# Patient Record
Sex: Male | Born: 1935
Health system: Southern US, Community
[De-identification: ages and names within clinical notes are randomized; demographics above are authoritative.]

## PROBLEM LIST (undated history)

## (undated) DIAGNOSIS — K219 Gastro-esophageal reflux disease without esophagitis: Secondary | ICD-10-CM

## (undated) DIAGNOSIS — H9319 Tinnitus, unspecified ear: Secondary | ICD-10-CM

## (undated) DIAGNOSIS — N4340 Spermatocele of epididymis, unspecified: Secondary | ICD-10-CM

## (undated) DIAGNOSIS — R413 Other amnesia: Secondary | ICD-10-CM

## (undated) DIAGNOSIS — F419 Anxiety disorder, unspecified: Secondary | ICD-10-CM

## (undated) DIAGNOSIS — I6381 Other cerebral infarction due to occlusion or stenosis of small artery: Secondary | ICD-10-CM

## (undated) DIAGNOSIS — F329 Major depressive disorder, single episode, unspecified: Secondary | ICD-10-CM

## (undated) DIAGNOSIS — D649 Anemia, unspecified: Secondary | ICD-10-CM

## (undated) DIAGNOSIS — I2510 Atherosclerotic heart disease of native coronary artery without angina pectoris: Secondary | ICD-10-CM

## (undated) DIAGNOSIS — M059 Rheumatoid arthritis with rheumatoid factor, unspecified: Secondary | ICD-10-CM

## (undated) DIAGNOSIS — Z860100 Personal history of colon polyps, unspecified: Secondary | ICD-10-CM

## (undated) DIAGNOSIS — I10 Essential (primary) hypertension: Secondary | ICD-10-CM

## (undated) DIAGNOSIS — R972 Elevated prostate specific antigen [PSA]: Secondary | ICD-10-CM

## (undated) DIAGNOSIS — I96 Gangrene, not elsewhere classified: Secondary | ICD-10-CM

## (undated) DIAGNOSIS — F410 Panic disorder [episodic paroxysmal anxiety] without agoraphobia: Secondary | ICD-10-CM

## (undated) DIAGNOSIS — E669 Obesity, unspecified: Secondary | ICD-10-CM

## (undated) DIAGNOSIS — K5790 Diverticulosis of intestine, part unspecified, without perforation or abscess without bleeding: Secondary | ICD-10-CM

## (undated) DIAGNOSIS — R251 Tremor, unspecified: Secondary | ICD-10-CM

## (undated) DIAGNOSIS — F32A Depression, unspecified: Secondary | ICD-10-CM

## (undated) DIAGNOSIS — C649 Malignant neoplasm of unspecified kidney, except renal pelvis: Secondary | ICD-10-CM

## (undated) DIAGNOSIS — B001 Herpesviral vesicular dermatitis: Secondary | ICD-10-CM

## (undated) DIAGNOSIS — G4700 Insomnia, unspecified: Secondary | ICD-10-CM

## (undated) DIAGNOSIS — E7800 Pure hypercholesterolemia, unspecified: Secondary | ICD-10-CM

## (undated) DIAGNOSIS — N189 Chronic kidney disease, unspecified: Secondary | ICD-10-CM

## (undated) DIAGNOSIS — C61 Malignant neoplasm of prostate: Secondary | ICD-10-CM

## (undated) DIAGNOSIS — M069 Rheumatoid arthritis, unspecified: Secondary | ICD-10-CM

## (undated) DIAGNOSIS — N400 Enlarged prostate without lower urinary tract symptoms: Secondary | ICD-10-CM

## (undated) HISTORY — DX: Depression, unspecified: F32.A

## (undated) HISTORY — DX: Atherosclerotic heart disease of native coronary artery without angina pectoris: I25.10

## (undated) HISTORY — DX: Personal history of colonic polyps: Z86.010

## (undated) HISTORY — DX: Malignant neoplasm of prostate: C61

## (undated) HISTORY — PX: COLONOSCOPY: SHX174

## (undated) HISTORY — DX: Herpesviral vesicular dermatitis: B00.1

## (undated) HISTORY — PX: BACK SURGERY: SHX140

## (undated) HISTORY — DX: Obesity, unspecified: E66.9

## (undated) HISTORY — DX: Major depressive disorder, single episode, unspecified: F32.9

## (undated) HISTORY — DX: Other cerebral infarction due to occlusion or stenosis of small artery: I63.81

## (undated) HISTORY — DX: Panic disorder (episodic paroxysmal anxiety): F41.0

## (undated) HISTORY — DX: Anxiety disorder, unspecified: F41.9

## (undated) HISTORY — DX: Anemia, unspecified: D64.9

## (undated) HISTORY — DX: Tinnitus, unspecified ear: H93.19

## (undated) HISTORY — DX: Personal history of colon polyps, unspecified: Z86.0100

## (undated) HISTORY — DX: Rheumatoid arthritis with rheumatoid factor, unspecified: M05.9

## (undated) HISTORY — DX: Tremor, unspecified: R25.1

## (undated) HISTORY — PX: AMPUTATION TOE: SHX6595

## (undated) HISTORY — DX: Chronic kidney disease, unspecified: N18.9

## (undated) HISTORY — DX: Insomnia, unspecified: G47.00

## (undated) HISTORY — DX: Spermatocele of epididymis, unspecified: N43.40

## (undated) HISTORY — DX: Diverticulosis of intestine, part unspecified, without perforation or abscess without bleeding: K57.90

## (undated) HISTORY — DX: Elevated prostate specific antigen (PSA): R97.20

---

## 1998-05-16 ENCOUNTER — Emergency Department (HOSPITAL_COMMUNITY): Admission: EM | Admit: 1998-05-16 | Discharge: 1998-05-16 | Payer: Self-pay | Admitting: Emergency Medicine

## 1998-09-20 ENCOUNTER — Ambulatory Visit (HOSPITAL_COMMUNITY): Admission: RE | Admit: 1998-09-20 | Discharge: 1998-09-20 | Payer: Self-pay | Admitting: Internal Medicine

## 1998-09-20 ENCOUNTER — Encounter: Payer: Self-pay | Admitting: Internal Medicine

## 1998-10-08 HISTORY — PX: NEPHRECTOMY: SHX65

## 1998-10-11 ENCOUNTER — Encounter: Payer: Self-pay | Admitting: Urology

## 1998-10-13 ENCOUNTER — Inpatient Hospital Stay (HOSPITAL_COMMUNITY): Admission: RE | Admit: 1998-10-13 | Discharge: 1998-10-17 | Payer: Self-pay | Admitting: Urology

## 1998-11-02 ENCOUNTER — Emergency Department (HOSPITAL_COMMUNITY): Admission: EM | Admit: 1998-11-02 | Discharge: 1998-11-02 | Payer: Self-pay | Admitting: Emergency Medicine

## 1999-09-17 ENCOUNTER — Emergency Department (HOSPITAL_COMMUNITY): Admission: EM | Admit: 1999-09-17 | Discharge: 1999-09-17 | Payer: Self-pay | Admitting: Emergency Medicine

## 1999-09-17 ENCOUNTER — Encounter: Payer: Self-pay | Admitting: Emergency Medicine

## 1999-10-12 ENCOUNTER — Ambulatory Visit (HOSPITAL_COMMUNITY): Admission: RE | Admit: 1999-10-12 | Discharge: 1999-10-12 | Payer: Self-pay | Admitting: Cardiology

## 1999-11-15 ENCOUNTER — Encounter: Admission: RE | Admit: 1999-11-15 | Discharge: 1999-11-15 | Payer: Self-pay | Admitting: Urology

## 1999-11-15 ENCOUNTER — Encounter: Payer: Self-pay | Admitting: Urology

## 2000-05-16 ENCOUNTER — Encounter: Payer: Self-pay | Admitting: Urology

## 2000-05-16 ENCOUNTER — Encounter: Admission: RE | Admit: 2000-05-16 | Discharge: 2000-05-16 | Payer: Self-pay | Admitting: Urology

## 2000-11-15 ENCOUNTER — Encounter: Admission: RE | Admit: 2000-11-15 | Discharge: 2000-11-15 | Payer: Self-pay | Admitting: Urology

## 2000-11-15 ENCOUNTER — Encounter: Payer: Self-pay | Admitting: Urology

## 2000-11-20 ENCOUNTER — Encounter: Payer: Self-pay | Admitting: Internal Medicine

## 2000-11-20 ENCOUNTER — Encounter: Admission: RE | Admit: 2000-11-20 | Discharge: 2000-11-20 | Payer: Self-pay | Admitting: Internal Medicine

## 2001-02-20 ENCOUNTER — Encounter: Payer: Self-pay | Admitting: Emergency Medicine

## 2001-02-20 ENCOUNTER — Emergency Department (HOSPITAL_COMMUNITY): Admission: EM | Admit: 2001-02-20 | Discharge: 2001-02-20 | Payer: Self-pay | Admitting: Emergency Medicine

## 2001-05-23 ENCOUNTER — Encounter: Admission: RE | Admit: 2001-05-23 | Discharge: 2001-05-23 | Payer: Self-pay | Admitting: Urology

## 2001-05-23 ENCOUNTER — Encounter: Payer: Self-pay | Admitting: Urology

## 2001-11-18 ENCOUNTER — Encounter: Admission: RE | Admit: 2001-11-18 | Discharge: 2001-11-18 | Payer: Self-pay | Admitting: Urology

## 2001-11-18 ENCOUNTER — Encounter: Payer: Self-pay | Admitting: Urology

## 2001-12-11 ENCOUNTER — Ambulatory Visit (HOSPITAL_COMMUNITY): Admission: RE | Admit: 2001-12-11 | Discharge: 2001-12-11 | Payer: Self-pay | Admitting: *Deleted

## 2001-12-11 ENCOUNTER — Encounter (INDEPENDENT_AMBULATORY_CARE_PROVIDER_SITE_OTHER): Payer: Self-pay | Admitting: Specialist

## 2002-02-26 ENCOUNTER — Ambulatory Visit (HOSPITAL_COMMUNITY): Admission: RE | Admit: 2002-02-26 | Discharge: 2002-02-26 | Payer: Self-pay | Admitting: *Deleted

## 2002-02-26 ENCOUNTER — Encounter (INDEPENDENT_AMBULATORY_CARE_PROVIDER_SITE_OTHER): Payer: Self-pay | Admitting: Specialist

## 2002-06-09 ENCOUNTER — Encounter: Payer: Self-pay | Admitting: Urology

## 2002-06-09 ENCOUNTER — Encounter: Admission: RE | Admit: 2002-06-09 | Discharge: 2002-06-09 | Payer: Self-pay | Admitting: Urology

## 2003-04-28 ENCOUNTER — Ambulatory Visit (HOSPITAL_COMMUNITY): Admission: RE | Admit: 2003-04-28 | Discharge: 2003-04-28 | Payer: Self-pay | Admitting: Urology

## 2003-04-28 ENCOUNTER — Encounter: Payer: Self-pay | Admitting: Urology

## 2006-02-07 ENCOUNTER — Encounter: Admission: RE | Admit: 2006-02-07 | Discharge: 2006-02-07 | Payer: Self-pay | Admitting: Internal Medicine

## 2009-10-24 ENCOUNTER — Encounter: Admission: RE | Admit: 2009-10-24 | Discharge: 2009-10-24 | Payer: Self-pay | Admitting: Internal Medicine

## 2009-12-20 ENCOUNTER — Emergency Department (HOSPITAL_COMMUNITY): Admission: EM | Admit: 2009-12-20 | Discharge: 2009-12-20 | Payer: Self-pay | Admitting: Emergency Medicine

## 2010-01-06 ENCOUNTER — Encounter: Admission: RE | Admit: 2010-01-06 | Discharge: 2010-01-06 | Payer: Self-pay | Admitting: Gastroenterology

## 2010-02-28 ENCOUNTER — Encounter: Admission: RE | Admit: 2010-02-28 | Discharge: 2010-02-28 | Payer: Self-pay | Admitting: Gastroenterology

## 2010-04-13 ENCOUNTER — Emergency Department (HOSPITAL_COMMUNITY): Admission: EM | Admit: 2010-04-13 | Discharge: 2010-04-14 | Payer: Self-pay | Admitting: Emergency Medicine

## 2010-10-29 ENCOUNTER — Encounter: Payer: Self-pay | Admitting: Cardiology

## 2010-12-24 LAB — POCT CARDIAC MARKERS
CKMB, poc: <1 ng/mL — ABNORMAL LOW (ref 1.0–8.0)
Myoglobin, poc: 128 ng/mL (ref 12–200)

## 2010-12-24 LAB — CBC
Hemoglobin: 11.8 g/dL — ABNORMAL LOW (ref 13.0–17.0)
MCHC: 34.3 g/dL (ref 30.0–36.0)
Platelets: 151 10*3/uL (ref 150–400)
RBC: 3.67 MIL/uL — ABNORMAL LOW (ref 4.22–5.81)
WBC: 6.4 10*3/uL (ref 4.0–10.5)

## 2010-12-24 LAB — COMPREHENSIVE METABOLIC PANEL
ALT: 15 U/L (ref 0–53)
Alkaline Phosphatase: 51 U/L (ref 39–117)
BUN: 18 mg/dL (ref 6–23)
Calcium: 9.4 mg/dL (ref 8.4–10.5)
Chloride: 106 mEq/L (ref 96–112)
Creatinine, Ser: 1.92 mg/dL — ABNORMAL HIGH (ref 0.4–1.5)
Glucose, Bld: 103 mg/dL — ABNORMAL HIGH (ref 70–99)
Total Bilirubin: 0.4 mg/dL (ref 0.3–1.2)

## 2010-12-24 LAB — DIFFERENTIAL
Lymphocytes Relative: 19 % (ref 12–46)
Lymphs Abs: 1.2 10*3/uL (ref 0.7–4.0)
Monocytes Absolute: 0.7 10*3/uL (ref 0.1–1.0)
Neutro Abs: 4.1 10*3/uL (ref 1.7–7.7)

## 2010-12-24 LAB — LIPASE, BLOOD: Lipase: 40 U/L (ref 11–59)

## 2011-01-01 LAB — CBC
Hemoglobin: 11.1 g/dL — ABNORMAL LOW (ref 13.0–17.0)
MCHC: 34.8 g/dL (ref 30.0–36.0)
MCV: 95.1 fL (ref 78.0–100.0)
RDW: 14 % (ref 11.5–15.5)
WBC: 7.4 10*3/uL (ref 4.0–10.5)

## 2011-01-01 LAB — POCT I-STAT, CHEM 8
HCT: 32 % — ABNORMAL LOW (ref 39.0–52.0)
Potassium: 4 mEq/L (ref 3.5–5.1)
TCO2: 24 mmol/L (ref 0–100)

## 2011-01-01 LAB — POCT CARDIAC MARKERS: Myoglobin, poc: 167 ng/mL (ref 12–200)

## 2011-01-01 LAB — DIFFERENTIAL
Basophils Absolute: 0 10*3/uL (ref 0.0–0.1)
Eosinophils Absolute: 0.5 10*3/uL (ref 0.0–0.7)
Eosinophils Relative: 7 % — ABNORMAL HIGH (ref 0–5)
Lymphs Abs: 2.4 10*3/uL (ref 0.7–4.0)
Monocytes Relative: 10 % (ref 3–12)
Neutrophils Relative %: 50 % (ref 43–77)

## 2011-01-01 LAB — APTT: aPTT: 30 seconds (ref 24–37)

## 2011-01-01 LAB — BASIC METABOLIC PANEL
CO2: 25 mEq/L (ref 19–32)
Calcium: 9.3 mg/dL (ref 8.4–10.5)
Sodium: 138 mEq/L (ref 135–145)

## 2011-01-01 LAB — PROTIME-INR: Prothrombin Time: 12.9 seconds (ref 11.6–15.2)

## 2011-02-23 NOTE — Procedures (Signed)
[facility]  Patient:    Derrick [name], Derrick [name] Visit Number: [id number] MRN: [id number]          Service Type: END Location: ENDO Attending Physician:  Sabino Gasser Dictated by:   Sabino Gasser, M.D. Proc. Date: 12/11/01 Admit Date:  12/11/2001                             Procedure Report  PROCEDURE:  Colonoscopy.  INDICATIONS:  Colon polyps.  ANESTHESIA:  Demerol 20, Versed 4 additional mg.  DESCRIPTION OF PROCEDURE:  With the patient mildly sedated in the left lateral decubitus position, a rectal exam was performed which was unremarkable. Subsequently, the Olympus videoscopic colonoscope was inserted into the rectum and passed easily under direct vision to the cecum, identified by the ileocecal valve and appendiceal orifice, both of which were photographed. From this point, the colonoscope was then slowly withdrawn, taking circumferential views of the entire colonic mucosa, stopping then only in the rectum which appeared initially normal on direct, and retroflexed view showed some hemorrhoidal tissue.  As we restraightened the endoscope, a small polyps was seen, photographed, and subsequently removed using hot biopsy forceps technique setting of 20-20 blended current. The endoscope was withdrawn.  The patients vital signs and pulse oximeter remained stable.  The patient tolerated the procedure well without apparent complications.  FINDINGS:  Polyp in the rectum.  PLAN:  Await biopsy report.  The patient will call me for results and follow up with me as an outpatient.  The hemorrhoids we will treat as needed. Dictated by:   Sabino Gasser, M.D. Attending Physician:  Sabino Gasser DD:  12/11/01 TD:  12/11/01 Job: [id number]

## 2011-02-23 NOTE — Procedures (Signed)
[facility]  Patient:    Derrick [name], Derrick [name] Visit Number: [id number] MRN: [id number]          Service Type: END Location: ENDO Attending Physician:  Sabino Gasser Dictated by:   Sabino Gasser, M.D. Proc. Date: 02/26/02 Admit Date:  02/26/2002                             Procedure Report  PROCEDURE:  Upper endoscopy.  INDICATION FOR PROCEDURE:  Follow-up with gastric ulcer.  ANESTHESIA:  Demerol 50, Versed 5 mg.  DESCRIPTION OF PROCEDURE:  With the patient mildly sedated in the left lateral decubitus position, the Olympus videoscopic endoscope was inserted in the mouth and passed under direct vision through the esophagus which appeared normal except for a questionable area of short segment Barretts esophagus which we had attempted to photograph and biopsy. We entered into the stomach. The fundus, body, antrum, duodenal bulb and second portion of the duodenum were visualized. From this point, the endoscope was slowly withdrawn taking circumferential views of the entire duodenal mucosa until the endoscope was then pulled back into the stomach, placed in retroflexion to view the stomach from below. The endoscope was then straightened and withdrawn taking circumferential views of the remaining gastric and esophageal mucosa stopping in the body of the stomach where erythema is seen and a measles-like distribution was seen, photographed and biopsied. The endoscope was withdrawn. The patients vital signs and pulse oximeter remained stable. The patient tolerated the procedure well without apparent complications.  FINDINGS:  Changes of erythema of body of stomach. Await biopsy report. Question of short segment Barretts esophagus. Again await biopsy report. The patient will call me for results and followup with me as an outpatient. Dictated by:   Sabino Gasser, M.D. Attending Physician:  Sabino Gasser DD:  02/26/02 TD:  02/28/02 Job: [id number]

## 2011-02-23 NOTE — Procedures (Signed)
[facility]  Patient:    Derrick [name], Derrick [name] Visit Number: [id number] MRN: [id number]          Service Type: END Location: ENDO Attending Physician:  Sabino Gasser Dictated by:   Sabino Gasser, M.D. Proc. Date: 12/11/01 Admit Date:  12/11/2001                             Procedure Report  PROCEDURE:  Upper endoscopy.  INDICATION:  GERD.  ANESTHESIA:  Demerol 40, Versed 6 mg.  DESCRIPTION OF PROCEDURE:  With patient mildly sedated in the left lateral decubitus position, the Olympus videoscopic endoscope was inserted into the mouth and passed under direct vision through the esophagus which appeared normal, into the stomach.  Fundus, body, and antrum were visualized, and diffuse erythema was seen consistent with a gastritis.  Duodenal bulb, second portion of duodenum appeared normal.  From this point, the endoscope was slowly withdrawn, taking circumferential views of the entire duodenal mucosa until the endoscope then pulled back into the stomach and placed in retroflexion to view the stomach from below.  This was photographed.  The endoscope was then straightened and withdrawn, taking circumferential views of the remaining gastric and esophageal mucosa, stopping in the body and fundus specifically to biopsy the diffuse erythematous changes noted.  The endoscope was withdrawn.  The patients vital signs and pulse oximeter remained stable. The patient tolerated the procedure well without apparent complications.  FINDINGS:  Diffuse gastritis mostly in body and fundus but also somewhat in the antrum.  PLAN:  Await biopsy report.  The patient will call me for results and follow up with me as an outpatient.  Proceed to colonoscopy as planned. Dictated by:   Sabino Gasser, M.D. Attending Physician:  Sabino Gasser DD:  12/11/01 TD:  12/11/01 Job: [id number]

## 2011-11-18 ENCOUNTER — Encounter (HOSPITAL_COMMUNITY): Payer: Self-pay | Admitting: Emergency Medicine

## 2011-11-18 ENCOUNTER — Emergency Department (HOSPITAL_COMMUNITY): Payer: Medicare Other

## 2011-11-18 ENCOUNTER — Inpatient Hospital Stay (HOSPITAL_COMMUNITY)
Admission: EM | Admit: 2011-11-18 | Discharge: 2011-11-20 | DRG: 287 | Disposition: A | Discharge status: Home / Self Care | Payer: Medicare Other | Attending: Internal Medicine | Admitting: Internal Medicine

## 2011-11-18 DIAGNOSIS — N183 Chronic kidney disease, stage 3 unspecified: Secondary | ICD-10-CM | POA: Diagnosis present

## 2011-11-18 DIAGNOSIS — I200 Unstable angina: Secondary | ICD-10-CM | POA: Diagnosis present

## 2011-11-18 DIAGNOSIS — K219 Gastro-esophageal reflux disease without esophagitis: Secondary | ICD-10-CM | POA: Diagnosis present

## 2011-11-18 DIAGNOSIS — E7800 Pure hypercholesterolemia, unspecified: Secondary | ICD-10-CM

## 2011-11-18 DIAGNOSIS — D696 Thrombocytopenia, unspecified: Secondary | ICD-10-CM | POA: Diagnosis present

## 2011-11-18 DIAGNOSIS — N400 Enlarged prostate without lower urinary tract symptoms: Secondary | ICD-10-CM | POA: Diagnosis present

## 2011-11-18 DIAGNOSIS — Z7982 Long term (current) use of aspirin: Secondary | ICD-10-CM

## 2011-11-18 DIAGNOSIS — Z79899 Other long term (current) drug therapy: Secondary | ICD-10-CM

## 2011-11-18 DIAGNOSIS — I10 Essential (primary) hypertension: Secondary | ICD-10-CM | POA: Diagnosis present

## 2011-11-18 DIAGNOSIS — R079 Chest pain, unspecified: Secondary | ICD-10-CM

## 2011-11-18 DIAGNOSIS — R9431 Abnormal electrocardiogram [ECG] [EKG]: Secondary | ICD-10-CM

## 2011-11-18 DIAGNOSIS — Z85528 Personal history of other malignant neoplasm of kidney: Secondary | ICD-10-CM

## 2011-11-18 DIAGNOSIS — D649 Anemia, unspecified: Secondary | ICD-10-CM | POA: Diagnosis present

## 2011-11-18 DIAGNOSIS — I129 Hypertensive chronic kidney disease with stage 1 through stage 4 chronic kidney disease, or unspecified chronic kidney disease: Secondary | ICD-10-CM | POA: Diagnosis present

## 2011-11-18 DIAGNOSIS — N189 Chronic kidney disease, unspecified: Secondary | ICD-10-CM | POA: Diagnosis present

## 2011-11-18 DIAGNOSIS — R0789 Other chest pain: Principal | ICD-10-CM | POA: Diagnosis present

## 2011-11-18 DIAGNOSIS — E785 Hyperlipidemia, unspecified: Secondary | ICD-10-CM | POA: Diagnosis present

## 2011-11-18 DIAGNOSIS — I2510 Atherosclerotic heart disease of native coronary artery without angina pectoris: Secondary | ICD-10-CM | POA: Diagnosis present

## 2011-11-18 HISTORY — DX: Benign prostatic hyperplasia without lower urinary tract symptoms: N40.0

## 2011-11-18 HISTORY — DX: Gastro-esophageal reflux disease without esophagitis: K21.9

## 2011-11-18 HISTORY — DX: Malignant neoplasm of unspecified kidney, except renal pelvis: C64.9

## 2011-11-18 HISTORY — DX: Essential (primary) hypertension: I10

## 2011-11-18 HISTORY — DX: Pure hypercholesterolemia, unspecified: E78.00

## 2011-11-18 LAB — HEPATIC FUNCTION PANEL
AST: 20 U/L (ref 0–37)
Albumin: 3.6 g/dL (ref 3.5–5.2)
Alkaline Phosphatase: 49 U/L (ref 39–117)
Total Bilirubin: 0.4 mg/dL (ref 0.3–1.2)
Total Protein: 7 g/dL (ref 6.0–8.3)

## 2011-11-18 LAB — CBC
HCT: 37.1 % — ABNORMAL LOW (ref 39.0–52.0)
MCHC: 34 g/dL (ref 30.0–36.0)
WBC: 5.8 10*3/uL (ref 4.0–10.5)

## 2011-11-18 LAB — DIFFERENTIAL
Basophils Relative: 0 % (ref 0–1)
Eosinophils Absolute: 0.3 10*3/uL (ref 0.0–0.7)
Lymphocytes Relative: 24 % (ref 12–46)
Lymphs Abs: 1.4 10*3/uL (ref 0.7–4.0)
Monocytes Relative: 8 % (ref 3–12)
Neutrophils Relative %: 63 % (ref 43–77)

## 2011-11-18 LAB — BASIC METABOLIC PANEL
BUN: 18 mg/dL (ref 6–23)
CO2: 23 mEq/L (ref 19–32)
Chloride: 103 mEq/L (ref 96–112)
GFR calc Af Amer: 43 mL/min — ABNORMAL LOW (ref >90)
GFR calc non Af Amer: 37 mL/min — ABNORMAL LOW (ref >90)
Potassium: 3.9 mEq/L (ref 3.5–5.1)

## 2011-11-18 LAB — URINALYSIS, ROUTINE W REFLEX MICROSCOPIC
Bilirubin Urine: NEGATIVE
Leukocytes, UA: NEGATIVE
Protein, ur: NEGATIVE mg/dL
Specific Gravity, Urine: 1.011 (ref 1.005–1.030)
Urobilinogen, UA: 0.2 mg/dL (ref 0.0–1.0)

## 2011-11-18 LAB — CARDIAC PANEL(CRET KIN+CKTOT+MB+TROPI)
CK, MB: 3.2 ng/mL (ref 0.3–4.0)
Total CK: 116 U/L (ref 7–232)

## 2011-11-18 MED ORDER — HEPARIN BOLUS VIA INFUSION
4000.0000 [IU] | Freq: Once | INTRAVENOUS | Status: AC
  Administered 2011-11-18: 4000 [IU] via INTRAVENOUS

## 2011-11-18 MED ORDER — HEPARIN SOD (PORCINE) IN D5W 100 UNIT/ML IV SOLN
1400.0000 [IU]/h | INTRAVENOUS | Status: DC
  Administered 2011-11-18: 1100 [IU]/h via INTRAVENOUS
  Filled 2011-11-18 (×4): qty 250

## 2011-11-18 MED ORDER — FINASTERIDE 5 MG PO TABS
5.0000 mg | ORAL_TABLET | Freq: Every day | ORAL | Status: DC
  Administered 2011-11-19 – 2011-11-20 (×2): 5 mg via ORAL
  Filled 2011-11-18 (×2): qty 1

## 2011-11-18 MED ORDER — SODIUM CHLORIDE 0.9 % IV SOLN
INTRAVENOUS | Status: DC
  Administered 2011-11-18: 10 mL/h via INTRAVENOUS

## 2011-11-18 MED ORDER — ONDANSETRON HCL 4 MG/2ML IJ SOLN: 4.0000 mg | Freq: Four times a day (QID) | INTRAMUSCULAR | Status: DC | PRN

## 2011-11-18 MED ORDER — DIAZEPAM 5 MG PO TABS
5.0000 mg | ORAL_TABLET | ORAL | Status: AC
  Administered 2011-11-18: 5 mg via ORAL
  Filled 2011-11-18: qty 1

## 2011-11-18 MED ORDER — NITROGLYCERIN 2 % TD OINT: 0.5000 [in_us] | TOPICAL_OINTMENT | Freq: Three times a day (TID) | TRANSDERMAL | Status: DC

## 2011-11-18 MED ORDER — ASPIRIN 81 MG PO CHEW
324.0000 mg | CHEWABLE_TABLET | Freq: Once | ORAL | Status: AC
Start: 2011-11-18 — End: 2011-11-18
  Administered 2011-11-18: 324 mg via ORAL
  Filled 2011-11-18: qty 4

## 2011-11-18 MED ORDER — SODIUM CHLORIDE 0.9 % IJ SOLN: 3.0000 mL | INTRAMUSCULAR | Status: DC | PRN

## 2011-11-18 MED ORDER — AMLODIPINE BESYLATE 5 MG PO TABS
5.0000 mg | ORAL_TABLET | Freq: Every day | ORAL | Status: DC
  Administered 2011-11-19 – 2011-11-20 (×2): 5 mg via ORAL
  Filled 2011-11-18 (×3): qty 1

## 2011-11-18 MED ORDER — SODIUM CHLORIDE 0.9 % IV SOLN
INTRAVENOUS | Status: DC
  Administered 2011-11-19: 04:00:00 via INTRAVENOUS

## 2011-11-18 MED ORDER — PANTOPRAZOLE SODIUM 40 MG PO TBEC
40.0000 mg | DELAYED_RELEASE_TABLET | Freq: Every day | ORAL | Status: DC
  Administered 2011-11-18: 40 mg via ORAL
  Filled 2011-11-18 (×2): qty 1

## 2011-11-18 MED ORDER — SODIUM CHLORIDE 0.9 % IJ SOLN
3.0000 mL | Freq: Two times a day (BID) | INTRAMUSCULAR | Status: DC
  Administered 2011-11-19: 3 mL via INTRAVENOUS

## 2011-11-18 MED ORDER — SODIUM CHLORIDE 0.9 % IV SOLN: 250.0000 mL | INTRAVENOUS | Status: DC | PRN

## 2011-11-18 MED ORDER — NITROGLYCERIN 0.4 MG/HR TD PT24
0.4000 mg | MEDICATED_PATCH | Freq: Every day | TRANSDERMAL | Status: DC
  Administered 2011-11-18 – 2011-11-19 (×2): 0.4 mg via TRANSDERMAL
  Filled 2011-11-18 (×2): qty 1

## 2011-11-18 MED ORDER — ACETAMINOPHEN 325 MG PO TABS: 650.0000 mg | ORAL_TABLET | ORAL | Status: DC | PRN

## 2011-11-18 MED ORDER — ASPIRIN EC 81 MG PO TBEC: 81.0000 mg | DELAYED_RELEASE_TABLET | Freq: Every day | ORAL | Status: DC

## 2011-11-18 MED ORDER — ROSUVASTATIN CALCIUM 5 MG PO TABS
5.0000 mg | ORAL_TABLET | Freq: Every day | ORAL | Status: DC
  Administered 2011-11-19 – 2011-11-20 (×2): 5 mg via ORAL
  Filled 2011-11-18 (×2): qty 1

## 2011-11-18 MED ORDER — ASPIRIN 81 MG PO CHEW
324.0000 mg | CHEWABLE_TABLET | ORAL | Status: AC
  Administered 2011-11-19: 324 mg via ORAL
  Filled 2011-11-18: qty 4

## 2011-11-18 MED ORDER — NITROGLYCERIN 0.4 MG SL SUBL
0.4000 mg | SUBLINGUAL_TABLET | SUBLINGUAL | Status: DC | PRN
  Administered 2011-11-18 (×2): 0.4 mg via SUBLINGUAL
  Filled 2011-11-18: qty 25

## 2011-11-18 NOTE — Consult Note (Signed)
[facility]  CARDIOLOGY CONSULTATION NOTE.  NAME:  Derrick [name]   MRN: [id number] DOB:  11-12-1931   ADMIT DATE: 11/18/2011  Reason for Consult: Chest pain - ACS  Requesting Physician: Lonia Blood, MD Primary Care Physician: Londell Moh, MD, MD  [facility] Primary Cardiologist: Marykay Lex, MD, MS  HPI: This is a 78 y.o. male with a past medical history significant for socially, renal cell carcinoma status post nephrectomy in 2000 with mild chronic renal insufficiency. Prior to today he notes increasing frequency/severity of exertional chest pain over the past few days, noting that  he may have been "slowing down" over the past few days walking the dog and but thought it was due to being out of shape since he had take a few days off of walking the dog.  -; pertinent negatives - claudication, dyspnea, fatigue, irregular heart beat, lower extremity edema, near-syncope, orthopnea, palpitations, paroxysmal nocturnal dyspnea, syncope and tachypnea. He was was in his usual state of health until this morning while at church she began noticing unusual discomfort in the left side of his chest going into his left arm that is just having nagging discomfort that was started off mild. He didn't feel right and then when he went home the it continued to get worse and began to feel more like a pressure more towards his left chest and still the strange sensation in his arm. He didn't really do much later he walked around to see if they get worse with exertion but after an hour or so of his continued symptoms he felt as though he need to be evaluated, so his wife brought him to the emergency room. His symptoms are relieved within 5 minutes of the second dose of nitroglycerin.  He noted that the episode began as I said about 11:00 in the morning lasted a few minutes, but then later on that evening home, the discomfort became worse to get about 8/10 and was  worse with deep inspiration feeling it with his hand on and under his left pectoral muscle. Since receiving the nitroglycerin in the emergency room he is not any further discomfort. Prior to arriving noted that the symptoms began slowly became progressively worse intermittently going away in a getting worse. As I mentioned he has been otherwise healthy with no recent illnesses.  PMHx:  CARDIAC HISTORY: He reports possibly having had a Heart Catheterization by Dr. Aleen Campi "a few years ago" (will need to check [facility] For records. Last Myoview 2007: Within normal limits. Echo: No reported Risk factors: Family History - CAD, Hypertension and Lipids Current cardiac medications: ACE inhibitor, ASA and HMG-CoA reductase inhibitor, dihydropyridine calcium channel blocker  Past Medical History  Diagnosis Date  . Hypertension   . High cholesterol   . GERD (gastroesophageal reflux disease)   . BPH (benign prostatic hyperplasia)   . Renal cell cancer     s/p nephrectomy in 2000   Past Surgical History  Procedure Date  . Nephrectomy 10/1998    FAMHx: Family History  Problem Relation Age of Onset  . Heart attack Father 3  . Kidney failure Sister     Bright's disease    SOCHx:  reports that he has quit smoking. He does not have any smokeless tobacco history on file. He reports that he does not drink alcohol or use illicit drugs.  ALLERGIES: No Known Allergies  ROS: Pertinent positives: chest pressure/discomfort, exertional chest pressure/discomfort and Shortness of breath A  comprehensive 10 point Review of Systems was negative except for: Ears, nose, mouth, throat, and face: positive for nasal congestion Respiratory: positive for cough and Occasional sensation of wheezing due to allergies Cardiovascular: positive for chest pressure/discomfort and exertional chest pressure/discomfort Gastrointestinal: positive for constipation, dyspepsia and reflux  symptoms Allergic/Immunologic: positive for Seasonal allergies   HOME MEDICATIONS: Prescriptions prior to admission  Medication Sig Dispense Refill  . amLODipine (NORVASC) 5 MG tablet Take 5 mg by mouth daily.      Marland Kitchen aspirin EC 81 MG tablet Take 81 mg by mouth daily.      . benazepril (LOTENSIN) 5 MG tablet Take 5 mg by mouth daily.      Marland Kitchen esomeprazole (NEXIUM) 40 MG capsule Take 40 mg by mouth 2 (two) times daily.      . finasteride (PROSCAR) 5 MG tablet Take 5 mg by mouth daily.      . fish oil-omega-3 fatty acids 1000 MG capsule Take 2 g by mouth 2 (two) times daily.      . rosuvastatin (CRESTOR) 5 MG tablet Take 5 mg by mouth daily.       HOSPITAL MEDICATIONS: I have reviewed the patient's current medications.  VITALS: Blood pressure 110/66, pulse 51, temperature 98.1 F (36.7 C), temperature source Oral, resp. rate 11, SpO2 97.00%. PHYSICAL EXAM: General appearance: alert, cooperative, appears stated age, no distress and Very pleasant mood and affect. Neck: no adenopathy, no carotid bruit, no JVD, supple, symmetrical, trachea midline and thyroid not enlarged, symmetric, no tenderness/mass/nodules Lungs: clear to auscultation bilaterally, normal percussion bilaterally and Nonlabored Heart: regular rate and rhythm, S1, S2 normal, no murmur, click, rub or gallop Abdomen: soft, non-tender; bowel sounds normal; no masses,  no organomegaly Extremities: extremities normal, atraumatic, no cyanosis or edema Pulses: 2+ and symmetric Skin: Skin color, texture, turgor normal. No rashes or lesions Neurologic: Mental status: Alert, oriented, thought content appropriate Cranial nerves: normal Motor: grossly normal  LABS: Results for orders placed during the hospital encounter of 11/18/11 (from the past 48 hour(s))  BASIC METABOLIC PANEL     Status: Abnormal   Collection Time   11/18/11  3:55 PM      Component Value Range Comment   Sodium 135  135 - 145 (mEq/L)    Potassium 3.9  3.5 -  5.1 (mEq/L)    Chloride 103  96 - 112 (mEq/L)    CO2 23  19 - 32 (mEq/L)    Glucose, Bld 132 (*) 70 - 99 (mg/dL)    BUN 18  6 - 23 (mg/dL)    Creatinine, Ser 2.13 (*) 0.50 - 1.35 (mg/dL)    Calcium 9.7  8.4 - 10.5 (mg/dL)    GFR calc non Af Amer 37 (*) >90 (mL/min)    GFR calc Af Amer 43 (*) >90 (mL/min)   CBC     Status: Abnormal   Collection Time   11/18/11  3:55 PM      Component Value Range Comment   WBC 5.8  4.0 - 10.5 (K/uL)    RBC 4.07 (*) 4.22 - 5.81 (MIL/uL)    Hemoglobin 12.6 (*) 13.0 - 17.0 (g/dL)    HCT 08.6 (*) 57.8 - 52.0 (%)    MCV 91.2  78.0 - 100.0 (fL)    MCH 31.0  26.0 - 34.0 (pg)    MCHC 34.0  30.0 - 36.0 (g/dL)    RDW 46.9  62.9 - 52.8 (%)    Platelets 135 (*) 150 - 400 (  K/uL)   DIFFERENTIAL     Status: Normal   Collection Time   11/18/11  3:55 PM      Component Value Range Comment   Neutrophils Relative 63  43 - 77 (%)    Neutro Abs 3.7  1.7 - 7.7 (K/uL)    Lymphocytes Relative 24  12 - 46 (%)    Lymphs Abs 1.4  0.7 - 4.0 (K/uL)    Monocytes Relative 8  3 - 12 (%)    Monocytes Absolute 0.5  0.1 - 1.0 (K/uL)    Eosinophils Relative 5  0 - 5 (%)    Eosinophils Absolute 0.3  0.0 - 0.7 (K/uL)    Basophils Relative 0  0 - 1 (%)    Basophils Absolute 0.0  0.0 - 0.1 (K/uL)   HEPATIC FUNCTION PANEL     Status: Normal   Collection Time   11/18/11  3:55 PM      Component Value Range Comment   Total Protein 7.0  6.0 - 8.3 (g/dL)    Albumin 3.6  3.5 - 5.2 (g/dL)    AST 20  0 - 37 (U/L)    ALT 23  0 - 53 (U/L)    Alkaline Phosphatase 49  39 - 117 (U/L)    Total Bilirubin 0.4  0.3 - 1.2 (mg/dL)    Bilirubin, Direct <1.6  0.0 - 0.3 (mg/dL)    Indirect Bilirubin NOT CALCULATED  0.3 - 0.9 (mg/dL)   LIPASE, BLOOD     Status: Normal   Collection Time   11/18/11  3:55 PM      Component Value Range Comment   Lipase 52  11 - 59 (U/L)   POCT I-STAT TROPONIN I     Status: Normal   Collection Time   11/18/11  4:07 PM      Component Value Range Comment   Troponin i,  poc 0.01  0.00 - 0.08 (ng/mL)    Comment 3            URINALYSIS, ROUTINE W REFLEX MICROSCOPIC     Status: Normal   Collection Time   11/18/11  5:13 PM      Component Value Range Comment   Color, Urine YELLOW  YELLOW     APPearance CLEAR  CLEAR     Specific Gravity, Urine 1.011  1.005 - 1.030     pH 6.5  5.0 - 8.0     Glucose, UA NEGATIVE  NEGATIVE (mg/dL)    Hgb urine dipstick NEGATIVE  NEGATIVE     Bilirubin Urine NEGATIVE  NEGATIVE     Ketones, ur NEGATIVE  NEGATIVE (mg/dL)    Protein, ur NEGATIVE  NEGATIVE (mg/dL)    Urobilinogen, UA 0.2  0.0 - 1.0 (mg/dL)    Nitrite NEGATIVE  NEGATIVE     Leukocytes, UA NEGATIVE  NEGATIVE  MICROSCOPIC NOT DONE ON URINES WITH NEGATIVE PROTEIN, BLOOD, LEUKOCYTES, NITRITE, OR GLUCOSE <1000 mg/dL.    IMAGING: Dg Chest 2 View  11/18/2011  *RADIOLOGY REPORT*  Clinical Data: Rule out infiltrate  CHEST - 2 VIEW  Comparison: 04/13/2010  Findings: Cardiomediastinal silhouette is stable.  No acute infiltrate or pleural effusion.  No pulmonary edema.  Mild degenerative changes mid and lower thoracic spine.  IMPRESSION: No active disease.  Mild degenerative changes thoracic spine.  Original Report Authenticated By: Natasha Mead, M.D.    IMPRESSION: Principal Problem:  *Unstable angina Active Problems:  Hypertension  High cholesterol  GERD (gastroesophageal reflux disease)  BPH (benign prostatic hyperplasia)  Chest pain at rest  Thrombocytopenia  Anemia  CKD (chronic kidney disease), stage III  My overall impression Mr. Mattioli symptoms are that they're quite classic symptoms for Unstable Angina/ACS. He does not have a family history of premature coronary artery disease but his brother whose effusion and a couple years ago had a heart attack and his father had an MI in his 55s. He has hypertension dyslipidemia and is a distant smoker. He does take daily aspirin. His ECG with T wave inversions leads V4 and V5 are concerning for ischemia although the kidney due  to LVH as there is a high voltage R waves in these leads. To calculate his TIMI risk score:  His risk factors include hypertension dyslipidemia and with a stretch family history as it was not CAD and a male less than 60. He takes daily aspirin he's had a couple episodes of angina in the last 24 hours and had ST deviation that was concerning for the dynamic T-wave inversions. This would make his TIMI risk score = 3.  We are still yet to see if he has any serum biomarker elevation.   Based on the classic nature of his anginal symptoms, his age and risk factors I feel that the most definitive to diagnose etiology of his chest discomfort would be to proceed with cardiac catheterization. I did spend at least 20 minutes talking to the patient about the risks benefits alternatives indications of the procedure especially in dorsi and the increased risk of contrast-induced nephropathy with his single kidney and chronic kidney disease. I do feel however that we can do diagnostic cardiac catheterization with minimal contrast ensuring units at hydration and be safe.  RECOMMENDATION: Cardiac cath to rule out ischemic CAD. Possible angioplasty. The procedure and risks described to patient including risk of CVA, MI, bleeding, emergency surgery, death, Contrast nephropathy. Risks / Complications include, but not limited to: Death, MI, CVA/TIA, VF/VT (with defibrillation), Bradycardia (need for temporary pacer placement), contrast induced nephropathy*, bleeding / bruising / hematoma / pseudoaneurysm, vascular or coronary injury (with possible emergent CT or Vascular Surgery), adverse medication reactions, infection.   The patient voices understanding and agree to proceed.     Agree with aspirin, IV heparin infusion for ACS protocol, statin. Continue ACE inhibitor. Beta blockers withheld because history of bradycardia.    Will make n.p.o. after midnight (may have a light breakfast) we'll plan to proceed with possible  catheterization tomorrow afternoon.  We'll order 2-D echocardiogram for [facility] to read in order to avoid the need for LV gram.   If cardiac catheterization demonstrate coronary disease, we will take the patient on the [facility].  We may need to consider staged diagnostic followed by interventional procedure to avoid excess contrast.  Time Spent Directly with Patient: 45 minutes  Brandye Inthavong W, M.D., M.S. [facility] [address] [place], [place]  [address]  [phone]  11/18/2011 8:17 PM   cc:   Dr. Corliss Skains; Londell Moh, MD, MD

## 2011-11-18 NOTE — ED Notes (Signed)
Return from xray

## 2011-11-18 NOTE — ED Notes (Signed)
Dr Lavera Guise here

## 2011-11-18 NOTE — H&P (Signed)
PCP:   Londell Moh, MD, MD   Chief Complaint:  Chest pain    HPI: 78 yo man with hx of HTN, HL presents with left side chest pain 8/10, for a few minutes at rest, worse with deep breathing radiating to the left arm, relieved by nitroglycerin in the ED. This is the second episode today. Last myoview 2007 WNL. No hx of cath , MI, CAD. Currently chest pain free. No recent acute illness    Review of Systems:  The patient denies anorexia, fever, weight loss,, vision loss, decreased hearing, hoarseness,  syncope, dyspnea on exertion, peripheral edema, balance deficits, hemoptysis, abdominal pain, melena, hematochezia, severe indigestion/heartburn, hematuria, incontinence, genital sores, muscle weakness, suspicious skin lesions, transient blindness, difficulty walking, depression, unusual weight change, abnormal bleeding, enlarged lymph nodes, .  Past Medical History: Past Medical History  Diagnosis Date  . Hypertension   . High cholesterol   . GERD (gastroesophageal reflux disease)   . BPH (benign prostatic hyperplasia)   . Renal cell cancer     s/p nephrectomy in 2000   Past Surgical History  Procedure Date  . Nephrectomy 10/1998    Medications: Prior to Admission medications   Medication Sig Start Date End Date Taking? Authorizing Provider  amLODipine (NORVASC) 5 MG tablet Take 5 mg by mouth daily.   Yes Historical Provider, MD  aspirin EC 81 MG tablet Take 81 mg by mouth daily.   Yes Historical Provider, MD  benazepril (LOTENSIN) 5 MG tablet Take 5 mg by mouth daily.   Yes Historical Provider, MD  esomeprazole (NEXIUM) 40 MG capsule Take 40 mg by mouth 2 (two) times daily.   Yes Historical Provider, MD  finasteride (PROSCAR) 5 MG tablet Take 5 mg by mouth daily.   Yes Historical Provider, MD  fish oil-omega-3 fatty acids 1000 MG capsule Take 2 g by mouth 2 (two) times daily.   Yes Historical Provider, MD  rosuvastatin (CRESTOR) 5 MG tablet Take 5 mg by mouth daily.   Yes  Historical Provider, MD    Allergies:  No Known Allergies  Social History:  reports that he has quit smoking. He does not have any smokeless tobacco history on file. He reports that he does not drink alcohol or use illicit drugs.  History   Social History Narrative   RetiredMarried4 children     Family History: Family History  Problem Relation Age of Onset  . Heart attack Father 39  . Kidney failure Sister     Bright's disease     Physical Exam: Filed Vitals:   11/18/11 1532 11/18/11 1615 11/18/11 1815  BP: 147/75 133/80 110/66  Pulse: 69 63 51  Temp: 98.1 F (36.7 C)    TempSrc: Oral    Resp: 20 13 11   SpO2: 96% 97% 97%   General appearance: alert, cooperative and appears stated age Head: Normocephalic, without obvious abnormality, atraumatic Eyes: conjunctivae/corneas clear. PERRL, EOM's intact. Fundi benign. Nose: Nares normal. Septum midline. Mucosa normal. No drainage or sinus tenderness. Throat: lips, mucosa, and tongue normal; teeth and gums normal Neck: no adenopathy, no carotid bruit, no JVD, supple, symmetrical, trachea midline and thyroid not enlarged, symmetric, no tenderness/mass/nodules Back: symmetric, no curvature. ROM normal. No CVA tenderness. Resp: clear to auscultation bilaterally Chest wall: no tenderness Cardio: regular rate and rhythm, S1, S2 normal, no murmur, click, rub or gallop GI: soft, non-tender; bowel sounds normal; no masses,  no organomegaly Extremities: extremities normal, atraumatic, no cyanosis or edema Pulses: 2+ and symmetric  Skin: Skin color, texture, turgor normal. No rashes or lesions Neurologic: Grossly normal   Labs on Admission:   [facility] 11/18/11 1555  NA 135  K 3.9  CL 103  CO2 23  GLUCOSE 132*  BUN 18  CREATININE 1.69*  CALCIUM 9.7  MG --  PHOS --    Basename 11/18/11 1555  AST 20  ALT 23  ALKPHOS 49  BILITOT 0.4  PROT 7.0  ALBUMIN 3.6    Basename 11/18/11 1555  LIPASE 52  AMYLASE --     Basename 11/18/11 1555  WBC 5.8  NEUTROABS 3.7  HGB 12.6*  HCT 37.1*  MCV 91.2  PLT 135*    Radiological Exams on Admission: Dg Chest 2 View  11/18/2011  *RADIOLOGY REPORT*  Clinical Data: Rule out infiltrate  CHEST - 2 VIEW  Comparison: 04/13/2010  Findings: Cardiomediastinal silhouette is stable.  No acute infiltrate or pleural effusion.  No pulmonary edema.  Mild degenerative changes mid and lower thoracic spine.  IMPRESSION: No active disease.  Mild degenerative changes thoracic spine.  Original Report Authenticated By: Natasha Mead, M.D.   EKG - sinus bradycardia, with negative T waves in V4-V5 -new  Assessment/Plan  78 yo man with unstable angina, TIMI score 4, admitted to telemetry. Plan for iv heparin, TD nitro, aspirin, continue to cycle cardiac enzymes. [facility] Dr. [name] consulted C/w PPI C/w statin  Hold acei and hydrate  For the mild anemia and thrombocytopenia will get B12 level and repeat CBC tomorrow  Present on Admission:  .Hypertension .High cholesterol .GERD (gastroesophageal reflux disease) .BPH (benign prostatic hyperplasia) .Chest pain at rest .Unstable angina .Thrombocytopenia .Anemia .CKD (chronic kidney disease), stage III  Ainslee Sou 11/18/2011, 7:02 PM

## 2011-11-18 NOTE — Progress Notes (Signed)
ANTICOAGULATION CONSULT NOTE - Initial Consult  Pharmacy Consult for heparin Indication: chest pain/ACS  No Known Allergies  Patient Measurements: Ht: 72 inches Wt: 91kg Heparin Dosing Weight: 91kg  Vital Signs: Temp: 98.1 F (36.7 C) (02/10 1532) Temp src: Oral (02/10 1532) BP: 110/66 mmHg (02/10 1815) Pulse Rate: 51  (02/10 1815)  Labs:  Basename 11/18/11 1555  HGB 12.6*  HCT 37.1*  PLT 135*  APTT --  LABPROT --  INR --  HEPARINUNFRC --  CREATININE 1.69*  CKTOTAL --  CKMB --  TROPONINI --   CrCl is unknown because there is no height on file for the current visit.  Medical History: Past Medical History  Diagnosis Date  . Hypertension   . High cholesterol   . GERD (gastroesophageal reflux disease)   . BPH (benign prostatic hyperplasia)   . Renal cell cancer     s/p nephrectomy in 2000    Assessment: 78 yo male to start heparin for CP  Goal of Therapy:  Heparin level 0.3-0.7 units/ml   Plan:  -Heparin bolus 4000 units followed by 1100 units/hr (~12 units/kg/hr) -Heparin level in 8 hrs and daily with CBC daily  Derrick [name] 11/18/2011,6:28 PM

## 2011-11-18 NOTE — ED Provider Notes (Signed)
History     CSN: [id number]  Arrival date & time 11/18/11  1521   First MD Initiated Contact with Patient 11/18/11 1540      Chief Complaint  Patient presents with  . Chest Pain    HPI Pt was seen at 1555.  Per pt and spouse, c/o gradual onset and persistence of constant chest "pain" that began at church approx 11am today.  Pt describes the discomfort as "dull," "tightness" and "heaviness."  Is located mostly left side of chest, worsens with exertion, improves with rest.  Has been assoc with SOB.  Denies hx of same, no back pain, no N/V/D, no abd pain, no palpitations, no cough.   Past Medical History  Diagnosis Date  . Hypertension   . High cholesterol     History reviewed. No pertinent past surgical history.   History  Substance Use Topics  . Smoking status: Former Games developer  . Smokeless tobacco: Not on file  . Alcohol Use: No    Review of Systems ROS: Statement: All systems negative except as marked or noted in the HPI; Constitutional: Negative for fever and chills. ; ; Eyes: Negative for eye pain, redness and discharge. ; ; ENMT: Negative for ear pain, hoarseness, nasal congestion, sinus pressure and sore throat. ; ; Cardiovascular: +CP, SOB. Negative for palpitations, diaphoresis, and peripheral edema. ; ; Respiratory: Negative for cough, wheezing and stridor. ; ; Gastrointestinal: Negative for nausea, vomiting, diarrhea, abdominal pain, blood in stool, hematemesis, jaundice and rectal bleeding. . ; ; Genitourinary: Negative for dysuria, flank pain and hematuria. ; ; Musculoskeletal: Negative for back pain and neck pain. Negative for swelling and trauma.; ; Skin: Negative for pruritus, rash, abrasions, blisters, bruising and skin lesion.; ; Neuro: Negative for headache, lightheadedness and neck stiffness. Negative for weakness, altered level of consciousness , altered mental status, extremity weakness, paresthesias, involuntary movement, seizure and syncope.       Allergies    Review of patient's allergies indicates no known allergies.  Home Medications   Current Outpatient Rx  Name Route Sig Dispense Refill  . AMLODIPINE BESYLATE 5 MG PO TABS Oral Take 5 mg by mouth daily.    . ASPIRIN EC 81 MG PO TBEC Oral Take 81 mg by mouth daily.    Marland Kitchen BENAZEPRIL HCL 5 MG PO TABS Oral Take 5 mg by mouth daily.    Marland Kitchen ESOMEPRAZOLE MAGNESIUM 40 MG PO CPDR Oral Take 40 mg by mouth 2 (two) times daily.    Marland Kitchen FINASTERIDE 5 MG PO TABS Oral Take 5 mg by mouth daily.    . OMEGA-3 FATTY ACIDS 1000 MG PO CAPS Oral Take 2 g by mouth 2 (two) times daily.    Marland Kitchen ROSUVASTATIN CALCIUM 5 MG PO TABS Oral Take 5 mg by mouth daily.      BP 133/80  Pulse 63  Temp(Src) 98.1 F (36.7 C) (Oral)  Resp 13  SpO2 97%  Physical Exam 1600: Physical examination:  Nursing notes reviewed; Vital signs and O2 SAT reviewed;  Constitutional: Well developed, Well nourished, Well hydrated, In no acute distress; Head:  Normocephalic, atraumatic; Eyes: EOMI, PERRL, No scleral icterus; ENMT: Mouth and pharynx normal, Mucous membranes moist; Neck: Supple, Full range of motion, No lymphadenopathy; Cardiovascular: Regular rate and rhythm, No murmur, rub, or gallop; Respiratory: Breath sounds clear & equal bilaterally, No rales, rhonchi, wheezes, or rub, Normal respiratory effort/excursion; Chest: Nontender, Movement normal; Abdomen: Soft, Nontender, Nondistended, Normal bowel sounds; Extremities: Pulses normal, No tenderness, No edema,  No calf edema or asymmetry.; Neuro: AA&Ox3, Major CN grossly intact. No facial droop, speech clear.  No gross focal motor or sensory deficits in extremities.; Skin: Color normal, Warm, Dry, no rash.    ED Course  Procedures    MDM  MDM Reviewed: nursing note and vitals Reviewed previous: ECG Interpretation: ECG, labs and x-ray    Date: 11/18/2011  Rate: 70  Rhythm: normal sinus rhythm  QRS Axis: normal  Intervals: normal  ST/T Wave abnormalities: nonspecific T wave changes,  flipped T-waves anterior and lateral leads  Conduction Disutrbances:nonspecific intraventricular conduction delay  Narrative Interpretation:   Old EKG Reviewed: changes noted; new flipped T-waves ant-lat leads compared to previous EKG dated 12/20/2009.  Results for orders placed during the hospital encounter of 11/18/11  BASIC METABOLIC PANEL      Component Value Range   Sodium 135  135 - 145 (mEq/L)   Potassium 3.9  3.5 - 5.1 (mEq/L)   Chloride 103  96 - 112 (mEq/L)   CO2 23  19 - 32 (mEq/L)   Glucose, Bld 132 (*) 70 - 99 (mg/dL)   BUN 18  6 - 23 (mg/dL)   Creatinine, Ser 1.61 (*) 0.50 - 1.35 (mg/dL)   Calcium 9.7  8.4 - 09.6 (mg/dL)   GFR calc non Af Amer 37 (*) >90 (mL/min)   GFR calc Af Amer 43 (*) >90 (mL/min)  CBC      Component Value Range   WBC 5.8  4.0 - 10.5 (K/uL)   RBC 4.07 (*) 4.22 - 5.81 (MIL/uL)   Hemoglobin 12.6 (*) 13.0 - 17.0 (g/dL)   HCT 04.5 (*) 40.9 - 52.0 (%)   MCV 91.2  78.0 - 100.0 (fL)   MCH 31.0  26.0 - 34.0 (pg)   MCHC 34.0  30.0 - 36.0 (g/dL)   RDW 81.1  91.4 - 78.2 (%)   Platelets 135 (*) 150 - 400 (K/uL)  DIFFERENTIAL      Component Value Range   Neutrophils Relative 63  43 - 77 (%)   Neutro Abs 3.7  1.7 - 7.7 (K/uL)   Lymphocytes Relative 24  12 - 46 (%)   Lymphs Abs 1.4  0.7 - 4.0 (K/uL)   Monocytes Relative 8  3 - 12 (%)   Monocytes Absolute 0.5  0.1 - 1.0 (K/uL)   Eosinophils Relative 5  0 - 5 (%)   Eosinophils Absolute 0.3  0.0 - 0.7 (K/uL)   Basophils Relative 0  0 - 1 (%)   Basophils Absolute 0.0  0.0 - 0.1 (K/uL)  URINALYSIS, ROUTINE W REFLEX MICROSCOPIC      Component Value Range   Color, Urine YELLOW  YELLOW    APPearance CLEAR  CLEAR    Specific Gravity, Urine 1.011  1.005 - 1.030    pH 6.5  5.0 - 8.0    Glucose, UA NEGATIVE  NEGATIVE (mg/dL)   Hgb urine dipstick NEGATIVE  NEGATIVE    Bilirubin Urine NEGATIVE  NEGATIVE    Ketones, ur NEGATIVE  NEGATIVE (mg/dL)   Protein, ur NEGATIVE  NEGATIVE (mg/dL)   Urobilinogen, UA 0.2   0.0 - 1.0 (mg/dL)   Nitrite NEGATIVE  NEGATIVE    Leukocytes, UA NEGATIVE  NEGATIVE   HEPATIC FUNCTION PANEL      Component Value Range   Total Protein 7.0  6.0 - 8.3 (g/dL)   Albumin 3.6  3.5 - 5.2 (g/dL)   AST 20  0 - 37 (U/L)   ALT 23  0 - 53 (U/L)   Alkaline Phosphatase 49  39 - 117 (U/L)   Total Bilirubin 0.4  0.3 - 1.2 (mg/dL)   Bilirubin, Direct <1.6  0.0 - 0.3 (mg/dL)   Indirect Bilirubin NOT CALCULATED  0.3 - 0.9 (mg/dL)  LIPASE, BLOOD      Component Value Range   Lipase 52  11 - 59 (U/L)  POCT I-STAT TROPONIN I      Component Value Range   Troponin i, poc 0.01  0.00 - 0.08 (ng/mL)   Comment 3            Results for EUDELL, MCPHEE (MRN [id number]) as of 11/18/2011 17:46  Ref. Range 04/13/2010 21:06 11/18/2011 15:55  BUN Latest Range: 6-23 mg/dL 18 18  Creat Latest Range: 0.50-1.35 mg/dL 9.81 (H) 1.91 (H)    Dg Chest 2 View 11/18/2011  *RADIOLOGY REPORT*  Clinical Data: Rule out infiltrate  CHEST - 2 VIEW  Comparison: 04/13/2010  Findings: Cardiomediastinal silhouette is stable.  No acute infiltrate or pleural effusion.  No pulmonary edema.  Mild degenerative changes mid and lower thoracic spine.  IMPRESSION: No active disease.  Mild degenerative changes thoracic spine.  Original Report Authenticated By: Natasha Mead, M.D.      Amos.Dom:  Pt states he feels better now after ASA and ntg sl.  Cr elevated, but improved from baseline.  Dx testing d/w pt and family.  Questions answered.  Verb understanding, agreeable to admit.  T/C to [facility], case discussed, including:  HPI, pertinent PM/SHx, VS/PE, dx testing, ED course and treatment.  Agreeable to admit.  Requests to obtain tele bed to team 4.             Laray Anger, DO 11/19/11 1839

## 2011-11-18 NOTE — ED Notes (Signed)
Patient transported to X-ray 

## 2011-11-18 NOTE — ED Notes (Signed)
C/o L sided chest pain that is worse with deep inspiration since 11am.  Denies nausea, vomiting, and sob.

## 2011-11-19 ENCOUNTER — Encounter (HOSPITAL_COMMUNITY)
Admission: EM | Disposition: A | Discharge status: Home / Self Care | Payer: Self-pay | Source: Home / Self Care | Attending: Internal Medicine

## 2011-11-19 ENCOUNTER — Other Ambulatory Visit: Payer: Self-pay

## 2011-11-19 HISTORY — PX: LEFT HEART CATHETERIZATION WITH CORONARY ANGIOGRAM: SHX5451

## 2011-11-19 LAB — LIPID PANEL
Cholesterol: 157 mg/dL (ref 0–200)
HDL: 62 mg/dL (ref >39)
Total CHOL/HDL Ratio: 2.5 RATIO

## 2011-11-19 LAB — PROTIME-INR: INR: 1.04 (ref 0.00–1.49)

## 2011-11-19 LAB — CBC
MCH: 31.9 pg (ref 26.0–34.0)
MCV: 90.5 fL (ref 78.0–100.0)
Platelets: 129 10*3/uL — ABNORMAL LOW (ref 150–400)
RDW: 13.3 % (ref 11.5–15.5)

## 2011-11-19 LAB — TSH: TSH: 2.208 u[IU]/mL (ref 0.350–4.500)

## 2011-11-19 LAB — CARDIAC PANEL(CRET KIN+CKTOT+MB+TROPI)
CK, MB: 2.6 ng/mL (ref 0.3–4.0)
Relative Index: INVALID (ref 0.0–2.5)
Total CK: 83 U/L (ref 7–232)

## 2011-11-19 LAB — BASIC METABOLIC PANEL
Calcium: 9.6 mg/dL (ref 8.4–10.5)
Creatinine, Ser: 1.58 mg/dL — ABNORMAL HIGH (ref 0.50–1.35)
GFR calc Af Amer: 47 mL/min — ABNORMAL LOW (ref >90)

## 2011-11-19 LAB — HEPARIN LEVEL (UNFRACTIONATED): Heparin Unfractionated: 0.23 IU/mL — ABNORMAL LOW (ref 0.30–0.70)

## 2011-11-19 LAB — URINE CULTURE
Colony Count: NO GROWTH
Culture  Setup Time: 201302102044

## 2011-11-19 SURGERY — LEFT HEART CATHETERIZATION WITH CORONARY ANGIOGRAM
Anesthesia: LOCAL

## 2011-11-19 MED ORDER — LIDOCAINE HCL (PF) 1 % IJ SOLN
INTRAMUSCULAR | Status: AC
  Filled 2011-11-19: qty 30

## 2011-11-19 MED ORDER — SODIUM CHLORIDE 0.9 % IV SOLN: 1.0000 mL/kg/h | INTRAVENOUS | Status: AC

## 2011-11-19 MED ORDER — ASPIRIN 81 MG PO CHEW
81.0000 mg | CHEWABLE_TABLET | Freq: Every day | ORAL | Status: DC
  Administered 2011-11-20: 81 mg via ORAL
  Filled 2011-11-19 (×2): qty 1

## 2011-11-19 MED ORDER — PANTOPRAZOLE SODIUM 40 MG PO TBEC
40.0000 mg | DELAYED_RELEASE_TABLET | Freq: Two times a day (BID) | ORAL | Status: DC
  Administered 2011-11-19 – 2011-11-20 (×3): 40 mg via ORAL
  Filled 2011-11-19 (×2): qty 1

## 2011-11-19 MED ORDER — MORPHINE SULFATE 2 MG/ML IJ SOLN: 1.0000 mg | INTRAMUSCULAR | Status: DC | PRN

## 2011-11-19 MED ORDER — ALPRAZOLAM 0.25 MG PO TABS: 0.2500 mg | ORAL_TABLET | Freq: Two times a day (BID) | ORAL | Status: DC | PRN

## 2011-11-19 MED ORDER — ASPIRIN 81 MG PO CHEW
CHEWABLE_TABLET | ORAL | Status: AC
  Filled 2011-11-19: qty 1

## 2011-11-19 MED ORDER — MIDAZOLAM HCL 2 MG/2ML IJ SOLN
INTRAMUSCULAR | Status: AC
  Filled 2011-11-19: qty 2

## 2011-11-19 MED ORDER — NITROGLYCERIN 0.2 MG/ML ON CALL CATH LAB
INTRAVENOUS | Status: AC
  Filled 2011-11-19: qty 1

## 2011-11-19 MED ORDER — ACETAMINOPHEN 325 MG PO TABS: 650.0000 mg | ORAL_TABLET | ORAL | Status: DC | PRN

## 2011-11-19 MED ORDER — SODIUM CHLORIDE 0.9 % IJ SOLN
3.0000 mL | Freq: Two times a day (BID) | INTRAMUSCULAR | Status: DC
  Administered 2011-11-20: 3 mL via INTRAVENOUS

## 2011-11-19 MED ORDER — HEPARIN SODIUM (PORCINE) 1000 UNIT/ML IJ SOLN
INTRAMUSCULAR | Status: AC
  Filled 2011-11-19: qty 1

## 2011-11-19 MED ORDER — VERAPAMIL HCL 2.5 MG/ML IV SOLN
INTRAVENOUS | Status: AC
  Filled 2011-11-19: qty 2

## 2011-11-19 MED ORDER — HEPARIN BOLUS VIA INFUSION
2000.0000 [IU] | Freq: Once | INTRAVENOUS | Status: AC
  Administered 2011-11-19: 2000 [IU] via INTRAVENOUS
  Filled 2011-11-19: qty 2000

## 2011-11-19 MED ORDER — HEPARIN (PORCINE) IN NACL 2-0.9 UNIT/ML-% IJ SOLN
INTRAMUSCULAR | Status: AC
  Filled 2011-11-19: qty 2000

## 2011-11-19 MED ORDER — FENTANYL CITRATE 0.05 MG/ML IJ SOLN
INTRAMUSCULAR | Status: AC
  Filled 2011-11-19: qty 2

## 2011-11-19 MED ORDER — SODIUM CHLORIDE 0.9 % IV SOLN: 250.0000 mL | INTRAVENOUS | Status: DC

## 2011-11-19 MED ORDER — ONDANSETRON HCL 4 MG/2ML IJ SOLN: 4.0000 mg | Freq: Four times a day (QID) | INTRAMUSCULAR | Status: DC | PRN

## 2011-11-19 MED ORDER — SODIUM CHLORIDE 0.9 % IJ SOLN: 3.0000 mL | INTRAMUSCULAR | Status: DC | PRN

## 2011-11-19 NOTE — Progress Notes (Signed)
Patient ID: Derrick [name], male   DOB: [birth year], 78 y.o.   MRN: [id number]  Subjective: No events overnight. Patient denies shortness of breath, abdominal pain. He denies chest pain at the moment.  Objective:  Vital signs in last 24 hours:  Filed Vitals:   12-09-11 1815 December 09, 2011 2019 11/19/11 0455 11/19/11 0500  BP: 110/66 152/87 113/65   Pulse: 51 60 55   Temp:  98 F (36.7 C) 98.5 F (36.9 C)   TempSrc:  Oral Oral   Resp: 11 15 16    Height:    6' (1.829 m)  Weight:    93.713 kg (206 lb 9.6 oz)  SpO2: 97% 95% 95%     Intake/Output from previous day:  No intake or output data in the 24 hours ending 11/19/11 0745  Physical Exam: General: Alert, awake, oriented x3, in no acute distress. HEENT: No bruits, no goiter. Moist mucous membranes, no scleral icterus, no conjunctival pallor. Heart: Regular rate and rhythm, S1/S2 +, no murmurs, rubs, gallops. Lungs: Clear to auscultation bilaterally. No wheezing, no rhonchi, no rales.  Abdomen: Soft, nontender, nondistended, positive bowel sounds. Extremities: No clubbing or cyanosis, no pitting edema,  positive pedal pulses. Neuro: Grossly nonfocal.  Lab Results:  Basic Metabolic Panel:    Component Value Date/Time   NA 135 11/19/2011 0217   K 3.8 11/19/2011 0217   CL 105 11/19/2011 0217   CO2 23 11/19/2011 0217   BUN 19 11/19/2011 0217   CREATININE 1.58* 11/19/2011 0217   GLUCOSE 106* 11/19/2011 0217   CALCIUM 9.6 11/19/2011 0217   CBC:    Component Value Date/Time   WBC 6.2 11/19/2011 0217   HGB 12.8* 11/19/2011 0217   HCT 36.3* 11/19/2011 0217   PLT 129* 11/19/2011 0217   MCV 90.5 11/19/2011 0217   NEUTROABS 3.7 12/09/2011 1555   LYMPHSABS 1.4 12-09-2011 1555   MONOABS 0.5 December 09, 2011 1555   EOSABS 0.3 12-09-2011 1555   BASOSABS 0.0 December 09, 2011 1555      Lab 11/19/11 0217 12/09/2011 1555  WBC 6.2 5.8  HGB 12.8* 12.6*  HCT 36.3* 37.1*  PLT 129* 135*  MCV 90.5 91.2  MCH 31.9 31.0  MCHC 35.3 34.0  RDW 13.3 13.1  LYMPHSABS --  1.4  MONOABS -- 0.5  EOSABS -- 0.3  BASOSABS -- 0.0  BANDABS -- --    Lab 11/19/11 0217 12-09-11 1555  NA 135 135  K 3.8 3.9  CL 105 103  CO2 23 23  GLUCOSE 106* 132*  BUN 19 18  CREATININE 1.58* 1.69*  CALCIUM 9.6 9.7  MG -- --   No results found for this basename: INR:5,PROTIME:5 in the last 168 hours Cardiac markers:  Lab 11/19/11 0213 Dec 09, 2011 2017  CKMB 2.9 3.2  TROPONINI <0.30 <0.30  MYOGLOBIN -- --   Studies/Results: Dg Chest 2 View 12/09/2011  IMPRESSION: No active disease.  Mild degenerative changes thoracic spine.    Medications: Scheduled Meds:   . amLODipine  5 mg Oral Daily  . aspirin  324 mg Oral Once  . aspirin  324 mg Oral Pre-Cath  . diazepam  5 mg Oral On Call  . finasteride  5 mg Oral Daily  . heparin  2,000 Units Intravenous Once  . heparin  4,000 Units Intravenous Once  . nitroGLYCERIN  0.4 mg Transdermal Daily  . pantoprazole  40 mg Oral Daily  . rosuvastatin  5 mg Oral Daily  . sodium chloride  3 mL Intravenous Q12H  . DISCONTD: aspirin  EC  81 mg Oral Daily  . DISCONTD: aspirin EC  81 mg Oral Daily  . DISCONTD: nitroGLYCERIN  0.5 inch Topical Q8H   Continuous Infusions:   . sodium chloride 10 mL/hr (11/18/11 2035)  . sodium chloride 100 mL/hr at 11/19/11 0400  . heparin 1,400 Units/hr (11/19/11 0333)   PRN Meds:.sodium chloride, acetaminophen, nitroGLYCERIN, ondansetron (ZOFRAN) IV, sodium chloride  Assessment/Plan:  Principal Problem:  *Unstable angina - 2 sets of cardiac enzymes are negative - lipid panel is within normal limits - plan is to proceed with Cath this afternoon - will follow up on results - appreciate cardiology involvement in pt's care  Active Problems:  Hypertension - remains stable over 24 hour period   Chest pain at rest - worrisome for unstable angina/ACS - will continue ASA and Heparin per ACS protocol - Cath scheduled for afternoon today   CKD (chronic kidney disease), stage III - creatinine remains  stable and at pt's baseline   High cholesterol - LDL is at goal - will need to continue statin   GERD (gastroesophageal reflux disease) - stable   Thrombocytopenia - appears to be around pt's baseline - no active sign of bleeding   Anemia - Hg/Hct stable - will obtain CBC in AM   EDUCATION - test results and diagnostic studies were discussed with patient  - patient verbalized the understanding - questions were answered at the bedside and contact information was provided for additional questions or concerns   LOS: 1 day   MAGICK-Bintou Lafata 11/19/2011, 7:45 AM  [facility] Pager: [phone]

## 2011-11-19 NOTE — Progress Notes (Signed)
[facility]  Subjective: No CP , radiation or SOB.   Objective: Vital signs in last 24 hours: Temp:  [98 F (36.7 C)-98.5 F (36.9 C)] 98.5 F (36.9 C) (02/11 0455) Pulse Rate:  [51-69] 55  (02/11 0455) Resp:  [11-20] 16  (02/11 0455) BP: (110-152)/(65-87) 113/65 mmHg (02/11 0455) SpO2:  [95 %-97 %] 95 % (02/11 0455) Weight:  [93.713 kg (206 lb 9.6 oz)] 93.713 kg (206 lb 9.6 oz) (02/11 0500) Last BM Date: 11/18/11  Intake/Output from previous day:   Intake/Output this shift:   Medications Current Facility-Administered Medications  Medication Dose Route Frequency Provider Last Rate Last Dose  . 0.9 %  sodium chloride infusion   Intravenous Continuous Sorin Laza, MD 10 mL/hr at 11/18/11 2035 10 mL/hr at 11/18/11 2035  . 0.9 %  sodium chloride infusion  250 mL Intravenous PRN Marykay Lex, MD      . 0.9 %  sodium chloride infusion   Intravenous Continuous Marykay Lex, MD 100 mL/hr at 11/19/11 0400    . acetaminophen (TYLENOL) tablet 650 mg  650 mg Oral Q4H PRN Sorin Lavera Guise, MD      . amLODipine (NORVASC) tablet 5 mg  5 mg Oral Daily Sorin Laza, MD      . aspirin chewable tablet 324 mg  324 mg Oral Once Laray Anger, DO   324 mg at 11/18/11 1635  . aspirin chewable tablet 324 mg  324 mg Oral Pre-Cath Marykay Lex, MD   324 mg at 11/19/11 0529  . diazepam (VALIUM) tablet 5 mg  5 mg Oral On Call Marykay Lex, MD   5 mg at 11/18/11 2226  . finasteride (PROSCAR) tablet 5 mg  5 mg Oral Daily Sorin Laza, MD      . heparin ADULT infusion 100 units/ml (25000 units/250 ml)  1,400 Units/hr Intravenous Continuous Iskra Magick-Myers, MD 14 mL/hr at 11/19/11 0333 1,400 Units/hr at 11/19/11 0333  . heparin bolus via infusion 2,000 Units  2,000 Units Intravenous Once Debbora Presto, MD   2,000 Units at 11/19/11 0345  . heparin bolus via infusion 4,000 Units  4,000 Units Intravenous Once Benny Lennert, PHARMD   4,000 Units at 11/18/11 2034  .  nitroGLYCERIN (NITRODUR - Dosed in mg/24 hr) patch 0.4 mg  0.4 mg Transdermal Daily Sorin Laza, MD   0.4 mg at 11/18/11 2216  . nitroGLYCERIN (NITROSTAT) SL tablet 0.4 mg  0.4 mg Sublingual Q5 min PRN Laray Anger, DO   0.4 mg at 11/18/11 1646  . ondansetron (ZOFRAN) injection 4 mg  4 mg Intravenous Q6H PRN Sorin Lavera Guise, MD      . pantoprazole (PROTONIX) EC tablet 40 mg  40 mg Oral BID AC Iskra Magick-Myers, MD      . rosuvastatin (CRESTOR) tablet 5 mg  5 mg Oral Daily Sorin Laza, MD      . sodium chloride 0.9 % injection 3 mL  3 mL Intravenous Q12H Marykay Lex, MD      . sodium chloride 0.9 % injection 3 mL  3 mL Intravenous PRN Marykay Lex, MD      . DISCONTD: aspirin EC tablet 81 mg  81 mg Oral Daily Sorin Lavera Guise, MD      . DISCONTD: aspirin EC tablet 81 mg  81 mg Oral Daily Sorin Laza, MD      . DISCONTD: nitroGLYCERIN (NITROGLYN) 2 % ointment 0.5 inch  0.5 inch Topical Q8H Sorin  Lavera Guise, MD      . DISCONTD: pantoprazole (PROTONIX) EC tablet 40 mg  40 mg Oral Daily Sorin Lavera Guise, MD   40 mg at 11/18/11 2226    PE: General appearance: alert, cooperative and no distress Lungs: clear to auscultation bilaterally Heart: Slow rate, Reg rhythm, S1, S2 normal, no murmur, click, rub or gallop Extremities: No LEE Pulses: 2+ and symmetric Abdomen: Soft nontender, nondistended with normal active bowel sounds   Lab Results:   Basename 11/19/11 0217 11/18/11 1555  WBC 6.2 5.8  HGB 12.8* 12.6*  HCT 36.3* 37.1*  PLT 129* 135*   BMET  Basename 11/19/11 0217 11/18/11 1555  NA 135 135  K 3.8 3.9  CL 105 103  CO2 23 23  GLUCOSE 106* 132*  BUN 19 18  CREATININE 1.58* 1.69*  CALCIUM 9.6 9.7   PT/INR  Basename 11/19/11 0851  LABPROT 13.8  INR 1.04   Cholesterol  Basename 11/19/11 0217  CHOL 157   Cardiac Enzymes Troponin I <0.30,  <0.30,  <0.30 CK, MB 3.2,  2.9,  2.6  Assessment/Plan  Principal Problem:  *Unstable angina Active Problems:  Hypertension  High  cholesterol  GERD (gastroesophageal reflux disease)  BPH (benign prostatic hyperplasia)  Chest pain at rest  Thrombocytopenia  Anemia  CKD (chronic kidney disease), stage III  Plan:  Pain free.  EKG with T wave inversion in V3-6 more pronounced than yesterday. Sinus Brady Cardiac enzymes negative.  For left heart cath today. BP 110/66 - 152/87. Last 113/65.  Cr:1.58.    LOS: 1 day    HAGER,BRYAN W 11/19/2011 9:56 AM  I seen and evaluated the patient this afternoon following Mr. Hager.  I examined the chart and results and agree to the T wave inversions in the lateral leads are much more concerning today than they were yesterday. The troponin has remained negative, however with his concerning symptoms still feel it is best served with diagnostic cardiac catheterization which will be performed today.  Actually his creatinine is acting better today than it was yesterday.  He has had an echocardiogram done today which we'll review. This will preclude the need to do the ventriculogram.  Marykay Lex, M.D., M.S. [facility] [address] [place], [place]  [address]  [phone]  11/19/2011 2:48 PM

## 2011-11-19 NOTE — Progress Notes (Signed)
  Echocardiogram 2D Echocardiogram has been performed.  Derrick [name] 11/19/2011, 1:56 PM

## 2011-11-19 NOTE — Progress Notes (Signed)
ANTICOAGULATION CONSULT NOTE - Initial Consult  Pharmacy Consult for heparin Indication: chest pain/ACS  No Known Allergies  Patient Measurements: Ht: 72 inches Wt: 91kg Heparin Dosing Weight: 91kg  Vital Signs: Temp: 98 F (36.7 C) (02/10 2019) Temp src: Oral (02/10 2019) BP: 152/87 mmHg (02/10 2019) Pulse Rate: 60  (02/10 2019)  Labs:  Basename 11/19/11 0217 11/19/11 0213 11/18/11 2017 11/18/11 1555  HGB 12.8* -- -- 12.6*  HCT 36.3* -- -- 37.1*  PLT 129* -- -- 135*  APTT -- -- -- --  LABPROT -- -- -- --  INR -- -- -- --  HEPARINUNFRC 0.23* -- -- --  CREATININE 1.58* -- -- 1.69*  CKTOTAL -- 95 116 --  CKMB -- 2.9 3.2 --  TROPONINI -- <0.30 <0.30 --   CrCl is unknown because there is no height on file for the current visit.  Assessment: 78 yo male with ACS for Heparin  Goal of Therapy:  Heparin level 0.3-0.7 units/ml   Plan:  Heparin 2000 units IV bolus, then increase Heparin 1400 units/hr.   F/U after cath.  Eddie Candle 11/19/2011,3:29 AM

## 2011-11-19 NOTE — Brief Op Note (Signed)
11/18/2011 - 11/19/2011  3:46 PM  PATIENT:  Derrick [name]  78 y.o. male (patient Dr. Jacinto Halim) with a history of HTN, HLD who presented with progressively worsening L sided CP radiating to the L Arm on 11/18/11.  ECG noted to have Lateral TWI concerning for ischemia.  SSx thought to be consistent with Unstable Angina.  He was referred for invasive cardiac evaluation by cardiac catheterization.  PRE-OPERATIVE DIAGNOSIS:  Cp ; Unstable Angina  POST-OPERATIVE DIAGNOSIS:  Non-obstructive coronary disease  RCA has mid focal 40-50% lesion just prior to her marginal branch. The RCA continues on to give and extensive PDA the small caliber and are small posterior lateral system. No other significant coronary disease in this vessel position or maybe 20% lesion in just before the bifurcation.  Left Main large caliber bifurcates into LAD and Left Circumflex, angiographically normal  LAD is a moderate to large caliber vessel which done around the apex. It gives rise to a large septal trunk followed by 2 diagonal branches. Just between the septal trunk and the first diagonal branch there is a concentric tubular 40% calcified lesion that does involve the ostium of the First Diagonal branch with about 20% lesion this vessel. The mid LAD as necessary disease nor does it either of the diagonal branches.  Left Circumflex: Marked large caliber vessel gives rise to a moderate caliber high/proximal OM and is relatively small AV groove artery then terminates in a but bifurcating obtuse marginal which eventually resulted in OM 2,3 and 4 distribution.  Angiographically normal.  Central aortic pressure 88/53 mmHg in: 68 mm Hg mean.  Aortic valve was crossed but hemodynamics were not obtained. Ventriculography was not performed in order to conserve contrast.   PROCEDURE:  Procedure(s): CORONARY ANGIOGRAPHY only  Surgeon(s): Marykay Lex, MD  ANESTHESIA:   regional, IV sedation and Oral Valium 5mg  premed.; Versed  1 mg, fentanyl 25 mcg    EBL:  Less than 10 mL  EQUIPMENT: 5 French Right Radial Artery Glide Sheath; 5 French TIG 4.0 catheter  LOCAL MEDICATIONS USED:  LIDOCAINE 2 mL  TOURNIQUET:  TR band -- 10 mL; 1526 hours   DICTATION: .Note written in EPIC  PLAN OF CARE: Admit for overnight observation  Will monitor overnight to allow for postcatheterization hydration.  We will notify the primary team results.  PATIENT DISPOSITION:  PACU - hemodynamically stable.   Delay start of Pharmacological VTE agent (>24hrs) due to surgical blood loss or risk of bleeding: not applicable  Cc: Londell Moh, MD, MD

## 2011-11-19 NOTE — Progress Notes (Signed)
11/19/2011 Derrick [name] [facility] Case Management Note [phone]  Utilization review completed.  

## 2011-11-20 LAB — BASIC METABOLIC PANEL
BUN: 19 mg/dL (ref 6–23)
Calcium: 9.6 mg/dL (ref 8.4–10.5)
Creatinine, Ser: 1.53 mg/dL — ABNORMAL HIGH (ref 0.50–1.35)
GFR calc non Af Amer: 42 mL/min — ABNORMAL LOW (ref >90)
Glucose, Bld: 116 mg/dL — ABNORMAL HIGH (ref 70–99)

## 2011-11-20 MED ORDER — ALPRAZOLAM 0.25 MG PO TABS: 0.2500 mg | ORAL_TABLET | Freq: Two times a day (BID) | ORAL | Status: AC | PRN

## 2011-11-20 NOTE — Discharge Instructions (Signed)
Acute Coronary Syndrome °Acute coronary syndrome (ACS) is an urgent problem in which the blood and oxygen supply to the heart is critically deficient. ACS requires hospitalization because one or more coronary arteries may be blocked. °ACS represents a range of conditions including: °· Previous angina that is now unstable, lasts longer, happens at rest, or is more intense.  °· A heart attack, with heart muscle cell injury and death.  °There are three vital coronary arteries that supply the heart muscle with blood and oxygen so that it can pump blood effectively. If blockages to these arteries develop, blood flow to the heart muscle is reduced. If the heart does not get enough blood, angina may occur as the first warning sign. °SYMPTOMS  °· The most common signs of angina include:  °· Tightness or squeezing in the chest.  °· Feeling of heaviness on the chest.  °· Discomfort in the arms, neck, or jaw.  °· Shortness of breath and nausea.  °· Cold, wet skin.  °· Angina is usually brought on by physical effort or excitement which increase the oxygen needs of the heart. These states increase the blood flow needs of the heart beyond what can be delivered.  °TREATMENT  °· Medicines to help discomfort may include nitroglycerin (nitro) in the form of tablets or a spray for rapid relief, or longer-acting forms such as cream, patches, or capsules. (Be aware that there are many side effects and possible interactions with other drugs).  °· Other medicines may be used to help the heart pump better.  °· Procedures to open blocked arteries including angioplasty or stent placement to keep the arteries open.  °· Open heart surgery may be needed when there are many blockages or they are in critical locations that are best treated with surgery.  °HOME CARE INSTRUCTIONS  °· Avoid smoking.  °· Take one baby or adult aspirin daily, if your caregiver advises. This helps reduce the risk of a heart attack.  °· It is very important that you  follow the angina treatment prescribed by your caregiver. Make arrangements for proper follow-up care.  °· Eat a heart healthy diet with salt and fat restrictions as advised.  °· Regular exercise is good for you as long as it does not cause discomfort. Do not begin any new type of exercise until you check with your caregiver.  °· If you are overweight, you should lose weight.  °· Try to maintain normal blood lipid levels.  °· Keep your blood pressure under control as recommended by your caregiver.  °· You should tell your caregiver right away about any increase in the severity or frequency of your chest discomfort or angina attacks. When you have angina, you should stop what you are doing and sit down. This may bring relief in 3 to 5 minutes. If your caregiver has prescribed nitro, take it as directed.  °· If your caregiver has given you a follow-up appointment, it is very important to keep that appointment. Not keeping the appointment could result in a chronic or permanent injury, pain, and disability. If there is any problem keeping the appointment, you must call back to this facility for assistance.  °SEEK IMMEDIATE MEDICAL CARE IF:  °· You develop nausea, vomiting, or shortness of breath.  °· You feel faint, lightheaded, or pass out.  °· Your chest discomfort gets worse.  °· You are sweating or experience sudden profound fatigue.  °· You do not get relief of your chest pain after 3 doses   of nitro.  °· Your discomfort lasts longer than 15 minutes.  °MAKE SURE YOU:  °· Understand these instructions.  °· Will watch your condition.  °· Will get help right away if you are not doing well or get worse.  °Document Released: 09/24/2005 Document Revised: 06/06/2011 Document Reviewed: 04/27/2008 °[employer]® Patient Information ©2012 [employer].Angina °Angina is discomfort caused by inadequate oxygen delivery to the heart muscle. Angina is a response to blockage or narrowing of the coronary arteries. It alerts you about a  blood flow problem to your heart. New, more frequent, or severe angina is a warning signal for you to seek medical care right away.  °CAUSES °The coronary arteries supply blood and oxygen to the heart muscle. The heart muscle needs a constant supply of oxygen in order to pump blood to the body. These arteries often become blocked by hardened deposits of blood products (plaque) including fat and cholesterol. The following contribute to the formation of plaque: °· High cholesterol.  °· High blood pressure.  °· Smoking.  °· Obesity.  °· Diabetes.  °SYMPTOMS  °· The most common problem is a deep discomfort in the center of your chest. The discomfort may also be felt in, or move to your:  °· Arms (especially the left one).  °· Throat.  °· Jaw.  °· Back.  °· Upper stomach.  °· Angina may be brought on by exercise, emotional upset, heavy meals, or extremes of heat or cold.  °· It may resolve within 5 to 10 minutes after a period of rest.  °· Angina symptoms vary from person to person.  °· If you have angina and it is treated, it may resolve. If you feel it again, the symptoms may not be the same in both type and location.  °DIAGNOSIS  °· Emergency room evaluation or hospital admission may be needed to determine if there are any blockages of your coronary arteries.  °· Blood tests, EKGs, and chest X-rays may be done.  °· Further testing may include a stress test or an angiogram.  °· A heart specialist (cardiologist) may be asked to assist with your evaluation.  °· Other conditions that may feel like angina, but are not a problem with the heart include:  °· Muscle strain in the chest wall.  °· Blood clots in the lung.  °· Anxiety.  °· Acid reflux from the stomach.  °RISKS AND COMPLICATIONS  °Angina that is not treated or evaluated can lead to a decline in oxygen delivery to the heart muscle, a heart attack, or even death. °TREATMENT  °Depending on severity of the blockages and on other factors, angina may be treated  with: °· Lifestyle changes such as weight loss, stopping smoking, appropriate exercise, or low cholesterol and low salt diet.  °· Medications to control or to treat the risk factors for angina.  °· Procedures such as angioplasty or stent placement may allow the blockages to be opened without surgery.  °· Open heart surgery may be needed to bypass blocked arteries that cannot be treated by other methods.  °HOME CARE INSTRUCTIONS  °· If your caregiver prescribed medication to control your angina, take them as directed. Report side effects. Do not stop medications or adjust the dosages on your own.  °· Regular exercise is good for you as long as it does not cause discomfort. Avoid activities that trigger attacks of angina. Walking is the best exercise. Do not begin any new type of exercise until you check with your caregiver.  °·   You may still have a sexual relationship if it does not cause angina. Tell your caregiver if it does.  °· Stop smoking. Do not use nicotine patches or gum until you check with your caregiver.  °· If you are overweight, you should lose weight. Eat a heart healthy diet that is low in fat and salt.  °· If your caregiver has given you a follow-up appointment, it is very important to keep that appointment. Not keeping the appointment could result in a heart attack, permanent heart damage, and disability. If there is any problem keeping the appointment, you must call back to this facility for assistance.  °SEEK MEDICAL CARE IF:  °· Your angina seems to be occurring more frequently or seems to be lasting longer.  °· You are having problems that you think may be side effects of the medicine you are taking.  °SEEK IMMEDIATE MEDICAL CARE IF:  °· You have severe chest discomfort, especially if the pain is crushing or pressure-like and spreads to the arms, back, neck, or jaw.  °· You are sweating, feel sick to your stomach (nauseous), or have shortness of breath.  °· You have an attack of angina that does  not get better after rest or taking your usual medicine.  °· You wake from sleep with chest pain.  °· You feel dizzy, faint, or experience extreme fatigue.  °· You have chest pain not typical of your usual angina. THIS IS AN EMERGENCY. Do not wait to see if the pain will go away. Get medical help at once. Call 911. DO NOT drive yourself to the hospital.  °MAKE SURE YOU:  °· Understand these instructions.  °· Will watch your condition.  °· Will get help right away if you are not doing well or get worse.  °Document Released: 09/24/2005 Document Revised: 06/06/2011 Document Reviewed: 04/27/2008 °[employer]® Patient Information ©2012 [employer]. °

## 2011-11-20 NOTE — Discharge Summary (Signed)
Patient ID: Derrick [name] MRN: [id number] DOB/AGE: September 01, 1932 78 y.o.  Admit date: 11/18/2011 Discharge date: 11/20/2011  Primary Care Physician:  Londell Moh, MD, MD  Discharge Diagnoses:    Present on Admission:  .Hypertension .High cholesterol .GERD (gastroesophageal reflux disease) .BPH (benign prostatic hyperplasia) .Chest pain at rest .Unstable angina .Thrombocytopenia .Anemia .CKD (chronic kidney disease), stage III  Principal Problem:  *Unstable angina Active Problems:  Hypertension  Chest pain at rest  CKD (chronic kidney disease), stage III  High cholesterol  GERD (gastroesophageal reflux disease)  Thrombocytopenia  Anemia  BPH (benign prostatic hyperplasia)   Medication List  As of 11/20/2011  8:34 AM   TAKE these medications         ALPRAZolam 0.25 MG tablet   Commonly known as: XANAX   Take 1 tablet (0.25 mg total) by mouth 2 (two) times daily as needed for anxiety.      amLODipine 5 MG tablet   Commonly known as: NORVASC   Take 5 mg by mouth daily.      aspirin EC 81 MG tablet   Take 81 mg by mouth daily.      benazepril 5 MG tablet   Commonly known as: LOTENSIN   Take 5 mg by mouth daily.      esomeprazole 40 MG capsule   Commonly known as: NEXIUM   Take 40 mg by mouth 2 (two) times daily.      finasteride 5 MG tablet   Commonly known as: PROSCAR   Take 5 mg by mouth daily.      fish oil-omega-3 fatty acids 1000 MG capsule   Take 2 g by mouth 2 (two) times daily.      rosuvastatin 5 MG tablet   Commonly known as: CRESTOR   Take 5 mg by mouth daily.            Disposition and Follow-up: With PCP in 4 weeks  Consult: cardiology  Significant Diagnostic Studies:  Dg Chest 2 View  11/18/2011  *RADIOLOGY REPORT*  Clinical Data: Rule out infiltrate  CHEST - 2 VIEW  Comparison: 04/13/2010  Findings: Cardiomediastinal silhouette is stable.  No acute infiltrate or pleural effusion.  No pulmonary edema.  Mild degenerative  changes mid and lower thoracic spine.  IMPRESSION: No active disease.  Mild degenerative changes thoracic spine.  Original Report Authenticated By: Natasha Mead, M.D.    Brief H and P: 78 yo man with hx of HTN, HL presents with left side chest pain 8/10, for a few minutes at rest, worse with deep breathing radiating to the left arm, relieved by nitroglycerin in the ED. This is the second episode today. Last myoview 2007 WNL. No hx of cath , MI, CAD. Currently chest pain free. No recent acute illness   Physical Exam on Discharge:  Filed Vitals:   11/19/11 1039 11/19/11 1400 11/19/11 1447 11/20/11 0622  BP: 112/60 104/69  139/82  Pulse:  54 64 56  Temp:  97.1 F (36.2 C)  97.3 F (36.3 C)  TempSrc:  Oral  Oral  Resp:  20  20  Height:      Weight:      SpO2:  94%  95%     Intake/Output Summary (Last 24 hours) at 11/20/11 0834 Last data filed at 11/20/11 0600  Gross per 24 hour  Intake      3 ml  Output    775 ml  Net   -772 ml    General: Alert, awake,  oriented x3, in no acute distress. HEENT: No bruits, no goiter. Heart: Regular rate and rhythm, without murmurs, rubs, gallops. Lungs: Clear to auscultation bilaterally. Abdomen: Soft, nontender, nondistended, positive bowel sounds. Extremities: No clubbing cyanosis or edema with positive pedal pulses. Neuro: Grossly intact, nonfocal.  CBC:    Component Value Date/Time   WBC 6.2 11/19/2011 0217   HGB 12.8* 11/19/2011 0217   HCT 36.3* 11/19/2011 0217   PLT 129* 11/19/2011 0217   MCV 90.5 11/19/2011 0217   NEUTROABS 3.7 11/18/2011 1555   LYMPHSABS 1.4 11/18/2011 1555   MONOABS 0.5 11/18/2011 1555   EOSABS 0.3 11/18/2011 1555   BASOSABS 0.0 11/18/2011 1555    Basic Metabolic Panel:    Component Value Date/Time   NA 135 11/19/2011 0217   K 3.8 11/19/2011 0217   CL 105 11/19/2011 0217   CO2 23 11/19/2011 0217   BUN 19 11/19/2011 0217   CREATININE 1.58* 11/19/2011 0217   GLUCOSE 106* 11/19/2011 0217   CALCIUM 9.6 11/19/2011 0217     Hospital Course:  Principal Problem:  *Unstable angina  - 3 sets of cardiac enzymes are negative  - lipid panel is within normal limits  - Cath negative - appreciate cardiology involvement in pt's care   Active Problems:  Hypertension  - remains stable over 24 hour period   Chest pain at rest  - ACS ruled out - cath normal  CKD (chronic kidney disease), stage III  - creatinine remained stable and at pt's baseline   High cholesterol  - LDL is at goal  - will need to continue statin   GERD (gastroesophageal reflux disease)  - stable   Thrombocytopenia  - appears to be around pt's baseline  - no active sign of bleeding   Anemia  - Hg/Hct stable   EDUCATION  - test results and diagnostic studies were discussed with patient  - patient verbalized the understanding  - questions were answered at the bedside and contact information was provided for additional questions or concerns  Time spent on Discharge: Over 30 minutes  Signed: Debbora Presto 11/20/2011, 8:34 AM  [phone]

## 2011-11-20 NOTE — Progress Notes (Signed)
I did not see the patient this AM, but the plan was to discharge after IV hydration & lab check, provided there were no post-cath complications.  Thankfully, despite very concerning symptoms for Angina, his cath did not reveal any significant disease & Echo was relatively normal.  Unsure of etiology of chest pain.  We will see him in follow-up for post cath evaluation.  Marykay Lex, M.D., M.S. [facility] [address] [place], [place]  [address]  [phone]  11/20/2011 12:51 PM

## 2011-11-20 NOTE — Progress Notes (Signed)
Subjective: No pain no SIB.  Objective: Vital signs in last 24 hours: Temp:  [97.1 F (36.2 C)-97.3 F (36.3 C)] 97.3 F (36.3 C) (02/12 0622) Pulse Rate:  [54-64] 56  (02/12 0622) Resp:  [20] 20  (02/12 0622) BP: (104-139)/(60-82) 139/82 mmHg (02/12 0622) SpO2:  [94 %-95 %] 95 % (02/12 0622) Weight change:  Last BM Date: 11/19/11 Intake/Output from previous day: -772 02/11 0701 - 02/12 0700 In: 3 [I.V.:3] Out: 775 [Urine:775] Intake/Output this shift:    PE: General:A&O X 3, pleasant affect. No complaints Heart:S1S2, RRR Lungs:clear, without rales, rhonchi or wheezes Abd:+BS, soft non tender Ext:no edema, rt. Radial cath site without hematoma :   Lab Results:  Basename 11/19/11 0217 11/18/11 1555  WBC 6.2 5.8  HGB 12.8* 12.6*  HCT 36.3* 37.1*  PLT 129* 135*   BMET  Basename 11/19/11 0217 11/18/11 1555  NA 135 135  K 3.8 3.9  CL 105 103  CO2 23 23  GLUCOSE 106* 132*  BUN 19 18  CREATININE 1.58* 1.69*  CALCIUM 9.6 9.7    Basename 11/19/11 0900 11/19/11 0213  TROPONINI <0.30 <0.30    Lab Results  Component Value Date   CHOL 157 11/19/2011   HDL 62 11/19/2011   LDLCALC 75 11/19/2011   TRIG 101 11/19/2011   CHOLHDL 2.5 11/19/2011   Lab Results  Component Value Date   HGBA1C 5.8* 11/18/2011     Lab Results  Component Value Date   TSH 2.208 11/18/2011    Hepatic Function Panel  Basename 11/18/11 1555  PROT 7.0  ALBUMIN 3.6  AST 20  ALT 23  ALKPHOS 49  BILITOT 0.4  BILIDIR <0.1  IBILI NOT CALCULATED    Basename 11/19/11 0217  CHOL 157   No results found for this basename: PROTIME in the last 72 hours    EKG: Orders placed in visit on 11/19/11  . EKG 12-LEAD    Studies/Results: Dg Chest 2 View  11/18/2011  *RADIOLOGY REPORT*  Clinical Data: Rule out infiltrate  CHEST - 2 VIEW  Comparison: 04/13/2010  Findings: Cardiomediastinal silhouette is stable.  No acute infiltrate or pleural effusion.  No pulmonary edema.  Mild degenerative  changes mid and lower thoracic spine.  IMPRESSION: No active disease.  Mild degenerative changes thoracic spine.  Original Report Authenticated By: Natasha Mead, M.D.    Medications: I have reviewed the patient's current medications.    Marland Kitchen amLODipine  5 mg Oral Daily  . aspirin      . aspirin  81 mg Oral Daily  . fentaNYL      . finasteride  5 mg Oral Daily  . heparin      . heparin      . lidocaine      . midazolam      . nitroGLYCERIN      . pantoprazole  40 mg Oral BID AC  . rosuvastatin  5 mg Oral Daily  . sodium chloride  3 mL Intravenous Q12H  . verapamil      . DISCONTD: nitroGLYCERIN  0.4 mg Transdermal Daily  . DISCONTD: pantoprazole  40 mg Oral Daily  . DISCONTD: sodium chloride  3 mL Intravenous Q12H   CARDIAC CATH 11/19/11:  NON OBSTRUCTIVE CAD  2D ECHO:Left ventricle: The cavity size was normal. Systolic function was normal. The estimated ejection fraction was in the range of 60% to 65%. Wall motion was normal; there were no regional wall motion abnormalities. Doppler parameters are consistent with abnormal  left ventricular relaxation (grade 1 diastolic dysfunction). - Mitral valve: Mild regurgitation. Impressions:  - Wtih the exeption of mild diastolic dysfunction, this is a relatively normal study. No evidence of valve disease  Assessment/Plan: Patient Active Problem List  Diagnoses  . Hypertension  . High cholesterol  . GERD (gastroesophageal reflux disease)  . BPH (benign prostatic hyperplasia)  . Chest pain at rest  . Unstable angina  . Thrombocytopenia  . Anemia  . CKD (chronic kidney disease), stage III   PLAN:negative MI, non obstructive CAD, normal EF.  Chronic renal insuff, would check cr. Today and recheck in 1 week. Continue to conrtol cholesterol, HTN, and encourage healthy lifestyle.  If pain returns, possible GI source.  Ambulate.    LOS: 2 days   INGOLD,LAURA R 11/20/2011, 8:04 AM

## 2011-11-20 NOTE — Progress Notes (Signed)
1055 - pt d/c home with instructions, r/x, and f/u appointments.  Pt and wife verbalized understanding of instructions, pt home with wife, escorted out by volunteer.  Ninetta Lights RN  11/20/2011

## 2012-09-15 ENCOUNTER — Emergency Department (HOSPITAL_COMMUNITY): Payer: Medicare Other

## 2012-09-15 ENCOUNTER — Observation Stay (HOSPITAL_COMMUNITY): Payer: Medicare Other

## 2012-09-15 ENCOUNTER — Encounter (HOSPITAL_COMMUNITY): Payer: Self-pay | Admitting: Emergency Medicine

## 2012-09-15 ENCOUNTER — Observation Stay (HOSPITAL_COMMUNITY)
Admission: EM | Admit: 2012-09-15 | Discharge: 2012-09-15 | Disposition: A | Discharge status: Home / Self Care | Payer: Medicare Other | Attending: Emergency Medicine | Admitting: Emergency Medicine

## 2012-09-15 DIAGNOSIS — G459 Transient cerebral ischemic attack, unspecified: Secondary | ICD-10-CM

## 2012-09-15 DIAGNOSIS — K219 Gastro-esophageal reflux disease without esophagitis: Secondary | ICD-10-CM | POA: Insufficient documentation

## 2012-09-15 DIAGNOSIS — I69993 Ataxia following unspecified cerebrovascular disease: Principal | ICD-10-CM | POA: Insufficient documentation

## 2012-09-15 DIAGNOSIS — N400 Enlarged prostate without lower urinary tract symptoms: Secondary | ICD-10-CM | POA: Insufficient documentation

## 2012-09-15 DIAGNOSIS — I10 Essential (primary) hypertension: Secondary | ICD-10-CM | POA: Insufficient documentation

## 2012-09-15 DIAGNOSIS — E785 Hyperlipidemia, unspecified: Secondary | ICD-10-CM | POA: Insufficient documentation

## 2012-09-15 DIAGNOSIS — Z85528 Personal history of other malignant neoplasm of kidney: Secondary | ICD-10-CM | POA: Insufficient documentation

## 2012-09-15 DIAGNOSIS — R270 Ataxia, unspecified: Secondary | ICD-10-CM

## 2012-09-15 LAB — CBC
Hemoglobin: 14.3 g/dL (ref 13.0–17.0)
MCHC: 35.2 g/dL (ref 30.0–36.0)
RBC: 4.55 MIL/uL (ref 4.22–5.81)
WBC: 6 10*3/uL (ref 4.0–10.5)

## 2012-09-15 LAB — BASIC METABOLIC PANEL
CO2: 23 mEq/L (ref 19–32)
Chloride: 100 mEq/L (ref 96–112)
GFR calc non Af Amer: 42 mL/min — ABNORMAL LOW (ref >90)
Glucose, Bld: 120 mg/dL — ABNORMAL HIGH (ref 70–99)
Potassium: 3.7 mEq/L (ref 3.5–5.1)
Sodium: 134 mEq/L — ABNORMAL LOW (ref 135–145)

## 2012-09-15 LAB — URINALYSIS, ROUTINE W REFLEX MICROSCOPIC
Glucose, UA: NEGATIVE mg/dL
Leukocytes, UA: NEGATIVE
Protein, ur: NEGATIVE mg/dL

## 2012-09-15 LAB — LIPID PANEL
Cholesterol: 145 mg/dL (ref 0–200)
Triglycerides: 95 mg/dL (ref <150)
VLDL: 19 mg/dL (ref 0–40)

## 2012-09-15 LAB — APTT: aPTT: 33 seconds (ref 24–37)

## 2012-09-15 LAB — PROTIME-INR
INR: 0.96 (ref 0.00–1.49)
Prothrombin Time: 12.7 seconds (ref 11.6–15.2)

## 2012-09-15 MED ORDER — SODIUM CHLORIDE 0.9 % IV SOLN: 20.0000 mL | INTRAVENOUS | Status: DC

## 2012-09-15 MED ORDER — GI COCKTAIL ~~LOC~~
30.0000 mL | Freq: Once | ORAL | Status: AC
  Administered 2012-09-15: 30 mL via ORAL
  Filled 2012-09-15: qty 30

## 2012-09-15 NOTE — ED Notes (Signed)
Walked patient per PA request.  Patient tolerated well.   Gait steady.  Patient claims that the unsteady gait he had this morning was resolved at this time.

## 2012-09-15 NOTE — Progress Notes (Signed)
VASCULAR LAB PRELIMINARY  PRELIMINARY  PRELIMINARY  PRELIMINARY  Carotid Dopplers completed.    Preliminary report:  There is no ICA stenosis.  Vertebral artery flow is antegrade.  Cyann Venti, RVT 09/15/2012, 8:06 AM

## 2012-09-15 NOTE — ED Notes (Signed)
Report given to [facility]. 

## 2012-09-15 NOTE — ED Notes (Signed)
Pt states he woke up and felt woozie  Pt states he took his blood pressure at home and it was elevated so he came in for evaluation

## 2012-09-15 NOTE — Progress Notes (Signed)
  Echocardiogram 2D Echocardiogram has been performed.  Derrick [name] 09/15/2012, 8:16 AM

## 2012-09-15 NOTE — ED Notes (Signed)
Received report from [facility] from [place]

## 2012-09-15 NOTE — ED Notes (Signed)
[facility] called for transport. 

## 2012-09-15 NOTE — ED Provider Notes (Signed)
MRA HEAD Findings: Left vertebral artery is dominant and widely patent. Left PICA is patent. Right vertebral artery is small and ends in pica. Basilar is widely patent. Superior cerebellar and posterior cerebral arteries are patent bilaterally. Internal carotid artery is patent bilaterally. Anterior and middle cerebral arteries are patent bilaterally. Negative for cerebral aneurysm. IMPRESSION: Negative  MRI HEAD Findings: Negative for acute infarct. Atrophy and moderate chronic microvascular ischemic change throughout the cerebral white matter. Chronic infarct left thalamus. Brainstem and cerebellum are intact. Negative for mass or edema. Negative for hemorrhage or fluid collection. Vessels at the base of the brain are patent. IMPRESSION: Atrophy and chronic microvascular TIMI. No acute abnormality.  2D Echo Study Conclusions Left ventricle: The cavity size was normal. Wall thickness was increased in a pattern of mild LVH. There was mild concentric hypertrophy. Systolic function was normal. The estimated ejection fraction was in the range of 55% to 65%. Wall motion was normal; there were no regional wall motion abnormalities. Doppler parameters are consistent with abnormal left ventricular relaxation (grade 1 diastolic dysfunction). No evidence of thrombus  VASCULAR LAB  PRELIMINARY PRELIMINARY PRELIMINARY PRELIMINARY  Carotid Dopplers completed.  Preliminary report: There is no ICA stenosis. Vertebral artery flow is antegrade.  KANADY, CANDACE, RVT  09/15/2012, 8:06 AM  Patient is to be discharged with recommendation to follow up with PCP in regards to today's hospital visit. Pts symptoms unlikely to be of neurologic etiology and there were no focal neurologic findings throughout the patients stay in the ED & CDU. Labs and imaging reviewed again prior to dc. MR and CT with no acute cranial abnormalities, 2D echo & carotid doppler as above. Results discussed w Dr. Wyman Songster who  agrees w dispo plan. Pt ambulated in the ER without difficulty, no sign of current ataxia or dysequilibrium.  Pt has been advised return to the ED if stroke like symptoms present again including slurred speech, severe acute HA, ataxia, weakness, or speech disturbance. Discussed results with patient & answered questions. Pt appears reliable for follow up and is agreeable to discharge.     Jaci Carrel, [place] 09/15/12 1157

## 2012-09-15 NOTE — ED Provider Notes (Addendum)
History     CSN: [id number]  Arrival date & time 09/15/12  0335   First MD Initiated Contact with Patient 09/15/12 [phone]      Chief Complaint  Patient presents with  . Felt woozy     (Consider location/radiation/quality/duration/timing/severity/associated sxs/prior treatment) HPI This is a 78 year old male who got up about 3 this morning and felt "woozy". By this he means he had an odd feeling in his head and felt like he was walking like he was drunk. The symptoms were moderate and have improved somewhat. It was somewhat worse with standing. He took his blood pressure and found it to be 179/98. This concerned him and he came to the ED. He has a 2 year history of nocturnal heartburn. This is been treated with Nexium. For Nexium was initially controlling his symptoms but lately he has been waking up with epigastric discomfort and tenderness along with nausea. He states this is what woke him up this morning. He denies frank chest pain or shortness of breath. He denies focal neurologic deficit. He denies vertigo.  Past Medical History  Diagnosis Date  . Hypertension   . High cholesterol   . GERD (gastroesophageal reflux disease)   . BPH (benign prostatic hyperplasia)   . Renal cell cancer     s/p nephrectomy in 2000    Past Surgical History  Procedure Date  . Nephrectomy 10/1998    Family History  Problem Relation Age of Onset  . Heart attack Father 5  . Kidney failure Sister     Bright's disease     History  Substance Use Topics  . Smoking status: Former Games developer  . Smokeless tobacco: Not on file  . Alcohol Use: No      Review of Systems  All other systems reviewed and are negative.    Allergies  Review of patient's allergies indicates no known allergies.  Home Medications   Current Outpatient Rx  Name  Route  Sig  Dispense  Refill  . AMLODIPINE BESYLATE 5 MG PO TABS   Oral   Take 5 mg by mouth daily.         . ASPIRIN EC 81 MG PO TBEC   Oral   Take 81  mg by mouth daily.         Marland Kitchen BENAZEPRIL HCL 5 MG PO TABS   Oral   Take 5 mg by mouth daily.         Marland Kitchen ESOMEPRAZOLE MAGNESIUM 40 MG PO CPDR   Oral   Take 40 mg by mouth 2 (two) times daily.         Marland Kitchen FINASTERIDE 5 MG PO TABS   Oral   Take 5 mg by mouth daily.         . OMEGA-3 FATTY ACIDS 1000 MG PO CAPS   Oral   Take 2 g by mouth 2 (two) times daily.         Marland Kitchen ROSUVASTATIN CALCIUM 5 MG PO TABS   Oral   Take 5 mg by mouth daily.           BP 120/68  Pulse 65  Temp 98.6 F (37 C) (Oral)  Resp 11  SpO2 95%  Physical Exam General: Well-developed, well-nourished male in no acute distress; appearance consistent with age of record HENT: normocephalic, atraumatic Eyes: pupils equal round and reactive to light; extraocular muscles intact Neck: supple Heart: regular rate and rhythm; frequent PACs Lungs: clear to auscultation bilaterally Abdomen: soft; nondistended;  epigastric tenderness; bowel sounds present Extremities: No deformity; full range of motion; pulses normal Neurologic: Awake, alert and oriented; motor function intact in all extremities and symmetric; no facial droop; normal coordination except for ataxic gait; normal speech; negative Romberg; normal finger to nose Skin: Warm and dry Psychiatric: Normal mood and affect    ED Course  Procedures (including critical care time)     MDM   Nursing notes and vitals signs, including pulse oximetry, reviewed.  Summary of this visit's results, reviewed by myself:  Labs:  Results for orders placed during the hospital encounter of 09/15/12 (from the past 24 hour(s))  CBC     Status: Abnormal   Collection Time   09/15/12  4:06 AM      Component Value Range   WBC 6.0  4.0 - 10.5 K/uL   RBC 4.55  4.22 - 5.81 MIL/uL   Hemoglobin 14.3  13.0 - 17.0 g/dL   HCT 40.9  81.1 - 91.4 %   MCV 89.2  78.0 - 100.0 fL   MCH 31.4  26.0 - 34.0 pg   MCHC 35.2  30.0 - 36.0 g/dL   RDW 78.2  95.6 - 21.3 %   Platelets  148 (*) 150 - 400 K/uL  BASIC METABOLIC PANEL     Status: Abnormal   Collection Time   09/15/12  4:06 AM      Component Value Range   Sodium 134 (*) 135 - 145 mEq/L   Potassium 3.7  3.5 - 5.1 mEq/L   Chloride 100  96 - 112 mEq/L   CO2 23  19 - 32 mEq/L   Glucose, Bld 120 (*) 70 - 99 mg/dL   BUN 17  6 - 23 mg/dL   Creatinine, Ser 0.86 (*) 0.50 - 1.35 mg/dL   Calcium 9.4  8.4 - 57.8 mg/dL   GFR calc non Af Amer 42 (*) >90 mL/min   GFR calc Af Amer 48 (*) >90 mL/min  TROPONIN I     Status: Normal   Collection Time   09/15/12  4:06 AM      Component Value Range   Troponin I <0.30  <0.30 ng/mL  URINALYSIS, ROUTINE W REFLEX MICROSCOPIC     Status: Normal   Collection Time   09/15/12  4:42 AM      Component Value Range   Color, Urine YELLOW  YELLOW   APPearance CLEAR  CLEAR   Specific Gravity, Urine 1.021  1.005 - 1.030   pH 6.0  5.0 - 8.0   Glucose, UA NEGATIVE  NEGATIVE mg/dL   Hgb urine dipstick NEGATIVE  NEGATIVE   Bilirubin Urine NEGATIVE  NEGATIVE   Ketones, ur NEGATIVE  NEGATIVE mg/dL   Protein, ur NEGATIVE  NEGATIVE mg/dL   Urobilinogen, UA 0.2  0.0 - 1.0 mg/dL   Nitrite NEGATIVE  NEGATIVE   Leukocytes, UA NEGATIVE  NEGATIVE    Imaging Studies: Ct Head Wo Contrast  09/15/2012  *RADIOLOGY REPORT*  Clinical Data: Dizziness and hypertension.  CT HEAD WITHOUT CONTRAST  Technique:  Contiguous axial images were obtained from the base of the skull through the vertex without contrast.  Comparison: None.  Findings: Diffuse cerebral atrophy.  Ventricular dilatation consistent with central atrophy.  Patchy low attenuation changes in the deep white matter consistent with small vessel ischemia and old lacunar infarcts.  No mass effect or midline shift.  No abnormal extra-axial fluid collections.  Gray-white matter junctions are distinct.  No effacement of the basal cisterns.  No evidence of acute intracranial hemorrhage.  Tortuous and ectatic basilar artery.  Vascular calcifications.   Visualized mastoid air cells are not opacified.  Opacification of some of the ethmoid air cells bilaterally.  IMPRESSION: No acute intracranial abnormalities.  Chronic atrophy and small vessel ischemic changes.   Original Report Authenticated By: Burman Nieves, M.D.       EKG Interpretation:  Date & Time: 09/15/2012 3:54 AM  Rate: 67  Rhythm: Supraventricular bigeminy  QRS Axis: normal  Intervals: normal  ST/T Wave abnormalities: nonspecific T wave changes  Conduction Disutrbances:nonspecific intraventricular conduction delay  Narrative Interpretation:   Old EKG Reviewed: No significant changes  5:40 AM Gait ataxia persists. We'll transfer patient to [facility] CDU on TIA protocol.        Hanley Seamen, MD 09/15/12 0540  Hanley Seamen, MD 09/15/12 [phone]

## 2012-09-15 NOTE — ED Notes (Addendum)
Received call from Dr. Read Drivers requesting to have pt transferred to [facility] CDU on TIA protocol/ Explained protocol advised the NIHSS and Stroke Swallow per protocol must be completed before transported to CDU/ Informed Dr. Read Drivers that a hold is placed for CDU 8

## 2012-09-15 NOTE — ED Notes (Signed)
Report called to Dee, RN

## 2012-09-16 NOTE — ED Provider Notes (Signed)
Pt of Dr Read Drivers.   Suzi Roots, MD 09/16/12 [phone]

## 2012-10-02 ENCOUNTER — Emergency Department (HOSPITAL_COMMUNITY): Payer: Medicare Other

## 2012-10-02 ENCOUNTER — Emergency Department (HOSPITAL_COMMUNITY)
Admission: EM | Admit: 2012-10-02 | Discharge: 2012-10-02 | Disposition: A | Discharge status: Home / Self Care | Payer: Medicare Other | Attending: Emergency Medicine | Admitting: Emergency Medicine

## 2012-10-02 ENCOUNTER — Encounter (HOSPITAL_COMMUNITY): Payer: Self-pay | Admitting: Emergency Medicine

## 2012-10-02 DIAGNOSIS — Z85528 Personal history of other malignant neoplasm of kidney: Secondary | ICD-10-CM | POA: Insufficient documentation

## 2012-10-02 DIAGNOSIS — Z7982 Long term (current) use of aspirin: Secondary | ICD-10-CM | POA: Insufficient documentation

## 2012-10-02 DIAGNOSIS — R4182 Altered mental status, unspecified: Secondary | ICD-10-CM

## 2012-10-02 DIAGNOSIS — I10 Essential (primary) hypertension: Secondary | ICD-10-CM | POA: Insufficient documentation

## 2012-10-02 DIAGNOSIS — Z87891 Personal history of nicotine dependence: Secondary | ICD-10-CM | POA: Insufficient documentation

## 2012-10-02 DIAGNOSIS — R059 Cough, unspecified: Secondary | ICD-10-CM | POA: Insufficient documentation

## 2012-10-02 DIAGNOSIS — R05 Cough: Secondary | ICD-10-CM | POA: Insufficient documentation

## 2012-10-02 DIAGNOSIS — Z86718 Personal history of other venous thrombosis and embolism: Secondary | ICD-10-CM | POA: Insufficient documentation

## 2012-10-02 DIAGNOSIS — N400 Enlarged prostate without lower urinary tract symptoms: Secondary | ICD-10-CM | POA: Insufficient documentation

## 2012-10-02 DIAGNOSIS — E7800 Pure hypercholesterolemia, unspecified: Secondary | ICD-10-CM | POA: Insufficient documentation

## 2012-10-02 DIAGNOSIS — Z79899 Other long term (current) drug therapy: Secondary | ICD-10-CM | POA: Insufficient documentation

## 2012-10-02 DIAGNOSIS — K219 Gastro-esophageal reflux disease without esophagitis: Secondary | ICD-10-CM | POA: Insufficient documentation

## 2012-10-02 LAB — BASIC METABOLIC PANEL
Calcium: 9.3 mg/dL (ref 8.4–10.5)
GFR calc Af Amer: 48 mL/min — ABNORMAL LOW (ref >90)
GFR calc non Af Amer: 41 mL/min — ABNORMAL LOW (ref >90)
Glucose, Bld: 84 mg/dL (ref 70–99)
Sodium: 135 mEq/L (ref 135–145)

## 2012-10-02 LAB — URINALYSIS, ROUTINE W REFLEX MICROSCOPIC
Glucose, UA: NEGATIVE mg/dL
Leukocytes, UA: NEGATIVE
Nitrite: NEGATIVE
Specific Gravity, Urine: 1.017 (ref 1.005–1.030)
pH: 5.5 (ref 5.0–8.0)

## 2012-10-02 LAB — CBC
MCH: 31.6 pg (ref 26.0–34.0)
MCHC: 35.2 g/dL (ref 30.0–36.0)
Platelets: 144 10*3/uL — ABNORMAL LOW (ref 150–400)
RDW: 13 % (ref 11.5–15.5)

## 2012-10-02 NOTE — ED Notes (Signed)
Patient reports that slept for about 30 minutes.

## 2012-10-02 NOTE — ED Notes (Signed)
Patient states that when he woke up his blood pressure was high and he was staggering. He took mucicnex this am at 0600

## 2012-10-02 NOTE — ED Provider Notes (Addendum)
History     CSN: [id number]  Arrival date & time 10/02/12  1126   First MD Initiated Contact with Patient 10/02/12 1205      Chief Complaint  Patient presents with  . Stroke Symptoms  . Dizziness     The history is provided by the patient and the spouse.   the patient's had new cough and nasal congestion over the past 24 hours.  He tried a dose of Mucinex last night which did not help his symptoms but made it more difficult for him to sleep.  He tried another dose  of Mucinex this morning at approximately 7 AM.  He went and had a normal breakfast and then took a nap around 10:30 when he awoke he was acutely confused.  He stated he "felt like a zombie and felt in another world".  He now reports that he feels normal.  He denies unilateral leg or arm weakness.  No numbness or tingling.  No facial asymmetry.  No difficulty with speech.  He was recently placed in the clinical decision unit at [facility] and worked up for TIA several weeks ago which showed normal carotid arteries normal echocardiogram and a normal MRI.  The patient is on a 1 mg aspirin.  At this time he has no complaints except for mild right-sided headache.  His wife reports his most as is returned to baseline.  No recent fevers or chills no nausea or vomiting.  No chest pain or neck pain.  No other complaints.  Past Medical History  Diagnosis Date  . Hypertension   . High cholesterol   . GERD (gastroesophageal reflux disease)   . BPH (benign prostatic hyperplasia)   . Renal cell cancer     s/p nephrectomy in 2000    Past Surgical History  Procedure Date  . Nephrectomy 10/1998    Family History  Problem Relation Age of Onset  . Heart attack Father 5  . Kidney failure Sister     Bright's disease     History  Substance Use Topics  . Smoking status: Former Games developer  . Smokeless tobacco: Not on file  . Alcohol Use: No      Review of Systems  All other systems reviewed and are  negative.    Allergies  Review of patient's allergies indicates no known allergies.  Home Medications   Current Outpatient Rx  Name  Route  Sig  Dispense  Refill  . AMLODIPINE BESYLATE 5 MG PO TABS   Oral   Take 5 mg by mouth daily.         . ASPIRIN EC 81 MG PO TBEC   Oral   Take 81 mg by mouth daily.         Marland Kitchen BENAZEPRIL HCL 5 MG PO TABS   Oral   Take 5 mg by mouth daily.         Marland Kitchen ESOMEPRAZOLE MAGNESIUM 40 MG PO CPDR   Oral   Take 40 mg by mouth 2 (two) times daily.         Marland Kitchen FINASTERIDE 5 MG PO TABS   Oral   Take 5 mg by mouth daily.         . OMEGA-3 FATTY ACIDS 1000 MG PO CAPS   Oral   Take 2 g by mouth 2 (two) times daily.         Marland Kitchen ROSUVASTATIN CALCIUM 5 MG PO TABS   Oral   Take 5 mg by  mouth daily.           BP 164/77  Pulse 57  Temp 98.6 F (37 C)  Resp 16  SpO2 97%  Physical Exam  Nursing note and vitals reviewed. Constitutional: He is oriented to person, place, and time. He appears well-developed and well-nourished.  HENT:  Head: Normocephalic and atraumatic.  Eyes: EOM are normal. Pupils are equal, round, and reactive to light.  Neck: Normal range of motion.  Cardiovascular: Normal rate, regular rhythm, normal heart sounds and intact distal pulses.   Pulmonary/Chest: Effort normal and breath sounds normal. No respiratory distress.  Abdominal: Soft. He exhibits no distension. There is no tenderness.  Musculoskeletal: Normal range of motion.  Neurological: He is alert and oriented to person, place, and time.       5/5 strength in major muscle groups of  bilateral upper and lower extremities. Speech normal. No facial asymetry.   Skin: Skin is warm and dry.  Psychiatric: He has a normal mood and affect. Judgment normal.    ED Course  Procedures (including critical care time)  Labs Reviewed  CBC - Abnormal; Notable for the following:    Platelets 144 (*)     All other components within normal limits  BASIC METABOLIC PANEL -  Abnormal; Notable for the following:    Creatinine, Ser 1.54 (*)     GFR calc non Af Amer 41 (*)     GFR calc Af Amer 48 (*)     All other components within normal limits  URINALYSIS, ROUTINE W REFLEX MICROSCOPIC   Dg Chest 2 View  10/02/2012  *RADIOLOGY REPORT*  Clinical Data: Dizziness, confusion  CHEST - 2 VIEW  Comparison: 11/18/2011  Findings: Cardiomediastinal silhouette is stable.  No acute infiltrate or pleural effusion.  No pulmonary edema.  Mild atherosclerotic calcifications thoracic aorta.  Mild degenerative changes thoracic spine.  IMPRESSION: No active disease.  No significant change.   Original Report Authenticated By: Natasha Mead, M.D.    Ct Head Wo Contrast  10/02/2012  *RADIOLOGY REPORT*  Clinical Data: Altered mental status, dizziness, headaches  CT HEAD WITHOUT CONTRAST  Technique:  Contiguous axial images were obtained from the base of the skull through the vertex without contrast.  Comparison: 09/15/2012  Findings: Generalized atrophy. Normal ventricular morphology. No midline shift or mass effect. Small vessel chronic ischemic changes of deep cerebral white matter. No intracranial hemorrhage, mass lesion or evidence of acute infarction. Small old infarct at left thalamus unchanged. No extra-axial fluid collections. Atherosclerotic calcifications at skull base. Osseous demineralization. Mucosal thickening ethmoid air cells and sphenoid sinus.  IMPRESSION: Atrophy with small vessel chronic ischemic changes of deep cerebral white matter. Small old lacunar infarct at left thalamus. No acute intracranial abnormalities.   Original Report Authenticated By: Ulyses Southward, M.D.    I personally reviewed the imaging tests through PACS system I reviewed available ER/hospitalization records through the EMR   1. Altered mental status       MDM  2:41 PM Patient has no complaints at this time.  Return to normal mental status.  I suspect this is more related to his Mucinex use which began  last night as this can cause some confusion and insomnia.  His TIA workup several weeks ago was without abnormality.  He's on an 81 mg aspirin daily.  Recommend they followup with his primary care physician as well as followup with the neurologist.  Patient and wife understand to return to the emergency department for new or  worsening symptoms.  Normal neurologic exam emergency department.        Lyanne Co, MD 10/02/12 1657   Date: 10/02/2012  Rate: 58  Rhythm: normal sinus rhythm  QRS Axis: normal  Intervals: normal  ST/T Wave abnormalities: lateral T wave inversions  Conduction Disutrbances: none  Narrative Interpretation:   Old EKG Reviewed: No significant changes noted     Lyanne Co, MD 10/02/12 1709

## 2012-10-02 NOTE — ED Notes (Signed)
Pt escorted to discharge window. Pt verbalized understanding discharge instructions. In no acute distress.  

## 2012-10-02 NOTE — ED Notes (Signed)
Patient reports that he has had a cold x 1 week. The patient fell asleep and he woke up - patient woke up after feeling groggy and light headed. The patient seemed confused and unable to recognize his wife. Denies any pain at the time. The patient reports he "felt like a zombie and in another world" The patient reports that he feels almost back to normal. The patient reports he feel asleep at 1030

## 2012-10-02 NOTE — ED Notes (Signed)
Pt back from CT

## 2013-10-22 ENCOUNTER — Encounter: Payer: Self-pay | Admitting: Cardiovascular Disease

## 2013-10-22 ENCOUNTER — Ambulatory Visit (INDEPENDENT_AMBULATORY_CARE_PROVIDER_SITE_OTHER): Payer: Medicare Other | Admitting: Cardiovascular Disease

## 2013-10-22 ENCOUNTER — Encounter (HOSPITAL_COMMUNITY): Payer: Self-pay | Admitting: *Deleted

## 2013-10-22 VITALS — BP 132/80 | HR 92 | Resp 16 | Ht 72.0 in | Wt 205.0 lb

## 2013-10-22 DIAGNOSIS — E7800 Pure hypercholesterolemia, unspecified: Secondary | ICD-10-CM

## 2013-10-22 DIAGNOSIS — R0602 Shortness of breath: Secondary | ICD-10-CM

## 2013-10-22 DIAGNOSIS — I10 Essential (primary) hypertension: Secondary | ICD-10-CM

## 2013-10-22 DIAGNOSIS — R0600 Dyspnea, unspecified: Secondary | ICD-10-CM | POA: Insufficient documentation

## 2013-10-22 DIAGNOSIS — R9431 Abnormal electrocardiogram [ECG] [EKG]: Secondary | ICD-10-CM

## 2013-10-22 NOTE — Progress Notes (Signed)
Patient ID: Derrick [name], male   DOB: [birth year], 80 y.o.   MRN: [id number]      Reason for office visit Abnormal ECG  Dr. Shelia Media referred Derrick [name] back for evaluation since his electrocardiogram shows worsening changes. He has profound repolarization abnormalities across the entire anterior precordium. There is as much as 2 mm downsloping ST segment depression and T-wave inversion in these leads. The changes are similar in pattern to an electrocardiogram performed last year but are much more dramatic in amplitude. He also continues to have frequent PACs are minimally symptomatic.  He has not had any chest discomfort either at rest or with activity. On the other hand he has noticed that he becomes more short of breath compared to a year ago. He has been blaming this on being "out of shape" but he is actually substantially more active than the average 80 year old. He denies dizziness, syncope, intermittent claudication, edema and is only aware of palpitations sometimes when he lies in bed at night.  He has stage III chronic kidney disease and is status post unilateral nephrectomy for renal cell carcinoma. He has a history of marked bradycardia with beta blockers. He has never undergone coronary angiography.   No Known Allergies  Current Outpatient Prescriptions  Medication Sig Dispense Refill  . amLODipine (NORVASC) 5 MG tablet Take 5 mg by mouth daily.      Marland Kitchen aspirin EC 81 MG tablet Take 81 mg by mouth daily.      . benazepril (LOTENSIN) 5 MG tablet Take 5 mg by mouth daily.      . finasteride (PROSCAR) 5 MG tablet Take 5 mg by mouth daily.      . fish oil-omega-3 fatty acids 1000 MG capsule Take 2 g by mouth daily.       Marland Kitchen leflunomide (ARAVA) 10 MG tablet Take 1 tablet by mouth daily.      Marland Kitchen losartan (COZAAR) 50 MG tablet Take 1 tablet by mouth daily.      . predniSONE (DELTASONE) 5 MG tablet Take 5 mg by mouth daily with breakfast. Take 1 & 1/2 tablet daily.      . ranitidine (ZANTAC) 150  MG tablet Take 1 tablet by mouth 2 (two) times daily.      . rosuvastatin (CRESTOR) 5 MG tablet Take 5 mg by mouth daily.       No current facility-administered medications for this visit.    Past Medical History  Diagnosis Date  . Hypertension   . High cholesterol   . GERD (gastroesophageal reflux disease)   . BPH (benign prostatic hyperplasia)   . Renal cell cancer     s/p nephrectomy in 2000    Past Surgical History  Procedure Laterality Date  . Nephrectomy  10/1998    Family History  Problem Relation Age of Onset  . Heart attack Father 50  . Kidney failure Sister     Bright's disease     History   Social History  . Marital Status: Married    Spouse Name: N/A    Number of Children: N/A  . Years of Education: N/A   Occupational History  . Not on file.   Social History Main Topics  . Smoking status: Former Research scientist (life sciences)  . Smokeless tobacco: Not on file  . Alcohol Use: No  . Drug Use: No  . Sexual Activity: Not on file   Other Topics Concern  . Not on file   Social History Narrative   Retired  Married   4 children          Review of systems: The patient specifically denies any chest pain at rest or with exertion, dyspnea at rest or with light exertion, orthopnea, paroxysmal nocturnal dyspnea, syncope, palpitations, focal neurological deficits, intermittent claudication, lower extremity edema, unexplained weight gain, cough, hemoptysis or wheezing.  The patient also denies abdominal pain, nausea, vomiting, dysphagia, diarrhea, constipation, polyuria, polydipsia, dysuria, hematuria, frequency, urgency, abnormal bleeding or bruising, fever, chills, unexpected weight changes, mood swings, change in skin or hair texture, change in voice quality, auditory or visual problems, allergic reactions or rashes, new musculoskeletal complaints other than usual "aches and pains".   PHYSICAL EXAM BP 132/80  Pulse 92  Resp 16  Ht 6' (1.829 m)  Wt 92.987 kg (205 lb)  BMI  27.80 kg/m2  General: Alert, oriented x3, no distress Head: no evidence of trauma, PERRL, EOMI, no exophtalmos or lid lag, no myxedema, no xanthelasma; normal ears, nose and oropharynx Neck: normal jugular venous pulsations and no hepatojugular reflux; brisk carotid pulses without delay and no carotid bruits Chest: clear to auscultation, no signs of consolidation by percussion or palpation, normal fremitus, symmetrical and full respiratory excursions Cardiovascular: normal position and quality of the apical impulse, regular rhythm, normal first and second heart sounds, no murmurs, rubs or gallops Abdomen: no tenderness or distention, no masses by palpation, no abnormal pulsatility or arterial bruits, normal bowel sounds, no hepatosplenomegaly Extremities: no clubbing, cyanosis or edema; 2+ radial, ulnar and brachial pulses bilaterally; 2+ right femoral, posterior tibial and dorsalis pedis pulses; 2+ left femoral, posterior tibial and dorsalis pedis pulses; no subclavian or femoral bruits Neurological: grossly nonfocal   EKG: Sinus rhythm with frequent PACs, left ventricular hypertrophy, very prominent and her lateral repolarization abnormalities  Lipid Panel     Component Value Date/Time   CHOL 145 09/15/2012 0406   TRIG 95 09/15/2012 0406   HDL 62 09/15/2012 0406   CHOLHDL 2.3 09/15/2012 0406   VLDL 19 09/15/2012 0406   LDLCALC 64 09/15/2012 0406    BMET    Component Value Date/Time   NA 135 10/02/2012 1241   K 4.3 10/02/2012 1241   CL 102 10/02/2012 1241   CO2 23 10/02/2012 1241   GLUCOSE 84 10/02/2012 1241   BUN 18 10/02/2012 1241   CREATININE 1.54* 10/02/2012 1241   CALCIUM 9.3 10/02/2012 1241   GFRNONAA 41* 10/02/2012 1241   GFRAA 48* 10/02/2012 1241     ASSESSMENT AND PLAN Abnormal ECG His electrocardiogram has shown waxing and waning degrees of lateral ST segment depression and T-wave inversion over the years, but I agree that the current ECG changes are quite dramatic.  While they may be explained by LVH, the dynamic variation in repolarization pattern is worrisome for ischemia. I have recommended that he undergo a functional study. Since his baseline ECG has profound ST depression he should undergo a treadmill nuclear scintigraphy study. If this is normal and similar to the results from 2011, I think we can ascribe his EKG changes with LVH, although it is surprising that his LVH would have progressed in the setting of good blood pressure control.  Shortness of breath His performance on the treadmill stress test can be compared to the similar protocol that he underwent in 2011 to see if there is indeed a decrease in his functional capacity. If this is objectively confirmed that we should look at his left ventricular systolic function again by echocardiography. If however he is actually  able to perform a similar level of physical exertion, repeat evaluation would not appear necessary.  Hypertension Good control. Target blood pressure 130/80 since he has grade 3 chronic kidney disease.  High cholesterol Excellent recent lipid profile  Orders Placed This Encounter  Procedures  . Myocardial Perfusion Imaging  . EKG 12-Lead   Meds ordered this encounter  Medications  . losartan (COZAAR) 50 MG tablet    Sig: Take 1 tablet by mouth daily.  . ranitidine (ZANTAC) 150 MG tablet    Sig: Take 1 tablet by mouth 2 (two) times daily.  Marland Kitchen leflunomide (ARAVA) 10 MG tablet    Sig: Take 1 tablet by mouth daily.  . predniSONE (DELTASONE) 5 MG tablet    Sig: Take 5 mg by mouth daily with breakfast. Take 1 & 1/2 tablet daily.    Holli Humbles, MD, [place] [phone] office [phone] pager

## 2013-10-22 NOTE — Assessment & Plan Note (Signed)
Excellent recent lipid profile 

## 2013-10-22 NOTE — Patient Instructions (Signed)
Your physician recommends that you schedule a follow-up appointment in: 1 year with Dr Timmie Foerster  Your physician has requested that you have en exercise stress myoview. For further information please visit [email or web address]. Please follow instruction sheet, as given.

## 2013-10-22 NOTE — Assessment & Plan Note (Signed)
Good control. Target blood pressure 130/80 since he has grade 3 chronic kidney disease.

## 2013-10-22 NOTE — Assessment & Plan Note (Signed)
His electrocardiogram has shown waxing and waning degrees of lateral ST segment depression and T-wave inversion over the years, but I agree that the current ECG changes are quite dramatic. While they may be explained by LVH, the dynamic variation in repolarization pattern is worrisome for ischemia. I have recommended that he undergo a functional study. Since his baseline ECG has profound ST depression he should undergo a treadmill nuclear scintigraphy study. If this is normal and similar to the results from 2011, I think we can ascribe his EKG changes with LVH, although it is surprising that his LVH would have progressed in the setting of good blood pressure control.

## 2013-10-22 NOTE — Assessment & Plan Note (Signed)
His performance on the treadmill stress test can be compared to the similar protocol that he underwent in 2011 to see if there is indeed a decrease in his functional capacity. If this is objectively confirmed that we should look at his left ventricular systolic function again by echocardiography. If however he is actually able to perform a similar level of physical exertion, repeat evaluation would not appear necessary.

## 2013-10-28 ENCOUNTER — Ambulatory Visit (HOSPITAL_COMMUNITY)
Admission: RE | Admit: 2013-10-28 | Discharge: 2013-10-28 | Disposition: A | Discharge status: Home / Self Care | Payer: Medicare Other | Source: Ambulatory Visit | Attending: Cardiovascular Disease | Admitting: Cardiovascular Disease

## 2013-10-28 DIAGNOSIS — R0602 Shortness of breath: Secondary | ICD-10-CM | POA: Insufficient documentation

## 2013-10-28 MED ORDER — TECHNETIUM TC 99M SESTAMIBI GENERIC - CARDIOLITE
30.0000 | Freq: Once | INTRAVENOUS | Status: AC | PRN
  Administered 2013-10-28: 30 via INTRAVENOUS

## 2013-10-28 MED ORDER — TECHNETIUM TC 99M SESTAMIBI GENERIC - CARDIOLITE
10.0000 | Freq: Once | INTRAVENOUS | Status: AC | PRN
  Administered 2013-10-28: 10 via INTRAVENOUS

## 2013-10-28 NOTE — Procedures (Addendum)
[facility] [address] [place] [address] [phone]  Cardiology Nuclear Med Study  Derrick [name] is a 80 y.o. male     MRN : [id number]     DOB: [birth year]  Procedure Date: 10/28/2013  Nuclear Med Background Indication for Stress Test:  Evaluation for Ischemia and Abnormal EKG History:  BPH;Thrombocytopenia;CKD;SOB s/p Nephrectomy Cardiac Risk Factors: Family History - CAD, History of Smoking, Hypertension, Lipids and Overweight  Symptoms:  Chest Pain, Dizziness, DOE, Light-Headedness, Palpitations and SOB   Nuclear Pre-Procedure Caffeine/Decaff Intake:  7:00pm NPO After: 5:00am   IV Site: R Antecubital  IV 0.9% NS with Angio Cath:  22g  Chest Size (in):  44"  IV Started by: Azucena Cecil, RN  Height: 6' (1.829 m)  Cup Size: n/a  BMI:  Body mass index is 27.8 kg/(m^2). Weight:  205 lb (92.987 kg)   Tech Comments:  n/a    Nuclear Med Study 1 or 2 day study: 1 day  Stress Test Type:  Stress  Order Authorizing Provider:  Sanda Klein, MD   Resting Radionuclide: Technetium 59m Sestamibi  Resting Radionuclide Dose: 10.4 mCi   Stress Radionuclide:  Technetium 87m Sestamibi  Stress Radionuclide Dose: 30.9 mCi           Stress Protocol Rest HR: 71 Stress HR: 136  Rest BP:148/93 Stress BP: 195/90  Exercise Time (min): 6:00 METS: 7.00   Predicted Max HR: 141 bpm % Max HR: 96.45 bpm Rate Pressure Product: 26520  Dose of Adenosine (mg):  n/a Dose of Lexiscan: n/a mg  Dose of Atropine (mg): n/a Dose of Dobutamine: n/a mcg/kg/min (at max HR)  Stress Test Technologist: Mellody Memos, CCT Nuclear Technologist: Otho Perl, CNMT   Rest Procedure:  Myocardial perfusion imaging was performed at rest 45 minutes following the intravenous administration of Technetium 21m Sestamibi. Stress Procedure:  The patient performed treadmill exercise using a Bruce  Protocol for 6 minutes. The patient stopped due to shortness of  breath and fatigue. Patient denied any chest pain.  There were no significant ST-T wave changes.  Technetium 94m Sestamibi was injected at peak exercise and myocardial perfusion imaging was performed after a brief delay.  Transient Ischemic Dilatation (Normal <1.22):  1.05 Lung/Heart Ratio (Normal <0.45):  0.27 QGS EDV:  n/a ml QGS ESV:  n/a ml LV Ejection Fraction: Study not gated.      Rest ECG: NSR-LVH with prominent secondary repolarization abnormalities  Stress ECG: There are scattered PVCs., There are scattered PACs. and 2-3 mm additional downsloping ST depression  QPS Raw Data Images:  Normal; no motion artifact; normal heart/lung ratio. Stress Images:  Normal homogeneous uptake in all areas of the myocardium. Rest Images:  Normal homogeneous uptake in all areas of the myocardium. Subtraction (SDS):  No evidence of ischemia.  Impression Exercise Capacity:  Fair exercise capacity. BP Response:  Normal blood pressure response. Clinical Symptoms:  No significant symptoms noted. ECG Impression:  Non diagnostic stress ECG due to baseline ST segment abnormalities Comparison with Prior Nuclear Study: No images to compare  Overall Impression:  Normal stress nuclear study.  LV Wall Motion:  Non gated (ectopy)   Derrick Broyles, MD  10/28/2013 12:26 PM

## 2013-11-27 ENCOUNTER — Emergency Department (HOSPITAL_COMMUNITY): Payer: Medicare Other

## 2013-11-27 ENCOUNTER — Emergency Department (HOSPITAL_COMMUNITY)
Admission: EM | Admit: 2013-11-27 | Discharge: 2013-11-27 | Disposition: A | Discharge status: Home / Self Care | Payer: Medicare Other | Attending: Emergency Medicine | Admitting: Emergency Medicine

## 2013-11-27 ENCOUNTER — Encounter (HOSPITAL_COMMUNITY): Payer: Self-pay | Admitting: Emergency Medicine

## 2013-11-27 DIAGNOSIS — IMO0002 Reserved for concepts with insufficient information to code with codable children: Secondary | ICD-10-CM | POA: Insufficient documentation

## 2013-11-27 DIAGNOSIS — Z85528 Personal history of other malignant neoplasm of kidney: Secondary | ICD-10-CM | POA: Insufficient documentation

## 2013-11-27 DIAGNOSIS — Z87448 Personal history of other diseases of urinary system: Secondary | ICD-10-CM | POA: Insufficient documentation

## 2013-11-27 DIAGNOSIS — R1110 Vomiting, unspecified: Secondary | ICD-10-CM

## 2013-11-27 DIAGNOSIS — B9789 Other viral agents as the cause of diseases classified elsewhere: Secondary | ICD-10-CM | POA: Insufficient documentation

## 2013-11-27 DIAGNOSIS — B349 Viral infection, unspecified: Secondary | ICD-10-CM

## 2013-11-27 DIAGNOSIS — I10 Essential (primary) hypertension: Secondary | ICD-10-CM | POA: Insufficient documentation

## 2013-11-27 DIAGNOSIS — Z7982 Long term (current) use of aspirin: Secondary | ICD-10-CM | POA: Insufficient documentation

## 2013-11-27 DIAGNOSIS — Z79899 Other long term (current) drug therapy: Secondary | ICD-10-CM | POA: Insufficient documentation

## 2013-11-27 DIAGNOSIS — Z87891 Personal history of nicotine dependence: Secondary | ICD-10-CM | POA: Insufficient documentation

## 2013-11-27 DIAGNOSIS — K219 Gastro-esophageal reflux disease without esophagitis: Secondary | ICD-10-CM | POA: Insufficient documentation

## 2013-11-27 DIAGNOSIS — E7800 Pure hypercholesterolemia, unspecified: Secondary | ICD-10-CM | POA: Insufficient documentation

## 2013-11-27 LAB — COMPREHENSIVE METABOLIC PANEL
ALT: 35 U/L (ref 0–53)
AST: 23 U/L (ref 0–37)
Albumin: 3.7 g/dL (ref 3.5–5.2)
Alkaline Phosphatase: 49 U/L (ref 39–117)
BILIRUBIN TOTAL: 0.5 mg/dL (ref 0.3–1.2)
BUN: 18 mg/dL (ref 6–23)
CALCIUM: 9.7 mg/dL (ref 8.4–10.5)
CHLORIDE: 98 meq/L (ref 96–112)
CO2: 23 meq/L (ref 19–32)
CREATININE: 1.39 mg/dL — AB (ref 0.50–1.35)
GFR calc Af Amer: 54 mL/min — ABNORMAL LOW (ref >90)
GFR, EST NON AFRICAN AMERICAN: 47 mL/min — AB (ref >90)
Glucose, Bld: 144 mg/dL — ABNORMAL HIGH (ref 70–99)
Potassium: 4 mEq/L (ref 3.7–5.3)
Sodium: 135 mEq/L — ABNORMAL LOW (ref 137–147)
Total Protein: 7 g/dL (ref 6.0–8.3)

## 2013-11-27 LAB — URINALYSIS, ROUTINE W REFLEX MICROSCOPIC
Bilirubin Urine: NEGATIVE
Glucose, UA: NEGATIVE mg/dL
Hgb urine dipstick: NEGATIVE
KETONES UR: NEGATIVE mg/dL
LEUKOCYTES UA: NEGATIVE
NITRITE: NEGATIVE
PROTEIN: 30 mg/dL — AB
Specific Gravity, Urine: 1.019 (ref 1.005–1.030)
UROBILINOGEN UA: 1 mg/dL (ref 0.0–1.0)
pH: 7.5 (ref 5.0–8.0)

## 2013-11-27 LAB — CBC WITH DIFFERENTIAL/PLATELET
BASOS ABS: 0 10*3/uL (ref 0.0–0.1)
BASOS PCT: 0 % (ref 0–1)
EOS PCT: 1 % (ref 0–5)
Eosinophils Absolute: 0 10*3/uL (ref 0.0–0.7)
HEMATOCRIT: 43.1 % (ref 39.0–52.0)
HEMOGLOBIN: 14.9 g/dL (ref 13.0–17.0)
LYMPHS PCT: 6 % — AB (ref 12–46)
Lymphs Abs: 0.4 10*3/uL — ABNORMAL LOW (ref 0.7–4.0)
MCH: 31.9 pg (ref 26.0–34.0)
MCHC: 34.6 g/dL (ref 30.0–36.0)
MCV: 92.3 fL (ref 78.0–100.0)
MONO ABS: 0.6 10*3/uL (ref 0.1–1.0)
MONOS PCT: 10 % (ref 3–12)
NEUTROS ABS: 5.2 10*3/uL (ref 1.7–7.7)
Neutrophils Relative %: 83 % — ABNORMAL HIGH (ref 43–77)
Platelets: 115 10*3/uL — ABNORMAL LOW (ref 150–400)
RBC: 4.67 MIL/uL (ref 4.22–5.81)
RDW: 13.4 % (ref 11.5–15.5)
WBC: 6.3 10*3/uL (ref 4.0–10.5)

## 2013-11-27 LAB — URINE MICROSCOPIC-ADD ON: URINE-OTHER: NONE SEEN

## 2013-11-27 LAB — LIPASE, BLOOD: LIPASE: 35 U/L (ref 11–59)

## 2013-11-27 MED ORDER — ONDANSETRON 4 MG PO TBDP: 4.0000 mg | ORAL_TABLET | Freq: Three times a day (TID) | ORAL | Status: DC | PRN

## 2013-11-27 MED ORDER — PANTOPRAZOLE SODIUM 40 MG IV SOLR
40.0000 mg | Freq: Once | INTRAVENOUS | Status: AC
  Administered 2013-11-27: 40 mg via INTRAVENOUS
  Filled 2013-11-27: qty 40

## 2013-11-27 MED ORDER — SODIUM CHLORIDE 0.9 % IV BOLUS (SEPSIS)
1000.0000 mL | Freq: Once | INTRAVENOUS | Status: AC
  Administered 2013-11-27: 1000 mL via INTRAVENOUS

## 2013-11-27 MED ORDER — ONDANSETRON HCL 4 MG/2ML IJ SOLN
4.0000 mg | Freq: Once | INTRAMUSCULAR | Status: AC
  Administered 2013-11-27: 4 mg via INTRAVENOUS
  Filled 2013-11-27: qty 2

## 2013-11-27 NOTE — ED Provider Notes (Signed)
CSN: [id number]     Arrival date & time 11/27/13  6237 History   First MD Initiated Contact with Patient 11/27/13 [phone]     Chief Complaint  Patient presents with  . Nausea  . Chills      HPI  Presents here with acute illness. 2 days ago had some shakes and chills and mild nausea. Mild nausea beginning last night. Vomiting this morning. Several episodes of diarrhea yesterday but didn't abate as well. Heme-negative nonbilious emesis. No frank rigors and no elevated temperature at home. No blood pus or mucus in the stools. No dysuria frequency or hesitancy. No back pain no flank pain. No shortness of breath. He feels some discomfort in his upper abdomen that is relieved with emesis. No lower abdominal pain.  Past Medical History  Diagnosis Date  . Hypertension   . High cholesterol   . GERD (gastroesophageal reflux disease)   . BPH (benign prostatic hyperplasia)   . Renal cell cancer     s/p nephrectomy in 2000   Past Surgical History  Procedure Laterality Date  . Nephrectomy  10/1998   Family History  Problem Relation Age of Onset  . Heart attack Father 68  . Kidney failure Sister     Bright's disease    History  Substance Use Topics  . Smoking status: Former Research scientist (life sciences)  . Smokeless tobacco: Not on file  . Alcohol Use: No    Review of Systems  Constitutional: Positive for chills. Negative for fever, diaphoresis, appetite change and fatigue.  HENT: Negative for mouth sores, sore throat and trouble swallowing.   Eyes: Negative for visual disturbance.  Respiratory: Negative for cough, chest tightness, shortness of breath and wheezing.   Cardiovascular: Negative for chest pain.  Gastrointestinal: Positive for nausea, vomiting, abdominal pain and diarrhea. Negative for abdominal distention.  Endocrine: Negative for polydipsia, polyphagia and polyuria.  Genitourinary: Negative for dysuria, frequency and hematuria.  Musculoskeletal: Negative for gait problem.  Skin: Negative for  color change, pallor and rash.  Neurological: Negative for dizziness, syncope, light-headedness and headaches.  Hematological: Does not bruise/bleed easily.  Psychiatric/Behavioral: Negative for behavioral problems and confusion.      Allergies  Review of patient's allergies indicates no known allergies.  Home Medications   Current Outpatient Rx  Name  Route  Sig  Dispense  Refill  . amLODipine (NORVASC) 5 MG tablet   Oral   Take 5 mg by mouth daily.         Marland Kitchen aspirin EC 81 MG tablet   Oral   Take 81 mg by mouth daily.         . benazepril (LOTENSIN) 5 MG tablet   Oral   Take 5 mg by mouth daily.         . finasteride (PROSCAR) 5 MG tablet   Oral   Take 5 mg by mouth daily.         . fish oil-omega-3 fatty acids 1000 MG capsule   Oral   Take 2 g by mouth daily.          . flunisolide (NASALIDE) 25 MCG/ACT (0.025%) SOLN   Each Nare   Place 2 sprays into both nostrils at bedtime.         Marland Kitchen leflunomide (ARAVA) 10 MG tablet   Oral   Take 1 tablet by mouth daily.         Marland Kitchen losartan (COZAAR) 50 MG tablet   Oral   Take 50 mg by mouth  daily.          . Multiple Vitamins-Minerals (MULTIVITAMIN WITH MINERALS) tablet   Oral   Take 1 tablet by mouth daily.         . predniSONE (DELTASONE) 5 MG tablet   Oral   Take 5 mg by mouth daily with breakfast. Take 1 & 1/2 tablet daily.         . ranitidine (ZANTAC) 150 MG tablet   Oral   Take 1 tablet by mouth 2 (two) times daily.         . rosuvastatin (CRESTOR) 5 MG tablet   Oral   Take 5 mg by mouth daily.         . ondansetron (ZOFRAN ODT) 4 MG disintegrating tablet   Oral   Take 1 tablet (4 mg total) by mouth every 8 (eight) hours as needed for nausea.   20 tablet   0    BP 99/65  Pulse 115  Temp(Src) 99.4 F (37.4 C) (Oral)  Resp 16  SpO2 94% Physical Exam  Constitutional: He is oriented to person, place, and time. He appears well-developed and well-nourished. No distress.  Awake  and alert. Hepatofugal flow. He does not appear frankly toxic  HENT:  Head: Normocephalic.  Eyes: Conjunctivae are normal. Pupils are equal, round, and reactive to light. No scleral icterus.  Neck: Normal range of motion. Neck supple. No thyromegaly present.  Cardiovascular: Normal rate and regular rhythm.  Exam reveals no gallop and no friction rub.   No murmur heard. At the bedside his heart rate is 85. Sinus rhythm on the monitor.  Pulmonary/Chest: Effort normal and breath sounds normal. No respiratory distress. He has no wheezes. He has no rales.  Clear breath sounds. Not tachypneic. No focal diminished breath sounds.  Abdominal: Soft. Bowel sounds are normal. He exhibits no distension. There is no tenderness. There is no rebound.  Abdomen soft. No appreciable tenderness in epigastrium. Benign abdomen otherwise without focal tenderness. Definitely  no peritoneal irritation  Musculoskeletal: Normal range of motion.  Neurological: He is alert and oriented to person, place, and time.  Skin: Skin is warm and dry. No rash noted.  Psychiatric: He has a normal mood and affect. His behavior is normal.    ED Course  Procedures (including critical care time) Labs Review Labs Reviewed  CBC WITH DIFFERENTIAL - Abnormal; Notable for the following:    Platelets 115 (*)    Neutrophils Relative % 83 (*)    Lymphocytes Relative 6 (*)    Lymphs Abs 0.4 (*)    All other components within normal limits  COMPREHENSIVE METABOLIC PANEL - Abnormal; Notable for the following:    Sodium 135 (*)    Glucose, Bld 144 (*)    Creatinine, Ser 1.39 (*)    GFR calc non Af Amer 47 (*)    GFR calc Af Amer 54 (*)    All other components within normal limits  URINALYSIS, ROUTINE W REFLEX MICROSCOPIC - Abnormal; Notable for the following:    Protein, ur 30 (*)    All other components within normal limits  LIPASE, BLOOD  URINE MICROSCOPIC-ADD ON   Imaging Review Dg Chest 2 View  11/27/2013   CLINICAL DATA:   Nausea, chills  EXAM: CHEST  2 VIEW  COMPARISON:  Chest x-ray of 10/02/2012  FINDINGS: No pneumonia is seen. Mild peribronchial thickening is noted which may indicate bronchitis. Mediastinal contours are stable and heart size is stable. No bony abnormality is  seen.  IMPRESSION: No active lung disease.  Mild peribronchial thickening.   Electronically Signed   By: Ivar Drape M.D.   On: 11/27/2013 09:42    EKG Interpretation   None       MDM   Final diagnoses:  Vomiting  Viral syndrome    His labs and studies are reassuring. No symptoms or signs suggestive of suppurative infection or sepsis. He is taking by mouth. Plan will be home. Expectant management. Clear liquids. Zofran as needed. Recheck here if any worsening of symptoms.    Tanna Furry, MD 11/27/13 [phone]

## 2013-11-27 NOTE — ED Notes (Signed)
Pt c/o nausea and chills x 2 days, had vomit episode this morning. Pt also c/o abd pain.

## 2013-11-27 NOTE — Discharge Instructions (Signed)
Nausea and Vomiting Nausea is a sick feeling that often comes before throwing up (vomiting). Vomiting is a reflex where stomach contents come out of your mouth. Vomiting can cause severe loss of body fluids (dehydration). Children and elderly adults can become dehydrated quickly, especially if they also have diarrhea. Nausea and vomiting are symptoms of a condition or disease. It is important to find the cause of your symptoms. CAUSES   Direct irritation of the stomach lining. This irritation can result from increased acid production (gastroesophageal reflux disease), infection, food poisoning, taking certain medicines (such as nonsteroidal anti-inflammatory drugs), alcohol use, or tobacco use.  Signals from the brain.These signals could be caused by a headache, heat exposure, an inner ear disturbance, increased pressure in the brain from injury, infection, a tumor, or a concussion, pain, emotional stimulus, or metabolic problems.  An obstruction in the gastrointestinal tract (bowel obstruction).  Illnesses such as diabetes, hepatitis, gallbladder problems, appendicitis, kidney problems, cancer, sepsis, atypical symptoms of a heart attack, or eating disorders.  Medical treatments such as chemotherapy and radiation.  Receiving medicine that makes you sleep (general anesthetic) during surgery. DIAGNOSIS Your caregiver may ask for tests to be done if the problems do not improve after a few days. Tests may also be done if symptoms are severe or if the reason for the nausea and vomiting is not clear. Tests may include:  Urine tests.  Blood tests.  Stool tests.  Cultures (to look for evidence of infection).  X-rays or other imaging studies. Test results can help your caregiver make decisions about treatment or the need for additional tests. TREATMENT You need to stay well hydrated. Drink frequently but in small amounts.You may wish to drink water, sports drinks, clear broth, or eat frozen  ice pops or gelatin dessert to help stay hydrated.When you eat, eating slowly may help prevent nausea.There are also some antinausea medicines that may help prevent nausea. HOME CARE INSTRUCTIONS   Take all medicine as directed by your caregiver.  If you do not have an appetite, do not force yourself to eat. However, you must continue to drink fluids.  If you have an appetite, eat a normal diet unless your caregiver tells you differently.  Eat a variety of complex carbohydrates (rice, wheat, potatoes, bread), lean meats, yogurt, fruits, and vegetables.  Avoid high-fat foods because they are more difficult to digest.  Drink enough water and fluids to keep your urine clear or pale yellow.  If you are dehydrated, ask your caregiver for specific rehydration instructions. Signs of dehydration may include:  Severe thirst.  Dry lips and mouth.  Dizziness.  Dark urine.  Decreasing urine frequency and amount.  Confusion.  Rapid breathing or pulse. SEEK IMMEDIATE MEDICAL CARE IF:   You have blood or brown flecks (like coffee grounds) in your vomit.  You have black or bloody stools.  You have a severe headache or stiff neck.  You are confused.  You have severe abdominal pain.  You have chest pain or trouble breathing.  You do not urinate at least once every 8 hours.  You develop cold or clammy skin.  You continue to vomit for longer than 24 to 48 hours.  You have a fever. MAKE SURE YOU:   Understand these instructions.  Will watch your condition.  Will get help right away if you are not doing well or get worse. Document Released: 09/24/2005 Document Revised: 12/17/2011 Document Reviewed: 02/21/2011 [facility] Patient Information 2014 [place], [place].  Viral Infections A virus  is a type of germ. Viruses can cause:  Minor sore throats.  Aches and pains.  Headaches.  Runny nose.  Rashes.  Watery eyes.  Tiredness.  Coughs.  Loss of appetite.  Feeling  sick to your stomach (nausea).  Throwing up (vomiting).  Watery poop (diarrhea). HOME CARE   Only take medicines as told by your doctor.  Drink enough water and fluids to keep your pee (urine) clear or pale yellow. Sports drinks are a good choice.  Get plenty of rest and eat healthy. Soups and broths with crackers or rice are fine. GET HELP RIGHT AWAY IF:   You have a very bad headache.  You have shortness of breath.  You have chest pain or neck pain.  You have an unusual rash.  You cannot stop throwing up.  You have watery poop that does not stop.  You cannot keep fluids down.  You or your child has a temperature by mouth above 102 F (38.9 C), not controlled by medicine.  Your baby is older than 3 months with a rectal temperature of 102 F (38.9 C) or higher.  Your baby is 37 months old or younger with a rectal temperature of 100.4 F (38 C) or higher. MAKE SURE YOU:   Understand these instructions.  Will watch this condition.  Will get help right away if you are not doing well or get worse. Document Released: 09/06/2008 Document Revised: 12/17/2011 Document Reviewed: 01/30/2011 [facility] Patient Information 2014 [place], [place].

## 2014-09-16 ENCOUNTER — Encounter (HOSPITAL_COMMUNITY): Payer: Self-pay | Admitting: Cardiology

## 2014-10-13 DIAGNOSIS — E780 Pure hypercholesterolemia: Secondary | ICD-10-CM | POA: Diagnosis not present

## 2014-10-13 DIAGNOSIS — Z125 Encounter for screening for malignant neoplasm of prostate: Secondary | ICD-10-CM | POA: Diagnosis not present

## 2014-10-13 DIAGNOSIS — I10 Essential (primary) hypertension: Secondary | ICD-10-CM | POA: Diagnosis not present

## 2014-10-13 DIAGNOSIS — Z0000 Encounter for general adult medical examination without abnormal findings: Secondary | ICD-10-CM | POA: Diagnosis not present

## 2014-10-28 DIAGNOSIS — C61 Malignant neoplasm of prostate: Secondary | ICD-10-CM | POA: Diagnosis not present

## 2014-10-28 DIAGNOSIS — L570 Actinic keratosis: Secondary | ICD-10-CM | POA: Diagnosis not present

## 2014-10-28 DIAGNOSIS — M0579 Rheumatoid arthritis with rheumatoid factor of multiple sites without organ or systems involvement: Secondary | ICD-10-CM | POA: Diagnosis not present

## 2014-10-28 DIAGNOSIS — Z0000 Encounter for general adult medical examination without abnormal findings: Secondary | ICD-10-CM | POA: Diagnosis not present

## 2014-10-28 DIAGNOSIS — E780 Pure hypercholesterolemia: Secondary | ICD-10-CM | POA: Diagnosis not present

## 2014-11-01 DIAGNOSIS — M4186 Other forms of scoliosis, lumbar region: Secondary | ICD-10-CM | POA: Diagnosis not present

## 2014-11-01 DIAGNOSIS — M1612 Unilateral primary osteoarthritis, left hip: Secondary | ICD-10-CM | POA: Diagnosis not present

## 2014-11-01 DIAGNOSIS — M5136 Other intervertebral disc degeneration, lumbar region: Secondary | ICD-10-CM | POA: Diagnosis not present

## 2014-11-01 DIAGNOSIS — M79605 Pain in left leg: Secondary | ICD-10-CM | POA: Diagnosis not present

## 2014-11-01 DIAGNOSIS — M0579 Rheumatoid arthritis with rheumatoid factor of multiple sites without organ or systems involvement: Secondary | ICD-10-CM | POA: Diagnosis not present

## 2014-11-08 ENCOUNTER — Other Ambulatory Visit: Payer: Self-pay | Admitting: Rheumatology

## 2014-11-08 DIAGNOSIS — M79605 Pain in left leg: Secondary | ICD-10-CM

## 2014-11-11 ENCOUNTER — Ambulatory Visit
Admission: RE | Admit: 2014-11-11 | Discharge: 2014-11-11 | Disposition: A | Discharge status: Home / Self Care | Payer: Medicare Other | Source: Ambulatory Visit | Attending: Rheumatology | Admitting: Rheumatology

## 2014-11-11 DIAGNOSIS — M4806 Spinal stenosis, lumbar region: Secondary | ICD-10-CM | POA: Diagnosis not present

## 2014-11-11 DIAGNOSIS — M5137 Other intervertebral disc degeneration, lumbosacral region: Secondary | ICD-10-CM | POA: Diagnosis not present

## 2014-11-11 DIAGNOSIS — M47817 Spondylosis without myelopathy or radiculopathy, lumbosacral region: Secondary | ICD-10-CM | POA: Diagnosis not present

## 2014-11-11 DIAGNOSIS — M79605 Pain in left leg: Secondary | ICD-10-CM

## 2014-11-11 DIAGNOSIS — M4317 Spondylolisthesis, lumbosacral region: Secondary | ICD-10-CM | POA: Diagnosis not present

## 2014-11-12 ENCOUNTER — Ambulatory Visit
Admission: RE | Admit: 2014-11-12 | Discharge: 2014-11-12 | Disposition: A | Discharge status: Home / Self Care | Payer: Medicare Other | Source: Ambulatory Visit | Attending: Rheumatology | Admitting: Rheumatology

## 2014-11-12 DIAGNOSIS — M79605 Pain in left leg: Secondary | ICD-10-CM

## 2014-11-12 DIAGNOSIS — I739 Peripheral vascular disease, unspecified: Secondary | ICD-10-CM | POA: Diagnosis not present

## 2014-11-25 DIAGNOSIS — I10 Essential (primary) hypertension: Secondary | ICD-10-CM | POA: Diagnosis not present

## 2014-12-01 DIAGNOSIS — I10 Essential (primary) hypertension: Secondary | ICD-10-CM | POA: Diagnosis not present

## 2014-12-01 DIAGNOSIS — N401 Enlarged prostate with lower urinary tract symptoms: Secondary | ICD-10-CM | POA: Diagnosis not present

## 2014-12-08 DIAGNOSIS — M5416 Radiculopathy, lumbar region: Secondary | ICD-10-CM | POA: Diagnosis not present

## 2014-12-16 DIAGNOSIS — C61 Malignant neoplasm of prostate: Secondary | ICD-10-CM | POA: Diagnosis not present

## 2014-12-22 DIAGNOSIS — C61 Malignant neoplasm of prostate: Secondary | ICD-10-CM | POA: Diagnosis not present

## 2014-12-22 DIAGNOSIS — R972 Elevated prostate specific antigen [PSA]: Secondary | ICD-10-CM | POA: Diagnosis not present

## 2014-12-22 DIAGNOSIS — R3915 Urgency of urination: Secondary | ICD-10-CM | POA: Diagnosis not present

## 2014-12-22 DIAGNOSIS — N401 Enlarged prostate with lower urinary tract symptoms: Secondary | ICD-10-CM | POA: Diagnosis not present

## 2014-12-27 DIAGNOSIS — M5416 Radiculopathy, lumbar region: Secondary | ICD-10-CM | POA: Diagnosis not present

## 2014-12-27 DIAGNOSIS — M5126 Other intervertebral disc displacement, lumbar region: Secondary | ICD-10-CM | POA: Diagnosis not present

## 2014-12-30 DIAGNOSIS — N401 Enlarged prostate with lower urinary tract symptoms: Secondary | ICD-10-CM | POA: Diagnosis not present

## 2014-12-30 DIAGNOSIS — M79605 Pain in left leg: Secondary | ICD-10-CM | POA: Diagnosis not present

## 2014-12-30 DIAGNOSIS — I10 Essential (primary) hypertension: Secondary | ICD-10-CM | POA: Diagnosis not present

## 2015-01-12 DIAGNOSIS — R413 Other amnesia: Secondary | ICD-10-CM | POA: Diagnosis not present

## 2015-01-12 DIAGNOSIS — F419 Anxiety disorder, unspecified: Secondary | ICD-10-CM | POA: Diagnosis not present

## 2015-02-07 ENCOUNTER — Ambulatory Visit: Payer: Medicare Other | Admitting: Neurology

## 2015-03-17 DIAGNOSIS — R109 Unspecified abdominal pain: Secondary | ICD-10-CM | POA: Diagnosis not present

## 2015-04-06 DIAGNOSIS — R350 Frequency of micturition: Secondary | ICD-10-CM | POA: Diagnosis not present

## 2015-04-13 DIAGNOSIS — N138 Other obstructive and reflux uropathy: Secondary | ICD-10-CM | POA: Diagnosis not present

## 2015-04-13 DIAGNOSIS — C61 Malignant neoplasm of prostate: Secondary | ICD-10-CM | POA: Diagnosis not present

## 2015-04-13 DIAGNOSIS — N401 Enlarged prostate with lower urinary tract symptoms: Secondary | ICD-10-CM | POA: Diagnosis not present

## 2015-04-13 DIAGNOSIS — R3915 Urgency of urination: Secondary | ICD-10-CM | POA: Diagnosis not present

## 2015-04-19 DIAGNOSIS — M0579 Rheumatoid arthritis with rheumatoid factor of multiple sites without organ or systems involvement: Secondary | ICD-10-CM | POA: Diagnosis not present

## 2015-04-19 DIAGNOSIS — R109 Unspecified abdominal pain: Secondary | ICD-10-CM | POA: Diagnosis not present

## 2015-04-19 DIAGNOSIS — M79605 Pain in left leg: Secondary | ICD-10-CM | POA: Diagnosis not present

## 2015-04-28 DIAGNOSIS — R110 Nausea: Secondary | ICD-10-CM | POA: Diagnosis not present

## 2015-04-28 DIAGNOSIS — R0602 Shortness of breath: Secondary | ICD-10-CM | POA: Diagnosis not present

## 2015-04-28 DIAGNOSIS — J449 Chronic obstructive pulmonary disease, unspecified: Secondary | ICD-10-CM | POA: Diagnosis not present

## 2015-04-28 DIAGNOSIS — R079 Chest pain, unspecified: Secondary | ICD-10-CM | POA: Diagnosis not present

## 2015-05-27 ENCOUNTER — Other Ambulatory Visit (HOSPITAL_COMMUNITY): Payer: Self-pay | Admitting: Respiratory Therapy

## 2015-05-27 DIAGNOSIS — Z23 Encounter for immunization: Secondary | ICD-10-CM | POA: Diagnosis not present

## 2015-05-27 DIAGNOSIS — R0609 Other forms of dyspnea: Secondary | ICD-10-CM | POA: Diagnosis not present

## 2015-05-27 DIAGNOSIS — R1084 Generalized abdominal pain: Secondary | ICD-10-CM | POA: Diagnosis not present

## 2015-05-27 DIAGNOSIS — R0600 Dyspnea, unspecified: Secondary | ICD-10-CM

## 2015-05-30 ENCOUNTER — Telehealth: Payer: Self-pay | Admitting: Cardiology

## 2015-05-30 NOTE — Telephone Encounter (Signed)
Received records from [facility] for appointment on 06/06/15 with Ellen Henri, [place].  Records given to [employer] (medical records) for Brittany's schedule on 06/06/15.

## 2015-06-01 ENCOUNTER — Ambulatory Visit (HOSPITAL_COMMUNITY)
Admission: RE | Admit: 2015-06-01 | Discharge: 2015-06-01 | Disposition: A | Discharge status: Home / Self Care | Payer: Medicare Other | Source: Ambulatory Visit | Attending: Internal Medicine | Admitting: Internal Medicine

## 2015-06-01 DIAGNOSIS — R0600 Dyspnea, unspecified: Secondary | ICD-10-CM

## 2015-06-01 LAB — SPIROMETRY WITH GRAPH
FEF 25-75 PRE: 2.23 L/s
FEF2575-%PRED-PRE: 126 %
FEV1-%PRED-PRE: 112 %
FEV1-PRE: 2.91 L
FEV1FVC-%PRED-PRE: 105 %
FEV6-%Pred-Pre: 112 %
FEV6-PRE: 3.83 L
FEV6FVC-%Pred-Pre: 105 %
FVC-%PRED-PRE: 106 %
FVC-Pre: 3.89 L
Pre FEV1/FVC ratio: 75 %
Pre FEV6/FVC Ratio: 98 %

## 2015-06-06 ENCOUNTER — Encounter: Payer: Self-pay | Admitting: Cardiology

## 2015-06-06 ENCOUNTER — Ambulatory Visit (INDEPENDENT_AMBULATORY_CARE_PROVIDER_SITE_OTHER): Payer: Medicare Other | Admitting: Cardiology

## 2015-06-06 ENCOUNTER — Ambulatory Visit (INDEPENDENT_AMBULATORY_CARE_PROVIDER_SITE_OTHER): Payer: Medicare Other

## 2015-06-06 VITALS — BP 162/84 | Ht 72.0 in | Wt 212.0 lb

## 2015-06-06 DIAGNOSIS — I10 Essential (primary) hypertension: Secondary | ICD-10-CM

## 2015-06-06 DIAGNOSIS — R0600 Dyspnea, unspecified: Secondary | ICD-10-CM | POA: Diagnosis not present

## 2015-06-06 DIAGNOSIS — R002 Palpitations: Secondary | ICD-10-CM

## 2015-06-06 NOTE — Progress Notes (Signed)
06/06/2015 Derrick [name]   09-Dec-1931  [id number]  Primary Physician [name], MD Primary Cardiologist: Dr. [name]  Reason for Visit/CC: Dyspnea on exertion  HPI:  The patient is a 81 year old male with a history of stage III chronic kidney disease, status post unilateral nephrectomy for renal cell carcinoma. He also has a history of marked bradycardia on beta blockers. In January 2015, he was referred to Dr. [name] for evaluation for an abnormal EKG and dyspnea on exertion. He denied chest pain at that time. EKG demonstrated profound repolarization abnormalities across the entire anterior precordium with as much as 2 mm downsloping ST segment depressions and T-wave inversions. Dr. [name] ordered him to undergo a nuclear stress test to evaluate for ischemia. The test was a negative study. His last 2-D echocardiogram was in December 2013 which revealed normal left ventricular systolic function with an ejection fraction of 55-60% along with grade 1 diastolic dysfunction.  The patient presents back to clinic today with complaints of continued dyspnea on exertion + occasional resting dyspnea. He continues to deny chest pain. He also notes recent orthopnea. He increased to 2 pillows at bedtime, which has improved his breathing at night. He denies LEE. He also notes recent palpitations, but denies syncope/ near syncope. He notes mild fatigue, but denies melena. 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 He denies any exposures to chemical irrantans. No known exposures to asbestos. He was recently seen by his PCP who ordered pulmonary function tests.  His EKG today demonstrates SR with PVCs.     Current Outpatient Prescriptions  Medication Sig Dispense Refill  . amLODipine (NORVASC) 5 MG tablet Take 5 mg by mouth daily.    Marland Kitchen aspirin EC 81 MG tablet Take 81 mg by mouth daily.    Marland Kitchen atorvastatin (LIPITOR) 80 MG tablet Take 80 mg by mouth  daily.    . finasteride (PROSCAR) 5 MG tablet Take 5 mg by mouth daily.    . fish oil-omega-3 fatty acids 1000 MG capsule Take 4 g by mouth daily.     . flunisolide (NASALIDE) 25 MCG/ACT (0.025%) SOLN Place 2 sprays into both nostrils as needed.     Marland Kitchen losartan-hydrochlorothiazide (HYZAAR) 100-12.5 MG per tablet Take 0.5 tablets by mouth daily.    . Multiple Vitamins-Minerals (MULTIVITAMIN WITH MINERALS) tablet Take 1 tablet by mouth daily.    . pantoprazole (PROTONIX) 40 MG tablet Take 40 mg by mouth at bedtime.    . predniSONE (DELTASONE) 5 MG tablet Take 5 mg by mouth daily with breakfast.     . tamsulosin (FLOMAX) 0.4 MG CAPS capsule Take 0.4 mg by mouth daily.    . traMADol (ULTRAM) 50 MG tablet Take 50 mg by mouth at bedtime as needed.     No current facility-administered medications for this visit.    No Known Allergies  Social History   Social History  . Marital Status: Married    Spouse Name: N/A  . Number of Children: N/A  . Years of Education: N/A   Occupational History  . Not on file.   Social History Main Topics  . Smoking status: Former Research scientist (life sciences)  . Smokeless tobacco: Not on file  . Alcohol Use: No  . Drug Use: No  . Sexual Activity: Not on file   Other Topics Concern  . Not on file   Social History Narrative   Retired   Married   4 children  Review of Systems: General: negative for chills, fever, night sweats or weight changes.  Cardiovascular: negative for chest pain, dyspnea on exertion, edema, orthopnea, palpitations, paroxysmal nocturnal dyspnea or shortness of breath Dermatological: negative for rash Respiratory: negative for cough or wheezing Urologic: negative for hematuria Abdominal: negative for nausea, vomiting, diarrhea, bright red blood per rectum, melena, or hematemesis Neurologic: negative for visual changes, syncope, or dizziness All other systems reviewed and are otherwise negative except as noted above.    Blood pressure  162/84, height 6' (1.829 m), weight 212 lb (96.163 kg).  General appearance: alert, cooperative and no distress Neck: no carotid bruit and no JVD Lungs: clear to auscultation bilaterally Heart: regular rate and rhythm and occasional PVCs Extremities: no LEE Pulses: 2+ and symmetric Skin: warm and dry Neurologic: Grossly normal  EKG SR with anterolateral ST and T wave abnormalities, similar to prior EKG.   ASSESSMENT AND PLAN:   1. Dyspnea and Palpitations: PVCs noted on EKG and audible on exam. Will order a heart monitor to assess PVC burden and assess for other potential cardiac arrhthymias including afib/flutter. Patient also noted to have a soft systolic murmur at apex. Will order a 2D echo to assess LVF and assess valve anatomy. PFTs ordered. PCP will follow.    PLAN  1 week heart monitor to assess for arrhthymias and PVC burden. Also 2D echo to assess LV systolic function and valvular function.   SIMMONS, BRITTAINY PA-C 06/06/2015 2:10 PM

## 2015-06-06 NOTE — Patient Instructions (Signed)
Your physician has requested that you have an echocardiogram. Echocardiography is a painless test that uses sound waves to create images of your heart. It provides your doctor with information about the size and shape of your heart and how well your heart's chambers and valves are working. This procedure takes approximately one hour. There are no restrictions for this procedure.   Your physician has recommended that you wear an event monitor. Event monitors are medical devices that record the heart's electrical activity. Doctors most often [place] these monitors to diagnose arrhythmias. Arrhythmias are problems with the speed or rhythm of the heartbeat. The monitor is a small, portable device. You can wear one while you do your normal daily activities. This is usually used to diagnose what is causing palpitations/syncope (passing out).  This will be worn for 7 days.   Your physician recommends that you schedule a follow-up appointment in: 3 weeks with an extender.

## 2015-06-16 ENCOUNTER — Ambulatory Visit (HOSPITAL_COMMUNITY): Payer: Medicare Other | Attending: Cardiovascular Disease

## 2015-06-16 ENCOUNTER — Other Ambulatory Visit: Payer: Self-pay

## 2015-06-16 DIAGNOSIS — I10 Essential (primary) hypertension: Secondary | ICD-10-CM | POA: Diagnosis not present

## 2015-06-16 DIAGNOSIS — R002 Palpitations: Secondary | ICD-10-CM

## 2015-06-16 DIAGNOSIS — R0600 Dyspnea, unspecified: Secondary | ICD-10-CM | POA: Diagnosis not present

## 2015-06-16 DIAGNOSIS — I517 Cardiomegaly: Secondary | ICD-10-CM | POA: Insufficient documentation

## 2015-06-16 DIAGNOSIS — Z87891 Personal history of nicotine dependence: Secondary | ICD-10-CM | POA: Insufficient documentation

## 2015-06-16 DIAGNOSIS — Z8249 Family history of ischemic heart disease and other diseases of the circulatory system: Secondary | ICD-10-CM | POA: Insufficient documentation

## 2015-06-16 DIAGNOSIS — E785 Hyperlipidemia, unspecified: Secondary | ICD-10-CM | POA: Diagnosis not present

## 2015-06-27 ENCOUNTER — Ambulatory Visit (INDEPENDENT_AMBULATORY_CARE_PROVIDER_SITE_OTHER): Payer: Medicare Other | Admitting: Cardiology

## 2015-06-27 ENCOUNTER — Encounter: Payer: Self-pay | Admitting: Cardiology

## 2015-06-27 VITALS — BP 170/86 | HR 79 | Ht 72.0 in | Wt 214.0 lb

## 2015-06-27 DIAGNOSIS — I10 Essential (primary) hypertension: Secondary | ICD-10-CM | POA: Diagnosis not present

## 2015-06-27 MED ORDER — AMLODIPINE BESYLATE 10 MG PO TABS: 10.0000 mg | ORAL_TABLET | Freq: Every day | ORAL | Status: DC

## 2015-06-27 NOTE — Progress Notes (Signed)
06/27/2015 Derrick [name]   [birth year]  [id number]  Primary Physician [name], MD Primary Cardiologist: Dr. [name]   Reason for Visit/CC:  Follow-up for dyspnea  HPI:  The patient is a 81 year old male with a history of stage III chronic kidney disease, status post unilateral nephrectomy for renal cell carcinoma. He also has a history of marked bradycardia on beta blockers. In January 2015, he was referred to Dr. [name] for evaluation for an abnormal EKG and dyspnea on exertion. He denied chest pain at that time. EKG demonstrated profound repolarization abnormalities across the entire anterior precordium with as much as 2 mm downsloping ST segment depressions and T-wave inversions. Dr. [name] ordered him to undergo a nuclear stress test to evaluate for ischemia. The test was a negative study.  Additional medical problems include hypertension and hyperlipidemia.   I recently evaluated him in clinic on 06/06/2015 when he presented with complaints of dyspnea on exertion. He denied any chest discomfort at that time. He also denied orthopnea or PND. He did however note palpitations but denied syncope/near syncope. At the time of his appointment, his EKG revealed sinus rhythm with  PVCs but no ischemia. Subsequently, I ordered for him to be evaluated with a two-week heart monitor to assess PVC burden and to rule out arrhythmias.  This showed occasional PACs and PVCs but no A. Fib/flutter or other worrisome arrhythmias. I also ordered for him to be evaluated with a 2-D echocardiogram. This revealed normal left ventricular systolic function with an ejection fraction of 50-55%. Left ventricular hypertrophy with grade 1 diastolic dysfunction was noted. No significant valvular abnormalities were noted.  He also underwent pulmonary function tests which were normal.   He present back to clinic today for follow-up. He notes that he continues to have dyspnea on exertion but notes that symptoms  only occur with moderate prolonged activity. He typically develops symptoms after 10 minutes of exertion. He denies any symptoms with light activity. He has no difficulties/ limitations with day-to-day activities such as household chores or while walking up a flight of stairs.He continues not to deny any chest discomfort.  His only issue today his hypertension. Systolic blood pressure is 170. Looking back at prior office visit, his systolic blood pressure was 162. He reports full medication compliance.   PFTs were normal.   Current Outpatient Prescriptions  Medication Sig Dispense Refill  . amLODipine (NORVASC) 5 MG tablet Take 5 mg by mouth daily.    Marland Kitchen aspirin EC 81 MG tablet Take 81 mg by mouth daily.    Marland Kitchen atorvastatin (LIPITOR) 80 MG tablet Take 80 mg by mouth daily.    . finasteride (PROSCAR) 5 MG tablet Take 5 mg by mouth daily.    . fish oil-omega-3 fatty acids 1000 MG capsule Take 4 g by mouth daily.     Marland Kitchen losartan-hydrochlorothiazide (HYZAAR) 100-12.5 MG per tablet Take 0.5 tablets by mouth daily.    . Multiple Vitamins-Minerals (MULTIVITAMIN WITH MINERALS) tablet Take 1 tablet by mouth daily.    . predniSONE (DELTASONE) 5 MG tablet Take 5 mg by mouth daily with breakfast.      No current facility-administered medications for this visit.    No Known Allergies  Social History   Social History  . Marital Status: Married    Spouse Name: N/A  . Number of Children: N/A  . Years of Education: N/A   Occupational History  . Not on file.   Social History Main Topics  . Smoking status:  Former Smoker  . Smokeless tobacco: Not on file  . Alcohol Use: No  . Drug Use: No  . Sexual Activity: Not on file   Other Topics Concern  . Not on file   Social History Narrative   Retired   Married   4 children           Review of Systems: General: negative for chills, fever, night sweats or weight changes.  Cardiovascular: negative for chest pain, dyspnea on exertion, edema,  orthopnea, palpitations, paroxysmal nocturnal dyspnea or shortness of breath Dermatological: negative for rash Respiratory: negative for cough or wheezing Urologic: negative for hematuria Abdominal: negative for nausea, vomiting, diarrhea, bright red blood per rectum, melena, or hematemesis Neurologic: negative for visual changes, syncope, or dizziness All other systems reviewed and are otherwise negative except as noted above.    Blood pressure 170/86, pulse 79, height 6' (1.829 m), weight 214 lb (97.07 kg).  General appearance: alert, cooperative and no distress Neck: no carotid bruit and no JVD Lungs: clear to auscultation bilaterally Heart: regular rate and rhythm, S1, S2 normal, no murmur, click, rub or gallop Extremities: no LEE Pulses: 2+ and symmetric Skin: warm and dry Neurologic: Grossly normal  EKG not performed  ASSESSMENT AND PLAN:   1. Dyspnea:  There is low suspicion for cardiac etiologies. His 2-D echocardiogram revealed normal left ventricular systolic function and wall motion. There are no significant valvular abnormalities. He also had a nuclear stress test a year ago that was negative for ischemia. His dyspnea also only occurs after prolonged, moderate physical activity. He denies any exertional dyspnea with usual day-to-day activities/mild physical activity.  His dyspnea with prolonged moderate activity could be  age-related. In addition, his cardiac event monitor was fairly unremarkable and pulmonary function tests were normal. He has no symptoms at rest.   2.  Hypertension:  His blood pressure is moderately elevated despite full medication compliance. Given his diastolic dysfunction, he needs better blood pressure control. Unfortunately, he has not been able to tolerate beta blocker therapies in the past due to marked bradycardia. We will increase his amlodipine to 10 mg daily. He has been instructed to monitor his blood pressure closely over the next several days. He  is to keep a log of his blood pressure readings. If he continues to have elevated pressures or if he develops significant hypotension, he is to notify our office for further recommendations.  PLAN  F/u with Dr. [name] in 3-6 months.   Lyda Jester PA-C 06/27/2015 9:40 AM

## 2015-06-27 NOTE — Patient Instructions (Signed)
Medication Instructions:   Increase amlodipine to 10mg  daily    Follow-Up:  Dr. [name] recommends that you schedule a follow-up appointment in: 3 months    Any Other Special Instructions Will Be Listed Below (If Applicable).  Your physician has requested that you regularly monitor and record your blood pressure readings at home. Please use the same machine at the same time of day to check your readings and record them and call your readings into the office [phone].  Blood Pressure Record Sheet  BLOOD PRESSURE LOG Date: _______________________  a.m. _____________________  p.m. _____________________ Date: _______________________  a.m. _____________________  p.m. _____________________ Date: _______________________  a.m. _____________________  p.m. _____________________ Date: _______________________  a.m. _____________________  p.m. _____________________ Date: _______________________  a.m. _____________________  p.m. _____________________ Document Released: 06/23/2003 Document Revised: 02/08/2014 Document Reviewed: 11/17/2013 Exit[employer] Patient Information 2015 [place], [place]. This information is not intended to replace advice given to you by your health care provider. Make sure you discuss any questions you have with your health care provider.

## 2015-07-13 DIAGNOSIS — H524 Presbyopia: Secondary | ICD-10-CM | POA: Diagnosis not present

## 2015-07-29 DIAGNOSIS — Z905 Acquired absence of kidney: Secondary | ICD-10-CM | POA: Diagnosis not present

## 2015-07-29 DIAGNOSIS — R351 Nocturia: Secondary | ICD-10-CM | POA: Diagnosis not present

## 2015-07-29 DIAGNOSIS — N281 Cyst of kidney, acquired: Secondary | ICD-10-CM | POA: Diagnosis not present

## 2015-08-04 DIAGNOSIS — R42 Dizziness and giddiness: Secondary | ICD-10-CM | POA: Diagnosis not present

## 2015-08-04 DIAGNOSIS — R351 Nocturia: Secondary | ICD-10-CM | POA: Diagnosis not present

## 2015-08-04 DIAGNOSIS — R110 Nausea: Secondary | ICD-10-CM | POA: Diagnosis not present

## 2015-08-18 DIAGNOSIS — M542 Cervicalgia: Secondary | ICD-10-CM | POA: Diagnosis not present

## 2015-08-21 ENCOUNTER — Encounter (HOSPITAL_COMMUNITY): Payer: Self-pay | Admitting: Emergency Medicine

## 2015-08-21 ENCOUNTER — Emergency Department (HOSPITAL_COMMUNITY)
Admission: EM | Admit: 2015-08-21 | Discharge: 2015-08-21 | Disposition: A | Discharge status: Home / Self Care | Payer: Medicare Other | Attending: Emergency Medicine | Admitting: Emergency Medicine

## 2015-08-21 DIAGNOSIS — M069 Rheumatoid arthritis, unspecified: Secondary | ICD-10-CM | POA: Insufficient documentation

## 2015-08-21 DIAGNOSIS — Z9889 Other specified postprocedural states: Secondary | ICD-10-CM | POA: Diagnosis not present

## 2015-08-21 DIAGNOSIS — Z79899 Other long term (current) drug therapy: Secondary | ICD-10-CM | POA: Diagnosis not present

## 2015-08-21 DIAGNOSIS — M25512 Pain in left shoulder: Secondary | ICD-10-CM | POA: Diagnosis not present

## 2015-08-21 DIAGNOSIS — K219 Gastro-esophageal reflux disease without esophagitis: Secondary | ICD-10-CM | POA: Diagnosis not present

## 2015-08-21 DIAGNOSIS — E7800 Pure hypercholesterolemia, unspecified: Secondary | ICD-10-CM | POA: Insufficient documentation

## 2015-08-21 DIAGNOSIS — Z87891 Personal history of nicotine dependence: Secondary | ICD-10-CM | POA: Insufficient documentation

## 2015-08-21 DIAGNOSIS — N400 Enlarged prostate without lower urinary tract symptoms: Secondary | ICD-10-CM | POA: Insufficient documentation

## 2015-08-21 DIAGNOSIS — Z7982 Long term (current) use of aspirin: Secondary | ICD-10-CM | POA: Diagnosis not present

## 2015-08-21 DIAGNOSIS — I10 Essential (primary) hypertension: Secondary | ICD-10-CM | POA: Diagnosis not present

## 2015-08-21 DIAGNOSIS — M542 Cervicalgia: Secondary | ICD-10-CM | POA: Insufficient documentation

## 2015-08-21 DIAGNOSIS — Z85528 Personal history of other malignant neoplasm of kidney: Secondary | ICD-10-CM | POA: Insufficient documentation

## 2015-08-21 DIAGNOSIS — M25551 Pain in right hip: Secondary | ICD-10-CM | POA: Insufficient documentation

## 2015-08-21 DIAGNOSIS — M2550 Pain in unspecified joint: Secondary | ICD-10-CM

## 2015-08-21 DIAGNOSIS — Z7952 Long term (current) use of systemic steroids: Secondary | ICD-10-CM | POA: Diagnosis not present

## 2015-08-21 HISTORY — DX: Rheumatoid arthritis, unspecified: M06.9

## 2015-08-21 MED ORDER — HYDROCODONE-ACETAMINOPHEN 5-325 MG PO TABS: 1.0000 | ORAL_TABLET | ORAL | Status: DC | PRN

## 2015-08-21 MED ORDER — DEXAMETHASONE SODIUM PHOSPHATE 10 MG/ML IJ SOLN
10.0000 mg | Freq: Once | INTRAMUSCULAR | Status: AC
  Administered 2015-08-21: 10 mg via INTRAMUSCULAR
  Filled 2015-08-21: qty 1

## 2015-08-21 NOTE — ED Notes (Addendum)
Pt was seen at [facility] recently and was given Tramadol prescription for recently diagnosed rheumatoid arthritis. Says left neck and shoulder pain is now radiating to right hip and right leg. Says pain medication is not alleviating arthritic pain. Was seen by rheumatologist approx a year ago and given steroids along with another medication he cannot remember. Says this alleviated pain completely. Now is unable to walk without intense pain. No other c/c.

## 2015-08-21 NOTE — ED Provider Notes (Signed)
CSN: [id number]     Arrival date & time 08/21/15  1532 History   First MD Initiated Contact with Patient 08/21/15 1633     Chief Complaint  Patient presents with  . Rheumatoid Arthritis     (Consider location/radiation/quality/duration/timing/severity/associated sxs/prior Treatment) HPI Comments: Patient presents with joint pain. He states he has a history of rheumatoid arthritis. He is on prednisone 5 mg a day but no other medications for his rheumatoid arthritis. He states over the last week and half he's had some pain to his left neck. He states that is actually gotten a little bit better but now he has pain in his right hip. He states it hurts to walk on it. He denies any known injuries. He denies any fevers. There is no nausea or vomiting. He states it hurts to move and ambulate. He denies any numbness or weakness in his extremities. He states this is the same type of flareup he had previously with his rheumatoid arthritis although it was more in his right arm and point. He states he got a shot of steroid and that's what he is requesting today. He saw his PCP last week and was started on tramadol which she's been taking without relief in symptoms.   Past Medical History  Diagnosis Date  . Hypertension   . High cholesterol   . GERD (gastroesophageal reflux disease)   . BPH (benign prostatic hyperplasia)   . Renal cell cancer ([place])     s/p nephrectomy in 2000  . Arthritis, rheumatoid [facility])    Past Surgical History  Procedure Laterality Date  . Nephrectomy  10/1998  . Left heart catheterization with coronary angiogram N/A 11/19/2011    Procedure: LEFT HEART CATHETERIZATION WITH CORONARY ANGIOGRAM;  Surgeon: [name], MD;  Location: [facility] CATH LAB;  Service: Cardiovascular;  Laterality: N/A;   Family History  Problem Relation Age of Onset  . Heart attack Father 22  . Kidney failure Sister     Bright's disease    Social History  Substance Use Topics  . Smoking status: Former  Research scientist (life sciences)  . Smokeless tobacco: None  . Alcohol Use: No    Review of Systems  Constitutional: Negative for fever, chills, diaphoresis and fatigue.  HENT: Negative for congestion, rhinorrhea and sneezing.   Eyes: Negative.   Respiratory: Negative for cough, chest tightness and shortness of breath.   Cardiovascular: Negative for chest pain and leg swelling.  Gastrointestinal: Negative for nausea, vomiting, abdominal pain, diarrhea and blood in stool.  Genitourinary: Negative for frequency, hematuria, flank pain and difficulty urinating.  Musculoskeletal: Positive for myalgias and arthralgias. Negative for back pain and joint swelling.  Skin: Negative for rash.  Neurological: Negative for dizziness, speech difficulty, weakness, numbness and headaches.      Allergies  Review of patient's allergies indicates no known allergies.  Home Medications   Prior to Admission medications   Medication Sig Start Date End Date Taking? Authorizing Provider  amLODipine (NORVASC) 10 MG tablet Take 1 tablet (10 mg total) by mouth daily. 06/27/15  Yes Brittainy Erie Noe, PA-C  aspirin EC 81 MG tablet Take 81 mg by mouth daily.   Yes Historical Provider, MD  atorvastatin (LIPITOR) 80 MG tablet Take 80 mg by mouth daily.   Yes Historical Provider, MD  finasteride (PROSCAR) 5 MG tablet Take 5 mg by mouth daily.   Yes Historical Provider, MD  fish oil-omega-3 fatty acids 1000 MG capsule Take 2 g by mouth daily.    Yes  Historical Provider, MD  losartan-hydrochlorothiazide (HYZAAR) 100-12.5 MG per tablet Take 0.5 tablets by mouth daily. 03/05/15  Yes Historical Provider, MD  Multiple Vitamins-Minerals (MULTIVITAMIN WITH MINERALS) tablet Take 1 tablet by mouth daily.   Yes Historical Provider, MD  pantoprazole (PROTONIX) 40 MG tablet Take 40 mg by mouth 2 (two) times daily.   Yes Historical Provider, MD  predniSONE (DELTASONE) 5 MG tablet Take 5 mg by mouth daily with breakfast.    Yes Historical Provider, MD   traMADol (ULTRAM) 50 MG tablet Take 50 mg by mouth 4 (four) times daily as needed for moderate pain.  08/18/15  Yes Historical Provider, MD  HYDROcodone-acetaminophen (NORCO/VICODIN) 5-325 MG tablet Take 1-2 tablets by mouth every 4 (four) hours as needed. 08/21/15   Malvin Johns, MD   BP 126/82 mmHg  Pulse 81  Temp(Src) 97.6 F (36.4 C) (Oral)  Resp 16  SpO2 100% Physical Exam  Constitutional: He is oriented to person, place, and time. He appears well-developed and well-nourished.  HENT:  Head: Normocephalic and atraumatic.  Eyes: Pupils are equal, round, and reactive to light.  Neck: Normal range of motion. Neck supple.  Cardiovascular: Normal rate, regular rhythm and normal heart sounds.   Pulmonary/Chest: Effort normal and breath sounds normal. No respiratory distress. He has no wheezes. He has no rales. He exhibits no tenderness.  Abdominal: Soft. Bowel sounds are normal. There is no tenderness. There is no rebound and no guarding.  Musculoskeletal: Normal range of motion. He exhibits no edema.  Positive tenderness over the left trapezius muscle. There is no bony tenderness along the spine. There is no pain to the shoulder joint. There is some tenderness to the lateral aspect of his right hip. There is no pain on range of motion of the hip. There is no joint swelling or effusions noted. No warmth or erythema over the joints. He has normal motor function and sensation in the extremities. Radial pulses are intact bilaterally  Lymphadenopathy:    He has no cervical adenopathy.  Neurological: He is alert and oriented to person, place, and time.  Skin: Skin is warm and dry. No rash noted.  Psychiatric: He has a normal mood and affect.    ED Course  Procedures (including critical care time) Labs Review Labs Reviewed - No data to display  Imaging Review No results found. I have personally reviewed and evaluated these images and lab results as part of my medical decision-making.    EKG Interpretation None      MDM   Final diagnoses:  Arthralgia    Patient presents with pain over his left trapezius and right lateral hip. There is no associated trauma. I don't appreciate any joint swelling. There is no significant pain on range of motion of the hip joint. I don't feel that imaging studies are indicated. He is afebrile without signs of septic joint. He was given a dose of Decadron in the ED. He was given a prescription for hydrocodone to use for pain. He was encouraged to follow-up with his PCP within the next few days if his symptoms are not improving or return here as needed if he has any worsening symptoms.    Malvin Johns, MD 08/21/15 1710

## 2015-08-21 NOTE — Discharge Instructions (Signed)

## 2015-08-24 DIAGNOSIS — M79605 Pain in left leg: Secondary | ICD-10-CM | POA: Diagnosis not present

## 2015-08-24 DIAGNOSIS — M160 Bilateral primary osteoarthritis of hip: Secondary | ICD-10-CM | POA: Diagnosis not present

## 2015-08-24 DIAGNOSIS — M25559 Pain in unspecified hip: Secondary | ICD-10-CM | POA: Diagnosis not present

## 2015-08-24 DIAGNOSIS — M25569 Pain in unspecified knee: Secondary | ICD-10-CM | POA: Diagnosis not present

## 2015-08-24 DIAGNOSIS — M47816 Spondylosis without myelopathy or radiculopathy, lumbar region: Secondary | ICD-10-CM | POA: Diagnosis not present

## 2015-08-24 DIAGNOSIS — M170 Bilateral primary osteoarthritis of knee: Secondary | ICD-10-CM | POA: Diagnosis not present

## 2015-08-25 DIAGNOSIS — M5136 Other intervertebral disc degeneration, lumbar region: Secondary | ICD-10-CM | POA: Diagnosis not present

## 2015-08-25 DIAGNOSIS — M549 Dorsalgia, unspecified: Secondary | ICD-10-CM | POA: Diagnosis not present

## 2015-08-31 DIAGNOSIS — M5136 Other intervertebral disc degeneration, lumbar region: Secondary | ICD-10-CM | POA: Diagnosis not present

## 2015-08-31 DIAGNOSIS — N181 Chronic kidney disease, stage 1: Secondary | ICD-10-CM | POA: Diagnosis not present

## 2015-09-05 ENCOUNTER — Encounter (HOSPITAL_COMMUNITY): Payer: Self-pay | Admitting: Family Medicine

## 2015-09-05 ENCOUNTER — Emergency Department (HOSPITAL_COMMUNITY)
Admission: EM | Admit: 2015-09-05 | Discharge: 2015-09-05 | Disposition: A | Discharge status: Home / Self Care | Payer: Medicare Other | Attending: Emergency Medicine | Admitting: Emergency Medicine

## 2015-09-05 ENCOUNTER — Emergency Department (HOSPITAL_COMMUNITY): Payer: Medicare Other

## 2015-09-05 ENCOUNTER — Encounter (HOSPITAL_COMMUNITY): Payer: Self-pay | Admitting: Emergency Medicine

## 2015-09-05 DIAGNOSIS — E7800 Pure hypercholesterolemia, unspecified: Secondary | ICD-10-CM | POA: Diagnosis not present

## 2015-09-05 DIAGNOSIS — M7631 Iliotibial band syndrome, right leg: Secondary | ICD-10-CM

## 2015-09-05 DIAGNOSIS — R000 Tachycardia, unspecified: Secondary | ICD-10-CM | POA: Diagnosis not present

## 2015-09-05 DIAGNOSIS — I10 Essential (primary) hypertension: Secondary | ICD-10-CM | POA: Insufficient documentation

## 2015-09-05 DIAGNOSIS — Z87891 Personal history of nicotine dependence: Secondary | ICD-10-CM | POA: Diagnosis not present

## 2015-09-05 DIAGNOSIS — K219 Gastro-esophageal reflux disease without esophagitis: Secondary | ICD-10-CM | POA: Insufficient documentation

## 2015-09-05 DIAGNOSIS — R0682 Tachypnea, not elsewhere classified: Secondary | ICD-10-CM | POA: Diagnosis not present

## 2015-09-05 DIAGNOSIS — Z7982 Long term (current) use of aspirin: Secondary | ICD-10-CM | POA: Diagnosis not present

## 2015-09-05 DIAGNOSIS — Z905 Acquired absence of kidney: Secondary | ICD-10-CM | POA: Insufficient documentation

## 2015-09-05 DIAGNOSIS — Z9889 Other specified postprocedural states: Secondary | ICD-10-CM | POA: Diagnosis not present

## 2015-09-05 DIAGNOSIS — M25551 Pain in right hip: Secondary | ICD-10-CM | POA: Diagnosis not present

## 2015-09-05 DIAGNOSIS — Z79899 Other long term (current) drug therapy: Secondary | ICD-10-CM | POA: Insufficient documentation

## 2015-09-05 DIAGNOSIS — S79911A Unspecified injury of right hip, initial encounter: Secondary | ICD-10-CM | POA: Diagnosis not present

## 2015-09-05 DIAGNOSIS — M069 Rheumatoid arthritis, unspecified: Secondary | ICD-10-CM | POA: Insufficient documentation

## 2015-09-05 DIAGNOSIS — N400 Enlarged prostate without lower urinary tract symptoms: Secondary | ICD-10-CM | POA: Insufficient documentation

## 2015-09-05 DIAGNOSIS — Z85528 Personal history of other malignant neoplasm of kidney: Secondary | ICD-10-CM | POA: Insufficient documentation

## 2015-09-05 DIAGNOSIS — Z7952 Long term (current) use of systemic steroids: Secondary | ICD-10-CM | POA: Insufficient documentation

## 2015-09-05 DIAGNOSIS — M79651 Pain in right thigh: Secondary | ICD-10-CM | POA: Diagnosis not present

## 2015-09-05 DIAGNOSIS — R0602 Shortness of breath: Secondary | ICD-10-CM | POA: Diagnosis not present

## 2015-09-05 DIAGNOSIS — M79604 Pain in right leg: Secondary | ICD-10-CM | POA: Diagnosis present

## 2015-09-05 LAB — CBC
HCT: 42.3 % (ref 39.0–52.0)
HEMOGLOBIN: 14.9 g/dL (ref 13.0–17.0)
MCH: 32 pg (ref 26.0–34.0)
MCHC: 35.2 g/dL (ref 30.0–36.0)
MCV: 90.8 fL (ref 78.0–100.0)
Platelets: 168 10*3/uL (ref 150–400)
RBC: 4.66 MIL/uL (ref 4.22–5.81)
RDW: 13.4 % (ref 11.5–15.5)
WBC: 11.5 10*3/uL — ABNORMAL HIGH (ref 4.0–10.5)

## 2015-09-05 LAB — BASIC METABOLIC PANEL
ANION GAP: 9 (ref 5–15)
BUN: 16 mg/dL (ref 6–20)
CALCIUM: 9.8 mg/dL (ref 8.9–10.3)
CHLORIDE: 101 mmol/L (ref 101–111)
CO2: 24 mmol/L (ref 22–32)
Creatinine, Ser: 1.52 mg/dL — ABNORMAL HIGH (ref 0.61–1.24)
GFR calc Af Amer: 48 mL/min — ABNORMAL LOW (ref >60)
GFR calc non Af Amer: 42 mL/min — ABNORMAL LOW (ref >60)
Glucose, Bld: 175 mg/dL — ABNORMAL HIGH (ref 65–99)
Potassium: 4.1 mmol/L (ref 3.5–5.1)
SODIUM: 134 mmol/L — AB (ref 135–145)

## 2015-09-05 LAB — I-STAT TROPONIN, ED: TROPONIN I, POC: 0.03 ng/mL (ref 0.00–0.08)

## 2015-09-05 MED ORDER — VOLTAREN 1 % TD GEL: 2.0000 g | Freq: Four times a day (QID) | TRANSDERMAL | Status: DC

## 2015-09-05 MED ORDER — CAPSAICIN-MENTHOL-METHYL SAL 0.025-1-12 % EX CREA: 1.0000 "application " | TOPICAL_CREAM | Freq: Four times a day (QID) | CUTANEOUS | Status: DC | PRN

## 2015-09-05 MED ORDER — SODIUM CHLORIDE 0.9 % IV BOLUS (SEPSIS): 1000.0000 mL | Freq: Once | INTRAVENOUS | Status: DC

## 2015-09-05 MED ORDER — SODIUM CHLORIDE 0.9 % IV BOLUS (SEPSIS)
500.0000 mL | Freq: Once | INTRAVENOUS | Status: AC
  Administered 2015-09-05: 500 mL via INTRAVENOUS

## 2015-09-05 MED ORDER — NAPROXEN 500 MG PO TABS: 500.0000 mg | ORAL_TABLET | Freq: Two times a day (BID) | ORAL | Status: DC

## 2015-09-05 MED ORDER — IOHEXOL 350 MG/ML SOLN
80.0000 mL | Freq: Once | INTRAVENOUS | Status: AC | PRN
  Administered 2015-09-05: 80 mL via INTRAVENOUS

## 2015-09-05 MED ORDER — FENTANYL CITRATE (PF) 100 MCG/2ML IJ SOLN
50.0000 ug | Freq: Once | INTRAMUSCULAR | Status: AC
  Administered 2015-09-05: 50 ug via INTRAVENOUS
  Filled 2015-09-05: qty 2

## 2015-09-05 NOTE — ED Provider Notes (Signed)
CSN: [id number]     Arrival date & time 09/05/15  [id number] History   First MD Initiated Contact with Patient 09/05/15 [phone]     Chief Complaint  Patient presents with  . Leg Pain     (Consider location/radiation/quality/duration/timing/severity/associated sxs/prior Treatment) Patient is a 81 y.o. male presenting with leg pain. The history is provided by the patient.  Leg Pain Location:  Hip Time since incident:  2 days Injury: no   Hip location:  R hip Pain details:    Quality:  Sharp   Radiates to:  Back   Severity:  Moderate   Onset quality:  Gradual   Duration:  2 days   Timing:  Constant   Progression:  Unchanged Chronicity:  New (with previous pain in back and left shoulder) Dislocation: no   Foreign body present:  No foreign bodies Prior injury to area:  No Relieved by:  Nothing Worsened by:  Activity and bearing weight Ineffective treatments:  None tried Associated symptoms: stiffness   Associated symptoms: no fever, no numbness, no swelling and no tingling     Past Medical History  Diagnosis Date  . Hypertension   . High cholesterol   . GERD (gastroesophageal reflux disease)   . BPH (benign prostatic hyperplasia)   . Renal cell cancer ([place])     s/p nephrectomy in 2000  . Arthritis, rheumatoid [facility])    Past Surgical History  Procedure Laterality Date  . Nephrectomy  10/1998  . Left heart catheterization with coronary angiogram N/A 11/19/2011    Procedure: LEFT HEART CATHETERIZATION WITH CORONARY ANGIOGRAM;  Surgeon: [name], MD;  Location: [facility] CATH LAB;  Service: Cardiovascular;  Laterality: N/A;   Family History  Problem Relation Age of Onset  . Heart attack Father 17  . Kidney failure Sister     Bright's disease    Social History  Substance Use Topics  . Smoking status: Former Research scientist (life sciences)  . Smokeless tobacco: None  . Alcohol Use: No    Review of Systems  Constitutional: Negative for fever.  Musculoskeletal: Positive for stiffness.  All other  systems reviewed and are negative.     Allergies  Review of patient's allergies indicates no known allergies.  Home Medications   Prior to Admission medications   Medication Sig Start Date End Date Taking? Authorizing Provider  amLODipine (NORVASC) 10 MG tablet Take 1 tablet (10 mg total) by mouth daily. 06/27/15   Brittainy Erie Noe, PA-C  aspirin EC 81 MG tablet Take 81 mg by mouth daily.    Historical Provider, MD  atorvastatin (LIPITOR) 80 MG tablet Take 80 mg by mouth daily.    Historical Provider, MD  finasteride (PROSCAR) 5 MG tablet Take 5 mg by mouth daily.    Historical Provider, MD  fish oil-omega-3 fatty acids 1000 MG capsule Take 2 g by mouth daily.     Historical Provider, MD  HYDROcodone-acetaminophen (NORCO/VICODIN) 5-325 MG tablet Take 1-2 tablets by mouth every 4 (four) hours as needed. 08/21/15   Malvin Johns, MD  losartan-hydrochlorothiazide (HYZAAR) 100-12.5 MG per tablet Take 0.5 tablets by mouth daily. 03/05/15   Historical Provider, MD  Multiple Vitamins-Minerals (MULTIVITAMIN WITH MINERALS) tablet Take 1 tablet by mouth daily.    Historical Provider, MD  pantoprazole (PROTONIX) 40 MG tablet Take 40 mg by mouth 2 (two) times daily.    Historical Provider, MD  predniSONE (DELTASONE) 5 MG tablet Take 5 mg by mouth daily with breakfast.     Historical Provider, MD  traMADol (ULTRAM) 50 MG tablet Take 50 mg by mouth 4 (four) times daily as needed for moderate pain.  08/18/15   Historical Provider, MD   BP 147/89 mmHg  Pulse 83  Temp(Src) 97.9 F (36.6 C) (Oral)  Resp 18  Ht 6' (1.829 m)  Wt 214 lb (97.07 kg)  BMI 29.02 kg/m2  SpO2 97% Physical Exam  Constitutional: He is oriented to person, place, and time. He appears well-developed and well-nourished. No distress.  HENT:  Head: Normocephalic and atraumatic.  Eyes: Conjunctivae are normal.  Neck: Neck supple. No tracheal deviation present.  Cardiovascular: Normal rate and regular rhythm.   Pulmonary/Chest:  Effort normal. No respiratory distress.  Abdominal: Soft. He exhibits no distension.  Musculoskeletal:       Right upper leg: He exhibits tenderness (over lateral hip and thigh).  Neurological: He is alert and oriented to person, place, and time.  Skin: Skin is warm and dry.  Psychiatric: He has a normal mood and affect.    ED Course  Procedures (including critical care time) Labs Review Labs Reviewed - No data to display  Imaging Review No results found. I have personally reviewed and evaluated these images and lab results as part of my medical decision-making.   EKG Interpretation None      MDM   Final diagnoses:  IT band syndrome, right    81 y.o. male presents with right outer leg pain, is point tender over IT band with typical symptomatic distribution. Heat has not helped symptoms. Has not been able to exercise. Recommended addition of NSAIDs to narcotic therapy in short term to help with inflammation, recommended evaluation by orthopedic sports medicine for ongoing symptoms. No indication for imaging without new traumatic injury. Plan to follow up with PCP as needed and return precautions discussed for worsening or new concerning symptoms.     Leo Grosser, MD 09/05/15 [phone]

## 2015-09-05 NOTE — ED Notes (Signed)
Patient states he has had right leg pain since yesterday. Reports difficulty walking. Hx of arthritis. Denies injury/trauma to leg.

## 2015-09-05 NOTE — ED Notes (Signed)
Patient transported to CT 

## 2015-09-05 NOTE — Discharge Instructions (Signed)
Iliotibial Band Syndrome With Rehab The iliotibial (IT) band is a tendon that connects the hip muscles to the shinbone (tibia) and to one of the bones of the pelvis (ileum). The IT band passes by the knee and is often irritated by the outer portion of the knee (lateral femoral condyle). A fluid filled sac (bursa) exists between the tendon and the bone, to cushion and reduce friction. Overuse of the tendon may cause excessive friction, which results in IT band syndrome. This condition involves inflammation of the bursa (bursitis) and/or inflammation of the IT band (tendinitis). SYMPTOMS   Pain, tenderness, swelling, warmth, or redness over the IT band, at the outer knee (above the joint).  Pain that travels up or down the thigh or leg.  Initially, pain at the beginning of an exercise, that decreases once warmed up. Eventually, pain throughout the activity, getting worse as the activity continues. May cause the athlete to stop in the middle of training or competing.  Pain that gets worse when running down hills or stairs, on banked tracks, or next to the curb on the street.  Pain that increases when the foot of the affected leg hits the ground.  Possibly, a crackling sound (crepitation) when the tendon or bursa is moved or touched. CAUSES  IT band syndrome is caused by irritation of the IT band and the underlying bursa. This eventually results in inflammation and pain. IT band syndrome is an overuse injury.  RISK INCREASES WITH:  Sports with repetitive knee-bending activities (distance running, cycling).  Incorrect training techniques, including sudden changes in the intensity, frequency, or duration of training.  Not enough rest between workouts.  Poor strength and flexibility, especially a tight IT band.  Failure to warm up properly before activity.  Bow legs.  Arthritis of the knee. PREVENTION   Warm up and stretch properly before activity.  Allow for adequate recovery between  workouts.  Maintain physical fitness:  Strength, flexibility, and endurance.  Cardiovascular fitness.  Learn and use proper training technique, including reducing running mileage, shortening stride, and avoiding running on hills and banked surfaces.  Wear arch supports (orthotics), if you have flat feet. PROGNOSIS  If treated properly, IT band syndrome usually goes away within 6 weeks of treatment. RELATED COMPLICATIONS   Longer healing time, if not properly treated, or if not given enough time to heal.  Recurring inflammation of the tendon and bursa, that may result in a chronic condition.  Recurring symptoms, if activity is resumed too soon, with overuse, with a direct blow, or with poor training technique.  Inability to complete training or competition. TREATMENT  Treatment first involves the use of ice and medicine, to reduce pain and inflammation. The use of strengthening and stretching exercises may help reduce pain with activity. These exercises may be performed at home or with a therapist. For individuals with flat feet, an arch support (orthotic) may be helpful. Some individuals find that wearing a knee sleeve or compression bandage around the knee during workouts provides some relief. Certain training techniques, such as adjusting stride length, avoiding running on hills or stairs, changing the direction you run on a circular or banked track, or changing the side of the road you run on, if you run next to the curb, may help decrease symptoms of IT band syndrome. Cyclists may need to change the seat height or foot position on their bicycles. An injection of cortisone into the bursa may be recommended. Surgery to remove the inflamed bursa and/or part   of the IT band is only considered after at least 6 months of non-surgical treatment.  MEDICATION   If pain medicine is needed, nonsteroidal anti-inflammatory medicines (aspirin and ibuprofen), or other minor pain relievers  (acetaminophen), are often advised.  Do not take pain medicine for 7 days before surgery.  Prescription pain relievers may be given, if your caregiver thinks they are needed. Use only as directed and only as much as you need.  Corticosteroid injections may be given by your caregiver. These injections should be reserved for the most serious cases, because they may only be given a certain number of times. HEAT AND COLD  Cold treatment (icing) should be applied for 10 to 15 minutes every 2 to 3 hours for inflammation and pain, and immediately after activity that aggravates your symptoms. Use ice packs or an ice massage.  Heat treatment may be used before performing stretching and strengthening activities prescribed by your caregiver, physical therapist, or athletic trainer. Use a heat pack or a warm water soak. SEEK MEDICAL CARE IF:   Symptoms get worse or do not improve in 2 to 4 weeks, despite treatment.  New, unexplained symptoms develop. (Drugs used in treatment may produce side effects.) EXERCISES  RANGE OF MOTION (ROM) AND STRETCHING EXERCISES - Iliotibial Band Syndrome These exercises may help you when beginning to rehabilitate your injury. Your symptoms may go away with or without further involvement from your physician, physical therapist or athletic trainer. While completing these exercises, remember:   Restoring tissue flexibility helps normal motion to return to the joints. This allows healthier, less painful movement and activity.  An effective stretch should be held for at least 30 seconds.  A stretch should never be painful. You should only feel a gentle lengthening or release in the stretched tissue. STRETCH - Quadriceps, Prone   Lie on your stomach on a firm surface, such as a bed or padded floor.  Bend your right / left knee and grasp your ankle. If you are unable to reach your ankle or pant leg, use a belt around your foot to lengthen your reach.  Gently pull your heel  toward your buttocks. Your knee should not slide out to the side. You should feel a stretch in the front of your thigh and knee.  Hold this position for __________ seconds. Repeat __________ times. Complete this stretch __________ times per day.  STRETCH - Iliotibial Band  On the floor or bed, lie on your side, so your right / left leg is on top. Bend your knee and grab your ankle.  Slowly bring your knee back so that your thigh is in line with your trunk. Keep your heel at your buttocks and gently arch your back, so your head, shoulders and hips line up.  Slowly lower your leg so that your knee approaches the floor or bed, until you feel a gentle stretch on the outside of your right / left thigh. If you do not feel a stretch and your knee will not fall farther, place the heel of your opposite foot on top of your knee, and pull your thigh down farther.  Hold this stretch for __________ seconds. Repeat __________ times. Complete this stretch __________ times per day. STRENGTHENING EXERCISES - Iliotibial Band Syndrome Improving the flexibility of the IT band will best relieve your discomfort due to IT band syndrome. Strengthening exercises, however, can help improve both muscle endurance and joint mechanics, reducing the factors that can contribute to this condition. Your physician, physical   therapist or athletic trainer may provide you with exercises that train specific muscle groups that are especially weak. The following exercises target muscles that are often weak in people who have IT band syndrome. STRENGTH - Hip Abductors, Straight Leg Raises  Be aware of your form throughout the entire exercise, so that you exercise the correct muscles. Poor form means that you are not strengthening the correct muscles.  Lie on your side, so that your head, shoulders, knee and hip line up. You may bend your lower knee to help maintain your balance. Your right / left leg should be on top.  Roll your hips  slightly forward, so that your hips are stacked directly over each other and your right / left knee is facing forward.  Lift your top leg up 4-6 inches, leading with your heel. Be sure that your foot does not drift forward and that your knee does not roll toward the ceiling.  Hold this position for __________ seconds. You should feel the muscles in your outer hip lifting (you may not notice this until your leg begins to tire).  Slowly lower your leg to the starting position. Allow the muscles to fully relax before beginning the next repetition. Repeat __________ times. Complete this exercise __________ times per day.  STRENGTH - Quad/VMO, Isometric  Sit in a chair with your right / left knee slightly bent. With your fingertips, feel the VMO muscle (just above the inside of your knee). The VMO is important in controlling the position of your kneecap.  Keeping your fingertips on this muscle. Without actually moving your leg, attempt to drive your knee down, as if straightening your leg. You should feel your VMO tense. If you have a difficult time, you may wish to try the same exercise on your healthy knee first.  Tense this muscle as hard as you can, without increasing any knee pain.  Hold for __________ seconds. Relax the muscles slowly and completely between each repetition. Repeat __________ times. Complete this exercise __________ times per day.    This information is not intended to replace advice given to you by your health care provider. Make sure you discuss any questions you have with your health care provider.   Document Released: 09/24/2005 Document Revised: 10/15/2014 Document Reviewed: 01/06/2009 Elsevier Interactive Patient Education 2016 [employer]  

## 2015-09-05 NOTE — ED Provider Notes (Signed)
Patient seen and evaluated. No apparent discomfort here. He is able to move about the room. He is able to sit and stand. Can cross and across his legs. Likely not in congestive heart failure. No crackles. No dependent edema. No S4 gallop. No JVD.  Patient appropriate for out patient treatment. I recommended orthopedic follow-up regarding his chronic hip pain. Continue his hydrocodone that is prescribed by primary care.  Tanna Furry, MD 09/05/15 [phone]

## 2015-09-05 NOTE — ED Notes (Signed)
AVS explained in detail. Knows to follow up with PCP for sports medicine referral. Knows to take medication as prescribed and use heat/ice therapy. No other c/c.

## 2015-09-05 NOTE — ED Provider Notes (Signed)
CSN: [id number]     Arrival date & time 09/05/15  1141 History   First MD Initiated Contact with Patient 09/05/15 1252     Chief Complaint  Patient presents with  . Leg Pain  . Shortness of Breath   Patient is a 81 y.o. male presenting with shortness of breath. The history is provided by the patient.  Shortness of Breath Severity:  Moderate Onset quality:  Sudden Timing:  Constant Progression:  Unchanged Chronicity:  New Relieved by:  Nothing Worsened by:  Exertion Associated symptoms: no abdominal pain, no chest pain, no cough, no fever, no headaches, no hemoptysis, no neck pain, no rash, no sore throat and no vomiting   Risk factors: no family hx of DVT and no hx of cancer     Past Medical History  Diagnosis Date  . Hypertension   . High cholesterol   . GERD (gastroesophageal reflux disease)   . BPH (benign prostatic hyperplasia)   . Renal cell cancer ([place])     s/p nephrectomy in 2000  . Arthritis, rheumatoid [facility])    Past Surgical History  Procedure Laterality Date  . Nephrectomy  10/1998  . Left heart catheterization with coronary angiogram N/A 11/19/2011    Procedure: LEFT HEART CATHETERIZATION WITH CORONARY ANGIOGRAM;  Surgeon: [name], MD;  Location: [facility] CATH LAB;  Service: Cardiovascular;  Laterality: N/A;   Family History  Problem Relation Age of Onset  . Heart attack Father 28  . Kidney failure Sister     Bright's disease    Social History  Substance Use Topics  . Smoking status: Former Research scientist (life sciences)  . Smokeless tobacco: None  . Alcohol Use: No    Review of Systems  Constitutional: Negative for fever and chills.  HENT: Negative for rhinorrhea and sore throat.   Eyes: Negative for visual disturbance.  Respiratory: Positive for shortness of breath. Negative for cough and hemoptysis.   Cardiovascular: Negative for chest pain.  Gastrointestinal: Negative for nausea, vomiting, abdominal pain, diarrhea and constipation.  Genitourinary: Negative for dysuria  and hematuria.  Musculoskeletal: Positive for arthralgias. Negative for back pain and neck pain.  Skin: Negative for rash.  Neurological: Negative for syncope and headaches.  Psychiatric/Behavioral: Negative for confusion.  All other systems reviewed and are negative.  Allergies  Review of patient's allergies indicates no known allergies.  Home Medications   Prior to Admission medications   Medication Sig Start Date End Date Taking? Authorizing Provider  amLODipine (NORVASC) 10 MG tablet Take 1 tablet (10 mg total) by mouth daily. 06/27/15  Yes Brittainy Erie Noe, PA-C  aspirin EC 81 MG tablet Take 81 mg by mouth daily.   Yes Historical Provider, MD  atorvastatin (LIPITOR) 80 MG tablet Take 80 mg by mouth daily.   Yes Historical Provider, MD  Capsaicin-Menthol-Methyl Sal (CAPSAICIN-METHYL SAL-MENTHOL) 0.025-1-12 % CREA Apply 1 application topically 4 (four) times daily as needed (leg pain). 09/05/15  Yes Leo Grosser, MD  finasteride (PROSCAR) 5 MG tablet Take 5 mg by mouth daily.   Yes Historical Provider, MD  fish oil-omega-3 fatty acids 1000 MG capsule Take 2 g by mouth daily.    Yes Historical Provider, MD  gabapentin (NEURONTIN) 300 MG capsule Take 300 mg by mouth 3 (three) times daily. 08/31/15  Yes Historical Provider, MD  HYDROcodone-acetaminophen (NORCO/VICODIN) 5-325 MG tablet Take 1-2 tablets by mouth every 4 (four) hours as needed. 08/21/15  Yes Malvin Johns, MD  losartan-hydrochlorothiazide (HYZAAR) 100-12.5 MG per tablet Take 0.5 tablets by  mouth daily. 03/05/15  Yes Historical Provider, MD  Multiple Vitamins-Minerals (MULTIVITAMIN WITH MINERALS) tablet Take 1 tablet by mouth daily.   Yes Historical Provider, MD  pantoprazole (PROTONIX) 40 MG tablet Take 40 mg by mouth 2 (two) times daily.   Yes Historical Provider, MD  predniSONE (DELTASONE) 5 MG tablet Take 5 mg by mouth daily with breakfast.    Yes Historical Provider, MD  traMADol (ULTRAM) 50 MG tablet Take 50 mg by mouth  4 (four) times daily as needed for moderate pain.  08/18/15  Yes Historical Provider, MD  naproxen (NAPROSYN) 500 MG tablet Take 1 tablet (500 mg total) by mouth 2 (two) times daily with a meal. 09/05/15   Leo Grosser, MD  VOLTAREN 1 % GEL Apply 2 g topically 4 (four) times daily. 09/05/15   Leo Grosser, MD   BP 153/93 mmHg  Pulse 102  Temp(Src) 98.1 F (36.7 C)  Resp 24  SpO2 98% Physical Exam  Constitutional: He is oriented to person, place, and time. He appears well-developed and well-nourished. No distress.  HENT:  Head: Normocephalic and atraumatic.  Mouth/Throat: Oropharynx is clear and moist.  Eyes: EOM are normal.  Neck: Neck supple. No JVD present.  Cardiovascular: Normal rate, regular rhythm, normal heart sounds and intact distal pulses.   Pulmonary/Chest: Effort normal and breath sounds normal. Tachypnea noted.  Abdominal: Soft. He exhibits no distension. There is no tenderness.  Musculoskeletal: Normal range of motion. He exhibits no edema.       Right hip: He exhibits tenderness. He exhibits normal range of motion and normal strength.  No antalgic gait  Neurological: He is alert and oriented to person, place, and time. No cranial nerve deficit.  Skin: Skin is warm and dry.  Psychiatric: His behavior is normal.    ED Course  Procedures  None   Labs Review Labs Reviewed  BASIC METABOLIC PANEL - Abnormal; Notable for the following:    Sodium 134 (*)    Glucose, Bld 175 (*)    Creatinine, Ser 1.52 (*)    GFR calc non Af Amer 42 (*)    GFR calc Af Amer 48 (*)    All other components within normal limits  CBC - Abnormal; Notable for the following:    WBC 11.5 (*)    All other components within normal limits  Randolm Idol, ED    Imaging Review Dg Chest 2 View  09/05/2015  CLINICAL DATA:  Shortness of breath. EXAM: CHEST  2 VIEW COMPARISON:  04/28/2015. FINDINGS: Normal sized heart. Tortuous aorta. Clear lungs. The lungs remain mildly hyperexpanded with  mild diffuse peribronchial thickening. Mild thoracic spine degenerative changes. IMPRESSION: No acute abnormality. Stable mild changes of COPD and chronic bronchitis. Electronically Signed   By: Claudie Revering M.D.   On: 09/05/2015 13:17   Dg Pelvis 1-2 Views  09/05/2015  CLINICAL DATA:  Right hip pain for 2 days with no known injury EXAM: PELVIS - 1-2 VIEW COMPARISON:  11/01/2014 FINDINGS: Osteopenia without fracture or diastasis. Both hips are located. No unexpected joint narrowing or degenerative spurring at the hips. Advanced lower lumbar disc and facet degeneration with spurring. Diffuse atherosclerosis. IMPRESSION: No acute finding. Electronically Signed   By: Monte Fantasia M.D.   On: 09/05/2015 15:03   Ct Angio Chest Pe W/cm &/or Wo Cm  09/05/2015  CLINICAL DATA:  One day history of shortness of breath and tachycardia. History of renal cell carcinoma EXAM: CT ANGIOGRAPHY CHEST WITH CONTRAST TECHNIQUE: Multidetector CT  imaging of the chest was performed using the standard protocol during bolus administration of intravenous contrast. Multiplanar CT image reconstructions and MIPs were obtained to evaluate the vascular anatomy. CONTRAST:  7mL OMNIPAQUE IOHEXOL 350 MG/ML SOLN COMPARISON:  Chest radiograph September 05, 2015 FINDINGS: There is no demonstrable pulmonary embolus. The ascending thoracic aortic diameter measures 4.3 x 4.1 cm. There is no demonstrable thoracic aortic dissection. There are scattered foci of atherosclerotic change in aorta. Visualized great vessels appear normal except for scattered foci of atherosclerotic calcification. There is interstitial edema in the lung bases. There is patchy alveolar opacity in both lower lobes as well as in the inferior lingula and right middle lobe, likely alveolar edema. There is left ventricular hypertrophy. The pericardium is not thickened. There are scattered foci of coronary artery calcification. There is an 8 x 8 mm nodular opacity in the right  lobe of thyroid. Visualized thyroid otherwise appears unremarkable. There are multiple subcentimeter lymph nodes in the mediastinum. There is no frank adenopathy by size criteria. The visualized upper abdominal structures appear normal except for atherosclerotic calcification at the origin of the celiac axis causing moderate narrowing at the origin of this vessel. There is degenerative change in the thoracic spine. There are no blastic or lytic bone lesions. Review of the MIP images confirms the above findings. IMPRESSION: No demonstrable pulmonary embolus. Bibasilar interstitial and alveolar edema, felt to represent a degree of congestive heart failure. Left ventricular hypertrophy. Scattered foci of coronary artery calcification. Pericardium not thickened. Prominence of the ascending thoracic aorta with maximum transverse diameter of 4.1 x 4.3 cm. Recommend annual imaging followup by CTA or MRA. This recommendation follows 2010 ACCF/AHA/AATS/ACR/ASA/SCA/SCAI/SIR/STS/SVM Guidelines for the Diagnosis and Management of Patients with Thoracic Aortic Disease. Circulation. 2010; 121: [id number] 8 mm nodular lesion right lobe of thyroid. Subcentimeter lymph nodes scattered throughout the mediastinum. No frank adenopathy by size criteria. Electronically Signed   By: Lowella Grip III M.D.   On: 09/05/2015 14:50   I have personally reviewed and evaluated these images and lab results as part of my medical decision-making.  MDM   Final diagnoses:  SOB (shortness of breath)  Tachycardia  Right hip pain    81 yo M with a PMH of HTN, hypercholesterolemia, GERD, BPH an RH who presents with right hip pain and SOB. Patient felt like he was going to "breath my last breath" this morning. Tachycardic here. Still symptomatic. No DVT risk factors. Not anticoagulated. PE still high on differential. Lungs clear. CXR unremarkable.   I reviewed the EKG. Sinus tachycardia, rate 105, no ectopy, no BBB, no ST elevation or  depression. No wellns, no brugada, intervals maintained.  CXR reviewed independently by me. No focal findings. CT with pulmonary edema, no PE. Clinically does not appear to be in heart failure. Will provide gentle IVF bolus given single kidney after contrasted CT. Otherwise, will given him f/u instructions to see orthopedics for his chronic hip pain.  Discussed with Dr. Jeneen Rinks.   Gustavus Bryant, MD 09/05/15 [place], MD 09/14/15 [phone]

## 2015-09-05 NOTE — ED Notes (Signed)
Pt here for right hip, leg pain. sts shaky and SOB.

## 2015-09-08 DIAGNOSIS — F5104 Psychophysiologic insomnia: Secondary | ICD-10-CM | POA: Diagnosis not present

## 2015-09-08 DIAGNOSIS — I10 Essential (primary) hypertension: Secondary | ICD-10-CM | POA: Diagnosis not present

## 2015-09-08 DIAGNOSIS — M79604 Pain in right leg: Secondary | ICD-10-CM | POA: Diagnosis not present

## 2015-09-08 DIAGNOSIS — M47816 Spondylosis without myelopathy or radiculopathy, lumbar region: Secondary | ICD-10-CM | POA: Diagnosis not present

## 2015-09-15 DIAGNOSIS — M4800 Spinal stenosis, site unspecified: Secondary | ICD-10-CM | POA: Diagnosis not present

## 2015-09-20 DIAGNOSIS — M5416 Radiculopathy, lumbar region: Secondary | ICD-10-CM | POA: Diagnosis not present

## 2015-09-20 DIAGNOSIS — Z6827 Body mass index (BMI) 27.0-27.9, adult: Secondary | ICD-10-CM | POA: Diagnosis not present

## 2015-09-20 DIAGNOSIS — M5126 Other intervertebral disc displacement, lumbar region: Secondary | ICD-10-CM | POA: Diagnosis not present

## 2015-09-22 DIAGNOSIS — M5126 Other intervertebral disc displacement, lumbar region: Secondary | ICD-10-CM | POA: Diagnosis not present

## 2015-09-22 DIAGNOSIS — M5416 Radiculopathy, lumbar region: Secondary | ICD-10-CM | POA: Diagnosis not present

## 2015-09-26 ENCOUNTER — Ambulatory Visit: Payer: Medicare Other | Admitting: Cardiovascular Disease

## 2015-09-27 ENCOUNTER — Ambulatory Visit: Payer: Medicare Other | Admitting: Family Medicine

## 2015-10-04 DIAGNOSIS — C61 Malignant neoplasm of prostate: Secondary | ICD-10-CM | POA: Diagnosis not present

## 2015-10-12 DIAGNOSIS — R351 Nocturia: Secondary | ICD-10-CM | POA: Diagnosis not present

## 2015-10-12 DIAGNOSIS — Z0000 Encounter for general adult medical examination without abnormal findings: Secondary | ICD-10-CM | POA: Diagnosis not present

## 2015-10-12 DIAGNOSIS — R972 Elevated prostate specific antigen [PSA]: Secondary | ICD-10-CM | POA: Diagnosis not present

## 2015-10-12 DIAGNOSIS — N4340 Spermatocele of epididymis, unspecified: Secondary | ICD-10-CM | POA: Diagnosis not present

## 2015-10-12 DIAGNOSIS — C61 Malignant neoplasm of prostate: Secondary | ICD-10-CM | POA: Diagnosis not present

## 2015-10-13 DIAGNOSIS — M5126 Other intervertebral disc displacement, lumbar region: Secondary | ICD-10-CM | POA: Diagnosis not present

## 2015-10-13 DIAGNOSIS — I10 Essential (primary) hypertension: Secondary | ICD-10-CM | POA: Diagnosis not present

## 2015-10-13 DIAGNOSIS — M5416 Radiculopathy, lumbar region: Secondary | ICD-10-CM | POA: Diagnosis not present

## 2015-11-09 ENCOUNTER — Encounter: Payer: Self-pay | Admitting: Cardiovascular Disease

## 2015-11-09 ENCOUNTER — Ambulatory Visit (INDEPENDENT_AMBULATORY_CARE_PROVIDER_SITE_OTHER): Payer: Medicare Other | Admitting: Cardiovascular Disease

## 2015-11-09 VITALS — BP 155/77 | HR 64 | Ht 72.0 in | Wt 210.0 lb

## 2015-11-09 DIAGNOSIS — R9431 Abnormal electrocardiogram [ECG] [EKG]: Secondary | ICD-10-CM

## 2015-11-09 DIAGNOSIS — I2510 Atherosclerotic heart disease of native coronary artery without angina pectoris: Secondary | ICD-10-CM | POA: Insufficient documentation

## 2015-11-09 DIAGNOSIS — I10 Essential (primary) hypertension: Secondary | ICD-10-CM | POA: Diagnosis not present

## 2015-11-09 DIAGNOSIS — E7800 Pure hypercholesterolemia, unspecified: Secondary | ICD-10-CM

## 2015-11-09 DIAGNOSIS — I471 Supraventricular tachycardia: Secondary | ICD-10-CM | POA: Diagnosis not present

## 2015-11-09 DIAGNOSIS — I499 Cardiac arrhythmia, unspecified: Secondary | ICD-10-CM | POA: Insufficient documentation

## 2015-11-09 NOTE — Progress Notes (Signed)
Patient ID: Derrick [name], male   DOB: 1932-04-16, 82 y.o.   MRN: [id number]     Cardiology Office Note    Date:  11/09/2015   ID:  Derrick [name], DOB [birth year], MRN [id number]  PCP:  [name], MD  Cardiologist:   Sanda Klein, MD   Chief Complaint  Patient presents with  . Follow-up    no chest pain, some shortness of breath, no swelling, no cramping, no dizziness or lightheadedness    History of Present Illness:  Derrick [name] is a 82 y.o. male hypertension and hypercholesterolemia and moderate chronic renal insufficiency status post nephrectomy for renal cell carcinoma who returns in follow-up for an abnormal electrocardiogram and nonobstructive coronary artery disease (angiography 2013 showed 40-50% mid RCA, 40% proximal LAD).  He brings a detailed list of daily blood pressure checks at home and his blood pressure seems to be optimally treated on the current regimen. Typically the systolic blood pressure is [id number] and the diastolic blood pressure is in the 60-mid 70s. Today in the office his blood pressure is elevated and it seems he has a component of situational hypertension.  His rhythm was very irregular in the office today. He states that he has noticed this when he checks his blood pressure with his automatic monitor, but he does not notice any palpitations. The electrocardiogram showed very frequent PACs and brief bursts of nonsustained atrial tachycardia, but he does not have atrial fibrillation.  He has mild exertional dyspnea, but is able to perform activities of daily living without any difficulty. Most of his complaints are limited to a variety of arthralgias, especially in his hips and in his lower cervical spine. He has received several steroid injections and is actually taking a low-dose of prednisone now. The steroid has really helped.  Past Medical History  Diagnosis Date  . Hypertension   . High cholesterol   . GERD (gastroesophageal reflux disease)   .  BPH (benign prostatic hyperplasia)   . Renal cell cancer ([place])     s/p nephrectomy in 2000  . Arthritis, rheumatoid [facility])     Past Surgical History  Procedure Laterality Date  . Nephrectomy  10/1998  . Left heart catheterization with coronary angiogram N/A 11/19/2011    Procedure: LEFT HEART CATHETERIZATION WITH CORONARY ANGIOGRAM;  Surgeon: [name], MD;  Location: [facility] CATH LAB;  Service: Cardiovascular;  Laterality: N/A;    Outpatient Prescriptions Prior to Visit  Medication Sig Dispense Refill  . amLODipine (NORVASC) 10 MG tablet Take 1 tablet (10 mg total) by mouth daily. 180 tablet 3  . aspirin EC 81 MG tablet Take 81 mg by mouth daily.    Marland Kitchen atorvastatin (LIPITOR) 80 MG tablet Take 80 mg by mouth daily.    . finasteride (PROSCAR) 5 MG tablet Take 5 mg by mouth daily.    . fish oil-omega-3 fatty acids 1000 MG capsule Take 2 g by mouth daily.     Marland Kitchen gabapentin (NEURONTIN) 300 MG capsule Take 300 mg by mouth 3 (three) times daily.    Marland Kitchen HYDROcodone-acetaminophen (NORCO/VICODIN) 5-325 MG tablet Take 1-2 tablets by mouth every 4 (four) hours as needed. 15 tablet 0  . losartan-hydrochlorothiazide (HYZAAR) 100-12.5 MG per tablet Take 0.5 tablets by mouth daily.    . Multiple Vitamins-Minerals (MULTIVITAMIN WITH MINERALS) tablet Take 1 tablet by mouth daily.    . pantoprazole (PROTONIX) 40 MG tablet Take 40 mg by mouth 2 (two) times daily.    . predniSONE (DELTASONE)  5 MG tablet Take 5 mg by mouth daily with breakfast.     . Capsaicin-Menthol-Methyl Sal (CAPSAICIN-METHYL SAL-MENTHOL) 0.025-1-12 % CREA Apply 1 application topically 4 (four) times daily as needed (leg pain). 56.6 g 0  . naproxen (NAPROSYN) 500 MG tablet Take 1 tablet (500 mg total) by mouth 2 (two) times daily with a meal. 10 tablet 0  . traMADol (ULTRAM) 50 MG tablet Take 50 mg by mouth 4 (four) times daily as needed for moderate pain.     Marland Kitchen VOLTAREN 1 % GEL Apply 2 g topically 4 (four) times daily. 100 g 0   No  facility-administered medications prior to visit.     Allergies:   Review of patient's allergies indicates no known allergies.   Social History   Social History  . Marital Status: Married    Spouse Name: N/A  . Number of Children: N/A  . Years of Education: N/A   Social History Main Topics  . Smoking status: Former Research scientist (life sciences)  . Smokeless tobacco: None  . Alcohol Use: No  . Drug Use: No  . Sexual Activity: Not Asked   Other Topics Concern  . None   Social History Narrative   Retired   Married   4 children           Family History:  The patient's family history includes Heart attack (age of onset: 85) in his father; Kidney failure in his sister.   ROS:   Please see the history of present illness.    ROS All other systems reviewed and are negative.   PHYSICAL EXAM:   VS:  BP 155/77 mmHg  Pulse 64  Ht 6' (1.829 m)  Wt 95.255 kg (210 lb)  BMI 28.47 kg/m2   GEN: Well nourished, well developed, in no acute distress HEENT: normal Neck: no JVD, carotid bruits, or masses Cardiac: Irregular rhythm ; grade 1/6 systolic ejection murmur in the aortic focus,  no diastolic murmurs, rubs, or gallops,no edema  Respiratory:  clear to auscultation bilaterally, normal work of breathing GI: soft, nontender, nondistended, + BS MS: no deformity or atrophy Skin: warm and dry, no rash Neuro:  Alert and Oriented x 3, Strength and sensation are intact Psych: euthymic mood, full affect  Wt Readings from Last 3 Encounters:  11/09/15 95.255 kg (210 lb)  09/05/15 97.07 kg (214 lb)  06/27/15 97.07 kg (214 lb)      Studies/Labs Reviewed:   EKG:  EKG is ordered today.  The ekg ordered today demsinus rhythm with very frequent PACs, atrial couplets and brief bursts of 3-4 beat nonsustained atrial tachycardia, left ventricular hypertrophy, prominent inverted T waves across the anterior precordium, QTC 445 ms. Comparable to the previous tracing  Recent Labs: 09/05/2015: BUN 16; Creatinine,  Ser 1.52*; Hemoglobin 14.9; Platelets 168; Potassium 4.1; Sodium 134*   Lipid Panel    Component Value Date/Time   CHOL 145 09/15/2012 0406   TRIG 95 09/15/2012 0406   HDL 62 09/15/2012 0406   CHOLHDL 2.3 09/15/2012 0406   VLDL 19 09/15/2012 0406   LDLCALC 64 09/15/2012 0406    ASSESSMENT:    1. Essential hypertension   2. High cholesterol   3. Abnormal ECG   4. Supraventricular tachycardia ([place])      PLAN:  In order of problems listed above:  1. HTN excellent blood pressure control when checked at home. Some component of "white coat" hypertension 2. HLP lipids will be rechecked by his primary care provider at his appointment  next month. Current lipid-lowering regimen has provided excellent control in the past 3. Abnormal ECG Suspect his repolarization abnormalities are related to left ventricular hypertrophy. He has hypertensive cardiomyopathy without overt congestive heart failure. 4. PACs/PAT frequent atrial ectopy and brief paroxysmal atrial tachycardia. Previous event monitor did not show evidence of atrial fibrillation. 5. CAD he had nonobstructive stenoses at the time of his cardiac catheterization about 3 years ago and had a normal nuclear perfusion study roughly 2 years ago. He does not describe angina pectoris.    Medication Adjustments/Labs and Tests Ordered: Current medicines are reviewed at length with the patient today.  Concerns regarding medicines are outlined above.  Medication changes, Labs and Tests ordered today are listed in the Patient Instructions below. Patient Instructions  Dr. [name] recommends that you schedule a follow-up appointment in: [place] Ridge[place]anda Klein, MD  11/09/2015 [address], [place], [place]  [address] Phone: [phone]; Fax: [phone]

## 2015-11-09 NOTE — Patient Instructions (Signed)
Dr. Croitoru recommends that you schedule a follow-up appointment in: ONE YEAR   

## 2015-11-09 NOTE — Assessment & Plan Note (Signed)
Nonobstructive lesions in LAD and RCA at Cath 2013

## 2015-11-17 DIAGNOSIS — I10 Essential (primary) hypertension: Secondary | ICD-10-CM | POA: Diagnosis not present

## 2015-11-17 DIAGNOSIS — Z0000 Encounter for general adult medical examination without abnormal findings: Secondary | ICD-10-CM | POA: Diagnosis not present

## 2015-11-21 ENCOUNTER — Encounter (HOSPITAL_COMMUNITY): Payer: Self-pay | Admitting: *Deleted

## 2015-11-21 ENCOUNTER — Emergency Department (HOSPITAL_COMMUNITY)
Admission: EM | Admit: 2015-11-21 | Discharge: 2015-11-21 | Disposition: A | Discharge status: Home / Self Care | Payer: Medicare Other | Attending: Emergency Medicine | Admitting: Emergency Medicine

## 2015-11-21 DIAGNOSIS — E7800 Pure hypercholesterolemia, unspecified: Secondary | ICD-10-CM | POA: Diagnosis not present

## 2015-11-21 DIAGNOSIS — Z87891 Personal history of nicotine dependence: Secondary | ICD-10-CM | POA: Diagnosis not present

## 2015-11-21 DIAGNOSIS — I10 Essential (primary) hypertension: Secondary | ICD-10-CM | POA: Insufficient documentation

## 2015-11-21 DIAGNOSIS — Z79899 Other long term (current) drug therapy: Secondary | ICD-10-CM | POA: Diagnosis not present

## 2015-11-21 DIAGNOSIS — Z9889 Other specified postprocedural states: Secondary | ICD-10-CM | POA: Insufficient documentation

## 2015-11-21 DIAGNOSIS — N400 Enlarged prostate without lower urinary tract symptoms: Secondary | ICD-10-CM | POA: Diagnosis not present

## 2015-11-21 DIAGNOSIS — Z7982 Long term (current) use of aspirin: Secondary | ICD-10-CM | POA: Diagnosis not present

## 2015-11-21 DIAGNOSIS — Z7952 Long term (current) use of systemic steroids: Secondary | ICD-10-CM | POA: Insufficient documentation

## 2015-11-21 DIAGNOSIS — Z85528 Personal history of other malignant neoplasm of kidney: Secondary | ICD-10-CM | POA: Diagnosis not present

## 2015-11-21 DIAGNOSIS — M069 Rheumatoid arthritis, unspecified: Secondary | ICD-10-CM | POA: Diagnosis not present

## 2015-11-21 DIAGNOSIS — M542 Cervicalgia: Secondary | ICD-10-CM | POA: Diagnosis not present

## 2015-11-21 DIAGNOSIS — Z8719 Personal history of other diseases of the digestive system: Secondary | ICD-10-CM | POA: Insufficient documentation

## 2015-11-21 MED ORDER — DEXAMETHASONE SODIUM PHOSPHATE 10 MG/ML IJ SOLN
10.0000 mg | Freq: Once | INTRAMUSCULAR | Status: AC
  Administered 2015-11-21: 10 mg via INTRAMUSCULAR
  Filled 2015-11-21: qty 1

## 2015-11-21 MED ORDER — HYDROCODONE-ACETAMINOPHEN 5-325 MG PO TABS: 1.0000 | ORAL_TABLET | ORAL | Status: DC | PRN

## 2015-11-21 NOTE — ED Notes (Signed)
Pt reports L neck pain d/t arthritis.  Was seen here last year in November and received a shot of decadron per his chart.  Pt denies injury at this time.  Pt is ambulatory without difficulty and with steady gait.

## 2015-11-21 NOTE — ED Provider Notes (Signed)
CSN: [id number]     Arrival date & time 11/21/15  1001 History   First MD Initiated Contact with Patient 11/21/15 1406     Chief Complaint  Patient presents with  . neck pain      (Consider location/radiation/quality/duration/timing/severity/associated sxs/prior Treatment) HPI    82 year old male with a past medical history of arthritis who presents emergency Department with chief complaint of acute left-sided neck pain. Patient states that he has had acute muscular pain on the left side. Worse when he turns his neck or raises his shoulder. He denies any new weakness, paresthesia. He was given Ultram by his physician, which is not working. He has had a shot of Decadron previously which has been very helpful when he has had previous episodes like this. He denies fevers, headache, injury.  Past Medical History  Diagnosis Date  . Hypertension   . High cholesterol   . GERD (gastroesophageal reflux disease)   . BPH (benign prostatic hyperplasia)   . Renal cell cancer ([place])     s/p nephrectomy in 2000  . Arthritis, rheumatoid [facility])    Past Surgical History  Procedure Laterality Date  . Nephrectomy  10/1998  . Left heart catheterization with coronary angiogram N/A 11/19/2011    Procedure: LEFT HEART CATHETERIZATION WITH CORONARY ANGIOGRAM;  Surgeon: [name], MD;  Location: [facility] C[facility] LAB;  Service: Cardiovascular;  Laterality: N/A;   Family History  Problem Relation Age of Onset  . Heart attack Father 36  . Kidney failure Sister     Bright's disease    Social History  Substance Use Topics  . Smoking status: Former Research scientist (life sciences)  . Smokeless tobacco: None  . Alcohol Use: No    Review of Systems Ten systems reviewed and are negative for acute change, except as noted in the HPI.     Allergies  Review of patient's allergies indicates no known allergies.  Home Medications   Prior to Admission medications   Medication Sig Start Date End Date Taking? Authorizing Provider   amLODipine (NORVASC) 10 MG tablet Take 1 tablet (10 mg total) by mouth daily. 06/27/15  Yes Brittainy Erie Noe, PA-C  aspirin EC 81 MG tablet Take 81 mg by mouth daily.   Yes Historical Provider, MD  atorvastatin (LIPITOR) 80 MG tablet Take 80 mg by mouth daily.   Yes Historical Provider, MD  finasteride (PROSCAR) 5 MG tablet Take 5 mg by mouth daily.   Yes Historical Provider, MD  fish oil-omega-3 fatty acids 1000 MG capsule Take 2 g by mouth daily.    Yes Historical Provider, MD  losartan-hydrochlorothiazide (HYZAAR) 100-12.5 MG per tablet Take 0.5 tablets by mouth daily. 03/05/15  Yes Historical Provider, MD  mirtazapine (REMERON) 15 MG tablet Take 15 mg by mouth at bedtime as needed (sleep).  11/15/15  Yes Historical Provider, MD  predniSONE (DELTASONE) 5 MG tablet Take 5 mg by mouth daily with breakfast.    Yes Historical Provider, MD  traMADol (ULTRAM) 50 MG tablet Take 50 mg by mouth every 6 (six) hours as needed for moderate pain.   Yes Historical Provider, MD  HYDROcodone-acetaminophen (NORCO/VICODIN) 5-325 MG tablet Take 1-2 tablets by mouth every 4 (four) hours as needed. 08/21/15   Malvin Johns, MD   BP 140/74 mmHg  Pulse 81  Temp(Src) 98.7 F (37.1 C) (Oral)  Resp 18  SpO2 95% Physical Exam Physical Exam  Nursing note and vitals reviewed. Constitutional: He appears well-developed and well-nourished. No distress.  HENT:  Head: Normocephalic  and atraumatic.  Eyes: Conjunctivae normal are normal. No scleral icterus.  Neck: Normal range of motion. Neck supple.  Cardiovascular: Normal rate, regular rhythm and normal heart sounds.   Pulmonary/Chest: Effort normal and breath sounds normal. No respiratory distress.  Abdominal: Soft. There is no tenderness.  Musculoskeletal: He exhibits no edema.  LEFT SIDED NECK PAIN. WORSE WITH MOVEMENT. TTP. ROM LIMITED WITH R ROTATION AND LEFT LATERAL FLEXION. NORMAL STRENGTH AND SENSATION OF BL UPPER EXTREMITIES. Neurological: He is alert.   Skin: Skin is warm and dry. He is not diaphoretic.  Psychiatric: His behavior is normal.    ED Course  Procedures (including critical care time) Labs Review Labs Reviewed - No data to display  Imaging Review No results found. I have personally reviewed and evaluated these images and lab results as part of my medical decision-making.   EKG Interpretation None      MDM   Final diagnoses:  Neck pain, musculoskeletal   BP 140/74 mmHg  Pulse 81  Temp(Src) 98.7 F (37.1 C) (Oral)  Resp 18  SpO2 95% PATIENT WITH MUSCULOSKELETAL NECK PAIN. NO neurologic deficits, no chest pain or shortness of breath. I doubt emergent cause such as carotid artery, vertebral artery dissection, or acute coronary syndrome. Patient given IM Decadron, discharged with Norco, follow up with PCP. Patient appears safe for discharge and agrees with plan of care.    Margarita Mail, PA-C 11/21/15 [place], MD 11/21/15 1726

## 2015-11-21 NOTE — Discharge Instructions (Signed)
Acute Torticollis °Torticollis is a condition in which the muscles of the neck tighten (contract) abnormally, causing the neck to twist and the head to move into an unnatural position. Torticollis that develops suddenly is called acute torticollis. If torticollis becomes chronic and is left untreated, the face and neck can become deformed. °CAUSES °This condition may be caused by: °· Sleeping in an awkward position (common). °· Extending or twisting the neck muscles beyond their normal position. °· Infection. °In some cases, the cause may not be known. °SYMPTOMS °Symptoms of this condition include: °· An unnatural position of the head. °· Neck pain. °· A limited ability to move the neck. °· Twisting of the neck to one side. °DIAGNOSIS °This condition is diagnosed with a physical exam. You may also have imaging tests, such as an X-ray, CT scan, or MRI. °TREATMENT °Treatment for this condition involves trying to relax the neck muscles. It may include: °· Medicines or shots. °· Physical therapy. °· Surgery. This may be done in severe cases. °HOME CARE INSTRUCTIONS °· Take medicines only as directed by your health care provider. °· Do stretching exercises and massage your neck as directed by your health care provider. °· Keep all follow-up visits as directed by your health care provider. This is important. °SEEK MEDICAL CARE IF: °· You develop a fever. °SEEK IMMEDIATE MEDICAL CARE IF: °· You develop difficulty breathing. °· You develop noisy breathing (stridor). °· You start drooling. °· You have trouble swallowing or have pain with swallowing. °· You develop numbness or weakness in your hands or feet. °· You have changes in your speech, understanding, or vision. °· Your pain gets worse. °  °This information is not intended to replace advice given to you by your health care provider. Make sure you discuss any questions you have with your health care provider. °  °Document Released: 09/21/2000 Document Revised:  02/08/2015 Document Reviewed: 09/20/2014 °Elsevier Interactive Patient Education ©2016 Elsevier Inc. ° °

## 2015-11-24 DIAGNOSIS — C61 Malignant neoplasm of prostate: Secondary | ICD-10-CM | POA: Diagnosis not present

## 2015-11-24 DIAGNOSIS — M0579 Rheumatoid arthritis with rheumatoid factor of multiple sites without organ or systems involvement: Secondary | ICD-10-CM | POA: Diagnosis not present

## 2015-11-24 DIAGNOSIS — N183 Chronic kidney disease, stage 3 (moderate): Secondary | ICD-10-CM | POA: Diagnosis not present

## 2015-11-24 DIAGNOSIS — L570 Actinic keratosis: Secondary | ICD-10-CM | POA: Diagnosis not present

## 2015-11-24 DIAGNOSIS — E7800 Pure hypercholesterolemia, unspecified: Secondary | ICD-10-CM | POA: Diagnosis not present

## 2015-11-26 ENCOUNTER — Encounter (HOSPITAL_COMMUNITY): Payer: Self-pay | Admitting: Emergency Medicine

## 2015-11-26 ENCOUNTER — Emergency Department (HOSPITAL_COMMUNITY)
Admission: EM | Admit: 2015-11-26 | Discharge: 2015-11-26 | Disposition: A | Discharge status: Home / Self Care | Payer: Medicare Other | Attending: Emergency Medicine | Admitting: Emergency Medicine

## 2015-11-26 DIAGNOSIS — Z85528 Personal history of other malignant neoplasm of kidney: Secondary | ICD-10-CM | POA: Diagnosis not present

## 2015-11-26 DIAGNOSIS — M069 Rheumatoid arthritis, unspecified: Secondary | ICD-10-CM | POA: Insufficient documentation

## 2015-11-26 DIAGNOSIS — M542 Cervicalgia: Secondary | ICD-10-CM

## 2015-11-26 DIAGNOSIS — N400 Enlarged prostate without lower urinary tract symptoms: Secondary | ICD-10-CM | POA: Insufficient documentation

## 2015-11-26 DIAGNOSIS — Z8719 Personal history of other diseases of the digestive system: Secondary | ICD-10-CM | POA: Diagnosis not present

## 2015-11-26 DIAGNOSIS — Z79899 Other long term (current) drug therapy: Secondary | ICD-10-CM | POA: Diagnosis not present

## 2015-11-26 DIAGNOSIS — I10 Essential (primary) hypertension: Secondary | ICD-10-CM | POA: Diagnosis not present

## 2015-11-26 DIAGNOSIS — Z87891 Personal history of nicotine dependence: Secondary | ICD-10-CM | POA: Insufficient documentation

## 2015-11-26 DIAGNOSIS — E7800 Pure hypercholesterolemia, unspecified: Secondary | ICD-10-CM | POA: Insufficient documentation

## 2015-11-26 DIAGNOSIS — M47812 Spondylosis without myelopathy or radiculopathy, cervical region: Secondary | ICD-10-CM

## 2015-11-26 DIAGNOSIS — M5090 Cervical disc disorder, unspecified, unspecified cervical region: Secondary | ICD-10-CM | POA: Insufficient documentation

## 2015-11-26 DIAGNOSIS — M5030 Other cervical disc degeneration, unspecified cervical region: Secondary | ICD-10-CM | POA: Diagnosis not present

## 2015-11-26 DIAGNOSIS — Z7952 Long term (current) use of systemic steroids: Secondary | ICD-10-CM | POA: Insufficient documentation

## 2015-11-26 DIAGNOSIS — Z7982 Long term (current) use of aspirin: Secondary | ICD-10-CM | POA: Insufficient documentation

## 2015-11-26 MED ORDER — DEXAMETHASONE SODIUM PHOSPHATE 10 MG/ML IJ SOLN
10.0000 mg | Freq: Once | INTRAMUSCULAR | Status: AC
  Administered 2015-11-26: 10 mg via INTRAMUSCULAR
  Filled 2015-11-26: qty 1

## 2015-11-26 MED ORDER — DIAZEPAM 5 MG PO TABS: 5.0000 mg | ORAL_TABLET | Freq: Three times a day (TID) | ORAL | Status: DC | PRN

## 2015-11-26 MED ORDER — HYDROCODONE-ACETAMINOPHEN 5-325 MG PO TABS: 1.0000 | ORAL_TABLET | ORAL | Status: DC | PRN

## 2015-11-26 NOTE — Discharge Instructions (Signed)
Use heat and sore area 3 or 4 times a day. Call Dr. Maryjean Ka for a follow-up appointment to be seen as soon as possible.        Osteoarthritis Osteoarthritis is a disease that causes soreness and inflammation of a joint. It occurs when the cartilage at the affected joint wears down. Cartilage acts as a cushion, covering the ends of bones where they meet to form a joint. Osteoarthritis is the most common form of arthritis. It often occurs in older people. The joints affected most often by this condition include those in the:  Ends of the fingers.  Thumbs.  Neck.  Lower back.  Knees.  Hips. CAUSES  Over time, the cartilage that covers the ends of bones begins to wear away. This causes bone to rub on bone, producing pain and stiffness in the affected joints.  RISK FACTORS Certain factors can increase your chances of having osteoarthritis, including:  Older age.  Excessive body weight.  Overuse of joints.  Previous joint injury. SIGNS AND SYMPTOMS   Pain, swelling, and stiffness in the joint.  Over time, the joint may lose its normal shape.  Small deposits of bone (osteophytes) may grow on the edges of the joint.  Bits of bone or cartilage can break off and float inside the joint space. This may cause more pain and damage. DIAGNOSIS  Your health care provider will do a physical exam and ask about your symptoms. Various tests may be ordered, such as:  X-rays of the affected joint.  Blood tests to rule out other types of arthritis. Additional tests may be used to diagnose your condition. TREATMENT  Goals of treatment are to control pain and improve joint function. Treatment plans may include:  A prescribed exercise program that allows for rest and joint relief.  A weight control plan.  Pain relief techniques, such as:  Properly applied heat and cold.  Electric pulses delivered to nerve endings under the skin (transcutaneous electrical nerve stimulation  [TENS]).  Massage.  Certain nutritional supplements.  Medicines to control pain, such as:  Acetaminophen.  Nonsteroidal anti-inflammatory drugs (NSAIDs), such as naproxen.  Narcotic or central-acting agents, such as tramadol.  Corticosteroids. These can be given orally or as an injection.  Surgery to reposition the bones and relieve pain (osteotomy) or to remove loose pieces of bone and cartilage. Joint replacement may be needed in advanced states of osteoarthritis. HOME CARE INSTRUCTIONS   Take medicines only as directed by your health care provider.  Maintain a healthy weight. Follow your health care provider's instructions for weight control. This may include dietary instructions.  Exercise as directed. Your health care provider can recommend specific types of exercise. These may include:  Strengthening exercises. These are done to strengthen the muscles that support joints affected by arthritis. They can be performed with weights or with exercise bands to add resistance.  Aerobic activities. These are exercises, such as brisk walking or low-impact aerobics, that get your heart pumping.  Range-of-motion activities. These keep your joints limber.  Balance and agility exercises. These help you maintain daily living skills.  Rest your affected joints as directed by your health care provider.  Keep all follow-up visits as directed by your health care provider. SEEK MEDICAL CARE IF:   Your skin turns red.  You develop a rash in addition to your joint pain.  You have worsening joint pain.  You have a fever along with joint or muscle aches. SEEK IMMEDIATE MEDICAL CARE IF:  You  have a significant loss of weight or appetite.  You have night sweats. [place] of Arthritis and Musculoskeletal and Skin Diseases: www.niams.SouthExposed.es  [employer] on Aging: http://kim-miller.com/  American College of Rheumatology: www.rheumatology.org   This  information is not intended to replace advice given to you by your health care provider. Make sure you discuss any questions you have with your health care provider.   Document Released: 09/24/2005 Document Revised: 10/15/2014 Document Reviewed: 06/01/2013 Elsevier Interactive Patient Education [employer].

## 2015-11-26 NOTE — ED Provider Notes (Signed)
CSN: [id number]     Arrival date & time 11/26/15  0720 History   First MD Initiated Contact with Patient 11/26/15 [phone]     Chief Complaint  Patient presents with  . Neck Pain     (Consider location/radiation/quality/duration/timing/severity/associated sxs/prior Treatment) HPI   Derrick [name] is a 82 y.o. male presents for evaluation of recurrent left neck pain radiating to the left trapezius region. Symptoms gradual onset over several days' time, and similar to prior pain multiple times over the last several months. He's been treated by several doctors including emergency medicine. His medicine and PCP for the same pain. He takes chronic prednisone 5 mg each day. He denies chest pain, shortness of breath, headache, nausea, vomiting, weakness or dizziness. He requests "a shot". He was in the ED, 5 days ago for the same problem and received Decadron and Norco. He did not change his steroid dose at that time. He has previously had spinal injections, for known severe degenerative joint disease of the lumbar spine. There are no other known modifying factors.   Past Medical History  Diagnosis Date  . Hypertension   . High cholesterol   . GERD (gastroesophageal reflux disease)   . BPH (benign prostatic hyperplasia)   . Renal cell cancer ([place])     s/p nephrectomy in 2000  . Arthritis, rheumatoid [facility])    Past Surgical History  Procedure Laterality Date  . Nephrectomy  10/1998  . Left heart catheterization with coronary angiogram N/A 11/19/2011    Procedure: LEFT HEART CATHETERIZATION WITH CORONARY ANGIOGRAM;  Surgeon: [name], MD;  Location: [facility] CATH LAB;  Service: Cardiovascular;  Laterality: N/A;   Family History  Problem Relation Age of Onset  . Heart attack Father 40  . Kidney failure Sister     Bright's disease    Social History  Substance Use Topics  . Smoking status: Former Research scientist (life sciences)  . Smokeless tobacco: None  . Alcohol Use: No    Review of Systems  All other systems  reviewed and are negative.     Allergies  Review of patient's allergies indicates no known allergies.  Home Medications   Prior to Admission medications   Medication Sig Start Date End Date Taking? Authorizing Provider  amLODipine (NORVASC) 10 MG tablet Take 1 tablet (10 mg total) by mouth daily. 06/27/15   Brittainy Erie Noe, PA-C  aspirin EC 81 MG tablet Take 81 mg by mouth daily.    Historical Provider, MD  atorvastatin (LIPITOR) 80 MG tablet Take 80 mg by mouth daily.    Historical Provider, MD  diazepam (VALIUM) 5 MG tablet Take 1 tablet (5 mg total) by mouth every 8 (eight) hours as needed for muscle spasms. 11/26/15   Daleen Bo, MD  finasteride (PROSCAR) 5 MG tablet Take 5 mg by mouth daily.    Historical Provider, MD  fish oil-omega-3 fatty acids 1000 MG capsule Take 2 g by mouth daily.     Historical Provider, MD  HYDROcodone-acetaminophen (NORCO) 5-325 MG tablet Take 1 tablet by mouth every 4 (four) hours as needed. 11/26/15   Daleen Bo, MD  losartan-hydrochlorothiazide (HYZAAR) 100-12.5 MG per tablet Take 0.5 tablets by mouth daily. 03/05/15   Historical Provider, MD  mirtazapine (REMERON) 15 MG tablet Take 15 mg by mouth at bedtime as needed (sleep).  11/15/15   Historical Provider, MD  predniSONE (DELTASONE) 5 MG tablet Take 5 mg by mouth daily with breakfast.     Historical Provider, MD  traMADol Veatrice Bourbon)  50 MG tablet Take 50 mg by mouth every 6 (six) hours as needed for moderate pain.    Historical Provider, MD   BP 160/92 mmHg  Pulse 82  Temp(Src) 97.8 F (36.6 C) (Oral)  Resp 18  SpO2 97% Physical Exam  Constitutional: He is oriented to person, place, and time. He appears well-developed and well-nourished.  HENT:  Head: Normocephalic and atraumatic.  Right Ear: External ear normal.  Left Ear: External ear normal.  Eyes: Conjunctivae and EOM are normal. Pupils are equal, round, and reactive to light.  Neck: Normal range of motion and phonation normal. Neck  supple.  Cardiovascular: Normal rate.   Pulmonary/Chest: Effort normal. He exhibits no bony tenderness.  Musculoskeletal: Normal range of motion.  Tender left lateral neck and trapezius. Normal range of motion, arms and legs bilaterally.  Neurological: He is alert and oriented to person, place, and time. No cranial nerve deficit or sensory deficit. He exhibits normal muscle tone. Coordination normal.  Skin: Skin is warm, dry and intact.  Psychiatric: He has a normal mood and affect. His behavior is normal. Judgment and thought content normal.  Nursing note and vitals reviewed.   ED Course  Procedures (including critical care time)  Medications  dexamethasone (DECADRON) injection 10 mg (not administered)    Patient Vitals for the past 24 hrs:  BP Temp Temp src Pulse Resp SpO2  11/26/15 0726 160/92 mmHg 97.8 F (36.6 C) Oral 82 18 97 %    7:43 AM Reevaluation with update and discussion. After initial assessment and treatment, an updated evaluation reveals no change in clinical status. Findings discussed with patient and wife, all questions answered. [place] Review Labs Reviewed - No data to display  Imaging Review No results found. I have personally reviewed and evaluated these images and lab results as part of my medical decision-making.   EKG Interpretation None      MDM   Final diagnoses:  Neck pain  Degenerative joint disease of cervical spine    Recurrent neck pain, likely related to degenerative joint disease of cervical spine. Doubt  spinal myelopathy, discitis or fracture at this time.  Nursing Notes Reviewed/ Care Coordinated Applicable Imaging Reviewed Interpretation of Laboratory Data incorporated into ED treatment  The patient appears reasonably screened and/or stabilized for discharge and I doubt any other medical condition or other [facility] requiring further screening, evaluation, or treatment in the ED at this time prior to  discharge.  Plan: Home Medications- increase prednisone to 20 mg each day, Valium for muscle spasm, Norco for pain; Home Treatments- heat and rest; return here if the recommended treatment, does not improve the symptoms; Recommended follow up- physical medicine follow-up asap, PCP when necessary     Daleen Bo, MD 11/26/15 [phone]

## 2015-11-26 NOTE — ED Notes (Signed)
Awake. Verbally responsive. A/O x4. Resp even and unlabored. No audible adventitious breath sounds noted. ABC's intact.  

## 2015-11-26 NOTE — ED Notes (Signed)
Pt reports acute chronic onset of left neck pain; seen for same relief with decadron injection.

## 2015-11-30 DIAGNOSIS — M542 Cervicalgia: Secondary | ICD-10-CM | POA: Diagnosis not present

## 2015-11-30 DIAGNOSIS — M5126 Other intervertebral disc displacement, lumbar region: Secondary | ICD-10-CM | POA: Diagnosis not present

## 2015-12-07 DIAGNOSIS — C61 Malignant neoplasm of prostate: Secondary | ICD-10-CM | POA: Diagnosis not present

## 2015-12-09 DIAGNOSIS — M47812 Spondylosis without myelopathy or radiculopathy, cervical region: Secondary | ICD-10-CM | POA: Diagnosis not present

## 2015-12-09 DIAGNOSIS — M542 Cervicalgia: Secondary | ICD-10-CM | POA: Diagnosis not present

## 2015-12-15 DIAGNOSIS — M5126 Other intervertebral disc displacement, lumbar region: Secondary | ICD-10-CM | POA: Diagnosis not present

## 2015-12-15 DIAGNOSIS — M47812 Spondylosis without myelopathy or radiculopathy, cervical region: Secondary | ICD-10-CM | POA: Diagnosis not present

## 2015-12-15 DIAGNOSIS — M542 Cervicalgia: Secondary | ICD-10-CM | POA: Diagnosis not present

## 2015-12-15 DIAGNOSIS — M5416 Radiculopathy, lumbar region: Secondary | ICD-10-CM | POA: Diagnosis not present

## 2016-02-07 DIAGNOSIS — R918 Other nonspecific abnormal finding of lung field: Secondary | ICD-10-CM | POA: Diagnosis not present

## 2016-02-07 DIAGNOSIS — H9192 Unspecified hearing loss, left ear: Secondary | ICD-10-CM | POA: Diagnosis not present

## 2016-02-07 DIAGNOSIS — R0609 Other forms of dyspnea: Secondary | ICD-10-CM | POA: Diagnosis not present

## 2016-02-07 DIAGNOSIS — I2510 Atherosclerotic heart disease of native coronary artery without angina pectoris: Secondary | ICD-10-CM | POA: Diagnosis not present

## 2016-02-07 DIAGNOSIS — Z8679 Personal history of other diseases of the circulatory system: Secondary | ICD-10-CM | POA: Diagnosis not present

## 2016-02-08 ENCOUNTER — Other Ambulatory Visit (HOSPITAL_COMMUNITY): Payer: Self-pay | Admitting: Internal Medicine

## 2016-02-08 DIAGNOSIS — R0609 Other forms of dyspnea: Principal | ICD-10-CM

## 2016-02-22 ENCOUNTER — Other Ambulatory Visit (HOSPITAL_COMMUNITY): Payer: Medicare Other

## 2016-02-24 DIAGNOSIS — IMO0001 Reserved for inherently not codable concepts without codable children: Secondary | ICD-10-CM | POA: Insufficient documentation

## 2016-02-24 DIAGNOSIS — H9041 Sensorineural hearing loss, unilateral, right ear, with unrestricted hearing on the contralateral side: Secondary | ICD-10-CM | POA: Insufficient documentation

## 2016-02-24 DIAGNOSIS — H6123 Impacted cerumen, bilateral: Secondary | ICD-10-CM | POA: Insufficient documentation

## 2016-02-24 DIAGNOSIS — H903 Sensorineural hearing loss, bilateral: Secondary | ICD-10-CM | POA: Diagnosis not present

## 2016-02-24 DIAGNOSIS — H9193 Unspecified hearing loss, bilateral: Secondary | ICD-10-CM | POA: Insufficient documentation

## 2016-03-02 ENCOUNTER — Ambulatory Visit (INDEPENDENT_AMBULATORY_CARE_PROVIDER_SITE_OTHER): Payer: Medicare Other | Admitting: Cardiovascular Disease

## 2016-03-02 ENCOUNTER — Encounter: Payer: Self-pay | Admitting: Cardiovascular Disease

## 2016-03-02 VITALS — BP 110/70 | HR 79 | Ht 72.0 in | Wt 210.4 lb

## 2016-03-02 DIAGNOSIS — I10 Essential (primary) hypertension: Secondary | ICD-10-CM

## 2016-03-02 DIAGNOSIS — N183 Chronic kidney disease, stage 3 unspecified: Secondary | ICD-10-CM

## 2016-03-02 DIAGNOSIS — Z905 Acquired absence of kidney: Secondary | ICD-10-CM

## 2016-03-02 DIAGNOSIS — I2510 Atherosclerotic heart disease of native coronary artery without angina pectoris: Secondary | ICD-10-CM

## 2016-03-02 DIAGNOSIS — E7800 Pure hypercholesterolemia, unspecified: Secondary | ICD-10-CM

## 2016-03-02 DIAGNOSIS — R0609 Other forms of dyspnea: Secondary | ICD-10-CM | POA: Diagnosis not present

## 2016-03-02 NOTE — Progress Notes (Signed)
Patient ID: Derrick [name], male   DOB: 28-Feb-1932, 82 y.o.   MRN: [id number] Patient ID: Derrick [name], male   DOB: 11-25-1931, 82 y.o.   MRN: [id number]     Cardiology Office Note    Date:  03/03/2016   ID:  Derrick [name], DOB 19-Jun-1932, MRN [id number]  PCP:  [name], MD  Cardiologist:   Sanda Klein, MD   Chief Complaint  Patient presents with  . Follow-up    per Lyda Jester, PA-C  pt c/o SOB on exertion; occasional dizziness when he gets up too fast; no other Sx.    History of Present Illness:  Derrick [name] is a 82 y.o. male hypertension and hypercholesterolemia and moderate chronic renal insufficiency status post nephrectomy for renal cell carcinoma who returns in follow-up for Worsening exertional dyspnea.   He has an abnormal electrocardiogram and nonobstructive coronary artery disease (angiography 2013 showed 40-50% mid RCA, 40% proximal LAD). These on EKG making his rhythm a very irregular on clinical exam. We have not documented atrial fibrillation.  He has worsening exertional dyspnea, even when compared to his last visit in February. He has secure complaints related to arthralgia. He has received several steroid injections and is still taking a low-dose of prednisone now. His memory is deteriorating.   He stopped smoking about 35 years ago. It is unclear whether he has COPD.  Past Medical History  Diagnosis Date  . Hypertension   . High cholesterol   . GERD (gastroesophageal reflux disease)   . BPH (benign prostatic hyperplasia)   . Renal cell cancer ([place])     s/p nephrectomy in 2000  . Arthritis, rheumatoid [facility])     Past Surgical History  Procedure Laterality Date  . Nephrectomy  10/1998  . Left heart catheterization with coronary angiogram N/A 11/19/2011    Procedure: LEFT HEART CATHETERIZATION WITH CORONARY ANGIOGRAM;  Surgeon: [name], MD;  Location: [facility] CATH LAB;  Service: Cardiovascular;  Laterality: N/A;    Outpatient Prescriptions  Prior to Visit  Medication Sig Dispense Refill  . amLODipine (NORVASC) 10 MG tablet Take 1 tablet (10 mg total) by mouth daily. 180 tablet 3  . aspirin EC 81 MG tablet Take 81 mg by mouth daily.    Marland Kitchen atorvastatin (LIPITOR) 80 MG tablet Take 80 mg by mouth daily.    . diazepam (VALIUM) 5 MG tablet Take 1 tablet (5 mg total) by mouth every 8 (eight) hours as needed for muscle spasms. 30 tablet 0  . finasteride (PROSCAR) 5 MG tablet Take 5 mg by mouth daily.    . fish oil-omega-3 fatty acids 1000 MG capsule Take 2 g by mouth daily.     Marland Kitchen HYDROcodone-acetaminophen (NORCO) 5-325 MG tablet Take 1 tablet by mouth every 4 (four) hours as needed. 30 tablet 0  . losartan-hydrochlorothiazide (HYZAAR) 100-12.5 MG per tablet Take 0.5 tablets by mouth daily.    . mirtazapine (REMERON) 15 MG tablet Take 15 mg by mouth at bedtime as needed (sleep).     . predniSONE (DELTASONE) 5 MG tablet Take 5 mg by mouth daily with breakfast.     . traMADol (ULTRAM) 50 MG tablet Take 50 mg by mouth every 6 (six) hours as needed for moderate pain.     No facility-administered medications prior to visit.     Allergies:   Review of patient's allergies indicates no known allergies.   Social History   Social History  . Marital Status: Married  Spouse Name: N/A  . Number of Children: N/A  . Years of Education: N/A   Social History Main Topics  . Smoking status: Former Research scientist (life sciences)  . Smokeless tobacco: None  . Alcohol Use: No  . Drug Use: No  . Sexual Activity: Not Asked   Other Topics Concern  . None   Social History Narrative   Retired   Married   4 children           Family History:  The patient's family history includes Heart attack (age of onset: 80) in his father; Kidney failure in his sister.   ROS:   Please see the history of present illness.    ROS All other systems reviewed and are negative.   PHYSICAL EXAM:   VS:  BP 110/70 mmHg  Pulse 79  Ht 6' (1.829 m)  Wt 95.437 kg (210 lb 6.4 oz)   BMI 28.53 kg/m2   GEN: Well nourished, well developed, in no acute distress HEENT: normal Neck: no JVD, carotid bruits, or masses Cardiac: Irregular rhythm ; grade 1/6 systolic ejection murmur in the aortic focus,  no diastolic murmurs, rubs, or gallops,no edema  Respiratory:  clear to auscultation bilaterally, normal work of breathing GI: soft, nontender, nondistended, + BS MS: no deformity or atrophy Skin: warm and dry, no rash Neuro:  Alert and Oriented x 3, Strength and sensation are intact Psych: euthymic mood, full affect  Wt Readings from Last 3 Encounters:  03/02/16 95.437 kg (210 lb 6.4 oz)  11/09/15 95.255 kg (210 lb)  09/05/15 97.07 kg (214 lb)      Studies/Labs Reviewed:   EKG:  EKG is ordered today.  The ekg ordered today demsinus rhythm with very frequent PACs, atrial couplets and brief bursts of 3-4 beat nonsustained atrial tachycardia. There is a rather prominent R wave in lead V2 left ventricular hypertrophy, prominent inverted T waves across the anterior precordium, QTC 421 ms. Comparable to the previous tracing  Recent Labs: 09/05/2015: BUN 16; Creatinine, Ser 1.52*; Hemoglobin 14.9; Platelets 168; Potassium 4.1; Sodium 134*   Lipid Panel    Component Value Date/Time   CHOL 145 09/15/2012 0406   TRIG 95 09/15/2012 0406   HDL 62 09/15/2012 0406   CHOLHDL 2.3 09/15/2012 0406   VLDL 19 09/15/2012 0406   LDLCALC 64 09/15/2012 0406    ASSESSMENT:    1. Coronary artery disease involving native coronary artery of native heart without angina pectoris   2. Dyspnea on exertion   3. Essential hypertension   4. CKD (chronic kidney disease), stage III   5. High cholesterol   6. H/O unilateral nephrectomy      PLAN:  In order of problems listed above:  1. CAD he had nonobstructive stenoses at the time of his cardiac catheterization about 3 years ago and had a normal nuclear perfusion study roughly 2 years ago. He does not describe angina pectoris. He does have  worsening exertional dyspnea, conceivably this could be an angina equivalent. Suspect his repolarization abnormalities are related to left ventricular hypertrophy, but cannot exclude ischemia. There have been no dynamic changes compared to his last electrocardiogram. Most recent nuclear study was performed in January 2015 and showed no evidence of ischemia. Will repeat that same test if LVEF is normal on echo. However, if left ventricular systolic function is decreased or if there are clearcut regional wall motion abnormalities, with plan to proceed directly to coronary angiography.  2. Dyspnea: may have a cardiac or pulmonary etiology.  Will start with an echocardiogram, then proceed to either nuclear perfusion imaging or directly to cardiac cath, depending on LVEF. If cardiac workup is negative, recommend pulmonary function tests. 3. HTN excellent blood pressure control; previously had a component of "white coat" hypertension not a problem today 4. CKD: His moderate renal insufficiency and history of surgical single kidney may repeat angiography less appealing, but the risk is not prohibitive.  5. HLP Current lipid-lowering regimen has provided excellent control. Need to get his most recent lipid profile from the spring from Dr. Shelia Media. 6. S/p right nephrectomy for renal cell carcinoma.    Medication Adjustments/Labs and Tests Ordered: Current medicines are reviewed at length with the patient today.  Concerns regarding medicines are outlined above.  Medication changes, Labs and Tests ordered today are listed in the Patient Instructions below.  Your physician has requested that you have an echocardiogram. Echocardiography is a painless test that uses sound waves to create images of your heart. It provides your doctor with information about the size and shape of your heart and how well your heart's chambers and valves are working. This procedure takes approximately one hour. There are no restrictions for  this procedure.   Patient Instructions  Your physician recommends that you continue on your current medications as directed. Please refer to the Current Medication list given to you today.  Dr [name] recommends that you schedule a follow-up appointment in 3 months.  If you need a refill on your cardiac medications before your next appointment, please call your pharmacy.  Signed, Sanda Klein, MD  03/03/2016 11:10 AM    [address], [place], [place]  [address] Phone: [phone]; Fax: [phone]

## 2016-03-02 NOTE — Patient Instructions (Signed)
Your physician recommends that you continue on your current medications as directed. Please refer to the Current Medication list given to you today.  Dr [name] recommends that you schedule a follow-up appointment in 3 months.  If you need a refill on your cardiac medications before your next appointment, please call your pharmacy.

## 2016-03-03 DIAGNOSIS — R0609 Other forms of dyspnea: Secondary | ICD-10-CM

## 2016-03-03 DIAGNOSIS — Z905 Acquired absence of kidney: Secondary | ICD-10-CM | POA: Insufficient documentation

## 2016-03-06 ENCOUNTER — Other Ambulatory Visit: Payer: Self-pay

## 2016-03-06 ENCOUNTER — Ambulatory Visit (HOSPITAL_COMMUNITY): Payer: Medicare Other | Attending: Cardiovascular Disease

## 2016-03-06 DIAGNOSIS — R0609 Other forms of dyspnea: Secondary | ICD-10-CM | POA: Diagnosis not present

## 2016-03-06 DIAGNOSIS — N189 Chronic kidney disease, unspecified: Secondary | ICD-10-CM | POA: Diagnosis not present

## 2016-03-06 DIAGNOSIS — I1310 Hypertensive heart and chronic kidney disease without heart failure, with stage 1 through stage 4 chronic kidney disease, or unspecified chronic kidney disease: Secondary | ICD-10-CM | POA: Diagnosis not present

## 2016-03-06 DIAGNOSIS — Z87891 Personal history of nicotine dependence: Secondary | ICD-10-CM | POA: Diagnosis not present

## 2016-03-06 DIAGNOSIS — R0600 Dyspnea, unspecified: Secondary | ICD-10-CM | POA: Diagnosis present

## 2016-03-06 DIAGNOSIS — I2510 Atherosclerotic heart disease of native coronary artery without angina pectoris: Secondary | ICD-10-CM | POA: Diagnosis not present

## 2016-03-06 DIAGNOSIS — E7800 Pure hypercholesterolemia, unspecified: Secondary | ICD-10-CM | POA: Insufficient documentation

## 2016-03-12 ENCOUNTER — Telehealth: Payer: Self-pay | Admitting: Cardiovascular Disease

## 2016-03-12 NOTE — Telephone Encounter (Signed)
New message   Pt is calling for the results of the Echo and he said it has been 4 days and he would like a call from the rn

## 2016-03-12 NOTE — Telephone Encounter (Signed)
LEFT MESSAGE ON VOICEMAIL TO INFORMED PATIENT RESULTS ARE NOT AVAILABLE YET

## 2016-03-14 ENCOUNTER — Telehealth: Payer: Self-pay | Admitting: Cardiovascular Disease

## 2016-03-14 DIAGNOSIS — R0600 Dyspnea, unspecified: Secondary | ICD-10-CM

## 2016-03-14 DIAGNOSIS — I2510 Atherosclerotic heart disease of native coronary artery without angina pectoris: Secondary | ICD-10-CM

## 2016-03-14 MED ORDER — FUROSEMIDE 40 MG PO TABS: 40.0000 mg | ORAL_TABLET | Freq: Every day | ORAL | Status: DC

## 2016-03-14 MED ORDER — POTASSIUM CHLORIDE CRYS ER 20 MEQ PO TBCR: 20.0000 meq | EXTENDED_RELEASE_TABLET | Freq: Every day | ORAL | Status: DC

## 2016-03-14 NOTE — Telephone Encounter (Signed)
Pt is calling in about the results to the Echo that he had done on 5/30. Please f/u with him .

## 2016-03-14 NOTE — Telephone Encounter (Signed)
Follow Up   Pt called request a call back for ECHO results

## 2016-03-14 NOTE — Telephone Encounter (Signed)
Called patient with results. Patient verbalized understanding and agreed with plan.  Labs printed to be mailed to patient. New prescriptions sent to patient's preferred pharmacy. Stress test ordered.  Patient aware a scheduler will contact him to schedule stress test.

## 2016-03-14 NOTE — Telephone Encounter (Signed)
Sorry for the delay in answering, but the report was not routed to my and basket. The echo shows her heart pumping function is still completely normal. The heart muscle is awake and has impaired relaxation, a problem we were previously aware of. There is borderline evidence of excess fluid in the lung circulation. I think it's worth trying to use a low dose of diuretic to see if that improves his breathing. Start furosemide 40 mg once daily and add potassium chloride 20 mEq daily, check a basic metabolic panel in 2 weeks. I would also recommend that he have a [employer], as we discussed at his office appointment. At this point I don't think we need to go to her deck catheterization unless he has an abnormality on the Myoview.

## 2016-03-19 ENCOUNTER — Telehealth: Payer: Self-pay | Admitting: Cardiovascular Disease

## 2016-03-19 NOTE — Telephone Encounter (Signed)
Called the patient and left a message to call back and schedule stress test.

## 2016-03-20 ENCOUNTER — Telehealth: Payer: Self-pay | Admitting: Cardiovascular Disease

## 2016-03-20 NOTE — Telephone Encounter (Signed)
Returned call to patient. He states he was trying to call in to get a stress test scheduled and could not get thru. Patient then states he has an appointment for his stress test 6/15 @ 12:45pm but he thought he was to have the test @ the hospital. Explained that stress test can be done in our office as well. No further action needed.

## 2016-03-20 NOTE — Telephone Encounter (Signed)
New Message   Pt states he was requested to do a breathing test by Dr. [name] . Pt did not know the doctors name he was referred to, only the number. Pt says he's having a hard time getting an appt. Pt returning to speak with RN to know further instructions.

## 2016-03-21 ENCOUNTER — Telehealth (HOSPITAL_COMMUNITY): Payer: Self-pay

## 2016-03-21 NOTE — Telephone Encounter (Signed)
Encounter complete. 

## 2016-03-22 ENCOUNTER — Ambulatory Visit (HOSPITAL_COMMUNITY)
Admission: RE | Admit: 2016-03-22 | Discharge: 2016-03-22 | Disposition: A | Discharge status: Home / Self Care | Payer: Medicare Other | Source: Ambulatory Visit | Attending: Cardiovascular Disease | Admitting: Cardiovascular Disease

## 2016-03-22 DIAGNOSIS — Z8249 Family history of ischemic heart disease and other diseases of the circulatory system: Secondary | ICD-10-CM | POA: Insufficient documentation

## 2016-03-22 DIAGNOSIS — R42 Dizziness and giddiness: Secondary | ICD-10-CM | POA: Diagnosis not present

## 2016-03-22 DIAGNOSIS — Z6828 Body mass index (BMI) 28.0-28.9, adult: Secondary | ICD-10-CM | POA: Insufficient documentation

## 2016-03-22 DIAGNOSIS — E663 Overweight: Secondary | ICD-10-CM | POA: Insufficient documentation

## 2016-03-22 DIAGNOSIS — Z87891 Personal history of nicotine dependence: Secondary | ICD-10-CM | POA: Diagnosis not present

## 2016-03-22 DIAGNOSIS — R5383 Other fatigue: Secondary | ICD-10-CM | POA: Diagnosis not present

## 2016-03-22 DIAGNOSIS — R002 Palpitations: Secondary | ICD-10-CM | POA: Insufficient documentation

## 2016-03-22 DIAGNOSIS — R0600 Dyspnea, unspecified: Secondary | ICD-10-CM

## 2016-03-22 DIAGNOSIS — R0609 Other forms of dyspnea: Secondary | ICD-10-CM | POA: Insufficient documentation

## 2016-03-22 DIAGNOSIS — R0602 Shortness of breath: Secondary | ICD-10-CM | POA: Diagnosis not present

## 2016-03-22 DIAGNOSIS — I2510 Atherosclerotic heart disease of native coronary artery without angina pectoris: Secondary | ICD-10-CM | POA: Diagnosis not present

## 2016-03-22 DIAGNOSIS — I10 Essential (primary) hypertension: Secondary | ICD-10-CM | POA: Insufficient documentation

## 2016-03-22 LAB — MYOCARDIAL PERFUSION IMAGING
CHL CUP NUCLEAR SDS: 3
CHL CUP NUCLEAR SRS: 2
CSEPPHR: 105 {beats}/min
LV dias vol: 80 mL (ref 62–150)
LV sys vol: 32 mL
Rest HR: 75 {beats}/min
SSS: 5
TID: 1.09

## 2016-03-22 MED ORDER — REGADENOSON 0.4 MG/5ML IV SOLN
0.4000 mg | Freq: Once | INTRAVENOUS | Status: AC
  Administered 2016-03-22: 0.4 mg via INTRAVENOUS

## 2016-03-22 MED ORDER — TECHNETIUM TC 99M TETROFOSMIN IV KIT
31.5000 | PACK | Freq: Once | INTRAVENOUS | Status: AC | PRN
  Administered 2016-03-22: 32 via INTRAVENOUS
  Filled 2016-03-22: qty 32

## 2016-03-22 MED ORDER — TECHNETIUM TC 99M TETROFOSMIN IV KIT
10.8000 | PACK | Freq: Once | INTRAVENOUS | Status: AC | PRN
  Administered 2016-03-22: 11 via INTRAVENOUS
  Filled 2016-03-22: qty 11

## 2016-04-09 DIAGNOSIS — R0600 Dyspnea, unspecified: Secondary | ICD-10-CM | POA: Diagnosis not present

## 2016-04-09 DIAGNOSIS — I2510 Atherosclerotic heart disease of native coronary artery without angina pectoris: Secondary | ICD-10-CM | POA: Diagnosis not present

## 2016-04-10 LAB — BASIC METABOLIC PANEL
BUN: 18 mg/dL (ref 7–25)
CALCIUM: 9.5 mg/dL (ref 8.6–10.3)
CO2: 28 mmol/L (ref 20–31)
CREATININE: 1.68 mg/dL — AB (ref 0.70–1.11)
Chloride: 103 mmol/L (ref 98–110)
GLUCOSE: 100 mg/dL — AB (ref 65–99)
Potassium: 4.5 mmol/L (ref 3.5–5.3)
Sodium: 140 mmol/L (ref 135–146)

## 2016-04-11 ENCOUNTER — Telehealth: Payer: Self-pay

## 2016-04-11 DIAGNOSIS — R0602 Shortness of breath: Secondary | ICD-10-CM

## 2016-04-11 DIAGNOSIS — I2510 Atherosclerotic heart disease of native coronary artery without angina pectoris: Secondary | ICD-10-CM

## 2016-04-11 NOTE — Telephone Encounter (Signed)
-----   Message from Sanda Klein, MD sent at 04/10/2016 10:23 AM EDT ----- Renal function very slightly above previous baseline, probably no real change but would recheck in 1-2 months

## 2016-04-11 NOTE — Telephone Encounter (Signed)
Called patient with results. Patient verbalized understanding.  Labs to be ordered and mailed to patient.

## 2016-04-18 ENCOUNTER — Telehealth: Payer: Self-pay | Admitting: Cardiovascular Disease

## 2016-04-18 NOTE — Telephone Encounter (Signed)
Let's try furosemide 40 mg with KCl 20 mEq, but only 4 days a week - for example TTSS, skip MWF. Hopefully that will offer a good compromise between dyspnea and low BP.

## 2016-04-18 NOTE — Telephone Encounter (Signed)
New message     Patient calling for nuclear & echo test results- wants a call today.

## 2016-04-18 NOTE — Telephone Encounter (Signed)
Spoke with patient and he has been having shortness of breath Saw Dr [name] on 03/02/16 for this Was recommended after his Echo to start Furosemide 40 mg daily and K+20 meq daily Patient stated that he took these two medications for about 4 days and started feeling weak so he took his blood pressure which was 85/55. He stopped both of the medications He does monitor his blood pressure regularly and blood pressure runs around 140/75 Patient continues to have shortness of breath worse with exertion. Denies any swelling but does eat out a lot. Encouraged patient to watch his sodium intake Will forward to Dr [name] for review

## 2016-04-18 NOTE — Telephone Encounter (Signed)
Advised patient, verbalized understanding Call back if no better or issues with your blood pressure being low

## 2016-05-10 DIAGNOSIS — J441 Chronic obstructive pulmonary disease with (acute) exacerbation: Secondary | ICD-10-CM | POA: Diagnosis not present

## 2016-05-10 DIAGNOSIS — J45909 Unspecified asthma, uncomplicated: Secondary | ICD-10-CM | POA: Diagnosis not present

## 2016-05-23 DIAGNOSIS — E7800 Pure hypercholesterolemia, unspecified: Secondary | ICD-10-CM | POA: Diagnosis not present

## 2016-05-23 DIAGNOSIS — M810 Age-related osteoporosis without current pathological fracture: Secondary | ICD-10-CM | POA: Diagnosis not present

## 2016-05-23 DIAGNOSIS — N183 Chronic kidney disease, stage 3 (moderate): Secondary | ICD-10-CM | POA: Diagnosis not present

## 2016-05-23 DIAGNOSIS — D649 Anemia, unspecified: Secondary | ICD-10-CM | POA: Diagnosis not present

## 2016-05-23 DIAGNOSIS — R42 Dizziness and giddiness: Secondary | ICD-10-CM | POA: Diagnosis not present

## 2016-05-30 DIAGNOSIS — Z23 Encounter for immunization: Secondary | ICD-10-CM | POA: Diagnosis not present

## 2016-06-06 ENCOUNTER — Encounter (INDEPENDENT_AMBULATORY_CARE_PROVIDER_SITE_OTHER): Payer: Self-pay

## 2016-06-06 ENCOUNTER — Encounter: Payer: Self-pay | Admitting: Cardiovascular Disease

## 2016-06-06 ENCOUNTER — Ambulatory Visit (INDEPENDENT_AMBULATORY_CARE_PROVIDER_SITE_OTHER): Payer: Medicare Other | Admitting: Cardiovascular Disease

## 2016-06-06 VITALS — BP 110/80 | HR 89 | Ht 72.0 in | Wt 203.6 lb

## 2016-06-06 DIAGNOSIS — E7800 Pure hypercholesterolemia, unspecified: Secondary | ICD-10-CM

## 2016-06-06 DIAGNOSIS — I2510 Atherosclerotic heart disease of native coronary artery without angina pectoris: Secondary | ICD-10-CM

## 2016-06-06 DIAGNOSIS — N183 Chronic kidney disease, stage 3 unspecified: Secondary | ICD-10-CM

## 2016-06-06 DIAGNOSIS — R0609 Other forms of dyspnea: Secondary | ICD-10-CM | POA: Diagnosis not present

## 2016-06-06 DIAGNOSIS — I10 Essential (primary) hypertension: Secondary | ICD-10-CM

## 2016-06-06 NOTE — Progress Notes (Signed)
Patient ID: Derrick [name], male   DOB: 10-06-1932, 82 y.o.   MRN: [id number] Patient ID: Derrick [name], male   DOB: Feb 26, 1932, 82 y.o.   MRN: [id number]     Cardiology Office Note    Date:  06/07/2016   ID:  Derrick [name], DOB Jun 29, 1932, MRN [id number]  PCP:  [name], MD  Cardiologist:   Sanda Klein, MD   Chief Complaint  Patient presents with  . Follow-up    pt c/o chest pain and SOB    History of Present Illness:  Derrick [name] is a 82 y.o. male hypertension and hypercholesterolemia and moderate chronic renal insufficiency status post nephrectomy for renal cell carcinoma who returns in follow-up after undergoing a myocardial perfusion study for worsening exertional dyspnea.   Nuclear stress test was normal without perfusion abnormalities. The EF was 61%.  I had recommended treatment with furosemide for presumed diastolic dysfunction. After taking it for a few days he developed orthostatic dizziness had confirmed blood pressure in the 80s/50s and stopped taking the diuretic.  He has an abnormal electrocardiogram and nonobstructive coronary artery disease (angiography 2013 showed 40-50% mid RCA, 40% proximal LAD). Echo in May 2017 showed left ventricular ejection fraction of 60-65% with left ventricular hypertrophy and diastolic dysfunction. There were borderline findings for fluid overload. We performed a nuclear stress test in June 2017 that did not show any perfusion abnormalities and confirmed normal left ventricle systolic function.  He has received several steroid injections and is still taking a low-dose of prednisone now. His memory is deteriorating. He stopped smoking about 35 years ago. It is unclear whether he has COPD.  Past Medical History:  Diagnosis Date  . Arthritis, rheumatoid ([place])   . BPH (benign prostatic hyperplasia)   . GERD (gastroesophageal reflux disease)   . High cholesterol   . Hypertension   . Renal cell cancer [facility])    s/p nephrectomy in 2000      Past Surgical History:  Procedure Laterality Date  . LEFT HEART CATHETERIZATION WITH CORONARY ANGIOGRAM N/A 11/19/2011   Procedure: LEFT HEART CATHETERIZATION WITH CORONARY ANGIOGRAM;  Surgeon: [name], MD;  Location: [facility] CATH LAB;  Service: Cardiovascular;  Laterality: N/A;  . NEPHRECTOMY  10/1998    Outpatient Medications Prior to Visit  Medication Sig Dispense Refill  . aspirin EC 81 MG tablet Take 81 mg by mouth daily.    Marland Kitchen atorvastatin (LIPITOR) 80 MG tablet Take 80 mg by mouth daily.    . finasteride (PROSCAR) 5 MG tablet Take 5 mg by mouth daily.    . fish oil-omega-3 fatty acids 1000 MG capsule Take 2 g by mouth daily.     . furosemide (LASIX) 40 MG tablet Take 40 mg by mouth as directed. Take 1 tablet by mouth on Tue, Thu, Sat, and Sun. None on Mon, Wed, and Fri    . losartan-hydrochlorothiazide (HYZAAR) 100-12.5 MG per tablet Take 0.5 tablets by mouth daily.    . potassium chloride SA (K-DUR,KLOR-CON) 20 MEQ tablet Take 20 mEq by mouth as directed. Take 1 tablet by mouth on Tue, Thu, Sat, and Sun. None on Mon, Wed, and Fri    . predniSONE (DELTASONE) 5 MG tablet Take 5 mg by mouth daily with breakfast.     . traMADol (ULTRAM) 50 MG tablet Take 50 mg by mouth every 6 (six) hours as needed for moderate pain.    Marland Kitchen amLODipine (NORVASC) 10 MG tablet Take 1 tablet (10 mg total)  by mouth daily. (Patient taking differently: Take 5 mg by mouth daily. ) 180 tablet 3  . diazepam (VALIUM) 5 MG tablet Take 1 tablet (5 mg total) by mouth every 8 (eight) hours as needed for muscle spasms. (Patient not taking: Reported on 06/06/2016) 30 tablet 0  . HYDROcodone-acetaminophen (NORCO) 5-325 MG tablet Take 1 tablet by mouth every 4 (four) hours as needed. (Patient not taking: Reported on 06/06/2016) 30 tablet 0  . mirtazapine (REMERON) 15 MG tablet Take 15 mg by mouth at bedtime as needed (sleep).      No facility-administered medications prior to visit.      Allergies:   Review of  patient's allergies indicates no known allergies.   Social History   Social History  . Marital status: Married    Spouse name: N/A  . Number of children: N/A  . Years of education: N/A   Social History Main Topics  . Smoking status: Former Research scientist (life sciences)  . Smokeless tobacco: None  . Alcohol use No  . Drug use: No  . Sexual activity: Not Asked   Other Topics Concern  . None   Social History Narrative   Retired   Married   4 children           Family History:  The patient's family history includes Heart attack (age of onset: 54) in his father; Kidney failure in his sister.   ROS:   Please see the history of present illness.    ROS All other systems reviewed and are negative.   PHYSICAL EXAM:   VS:  BP 110/80 (BP Location: Right Arm, Patient Position: Sitting, Cuff Size: Normal)   Pulse 89   Ht 6' (1.829 m)   Wt 203 lb 9.6 oz (92.4 kg)   SpO2 97%   BMI 27.61 kg/m    GEN: Well nourished, well developed, in no acute distress  HEENT: normal  Neck: no JVD, carotid bruits, or masses Cardiac: Irregular rhythm ; grade 1/6 systolic ejection murmur in the aortic focus,  no diastolic murmurs, rubs, or gallops,no edema  Respiratory:  clear to auscultation bilaterally, normal work of breathing GI: soft, nontender, nondistended, + BS MS: no deformity or atrophy  Skin: warm and dry, no rash Neuro:  Alert and Oriented x 3, Strength and sensation are intact Psych: euthymic mood, full affect  Wt Readings from Last 3 Encounters:  06/06/16 203 lb 9.6 oz (92.4 kg)  03/22/16 210 lb (95.3 kg)  03/02/16 210 lb 6.4 oz (95.4 kg)      Studies/Labs Reviewed:   EKG:  EKG is ordered today.  The ekg ordered today Sinus rhythm with frequent PACs and a brief run of paroxysmal atrial tachycardia, not much change from his previous tracing  Recent Labs: 09/05/2015: Hemoglobin 14.9; Platelets 168 04/09/2016: BUN 18; Creat 1.68; Potassium 4.5; Sodium 140   Lipid Panel    Component Value  Date/Time   CHOL 145 09/15/2012 0406   TRIG 95 09/15/2012 0406   HDL 62 09/15/2012 0406   CHOLHDL 2.3 09/15/2012 0406   VLDL 19 09/15/2012 0406   LDLCALC 64 09/15/2012 0406    ASSESSMENT:    1. Coronary artery disease involving native coronary artery of native heart without angina pectoris   2. Dyspnea on exertion   3. Essential hypertension   4. CKD (chronic kidney disease), stage III   5. High cholesterol      PLAN:  In order of problems listed above:  1. CAD he had nonobstructive stenoses  at the time of his cardiac catheterization about 3 years ago and has a normal nuclear perfusion study roughly 2 years ago. He does not have angina pectoris . At this point there does not appear to be any compelling reason to proceed with invasive coronary angiography.  2. Dyspnea: Etiology uncertain. Pulmonary function testing performed roughly one year ago showed normal FVC and FEV1. There is some evidence of diastolic dysfunction and will try treatment with diuretics again, but will first stop the amlodipine since he had symptomatic hypotension and use a lower dose of intermittent loop diuretic  3. HTN excellent blood pressure control; stop amlodipine 4. CKD: S/p right nephrectomy for renal cell carcinoma. His moderate renal insufficiency and history of surgical single kidney make repeat angiography even less appealing, 5. HLP Current lipid-lowering regimen has provided excellent control.    Medication Adjustments/Labs and Tests Ordered: Current medicines are reviewed at length with the patient today.  Concerns regarding medicines are outlined above.  Medication changes, Labs and Tests ordered today are listed in the Patient Instructions below.  Your physician has requested that you have an echocardiogram. Echocardiography is a painless test that uses sound waves to create images of your heart. It provides your doctor with information about the size and shape of your heart and how well your  heart's chambers and valves are working. This procedure takes approximately one hour. There are no restrictions for this procedure.   Patient Instructions  Dr [name] has recommended making the following medication changes: 1. STOP Amlodipine 2. RESTART Furosemide (Lasix) 40 mg on Tuesdays, Thursdays, Saturdays, and Sundays  Dr C recommends that you schedule a follow-up appointment in 3 months.  If you need a refill on your cardiac medications before your next appointment, please call your pharmacy. Signed, Sanda Klein, MD  06/07/2016 4:10 PM    [facility] [place], [place],   [address] Phone: [phone]; Fax: [phone]

## 2016-06-06 NOTE — Patient Instructions (Signed)
Dr [name] has recommended making the following medication changes: 1. STOP Amlodipine 2. RESTART Furosemide (Lasix) 40 mg on Tuesdays, Thursdays, Saturdays, and Sundays  Dr C recommends that you schedule a follow-up appointment in 3 months.  If you need a refill on your cardiac medications before your next appointment, please call your pharmacy.

## 2016-06-13 ENCOUNTER — Telehealth: Payer: Self-pay | Admitting: Cardiovascular Disease

## 2016-06-13 DIAGNOSIS — C61 Malignant neoplasm of prostate: Secondary | ICD-10-CM | POA: Diagnosis not present

## 2016-06-13 NOTE — Telephone Encounter (Signed)
SPOKE TO PATIENT. INFORMATION GIVEN.  PATIENT STATES BREATHING HAS NOT MUCH BETTER.  PATIENT STATES HE HAS NOT BEEN WEIGHING DAILY.  RN INFORMED PATIENT TO WEIGH DAILY ,THE SAME TIME EACH DAY. IF WEIGHT GOES UP MORE THAN 3 LBS IN A NIGHT OR 5 LBS IN WEEK CONTACT OFFICE.  DISCONTINUE MEDICATION ON MED LIST .  PATIENT VERBALIZED UNDERSTANDING.

## 2016-06-13 NOTE — Telephone Encounter (Signed)
I understand. Please stop the furosemide. Also stop the potassium. Ask him if he feels that his breathing got any better? Aalso ask him if he has noticed any change in his weight, compared to when he started to furosemide.

## 2016-06-13 NOTE — Telephone Encounter (Signed)
Spoke to Derrick [name] states he is still feeling dizzy and blood pressure is low. Patient states he is taking furosemide TUES, THURS, SAT. ,SUN.Derrick [name] PATIENT he states stopped taking Amlodipine as prescribed.  Blood reading has been 121/64 ,129/75 ,119/70,127 /79.  patient did not give the day these reading where taken.  Today's Blood pressure was 85/51--patient states he did not take furosemide.  Patient aware will  defer to Dr [name] and contact patient back. Patient verbalized understanding.

## 2016-06-13 NOTE — Telephone Encounter (Signed)
Pt says his blood pressure is so low. This started when he started taking Furosemide and Potassium Chloride. Please call to advise.

## 2016-06-20 DIAGNOSIS — R972 Elevated prostate specific antigen [PSA]: Secondary | ICD-10-CM | POA: Diagnosis not present

## 2016-06-20 DIAGNOSIS — N401 Enlarged prostate with lower urinary tract symptoms: Secondary | ICD-10-CM | POA: Diagnosis not present

## 2016-06-20 DIAGNOSIS — C61 Malignant neoplasm of prostate: Secondary | ICD-10-CM | POA: Diagnosis not present

## 2016-06-25 DIAGNOSIS — I10 Essential (primary) hypertension: Secondary | ICD-10-CM | POA: Diagnosis not present

## 2016-06-25 DIAGNOSIS — I499 Cardiac arrhythmia, unspecified: Secondary | ICD-10-CM | POA: Diagnosis not present

## 2016-06-25 DIAGNOSIS — M5090 Cervical disc disorder, unspecified, unspecified cervical region: Secondary | ICD-10-CM | POA: Diagnosis not present

## 2016-06-25 DIAGNOSIS — I2510 Atherosclerotic heart disease of native coronary artery without angina pectoris: Secondary | ICD-10-CM | POA: Diagnosis not present

## 2016-07-11 DIAGNOSIS — I10 Essential (primary) hypertension: Secondary | ICD-10-CM | POA: Diagnosis not present

## 2016-07-11 DIAGNOSIS — Z85528 Personal history of other malignant neoplasm of kidney: Secondary | ICD-10-CM | POA: Diagnosis not present

## 2016-07-11 DIAGNOSIS — L820 Inflamed seborrheic keratosis: Secondary | ICD-10-CM | POA: Diagnosis not present

## 2016-08-01 ENCOUNTER — Telehealth: Payer: Self-pay | Admitting: Genetic Counselor

## 2016-08-01 ENCOUNTER — Encounter: Payer: Self-pay | Admitting: Genetic Counselor

## 2016-08-01 NOTE — Telephone Encounter (Signed)
Pt confirmed appt, completed intake, mailed pt letter, faxed referring provider appt date/time °

## 2016-08-20 DIAGNOSIS — D485 Neoplasm of uncertain behavior of skin: Secondary | ICD-10-CM | POA: Diagnosis not present

## 2016-08-20 DIAGNOSIS — K5900 Constipation, unspecified: Secondary | ICD-10-CM | POA: Diagnosis not present

## 2016-08-20 DIAGNOSIS — I10 Essential (primary) hypertension: Secondary | ICD-10-CM | POA: Diagnosis not present

## 2016-08-22 ENCOUNTER — Telehealth: Payer: Self-pay | Admitting: *Deleted

## 2016-08-22 NOTE — Telephone Encounter (Signed)
"  I received a call about an appointment with provider Jeanine Luz.  Calling to confirm I'll be there 09-06-2016."

## 2016-09-05 ENCOUNTER — Ambulatory Visit: Payer: Medicare Other | Admitting: Cardiovascular Disease

## 2016-09-05 ENCOUNTER — Encounter: Payer: Self-pay | Admitting: *Deleted

## 2016-09-05 ENCOUNTER — Encounter: Payer: Self-pay | Admitting: Genetic Counselor

## 2016-09-06 ENCOUNTER — Ambulatory Visit (HOSPITAL_BASED_OUTPATIENT_CLINIC_OR_DEPARTMENT_OTHER): Payer: Medicare Other | Admitting: Genetic Counselor

## 2016-09-06 ENCOUNTER — Other Ambulatory Visit: Payer: Medicare Other

## 2016-09-06 DIAGNOSIS — Z809 Family history of malignant neoplasm, unspecified: Secondary | ICD-10-CM

## 2016-09-06 DIAGNOSIS — Z806 Family history of leukemia: Secondary | ICD-10-CM | POA: Diagnosis not present

## 2016-09-06 DIAGNOSIS — Z85828 Personal history of other malignant neoplasm of skin: Secondary | ICD-10-CM | POA: Diagnosis not present

## 2016-09-06 DIAGNOSIS — Z85528 Personal history of other malignant neoplasm of kidney: Secondary | ICD-10-CM | POA: Diagnosis not present

## 2016-09-06 DIAGNOSIS — Z803 Family history of malignant neoplasm of breast: Secondary | ICD-10-CM

## 2016-09-06 DIAGNOSIS — Z315 Encounter for genetic counseling: Secondary | ICD-10-CM

## 2016-09-13 ENCOUNTER — Other Ambulatory Visit: Payer: Self-pay

## 2016-09-13 DIAGNOSIS — L821 Other seborrheic keratosis: Secondary | ICD-10-CM | POA: Diagnosis not present

## 2016-09-13 DIAGNOSIS — D229 Melanocytic nevi, unspecified: Secondary | ICD-10-CM | POA: Diagnosis not present

## 2016-09-13 DIAGNOSIS — D492 Neoplasm of unspecified behavior of bone, soft tissue, and skin: Secondary | ICD-10-CM | POA: Diagnosis not present

## 2016-09-14 DIAGNOSIS — M11241 Other chondrocalcinosis, right hand: Secondary | ICD-10-CM | POA: Diagnosis not present

## 2016-09-14 DIAGNOSIS — M0579 Rheumatoid arthritis with rheumatoid factor of multiple sites without organ or systems involvement: Secondary | ICD-10-CM | POA: Diagnosis not present

## 2016-09-14 DIAGNOSIS — M79646 Pain in unspecified finger(s): Secondary | ICD-10-CM | POA: Diagnosis not present

## 2016-09-14 DIAGNOSIS — M1990 Unspecified osteoarthritis, unspecified site: Secondary | ICD-10-CM | POA: Diagnosis not present

## 2016-09-14 DIAGNOSIS — M11242 Other chondrocalcinosis, left hand: Secondary | ICD-10-CM | POA: Diagnosis not present

## 2016-09-16 ENCOUNTER — Encounter: Payer: Self-pay | Admitting: Genetic Counselor

## 2016-09-16 NOTE — Progress Notes (Signed)
REFERRING PROVIDER: [name], MD [address] [place] [employer],  [address]  PRIMARY PROVIDER:  [name], MD  PRIMARY REASON FOR VISIT:  1. History of renal cell cancer   2. Family history of breast cancer in male   3. Family history of leukemia   4. Family history of cancer      HISTORY OF PRESENT ILLNESS:   Derrick [name], a 82 y.o. male, was seen for a [place] cancer genetics consultation at the request of Dr. Shelia Media due to a personal history of renal cell carcinoma and family history of cancer.  Derrick [name] presents to clinic today to discuss the possibility of a hereditary predisposition to cancer, genetic testing, and to further clarify his future cancer risks, as well as potential cancer risks for family members.   In 2000, at the age of 71, Derrick [name] was diagnosed with renal cell carcinoma of the right kidney. This was treated with right nephrectomy.  He also has a history of basal cell carcinoma, diagnosed approximately 10 years ago.  Additionally, he was at first thought to have prostate cancer, but this was later thought to be benign and he is getting surveillance every 6 months.   RISK FACTORS:  Colonoscopy: yes, most recent was in 2011; approx 4 adenomas were found on his last colonoscopy; a colonoscopy in 2003 found one hyperplastic rectal polyp; he reports that he has had less than 10 polyps total. Up to date with prostate exams:  yes, current surveillance every 6 months. Any excessive radiation exposure/other exposures in the past:  Reports some additional secondhand smoke exposure, but not much  Past Medical History:  Diagnosis Date  . Arthritis, rheumatoid ([place])   . BPH (benign prostatic hyperplasia)   . GERD (gastroesophageal reflux disease)   . High cholesterol   . Hypertension   . Renal cell cancer [facility])    s/p nephrectomy in 2000    Past Surgical History:  Procedure Laterality Date  . LEFT HEART CATHETERIZATION WITH CORONARY ANGIOGRAM  N/A 11/19/2011   Procedure: LEFT HEART CATHETERIZATION WITH CORONARY ANGIOGRAM;  Surgeon: [name], MD;  Location: [facility] CATH LAB;  Service: Cardiovascular;  Laterality: N/A;  . NEPHRECTOMY  10/1998    Social History   Social History  . Marital status: Married    Spouse name: N/A  . Number of children: N/A  . Years of education: N/A   Social History Main Topics  . Smoking status: Former Smoker    Packs/day: 1.00    Years: 29.00    Types: Cigarettes    Quit date: 10/08/1978  . Smokeless tobacco: Never Used     Comment: tried smokeless tobacco once but made him sick  . Alcohol use No     Comment: drank very little in his life - only socially   . Drug use: No  . Sexual activity: Not Asked   Other Topics Concern  . None   Social History Narrative   Retired   Married   4 children           FAMILY HISTORY:  We obtained a detailed, 4-generation family history.  Significant diagnoses are listed below: Family History  Problem Relation Age of Onset  . Heart attack Father 36  . Stroke Father   . Kidney failure Sister     Bright's disease   . Cancer Paternal Aunt     d. 37s; NOS cancer of leg  . Kidney disease Sister     d.  24; "Bright's disease" - possible kidney cancer?  . Cancer Sister     NOS cancer; d. 41s; "sick in the hospital"  . Heart attack Son 55  . Leukemia Son 19  . COPD Son   . Breast cancer Daughter 53    Derrick [name] has three sons and one daughter. His daughter passed away in a car accident at the age of 33.  She was diagnosed with breast cancer at 35.  Derrick [name] sons are all currently in their 33s.  His middle son was diagnosed with leukemia at 103.  Derrick [name] has five grandchildren.  He has five full sisters and two full brothers.  Three sisters have passed away--two in their 21s from and unspecified cancer or another unspecified cause and one at age 39 from "Bright's disease".  The other siblings are all living--currently in their 103s to [place].  Derrick [name]  reports no known history of cancer for any of his nieces or nephews.    Derrick [name] mother passed away at 61 from age-related causes.  She had three full sisters, all of whom passed away over the age of 7.  Derrick [name], however, has no further information for these aunts.  He also has no information for his maternal first cousins.  His maternal grandmother passed away before he was born, so he has limited information for her and also has limited information for his maternal grandfather.  He has no information for any maternal great aunts/uncles and great grandparents.  Derrick [name] father had a history of a stroke and he passed away from a heart attack at 52.  He had one full brother and one full sister.  His sister passed away from an unspecified type of cancer of her leg in her 49s.  She had three children, but Derrick [name] has no information for these cousins.  His uncle also passed away, but he has no information for him.  His paternal grandmother passed away around the age of 21 and she never had cancer.  He has limited information for his grandfather.  He has no information for any paternal great aunts/uncles and great grandparents.  Derrick [name] is unaware of any family history of genetic testing for hereditary cancer risks.  Patient's maternal and paternal ancestors are of Caucasian descent. There is no reported Ashkenazi Jewish ancestry. There is no known consanguinity.  GENETIC COUNSELING ASSESSMENT: Derrick [name] is a 82 y.o. male with a personal and family history of cancer. We, therefore, discussed and recommended the following at today's visit.   DISCUSSION: We reviewed the characteristics, features and inheritance patterns of hereditary cancer syndromes.  We discussed that approximately 5-10% of cancer is caused by a hereditary gene mutation.  For these hereditary cancer syndromes, we can typically pick up on certain "red flags" within someone's personal and/or family history of cancer that makes [place] more  suspicious.  Those red flags include: cancer diagnosis at an early age, family history of multiple of the same type (or similar types) of cancer and in multiple generations of a family, history of certain rare cancers, and individuals with multiple primary cancers, among others.  We discussed that Derrick [name] personal and family history seems to be reassuring and not highly consistent with a hereditary cancer syndrome.  He is likely at a low risk to harbor a gene mutation associated with such a condition.  However, we did discuss the uncertainty of his sister's diagnosis and subsequent death due to "Bright's disease" at  age 1.  This may have just been a kidney disease.  If it were an early-onset kidney cancer, then that would make [place] somewhat more wary of a hereditary renal cancer syndrome.  We also have some limited information about some of Derrick [name] aunts/uncles, cousins, and grandparents.  Most likely, there is still a low chance that we would find a genetic mutation.  Thus, we discussed the option for genetic testing for hereditary renal cancer syndromes in light of this limited information.  We also discussed genetic testing, including the appropriate family members to test, the process of testing, insurance coverage and turn-around-time for results. We discussed the implications of a negative, positive and/or variant of uncertain significant result.  If we choose to proceed with genetic testing, we would likely proceed with genetic testing of a Renal Cancer Panel through either [facility] or [facility].  We could ask either lab to perform a benefits investigation to determine how much Derrick [name]. Gater would pay for testing out-of-pocket after insurance coverage.  PLAN: Derrick [name]. Camelo did not wish to pursue genetic testing at today's visit. He will further consider genetic testing and contact us if he is interested in pursuing it.  We understand this decision, and remain available to coordinate  genetic testing at any time in the future. We; therefore, recommend Derrick [name]. Moring continue to follow the cancer screening guidelines given by his primary healthcare provider.  Lastly, we encouraged Derrick [name]. Gandee to remain in contact with cancer genetics annually so that we can continuously update the family history and inform him of any changes in cancer genetics and testing that may be of benefit for this family.   Derrick [name].  Waiters questions were answered to his satisfaction today. Our contact information was provided should additional questions or concerns arise. Thank you for the referral and allowing [place] to share in the care of your patient.   Jeanine Luz, MS, [facility] Certified Genetic Counselor [email or web address] Phone: [phone]  The patient was seen for a total of 45 minutes in face-to-face genetic counseling.  This patient was discussed with Drs. Magrinat, Lindi Adie and/or Burr Medico who agrees with the above.    _______________________________________________________________________ For Office Staff:  Number of people involved in session: 2 Was an Intern/ student involved with case: no

## 2016-10-16 DIAGNOSIS — Z8051 Family history of malignant neoplasm of kidney: Secondary | ICD-10-CM | POA: Diagnosis not present

## 2016-10-16 DIAGNOSIS — R110 Nausea: Secondary | ICD-10-CM | POA: Diagnosis not present

## 2016-10-16 DIAGNOSIS — K5900 Constipation, unspecified: Secondary | ICD-10-CM | POA: Diagnosis not present

## 2016-10-16 DIAGNOSIS — M0579 Rheumatoid arthritis with rheumatoid factor of multiple sites without organ or systems involvement: Secondary | ICD-10-CM | POA: Diagnosis not present

## 2016-10-30 DIAGNOSIS — M79646 Pain in unspecified finger(s): Secondary | ICD-10-CM | POA: Diagnosis not present

## 2016-10-30 DIAGNOSIS — M1990 Unspecified osteoarthritis, unspecified site: Secondary | ICD-10-CM | POA: Diagnosis not present

## 2016-10-30 DIAGNOSIS — M0579 Rheumatoid arthritis with rheumatoid factor of multiple sites without organ or systems involvement: Secondary | ICD-10-CM | POA: Diagnosis not present

## 2016-11-19 ENCOUNTER — Other Ambulatory Visit: Payer: Self-pay | Admitting: Internal Medicine

## 2016-11-19 DIAGNOSIS — R413 Other amnesia: Secondary | ICD-10-CM

## 2016-11-19 DIAGNOSIS — R55 Syncope and collapse: Secondary | ICD-10-CM | POA: Diagnosis not present

## 2016-11-19 DIAGNOSIS — R1010 Upper abdominal pain, unspecified: Secondary | ICD-10-CM | POA: Diagnosis not present

## 2016-11-21 ENCOUNTER — Encounter: Payer: Self-pay | Admitting: *Deleted

## 2016-11-21 DIAGNOSIS — R55 Syncope and collapse: Secondary | ICD-10-CM | POA: Insufficient documentation

## 2016-11-21 NOTE — Progress Notes (Signed)
Patient ID: Derrick [name], male   DOB: [birth year], 83 y.o.   MRN: [id number] Patient did not show up for 12:00 PM, appointment to have a 24 hour holter monitor applied.

## 2016-11-27 ENCOUNTER — Ambulatory Visit (INDEPENDENT_AMBULATORY_CARE_PROVIDER_SITE_OTHER): Payer: Medicare Other

## 2016-11-27 DIAGNOSIS — R55 Syncope and collapse: Secondary | ICD-10-CM | POA: Diagnosis not present

## 2016-11-28 ENCOUNTER — Ambulatory Visit
Admission: RE | Admit: 2016-11-28 | Discharge: 2016-11-28 | Disposition: A | Discharge status: Home / Self Care | Payer: Medicare Other | Source: Ambulatory Visit | Attending: Internal Medicine | Admitting: Internal Medicine

## 2016-11-28 DIAGNOSIS — R413 Other amnesia: Secondary | ICD-10-CM | POA: Diagnosis not present

## 2016-11-30 DIAGNOSIS — E7800 Pure hypercholesterolemia, unspecified: Secondary | ICD-10-CM | POA: Diagnosis not present

## 2016-11-30 DIAGNOSIS — I10 Essential (primary) hypertension: Secondary | ICD-10-CM | POA: Diagnosis not present

## 2016-11-30 DIAGNOSIS — Z0000 Encounter for general adult medical examination without abnormal findings: Secondary | ICD-10-CM | POA: Diagnosis not present

## 2016-12-02 ENCOUNTER — Telehealth: Payer: Self-pay | Admitting: Cardiology

## 2016-12-02 NOTE — Telephone Encounter (Signed)
holter company called pt had 50 beats of what they thought was VT,  Dr. Lovena Le was available and he reviewed strips and may be A fib.  Pt was asymptomatic with tachycardia.  I will send this to Dr. [name] and Dr. Shelia Media.

## 2016-12-04 NOTE — Telephone Encounter (Signed)
Not really sure how to connect the arrhythmia with the complaints he had with his PCP. Probably no connection at all. My first available is made. Probably should be seen sooner by APP.

## 2016-12-04 NOTE — Telephone Encounter (Signed)
Just got the copy. Agree could be VT or atrial flutter with aberrancy. MCr

## 2016-12-05 ENCOUNTER — Emergency Department (HOSPITAL_COMMUNITY)
Admission: EM | Admit: 2016-12-05 | Discharge: 2016-12-05 | Disposition: A | Discharge status: Home / Self Care | Payer: Medicare Other | Attending: Emergency Medicine | Admitting: Emergency Medicine

## 2016-12-05 ENCOUNTER — Emergency Department (HOSPITAL_COMMUNITY): Payer: Medicare Other

## 2016-12-05 ENCOUNTER — Encounter (HOSPITAL_COMMUNITY): Payer: Self-pay | Admitting: Emergency Medicine

## 2016-12-05 ENCOUNTER — Telehealth: Payer: Self-pay | Admitting: *Deleted

## 2016-12-05 DIAGNOSIS — I252 Old myocardial infarction: Secondary | ICD-10-CM | POA: Insufficient documentation

## 2016-12-05 DIAGNOSIS — R0602 Shortness of breath: Secondary | ICD-10-CM | POA: Diagnosis present

## 2016-12-05 DIAGNOSIS — Z79899 Other long term (current) drug therapy: Secondary | ICD-10-CM | POA: Diagnosis not present

## 2016-12-05 DIAGNOSIS — Z87891 Personal history of nicotine dependence: Secondary | ICD-10-CM | POA: Diagnosis not present

## 2016-12-05 DIAGNOSIS — R531 Weakness: Secondary | ICD-10-CM | POA: Diagnosis not present

## 2016-12-05 DIAGNOSIS — N183 Chronic kidney disease, stage 3 (moderate): Secondary | ICD-10-CM | POA: Diagnosis not present

## 2016-12-05 DIAGNOSIS — I2510 Atherosclerotic heart disease of native coronary artery without angina pectoris: Secondary | ICD-10-CM | POA: Diagnosis not present

## 2016-12-05 DIAGNOSIS — R1010 Upper abdominal pain, unspecified: Secondary | ICD-10-CM | POA: Diagnosis not present

## 2016-12-05 DIAGNOSIS — R000 Tachycardia, unspecified: Secondary | ICD-10-CM | POA: Insufficient documentation

## 2016-12-05 DIAGNOSIS — I129 Hypertensive chronic kidney disease with stage 1 through stage 4 chronic kidney disease, or unspecified chronic kidney disease: Secondary | ICD-10-CM | POA: Diagnosis not present

## 2016-12-05 DIAGNOSIS — R509 Fever, unspecified: Secondary | ICD-10-CM | POA: Diagnosis not present

## 2016-12-05 DIAGNOSIS — Z7982 Long term (current) use of aspirin: Secondary | ICD-10-CM | POA: Diagnosis not present

## 2016-12-05 DIAGNOSIS — Z85828 Personal history of other malignant neoplasm of skin: Secondary | ICD-10-CM | POA: Diagnosis not present

## 2016-12-05 LAB — BASIC METABOLIC PANEL
Anion gap: 9 (ref 5–15)
BUN: 21 mg/dL — AB (ref 6–20)
CALCIUM: 8.8 mg/dL — AB (ref 8.9–10.3)
CO2: 22 mmol/L (ref 22–32)
CREATININE: 1.54 mg/dL — AB (ref 0.61–1.24)
Chloride: 98 mmol/L — ABNORMAL LOW (ref 101–111)
GFR calc Af Amer: 47 mL/min — ABNORMAL LOW (ref >60)
GFR, EST NON AFRICAN AMERICAN: 40 mL/min — AB (ref >60)
GLUCOSE: 130 mg/dL — AB (ref 65–99)
Potassium: 3.9 mmol/L (ref 3.5–5.1)
SODIUM: 129 mmol/L — AB (ref 135–145)

## 2016-12-05 LAB — BRAIN NATRIURETIC PEPTIDE: B Natriuretic Peptide: 33.7 pg/mL (ref 0.0–100.0)

## 2016-12-05 LAB — I-STAT TROPONIN, ED
TROPONIN I, POC: 0.02 ng/mL (ref 0.00–0.08)
Troponin i, poc: 0 ng/mL (ref 0.00–0.08)

## 2016-12-05 LAB — CBC
HCT: 39.4 % (ref 39.0–52.0)
Hemoglobin: 13.6 g/dL (ref 13.0–17.0)
MCH: 30.3 pg (ref 26.0–34.0)
MCHC: 34.5 g/dL (ref 30.0–36.0)
MCV: 87.8 fL (ref 78.0–100.0)
Platelets: 169 10*3/uL (ref 150–400)
RBC: 4.49 MIL/uL (ref 4.22–5.81)
RDW: 14.4 % (ref 11.5–15.5)
WBC: 7.2 10*3/uL (ref 4.0–10.5)

## 2016-12-05 MED ORDER — ASPIRIN 81 MG PO CHEW
324.0000 mg | CHEWABLE_TABLET | Freq: Once | ORAL | Status: AC
Start: 2016-12-05 — End: 2016-12-05
  Administered 2016-12-05: 324 mg via ORAL
  Filled 2016-12-05: qty 4

## 2016-12-05 NOTE — ED Notes (Signed)
Patient given coke. 

## 2016-12-05 NOTE — ED Triage Notes (Signed)
Per EMS, patient from PCP c/o fever, chills, SOB, and generalized weakness x2 weeks. Hx afib. Denies chest pain and abdominal pain.

## 2016-12-05 NOTE — ED Provider Notes (Signed)
[facility] DEPT Provider Note   CSN: [id number] Arrival date & time: 12/05/16  1222     History   Chief Complaint Chief Complaint  Patient presents with  . Shortness of Breath    HPI Derrick [name] is a 83 y.o. male.  HPI Pt presents to the ED for evaluation of abnormal EKG findings in the setting of some shortness of breath.  Patient states over the last couple weeks he thought he was recovering from the flu. He had some trouble with fevers and chills earlier. He also had some coughing and some shortness of breath. He no longer is having any trouble with fever. He is not having as much coughing right now but he still has some generalized weakness and fatigue. He denies any trouble with any chest pain or abdominal pain. He denies any vomiting or diarrhea recently although he did have an episode when this illness first started. He went to see his primary care doctor today. In the office he had an EKG. His primary doctor was concerned about the EKG findings.  He felt the patient had atrial fibrillation and some T-wave changes. He was sent to the emergency room for further evaluation.  Patient again denies having any trouble with any chest pain. He has not noticed any palpitations. He states he does have history of atrial fibrillation. Past Medical History:  Diagnosis Date  . Anemia   . Anxiety   . Arthritis, rheumatoid ([place])   . BPH (benign prostatic hyperplasia)   . CAD (coronary artery disease)   . CKD (chronic kidney disease)   . COPD (chronic obstructive pulmonary disease) ([place])   . Depression   . Diverticulosis   . Elevated PSA   . GERD (gastroesophageal reflux disease)   . Herpes simplex labialis   . Hiatal hernia   . High cholesterol   . History of colonic polyps   . Hypertension   . Insomnia   . Lacunar infarction ([place])   . Obesity   . Panic attacks   . Prostate cancer ([place])   . Renal cell cancer ([place])    s/p nephrectomy in 2000  . Seropositive rheumatoid arthritis  ([place])   . Spermatocele   . Tinnitus   . Tremor of both hands     Patient Active Problem List   Diagnosis Date Noted  . Pre-syncope 11/21/2016  . Dyspnea on exertion 03/03/2016  . H/O unilateral nephrectomy 03/03/2016  . Arrhythmia 11/09/2015  . CAD (coronary artery disease) 11/09/2015  . Abnormal ECG 10/22/2013  . Shortness of breath 10/22/2013  . Chest pain at rest 11/18/2011  . Unstable angina ([place]) 11/18/2011  . Thrombocytopenia ([place]) 11/18/2011  . Anemia 11/18/2011  . CKD (chronic kidney disease), stage III 11/18/2011  . Hypertension   . High cholesterol   . GERD (gastroesophageal reflux disease)   . BPH (benign prostatic hyperplasia)     Past Surgical History:  Procedure Laterality Date  . BACK SURGERY    . LEFT HEART CATHETERIZATION WITH CORONARY ANGIOGRAM N/A 11/19/2011   Procedure: LEFT HEART CATHETERIZATION WITH CORONARY ANGIOGRAM;  Surgeon: [name], MD;  Location: [facility] CATH LAB;  Service: Cardiovascular;  Laterality: N/A;  . NEPHRECTOMY  10/1998       Home Medications    Prior to Admission medications   Medication Sig Start Date End Date Taking? Authorizing Provider  amitriptyline (ELAVIL) 25 MG tablet Take 25 mg by mouth at bedtime.   Yes Historical Provider, MD  aspirin EC 81  MG tablet Take 81 mg by mouth every evening.    Yes Historical Provider, MD  atorvastatin (LIPITOR) 80 MG tablet Take 80 mg by mouth daily.   Yes Historical Provider, MD  finasteride (PROSCAR) 5 MG tablet Take 5 mg by mouth daily.   Yes Historical Provider, MD  losartan-hydrochlorothiazide (HYZAAR) 100-12.5 MG per tablet Take 0.5 tablets by mouth daily.   Yes Historical Provider, MD  omega-3 acid ethyl esters (LOVAZA) 1 g capsule Take 2 g by mouth daily.   Yes Historical Provider, MD  pantoprazole (PROTONIX) 40 MG tablet Take 40 mg by mouth 2 (two) times daily.   Yes Historical Provider, MD  predniSONE (DELTASONE) 5 MG tablet Take 5 mg by mouth daily with breakfast.    Yes  Historical Provider, MD  traMADol (ULTRAM) 50 MG tablet Take 50 mg by mouth every 6 (six) hours as needed for moderate pain.   Yes Historical Provider, MD    Family History Family History  Problem Relation Age of Onset  . Heart attack Father 47  . Stroke Father   . Kidney failure Sister     Bright's disease   . Cancer Paternal Aunt     d. 6s; NOS cancer of leg  . Kidney disease Sister     d. 25; "Bright's disease" - possible kidney cancer?  . Cancer Sister     NOS cancer; d. 4s; "sick in the hospital"  . Heart attack Son 49  . Leukemia Son 29  . COPD Son   . Breast cancer Daughter 31    Social History Social History  Substance Use Topics  . Smoking status: Former Smoker    Packs/day: 1.00    Years: 29.00    Types: Cigarettes    Quit date: 10/08/1978  . Smokeless tobacco: Never Used     Comment: tried smokeless tobacco once but made him sick  . Alcohol use No     Comment: drank very little in his life - only socially      Allergies   Ace inhibitors   Review of Systems Review of Systems  All other systems reviewed and are negative.    Physical Exam Updated Vital Signs BP 138/93   Pulse 98   Temp 98.4 F (36.9 C) (Oral)   Resp 20   Ht 6' (1.829 m)   Wt 90.7 kg   SpO2 98%   BMI 27.12 kg/m   Physical Exam  Constitutional: He appears well-developed and well-nourished. No distress.  HENT:  Head: Normocephalic and atraumatic.  Right Ear: External ear normal.  Left Ear: External ear normal.  Eyes: Conjunctivae are normal. Right eye exhibits no discharge. Left eye exhibits no discharge. No scleral icterus.  Neck: Neck supple. No tracheal deviation present.  Cardiovascular: Normal rate, regular rhythm and intact distal pulses.   Pulmonary/Chest: Effort normal and breath sounds normal. No stridor. No respiratory distress. He has no wheezes. He has no rales.  Abdominal: Soft. Bowel sounds are normal. He exhibits no distension. There is no tenderness. There is  no rebound and no guarding.  Musculoskeletal: He exhibits no edema or tenderness.  Neurological: He is alert. He has normal strength. No cranial nerve deficit (no facial droop, extraocular movements intact, no slurred speech) or sensory deficit. He exhibits normal muscle tone. He displays no seizure activity. Coordination normal.  Skin: Skin is warm and dry. No rash noted.  Psychiatric: He has a normal mood and affect.  Nursing note and vitals reviewed.  ED Treatments / Results  Labs (all labs ordered are listed, but only abnormal results are displayed) Labs Reviewed  BASIC METABOLIC PANEL - Abnormal; Notable for the following:       Result Value   Sodium 129 (*)    Chloride 98 (*)    Glucose, Bld 130 (*)    BUN 21 (*)    Creatinine, Ser 1.54 (*)    Calcium 8.8 (*)    GFR calc non Af Amer 40 (*)    GFR calc Af Amer 47 (*)    All other components within normal limits  CBC  BRAIN NATRIURETIC PEPTIDE  I-STAT TROPOININ, ED  I-STAT TROPOININ, ED    EKG  EKG Interpretation  Date/Time:  Wednesday December 05 2016 12:41:40 EST Ventricular Rate:  96 PR Interval:    QRS Duration: 103 QT Interval:  321 QTC Calculation: 406 R Axis:   34 Text Interpretation:  Sinus arrhythmia Abnormal R-wave progression, early transition Abnormal T, consider ischemia, lateral leads lateral w tave changes are new since last tracing Confirmed by Anyae Griffith  MD-J, Vylet Maffia [id number]) on 12/05/2016 12:45:09 PM       Radiology Dg Chest Portable 1 View  Result Date: 12/05/2016 CLINICAL DATA:  Fever and chills EXAM: PORTABLE CHEST 1 VIEW COMPARISON:  02/07/2016 FINDINGS: Elevation of left hemidiaphragm is noted. The lungs are well aerated bilaterally. No focal infiltrate or sizable effusion is seen. No bony abnormality is noted. Cardiac shadow is within normal limits. IMPRESSION: No active disease. Electronically Signed   By: Inez Catalina M.D.   On: 12/05/2016 13:01    Procedures Procedures (including critical  care time)  Medications Ordered in ED Medications  aspirin chewable tablet 324 mg (324 mg Oral Given 12/05/16 1254)     Initial Impression / Assessment and Plan / ED Course  I have reviewed the triage vital signs and the nursing notes.  Pertinent labs & imaging results that were available during my care of the patient were reviewed by me and considered in my medical decision making (see chart for details).   patient was sent to the emergency room for evaluation after his primary care doctor noted atrial fibrillation and abnormal T wave changes on his EKG. Patient denies having any trouble with chest pain.  His EKG in the emergency room looks more like a sinus rhythm and not atrial fibrillation. However, the patient states he does have a history of atrial fibrillation.  The patient's cardiac enzymes are negative. He is not having any chest pain. I think he can safely follow up with his cardiologist.  Patient is feeling well and he would like to go home.  Final Clinical Impressions(s) / ED Diagnoses   Final diagnoses:  Tachycardia    New Prescriptions New Prescriptions   No medications on file     Dorie Rank, MD 12/05/16 1630

## 2016-12-05 NOTE — ED Notes (Signed)
Bed: WA03 Expected date:  Expected time:  Means of arrival:  Comments: EMS 

## 2016-12-05 NOTE — Telephone Encounter (Signed)
Received note:  Derrick [name], RMA   Cc: P Cv Div Nl Clinical Pool        Can someone call this pt and get them in this week?   Thanks!      See telephone note 2/25-needs appt with APP.   Attempt to call to schedule appointment-lmtcb.  Appt scheduled for Friday 3/2 at 9:30AM with Rosaria Ferries PA.

## 2016-12-06 ENCOUNTER — Ambulatory Visit: Payer: Medicare Other | Admitting: Neurology

## 2016-12-06 ENCOUNTER — Telehealth: Payer: Self-pay

## 2016-12-06 NOTE — Telephone Encounter (Signed)
Pt did not show for their appt with Dr. Rexene Alberts today.  12-06-16-Pt's wife called-pt is sick..said tried to call yesterday to cx but no answer.  sr

## 2016-12-06 NOTE — Telephone Encounter (Signed)
Spoke with wife on cell phone she states call back in 1 hour to schedule follow up appt pt was in the ER yesterday and she states that pt is "sick" and she doesn't think that he will come.

## 2016-12-07 ENCOUNTER — Ambulatory Visit: Payer: Medicare Other | Admitting: Physician Assistant

## 2016-12-07 NOTE — Telephone Encounter (Signed)
Left message for patient to call to schedule follow up appointment.

## 2016-12-11 ENCOUNTER — Encounter: Payer: Self-pay | Admitting: Neurology

## 2017-01-01 DIAGNOSIS — C61 Malignant neoplasm of prostate: Secondary | ICD-10-CM | POA: Diagnosis not present

## 2017-01-02 DIAGNOSIS — M0579 Rheumatoid arthritis with rheumatoid factor of multiple sites without organ or systems involvement: Secondary | ICD-10-CM | POA: Diagnosis not present

## 2017-01-07 DIAGNOSIS — C61 Malignant neoplasm of prostate: Secondary | ICD-10-CM | POA: Diagnosis not present

## 2017-01-07 DIAGNOSIS — N401 Enlarged prostate with lower urinary tract symptoms: Secondary | ICD-10-CM | POA: Diagnosis not present

## 2017-01-09 DIAGNOSIS — M79646 Pain in unspecified finger(s): Secondary | ICD-10-CM | POA: Diagnosis not present

## 2017-01-09 DIAGNOSIS — M1990 Unspecified osteoarthritis, unspecified site: Secondary | ICD-10-CM | POA: Diagnosis not present

## 2017-01-09 DIAGNOSIS — M0579 Rheumatoid arthritis with rheumatoid factor of multiple sites without organ or systems involvement: Secondary | ICD-10-CM | POA: Diagnosis not present

## 2017-03-14 ENCOUNTER — Ambulatory Visit (INDEPENDENT_AMBULATORY_CARE_PROVIDER_SITE_OTHER): Payer: Medicare Other | Admitting: Podiatry

## 2017-03-14 ENCOUNTER — Encounter: Payer: Self-pay | Admitting: Podiatry

## 2017-03-14 VITALS — BP 107/76 | HR 71 | Resp 16 | Ht 72.0 in | Wt 198.0 lb

## 2017-03-14 DIAGNOSIS — L03032 Cellulitis of left toe: Secondary | ICD-10-CM

## 2017-03-14 MED ORDER — CEPHALEXIN 500 MG PO CAPS: 500.0000 mg | ORAL_CAPSULE | Freq: Three times a day (TID) | ORAL | 1 refills | Status: DC

## 2017-03-14 NOTE — Progress Notes (Signed)
Subjective:    Patient ID: Derrick [name], male   DOB: 83 y.o.   MRN: [id number]   HPI patient presents stating he's had some redness in his big toe left with irritation of both the medial and lateral corners of the bed    Review of Systems  All other systems reviewed and are negative.       Objective:  Physical Exam  Cardiovascular: Intact distal pulses.   Musculoskeletal: Normal range of motion.  Neurological: He is alert.  Skin: Skin is warm.  Nursing note and vitals reviewed.  neuro status intact. The patient circulatory status was noted to be moderately diminished but does appear to be intact with good Flow noted muscle strength adequate range of motion within normal limits with patient noted to have on the left big toe redness with irritation of the lateral and medial border lateral being worse. There is incurvation of the bed with moderate damage     Assessment:    Paronychia infection of the left hallux medial lateral border localized with no proximal erythema edema noted     Plan:    H&P conditions reviewed and today infiltrated the left hallux 60 mg like Marcaine mixture remove the borders exposed matrix and cleaned out the bed with flushing the area. I then applied sterile dressing and placed on cephalexin 500 mg 3 times a day and gave strict instructions to let us know if any changes should occur and if patient were to start to develop claudication symptoms I will order vascular studies

## 2017-03-14 NOTE — Patient Instructions (Signed)

## 2017-03-14 NOTE — Progress Notes (Signed)
   Subjective:    Patient ID: Derrick [name], male    DOB: 08-17-1932, 83 y.o.   MRN: [id number]  HPI Chief Complaint  Patient presents with  . Nail Problem    Left foot; great toe-both sides; pt stated, "Has hurt for the past 1-2 weeks; wants checked to see if have ingrown toenail"      Review of Systems  HENT: Positive for tinnitus.   Respiratory: Positive for shortness of breath.   Gastrointestinal: Positive for constipation.  Musculoskeletal: Positive for arthralgias.  Hematological: Bruises/bleeds easily.  All other systems reviewed and are negative.      Objective:   Physical Exam        Assessment & Plan:

## 2017-03-28 ENCOUNTER — Ambulatory Visit: Payer: Medicare Other | Admitting: Podiatry

## 2017-05-10 ENCOUNTER — Encounter: Payer: Self-pay | Admitting: Podiatry

## 2017-05-10 ENCOUNTER — Ambulatory Visit (INDEPENDENT_AMBULATORY_CARE_PROVIDER_SITE_OTHER): Payer: Medicare Other | Admitting: Podiatry

## 2017-05-10 DIAGNOSIS — L03031 Cellulitis of right toe: Secondary | ICD-10-CM

## 2017-05-10 MED ORDER — CEPHALEXIN 500 MG PO CAPS: 500.0000 mg | ORAL_CAPSULE | Freq: Three times a day (TID) | ORAL | 1 refills | Status: DC

## 2017-05-10 NOTE — Progress Notes (Signed)
Subjective:    Patient ID: Derrick [name], male   DOB: 83 y.o.   MRN: [id number]   HPI patient states that he started develop a lot of pain in the medial border of the right hallux over the last few weeks and it's been local to this area    ROS      Objective:  Physical Exam neurovascular status found to be intact with patient found to have good digital perfusion and on the medial border of the right hallux there is redness and incurvation with mild pain. It is localized and I did not note proximal streaking and there is no systemic indications of infection with patient having normal temperature and no history of chills or other systemic pathology   Assessment:   Paronychia which is been the most part local right hallux     Plan:   Due to the fact that there is quite a bit of drainage I am placing him on antibiotic and I placed him on cephalexin 500 mg 3 times a day. I infiltrated the right hallux 60 Milligan second marking mixture sterile conditions remove the medial border did not note any purulent pus or nothing to culture and after removing all abscess tissue I applied sterile dressing. Reappoint to recheck again and was given strict instructions of any redness should occur proximal swelling drainage or any systemic signs of infection to go straight to the emergency room

## 2017-05-10 NOTE — Patient Instructions (Signed)

## 2017-05-13 DIAGNOSIS — L03031 Cellulitis of right toe: Secondary | ICD-10-CM | POA: Diagnosis not present

## 2017-05-14 ENCOUNTER — Encounter: Payer: Self-pay | Admitting: Podiatry

## 2017-05-14 ENCOUNTER — Telehealth: Payer: Self-pay | Admitting: *Deleted

## 2017-05-14 ENCOUNTER — Ambulatory Visit (INDEPENDENT_AMBULATORY_CARE_PROVIDER_SITE_OTHER): Payer: Self-pay | Admitting: Podiatry

## 2017-05-14 ENCOUNTER — Other Ambulatory Visit: Payer: Self-pay | Admitting: Podiatry

## 2017-05-14 VITALS — Temp 98.7°F

## 2017-05-14 DIAGNOSIS — I96 Gangrene, not elsewhere classified: Secondary | ICD-10-CM

## 2017-05-14 DIAGNOSIS — I739 Peripheral vascular disease, unspecified: Secondary | ICD-10-CM

## 2017-05-14 DIAGNOSIS — Z09 Encounter for follow-up examination after completed treatment for conditions other than malignant neoplasm: Secondary | ICD-10-CM

## 2017-05-14 MED ORDER — METHYLPREDNISOLONE 4 MG PO TBPK: ORAL_TABLET | ORAL | 0 refills | Status: DC

## 2017-05-14 NOTE — Telephone Encounter (Addendum)
Dr. Prudence Davidson states pt needs to be seen asap for blackened tip right hallux after toenail procedure. [facility] Interventional Radiology scheduled with Dr. Pascal Lux on Thursday 05/16/2017 at 2:15pm. I gave pt the appt time.05/15/2017-DrPrudence Davidson ordered pt to take 325mg  Aspirin one tablet 3-4 times today and 1-2 times tomorrow until seen at [facility]. I informed pt's wife, [name].

## 2017-05-14 NOTE — Telephone Encounter (Signed)
Dr Paulla Dolly

## 2017-05-14 NOTE — Progress Notes (Signed)
This patient presents the office stating that he had nail surgery for the correction of a painful ingrowing toenail right big toe.  Dr. Paulla Dolly evaluated and treated him with an incision and drainage of the paronychia.  He was given home soaking instructions and cephalexin   tablets were prescribed.  He states that following his surgery. His toe became pink and the skin became shiny and inflamed. He says he also developed a blackened area on the tip of the right big toe.Marland Kitchen He says this is very painful at night and he has tremendous difficulty sleeping.  His temperature is 98.7. He presents the office today for an evaluation and treatment of this right big toe.  He also says on Saturday his complete toenail fell off the right big toe.  He has a similar procedure performed in June which healed uneventfully.    Vascular examination reveals weak pulses  both feet.  Examination of right hallux reveals ring of demarcation between her normal skin and inflamed skin with desquamation.  Minimal redness extends from hallux over dorsum right foot.  .  Absence of nail right hallux toenail.  Distal aspect right hallux has skin necrosis noted.  Cold temperature noted distal IPJ  right hallux..    Vascular event right hallux.  Post op surgery right hallux.   . Return office visit.  Determined the right hallux revealed evidence of vascular changes following his nail surgery last week..  Told him to continue on his antibiotics.  Neosporin dry sterile dressing was applied to the hallux.  Contacted the vascular specialist and he has an appointment this coming Thursday afternoon for vascular testing and consulting.  Patient to return to the office or to the vascular doctor per the vascular doctors recommendation .  To call in AM to inform his to take ASA.     Gardiner Barefoot DPM-

## 2017-05-15 DIAGNOSIS — L03031 Cellulitis of right toe: Secondary | ICD-10-CM | POA: Diagnosis not present

## 2017-05-15 DIAGNOSIS — I998 Other disorder of circulatory system: Secondary | ICD-10-CM | POA: Diagnosis not present

## 2017-05-16 ENCOUNTER — Ambulatory Visit
Admission: RE | Admit: 2017-05-16 | Discharge: 2017-05-16 | Disposition: A | Discharge status: Home / Self Care | Payer: Medicare Other | Source: Ambulatory Visit | Attending: Podiatry | Admitting: Podiatry

## 2017-05-16 ENCOUNTER — Other Ambulatory Visit: Payer: Self-pay | Admitting: *Deleted

## 2017-05-16 ENCOUNTER — Other Ambulatory Visit (HOSPITAL_COMMUNITY): Payer: Self-pay | Admitting: Interventional Radiology

## 2017-05-16 DIAGNOSIS — I739 Peripheral vascular disease, unspecified: Secondary | ICD-10-CM

## 2017-05-16 DIAGNOSIS — I96 Gangrene, not elsewhere classified: Secondary | ICD-10-CM

## 2017-05-16 DIAGNOSIS — S91101A Unspecified open wound of right great toe without damage to nail, initial encounter: Secondary | ICD-10-CM | POA: Diagnosis not present

## 2017-05-16 DIAGNOSIS — I70269 Atherosclerosis of native arteries of extremities with gangrene, unspecified extremity: Secondary | ICD-10-CM | POA: Diagnosis not present

## 2017-05-16 HISTORY — PX: IR RADIOLOGIST EVAL & MGMT: IMG5224

## 2017-05-16 NOTE — Consult Note (Signed)
Chief Complaint: Nonhealing wound involving the right great toe  Referring Physician(s): Mayer,Gregory  History of Present Illness: Derrick [name] is a 83 y.o. male with past medical history significant for CAD, COPD, hypertension, renal cell carcinoma (post nephrectomy in 2000), chronic kidney disease and rheumatoid arthritis who underwent incision and drainage of the paronychia of the right great toe on 8/2, whose post operative course has been complicated by development of marked surrounding erythema as well as a blackened area on the tip of the right great toe. As such, he presents to the interventional radiology clinic for vascular evaluation. The patient is accompanied by his wife though serves as his own historian.  Patient has a remote history of smoking (grade than 40 years ago). Patient denies claudication symptoms though states that his sensation within his bilateral feet is reduced. Patient denies history of diabetes.  Of note, patient had a similar operation performed on the contralateral left great toe though this has healed without incident.     Past Medical History:  Diagnosis Date  . Anemia   . Anxiety   . Arthritis, rheumatoid ([place])   . BPH (benign prostatic hyperplasia)   . CAD (coronary artery disease)   . CKD (chronic kidney disease)   . COPD (chronic obstructive pulmonary disease) ([place])   . Depression   . Diverticulosis   . Elevated PSA   . GERD (gastroesophageal reflux disease)   . Herpes simplex labialis   . Hiatal hernia   . High cholesterol   . History of colonic polyps   . Hypertension   . Insomnia   . Lacunar infarction ([place])   . Obesity   . Panic attacks   . Prostate cancer ([place])   . Renal cell cancer ([place])    s/p nephrectomy in 2000  . Seropositive rheumatoid arthritis ([place])   . Spermatocele   . Tinnitus   . Tremor of both hands     Past Surgical History:  Procedure Laterality Date  . BACK SURGERY    . LEFT HEART CATHETERIZATION  WITH CORONARY ANGIOGRAM N/A 11/19/2011   Procedure: LEFT HEART CATHETERIZATION WITH CORONARY ANGIOGRAM;  Surgeon: [name], MD;  Location: [facility] CATH LAB;  Service: Cardiovascular;  Laterality: N/A;  . NEPHRECTOMY  10/1998    Allergies: Ace inhibitors  Medications: Prior to Admission medications   Medication Sig Start Date End Date Taking? Authorizing Provider  amitriptyline (ELAVIL) 25 MG tablet Take 25 mg by mouth at bedtime.   Yes [provider]  aspirin EC 81 MG tablet Take 81 mg by mouth every evening.    Yes [provider]  cephALEXin (KEFLEX) 500 MG capsule Take 1 capsule (500 mg total) by mouth 3 (three) times daily. 05/10/17  Yes Wallene Huh, DPM  finasteride (PROSCAR) 5 MG tablet Take 5 mg by mouth daily.   Yes [provider]  hydroxychloroquine (PLAQUENIL) 200 MG tablet  03/02/17  Yes [provider]  losartan-hydrochlorothiazide (HYZAAR) 100-12.5 MG per tablet Take 0.5 tablets by mouth daily.   Yes [provider]  methylPREDNISolone (MEDROL DOSEPAK) 4 MG TBPK tablet As directed 05/14/17  Yes Gardiner Barefoot, DPM  omega-3 acid ethyl esters (LOVAZA) 1 g capsule Take 2 g by mouth daily.   Yes [provider]  pantoprazole (PROTONIX) 40 MG tablet Take 40 mg by mouth 2 (two) times daily.   Yes [provider]  predniSONE (DELTASONE) 5 MG tablet Take 5 mg by mouth daily with breakfast.  Yes [provider]  traMADol (ULTRAM) 50 MG tablet Take 50 mg by mouth every 6 (six) hours as needed for moderate pain.   Yes [provider]     Family History  Problem Relation Age of Onset  . Heart attack Father 39  . Stroke Father   . Kidney failure Sister        Bright's disease   . Cancer Paternal Aunt        d. 51s; NOS cancer of leg  . Kidney disease Sister        d. 41; "Bright's disease" - possible kidney cancer?  . Cancer Sister        NOS cancer; d. 41s; "sick in the hospital"  . Heart attack  Son 63  . Leukemia Son 38  . COPD Son   . Breast cancer Daughter 33    Social History   Social History  . Marital status: Married    Spouse name: N/A  . Number of children: N/A  . Years of education: N/A   Social History Main Topics  . Smoking status: Former Smoker    Packs/day: 1.00    Years: 29.00    Types: Cigarettes    Quit date: 10/08/1978  . Smokeless tobacco: Never Used     Comment: tried smokeless tobacco once but made him sick  . Alcohol use No     Comment: drank very little in his life - only socially   . Drug use: No  . Sexual activity: Not on file   Other Topics Concern  . Not on file   Social History Narrative   Retired   Married   4 children          ECOG Status: 1 - Symptomatic but completely ambulatory  Review of Systems: A 12 point ROS discussed and pertinent positives are indicated in the HPI above.  All other systems are negative.  Review of Systems  Constitutional: Positive for activity change. Negative for fever.  Respiratory: Negative.   Cardiovascular: Negative.   Musculoskeletal: Positive for gait problem.  Skin: Positive for wound.    Vital Signs: BP (!) 140/91   Pulse 79   Temp 97.9 F (36.6 C) (Oral)   Resp 15   Ht 6' (1.829 m)   Wt 200 lb (90.7 kg)   SpO2 98%   BMI 27.12 kg/m   Physical Exam  Constitutional: He appears well-developed and well-nourished.  Cardiovascular:  Irregular heart rhythm.  I am able to palpate the bilateral common femoral and popliteal arterial pulses, however I am unable to palpate any pulses within the feet  I am able to obtain Doppler waveforms of the bilateral posterior tibial and the left dorsalis pedis arterial pulses.  I am unable to obtain Doppler waveforms of the right dorsalis artery.  Skin:  There is erythema surrounding the distal half of the great toe with a ring of desquamation between the normal skin and inflamed skin with desquamation.  There is absence of the nail of the right  great toe.  There is focal necrosis and eschar involving the tip of the right great toe.    Imaging: US Arterial Seg Multiple  Result Date: 05/16/2017 CLINICAL DATA:  Infection following trimming of the right great toenail. Evaluate for PAD. History of smoking. No history of diabetes. Patient denies claudication symptoms. EXAM: NONINVASIVE PHYSIOLOGIC VASCULAR STUDY OF BILATERAL LOWER EXTREMITIES TECHNIQUE: Evaluation of both lower extremities was performed at rest, including calculation of ankle-brachial indices,  multiple segmental pressure evaluation, segmental Doppler and segmental pulse volume recording. COMPARISON:  None. FINDINGS: Right ABI: Unable to be obtained secondary to noncompressibility of the distal vasculature. Left ABI: Unable to be obtained secondary to noncompressibility of the distal vasculature. Right Lower Extremity: Biphasic waveforms are demonstrated within the right femoral and popliteal arteries. Monophasic waveforms are demonstrated within the right posterior tibial and dorsalis pedis arteries. Left Lower Extremity: Biphasic waveforms are demonstrated within the left femoral and popliteal arteries. Monophasic waveforms are demonstrated within left posterior tibial and dorsalis pedis arteries. Blunted monophasic PVR waveforms are demonstrated at the level of the left ankle. No significant PVR waveforms are acquired at the level of either great digit. IMPRESSION: 1. Inability to acquire ABIs within either lower extremity secondary to noncompressibility of the distal vasculature. 2. Abnormal arterial and PVR waveforms potentially indicative of hemodynamically significant vasoocclusive disease. Clinical correlation is advised. Further evaluation with CTA run-off could be performed as clinically indicated. Electronically Signed   By: Sandi Mariscal M.D.   On: 05/16/2017 15:42    Labs:  CBC:  Recent Labs  12/05/16 1258  WBC 7.2  HGB 13.6  HCT 39.4  PLT 169    COAGS: No  results for input(s): INR, APTT in the last 8760 hours.  BMP:  Recent Labs  12/05/16 1258  NA 129*  K 3.9  CL 98*  CO2 22  GLUCOSE 130*  BUN 21*  CALCIUM 8.8*  CREATININE 1.54*  GFRNONAA 40*  GFRAA 47*    LIVER FUNCTION TESTS: No results for input(s): BILITOT, AST, ALT, ALKPHOS, PROT, ALBUMIN in the last 8760 hours.  TUMOR MARKERS: No results for input(s): AFPTM, CEA, CA199, CHROMGRNA in the last 8760 hours.  Assessment and Plan:  Derrick [name] is a 83 y.o. male with past medical history significant for CAD, COPD, hypertension, renal cell carcinoma (post nephrectomy in 2000), chronic kidney disease and rheumatoid arthritis who underwent incision and drainage of the paronychia of the right great toe on 8/2, whose post operative course has been complicated by development of marked surrounding erythema as well as a blackened area on the tip of the right great toe.   Unfortunately, the noninvasive vascular examination performed earlier today was unable to acquire ABIs secondary to noncompressibility of the distal vasculature. There are however abnormal waveforms demonstrated throughout the bilateral lower extremity arterial system.  I am unable to palpate pulses within either feet, however I am able to Doppler the bilateral posterior tibial and the left dorsalis pedis arteries. I am unable to Doppler the right dorsalis pedis artery.  Physical examination is worrisome for a superimposed vascular injury affecting the great digit with ring of demarcation between the normal and abnormal tissue and focal eschar involving the tip of the toe.  As such, I will proceed with obtaining a CTA run off of the bilateral lower extremities to evaluate for potential a lesion amendable to potential percutaneous intervention.  Following the acquisition of this scan, I will call the patient to formulate a definitive plan of care. At that time, I will also consider starting the patient on Plavix.  Patient was  encouraged to continue taking his aspirin as prescribed.  Thank you for this interesting consult.  I greatly enjoyed meeting Derrick [name] and look forward to participating in their care.  A copy of this report was sent to the requesting provider on this date.  Electronically Signed: KAREM, TOMASO 05/16/2017, 4:43 PM   I spent a total of 30  Minutes in face to face in clinical consultation, greater than 50% of which was counseling/coordinating care for nonhealing wound involving the right great toe

## 2017-05-22 ENCOUNTER — Ambulatory Visit (HOSPITAL_COMMUNITY)
Admission: RE | Admit: 2017-05-22 | Discharge: 2017-05-22 | Disposition: A | Discharge status: Home / Self Care | Payer: Medicare Other | Source: Ambulatory Visit | Attending: Interventional Radiology | Admitting: Interventional Radiology

## 2017-05-22 DIAGNOSIS — K5730 Diverticulosis of large intestine without perforation or abscess without bleeding: Secondary | ICD-10-CM | POA: Diagnosis not present

## 2017-05-22 DIAGNOSIS — L97519 Non-pressure chronic ulcer of other part of right foot with unspecified severity: Secondary | ICD-10-CM | POA: Diagnosis not present

## 2017-05-22 DIAGNOSIS — I700 Atherosclerosis of aorta: Secondary | ICD-10-CM | POA: Diagnosis not present

## 2017-05-22 DIAGNOSIS — I739 Peripheral vascular disease, unspecified: Secondary | ICD-10-CM | POA: Diagnosis not present

## 2017-05-22 DIAGNOSIS — I96 Gangrene, not elsewhere classified: Secondary | ICD-10-CM | POA: Diagnosis present

## 2017-05-22 DIAGNOSIS — Z905 Acquired absence of kidney: Secondary | ICD-10-CM | POA: Insufficient documentation

## 2017-05-22 DIAGNOSIS — N400 Enlarged prostate without lower urinary tract symptoms: Secondary | ICD-10-CM | POA: Diagnosis not present

## 2017-05-22 LAB — POCT I-STAT CREATININE: Creatinine, Ser: 1.5 mg/dL — ABNORMAL HIGH (ref 0.61–1.24)

## 2017-05-22 MED ORDER — IOPAMIDOL (ISOVUE-370) INJECTION 76%
100.0000 mL | Freq: Once | INTRAVENOUS | Status: AC | PRN
  Administered 2017-05-22: 80 mL via INTRAVENOUS

## 2017-05-22 MED ORDER — IOPAMIDOL (ISOVUE-370) INJECTION 76%
INTRAVENOUS | Status: AC
  Filled 2017-05-22: qty 100

## 2017-05-23 ENCOUNTER — Telehealth: Payer: Self-pay | Admitting: Interventional Radiology

## 2017-05-23 NOTE — Telephone Encounter (Signed)
Discussed results of CTA with patient.  I explained that while there is no proximal stenosis, there is limited evaluation of the distal tibial vasculature given the crescentic distal vascular calcifications and offered him an angiogram with potential intervention as a means of ensuring/improving blood flow to the digit to increase the chance of healing and decrease the risk of infection.  The patient states that while the overall appearance of his toe is unchanged, the pain is improved and at this time he wants to delay/avoid an arteriogram.  Pt was given the IR clinic number ([phone]) and instructed to call if symptoms worsen by early next week as my partner, Dr. [name] (who has also review the case) could perform an arteriogram at [facility] on Thursday.  Otherwise, I will call the patient the following week (the week of 8/27) to ensure continued subjective improvement.  Pt instructed to continue his aspirin as prescribed.  Ronny Bacon, MD Pager #: [phone]

## 2017-05-24 ENCOUNTER — Ambulatory Visit: Payer: Medicare Other | Admitting: Podiatry

## 2017-05-28 ENCOUNTER — Other Ambulatory Visit (HOSPITAL_COMMUNITY): Payer: Self-pay | Admitting: Interventional Radiology

## 2017-05-28 DIAGNOSIS — I739 Peripheral vascular disease, unspecified: Secondary | ICD-10-CM

## 2017-05-28 DIAGNOSIS — I96 Gangrene, not elsewhere classified: Secondary | ICD-10-CM

## 2017-05-29 ENCOUNTER — Other Ambulatory Visit: Payer: Self-pay | Admitting: Radiology

## 2017-05-30 ENCOUNTER — Encounter (HOSPITAL_COMMUNITY): Payer: Self-pay

## 2017-05-30 ENCOUNTER — Ambulatory Visit (HOSPITAL_COMMUNITY)
Admission: RE | Admit: 2017-05-30 | Discharge: 2017-05-30 | Disposition: A | Discharge status: Home / Self Care | Payer: Medicare Other | Source: Ambulatory Visit | Attending: Interventional Radiology | Admitting: Interventional Radiology

## 2017-05-30 ENCOUNTER — Other Ambulatory Visit (HOSPITAL_COMMUNITY): Payer: Self-pay | Admitting: Interventional Radiology

## 2017-05-30 DIAGNOSIS — M059 Rheumatoid arthritis with rheumatoid factor, unspecified: Secondary | ICD-10-CM | POA: Diagnosis not present

## 2017-05-30 DIAGNOSIS — G4700 Insomnia, unspecified: Secondary | ICD-10-CM | POA: Insufficient documentation

## 2017-05-30 DIAGNOSIS — E7800 Pure hypercholesterolemia, unspecified: Secondary | ICD-10-CM | POA: Diagnosis not present

## 2017-05-30 DIAGNOSIS — I739 Peripheral vascular disease, unspecified: Secondary | ICD-10-CM

## 2017-05-30 DIAGNOSIS — J449 Chronic obstructive pulmonary disease, unspecified: Secondary | ICD-10-CM | POA: Diagnosis not present

## 2017-05-30 DIAGNOSIS — I70261 Atherosclerosis of native arteries of extremities with gangrene, right leg: Secondary | ICD-10-CM | POA: Diagnosis not present

## 2017-05-30 DIAGNOSIS — I96 Gangrene, not elsewhere classified: Secondary | ICD-10-CM

## 2017-05-30 DIAGNOSIS — Z87891 Personal history of nicotine dependence: Secondary | ICD-10-CM | POA: Insufficient documentation

## 2017-05-30 DIAGNOSIS — K219 Gastro-esophageal reflux disease without esophagitis: Secondary | ICD-10-CM | POA: Diagnosis not present

## 2017-05-30 DIAGNOSIS — K449 Diaphragmatic hernia without obstruction or gangrene: Secondary | ICD-10-CM | POA: Insufficient documentation

## 2017-05-30 DIAGNOSIS — Z8249 Family history of ischemic heart disease and other diseases of the circulatory system: Secondary | ICD-10-CM | POA: Insufficient documentation

## 2017-05-30 DIAGNOSIS — I129 Hypertensive chronic kidney disease with stage 1 through stage 4 chronic kidney disease, or unspecified chronic kidney disease: Secondary | ICD-10-CM | POA: Diagnosis not present

## 2017-05-30 DIAGNOSIS — Z905 Acquired absence of kidney: Secondary | ICD-10-CM | POA: Insufficient documentation

## 2017-05-30 DIAGNOSIS — F419 Anxiety disorder, unspecified: Secondary | ICD-10-CM | POA: Insufficient documentation

## 2017-05-30 DIAGNOSIS — Z8673 Personal history of transient ischemic attack (TIA), and cerebral infarction without residual deficits: Secondary | ICD-10-CM | POA: Diagnosis not present

## 2017-05-30 DIAGNOSIS — N400 Enlarged prostate without lower urinary tract symptoms: Secondary | ICD-10-CM | POA: Insufficient documentation

## 2017-05-30 DIAGNOSIS — L97519 Non-pressure chronic ulcer of other part of right foot with unspecified severity: Secondary | ICD-10-CM | POA: Diagnosis not present

## 2017-05-30 DIAGNOSIS — F329 Major depressive disorder, single episode, unspecified: Secondary | ICD-10-CM | POA: Insufficient documentation

## 2017-05-30 DIAGNOSIS — E669 Obesity, unspecified: Secondary | ICD-10-CM | POA: Insufficient documentation

## 2017-05-30 DIAGNOSIS — I2510 Atherosclerotic heart disease of native coronary artery without angina pectoris: Secondary | ICD-10-CM | POA: Diagnosis not present

## 2017-05-30 DIAGNOSIS — Z85528 Personal history of other malignant neoplasm of kidney: Secondary | ICD-10-CM | POA: Diagnosis not present

## 2017-05-30 DIAGNOSIS — N189 Chronic kidney disease, unspecified: Secondary | ICD-10-CM | POA: Insufficient documentation

## 2017-05-30 DIAGNOSIS — I771 Stricture of artery: Secondary | ICD-10-CM | POA: Diagnosis not present

## 2017-05-30 HISTORY — PX: IR ANGIOGRAM EXTREMITY RIGHT: IMG652

## 2017-05-30 HISTORY — PX: IR TIB-PERO ART PTA MOD SED: IMG2313

## 2017-05-30 HISTORY — PX: IR TIB-PERO ART UNI PTA EA ADD VESSEL MOD SED: IMG2317

## 2017-05-30 HISTORY — PX: IR US GUIDE VASC ACCESS RIGHT: IMG2390

## 2017-05-30 LAB — BASIC METABOLIC PANEL
Anion gap: 9 (ref 5–15)
BUN: 13 mg/dL (ref 6–20)
CHLORIDE: 99 mmol/L — AB (ref 101–111)
CO2: 23 mmol/L (ref 22–32)
Calcium: 8.9 mg/dL (ref 8.9–10.3)
Creatinine, Ser: 1.36 mg/dL — ABNORMAL HIGH (ref 0.61–1.24)
GFR calc Af Amer: 54 mL/min — ABNORMAL LOW (ref >60)
GFR calc non Af Amer: 47 mL/min — ABNORMAL LOW (ref >60)
Glucose, Bld: 97 mg/dL (ref 65–99)
POTASSIUM: 3.8 mmol/L (ref 3.5–5.1)
SODIUM: 131 mmol/L — AB (ref 135–145)

## 2017-05-30 LAB — CBC
HEMATOCRIT: 35 % — AB (ref 39.0–52.0)
Hemoglobin: 11.8 g/dL — ABNORMAL LOW (ref 13.0–17.0)
MCH: 29.1 pg (ref 26.0–34.0)
MCHC: 33.7 g/dL (ref 30.0–36.0)
MCV: 86.2 fL (ref 78.0–100.0)
Platelets: 183 10*3/uL (ref 150–400)
RBC: 4.06 MIL/uL — ABNORMAL LOW (ref 4.22–5.81)
RDW: 14.1 % (ref 11.5–15.5)
WBC: 6.5 10*3/uL (ref 4.0–10.5)

## 2017-05-30 LAB — PROTIME-INR
INR: 1.1
Prothrombin Time: 14.3 seconds (ref 11.4–15.2)

## 2017-05-30 LAB — POCT ACTIVATED CLOTTING TIME: Activated Clotting Time: 191 seconds

## 2017-05-30 LAB — APTT: aPTT: 31 seconds (ref 24–36)

## 2017-05-30 MED ORDER — CLOPIDOGREL BISULFATE 75 MG PO TABS
300.0000 mg | ORAL_TABLET | Freq: Once | ORAL | Status: AC
  Administered 2017-05-30: 300 mg via ORAL

## 2017-05-30 MED ORDER — LIDOCAINE HCL (PF) 1 % IJ SOLN
INTRAMUSCULAR | Status: AC
  Filled 2017-05-30: qty 30

## 2017-05-30 MED ORDER — NITROGLYCERIN 1 MG/10 ML FOR IR/CATH LAB
INTRA_ARTERIAL | Status: AC
  Filled 2017-05-30: qty 10

## 2017-05-30 MED ORDER — FENTANYL CITRATE (PF) 100 MCG/2ML IJ SOLN
INTRAMUSCULAR | Status: DC | PRN
  Administered 2017-05-30: 25 ug via INTRAVENOUS
  Administered 2017-05-30: 100 ug via INTRAVENOUS

## 2017-05-30 MED ORDER — SODIUM CHLORIDE 0.9 % IV SOLN
INTRAVENOUS | Status: DC
  Administered 2017-05-30: 07:00:00 via INTRAVENOUS

## 2017-05-30 MED ORDER — HEPARIN SODIUM (PORCINE) 1000 UNIT/ML IJ SOLN
INTRAMUSCULAR | Status: AC
  Filled 2017-05-30: qty 1

## 2017-05-30 MED ORDER — MIDAZOLAM HCL 2 MG/2ML IJ SOLN
INTRAMUSCULAR | Status: DC | PRN
  Administered 2017-05-30: 1 mg via INTRAVENOUS
  Administered 2017-05-30: 0.5 mg via INTRAVENOUS
  Administered 2017-05-30: 1 mg via INTRAVENOUS
  Administered 2017-05-30: 2 mg via INTRAVENOUS

## 2017-05-30 MED ORDER — IODIXANOL 320 MG/ML IV SOLN: 125.0000 mL | Freq: Once | INTRAVENOUS | Status: DC | PRN

## 2017-05-30 MED ORDER — ASPIRIN 325 MG PO TABS
ORAL_TABLET | ORAL | Status: AC
  Administered 2017-05-30: 325 mg
  Filled 2017-05-30: qty 1

## 2017-05-30 MED ORDER — FENTANYL CITRATE (PF) 100 MCG/2ML IJ SOLN
INTRAMUSCULAR | Status: AC
  Filled 2017-05-30: qty 6

## 2017-05-30 MED ORDER — CLOPIDOGREL BISULFATE 300 MG PO TABS
ORAL_TABLET | ORAL | Status: AC
  Filled 2017-05-30: qty 1

## 2017-05-30 MED ORDER — HEPARIN SODIUM (PORCINE) 1000 UNIT/ML IJ SOLN
INTRAMUSCULAR | Status: DC | PRN
Start: 2017-05-30 — End: 2017-05-31
  Administered 2017-05-30: 7000 [IU] via INTRAVENOUS

## 2017-05-30 MED ORDER — LIDOCAINE HCL (PF) 1 % IJ SOLN
INTRAMUSCULAR | Status: DC | PRN
  Administered 2017-05-30: 10 mL

## 2017-05-30 MED ORDER — ASPIRIN 325 MG PO TABS: 325.0000 mg | ORAL_TABLET | Freq: Every day | ORAL | Status: DC

## 2017-05-30 MED ORDER — NITROGLYCERIN 1 MG/10 ML FOR IR/CATH LAB
INTRA_ARTERIAL | Status: DC | PRN
  Administered 2017-05-30: 300 ug via INTRA_ARTERIAL
  Administered 2017-05-30 (×2): 200 ug via INTRA_ARTERIAL

## 2017-05-30 MED ORDER — MIDAZOLAM HCL 2 MG/2ML IJ SOLN
INTRAMUSCULAR | Status: AC
  Filled 2017-05-30: qty 6

## 2017-05-30 NOTE — Sedation Documentation (Signed)
Patient is resting comfortably. 

## 2017-05-30 NOTE — H&P (Signed)
Chief Complaint: nonhealing right big toe wound with PAD  Referring Physician:Dr. Gardiner Barefoot  Supervising Physician: Corrie Mckusick  Patient Status: [facility] - Out-pt  HPI: Derrick [name] is a 83 y.o. male with a history of PAD resulting in a nonhealing right big toe wound.  He has been followed by Dr. Belenda Cruise, but was referred to Dr. Pascal Lux for evaluation.  He was found to not have palpable pedal pulses, but doppler pulses.  A CTA was then ordered and distal vascular calcifications were noted.  An angiogram with possible intervention was offered to the RLE at this time secondary to his nonhealing wound.  He presents today for this procedure.  He has no complaints.  Past Medical History:  Past Medical History:  Diagnosis Date  . Anemia   . Anxiety   . Arthritis, rheumatoid ([place])   . BPH (benign prostatic hyperplasia)   . CAD (coronary artery disease)   . CKD (chronic kidney disease)   . COPD (chronic obstructive pulmonary disease) ([place])   . Depression   . Diverticulosis   . Elevated PSA   . GERD (gastroesophageal reflux disease)   . Herpes simplex labialis   . Hiatal hernia   . High cholesterol   . History of colonic polyps   . Hypertension   . Insomnia   . Lacunar infarction ([place])   . Obesity   . Panic attacks   . Prostate cancer ([place])   . Renal cell cancer ([place])    s/p nephrectomy in 2000  . Seropositive rheumatoid arthritis ([place])   . Spermatocele   . Tinnitus   . Tremor of both hands     Past Surgical History:  Past Surgical History:  Procedure Laterality Date  . BACK SURGERY    . LEFT HEART CATHETERIZATION WITH CORONARY ANGIOGRAM N/A 11/19/2011   Procedure: LEFT HEART CATHETERIZATION WITH CORONARY ANGIOGRAM;  Surgeon: [name], MD;  Location: [facility] CATH LAB;  Service: Cardiovascular;  Laterality: N/A;  . NEPHRECTOMY  10/1998    Family History:  Family History  Problem Relation Age of Onset  . Heart attack Father 50  . Stroke Father   . Kidney failure  Sister        Bright's disease   . Cancer Paternal Aunt        d. 87s; NOS cancer of leg  . Kidney disease Sister        d. 58; "Bright's disease" - possible kidney cancer?  . Cancer Sister        NOS cancer; d. 29s; "sick in the hospital"  . Heart attack Son 52  . Leukemia Son 23  . COPD Son   . Breast cancer Daughter 62    Social History:  reports that he quit smoking about 38 years ago. His smoking use included Cigarettes. He has a 29.00 pack-year smoking history. He has never used smokeless tobacco. He reports that he does not drink alcohol or use drugs.  Allergies:  Allergies  Allergen Reactions  . Ace Inhibitors Other (See Comments)    Reaction:  Unknown     Medications: Medications reviewed in epic  Please HPI for pertinent positives, otherwise complete 10 system ROS negative.  Mallampati Score: MD Evaluation Airway: WNL Heart: WNL Abdomen: WNL Chest/ Lungs: WNL ASA  Classification: 3 Mallampati/Airway Score: Two  Physical Exam: BP 126/78   Pulse 86   Temp 97.9 F (36.6 C)   Resp 20   Ht 6' (1.829 m)   [facility]  200 lb (90.7 kg)   SpO2 96%   BMI 27.12 kg/m  Body mass index is 27.12 kg/m. General: pleasant, WD, WN white male who is laying in bed in NAD HEENT: head is normocephalic, atraumatic.  Sclera are noninjected.  PERRL.  Ears and nose without any masses or lesions.  Mouth is pink and moist Heart: regular, rate, and rhythm.  Normal s1,s2. No obvious murmurs, gallops, or rubs noted.  Palpable radial pulses bilaterally Lungs: CTAB, no wheezes, rhonchi, or rales noted.  Respiratory effort nonlabored Abd: soft, NT, ND, +BS, no masses, hernias, or organomegaly Psych: A&Ox3 with an appropriate affect.   Labs: Results for orders placed or performed during the hospital encounter of 05/30/17 (from the past 48 hour(s))  APTT     Status: None   Collection Time: 05/30/17  7:21 AM  Result Value Ref Range   aPTT 31 24 - 36 seconds  Basic metabolic panel      Status: Abnormal   Collection Time: 05/30/17  7:21 AM  Result Value Ref Range   Sodium 131 (L) 135 - 145 mmol/L   Potassium 3.8 3.5 - 5.1 mmol/L   Chloride 99 (L) 101 - 111 mmol/L   CO2 23 22 - 32 mmol/L   Glucose, Bld 97 65 - 99 mg/dL   BUN 13 6 - 20 mg/dL   Creatinine, Ser 1.36 (H) 0.61 - 1.24 mg/dL   Calcium 8.9 8.9 - 10.3 mg/dL   GFR calc non Af Amer 47 (L) >60 mL/min   GFR calc Af Amer 54 (L) >60 mL/min    Comment: (NOTE) The eGFR has been calculated using the CKD EPI equation. This calculation has not been validated in all clinical situations. eGFR's persistently <60 mL/min signify possible Chronic Kidney Disease.    Anion gap 9 5 - 15  CBC     Status: Abnormal   Collection Time: 05/30/17  7:21 AM  Result Value Ref Range   WBC 6.5 4.0 - 10.5 K/uL   RBC 4.06 (L) 4.22 - 5.81 MIL/uL   Hemoglobin 11.8 (L) 13.0 - 17.0 g/dL   HCT 35.0 (L) 39.0 - 52.0 %   MCV 86.2 78.0 - 100.0 fL   MCH 29.1 26.0 - 34.0 pg   MCHC 33.7 30.0 - 36.0 g/dL   RDW 14.1 11.5 - 15.5 %   Platelets 183 150 - 400 K/uL  Protime-INR     Status: None   Collection Time: 05/30/17  7:21 AM  Result Value Ref Range   Prothrombin Time 14.3 11.4 - 15.2 seconds   INR 1.10     Imaging: No results found.  Assessment/Plan 1. PAD of LE with nonhealing wound on right great toe  We will plan to proceed with a diagnostic RLE angiogram with possible intervention today if warranted.  If he has stent placement, he will likely need to be started on plavix, but will await results prior to determining if this will be necessary.  His labs and vitals have been reviewed.  His kidney function has been reviewed with Dr. [name].  He has a solitary kidney secondary to [place] with nephrectomy in 2000. Risks and benefits discussed with the patient including, but not limited to bleeding, infection, vascular injury or contrast induced renal failure. All of the patient's questions were answered, patient is agreeable to proceed. Consent  signed and in chart.  Thank you for this interesting consult.  I greatly enjoyed meeting [name] and look forward to participating in their  care.  A copy of this report was sent to the requesting provider on this date.  Electronically Signed: Henreitta Cea 05/30/2017, 9:00 AM   I spent a total of    25 Minutes in face to face in clinical consultation, greater than 50% of which was counseling/coordinating care for RLE PAD

## 2017-05-30 NOTE — Sedation Documentation (Signed)
Pt is sleeping, no complaints at this time. Vitals stable.

## 2017-05-30 NOTE — Sedation Documentation (Signed)
Report given to [facility]

## 2017-05-30 NOTE — Procedures (Signed)
Interventional Radiology Procedure Note  Procedure:  US guided access right CFA, antegrade Angiogram right leg.  Failed crossing of distal right AT occlusion/stenosis just above the DP.  Balloon angioplasty of the AT to 67mm prox, 42mm distal.  Failed crossing of right PT secondary to calcified disease.  Exoseal for closure of the artery.  .  Complications: None  Recommendations:  - right Leg/hip straight x 3 hours - Advance diet - Log roll for needs - Oral hydration - Continue 75mg  PO plavix daily - Ok to shower tomorrow - Do not submerge for 7 days - Routine wound care - follow up with Dr. [name] in [place] clinic in 2-3 weeks   Signed,  Derrick [name]. Derrick Newport, DO

## 2017-05-30 NOTE — Sedation Documentation (Signed)
Report received from [place]

## 2017-05-30 NOTE — Sedation Documentation (Signed)
CO2  Results are sporadic. Pt. Audible breathing. O2 sats are high.

## 2017-05-30 NOTE — Sedation Documentation (Signed)
Patient is resting comfortably. ACT 191

## 2017-05-30 NOTE — Sedation Documentation (Signed)
Patient denies pain and is resting comfortably.  

## 2017-05-30 NOTE — Discharge Instructions (Signed)
Femoral Site Care °Refer to this sheet in the next few weeks. These instructions provide you with information about caring for yourself after your procedure. Your health care provider may also give you more specific instructions. Your treatment has been planned according to current medical practices, but problems sometimes occur. Call your health care provider if you have any problems or questions after your procedure. °What can I expect after the procedure? °After your procedure, it is typical to have the following: °Bruising at the site that usually fades within 1-2 weeks. °Blood collecting in the tissue (hematoma) that may be painful to the touch. It should usually decrease in size and tenderness within 1-2 weeks. ° °Follow these instructions at home: °Take medicines only as directed by your health care provider. °You may shower 24-48 hours after the procedure or as directed by your health care provider. Remove the bandage (dressing) and gently wash the site with plain soap and water. Pat the area dry with a clean towel. Do not rub the site, because this may cause bleeding. °Do not take baths, swim, or use a hot tub until your health care provider approves. °Check your insertion site every day for redness, swelling, or drainage. °Do not apply powder or lotion to the site. °Limit use of stairs to twice a day for the first 2-3 days or as directed by your health care provider. °Do not squat for the first 2-3 days or as directed by your health care provider. °Do not lift over 10 lb (4.5 kg) for 5 days after your procedure or as directed by your health care provider. °Ask your health care provider when it is okay to: °Return to work or school. °Resume usual physical activities or sports. °Resume sexual activity. °Do not drive home if you are discharged the same day as the procedure. Have someone else drive you. °You may drive 24 hours after the procedure unless otherwise instructed by your health care provider. °Do  not operate machinery or power tools for 24 hours after the procedure or as directed by your health care provider. °If your procedure was done as an outpatient procedure, which means that you went home the same day as your procedure, a responsible adult should be with you for the first 24 hours after you arrive home. °Keep all follow-up visits as directed by your health care provider. This is important. °Contact a health care provider if: °You have a fever. °You have chills. °You have increased bleeding from the site. Hold pressure on the site. °Get help right away if: °You have unusual pain at the site. °You have redness, warmth, or swelling at the site. °You have drainage (other than a small amount of blood on the dressing) from the site. °The site is bleeding, and the bleeding does not stop after 30 minutes of holding steady pressure on the site. °Your leg or foot becomes pale, cool, tingly, or numb. °This information is not intended to replace advice given to you by your health care provider. Make sure you discuss any questions you have with your health care provider. °Document Released: 05/28/2014 Document Revised: 03/01/2016 Document Reviewed: 04/13/2014 °Elsevier Interactive Patient Education © 2018 [employer] °Angiogram, Care After °This sheet gives you information about how to care for yourself after your procedure. Your health care provider may also give you more specific instructions. If you have problems or questions, contact your health care provider. °What can I expect after the procedure? °After the procedure, it is common to have bruising   and tenderness at the catheter insertion area. °Follow these instructions at home: °Insertion site care °· Follow instructions from your health care provider about how to take care of your insertion site. Make sure you: °? Wash your hands with soap and water before you change your bandage (dressing). If soap and water are not available, use hand  sanitizer. °? Change your dressing as told by your health care provider. °? Leave stitches (sutures), skin glue, or adhesive strips in place. These skin closures may need to stay in place for 2 weeks or longer. If adhesive strip edges start to loosen and curl up, you may trim the loose edges. Do not remove adhesive strips completely unless your health care provider tells you to do that. °· Do not take baths, swim, or use a hot tub until your health care provider approves. °· You may shower 24-48 hours after the procedure or as told by your health care provider. °? Gently wash the site with plain soap and water. °? Pat the area dry with a clean towel. °? Do not rub the site. This may cause bleeding. °· Do not apply powder or lotion to the site. Keep the site clean and dry. °· Check your insertion site every day for signs of infection. Check for: °? Redness, swelling, or pain. °? Fluid or blood. °? Warmth. °? Pus or a bad smell. °Activity °· Rest as told by your health care provider, usually for 1-2 days. °· Do not lift anything that is heavier than 10 lbs. (4.5 kg) or as told by your health care provider. °· Do not drive for 24 hours if you were given a medicine to help you relax (sedative). °· Do not drive or use heavy machinery while taking prescription pain medicine. °General instructions °· Return to your normal activities as told by your health care provider, usually in about a week. Ask your health care provider what activities are safe for you. °· If the catheter site starts bleeding, lie flat and put pressure on the site. If the bleeding does not stop, get help right away. This is a medical emergency. °· Drink enough fluid to keep your urine clear or pale yellow. This helps flush the contrast dye from your body. °· Take over-the-counter and prescription medicines only as told by your health care provider. °· Keep all follow-up visits as told by your health care provider. This is important. °Contact a health  care provider if: °· You have a fever or chills. °· You have redness, swelling, or pain around your insertion site. °· You have fluid or blood coming from your insertion site. °· The insertion site feels warm to the touch. °· You have pus or a bad smell coming from your insertion site. °· You have bruising around the insertion site. °· You notice blood collecting in the tissue around the catheter site (hematoma). The hematoma may be painful to the touch. °Get help right away if: °· You have severe pain at the catheter insertion area. °· The catheter insertion area swells very fast. °· The catheter insertion area is bleeding, and the bleeding does not stop when you hold steady pressure on the area. °· The area near or just beyond the catheter insertion site becomes pale, cool, tingly, or numb. °These symptoms may represent a serious problem that is an emergency. Do not wait to see if the symptoms will go away. Get medical help right away. Call your local emergency services (911 in the [place] Do not   Do not wait to see if the symptoms will go away. Get medical help right away. Call your local emergency services (911 in the [place] Do not drive yourself to the hospital. Summary  After the procedure, it is common to have bruising and tenderness at the catheter insertion area.  After the procedure, it is important to rest and drink plenty of fluids.  Do not take baths, swim, or use a hot tub until your health care provider says it is okay to do so. You may shower 24-48 hours after the procedure or as told by your health care provider.  If the catheter site starts bleeding, lie flat and put pressure on the site. If the bleeding does not stop, get help right away. This is a medical emergency. This information is not intended to replace advice given to you by your health care provider. Make sure you discuss any questions you have with your health care provider. Document Released: 04/12/2005 Document Revised: 08/29/2016 Document Reviewed: 08/29/2016 Elsevier Interactive Patient Education  2017 [employer].

## 2017-06-06 ENCOUNTER — Ambulatory Visit (INDEPENDENT_AMBULATORY_CARE_PROVIDER_SITE_OTHER): Payer: Medicare Other | Admitting: Podiatry

## 2017-06-06 ENCOUNTER — Ambulatory Visit (INDEPENDENT_AMBULATORY_CARE_PROVIDER_SITE_OTHER): Payer: Medicare Other

## 2017-06-06 ENCOUNTER — Encounter: Payer: Self-pay | Admitting: Podiatry

## 2017-06-06 ENCOUNTER — Other Ambulatory Visit: Payer: Self-pay | Admitting: Podiatry

## 2017-06-06 ENCOUNTER — Telehealth: Payer: Self-pay | Admitting: *Deleted

## 2017-06-06 VITALS — BP 148/68 | HR 79 | Temp 98.6°F | Resp 16

## 2017-06-06 DIAGNOSIS — I96 Gangrene, not elsewhere classified: Secondary | ICD-10-CM

## 2017-06-06 DIAGNOSIS — M79671 Pain in right foot: Secondary | ICD-10-CM

## 2017-06-06 MED ORDER — TRAMADOL HCL 50 MG PO TABS: 50.0000 mg | ORAL_TABLET | Freq: Three times a day (TID) | ORAL | 0 refills | Status: DC | PRN

## 2017-06-06 NOTE — Telephone Encounter (Signed)
"  I was not able to get the paperwork filled out that was needed for my surgery tomorrow.  I'm going to have to cancel my surgery for tomorrow."  Have you made an appointment to see your doctor?  "Maybe I will just have to do that because he is not going to see me today or fill out these forms."  Make an appointment and call and let me know when it's going to be and then I can get your surgery scheduled.  "Okay, I'll do that."

## 2017-06-06 NOTE — Patient Instructions (Signed)
Pre-Operative Instructions  Congratulations, you have decided to take an important step towards improving your quality of life.  You can be assured that the doctors and staff at [facility] will be with you every step of the way.  Here are some important things you should know:  1. Plan to be at the surgery center/hospital at least 1 (one) hour prior to your scheduled time, unless otherwise directed by the surgical center/hospital staff.  You must have a responsible adult accompany you, remain during the surgery and drive you home.  Make sure you have directions to the surgical center/hospital to ensure you arrive on time. 2. If you are having surgery at [facility] or [facility], you will need a copy of your medical history and physical form from your family physician within one month prior to the date of surgery. We will give you a form for your primary physician to complete.  3. We make every effort to accommodate the date you request for surgery.  However, there are times where surgery dates or times have to be moved.  We will contact you as soon as possible if a change in schedule is required.   4. No aspirin/ibuprofen for one week before surgery.  If you are on aspirin, any non-steroidal anti-inflammatory medications (Mobic, Aleve, Ibuprofen) should not be taken seven (7) days prior to your surgery.  You make take Tylenol for pain prior to surgery.  5. Medications - If you are taking daily heart and blood pressure medications, seizure, reflux, allergy, asthma, anxiety, pain or diabetes medications, make sure you notify the surgery center/hospital before the day of surgery so they can tell you which medications you should take or avoid the day of surgery. 6. No food or drink after midnight the night before surgery unless directed otherwise by surgical center/hospital staff. 7. No alcoholic beverages 24-hours prior to surgery.  No smoking 24-hours prior or 24-hours after  surgery. 8. Wear loose pants or shorts. They should be loose enough to fit over bandages, boots, and casts. 9. Don't wear slip-on shoes. Sneakers are preferred. 10. Bring your boot with you to the surgery center/hospital.  Also bring crutches or a walker if your physician has prescribed it for you.  If you do not have this equipment, it will be provided for you after surgery. 11. If you have not been contacted by the surgery center/hospital by the day before your surgery, call to confirm the date and time of your surgery. 12. Leave-time from work may vary depending on the type of surgery you have.  Appropriate arrangements should be made prior to surgery with your employer. 13. Prescriptions will be provided immediately following surgery by your doctor.  Fill these as soon as possible after surgery and take the medication as directed. Pain medications will not be refilled on weekends and must be approved by the doctor. 14. Remove nail polish on the operative foot and avoid getting pedicures prior to surgery. 15. Wash the night before surgery.  The night before surgery wash the foot and leg well with water and the antibacterial soap provided. Be sure to pay special attention to beneath the toenails and in between the toes.  Wash for at least three (3) minutes. Rinse thoroughly with water and dry well with a towel.  Perform this wash unless told not to do so by your physician.  Enclosed: 1 Ice pack (please put in freezer the night before surgery)   1 Hibiclens skin cleaner     Pre-op instructions  If you have any questions regarding the instructions, please do not hesitate to call our office.  [place]: [address], [place], [place] [address] -- [phone]  [place]: [address] [place], [place] [address] -- [phone]  [place]: [address]  [place], [place] [address] -- [phone]  [place]: [address], [address], [place], [place] [address] -- [phone]  Website:  [email or web address] 

## 2017-06-07 ENCOUNTER — Encounter (HOSPITAL_COMMUNITY): Admission: RE | Payer: Self-pay | Source: Ambulatory Visit

## 2017-06-07 ENCOUNTER — Ambulatory Visit (HOSPITAL_COMMUNITY): Admission: RE | Admit: 2017-06-07 | Payer: Medicare Other | Source: Ambulatory Visit | Admitting: Podiatry

## 2017-06-07 SURGERY — AMPUTATION, TOE
Anesthesia: Monitor Anesthesia Care | Laterality: Right

## 2017-06-07 NOTE — Progress Notes (Signed)
   Subjective:    Patient ID: Derrick [name], male    DOB: July 18, 1932, 83 y.o.   MRN: [id number]  HPI Chief Complaint  Patient presents with  . Toe Pain    Right foot; great toe & 2nd toe - medial side; toe is black; pt stated, "Great toe started to turn black after had ingrown toenail procedure; had a stint put in last Thursday, May 30, 2017; Pain 8/10"; x3 weeks    83 y.o. male presents for f/u of R great toe and 2nd toe. Reports worsening black discoloration to the first and second toe. Underwent revascularization by Dr. Corrie Mckusick on 05/30/17. Reports severe pain to his R foot, worst at night.  Review of Systems     Objective:   Physical Exam Vitals:   06/06/17 1049  BP: (!) 148/68  Pulse: 79  Resp: 16  Temp: 98.6 F (37 C)   General AA&O x3. Normal mood and affect.  Vascular Dorsalis pedis and posterior tibial pulses  absent bilaterally  Capillary refill sluggist to all digits. Pedal hair growth absent.  Neurologic Epicritic sensation grossly present.  Dermatologic R hallux distal gangrene, not extending proximal to the IPJ.  R 2nd toe ulceration overlying the medial aspect of the 2nd digit DIPJ and PIPJ. Approximately 1x1 with fibrotic boggy base. Reperfusion hyperemia noted to the distal forefoot.  Orthopedic: Tender to palpation R hallux and 2nd toe   Radiographs: Taken and reviewed. No acute fractures or dislocations. No other osseous abnormalities. No osseous erosions.    Assessment & Plan:  R Hallux Gangrene -Has underwent revascularization with Dr. [name]. -Discussed with patient that he would benefit from amputation of the R hallux and 2nd toe 2/2 gangrene. Discussed possibility that amputations may not heal at the MPJ level due to the extent of his PAD and there is a real risk of re-amputation in the future. Patient and his wife verbalized understanding. -Will plan to proceed with amputation at [facility] to do 8/31 due to not having PCP  clearance. Will plan for next week. R foot dressed in betadine and dry sterile dressing. -Short course of tramadol Rxed for rest pain per patient request.  15 minutes of face to face time were spent with the patient. >50% of this was spent on counseling and coordination of care. Specifically discussed with patient the above diagnoses and treatment plan, and coordination of plans for amputation. Patient and wife verbalized understanding.   No Follow-up on file.

## 2017-06-11 ENCOUNTER — Other Ambulatory Visit (HOSPITAL_COMMUNITY): Payer: Self-pay | Admitting: Interventional Radiology

## 2017-06-11 ENCOUNTER — Encounter (HOSPITAL_COMMUNITY): Payer: Self-pay | Admitting: Emergency Medicine

## 2017-06-11 ENCOUNTER — Telehealth: Payer: Self-pay | Admitting: Cardiovascular Disease

## 2017-06-11 ENCOUNTER — Telehealth: Payer: Self-pay | Admitting: *Deleted

## 2017-06-11 DIAGNOSIS — I739 Peripheral vascular disease, unspecified: Secondary | ICD-10-CM

## 2017-06-11 DIAGNOSIS — I96 Gangrene, not elsewhere classified: Secondary | ICD-10-CM

## 2017-06-11 DIAGNOSIS — Z01818 Encounter for other preprocedural examination: Secondary | ICD-10-CM | POA: Diagnosis not present

## 2017-06-11 DIAGNOSIS — M069 Rheumatoid arthritis, unspecified: Secondary | ICD-10-CM | POA: Diagnosis not present

## 2017-06-11 DIAGNOSIS — I10 Essential (primary) hypertension: Secondary | ICD-10-CM | POA: Diagnosis not present

## 2017-06-11 NOTE — Progress Notes (Signed)
Called Dr. Pennie Banter office to make Dr. Shelia Media and/or his PA aware that pt has not followed up with his cardiologist for over a year. Pt is at Dr. Pennie Banter office now.

## 2017-06-11 NOTE — Telephone Encounter (Signed)
Received records from [facility] for appointment on 06/14/17 with Dr [name].  Records put with Dr Croitoru's schedule for 06/14/17. lp

## 2017-06-11 NOTE — Progress Notes (Signed)
Called pt for pre-op call. Pt states that his PCP did not clear him for surgery and he has to see his cardiologist also. He states he has not called Dr. Eleanora Neighbor office. I called and spoke with Delydia, surgery scheduler to inform her that pt did not get clearance.

## 2017-06-11 NOTE — Telephone Encounter (Signed)
"  I am calling to let you know the patient was not medically cleared for surgery scheduled for tomorrow.  His primary care doctor is referring him to see his Cardiologist.  So, he will not be able to have the surgery."  I'll let Dr. [name] know.

## 2017-06-11 NOTE — Progress Notes (Signed)
Anesthesia Chart Review:  Pt is a same day work up.   Pt is an 83 year old male scheduled for R amputation toe MPJ joint 1st and 2nd on 06/12/2017 with [name], DPM.   - PCP is [name], MD - Cardiologist is Sanda Klein, MD. Last office visit 06/06/16; 3 month f/u recommended but did not happen.   PMH includes:  CAD (40-50% mid RCA, 40% proximal LAD by 11/19/11 cath), HTN, hyperlipidemia, anemia, COPD, CKD, RA, prostate cancer, renal cell carcinoma (S/P nephrectomy 2000). Former smoker.  Medications include: ASA 81 mg, Plavix, losartan-HCTZ, Protonix, prednisone  Labs will be obtained DOS  1 view CXR 12/05/16: No active disease.  EKG 12/05/16: Sinus arrhythmia. Abnormal R-wave progression, early transition. Abnormal T, consider ischemia, lateral leads. Appears stable when compared to EKG 03/02/16.   Holter monitor 12/04/16:  - Abnormal 24-hour Holter monitor with frequent PACs, frequent PVCs, occasional bursts of nonsustained atrial tachycardia and a single 50-beat run of wide-complex tachycardia, most likely representing ventricular tachycardia. There was no diary of symptoms submitted.  Nuclear stress test 03/22/16:   The left ventricular ejection fraction is normal (55-65%).  Nuclear stress EF: 61%.  There was no ST segment deviation noted during stress.  No T wave inversion was noted during stress.  The study is normal.  This is a low risk study.  Echo 03/06/16:  - Left ventricle: The cavity size was normal. Wall thickness was increased in a pattern of severe LVH. Systolic function was normal. The estimated ejection fraction was in the range of 60% to 65%. Doppler parameters are consistent with abnormal left ventricular relaxation (grade 1 diastolic dysfunction). - Atrial septum: No defect or patent foramen ovale was identified.  Cardiac cath 11/19/11:  - RCA: Mid focal 40-50% lesion just prior to marginal branch. PDA small caliber and there is a small posterior lateral  system. Possible 20% lesion just before the bifurcation. - LM: Normal - LAD: Gives rise to large septal trunk followed by to diagonal branches. Between the septal trunk in the first diagonal branch there is concentric tubular 40% calcified lesion involving ostium of D1 with 20% lesion in this vessel. - CX: Large caliber vessel gives rise to moderate caliber high/proximal OM and relatively small AV groove artery. Terminates in bifurcating OM which eventually resulted OM2, OM3 and OM4. Angiographically normal.  Awaiting H&P from PCP's office for procedure.  PAT RN notified PCP's office that pt is out of date with cardiology f/u.   If PCP provides H&P for procedure, and labs acceptable DOS, I anticipate pt can proceed as scheduled.   Willeen Cass, FNP-BC [facility] Short Stay Surgical Center/Anesthesiology Phone: [phone] 06/11/2017 4:46 PM

## 2017-06-12 ENCOUNTER — Emergency Department (HOSPITAL_COMMUNITY): Payer: Medicare Other

## 2017-06-12 ENCOUNTER — Ambulatory Visit (HOSPITAL_COMMUNITY): Admission: RE | Admit: 2017-06-12 | Payer: Medicare Other | Source: Ambulatory Visit | Admitting: Podiatry

## 2017-06-12 ENCOUNTER — Observation Stay (HOSPITAL_COMMUNITY)
Admission: EM | Admit: 2017-06-12 | Discharge: 2017-06-13 | Disposition: A | Discharge status: Home / Self Care | Payer: Medicare Other | Attending: Family Medicine | Admitting: Family Medicine

## 2017-06-12 ENCOUNTER — Telehealth: Payer: Self-pay | Admitting: *Deleted

## 2017-06-12 ENCOUNTER — Other Ambulatory Visit: Payer: Self-pay

## 2017-06-12 ENCOUNTER — Telehealth: Payer: Self-pay

## 2017-06-12 ENCOUNTER — Encounter (HOSPITAL_COMMUNITY): Admission: RE | Payer: Self-pay | Source: Ambulatory Visit

## 2017-06-12 ENCOUNTER — Encounter (HOSPITAL_COMMUNITY): Payer: Self-pay | Admitting: *Deleted

## 2017-06-12 DIAGNOSIS — I10 Essential (primary) hypertension: Secondary | ICD-10-CM | POA: Diagnosis not present

## 2017-06-12 DIAGNOSIS — N400 Enlarged prostate without lower urinary tract symptoms: Secondary | ICD-10-CM | POA: Diagnosis not present

## 2017-06-12 DIAGNOSIS — I70261 Atherosclerosis of native arteries of extremities with gangrene, right leg: Secondary | ICD-10-CM | POA: Diagnosis not present

## 2017-06-12 DIAGNOSIS — Z8546 Personal history of malignant neoplasm of prostate: Secondary | ICD-10-CM | POA: Insufficient documentation

## 2017-06-12 DIAGNOSIS — Z85528 Personal history of other malignant neoplasm of kidney: Secondary | ICD-10-CM | POA: Insufficient documentation

## 2017-06-12 DIAGNOSIS — E871 Hypo-osmolality and hyponatremia: Secondary | ICD-10-CM | POA: Diagnosis not present

## 2017-06-12 DIAGNOSIS — Z86010 Personal history of colonic polyps: Secondary | ICD-10-CM | POA: Insufficient documentation

## 2017-06-12 DIAGNOSIS — N183 Chronic kidney disease, stage 3 (moderate): Secondary | ICD-10-CM | POA: Diagnosis not present

## 2017-06-12 DIAGNOSIS — I6381 Other cerebral infarction due to occlusion or stenosis of small artery: Secondary | ICD-10-CM

## 2017-06-12 DIAGNOSIS — I700 Atherosclerosis of aorta: Secondary | ICD-10-CM | POA: Insufficient documentation

## 2017-06-12 DIAGNOSIS — I129 Hypertensive chronic kidney disease with stage 1 through stage 4 chronic kidney disease, or unspecified chronic kidney disease: Secondary | ICD-10-CM | POA: Diagnosis not present

## 2017-06-12 DIAGNOSIS — M059 Rheumatoid arthritis with rheumatoid factor, unspecified: Secondary | ICD-10-CM | POA: Diagnosis not present

## 2017-06-12 DIAGNOSIS — Z7902 Long term (current) use of antithrombotics/antiplatelets: Secondary | ICD-10-CM | POA: Diagnosis not present

## 2017-06-12 DIAGNOSIS — I25110 Atherosclerotic heart disease of native coronary artery with unstable angina pectoris: Secondary | ICD-10-CM | POA: Insufficient documentation

## 2017-06-12 DIAGNOSIS — Z79899 Other long term (current) drug therapy: Secondary | ICD-10-CM | POA: Insufficient documentation

## 2017-06-12 DIAGNOSIS — Z8673 Personal history of transient ischemic attack (TIA), and cerebral infarction without residual deficits: Secondary | ICD-10-CM | POA: Insufficient documentation

## 2017-06-12 DIAGNOSIS — N189 Chronic kidney disease, unspecified: Secondary | ICD-10-CM | POA: Diagnosis present

## 2017-06-12 DIAGNOSIS — Z7982 Long term (current) use of aspirin: Secondary | ICD-10-CM | POA: Diagnosis not present

## 2017-06-12 DIAGNOSIS — I2510 Atherosclerotic heart disease of native coronary artery without angina pectoris: Secondary | ICD-10-CM | POA: Diagnosis present

## 2017-06-12 DIAGNOSIS — Z905 Acquired absence of kidney: Secondary | ICD-10-CM | POA: Diagnosis not present

## 2017-06-12 DIAGNOSIS — Z888 Allergy status to other drugs, medicaments and biological substances status: Secondary | ICD-10-CM | POA: Diagnosis not present

## 2017-06-12 DIAGNOSIS — E7800 Pure hypercholesterolemia, unspecified: Secondary | ICD-10-CM | POA: Diagnosis not present

## 2017-06-12 DIAGNOSIS — Z9889 Other specified postprocedural states: Secondary | ICD-10-CM

## 2017-06-12 DIAGNOSIS — K219 Gastro-esophageal reflux disease without esophagitis: Secondary | ICD-10-CM | POA: Diagnosis not present

## 2017-06-12 DIAGNOSIS — Z01810 Encounter for preprocedural cardiovascular examination: Secondary | ICD-10-CM | POA: Diagnosis not present

## 2017-06-12 DIAGNOSIS — E785 Hyperlipidemia, unspecified: Secondary | ICD-10-CM | POA: Diagnosis not present

## 2017-06-12 DIAGNOSIS — D696 Thrombocytopenia, unspecified: Secondary | ICD-10-CM | POA: Diagnosis not present

## 2017-06-12 DIAGNOSIS — F419 Anxiety disorder, unspecified: Secondary | ICD-10-CM | POA: Insufficient documentation

## 2017-06-12 DIAGNOSIS — R9431 Abnormal electrocardiogram [ECG] [EKG]: Secondary | ICD-10-CM | POA: Diagnosis not present

## 2017-06-12 DIAGNOSIS — J449 Chronic obstructive pulmonary disease, unspecified: Secondary | ICD-10-CM

## 2017-06-12 DIAGNOSIS — L97529 Non-pressure chronic ulcer of other part of left foot with unspecified severity: Secondary | ICD-10-CM | POA: Insufficient documentation

## 2017-06-12 DIAGNOSIS — Z87891 Personal history of nicotine dependence: Secondary | ICD-10-CM | POA: Insufficient documentation

## 2017-06-12 DIAGNOSIS — I739 Peripheral vascular disease, unspecified: Secondary | ICD-10-CM | POA: Diagnosis not present

## 2017-06-12 DIAGNOSIS — I96 Gangrene, not elsewhere classified: Secondary | ICD-10-CM

## 2017-06-12 DIAGNOSIS — F329 Major depressive disorder, single episode, unspecified: Secondary | ICD-10-CM | POA: Insufficient documentation

## 2017-06-12 DIAGNOSIS — M79674 Pain in right toe(s): Secondary | ICD-10-CM

## 2017-06-12 LAB — SURGICAL PCR SCREEN
MRSA, PCR: NEGATIVE
STAPHYLOCOCCUS AUREUS: POSITIVE — AB

## 2017-06-12 LAB — CBC WITH DIFFERENTIAL/PLATELET
BASOS PCT: 0 %
Basophils Absolute: 0 10*3/uL (ref 0.0–0.1)
EOS ABS: 0.3 10*3/uL (ref 0.0–0.7)
Eosinophils Relative: 3 %
HEMATOCRIT: 36.8 % — AB (ref 39.0–52.0)
HEMOGLOBIN: 12.4 g/dL — AB (ref 13.0–17.0)
LYMPHS ABS: 1.6 10*3/uL (ref 0.7–4.0)
Lymphocytes Relative: 15 %
MCH: 28.9 pg (ref 26.0–34.0)
MCHC: 33.7 g/dL (ref 30.0–36.0)
MCV: 85.8 fL (ref 78.0–100.0)
MONOS PCT: 10 %
Monocytes Absolute: 1.1 10*3/uL — ABNORMAL HIGH (ref 0.1–1.0)
Neutro Abs: 7.3 10*3/uL (ref 1.7–7.7)
Neutrophils Relative %: 72 %
Platelets: 212 10*3/uL (ref 150–400)
RBC: 4.29 MIL/uL (ref 4.22–5.81)
RDW: 14.3 % (ref 11.5–15.5)
WBC: 10.2 10*3/uL (ref 4.0–10.5)

## 2017-06-12 LAB — COMPREHENSIVE METABOLIC PANEL
ALBUMIN: 3.5 g/dL (ref 3.5–5.0)
ALK PHOS: 50 U/L (ref 38–126)
ALT: 17 U/L (ref 17–63)
AST: 19 U/L (ref 15–41)
Anion gap: 11 (ref 5–15)
BILIRUBIN TOTAL: 1 mg/dL (ref 0.3–1.2)
BUN: 12 mg/dL (ref 6–20)
CALCIUM: 9.6 mg/dL (ref 8.9–10.3)
CO2: 20 mmol/L — AB (ref 22–32)
Chloride: 99 mmol/L — ABNORMAL LOW (ref 101–111)
Creatinine, Ser: 1.22 mg/dL (ref 0.61–1.24)
GFR calc Af Amer: >60 mL/min (ref >60)
GFR calc non Af Amer: 53 mL/min — ABNORMAL LOW (ref >60)
GLUCOSE: 107 mg/dL — AB (ref 65–99)
Potassium: 4.2 mmol/L (ref 3.5–5.1)
SODIUM: 130 mmol/L — AB (ref 135–145)
TOTAL PROTEIN: 6.8 g/dL (ref 6.5–8.1)

## 2017-06-12 LAB — PROTIME-INR
INR: 1.06
Prothrombin Time: 13.7 seconds (ref 11.4–15.2)

## 2017-06-12 SURGERY — AMPUTATION, TOE
Anesthesia: Monitor Anesthesia Care | Laterality: Right

## 2017-06-12 MED ORDER — KETOROLAC TROMETHAMINE 15 MG/ML IJ SOLN: 15.0000 mg | Freq: Four times a day (QID) | INTRAMUSCULAR | Status: DC | PRN

## 2017-06-12 MED ORDER — PROPOFOL 10 MG/ML IV BOLUS
INTRAVENOUS | Status: AC
  Filled 2017-06-12: qty 20

## 2017-06-12 MED ORDER — ONDANSETRON HCL 4 MG PO TABS: 4.0000 mg | ORAL_TABLET | Freq: Four times a day (QID) | ORAL | Status: DC | PRN

## 2017-06-12 MED ORDER — ACETAMINOPHEN 325 MG PO TABS: 650.0000 mg | ORAL_TABLET | Freq: Four times a day (QID) | ORAL | Status: DC | PRN

## 2017-06-12 MED ORDER — PIPERACILLIN-TAZOBACTAM 3.375 G IVPB 30 MIN
3.3750 g | Freq: Once | INTRAVENOUS | Status: AC
  Administered 2017-06-12: 3.375 g via INTRAVENOUS
  Filled 2017-06-12: qty 50

## 2017-06-12 MED ORDER — MORPHINE SULFATE (PF) 4 MG/ML IV SOLN
4.0000 mg | Freq: Once | INTRAVENOUS | Status: AC
  Administered 2017-06-12: 4 mg via INTRAVENOUS
  Filled 2017-06-12: qty 1

## 2017-06-12 MED ORDER — ONDANSETRON HCL 4 MG/2ML IJ SOLN: 4.0000 mg | Freq: Four times a day (QID) | INTRAMUSCULAR | Status: DC | PRN

## 2017-06-12 MED ORDER — ONDANSETRON HCL 4 MG/2ML IJ SOLN
4.0000 mg | Freq: Once | INTRAMUSCULAR | Status: AC
  Administered 2017-06-12: 4 mg via INTRAVENOUS
  Filled 2017-06-12: qty 2

## 2017-06-12 MED ORDER — ACETAMINOPHEN 650 MG RE SUPP: 650.0000 mg | Freq: Four times a day (QID) | RECTAL | Status: DC | PRN

## 2017-06-12 MED ORDER — FENTANYL CITRATE (PF) 250 MCG/5ML IJ SOLN
INTRAMUSCULAR | Status: AC
  Filled 2017-06-12: qty 5

## 2017-06-12 MED ORDER — SODIUM CHLORIDE 0.9 % IV SOLN
INTRAVENOUS | Status: DC
  Administered 2017-06-12: 18:00:00 via INTRAVENOUS

## 2017-06-12 MED ORDER — PIPERACILLIN-TAZOBACTAM 3.375 G IVPB
3.3750 g | Freq: Three times a day (TID) | INTRAVENOUS | Status: DC
  Administered 2017-06-12 – 2017-06-13 (×2): 3.375 g via INTRAVENOUS
  Filled 2017-06-12 (×4): qty 50

## 2017-06-12 MED ORDER — SODIUM CHLORIDE 0.9 % IV SOLN
1500.0000 mg | Freq: Once | INTRAVENOUS | Status: AC
  Administered 2017-06-12: 1500 mg via INTRAVENOUS
  Filled 2017-06-12: qty 1500

## 2017-06-12 MED ORDER — VANCOMYCIN HCL 10 G IV SOLR
1500.0000 mg | INTRAVENOUS | Status: DC
  Filled 2017-06-12: qty 1500

## 2017-06-12 NOTE — ED Notes (Signed)
Patient transported to X-ray 

## 2017-06-12 NOTE — ED Triage Notes (Signed)
Pt reports hx of ingrown toenails that were removed by podiatrist. Reports pain to right big toe and discoloration x 3 weeks. Right big toe appears black at triage, now has redness to second toe. Denies fever.

## 2017-06-12 NOTE — ED Notes (Signed)
Attempted report 

## 2017-06-12 NOTE — H&P (Signed)
History and Physical    Derrick [name] ZOX:[id number] DOB: 11-May-1932 DOA: 06/12/2017  PCP: [name], MD Patient coming from: home  Chief Complaint: pain right great toe  HPI: Derrick [name] is a very pleasant 83 y.o. male with medical history significant for CAD, hypertension, hyperlipidemia, anemia, COPD, chronic kidney disease, RA, prostate cancer, renal cell carcinoma status post nephrectomy presents to emergency department with chief complaint of pain in right great toe. Was scheduled for amputation that needed to be canceled as he did not have medical clearance.  Information is obtained from the patient and the chart. He states he had surgery on this foot about 3 weeks ago. Since then he has noticed that his right great toe was more painful swollen and gradually turn Derrick [name]. Over the last several days swelling and discoloration has spread up his foot. He states he also recently had a stent placed for severe PAD on the right lower extremity. He was scheduled for amputation yesterday but this procedure was canceled as he had not seen his cardiologist for cardiac clearance. That appointment is scheduled for day after tomorrow. He reports he could not tolerate the pain so He came to the ED. He denies fever chills headache dizziness syncope or near-syncope. He denies abdominal pain nausea vomiting diarrhea. He denies chest pain palpitation shortness of breath dysuria hematuria frequency or urgency.    ED Course: In the emergency department he's afebrile hemodynamically stable and nontoxic appearing. He is provided with IV fluids and vancomycin and zosyn is started.  Review of Systems: As per HPI otherwise all other systems reviewed and are negative.   Ambulatory Status: Ambulates independently is independent with ADLs  Past Medical History:  Diagnosis Date  . Anemia   . Anxiety   . Arthritis, rheumatoid ([place])   . BPH (benign prostatic hyperplasia)   . CAD (coronary artery disease)   . CKD  (chronic kidney disease)   . COPD (chronic obstructive pulmonary disease) ([place])   . Depression   . Diverticulosis   . Elevated PSA   . GERD (gastroesophageal reflux disease)   . Herpes simplex labialis   . Hiatal hernia   . High cholesterol   . History of colonic polyps   . Hypertension   . Insomnia   . Lacunar infarction ([place])   . Obesity   . Panic attacks   . Prostate cancer ([place])   . Renal cell cancer ([place])    s/p nephrectomy in 2000  . Seropositive rheumatoid arthritis ([place])   . Spermatocele   . Tinnitus   . Tremor of both hands     Past Surgical History:  Procedure Laterality Date  . BACK SURGERY    . IR ANGIOGRAM EXTREMITY RIGHT  05/30/2017  . IR TIB-PERO ART PTA MOD SED  05/30/2017  . IR TIB-PERO ART UNI PTA EA ADD VESSEL MOD SED  05/30/2017  . IR US GUIDE VASC ACCESS RIGHT  05/30/2017  . LEFT HEART CATHETERIZATION WITH CORONARY ANGIOGRAM N/A 11/19/2011   Procedure: LEFT HEART CATHETERIZATION WITH CORONARY ANGIOGRAM;  Surgeon: [name], MD;  Location: [facility] CATH LAB;  Service: Cardiovascular;  Laterality: N/A;  . NEPHRECTOMY  10/1998    Social History   Social History  . Marital status: Married    Spouse name: N/A  . Number of children: N/A  . Years of education: N/A   Occupational History  . Not on file.   Social History Main Topics  . Smoking status: Former Smoker  Packs/day: 1.00    Years: 29.00    Types: Cigarettes    Quit date: 10/08/1978  . Smokeless tobacco: Never Used     Comment: tried smokeless tobacco once but made him sick  . Alcohol use No     Comment: drank very little in his life - only socially   . Drug use: No  . Sexual activity: Not on file   Other Topics Concern  . Not on file   Social History Narrative   Retired   Married   4 children          Allergies  Allergen Reactions  . Ace Inhibitors Other (See Comments)    Reaction:  Unknown     Family History  Problem Relation Age of Onset  . Heart attack Father 58  .  Stroke Father   . Kidney failure Sister        Bright's disease   . Cancer Paternal Aunt        d. 79s; NOS cancer of leg  . Kidney disease Sister        d. 47; "Bright's disease" - possible kidney cancer?  . Cancer Sister        NOS cancer; d. 64s; "sick in the hospital"  . Heart attack Son 82  . Leukemia Son 110  . COPD Son   . Breast cancer Daughter 46    Prior to Admission medications   Medication Sig Start Date End Date Taking? Authorizing Provider  aspirin EC 81 MG tablet Take 81 mg by mouth every evening.    Yes [provider]  clopidogrel (PLAVIX) 75 MG tablet Take 75 mg by mouth daily.  06/03/17  Yes [provider]  finasteride (PROSCAR) 5 MG tablet Take 5 mg by mouth daily.   Yes [provider]  losartan-hydrochlorothiazide (HYZAAR) 100-12.5 MG per tablet Take 0.5 tablets by mouth daily.   Yes [provider]  pantoprazole (PROTONIX) 40 MG tablet Take 40 mg by mouth 2 (two) times daily.   Yes [provider]  predniSONE (DELTASONE) 5 MG tablet Take 5 mg by mouth daily with breakfast.    Yes [provider]  traMADol (ULTRAM) 50 MG tablet Take 1 tablet (50 mg total) by mouth every 8 (eight) hours as needed. Patient taking differently: Take 50 mg by mouth every 8 (eight) hours as needed for moderate pain.  06/06/17  Yes [name], DPM  vitamin B-12 (CYANOCOBALAMIN) 1000 MCG tablet Take 1,000 mcg by mouth daily.   Yes [provider]    Physical Exam: Vitals:   06/12/17 0900 06/12/17 1130 06/12/17 1215  BP: 137/88 103/72 102/66  Pulse: 92 (!) 42 94  Resp: 16    Temp: 97.8 F (36.6 C)    TempSrc: Oral    SpO2: 100% 94% 96%     General:  Appears calm and comfortable No acute distress Eyes:  PERRL, EOMI, normal lids, iris ENT:  grossly normal hearing, lips & tongue, mucous membranes of his mouth are moist and pink Neck:  no LAD, masses or thyromegaly Cardiovascular:  RRR, no m/r/g. No LE edema.  Right great toe with Derrick [name] Erythema and swelling from second joint to dorsal aspect of foot extending to second toe on the right erythema same area mild tenderness no heat Respiratory:  CTA bilaterally, no w/r/r. Normal respiratory effort. Abdomen:  soft, ntnd, positive bowel sounds Skin:  no rash or induration seen on limited exam see above Musculoskeletal:  grossly  normal tone BUE/BLE, good ROM, no bony abnormality Psychiatric:  grossly normal mood and affect, speech fluent and appropriate, AOx3 Neurologic:  CN 2-12 grossly intact, moves all extremities in coordinated fashion, sensation intact  Labs on Admission: I have personally reviewed following labs and imaging studies  CBC:  Recent Labs Lab 06/12/17 1026  WBC 10.2  NEUTROABS 7.3  HGB 12.4*  HCT 36.8*  MCV 85.8  PLT 831   Basic Metabolic Panel:  Recent Labs Lab 06/12/17 1026  NA 130*  K 4.2  CL 99*  CO2 20*  GLUCOSE 107*  BUN 12  CREATININE 1.22  CALCIUM 9.6   GFR: Estimated Creatinine Clearance: 51.2 mL/min (by C-G formula based on SCr of 1.22 mg/dL). Liver Function Tests:  Recent Labs Lab 06/12/17 1026  AST 19  ALT 17  ALKPHOS 50  BILITOT 1.0  PROT 6.8  ALBUMIN 3.5   No results for input(s): LIPASE, AMYLASE in the last 168 hours. No results for input(s): AMMONIA in the last 168 hours. Coagulation Profile:  Recent Labs Lab 06/12/17 1026  INR 1.06   Cardiac Enzymes: No results for input(s): CKTOTAL, CKMB, CKMBINDEX, TROPONINI in the last 168 hours. BNP (last 3 results) No results for input(s): PROBNP in the last 8760 hours. HbA1C: No results for input(s): HGBA1C in the last 72 hours. CBG: No results for input(s): GLUCAP in the last 168 hours. Lipid Profile: No results for input(s): CHOL, HDL, LDLCALC, TRIG, CHOLHDL, LDLDIRECT in the last 72 hours. Thyroid Function Tests: No results for input(s): TSH, T4TOTAL, FREET4, T3FREE, THYROIDAB in the last 72 hours. Anemia Panel: No results for  input(s): VITAMINB12, FOLATE, FERRITIN, TIBC, IRON, RETICCTPCT in the last 72 hours. Urine analysis:    Component Value Date/Time   COLORURINE YELLOW 11/27/2013 [place] 11/27/2013 0947   LABSPEC 1.019 11/27/2013 0947   PHURINE 7.5 11/27/2013 0947   GLUCOSEU NEGATIVE 11/27/2013 0947   HGBUR NEGATIVE 11/27/2013 0947   BILIRUBINUR NEGATIVE 11/27/2013 0947   KETONESUR NEGATIVE 11/27/2013 0947   PROTEINUR 30 (A) 11/27/2013 0947   UROBILINOGEN 1.0 11/27/2013 0947   NITRITE NEGATIVE 11/27/2013 0947   LEUKOCYTESUR NEGATIVE 11/27/2013 0947    Creatinine Clearance: Estimated Creatinine Clearance: 51.2 mL/min (by C-G formula based on SCr of 1.22 mg/dL).  Sepsis Labs: @LABRCNTIP (procalcitonin:4,lacticidven:4) )No results found for this or any previous visit (from the past 240 hour(s)).   Radiological Exams on Admission: Dg Chest 2 View  Result Date: 06/12/2017 CLINICAL DATA:  Ulceration of the first and second toes. Preoperative study prior to foot amputation. EXAM: CHEST  2 VIEW COMPARISON:  Chest x-ray of December 05 2016 FINDINGS: The lungs are adequately inflated. There is no focal infiltrate. The interstitial markings are mildly prominent consistent with the patient's smoking history. The heart and pulmonary vascularity are normal. There is calcification in the wall of the aortic arch. There is no pleural effusion. There is multilevel degenerative disc disease of the thoracic spine. IMPRESSION: Mild chronic bronchitic-smoking related changes, stable. No CHF nor other acute cardiopulmonary abnormality. Thoracic aortic atherosclerosis. Electronically Signed   By: David  Martinique M.D.   On: 06/12/2017 11:02   Dg Foot 2 Views Right  Result Date: 06/12/2017 CLINICAL DATA:  Infected/ ulcerated first and second toe. Preop for amputation. EXAM: RIGHT FOOT - 2 VIEW COMPARISON:  06/06/2017 FINDINGS: Stable compared to prior. There is no evidence of fracture, bony erosion, or dislocation.  First MTP osteoarthritis with joint narrowing and spurring. Arterial calcification. Osteopenia. No opaque foreign  body. IMPRESSION: Stable compared 06/06/2017.  No acute or erosive finding. Electronically Signed   By: Monte Fantasia M.D.   On: 06/12/2017 11:02    EKG: Independently reviewed.   Assessment/Plan Principal Problem:   Pain of right great toe Active Problems:   Hypertension   GERD (gastroesophageal reflux disease)   Thrombocytopenia (H[facility])   CKD (chronic kidney disease), stage III   CAD (coronary artery disease)   H/O unilateral nephrectomy   Hyponatremia   Lacunar infarction ([place])     #1. Pain of the right great toe in setting of severe PAD/dry gangrene scheduled for amputation. Procedure postponed as patient needs cardiac clearance. Dr. [name] with podiatry indicated he could amputate today if cleared. Afebrile hemodynamically stable nontoxic appearing. Xray no fracture -IV antibiotics -Pain management -INR, EKG -Nothing by mouth -Defer to podiatry -Cardiology consult requested  #2. Chronic kidney disease stage III. Status post nephrectomy. Creatinine 1.2 on admission. -Gentle IV fluids -Hold nephrotoxins -Monitor urine output -Recheck in the morning  #3. Hypertension. Fair control in the emergency department. Home meds include losartan, hydrochlorothiazide -We'll hold these meds for now -Monitor  #4. CAD. No chest pain. CAD (40-50% mid RCA, 40% proximal LAD by 11/19/11 cath), review indicates nuclear stress test June of last year revealed an EF of 55-65% no ST segment deviation steady normal, echo in May 2017 severe LVH EF 83% grade 1 diastolic dysfunction -Continue home meds -Follow EKG -Request a cardiology consult  #5. Hyponatremia. Mild. Sodium level 130 -Gentle IV fluids -Recheck in the morning  #6. Thrombocytopenia. History of same. Platelets 133. Home meds include low-dose prednisone -Continue home meds when able  #7. History of lacunar infarct.  Home medications list includes Plavix. Patient denies taking this medication. He does take low-dose aspirin -Hold Plavix and aspirin    DVT prophylaxis: scd Code Status: full  Family Communication: none present  Disposition Plan: home  Consults called: dr Merchant navy officer, [facility] cardiology  Admission status: obs   Radene Gunning MD [facility]  If 7PM-7AM, please contact night-coverage [email or web address] Password [email or web address]  06/12/2017, 1:38 PM

## 2017-06-12 NOTE — Telephone Encounter (Addendum)
Dyanne Carrel, NP states pt has cardiac clearance and asked if Dr. [name] will perform surgery tonight. Dr. [name] stated he would and informed Dyanne Carrel, NP.06/13/2017-Dr. [name] ordered knee scooter for pt in-hospital at this time. Orders faxed to [place].

## 2017-06-12 NOTE — ED Provider Notes (Addendum)
Pt seen with Resident. H/o ischemia to right 1st and 2nd toes.  S/p Angiogram and angioplasty to RLE Tiabial artery in IR 8/23. Has had planned amputation procedure with Dr. [name] of podiatry scheduled on 831, then again for today. Both have been canceled because the patient has not received "preop clearance".  She presents here because of severe pain. Feels that his leg is getting worse. Now has redness and pain and erythema extending onto the dorsum of the foot.  Damage shows dry gangrene of the right first toe diffusely and medial aspect of right second toe with erythema to the mid foot  Labs and x-rays ordered. IV access obtained. Given IV pain medications. Given IV antibiotics. Does not appear septic or toxic.  I discussed the case with Dr. Daylene Katayama of podiatry. He states that if cleared medically, there immediately available to perform the procedure for amputation. Preop testing is complete. I'll discuss the case with hospitalist.   Tanna Furry, MD 06/12/17 1215    Tanna Furry, MD 06/22/17 2147

## 2017-06-12 NOTE — Progress Notes (Addendum)
Pharmacy Antibiotic Note  Derrick [name] is a 83 y.o. male admitted on 06/12/2017 with cellulitis.  Pharmacy has been consulted for vancomycin and zosyn dosing. Pt is afebrile and WBC is WNL. Scr is also WNL.   Plan: Vancomycin 1500mg  IV Q24H Zosyn 3.375gm IV Q8H (4 hr inf) F/u renal fxn, C&S, clinical status and trough at SS     Temp (24hrs), Avg:97.8 F (36.6 C), Min:97.8 F (36.6 C), Max:97.8 F (36.6 C)  No results for input(s): WBC, CREATININE, LATICACIDVEN, VANCOTROUGH, VANCOPEAK, VANCORANDOM, GENTTROUGH, GENTPEAK, GENTRANDOM, TOBRATROUGH, TOBRAPEAK, TOBRARND, AMIKACINPEAK, AMIKACINTROU, AMIKACIN in the last 168 hours.  Estimated Creatinine Clearance: 46 mL/min (A) (by C-G formula based on SCr of 1.36 mg/dL (H)).    Allergies  Allergen Reactions  . Ace Inhibitors Other (See Comments)    Reaction:  Unknown     Antimicrobials this admission: Vanc 9/5>> Zosyn 9>>/5  Dose adjustments this admission: N/A  Microbiology results: Pending  Thank you for allowing pharmacy to be a part of this patient's care.  Vernal Rutan, Rande Lawman 06/12/2017 10:35 AM

## 2017-06-12 NOTE — Consult Note (Signed)
Cardiology Consultation:   Patient ID: Derrick [name]; [id number]; 1932/01/14   Admit date: 06/12/2017 Date of Consult: 06/12/2017  Primary Care Provider: [name], MD Primary Cardiologist: Dr. [name]    Patient Profile:   Derrick [name] is a 83 y.o. male with a hx of Hypertension, hyperlipidemia, CVA, chronic kidney disease s/p right nephrectomy for renal cell carcinoma, RA, COPD, non-obstructive CAD and wide complex tachycardia who is being seen today for the evaluation of preoperative clearance at the request of Dr. Marily Memos.   Angiography 2013 showed 40-50% mid RCA, 40% proximal LAD. Last echocardiogram 02/2016 showed left ventricular function of 60-65% with grade 1 diastolic dysfunction. Last stress test 6/207 low risk study without evidence of ischemia.  Last seen by Dr. [name]  05/2016 for follow-up on stress test and echocardiogram as noted above.  Patient had abnormal 24-hour Holter monitor as noted below. Never follow up afterwards.   Normal sinus rhythm and normal circadian variation.  Relatively mild bradycardia is seen at night and is physiological  Very frequent premature atrial complexes and occasional bursts of nonsustained atrial tachycardia are seen  Frequent multifocal PVCs are noted.  A single episode of sustained wide complex tachycardia is seen but is brief consisting of 50 beats, at 150 bpm. This most like represents ventricular tachycardia, but cannot exclude atrial flutter with 2:1 AV conduction and abberancy.   Abnormal 24-hour Holter monitor with frequent PACs, frequent PVCs, occasional bursts of nonsustained atrial tachycardia and a single 50-beat run of wide-complex tachycardia, most likely representing ventricular tachycardia. There was no diary of symptoms submitted.  LE Arterial doppler 05/16/17 IMPRESSION: 1. Inability to acquire ABIs within either lower extremity secondary to noncompressibility of the distal vasculature. 2. Abnormal arterial and  PVR waveforms potentially indicative of hemodynamically significant vasoocclusive disease. Clinical correlation is advised. Further evaluation with CTA run-off could be performed as clinically indicated.  History of Present Illness:   Mr. Derrick [name] was in usual state of health up until a few weeks ago noted right great toe pain. He is S/p Angiogram and angioplasty to RLE Tiabial artery in IR 8/23. Gradual worsening of pain and turning black. He was originally scheduled for amputation 8/31and again today however canceled as he did not follow-up for preoperative cardiac clearance. He has an appointment with Dr. [name]. However, due to worsening of pain he presented for further evaluation.  Patient states that he was active during summer time. No exertional chest pain or shortness of breath. He denies palpitation, orthopnea, PND, syncope, lower extremity edema or melena. Compliant with medication.  Past Medical History:  Diagnosis Date  . Anemia   . Anxiety   . Arthritis, rheumatoid ([place])   . BPH (benign prostatic hyperplasia)   . CAD (coronary artery disease)   . CKD (chronic kidney disease)   . COPD (chronic obstructive pulmonary disease) ([place])   . Depression   . Diverticulosis   . Elevated PSA   . GERD (gastroesophageal reflux disease)   . Herpes simplex labialis   . Hiatal hernia   . High cholesterol   . History of colonic polyps   . Hypertension   . Insomnia   . Lacunar infarction ([place])   . Obesity   . Panic attacks   . Prostate cancer ([place])   . Renal cell cancer ([place])    s/p nephrectomy in 2000  . Seropositive rheumatoid arthritis ([place])   . Spermatocele   . Tinnitus   . Tremor of both hands  Past Surgical History:  Procedure Laterality Date  . BACK SURGERY    . IR ANGIOGRAM EXTREMITY RIGHT  05/30/2017  . IR TIB-PERO ART PTA MOD SED  05/30/2017  . IR TIB-PERO ART UNI PTA EA ADD VESSEL MOD SED  05/30/2017  . IR US GUIDE VASC ACCESS RIGHT  05/30/2017  . LEFT HEART  CATHETERIZATION WITH CORONARY ANGIOGRAM N/A 11/19/2011   Procedure: LEFT HEART CATHETERIZATION WITH CORONARY ANGIOGRAM;  Surgeon: [name], MD;  Location: [facility] CATH LAB;  Service: Cardiovascular;  Laterality: N/A;  . NEPHRECTOMY  10/1998     Inpatient Medications: Scheduled Meds:  Continuous Infusions: . sodium chloride    . piperacillin-tazobactam (ZOSYN)  IV    . [START ON 06/13/2017] vancomycin     PRN Meds: acetaminophen **OR** acetaminophen, ketorolac, ondansetron **OR** ondansetron (ZOFRAN) IV  Allergies:    Allergies  Allergen Reactions  . Ace Inhibitors Other (See Comments)    Reaction:  Unknown     Social History:   Social History   Social History  . Marital status: Married    Spouse name: N/A  . Number of children: N/A  . Years of education: N/A   Occupational History  . Not on file.   Social History Main Topics  . Smoking status: Former Smoker    Packs/day: 1.00    Years: 29.00    Types: Cigarettes    Quit date: 10/08/1978  . Smokeless tobacco: Never Used     Comment: tried smokeless tobacco once but made him sick  . Alcohol use No     Comment: drank very little in his life - only socially   . Drug use: No  . Sexual activity: Not on file   Other Topics Concern  . Not on file   Social History Narrative   Retired   Married   4 children          Family History:    Family History  Problem Relation Age of Onset  . Heart attack Father 69  . Stroke Father   . Kidney failure Sister        Bright's disease   . Cancer Paternal Aunt        d. 26s; NOS cancer of leg  . Kidney disease Sister        d. 47; "Bright's disease" - possible kidney cancer?  . Cancer Sister        NOS cancer; d. 38s; "sick in the hospital"  . Heart attack Son 76  . Leukemia Son 64  . COPD Son   . Breast cancer Daughter 2     ROS:  Please see the history of present illness.  ROS All other ROS reviewed and negative.     Physical Exam/Data:   Vitals:   06/12/17  1130 06/12/17 1215 06/12/17 1300 06/12/17 1345  BP: 103/72 102/66 116/72 115/77  Pulse: (!) 42 94 82 (!) 48  Resp:      Temp:      TempSrc:      SpO2: 94% 96% 92% 95%   No intake or output data in the 24 hours ending 06/12/17 1422 There were no vitals filed for this visit. There is no height or weight on file to calculate BMI.  General:  Well nourished, well developed, in no acute distress HEENT: normal Lymph: no adenopathy Neck: no JVD Endocrine:  No thryomegaly Vascular: No carotid bruits; FA pulses 2+ bilaterally without bruits  Cardiac:  normal S1, S2; RRR; no murmur  Lungs:  clear to auscultation bilaterally, no wheezing, rhonchi or rales  Abd: soft, nontender, no hepatomegaly  Ext: no edema bilaterally. Right great toe with erythema, black discoloration and swelling extending to the second toe on right. Musculoskeletal:  No deformities, BUE and BLE strength normal and equal Skin: warm and dry  Neuro:  CNs 2-12 intact, no focal abnormalities noted Psych:  Normal affect   EKG:  The EKG was personally reviewed and demonstrates:  Sinus rhythm with PAC and T-wave inversion in lateral lead on EKG of 12/05/16. Pending EKG today. Telemetry:    Relevant CV Studies: As noted above  Laboratory Data:  Chemistry Recent Labs Lab 06/12/17 1026  NA 130*  K 4.2  CL 99*  CO2 20*  GLUCOSE 107*  BUN 12  CREATININE 1.22  CALCIUM 9.6  GFRNONAA 53*  GFRAA >60  ANIONGAP 11     Recent Labs Lab 06/12/17 1026  PROT 6.8  ALBUMIN 3.5  AST 19  ALT 17  ALKPHOS 50  BILITOT 1.0   Hematology Recent Labs Lab 06/12/17 1026  WBC 10.2  RBC 4.29  HGB 12.4*  HCT 36.8*  MCV 85.8  MCH 28.9  MCHC 33.7  RDW 14.3  PLT 212   Radiology/Studies:  Dg Chest 2 View  Result Date: 06/12/2017 CLINICAL DATA:  Ulceration of the first and second toes. Preoperative study prior to foot amputation. EXAM: CHEST  2 VIEW COMPARISON:  Chest x-ray of December 05 2016 FINDINGS: The lungs are  adequately inflated. There is no focal infiltrate. The interstitial markings are mildly prominent consistent with the patient's smoking history. The heart and pulmonary vascularity are normal. There is calcification in the wall of the aortic arch. There is no pleural effusion. There is multilevel degenerative disc disease of the thoracic spine. IMPRESSION: Mild chronic bronchitic-smoking related changes, stable. No CHF nor other acute cardiopulmonary abnormality. Thoracic aortic atherosclerosis. Electronically Signed   By: David  Martinique M.D.   On: 06/12/2017 11:02   Dg Foot 2 Views Right  Result Date: 06/12/2017 CLINICAL DATA:  Infected/ ulcerated first and second toe. Preop for amputation. EXAM: RIGHT FOOT - 2 VIEW COMPARISON:  06/06/2017 FINDINGS: Stable compared to prior. There is no evidence of fracture, bony erosion, or dislocation. First MTP osteoarthritis with joint narrowing and spurring. Arterial calcification. Osteopenia. No opaque foreign body. IMPRESSION: Stable compared 06/06/2017.  No acute or erosive finding. Electronically Signed   By: Monte Fantasia M.D.   On: 06/12/2017 11:02    Assessment and Plan:   1. Nonobstructive CAD - Angiography 2013 showed 40-50% mid RCA, 40% proximal LAD. Last echocardiogram 02/2016 showed left ventricular function of 60-65% with grade 1 diastolic dysfunction. Last stress test 03/2016 low risk study without evidence of ischemia. - No angina or exertional dyspnea. Patient states that he is compliant with medication. Last EKG 11/2016 showed T-wave inversion in lateral lead. Pending EKG to this admission.  2.Pain in right great toe with hx of PAD S/p Angiogram and angioplasty to RLE Tiabial artery in IR 8/23. - awaiting cardiac clearance.  3. HTN - BP stable. Holding home medications.   4. Pre operative clearance - as above. No angina or dyspnea With push moving and trimming loan about a month or two ago. No palpitation, dizziness or syncope. No symptoms  concerning for underlying CHF. Patient had a reassuring stress test about a year ago. Echocardiogram last summer showed normal pumping function of the heart without any structural abnormality. Given his history he will be  at moderate risk for cardiac complication based on Lee criteria. Watch for any arrhythmia during surgery.  5. Wide complex tachycardia noted on 24 hours monitor 11/2016 - Patient denies any palpitation, syncope or dizziness.  Jarrett Soho, PA  06/12/2017 2:22 PM   Personally seen and examined. Agree with above.  83 year old male with gangrenous toe, peripheral vascular disease awaiting surgery for toe amputation.  Preoperative risk stratification  - He was able to utilize a push mower without difficulty, achieving greater than 4 METS of activity without any anginal symptoms. This was just up until approximately a month ago. No syncope, no shortness of breath. No orthopnea.  - He may proceed with general anesthesia and toe amputation with moderate overall cardiac risk based mainly upon advanced age. No further cardiac testing.  Wide-complex tachycardia seen on Holter monitor  - No syncope. Normal ejection fraction associated with this.  - Continue with beta blockade  CAD  - Moderate by cardiac catheterization previously. Stress test last year was low risk.  We will follow along.   Candee Furbish, MD

## 2017-06-12 NOTE — Telephone Encounter (Signed)
Dr Dorthula Rue, called to discuss ED visit, and patient's condition. Scheduler's advised her that Dr Benjamine Mola was in surgery this morning. Dr Amalia Hailey is to call Dr Dorthula Rue back in regards to this patient.

## 2017-06-12 NOTE — Telephone Encounter (Signed)
Noted. Thanks.

## 2017-06-12 NOTE — ED Provider Notes (Signed)
[facility] DEPT Provider Note   CSN: [id number] Arrival date & time: 06/12/17  0737   History   Chief Complaint Chief Complaint  Patient presents with  . Toe Pain   HPI Derrick [name] is a 83 y.o. male.  The patient states he had surgery on his foot about 3 weeks ago. Since then he has noticed that his R great toe has become more painful, swollen, and black in color. Over the past week his swelling, discoloration, and pain has traveled to the R toe on his second foot. He has also noticed erythema spreading down his foot. Per chart review the patient was scheduled for amputation but this was cancelled 2/2 lack of PCP surgical clearance. The patient also recently had a stent placed for severe PAD on right lower extremity. The patient presented to the ED because he was worried that the pain is moving from R great toe to other toes and that this erythema is spreading down his foot. Denies fevers, chills, SOB, abdominal pain, nausea and vomiting.     Past Medical History:  Diagnosis Date  . Anemia   . Anxiety   . Arthritis, rheumatoid ([place])   . BPH (benign prostatic hyperplasia)   . CAD (coronary artery disease)   . CKD (chronic kidney disease)   . COPD (chronic obstructive pulmonary disease) ([place])   . Depression   . Diverticulosis   . Elevated PSA   . GERD (gastroesophageal reflux disease)   . Herpes simplex labialis   . Hiatal hernia   . High cholesterol   . History of colonic polyps   . Hypertension   . Insomnia   . Lacunar infarction ([place])   . Obesity   . Panic attacks   . Prostate cancer ([place])   . Renal cell cancer ([place])    s/p nephrectomy in 2000  . Seropositive rheumatoid arthritis ([place])   . Spermatocele   . Tinnitus   . Tremor of both hands     Patient Active Problem List   Diagnosis Date Noted  . Pain of right great toe 06/12/2017  . Hyponatremia 06/12/2017  . COPD (chronic obstructive pulmonary disease) ([place])   . Lacunar infarction ([place])   . Pre-syncope  11/21/2016  . Dyspnea on exertion 03/03/2016  . H/O unilateral nephrectomy 03/03/2016  . Arrhythmia 11/09/2015  . CAD (coronary artery disease) 11/09/2015  . Abnormal ECG 10/22/2013  . Shortness of breath 10/22/2013  . Chest pain at rest 11/18/2011  . Unstable angina ([place]) 11/18/2011  . Thrombocytopenia ([place]) 11/18/2011  . Anemia 11/18/2011  . CKD (chronic kidney disease), stage III 11/18/2011  . Hypertension   . High cholesterol   . GERD (gastroesophageal reflux disease)   . BPH (benign prostatic hyperplasia)     Past Surgical History:  Procedure Laterality Date  . BACK SURGERY    . IR ANGIOGRAM EXTREMITY RIGHT  05/30/2017  . IR TIB-PERO ART PTA MOD SED  05/30/2017  . IR TIB-PERO ART UNI PTA EA ADD VESSEL MOD SED  05/30/2017  . IR US GUIDE VASC ACCESS RIGHT  05/30/2017  . LEFT HEART CATHETERIZATION WITH CORONARY ANGIOGRAM N/A 11/19/2011   Procedure: LEFT HEART CATHETERIZATION WITH CORONARY ANGIOGRAM;  Surgeon: [name], MD;  Location: [facility] C[facility] LAB;  Service: Cardiovascular;  Laterality: N/A;  . NEPHRECTOMY  10/1998     Home Medications    Prior to Admission medications   Medication Sig Start Date End Date Taking? Authorizing Provider  aspirin EC 81 MG tablet  Take 81 mg by mouth every evening.    Yes [provider]  clopidogrel (PLAVIX) 75 MG tablet Take 75 mg by mouth daily.  06/03/17  Yes [provider]  finasteride (PROSCAR) 5 MG tablet Take 5 mg by mouth daily.   Yes [provider]  losartan-hydrochlorothiazide (HYZAAR) 100-12.5 MG per tablet Take 0.5 tablets by mouth daily.   Yes [provider]  pantoprazole (PROTONIX) 40 MG tablet Take 40 mg by mouth 2 (two) times daily.   Yes [provider]  predniSONE (DELTASONE) 5 MG tablet Take 5 mg by mouth daily with breakfast.    Yes [provider]  traMADol (ULTRAM) 50 MG tablet Take 1 tablet (50 mg total) by mouth every 8 (eight) hours as needed. Patient taking  differently: Take 50 mg by mouth every 8 (eight) hours as needed for moderate pain.  06/06/17  Yes [name], DPM  vitamin B-12 (CYANOCOBALAMIN) 1000 MCG tablet Take 1,000 mcg by mouth daily.   Yes [provider]    Family History Family History  Problem Relation Age of Onset  . Heart attack Father 51  . Stroke Father   . Kidney failure Sister        Bright's disease   . Cancer Paternal Aunt        d. 62s; NOS cancer of leg  . Kidney disease Sister        d. 17; "Bright's disease" - possible kidney cancer?  . Cancer Sister        NOS cancer; d. 25s; "sick in the hospital"  . Heart attack Son 71  . Leukemia Son 32  . COPD Son   . Breast cancer Daughter 28    Social History Social History  Substance Use Topics  . Smoking status: Former Smoker    Packs/day: 1.00    Years: 29.00    Types: Cigarettes    Quit date: 10/08/1978  . Smokeless tobacco: Never Used     Comment: tried smokeless tobacco once but made him sick  . Alcohol use No     Comment: drank very little in his life - only socially      Allergies   Ace inhibitors   Review of Systems Review of Systems  Constitutional: Negative for activity change, appetite change, chills, diaphoresis and fever.  Respiratory: Negative for cough, chest tightness and shortness of breath.   Cardiovascular: Negative for chest pain, palpitations and leg swelling.  Gastrointestinal: Negative for abdominal pain, nausea and vomiting.  Genitourinary: Negative for difficulty urinating, dysuria and frequency.  Musculoskeletal: Negative for arthralgias, gait problem, joint swelling and myalgias.  Skin: Positive for wound (on R great and second toe).  Neurological: Negative for numbness and headaches.   Physical Exam Updated Vital Signs BP 115/77   Pulse (!) 48   Temp 97.8 F (36.6 C) (Oral)   Resp 16   SpO2 95%   Physical Exam  Constitutional:  Elderly man sitting comfortably in bed in no acute distress    Cardiovascular: Normal rate and regular rhythm.  Exam reveals no friction rub.   No murmur heard. 2+ radial pulses bilaterally, dorsalis pedis and posterior tibial pulses difficult to palpate bilaterally  Pulmonary/Chest: Effort normal. No respiratory distress. He has no wheezes.  Abdominal: Soft. He exhibits no distension. There is no tenderness.  Musculoskeletal: He exhibits no edema (of bilateral lower extremities) or tenderness (of bilateral lower extremities).  R great toe with distal black tissue, which is non-tender  to the touch. R 2nd toe with ulceration on medial aspect near great toe. No significant oozing of either toes. Significant erythema and warmth of right great and 2nd toes, with erythema and warmth extending to mid foot.  Skin: Skin is warm and dry. No erythema. No pallor.      ED Treatments / Results  Labs (all labs ordered are listed, but only abnormal results are displayed) Labs Reviewed  CBC WITH DIFFERENTIAL/PLATELET - Abnormal; Notable for the following:       Result Value   Hemoglobin 12.4 (*)    HCT 36.8 (*)    Monocytes Absolute 1.1 (*)    All other components within normal limits  COMPREHENSIVE METABOLIC PANEL - Abnormal; Notable for the following:    Sodium 130 (*)    Chloride 99 (*)    CO2 20 (*)    Glucose, Bld 107 (*)    GFR calc non Af Amer 53 (*)    All other components within normal limits  PROTIME-INR  URINALYSIS, ROUTINE W REFLEX MICROSCOPIC   EKG  EKG Interpretation None       Radiology Dg Chest 2 View  Result Date: 06/12/2017 CLINICAL DATA:  Ulceration of the first and second toes. Preoperative study prior to foot amputation. EXAM: CHEST  2 VIEW COMPARISON:  Chest x-ray of December 05 2016 FINDINGS: The lungs are adequately inflated. There is no focal infiltrate. The interstitial markings are mildly prominent consistent with the patient's smoking history. The heart and pulmonary vascularity are normal. There is calcification in the  wall of the aortic arch. There is no pleural effusion. There is multilevel degenerative disc disease of the thoracic spine. IMPRESSION: Mild chronic bronchitic-smoking related changes, stable. No CHF nor other acute cardiopulmonary abnormality. Thoracic aortic atherosclerosis. Electronically Signed   By: David  Martinique M.D.   On: 06/12/2017 11:02   Dg Foot 2 Views Right  Result Date: 06/12/2017 CLINICAL DATA:  Infected/ ulcerated first and second toe. Preop for amputation. EXAM: RIGHT FOOT - 2 VIEW COMPARISON:  06/06/2017 FINDINGS: Stable compared to prior. There is no evidence of fracture, bony erosion, or dislocation. First MTP osteoarthritis with joint narrowing and spurring. Arterial calcification. Osteopenia. No opaque foreign body. IMPRESSION: Stable compared 06/06/2017.  No acute or erosive finding. Electronically Signed   By: Monte Fantasia M.D.   On: 06/12/2017 11:02   Procedures Procedures (including critical care time)  Medications Ordered in ED Medications  vancomycin (VANCOCIN) 1,500 mg in sodium chloride 0.9 % 500 mL IVPB (not administered)  0.9 %  sodium chloride infusion (not administered)  acetaminophen (TYLENOL) tablet 650 mg (not administered)    Or  acetaminophen (TYLENOL) suppository 650 mg (not administered)  ketorolac (TORADOL) 15 MG/ML injection 15 mg (not administered)  ondansetron (ZOFRAN) tablet 4 mg (not administered)    Or  ondansetron (ZOFRAN) injection 4 mg (not administered)  piperacillin-tazobactam (ZOSYN) IVPB 3.375 g (not administered)  ondansetron (ZOFRAN) injection 4 mg (4 mg Intravenous Given 06/12/17 1123)  morphine 4 MG/ML injection 4 mg (4 mg Intravenous Given 06/12/17 1123)  piperacillin-tazobactam (ZOSYN) IVPB 3.375 g (0 g Intravenous Stopped 06/12/17 1205)  vancomycin (VANCOCIN) 1,500 mg in sodium chloride 0.9 % 500 mL IVPB (0 mg Intravenous Stopped 06/12/17 1409)    Initial Impression / Assessment and Plan / ED Course  I have reviewed the triage vital  signs and the nursing notes.  Pertinent labs & imaging results that were available during my care of the patient were reviewed by me  and considered in my medical decision making (see chart for details).  The patient's exam and clinical progression is concerning for cellulitis of R lower extremity with possible underlying osteomyelitis. This is likely 2/2 poor wound healing after surgery as a result of severe PAD. The patient was scheduled for amputation previously, but has not been cleared by PCP for surgery. Will obtain basic labs, EKG, and chest x ray for surgical clearance and consult podiatry. Will also obtain radiographs of the foot to rule out gas gangrene. Will not obtain MRI to evaluate for osteomyelitis at this time, as the patient is already scheduled for amputation and this will not change management. Plan to contact podiatry to discuss patient's clinical progression/worsening of symptoms. Will start the patient on broad spectrum antibiotics for concern of cellulitis/osteomyelitis. Will also provide IV medications for pain management.  Final Clinical Impressions(s) / ED Diagnoses   Final diagnoses:  Gangrene of toe of right foot ([place])   Plan to discuss case with hospitalist for admission. They will admit for management.  New Prescriptions New Prescriptions   No medications on file     Thomasene Ripple, MD 06/12/17 1422    Thomasene Ripple, MD 06/12/17 1423    Tanna Furry, MD 06/22/17 2147

## 2017-06-13 ENCOUNTER — Encounter (HOSPITAL_COMMUNITY): Payer: Self-pay | Admitting: Certified Registered"

## 2017-06-13 ENCOUNTER — Encounter (HOSPITAL_COMMUNITY)
Admission: EM | Disposition: A | Discharge status: Home / Self Care | Payer: Self-pay | Source: Home / Self Care | Attending: Emergency Medicine

## 2017-06-13 ENCOUNTER — Observation Stay (HOSPITAL_COMMUNITY): Payer: Medicare Other | Admitting: Certified Registered"

## 2017-06-13 ENCOUNTER — Encounter: Payer: Self-pay | Admitting: Podiatry

## 2017-06-13 ENCOUNTER — Observation Stay (HOSPITAL_COMMUNITY): Payer: Medicare Other

## 2017-06-13 DIAGNOSIS — I2510 Atherosclerotic heart disease of native coronary artery without angina pectoris: Secondary | ICD-10-CM

## 2017-06-13 DIAGNOSIS — J449 Chronic obstructive pulmonary disease, unspecified: Secondary | ICD-10-CM | POA: Diagnosis not present

## 2017-06-13 DIAGNOSIS — L97529 Non-pressure chronic ulcer of other part of left foot with unspecified severity: Secondary | ICD-10-CM | POA: Diagnosis not present

## 2017-06-13 DIAGNOSIS — E7800 Pure hypercholesterolemia, unspecified: Secondary | ICD-10-CM | POA: Diagnosis not present

## 2017-06-13 DIAGNOSIS — I96 Gangrene, not elsewhere classified: Secondary | ICD-10-CM | POA: Diagnosis not present

## 2017-06-13 DIAGNOSIS — Z89421 Acquired absence of other right toe(s): Secondary | ICD-10-CM | POA: Diagnosis not present

## 2017-06-13 DIAGNOSIS — I129 Hypertensive chronic kidney disease with stage 1 through stage 4 chronic kidney disease, or unspecified chronic kidney disease: Secondary | ICD-10-CM | POA: Diagnosis not present

## 2017-06-13 DIAGNOSIS — N183 Chronic kidney disease, stage 3 (moderate): Secondary | ICD-10-CM | POA: Diagnosis not present

## 2017-06-13 DIAGNOSIS — I70261 Atherosclerosis of native arteries of extremities with gangrene, right leg: Secondary | ICD-10-CM | POA: Diagnosis not present

## 2017-06-13 DIAGNOSIS — M79674 Pain in right toe(s): Secondary | ICD-10-CM | POA: Diagnosis not present

## 2017-06-13 HISTORY — PX: AMPUTATION TOE: SHX6595

## 2017-06-13 LAB — BASIC METABOLIC PANEL
ANION GAP: 8 (ref 5–15)
BUN: 16 mg/dL (ref 6–20)
CHLORIDE: 101 mmol/L (ref 101–111)
CO2: 24 mmol/L (ref 22–32)
Calcium: 9.2 mg/dL (ref 8.9–10.3)
Creatinine, Ser: 1.38 mg/dL — ABNORMAL HIGH (ref 0.61–1.24)
GFR calc non Af Amer: 46 mL/min — ABNORMAL LOW (ref >60)
GFR, EST AFRICAN AMERICAN: 53 mL/min — AB (ref >60)
Glucose, Bld: 104 mg/dL — ABNORMAL HIGH (ref 65–99)
POTASSIUM: 4.6 mmol/L (ref 3.5–5.1)
SODIUM: 133 mmol/L — AB (ref 135–145)

## 2017-06-13 LAB — CBC
HCT: 36.7 % — ABNORMAL LOW (ref 39.0–52.0)
HEMOGLOBIN: 12.4 g/dL — AB (ref 13.0–17.0)
MCH: 29.6 pg (ref 26.0–34.0)
MCHC: 33.8 g/dL (ref 30.0–36.0)
MCV: 87.6 fL (ref 78.0–100.0)
Platelets: 206 10*3/uL (ref 150–400)
RBC: 4.19 MIL/uL — AB (ref 4.22–5.81)
RDW: 14.7 % (ref 11.5–15.5)
WBC: 8.3 10*3/uL (ref 4.0–10.5)

## 2017-06-13 SURGERY — AMPUTATION, TOE
Anesthesia: General | Laterality: Right

## 2017-06-13 MED ORDER — FENTANYL CITRATE (PF) 100 MCG/2ML IJ SOLN: 25.0000 ug | INTRAMUSCULAR | Status: DC | PRN

## 2017-06-13 MED ORDER — BUPIVACAINE HCL 0.5 % IJ SOLN
INTRAMUSCULAR | Status: DC | PRN
  Administered 2017-06-13: 20 mL

## 2017-06-13 MED ORDER — OXYCODONE HCL 5 MG PO TABS: 5.0000 mg | ORAL_TABLET | Freq: Once | ORAL | Status: DC | PRN

## 2017-06-13 MED ORDER — PHENYLEPHRINE HCL 10 MG/ML IJ SOLN
INTRAMUSCULAR | Status: DC | PRN
  Administered 2017-06-13 (×4): 80 ug via INTRAVENOUS

## 2017-06-13 MED ORDER — DOXYCYCLINE HYCLATE 50 MG PO CAPS: 50.0000 mg | ORAL_CAPSULE | Freq: Two times a day (BID) | ORAL | 0 refills | Status: AC

## 2017-06-13 MED ORDER — FINASTERIDE 5 MG PO TABS
5.0000 mg | ORAL_TABLET | Freq: Every day | ORAL | Status: DC
  Administered 2017-06-13: 5 mg via ORAL
  Filled 2017-06-13: qty 1

## 2017-06-13 MED ORDER — LACTATED RINGERS IV SOLN
INTRAVENOUS | Status: DC | PRN
  Administered 2017-06-13: 02:00:00 via INTRAVENOUS

## 2017-06-13 MED ORDER — SODIUM CHLORIDE 0.9 % IV SOLN
INTRAVENOUS | Status: DC | PRN
  Administered 2017-06-13: 01:00:00 via INTRAVENOUS

## 2017-06-13 MED ORDER — LIDOCAINE 2% (20 MG/ML) 5 ML SYRINGE
INTRAMUSCULAR | Status: AC
  Filled 2017-06-13: qty 5

## 2017-06-13 MED ORDER — LOSARTAN POTASSIUM 50 MG PO TABS
50.0000 mg | ORAL_TABLET | Freq: Every day | ORAL | Status: DC
  Administered 2017-06-13: 50 mg via ORAL
  Filled 2017-06-13: qty 1

## 2017-06-13 MED ORDER — ONDANSETRON HCL 4 MG/2ML IJ SOLN: 4.0000 mg | Freq: Four times a day (QID) | INTRAMUSCULAR | Status: DC | PRN

## 2017-06-13 MED ORDER — ONDANSETRON HCL 4 MG/2ML IJ SOLN
INTRAMUSCULAR | Status: AC
  Filled 2017-06-13: qty 2

## 2017-06-13 MED ORDER — OXYCODONE HCL 5 MG/5ML PO SOLN: 5.0000 mg | Freq: Once | ORAL | Status: DC | PRN

## 2017-06-13 MED ORDER — PANTOPRAZOLE SODIUM 40 MG PO TBEC
40.0000 mg | DELAYED_RELEASE_TABLET | Freq: Two times a day (BID) | ORAL | Status: DC
  Administered 2017-06-13: 40 mg via ORAL
  Filled 2017-06-13: qty 1

## 2017-06-13 MED ORDER — MUPIROCIN 2 % EX OINT
1.0000 "application " | TOPICAL_OINTMENT | Freq: Two times a day (BID) | CUTANEOUS | Status: DC
  Administered 2017-06-13: 1 via NASAL
  Filled 2017-06-13: qty 22

## 2017-06-13 MED ORDER — DEXAMETHASONE SODIUM PHOSPHATE 10 MG/ML IJ SOLN
INTRAMUSCULAR | Status: AC
  Filled 2017-06-13: qty 1

## 2017-06-13 MED ORDER — CHLORHEXIDINE GLUCONATE CLOTH 2 % EX PADS
6.0000 | MEDICATED_PAD | Freq: Every day | CUTANEOUS | Status: DC
  Administered 2017-06-13: 6 via TOPICAL

## 2017-06-13 MED ORDER — PREDNISONE 5 MG PO TABS: 5.0000 mg | ORAL_TABLET | Freq: Every day | ORAL | Status: DC

## 2017-06-13 MED ORDER — PHENYLEPHRINE 40 MCG/ML (10ML) SYRINGE FOR IV PUSH (FOR BLOOD PRESSURE SUPPORT)
PREFILLED_SYRINGE | INTRAVENOUS | Status: AC
  Filled 2017-06-13: qty 10

## 2017-06-13 MED ORDER — DEXAMETHASONE SODIUM PHOSPHATE 10 MG/ML IJ SOLN
INTRAMUSCULAR | Status: DC | PRN
  Administered 2017-06-13: 10 mg via INTRAVENOUS

## 2017-06-13 MED ORDER — LOSARTAN POTASSIUM-HCTZ 100-12.5 MG PO TABS: 0.5000 | ORAL_TABLET | Freq: Every day | ORAL | Status: DC

## 2017-06-13 MED ORDER — OXYCODONE-ACETAMINOPHEN 5-325 MG PO TABS
1.0000 | ORAL_TABLET | Freq: Once | ORAL | Status: AC
  Administered 2017-06-13: 1 via ORAL
  Filled 2017-06-13: qty 1

## 2017-06-13 MED ORDER — LIDOCAINE HCL 1 % IJ SOLN
INTRAMUSCULAR | Status: DC | PRN
  Administered 2017-06-13: 80 mg via INTRADERMAL

## 2017-06-13 MED ORDER — ONDANSETRON HCL 4 MG/2ML IJ SOLN
INTRAMUSCULAR | Status: DC | PRN
  Administered 2017-06-13: 4 mg via INTRAVENOUS

## 2017-06-13 MED ORDER — HYDROCHLOROTHIAZIDE 10 MG/ML ORAL SUSPENSION
6.2500 mg | Freq: Every day | ORAL | Status: DC
  Administered 2017-06-13: 6.25 mg via ORAL
  Filled 2017-06-13 (×2): qty 1.25

## 2017-06-13 MED ORDER — PROPOFOL 10 MG/ML IV BOLUS
INTRAVENOUS | Status: DC | PRN
  Administered 2017-06-13: 200 mg via INTRAVENOUS
  Administered 2017-06-13: 50 mg via INTRAVENOUS

## 2017-06-13 MED ORDER — PROPOFOL 10 MG/ML IV BOLUS
INTRAVENOUS | Status: AC
  Filled 2017-06-13: qty 20

## 2017-06-13 MED ORDER — HYDROCODONE-ACETAMINOPHEN 5-325 MG PO TABS: 1.0000 | ORAL_TABLET | Freq: Four times a day (QID) | ORAL | 0 refills | Status: AC | PRN

## 2017-06-13 SURGICAL SUPPLY — 30 items
BANDAGE ACE 4X5 VEL STRL LF (GAUZE/BANDAGES/DRESSINGS) ×2 IMPLANT
BLADE SURG 21 STRL SS (BLADE) ×3 IMPLANT
BNDG CMPR 9X4 STRL LF SNTH (GAUZE/BANDAGES/DRESSINGS)
BNDG COHESIVE 4X5 TAN STRL (GAUZE/BANDAGES/DRESSINGS) ×3 IMPLANT
BNDG ESMARK 4X9 LF (GAUZE/BANDAGES/DRESSINGS) IMPLANT
BNDG GAUZE ELAST 4 BULKY (GAUZE/BANDAGES/DRESSINGS) ×3 IMPLANT
COVER SURGICAL LIGHT HANDLE (MISCELLANEOUS) ×6 IMPLANT
DRAPE U-SHAPE 47X51 STRL (DRAPES) ×3 IMPLANT
DRSG ADAPTIC 3X8 NADH LF (GAUZE/BANDAGES/DRESSINGS) ×2 IMPLANT
DRSG PAD ABDOMINAL 8X10 ST (GAUZE/BANDAGES/DRESSINGS) ×3 IMPLANT
DURAPREP 26ML APPLICATOR (WOUND CARE) ×3 IMPLANT
ELECT REM PT RETURN 9FT ADLT (ELECTROSURGICAL) ×3
ELECTRODE REM PT RTRN 9FT ADLT (ELECTROSURGICAL) ×1 IMPLANT
GAUZE SPONGE 4X4 12PLY STRL (GAUZE/BANDAGES/DRESSINGS) ×2 IMPLANT
GLOVE BIOGEL PI IND STRL 9 (GLOVE) ×1 IMPLANT
GLOVE BIOGEL PI INDICATOR 9 (GLOVE) ×2
GLOVE SURG ORTHO 9.0 STRL STRW (GLOVE) ×3 IMPLANT
GOWN STRL REUS W/ TWL XL LVL3 (GOWN DISPOSABLE) ×2 IMPLANT
GOWN STRL REUS W/TWL XL LVL3 (GOWN DISPOSABLE) ×6
KIT BASIN OR (CUSTOM PROCEDURE TRAY) ×3 IMPLANT
KIT ROOM TURNOVER OR (KITS) ×3 IMPLANT
MANIFOLD NEPTUNE II (INSTRUMENTS) ×3 IMPLANT
NEEDLE 22X1 1/2 (OR ONLY) (NEEDLE) IMPLANT
NS IRRIG 1000ML POUR BTL (IV SOLUTION) ×3 IMPLANT
PACK ORTHO EXTREMITY (CUSTOM PROCEDURE TRAY) ×3 IMPLANT
PAD ABD 8X10 STRL (GAUZE/BANDAGES/DRESSINGS) ×2 IMPLANT
PAD ARMBOARD 7.5X6 YLW CONV (MISCELLANEOUS) ×6 IMPLANT
SUT ETHILON 2 0 PSLX (SUTURE) ×3 IMPLANT
SYR CONTROL 10ML LL (SYRINGE) IMPLANT
TOWEL OR 17X26 10 PK STRL BLUE (TOWEL DISPOSABLE) ×3 IMPLANT

## 2017-06-13 NOTE — H&P (View-Only) (Signed)
   Subjective:    Patient ID: Derrick [name], male    DOB: [birth year], 83 y.o.   MRN: [id number]  HPI Chief Complaint  Patient presents with  . Toe Pain    Right foot; great toe & 2nd toe - medial side; toe is black; pt stated, "Great toe started to turn black after had ingrown toenail procedure; had a stint put in last Thursday, May 30, 2017; Pain 8/10"; x3 weeks    83 y.o. male presents for f/u of R great toe and 2nd toe. Reports worsening black discoloration to the first and second toe. Underwent revascularization by Dr. Corrie Mckusick on 05/30/17. Reports severe pain to his R foot, worst at night.  Review of Systems     Objective:   Physical Exam Vitals:   06/06/17 1049  BP: (!) 148/68  Pulse: 79  Resp: 16  Temp: 98.6 F (37 C)   General AA&O x3. Normal mood and affect.  Vascular Dorsalis pedis and posterior tibial pulses  absent bilaterally  Capillary refill sluggist to all digits. Pedal hair growth absent.  Neurologic Epicritic sensation grossly present.  Dermatologic R hallux distal gangrene, not extending proximal to the IPJ.  R 2nd toe ulceration overlying the medial aspect of the 2nd digit DIPJ and PIPJ. Approximately 1x1 with fibrotic boggy base. Reperfusion hyperemia noted to the distal forefoot.  Orthopedic: Tender to palpation R hallux and 2nd toe   Radiographs: Taken and reviewed. No acute fractures or dislocations. No other osseous abnormalities. No osseous erosions.    Assessment & Plan:  R Hallux Gangrene -Has underwent revascularization with Dr. [name]. -Discussed with patient that he would benefit from amputation of the R hallux and 2nd toe 2/2 gangrene. Discussed possibility that amputations may not heal at the MPJ level due to the extent of his PAD and there is a real risk of re-amputation in the future. Patient and his wife verbalized understanding. -Will plan to proceed with amputation at [place] to do 8/31 due to not having PCP  clearance. Will plan for next week. R foot dressed in betadine and dry sterile dressing. -Short course of tramadol Rxed for rest pain per patient request.  15 minutes of face to face time were spent with the patient. >50% of this was spent on counseling and coordination of care. Specifically discussed with patient the above diagnoses and treatment plan, and coordination of plans for amputation. Patient and wife verbalized understanding.   No Follow-up on file.

## 2017-06-13 NOTE — Anesthesia Preprocedure Evaluation (Signed)
Anesthesia Evaluation  Patient identified by MRN, date of birth, ID band Patient awake    Reviewed: Allergy & Precautions, H&P , NPO status , Patient's Chart, lab work & pertinent test results  Airway Mallampati: II   Neck ROM: full    Dental   Pulmonary shortness of breath, COPD, former smoker,    breath sounds clear to auscultation       Cardiovascular hypertension, + angina + CAD   Rhythm:regular Rate:Normal     Neuro/Psych PSYCHIATRIC DISORDERS Anxiety Depression  Neuromuscular disease    GI/Hepatic hiatal hernia, GERD  ,  Endo/Other    Renal/GU Renal InsufficiencyRenal disease     Musculoskeletal  (+) Arthritis ,   Abdominal   Peds  Hematology   Anesthesia Other Findings   Reproductive/Obstetrics                             Anesthesia Physical Anesthesia Plan  ASA: III  Anesthesia Plan: General   Post-op Pain Management:    Induction: Intravenous  PONV Risk Score and Plan: 2 and Ondansetron, Dexamethasone and Treatment may vary due to age or medical condition  Airway Management Planned: LMA  Additional Equipment:   Intra-op Plan:   Post-operative Plan:   Informed Consent: I have reviewed the patients History and Physical, chart, labs and discussed the procedure including the risks, benefits and alternatives for the proposed anesthesia with the patient or authorized representative who has indicated his/her understanding and acceptance.     Plan Discussed with: CRNA, Anesthesiologist and Surgeon  Anesthesia Plan Comments:         Anesthesia Quick Evaluation

## 2017-06-13 NOTE — Discharge Summary (Signed)
Physician Discharge Summary  Derrick [name]  XNT:[id number]  DOB: 1932-03-29  DOA: 06/12/2017 PCP: [name], MD  Admit date: 06/12/2017 Discharge date: 06/13/2017  Admitted From: Home  Disposition: Home   Recommendations for Outpatient Follow-up:  1. Follow up with PCP in 1-2 weeks 2. Please obtain BMP/CBC in one week 3. Please follow up on the following pending results:  Discharge Condition: Stable   CODE STATUS: FULL   Diet recommendation: Heart Healthy    Brief/Interim Summary: 83 y/o M with PMHx of CAD, hypertension, hyperlipidemia, anemia, COPD, CKD, prostate cancer and RCC s/p nephrectomy presented to the ED c/o pain in r great toe. Patient was scheduled for amputation and was awaiting for cardiologic medical clearance. Patient reported that he could not tolerated pain anymore and came to the ED. Podiatry was consulted, advised surgery if cleared by cardiology. Cardiology was consulted and no recommendations were made. Podiatry proceed with surgical procedure and amputation of the R 1st and 2nd toes was done. Podiatry cleared patient for discharge. Patient clinically improved and will be discharge home to follow up with PCP.   Subjective: Patient seen and examined on the day of discharge. Patient has no complaints. Able to ambulate with ortho shoe. Pain well controlled. No acute events overnight. Remains afebrile. Tolerating diet well.   Discharge Diagnoses/Hospital Course:  1st and 2nd toe gangrene 2/2 PAD  S/p amputation of first and second ray  Will complete course of oral doxy for 5 days  Pain medication prescribed  Follow up with podiatry for wound care recommendations    HTN Blood pressure stable during hospital stay Continue home medication  CKD  Creatinine at baseline Monitor BMP in 1 week  CAD  Evaluated by cardiology, patient has symptomatic. Continue aspirin and Plavix The monitor was noted in white complex tachycardia, not A. Fib was identified. Patient to  follow-up with cardiologist as an outpatient not further cardiac testing at this point   All other chronic medical condition were stable during the hospitalization.  On the day of the discharge the patient's vitals were stable, and no other acute medical condition were reported by patient. Patient was felt safe to be discharge to home  Discharge Instructions  You were cared for by a hospitalist during your hospital stay. If you have any questions about your discharge medications or the care you received while you were in the hospital after you are discharged, you can call the unit and asked to speak with the hospitalist on call if the hospitalist that took care of you is not available. Once you are discharged, your primary care physician will handle any further medical issues. Please note that NO REFILLS for any discharge medications will be authorized once you are discharged, as it is imperative that you return to your primary care physician (or establish a relationship with a primary care physician if you do not have one) for your aftercare needs so that they can reassess your need for medications and monitor your lab values.  Discharge Instructions    Call MD for:  difficulty breathing, headache or visual disturbances    Complete by:  As directed    Call MD for:  hives    Complete by:  As directed    Call MD for:  persistant dizziness or light-headedness    Complete by:  As directed    Call MD for:  persistant nausea and vomiting    Complete by:  As directed    Call MD for:  redness, tenderness, or signs of infection (pain, swelling, redness, odor or green/yellow discharge around incision site)    Complete by:  As directed    Call MD for:  severe uncontrolled pain    Complete by:  As directed    Call MD for:  temperature >100.4    Complete by:  As directed    Diet - low sodium heart healthy    Complete by:  As directed    Increase activity slowly    Complete by:  As directed       Allergies as of 06/13/2017      Reactions   Ace Inhibitors Other (See Comments)   Reaction:  Unknown       Medication List    STOP taking these medications   traMADol 50 MG tablet Commonly known as:  ULTRAM     TAKE these medications   aspirin EC 81 MG tablet Take 81 mg by mouth every evening.   clopidogrel 75 MG tablet Commonly known as:  PLAVIX Take 75 mg by mouth daily.   doxycycline 50 MG capsule Commonly known as:  VIBRAMYCIN Take 1 capsule (50 mg total) by mouth 2 (two) times daily.   finasteride 5 MG tablet Commonly known as:  PROSCAR Take 5 mg by mouth daily.   HYDROcodone-acetaminophen 5-325 MG tablet Commonly known as:  NORCO/VICODIN Take 1-2 tablets by mouth every 6 (six) hours as needed for moderate pain.   losartan-hydrochlorothiazide 100-12.5 MG tablet Commonly known as:  HYZAAR Take 0.5 tablets by mouth daily.   pantoprazole 40 MG tablet Commonly known as:  PROTONIX Take 40 mg by mouth 2 (two) times daily.   predniSONE 5 MG tablet Commonly known as:  DELTASONE Take 5 mg by mouth daily with breakfast.   vitamin B-12 1000 MCG tablet Commonly known as:  CYANOCOBALAMIN Take 1,000 mcg by mouth daily.            Discharge Care Instructions        Start     Ordered   06/13/17 0000  doxycycline (VIBRAMYCIN) 50 MG capsule  2 times daily     06/13/17 1424   06/13/17 0000  Increase activity slowly     06/13/17 1424   06/13/17 0000  Diet - low sodium heart healthy     06/13/17 1424   06/13/17 0000  HYDROcodone-acetaminophen (NORCO/VICODIN) 5-325 MG tablet  Every 6 hours PRN     06/13/17 1424   06/13/17 0000  Call MD for:  temperature >100.4     06/13/17 1424   06/13/17 0000  Call MD for:  persistant nausea and vomiting     06/13/17 1424   06/13/17 0000  Call MD for:  severe uncontrolled pain     06/13/17 1424   06/13/17 0000  Call MD for:  redness, tenderness, or signs of infection (pain, swelling, redness, odor or green/yellow discharge  around incision site)     06/13/17 1424   06/13/17 0000  Call MD for:  difficulty breathing, headache or visual disturbances     06/13/17 1424   06/13/17 0000  Call MD for:  hives     06/13/17 1424   06/13/17 0000  Call MD for:  persistant dizziness or light-headedness     06/13/17 1424     Follow-up Information    [name], MD. Schedule an appointment as soon as possible for a visit in 1 week(s).   Specialty:  Internal Medicine Why:  Hospital follow up  Contact  information: [address] [place] [address] [phone]        [name], DPM. Go on 06/19/2017.   Specialty:  Podiatry Contact information: [address] [address] [phone]          Allergies  Allergen Reactions  . Ace Inhibitors Other (See Comments)    Reaction:  Unknown     Consultations:  Podiatry  Cardiology   Procedures/Studies: Dg Chest 2 View  Result Date: 06/12/2017 CLINICAL DATA:  Ulceration of the first and second toes. Preoperative study prior to foot amputation. EXAM: CHEST  2 VIEW COMPARISON:  Chest x-ray of December 05 2016 FINDINGS: The lungs are adequately inflated. There is no focal infiltrate. The interstitial markings are mildly prominent consistent with the patient's smoking history. The heart and pulmonary vascularity are normal. There is calcification in the wall of the aortic arch. There is no pleural effusion. There is multilevel degenerative disc disease of the thoracic spine. IMPRESSION: Mild chronic bronchitic-smoking related changes, stable. No CHF nor other acute cardiopulmonary abnormality. Thoracic aortic atherosclerosis. Electronically Signed   By: David  Martinique M.D.   On: 06/12/2017 11:02   Ir Angiogram Extremity Right  Result Date: 05/30/2017 INDICATION: 83 year old male with a history of gangrenous right great toe after podiatric surgery. EXAM: RIGHT EXTREMITY ARTERIOGRAPHY; IR ULTRASOUND GUIDANCE VASC ACCESS RIGHT; IR  TIB-PERO ART PTA; IR TIB-PERO ART UNI PAT EA ADD VESSEL MEDICATIONS: 9000 units heparin. 700 mcg intra- arterial nitroglycerin, 300 mg p.o. Plavix, 325 mg p.o. aspirin ANESTHESIA/SEDATION: Moderate (conscious) sedation was employed during this procedure. A total of Versed 4.5 mg and Fentanyl 125 mcg was administered intravenously. Moderate Sedation Time: 180 minutes. The patient's level of consciousness and vital signs were monitored continuously by radiology nursing throughout the procedure under my direct supervision. CONTRAST:  125 cc visit take FLUOROSCOPY TIME:  Fluoroscopy Time: 39 minutes 24 seconds (19.6 mGy). COMPLICATIONS: None PROCEDURE: Informed consent was obtained from the patient following explanation of the procedure, risks, benefits and alternatives. The patient understands, agrees and consents for the procedure. All questions were addressed. A time out was performed prior to the initiation of the procedure. Maximal barrier sterile technique utilized including caps, mask, sterile gowns, sterile gloves, large sterile drape, hand hygiene, and Betadine prep. Ultrasound survey of the right inguinal region was performed with images stored and sent to PACs. A micropuncture needle was used access the right common femoral artery under ultrasound. With excellent arterial blood flow returned, and an .018 micro wire was passed through the needle, observed enter the superficial femoral artery under fluoroscopy. The needle was removed, and a micropuncture sheath was placed over the wire. The inner dilator and wire were removed, and an 035 Bentson wire was advanced under fluoroscopy into the SFA. The sheath was removed and a standard 5 [place] vascular sheath was placed. The dilator was removed and the sheath was flushed. Angiogram of the right lower extremity was performed. Bentson wire was passed into the superficial femoral artery and a 25 cm 6 French sheath was placed. A coaxial 4 French sheath was placed.  These were navigated into the popliteal artery. Coaxial 035 angled CXI catheter (90cm), and a 014 CXi catheter (135cm) were then placed through the 4 French sheath. An 014 whisper wire was then used to navigate into the anterior tibial artery. The wire and catheter combination were then navigated into the distal anterior tibial artery to engage the near occlusive lesion at the distal anterior tibial  artery. The patient received 7000 units IV heparin before engaging the occlusion. Significant fluoroscopic time was then used in an attempt to navigate through the calcified disease of the distal anterior tibial artery. A combination of catheters and wires were used. Wires included Whisper MS Abbott wire, Prowater 300 (widweight) Asahi, 014 Medium weight Abbott wire, 014 Marvel wire (CTO), 014 Fighter wire (CTO), 4g tip CTO wire, 12g tip CTO wire. Corsair Pro crossing catheter [employer] [place] [place]) was also employed without success. The intention was to treat with orbital atherectomy, however, we cannot deliver a wire/catheter combination beyond the dense calcium of the distal anterior tibial artery. Balloon angioplasty was then elected of the diseased anterior tibial artery, with balloon angioplasty distally 1.5 mm diameter and and 2.5 mm diameter. Proximal dilation was performed 2.5 mm, 3.5 mm, and then 4.0 mm at the origin of the anterior tibial artery. At this point the catheter combination was withdrawn into the popliteal artery and repeat angiogram was performed. We then redirected towards the posterior tibial artery. A new whisper wire and the Corsair catheter were navigated into the posterior tibial artery. Dense calcium was encountered in the proximal third. A similar wire strategy and catheter combination would not pass beyond dense calcium in the proximal third. A sapphire II pro rapid exchange 1.0 mm by 10 mm coronary balloon was passed to the first high-grade stenosis with balloon angioplasty performed. Calcium ruptured  the balloon immediately at low atmospheric pressure. The coronary balloon was removed, and the Corsair catheter and whisper wire were replaced. Although the catheter would pass beyond this site of calcium, there was a second sided calcium beyond which the wiring catheter combination would not pass. Catheters and wires were removed and a final angiogram was performed. ACT was recorded 191. Exoseal was deployed for hemostasis at the puncture site. Patient tolerated the procedure well and remained hemodynamically stable throughout. No complications were encountered and no significant blood loss. IMPRESSION: Status post ultrasound-guided access right common femoral artery for antegrade approach to tibial disease, with balloon angioplasty of diseased anterior tibial artery and posterior tibial artery, and unsuccessful pedal loop access/ orbital atherectomy secondary to dense calcium. Signed, Dulcy Fanny. [name], DO [facility] Radiology Electronically Signed   By: Corrie Mckusick D.O.   On: 05/30/2017 18:08   Ct Angio Ao+bifem W & Or Wo Contrast  Result Date: 05/22/2017 CLINICAL DATA:  Non healing wound involving in the right great toe for the past 2 weeks. History of renal cell carcinoma, post right-sided nephrectomy nephrectomy. EXAM: CT ANGIOGRAPHY OF ABDOMINAL AORTA WITH ILIOFEMORAL RUNOFF TECHNIQUE: Multidetector CT imaging of the abdomen, pelvis and lower extremities was performed using the standard protocol during bolus administration of intravenous contrast. Multiplanar CT image reconstructions and MIPs were obtained to evaluate the vascular anatomy. CONTRAST:  80 cc Isovue 370 (a reduced dose of contrast was administered given its chronic renal insufficiency. COMPARISON:  Chest CT - 09/05/2015 ; pelvic MRI - 02/07/2006 ; CT abdomen pelvis - 11/15/2000 FINDINGS: Vascular Findings: Abdominal aorta: There is a moderate amount of eccentric mixed calcified and  noncalcified atherosclerotic plaque with a normal caliber abdominal aorta, not resulting in hemodynamically significant stenosis. No abdominal aortic dissection or periaortic stranding. Celiac artery: There is a minimal amount of eccentric mixed calcified and noncalcified atherosclerotic plaque involving the origin of the celiac artery, not resulting in hemodynamically significant stenosis. Conventional branching pattern. SMA: There is a minimal amount of eccentric mixed calcified and noncalcified atherosclerotic plaque involving the  origin the SMA, not resulting in hemodynamically significant stenosis. Conventional branching pattern. The distal tributaries of the SMA are widely patent without discrete intraluminal filling defect to suggest distal embolism. Renal arteries: Solitary left-sided renal artery is widely patent without hemodynamically significant stenosis. The right renal artery is expected leak diminutive post right-sided nephrectomy. IMA: Diseased at its origin though remains patent throughout its imaged course. -------------------------------------------------------------------------------- RIGHT LOWER EXTREMITY VASCULATURE: Right-sided pelvic vasculature: There is a moderate amount of crescentic calcified atherosclerotic plaque involving the right common iliac artery, not resulting in hemodynamically significant stenosis. The right internal iliac artery is mildly disease though patent of normal caliber. There is a minimal amount of E centric mixed calcified and noncalcified atherosclerotic plaque within the distal aspect of the right external iliac artery, not resulting in hemodynamically significant stenosis. Right common femoral artery: Minimal amount of eccentric mixed calcified and noncalcified atherosclerotic plaque within the right common femoral artery, not resulting in hemodynamically significant stenosis. Right deep femoral artery: Widely patent. Right superficial femoral artery: There is a  minimal amount of thin crescentic calcified atherosclerotic plaque throughout the right superficial femoral artery, not resulting in hemodynamically significant stenosis. Right popliteal artery: There is a minimal amount of thin crescentic calcified atherosclerotic plaque involving the right above and below-knee popliteal arteries, not resulting in hemodynamically significant stenosis. Right lower leg: Thin mural calcifications are noted throughout the tibial vasculature limiting intra-arterial evaluation. There is an apparent three-vessel runoff to the right lower leg and foot. The right-sided dorsalis pedis artery appears undergo short-segment occlusion at the level of the hindfoot (image 438, series 4) though reconstitutes distally and supplies the right great digit. No discrete intraluminal seen the defect to suggest distal embolism. --------------------------------------------------------------------------------- LEFT LOWER EXTREMITY VASCULATURE: Left-sided pelvic vasculature: There is a moderate amount of eccentric mixed calcified and noncalcified atherosclerotic plaque within the left common iliac artery, not resulting in hemodynamically significant stenosis. The left internal iliac artery is diseased though patent and of normal caliber. The left external iliac artery appears widely patent throughout its course. Left common femoral artery: There is a minimal amount of mixed calcified and noncalcified atherosclerotic plaque within the left common femoral artery, not resulting in hemodynamically significant stenosis. Left deep femoral artery: Widely patent without hemodynamically significant stenosis. Left superficial femoral artery: There is a thin crescentic calcification throughout the left superficial femoral artery, not resulting in hemodynamically significant stenosis. Left popliteal artery: Thin crescentic calcification involving both the left above and below-knee popliteal artery, not resulting in  hemodynamically significant stenosis. Left lower leg: Thin crescentic mural calcification involving the tibial vasculature, limiting intra-arterial evaluation. Given this limitation, there is apparent three-vessel runoff to the left lower leg and foot. The left-sided dorsalis pedis artery is patent to the level of the forefoot. Review of the MIP images confirms the above findings. -------------------------------------------------------------------------------- Nonvascular Findings: Evaluation the abdominal organs is limited to the arterial phase of enhancement. Lower chest: Limited visualization lower thorax demonstrates minimal dependent subpleural ground-glass atelectasis. No focal airspace opacities. No pleural effusion. Normal heart size.  No pericardial effusion. Hepatobiliary: Normal hepatic contour. No discrete hyperenhancing hepatic lesions. Normal appearance of the gallbladder given degree distention. No radiopaque gallstones. No intra extrahepatic biliary duct dilatation. No ascites. Pancreas: Normal appearance of the pancreas. Spleen: Normal appearance of the spleen. Adrenals/Urinary Tract: Post right-sided nephrectomy. There is a minimal amount of nodular tissue within the right nephrectomy bed which measures approximately 1.6 x 1.2 cm (image 54, series 4), likely the remnant of the right kidney  and similar to remote abdominal CT performed 11/15/2000. Normal early arterial phase appearance of the left kidney. No definite renal stones this postcontrast examination. No hyperenhancing renal lesions. There is a minimal amount of left-sided perinephric stranding. No evidence of left-sided urinary obstruction. Normal appearance the bilateral adrenal glands. There is mass effect upon the undersurface of the urinary bladder by the prostate. Otherwise, normal appearance of the urinary bladder given degree distention peer Stomach/Bowel: Scattered minimal colonic diverticulosis without evidence of diverticulitis.  Moderate colonic stool burden without evidence of enteric obstruction. Normal appearance of the terminal ileum and retrocecal appendix. No pneumoperitoneum, pneumatosis or portal venous gas. Lymphatic: No bulky retroperitoneal, mesenteric, pelvic or inguinal lymphadenopathy. Reproductive: Prostate is enlarged, measuring approximately 5.9 x 4.5 cm with mass effect upon the undersurface urinary bladder. Other: Regional soft tissues appear normal. Musculoskeletal: No acute or aggressive osseous abnormalities. Mild-to-moderate multilevel lumbar spine DDD, worse at L1-L2 with disc space height loss, endplate irregularity and sclerosis. There is minimal wall) approximately 7 mm) of anterolisthesis of L5 upon S1 with associated moderate to severe facet degenerative change at this level. IMPRESSION: VASCULAR 1. Moderate amount of atherosclerotic plaque within a normal caliber abdominal aorta. Aortic Atherosclerosis (ICD10-I70.0). 2. No evidence of a proximal, large vessel hemodynamically significant stenosis affecting the either lower extremity. 3. Degraded evaluation of the runoff vessels secondary to thin crescentic mural calcifications involving the bilateral tibial vasculature, however there appears to be a three-vessel runoff to the bilateral lower legs and feet. If there is persistent clinical concern for distal tibial disease, further evaluation with catheter directed angiography could be performed as clinically indicated. NON-VASCULAR 1. No acute findings within the abdomen or pelvis. 2. Stable sequela of prior right nephrectomy with minimal amount of suspected residual right renal parenchymal within the right nephrectomy bed, similar to the 2002 abdominal CT. 3. Prostatomegaly 4. Scattered minimal colonic diverticulosis without evidence of diverticulitis. Electronically Signed   By: Sandi Mariscal M.D.   On: 05/22/2017 17:40   US Arterial Seg Multiple  Result Date: 05/16/2017 CLINICAL DATA:  Infection following  trimming of the right great toenail. Evaluate for PAD. History of smoking. No history of diabetes. Patient denies claudication symptoms. EXAM: NONINVASIVE PHYSIOLOGIC VASCULAR STUDY OF BILATERAL LOWER EXTREMITIES TECHNIQUE: Evaluation of both lower extremities was performed at rest, including calculation of ankle-brachial indices, multiple segmental pressure evaluation, segmental Doppler and segmental pulse volume recording. COMPARISON:  None. FINDINGS: Right ABI: Unable to be obtained secondary to noncompressibility of the distal vasculature. Left ABI: Unable to be obtained secondary to noncompressibility of the distal vasculature. Right Lower Extremity: Biphasic waveforms are demonstrated within the right femoral and popliteal arteries. Monophasic waveforms are demonstrated within the right posterior tibial and dorsalis pedis arteries. Left Lower Extremity: Biphasic waveforms are demonstrated within the left femoral and popliteal arteries. Monophasic waveforms are demonstrated within left posterior tibial and dorsalis pedis arteries. Blunted monophasic PVR waveforms are demonstrated at the level of the left ankle. No significant PVR waveforms are acquired at the level of either great digit. IMPRESSION: 1. Inability to acquire ABIs within either lower extremity secondary to noncompressibility of the distal vasculature. 2. Abnormal arterial and PVR waveforms potentially indicative of hemodynamically significant vasoocclusive disease. Clinical correlation is advised. Further evaluation with CTA run-off could be performed as clinically indicated. Electronically Signed   By: Sandi Mariscal M.D.   On: 05/16/2017 15:42   Ir Tib-pero Cassandria Anger Mod Sed  Result Date: 05/30/2017 INDICATION: 83 year old male with a history of gangrenous right  great toe after podiatric surgery. EXAM: RIGHT EXTREMITY ARTERIOGRAPHY; IR ULTRASOUND GUIDANCE VASC ACCESS RIGHT; IR TIB-PERO ART PTA; IR TIB-PERO ART UNI PAT EA ADD VESSEL MEDICATIONS:  9000 units heparin. 700 mcg intra- arterial nitroglycerin, 300 mg p.o. Plavix, 325 mg p.o. aspirin ANESTHESIA/SEDATION: Moderate (conscious) sedation was employed during this procedure. A total of Versed 4.5 mg and Fentanyl 125 mcg was administered intravenously. Moderate Sedation Time: 180 minutes. The patient's level of consciousness and vital signs were monitored continuously by radiology nursing throughout the procedure under my direct supervision. CONTRAST:  125 cc visit take FLUOROSCOPY TIME:  Fluoroscopy Time: 39 minutes 24 seconds (19.6 mGy). COMPLICATIONS: None PROCEDURE: Informed consent was obtained from the patient following explanation of the procedure, risks, benefits and alternatives. The patient understands, agrees and consents for the procedure. All questions were addressed. A time out was performed prior to the initiation of the procedure. Maximal barrier sterile technique utilized including caps, mask, sterile gowns, sterile gloves, large sterile drape, hand hygiene, and Betadine prep. Ultrasound survey of the right inguinal region was performed with images stored and sent to PACs. A micropuncture needle was used access the right common femoral artery under ultrasound. With excellent arterial blood flow returned, and an .018 micro wire was passed through the needle, observed enter the superficial femoral artery under fluoroscopy. The needle was removed, and a micropuncture sheath was placed over the wire. The inner dilator and wire were removed, and an 035 Bentson wire was advanced under fluoroscopy into the SFA. The sheath was removed and a standard 5 [place] vascular sheath was placed. The dilator was removed and the sheath was flushed. Angiogram of the right lower extremity was performed. Bentson wire was passed into the superficial femoral artery and a 25 cm 6 French sheath was placed. A coaxial 4 French sheath was placed. These were navigated into the popliteal artery. Coaxial 035 angled CXI  catheter (90cm), and a 014 CXi catheter (135cm) were then placed through the 4 French sheath. An 014 whisper wire was then used to navigate into the anterior tibial artery. The wire and catheter combination were then navigated into the distal anterior tibial artery to engage the near occlusive lesion at the distal anterior tibial artery. The patient received 7000 units IV heparin before engaging the occlusion. Significant fluoroscopic time was then used in an attempt to navigate through the calcified disease of the distal anterior tibial artery. A combination of catheters and wires were used. Wires included Whisper MS Abbott wire, Prowater 300 (widweight) Asahi, 014 Medium weight Abbott wire, 014 Marvel wire (CTO), 014 Fighter wire (CTO), 4g tip CTO wire, 12g tip CTO wire. Corsair Pro crossing catheter [facility]) was also employed without success. The intention was to treat with orbital atherectomy, however, we cannot deliver a wire/catheter combination beyond the dense calcium of the distal anterior tibial artery. Balloon angioplasty was then elected of the diseased anterior tibial artery, with balloon angioplasty distally 1.5 mm diameter and and 2.5 mm diameter. Proximal dilation was performed 2.5 mm, 3.5 mm, and then 4.0 mm at the origin of the anterior tibial artery. At this point the catheter combination was withdrawn into the popliteal artery and repeat angiogram was performed. We then redirected towards the posterior tibial artery. A new whisper wire and the Corsair catheter were navigated into the posterior tibial artery. Dense calcium was encountered in the proximal third. A similar wire strategy and catheter combination would not pass beyond dense calcium in the proximal third. A sapphire II pro  rapid exchange 1.0 mm by 10 mm coronary balloon was passed to the first high-grade stenosis with balloon angioplasty performed. Calcium ruptured the balloon immediately at low atmospheric pressure. The coronary  balloon was removed, and the Corsair catheter and whisper wire were replaced. Although the catheter would pass beyond this site of calcium, there was a second sided calcium beyond which the wiring catheter combination would not pass. Catheters and wires were removed and a final angiogram was performed. ACT was recorded 191. Exoseal was deployed for hemostasis at the puncture site. Patient tolerated the procedure well and remained hemodynamically stable throughout. No complications were encountered and no significant blood loss. IMPRESSION: Status post ultrasound-guided access right common femoral artery for antegrade approach to tibial disease, with balloon angioplasty of diseased anterior tibial artery and posterior tibial artery, and unsuccessful pedal loop access/ orbital atherectomy secondary to dense calcium. Signed, Dulcy Fanny. [name], DO [facility] Radiology Electronically Signed   By: Corrie Mckusick D.O.   On: 05/30/2017 18:08   Ir Tib-pero Art Uni Pta Ea Add Vessel Mod Sed  Result Date: 05/30/2017 INDICATION: 83 year old male with a history of gangrenous right great toe after podiatric surgery. EXAM: RIGHT EXTREMITY ARTERIOGRAPHY; IR ULTRASOUND GUIDANCE VASC ACCESS RIGHT; IR TIB-PERO ART PTA; IR TIB-PERO ART UNI PAT EA ADD VESSEL MEDICATIONS: 9000 units heparin. 700 mcg intra- arterial nitroglycerin, 300 mg p.o. Plavix, 325 mg p.o. aspirin ANESTHESIA/SEDATION: Moderate (conscious) sedation was employed during this procedure. A total of Versed 4.5 mg and Fentanyl 125 mcg was administered intravenously. Moderate Sedation Time: 180 minutes. The patient's level of consciousness and vital signs were monitored continuously by radiology nursing throughout the procedure under my direct supervision. CONTRAST:  125 cc visit take FLUOROSCOPY TIME:  Fluoroscopy Time: 39 minutes 24 seconds (19.6 mGy). COMPLICATIONS: None PROCEDURE: Informed consent was obtained from the  patient following explanation of the procedure, risks, benefits and alternatives. The patient understands, agrees and consents for the procedure. All questions were addressed. A time out was performed prior to the initiation of the procedure. Maximal barrier sterile technique utilized including caps, mask, sterile gowns, sterile gloves, large sterile drape, hand hygiene, and Betadine prep. Ultrasound survey of the right inguinal region was performed with images stored and sent to PACs. A micropuncture needle was used access the right common femoral artery under ultrasound. With excellent arterial blood flow returned, and an .018 micro wire was passed through the needle, observed enter the superficial femoral artery under fluoroscopy. The needle was removed, and a micropuncture sheath was placed over the wire. The inner dilator and wire were removed, and an 035 Bentson wire was advanced under fluoroscopy into the SFA. The sheath was removed and a standard 5 [place] vascular sheath was placed. The dilator was removed and the sheath was flushed. Angiogram of the right lower extremity was performed. Bentson wire was passed into the superficial femoral artery and a 25 cm 6 French sheath was placed. A coaxial 4 French sheath was placed. These were navigated into the popliteal artery. Coaxial 035 angled CXI catheter (90cm), and a 014 CXi catheter (135cm) were then placed through the 4 French sheath. An 014 whisper wire was then used to navigate into the anterior tibial artery. The wire and catheter combination were then navigated into the distal anterior tibial artery to engage the near occlusive lesion at the distal anterior tibial artery. The patient received 7000 units IV heparin before engaging the occlusion. Significant fluoroscopic time was then used in  an attempt to navigate through the calcified disease of the distal anterior tibial artery. A combination of catheters and wires were used. Wires included Whisper MS  Abbott wire, Prowater 300 (widweight) Asahi, 014 Medium weight Abbott wire, 014 Marvel wire (CTO), 014 Fighter wire (CTO), 4g tip CTO wire, 12g tip CTO wire. Corsair Pro crossing catheter [facility]) was also employed without success. The intention was to treat with orbital atherectomy, however, we cannot deliver a wire/catheter combination beyond the dense calcium of the distal anterior tibial artery. Balloon angioplasty was then elected of the diseased anterior tibial artery, with balloon angioplasty distally 1.5 mm diameter and and 2.5 mm diameter. Proximal dilation was performed 2.5 mm, 3.5 mm, and then 4.0 mm at the origin of the anterior tibial artery. At this point the catheter combination was withdrawn into the popliteal artery and repeat angiogram was performed. We then redirected towards the posterior tibial artery. A new whisper wire and the Corsair catheter were navigated into the posterior tibial artery. Dense calcium was encountered in the proximal third. A similar wire strategy and catheter combination would not pass beyond dense calcium in the proximal third. A sapphire II pro rapid exchange 1.0 mm by 10 mm coronary balloon was passed to the first high-grade stenosis with balloon angioplasty performed. Calcium ruptured the balloon immediately at low atmospheric pressure. The coronary balloon was removed, and the Corsair catheter and whisper wire were replaced. Although the catheter would pass beyond this site of calcium, there was a second sided calcium beyond which the wiring catheter combination would not pass. Catheters and wires were removed and a final angiogram was performed. ACT was recorded 191. Exoseal was deployed for hemostasis at the puncture site. Patient tolerated the procedure well and remained hemodynamically stable throughout. No complications were encountered and no significant blood loss. IMPRESSION: Status post ultrasound-guided access right common femoral artery for antegrade  approach to tibial disease, with balloon angioplasty of diseased anterior tibial artery and posterior tibial artery, and unsuccessful pedal loop access/ orbital atherectomy secondary to dense calcium. Signed, Dulcy Fanny. [name], DO [facility] Radiology Electronically Signed   By: Corrie Mckusick D.O.   On: 05/30/2017 18:08   Ir US Guide Vasc Access Right  Result Date: 05/30/2017 INDICATION: 83 year old male with a history of gangrenous right great toe after podiatric surgery. EXAM: RIGHT EXTREMITY ARTERIOGRAPHY; IR ULTRASOUND GUIDANCE VASC ACCESS RIGHT; IR TIB-PERO ART PTA; IR TIB-PERO ART UNI PAT EA ADD VESSEL MEDICATIONS: 9000 units heparin. 700 mcg intra- arterial nitroglycerin, 300 mg p.o. Plavix, 325 mg p.o. aspirin ANESTHESIA/SEDATION: Moderate (conscious) sedation was employed during this procedure. A total of Versed 4.5 mg and Fentanyl 125 mcg was administered intravenously. Moderate Sedation Time: 180 minutes. The patient's level of consciousness and vital signs were monitored continuously by radiology nursing throughout the procedure under my direct supervision. CONTRAST:  125 cc visit take FLUOROSCOPY TIME:  Fluoroscopy Time: 39 minutes 24 seconds (19.6 mGy). COMPLICATIONS: None PROCEDURE: Informed consent was obtained from the patient following explanation of the procedure, risks, benefits and alternatives. The patient understands, agrees and consents for the procedure. All questions were addressed. A time out was performed prior to the initiation of the procedure. Maximal barrier sterile technique utilized including caps, mask, sterile gowns, sterile gloves, large sterile drape, hand hygiene, and Betadine prep. Ultrasound survey of the right inguinal region was performed with images stored and sent to PACs. A micropuncture needle was used access the right common femoral artery under ultrasound.  With excellent arterial blood flow returned, and an .018  micro wire was passed through the needle, observed enter the superficial femoral artery under fluoroscopy. The needle was removed, and a micropuncture sheath was placed over the wire. The inner dilator and wire were removed, and an 035 Bentson wire was advanced under fluoroscopy into the SFA. The sheath was removed and a standard 5 [place] vascular sheath was placed. The dilator was removed and the sheath was flushed. Angiogram of the right lower extremity was performed. Bentson wire was passed into the superficial femoral artery and a 25 cm 6 French sheath was placed. A coaxial 4 French sheath was placed. These were navigated into the popliteal artery. Coaxial 035 angled CXI catheter (90cm), and a 014 CXi catheter (135cm) were then placed through the 4 French sheath. An 014 whisper wire was then used to navigate into the anterior tibial artery. The wire and catheter combination were then navigated into the distal anterior tibial artery to engage the near occlusive lesion at the distal anterior tibial artery. The patient received 7000 units IV heparin before engaging the occlusion. Significant fluoroscopic time was then used in an attempt to navigate through the calcified disease of the distal anterior tibial artery. A combination of catheters and wires were used. Wires included Whisper MS Abbott wire, Prowater 300 (widweight) Asahi, 014 Medium weight Abbott wire, 014 Marvel wire (CTO), 014 Fighter wire (CTO), 4g tip CTO wire, 12g tip CTO wire. Corsair Pro crossing catheter [facility]) was also employed without success. The intention was to treat with orbital atherectomy, however, we cannot deliver a wire/catheter combination beyond the dense calcium of the distal anterior tibial artery. Balloon angioplasty was then elected of the diseased anterior tibial artery, with balloon angioplasty distally 1.5 mm diameter and and 2.5 mm diameter. Proximal dilation was performed 2.5 mm, 3.5 mm, and then 4.0 mm at the origin of the  anterior tibial artery. At this point the catheter combination was withdrawn into the popliteal artery and repeat angiogram was performed. We then redirected towards the posterior tibial artery. A new whisper wire and the Corsair catheter were navigated into the posterior tibial artery. Dense calcium was encountered in the proximal third. A similar wire strategy and catheter combination would not pass beyond dense calcium in the proximal third. A sapphire II pro rapid exchange 1.0 mm by 10 mm coronary balloon was passed to the first high-grade stenosis with balloon angioplasty performed. Calcium ruptured the balloon immediately at low atmospheric pressure. The coronary balloon was removed, and the Corsair catheter and whisper wire were replaced. Although the catheter would pass beyond this site of calcium, there was a second sided calcium beyond which the wiring catheter combination would not pass. Catheters and wires were removed and a final angiogram was performed. ACT was recorded 191. Exoseal was deployed for hemostasis at the puncture site. Patient tolerated the procedure well and remained hemodynamically stable throughout. No complications were encountered and no significant blood loss. IMPRESSION: Status post ultrasound-guided access right common femoral artery for antegrade approach to tibial disease, with balloon angioplasty of diseased anterior tibial artery and posterior tibial artery, and unsuccessful pedal loop access/ orbital atherectomy secondary to dense calcium. Signed, Dulcy Fanny. [name], DO [facility] Radiology Electronically Signed   By: Corrie Mckusick D.O.   On: 05/30/2017 18:08   Dg Foot 2 Views Right  Result Date: 06/13/2017 CLINICAL DATA:  83 y/o  M; first and second toe amputation. EXAM: RIGHT FOOT -  2 VIEW COMPARISON:  06/12/2017 foot radiographs FINDINGS: Postsurgical changes related to the first and second digit amputation at the  metatarsophalangeal joints. Extensive vascular calcification. No acute fracture or dislocation identified. Lisfranc alignment is maintained. IMPRESSION: Postsurgical changes related of first and second digit amputation at the metatarsophalangeal joints. Electronically Signed   By: Kristine Garbe M.D.   On: 06/13/2017 02:47   Dg Foot 2 Views Right  Result Date: 06/12/2017 CLINICAL DATA:  Infected/ ulcerated first and second toe. Preop for amputation. EXAM: RIGHT FOOT - 2 VIEW COMPARISON:  06/06/2017 FINDINGS: Stable compared to prior. There is no evidence of fracture, bony erosion, or dislocation. First MTP osteoarthritis with joint narrowing and spurring. Arterial calcification. Osteopenia. No opaque foreign body. IMPRESSION: Stable compared 06/06/2017.  No acute or erosive finding. Electronically Signed   By: Monte Fantasia M.D.   On: 06/12/2017 11:02   Dg Foot Complete Right  Result Date: 06/06/2017 Please see detailed radiograph report in office note.   Discharge Exam: Vitals:   06/13/17 0446 06/13/17 0614  BP: 125/63 135/87  Pulse: (!) 51 88  Resp:  16  Temp:  97.8 F (36.6 C)  SpO2: 92% 96%   Vitals:   06/13/17 0417 06/13/17 0431 06/13/17 0446 06/13/17 0614  BP: 103/65 (!) 115/56 125/63 135/87  Pulse: 72 68 (!) 51 88  Resp:    16  Temp:    97.8 F (36.6 C)  TempSrc:    Oral  SpO2: 95% 90% 92% 96%    General: Pt is alert, awake, not in acute distress Cardiovascular: RRR, S1/S2 +, no rubs, no gallops Respiratory: CTA bilaterally, no wheezing, no rhonchi Abdominal: Soft, NT, ND, bowel sounds + Extremities: right foot with dressing clean and intact   The results of significant diagnostics from this hospitalization (including imaging, microbiology, ancillary and laboratory) are listed below for reference.     Microbiology: Recent Results (from the past 240 hour(s))  Surgical PCR screen     Status: Abnormal   Collection Time: 06/12/17  4:41 PM  Result Value Ref  Range Status   MRSA, PCR NEGATIVE NEGATIVE Final   Staphylococcus aureus POSITIVE (A) NEGATIVE Final    Comment: (NOTE) The Xpert SA Assay (FDA approved for NASAL specimens in patients 72 years of age and older), is one component of a comprehensive surveillance program. It is not intended to diagnose infection nor to guide or monitor treatment.      Labs: BNP (last 3 results)  Recent Labs  12/05/16 1300  BNP 95.1   Basic Metabolic Panel:  Recent Labs Lab 06/12/17 1026 06/13/17 0457  NA 130* 133*  K 4.2 4.6  CL 99* 101  CO2 20* 24  GLUCOSE 107* 104*  BUN 12 16  CREATININE 1.22 1.38*  CALCIUM 9.6 9.2   Liver Function Tests:  Recent Labs Lab 06/12/17 1026  AST 19  ALT 17  ALKPHOS 50  BILITOT 1.0  PROT 6.8  ALBUMIN 3.5   No results for input(s): LIPASE, AMYLASE in the last 168 hours. No results for input(s): AMMONIA in the last 168 hours. CBC:  Recent Labs Lab 06/12/17 1026 06/13/17 0457  WBC 10.2 8.3  NEUTROABS 7.3  --   HGB 12.4* 12.4*  HCT 36.8* 36.7*  MCV 85.8 87.6  PLT 212 206   Cardiac Enzymes: No results for input(s): CKTOTAL, CKMB, CKMBINDEX, TROPONINI in the last 168 hours. BNP: Invalid input(s): POCBNP CBG: No results for input(s): GLUCAP in the last 168 hours. D-Dimer No  results for input(s): DDIMER in the last 72 hours. Hgb A1c No results for input(s): HGBA1C in the last 72 hours. Lipid Profile No results for input(s): CHOL, HDL, LDLCALC, TRIG, CHOLHDL, LDLDIRECT in the last 72 hours. Thyroid function studies No results for input(s): TSH, T4TOTAL, T3FREE, THYROIDAB in the last 72 hours.  Invalid input(s): FREET3 Anemia work up No results for input(s): VITAMINB12, FOLATE, FERRITIN, TIBC, IRON, RETICCTPCT in the last 72 hours. Urinalysis    Component Value Date/Time   COLORURINE YELLOW 11/27/2013 [place] 11/27/2013 0947   LABSPEC 1.019 11/27/2013 0947   PHURINE 7.5 11/27/2013 0947   GLUCOSEU NEGATIVE  11/27/2013 0947   HGBUR NEGATIVE 11/27/2013 0947   BILIRUBINUR NEGATIVE 11/27/2013 0947   KETONESUR NEGATIVE 11/27/2013 0947   PROTEINUR 30 (A) 11/27/2013 0947   UROBILINOGEN 1.0 11/27/2013 0947   NITRITE NEGATIVE 11/27/2013 0947   LEUKOCYTESUR NEGATIVE 11/27/2013 0947   Sepsis Labs Invalid input(s): PROCALCITONIN,  WBC,  LACTICIDVEN Microbiology Recent Results (from the past 240 hour(s))  Surgical PCR screen     Status: Abnormal   Collection Time: 06/12/17  4:41 PM  Result Value Ref Range Status   MRSA, PCR NEGATIVE NEGATIVE Final   Staphylococcus aureus POSITIVE (A) NEGATIVE Final    Comment: (NOTE) The Xpert SA Assay (FDA approved for NASAL specimens in patients 60 years of age and older), is one component of a comprehensive surveillance program. It is not intended to diagnose infection nor to guide or monitor treatment.     Time coordinating discharge: 25 minutes  SIGNED:  Chipper Oman, MD  [facility] 06/13/2017, 2:24 PM  Pager please text page via  [email or web address] Password [email or web address]

## 2017-06-13 NOTE — Progress Notes (Signed)
Derrick [name] to be D/C'd Home per MD order.  Discussed with the patient and all questions fully answered.  VSS, Skin clean, dry and intact without evidence of skin break down, no evidence of skin tears noted. IV catheter discontinued intact. Site without signs and symptoms of complications. Dressing and pressure applied.  An After Visit Summary was printed and given to the patient. Patient received prescription.  D/c education completed with patient/family including follow up instructions, medication list, d/c activities limitations if indicated, with other d/c instructions as indicated by MD - patient able to verbalize understanding, all questions fully answered.   Patient instructed to return to ED, call 911, or call MD for any changes in condition.   Patient escorted via [place], and D/C home via private auto.  Christoper Fabian [name] 06/13/2017 3:24 PM

## 2017-06-13 NOTE — Op Note (Signed)
Patient Name: Derrick [name] DOB: 10/29/1931  MRN: [id number]  Date of Service: 06/13/17   Surgeon: Dr. [name], DPM Assistants: None Pre-operative Diagnosis: Gangrene R 1st and 2nd toes Post-operative Diagnosis: same Procedures:             1) Amputation R 1st Toe  2) Amputation R 2nd Toe  Pathology/Specimens:             1) Right 1st and 2nd Toes Anesthesia: General with 20 ccs 0.5% Marcaine (10 pre-procedure, 10 post-procedure) Hemostasis: Anatomic Estimated Blood Loss: 25 Materials: None Medications: none Complications: None  Indications for Procedure: Derrick [name] is a 83 y.o. male who presents with gangrene of his R 1st and 2nd toes. It was discussed with the patient that he would benefit from amputation of the affected digits. All risk, benefits, and alternatives were discussed with the patient, including delayed wound healing due to poor circulation. It was discussed he may need a more proximal amputation if today's procedures do not heal due to poor circulation. Patient elected to proceed.  Procedure in Detail: Patient was identified in pre-operative holding area. Formal consent was signed and the right lower extremity was marked. Patient was brought back to the operating room and placed on the operating room table in the supine position. Anesthesia was induced. The right lower extremity was prepped and draped in the usual sterile fashion. Timeout was taken to confirm patient name, laterality, and procedure prior to incision. Attention was then directed to the right foot, where a racket shaped incision was made about the right 1st metatarsophalangeal joint. Dissection was carried down to the joint. All collateral ligaments were freed. The soft tissue was then freed from the proximal phalanx bone and the toe was disarticulated at the metatarsophalangeal joint. Attention was directed to the R 2nd toe where the same racket-type incision made and the toe was disarticulated at the  2nd metatarsophalangeal joint in similar manner. All residual tendons were then pulled taut and cut with scissors to allow retraction. The areas were then copiously irrigated. The incision was closed with 3-0 nylon. Dog-ears were cut out with scissors. The foot was then dressed with adaptic, 4x4, kerlix, ABD, and ACE bandage. Patient tolerated the procedure well.  Disposition: Following a period of post-operative monitoring, patient will be transferred back to the floor. XR to be taken post-operatively. He will be ok for discharge later today. He has a follow up appointment next Wednesday, 06/19/17.

## 2017-06-13 NOTE — Brief Op Note (Signed)
06/12/2017 - 06/13/2017  2:11 AM  PATIENT:  Derrick [name]  83 y.o. male  PRE-OPERATIVE DIAGNOSIS:  gangrene toes  POST-OPERATIVE DIAGNOSIS:  gangrene toes  PROCEDURE:  Procedure(s): AMPUTATION TOE 1st and 2nd (Right)  SURGEON:  Surgeon(s) and Role:    * [name], DPM - Primary  PHYSICIAN ASSISTANT:   ASSISTANTS: none   ANESTHESIA:   local and general  EBL:  Total I/O In: 1000 [I.V.:1000] Out: 10 [Blood:10]  BLOOD ADMINISTERED:none  DRAINS: none   LOCAL MEDICATIONS USED:  MARCAINE     SPECIMEN:  Source of Specimen:  R 1st and 2nd toes  DISPOSITION OF SPECIMEN:  PATHOLOGY  COUNTS:  YES  TOURNIQUET:  * No tourniquets in log *  DICTATION: .Note written in EPIC  PLAN OF CARE: back to floor  PATIENT DISPOSITION:  PACU - hemodynamically stable.   Delay start of Pharmacological VTE agent (>24hrs) due to surgical blood loss or risk of bleeding: no

## 2017-06-13 NOTE — Progress Notes (Signed)
Orthopedic Tech Progress Note Patient Details:  Derrick [name] August 17, 1932 [id number]  Ortho Devices Type of Ortho Device: Postop shoe/boot Ortho Device/Splint Location: rle Ortho Device/Splint Interventions: Application   Lalania Haseman 06/13/2017, 7:26 AM

## 2017-06-13 NOTE — Anesthesia Procedure Notes (Signed)
Procedure Name: LMA Insertion Date/Time: 06/13/2017 1:01 AM Performed by: Claris Che Pre-anesthesia Checklist: Patient identified, Emergency Drugs available, Suction available, Patient being monitored and Timeout performed Patient Re-evaluated:Patient Re-evaluated prior to induction Oxygen Delivery Method: Circle system utilized Preoxygenation: Pre-oxygenation with 100% oxygen Induction Type: IV induction Ventilation: Mask ventilation without difficulty LMA: LMA inserted LMA Size: 5.0 Number of attempts: 2 Placement Confirmation: positive ETCO2 and breath sounds checked- equal and bilateral Tube secured with: Tape Dental Injury: Teeth and Oropharynx as per pre-operative assessment

## 2017-06-13 NOTE — Interval H&P Note (Signed)
History and Physical Interval Note:  06/13/2017 12:50 AM  Derrick [name]  has presented today for surgery, with the diagnosis of gangrene toes  The various methods of treatment have been discussed with the patient and family. After consideration of risks, benefits and other options for treatment, the patient has consented to  Procedure(s): AMPUTATION TOE 1st and 2nd (Right) as a surgical intervention .  The patient's history has been reviewed, patient examined, no change in status, stable for surgery.  I have reviewed the patient's chart and labs.  Questions were answered to the patient's satisfaction.     [name]

## 2017-06-13 NOTE — Transfer of Care (Signed)
Immediate Anesthesia Transfer of Care Note  Patient: Derrick [name]  Procedure(s) Performed: Procedure(s): AMPUTATION TOE 1st and 2nd (Right)  Patient Location: PACU  Anesthesia Type:General and GA combined with regional for post-op pain  Level of Consciousness: oriented, sedated, drowsy, patient cooperative and responds to stimulation  Airway & Oxygen Therapy: Patient Spontanous Breathing and Patient connected to nasal cannula oxygen  Post-op Assessment: Report given to RN, Post -op Vital signs reviewed and stable and Patient moving all extremities X 4  Post vital signs: Reviewed and stable  Last Vitals:  Vitals:   06/12/17 1430 06/12/17 2204  BP: 114/78 121/79  Pulse: (!) 47 92  Resp:  18  Temp:  36.9 C  SpO2: 93% 96%    Last Pain:  Vitals:   06/12/17 2204  TempSrc: Oral  PainSc:       Patients Stated Pain Goal: 0 (93/57/01 7793)  Complications: No apparent anesthesia complications

## 2017-06-13 NOTE — Progress Notes (Signed)
Pt converted to afib. MD made aware. Will continue to assess.

## 2017-06-13 NOTE — Addendum Note (Signed)
Addended by: Harriett Sine D on: 06/13/2017 11:04 AM   Modules accepted: Orders

## 2017-06-13 NOTE — Progress Notes (Signed)
Progress Note  Patient Name: Derrick [name] Date of Encounter: 06/13/2017  Primary Cardiologist: Croitoru  Subjective   No CP no SOB  Inpatient Medications    Scheduled Meds: . Chlorhexidine Gluconate Cloth  6 each Topical Daily  . finasteride  5 mg Oral Daily  . losartan  50 mg Oral Daily   And  . hydrochlorothiazide  6.25 mg Oral Daily  . mupirocin ointment  1 application Nasal BID  . pantoprazole  40 mg Oral BID  . [START ON 06/14/2017] predniSONE  5 mg Oral Q breakfast   Continuous Infusions: . sodium chloride 50 mL/hr at 06/12/17 1815  . piperacillin-tazobactam (ZOSYN)  IV Stopped (06/13/17 0817)  . vancomycin     PRN Meds: acetaminophen **OR** acetaminophen, ketorolac, ondansetron **OR** ondansetron (ZOFRAN) IV   Vital Signs    Vitals:   06/13/17 0417 06/13/17 0431 06/13/17 0446 06/13/17 0614  BP: 103/65 (!) 115/56 125/63 135/87  Pulse: 72 68 (!) 51 88  Resp:    16  Temp:    97.8 F (36.6 C)  TempSrc:    Oral  SpO2: 95% 90% 92% 96%    Intake/Output Summary (Last 24 hours) at 06/13/17 1112 Last data filed at 06/13/17 0153  Gross per 24 hour  Intake             1000 ml  Output               10 ml  Net              990 ml   There were no vitals filed for this visit.  Telemetry    No adverse rhythms - Personally Reviewed    Physical Exam   GEN: No acute distress.   Neck: No JVD Cardiac: RRR, no murmurs, rubs, or gallops.  Respiratory: Clear to auscultation bilaterally. GI: Soft, nontender, non-distended  MS: No edema; post op amputation toes Neuro:  Nonfocal  Psych: Normal affect   Labs    Chemistry Recent Labs Lab 06/12/17 1026 06/13/17 0457  NA 130* 133*  K 4.2 4.6  CL 99* 101  CO2 20* 24  GLUCOSE 107* 104*  BUN 12 16  CREATININE 1.22 1.38*  CALCIUM 9.6 9.2  PROT 6.8  --   ALBUMIN 3.5  --   AST 19  --   ALT 17  --   ALKPHOS 50  --   BILITOT 1.0  --   GFRNONAA 53* 46*  GFRAA >60 53*  ANIONGAP 11 8     Hematology Recent  Labs Lab 06/12/17 1026 06/13/17 0457  WBC 10.2 8.3  RBC 4.29 4.19*  HGB 12.4* 12.4*  HCT 36.8* 36.7*  MCV 85.8 87.6  MCH 28.9 29.6  MCHC 33.7 33.8  RDW 14.3 14.7  PLT 212 206    Cardiac EnzymesNo results for input(s): TROPONINI in the last 168 hours. No results for input(s): TROPIPOC in the last 168 hours.   BNPNo results for input(s): BNP, PROBNP in the last 168 hours.   DDimer No results for input(s): DDIMER in the last 168 hours.   Radiology    Dg Chest 2 View  Result Date: 06/12/2017 CLINICAL DATA:  Ulceration of the first and second toes. Preoperative study prior to foot amputation. EXAM: CHEST  2 VIEW COMPARISON:  Chest x-ray of December 05 2016 FINDINGS: The lungs are adequately inflated. There is no focal infiltrate. The interstitial markings are mildly prominent consistent with the patient's smoking history. The heart and  pulmonary vascularity are normal. There is calcification in the wall of the aortic arch. There is no pleural effusion. There is multilevel degenerative disc disease of the thoracic spine. IMPRESSION: Mild chronic bronchitic-smoking related changes, stable. No CHF nor other acute cardiopulmonary abnormality. Thoracic aortic atherosclerosis. Electronically Signed   By: David  Martinique M.D.   On: 06/12/2017 11:02   Dg Foot 2 Views Right  Result Date: 06/13/2017 CLINICAL DATA:  83 y/o  M; first and second toe amputation. EXAM: RIGHT FOOT - 2 VIEW COMPARISON:  06/12/2017 foot radiographs FINDINGS: Postsurgical changes related to the first and second digit amputation at the metatarsophalangeal joints. Extensive vascular calcification. No acute fracture or dislocation identified. Lisfranc alignment is maintained. IMPRESSION: Postsurgical changes related of first and second digit amputation at the metatarsophalangeal joints. Electronically Signed   By: Kristine Garbe M.D.   On: 06/13/2017 02:47   Dg Foot 2 Views Right  Result Date: 06/12/2017 CLINICAL DATA:   Infected/ ulcerated first and second toe. Preop for amputation. EXAM: RIGHT FOOT - 2 VIEW COMPARISON:  06/06/2017 FINDINGS: Stable compared to prior. There is no evidence of fracture, bony erosion, or dislocation. First MTP osteoarthritis with joint narrowing and spurring. Arterial calcification. Osteopenia. No opaque foreign body. IMPRESSION: Stable compared 06/06/2017.  No acute or erosive finding. Electronically Signed   By: Monte Fantasia M.D.   On: 06/12/2017 11:02    Cardiac Studies   Prior ECG NSR TWI lateral unchanged  Patient Profile     83 y.o. male with gangrene of toes post amputation with stable CAD  Assessment & Plan    Gangrene   - post amputation, note reviewed  CAD  - non obstructive, stable, able to complete 4 METS prior to surgery  HTN  - stable, continue home meds  Will sign off. Did well post op. Please let us know if we can be of further assistance  Candee Furbish, MD   06/13/2017, 11:12 AM

## 2017-06-14 ENCOUNTER — Encounter: Payer: Self-pay | Admitting: Podiatry

## 2017-06-14 ENCOUNTER — Ambulatory Visit: Payer: Medicare Other | Admitting: Cardiovascular Disease

## 2017-06-14 ENCOUNTER — Telehealth: Payer: Self-pay | Admitting: Podiatry

## 2017-06-14 ENCOUNTER — Encounter: Payer: Medicare Other | Admitting: Podiatry

## 2017-06-14 ENCOUNTER — Telehealth: Payer: Self-pay | Admitting: *Deleted

## 2017-06-14 ENCOUNTER — Encounter (HOSPITAL_COMMUNITY): Payer: Self-pay | Admitting: Podiatry

## 2017-06-14 DIAGNOSIS — Z9889 Other specified postprocedural states: Secondary | ICD-10-CM

## 2017-06-14 NOTE — Telephone Encounter (Signed)
-----   Message from Patsy Baltimore sent at 06/13/2017  3:51 PM EDT ----- Our onsite staff is working on getting in touch with pt to discuss. Thanks.   Angie  ----- Message ----- From: Andres Ege, RN Sent: 06/13/2017   3:46 PM To: Patsy Baltimore  Sounds great! ----- Message ----- From: Patsy [place] Sent: 06/13/2017  12:47 PM To: Andres Ege, RN, Letitia Neri,  I have pulled the referral. Unfortunately pt's Medicare replacement plan doesn't cover a knee scooter. He can rent one from [place] privately at a discounted rate. ($80 per month)  We can certainly contact him to discuss this.   Is pt discharging today? Do you know the best number to reach him while in hospital to discuss?   Let me know. Thanks.   Angie  ----- Message ----- From: Andres Ege, RN Sent: 06/13/2017  11:22 AM To: Patsy [place], Everett Graff, put in order for knee scooter, pt is in hospital now. Marcy Siren

## 2017-06-14 NOTE — Telephone Encounter (Signed)
Received a phone call at 10:17 PM on Wednesday night, September 5 . Darlene the nurse at the [facility] operating room called in regards to Derrick [name].  She needed the phone number for Dr. [name] concerning this patient's surgery.  I was asleep and did not receive the message.  I called her back at 5 AM in the morning and she said she had located a phone number for Dr. [name] and that everything had been taking care of.  She thanked me for my return call and told me to have a good day.  Gardiner Barefoot DPM

## 2017-06-17 ENCOUNTER — Telehealth: Payer: Self-pay | Admitting: Podiatry

## 2017-06-17 MED ORDER — HYDROCODONE-ACETAMINOPHEN 5-325 MG PO TABS: 1.0000 | ORAL_TABLET | Freq: Four times a day (QID) | ORAL | 0 refills | Status: DC | PRN

## 2017-06-17 NOTE — Addendum Note (Signed)
Addended by: Harriett Sine D on: 06/17/2017 01:26 PM   Modules accepted: Orders

## 2017-06-17 NOTE — Anesthesia Postprocedure Evaluation (Signed)
Anesthesia Post Note  Patient: Derrick [name]  Procedure(s) Performed: Procedure(s) (LRB): AMPUTATION TOE 1st and 2nd (Right)     Patient location during evaluation: PACU Anesthesia Type: General Level of consciousness: awake and alert Pain management: pain level controlled Vital Signs Assessment: post-procedure vital signs reviewed and stable Respiratory status: spontaneous breathing, nonlabored ventilation, respiratory function stable and patient connected to nasal cannula oxygen Cardiovascular status: blood pressure returned to baseline and stable Postop Assessment: no signs of nausea or vomiting Anesthetic complications: no    Last Vitals:  Vitals:   06/13/17 0446 06/13/17 0614  BP: 125/63 135/87  Pulse: (!) 51 88  Resp:  16  Temp:  36.6 C  SpO2: 92% 96%    Last Pain:  Vitals:   06/13/17 1439  TempSrc:   PainSc: 10-Worst pain ever                 Pietra Zuluaga S

## 2017-06-17 NOTE — Telephone Encounter (Addendum)
Pt states he has Norco from Dr. Quincy Simmonds and another medication. I told pt that I would contact Dr. [name] for the pain medication and let him know that he was continuing to have redness and swelling of the surgery foot. I informed pt that Dr. [name] had ordered the [facility] 5/325mg  #20 1-2 tablets every 6 hours prn foot pain, and to continue the antibiotic and if the foot was more red, swollen or he felt bad we would get him in earlier than appt 06/19/2017. Pt stated the foot was no worse, and would be by to pick up the pain medication.

## 2017-06-17 NOTE — Telephone Encounter (Signed)
We can refill his Norco if it's helping. He shouldn't need any additional doxycycline.  If the area is more red than normal or he feels sick we can have him see someone sooner

## 2017-06-17 NOTE — Telephone Encounter (Signed)
Pt called back checking on the status of his pain medication getting refilled. I told the pt I sent the message earlier this morning to our RN, Marcy Siren and that she would contact Dr. [name] who is working in our [employer] office today. I said once she hears back from him we would be back in touch with him. Pt said thank you.

## 2017-06-17 NOTE — Telephone Encounter (Signed)
Pt called requesting a refill of pain medication. States he doesn't have enough and that his foot is still red and swollen.

## 2017-06-19 ENCOUNTER — Encounter: Payer: Self-pay | Admitting: Podiatry

## 2017-06-19 ENCOUNTER — Ambulatory Visit (INDEPENDENT_AMBULATORY_CARE_PROVIDER_SITE_OTHER): Payer: Medicare Other

## 2017-06-19 ENCOUNTER — Ambulatory Visit (INDEPENDENT_AMBULATORY_CARE_PROVIDER_SITE_OTHER): Payer: Self-pay | Admitting: Podiatry

## 2017-06-19 DIAGNOSIS — M009 Pyogenic arthritis, unspecified: Secondary | ICD-10-CM

## 2017-06-19 DIAGNOSIS — L03115 Cellulitis of right lower limb: Secondary | ICD-10-CM | POA: Diagnosis not present

## 2017-06-19 DIAGNOSIS — Z9889 Other specified postprocedural states: Secondary | ICD-10-CM

## 2017-06-19 NOTE — Progress Notes (Signed)
   Subjective:    Patient ID: TAJAE RYBICKI, male    DOB: 1932-09-06, 83 y.o.   MRN: [id number]  HPI Chief Complaint  Patient presents with  . Routine Post Op    i had surgery on 06-12-2017 and i am doing ok on the right foot    83 y.o. male returns for the above complaint. Came in today without a dressing on his foot, walking without a surgical shoe on and without assistive device. Denies N/V/F/Ch.   Review of Systems     Objective:   Physical Exam There were no vitals filed for this visit. General AA&O x3. Normal mood and affect.  Vascular R Foot warm and well perfused. Pedal pulses non-palpable. Dependent rubor noted. R forefoot edema.  Neurologic Epicritic sensation grossly present.  Dermatologic Amputation site healing with some areas of fibrotic tissue and some areas of eschar along the incision lines. Adjacent to dorsal incision is a area of purple dusky tissue with sluggish capillary refill. Otherwise all areas with good capillary refill.   Orthopedic: Pain to palpation about the surgical site.   Radiographs: S/p Amputation R 1st and 2nd toes. No interval changes.     Assessment & Plan:  s/p R Foot 1st/2nd Toe Amputation, ~1 week -Patient came in today without a dressing on his foot. Discussed the importance of keeping the amputation site clean and dry. As patient does not seem to be taking adequate care of his wound himself placed referral to home health care. Recommend Betadine WTD dressings 3x weekly. -Betadine WTD dressing placed today. -Discussed importance of keeping weight off of his foot with a surgical shoe. Will also call about knee scooter for patient as previously placed. -Vicodin Rx filled earlier this week, too early for refill.

## 2017-06-20 NOTE — Telephone Encounter (Addendum)
Dr. [name] ordered betadine wet to dry dressing to right foot 3 times week for 4 weeks. Faxed required form, clinicals and demographics to [place].

## 2017-06-21 ENCOUNTER — Encounter: Payer: Medicare Other | Admitting: Podiatry

## 2017-06-24 ENCOUNTER — Ambulatory Visit (INDEPENDENT_AMBULATORY_CARE_PROVIDER_SITE_OTHER): Payer: Self-pay | Admitting: Podiatry

## 2017-06-24 ENCOUNTER — Ambulatory Visit (INDEPENDENT_AMBULATORY_CARE_PROVIDER_SITE_OTHER): Payer: Medicare Other

## 2017-06-24 ENCOUNTER — Encounter: Payer: Self-pay | Admitting: Podiatry

## 2017-06-24 VITALS — BP 120/59 | HR 78 | Temp 97.8°F | Resp 18

## 2017-06-24 DIAGNOSIS — M009 Pyogenic arthritis, unspecified: Secondary | ICD-10-CM | POA: Diagnosis not present

## 2017-06-24 MED ORDER — DOXYCYCLINE HYCLATE 100 MG PO TABS: 100.0000 mg | ORAL_TABLET | Freq: Two times a day (BID) | ORAL | 0 refills | Status: DC

## 2017-06-24 NOTE — Progress Notes (Signed)
Subjective: Derrick [name] is a 83 y.o. is seen today in office s/p right hallux and 2nd digit amputation preformed on 06-12-2017 with Dr. [name]. He presents today for a walk-in appointment due to pain to the foot. He has been wearing a surgical shoe. Denies any systemic complaints such as fevers, chills, nausea, vomiting. No calf pain, chest pain, shortness of breath.   Objective: General: No acute distress, AAOx3  DP/PT pulses palpable 2/4, CRT < 3 sec to all digits.  Right foot: Incision is coapted with sutures intact but there is some movement across the incision. There is an eschar over the amputation site to the hallux amputation. This fibrotic tissue along the incision sites. There is erythema along the surgical site but there is no ascending cellulitis. There is no areas of flexions or crepitus. There is mild swelling. There is no malodor. No other open lesions or pre-ulcerative lesions.  No pain with calf compression, swelling, warmth, erythema.         Assessment and Plan:  Status post amputation with erythema  -Treatment options discussed including all alternatives, risks, and complications -X-rays were obtained and reviewed. As no evidence of acute osteomyelitis there is no soft tissue emphysema present. -Betadine was applied followed by a bandage. -Prescribed doxycycline -Elevation -Pain medication as needed. -Monitor for any clinical signs or symptoms of infection and DVT/PE and directed to call the office immediately should any occur or go to the ER. -Follow-up as scheduled with Dr. [name] or sooner if any problems arise. In the meantime, encouraged to call the office with any questions, concerns, change in symptoms.   Celesta Gentile, DPM

## 2017-06-25 ENCOUNTER — Inpatient Hospital Stay: Admission: RE | Admit: 2017-06-25 | Payer: Medicare Other | Source: Ambulatory Visit

## 2017-06-25 DIAGNOSIS — J449 Chronic obstructive pulmonary disease, unspecified: Secondary | ICD-10-CM | POA: Diagnosis not present

## 2017-06-25 DIAGNOSIS — N400 Enlarged prostate without lower urinary tract symptoms: Secondary | ICD-10-CM | POA: Diagnosis not present

## 2017-06-25 DIAGNOSIS — Z89421 Acquired absence of other right toe(s): Secondary | ICD-10-CM | POA: Diagnosis not present

## 2017-06-25 DIAGNOSIS — Z89411 Acquired absence of right great toe: Secondary | ICD-10-CM | POA: Diagnosis not present

## 2017-06-25 DIAGNOSIS — Z4781 Encounter for orthopedic aftercare following surgical amputation: Secondary | ICD-10-CM | POA: Diagnosis not present

## 2017-06-25 DIAGNOSIS — N183 Chronic kidney disease, stage 3 (moderate): Secondary | ICD-10-CM | POA: Diagnosis not present

## 2017-06-25 DIAGNOSIS — Z9181 History of falling: Secondary | ICD-10-CM | POA: Diagnosis not present

## 2017-06-25 DIAGNOSIS — D631 Anemia in chronic kidney disease: Secondary | ICD-10-CM | POA: Diagnosis not present

## 2017-06-25 DIAGNOSIS — I2510 Atherosclerotic heart disease of native coronary artery without angina pectoris: Secondary | ICD-10-CM | POA: Diagnosis not present

## 2017-06-25 DIAGNOSIS — I129 Hypertensive chronic kidney disease with stage 1 through stage 4 chronic kidney disease, or unspecified chronic kidney disease: Secondary | ICD-10-CM | POA: Diagnosis not present

## 2017-06-25 DIAGNOSIS — K219 Gastro-esophageal reflux disease without esophagitis: Secondary | ICD-10-CM | POA: Diagnosis not present

## 2017-06-26 ENCOUNTER — Telehealth: Payer: Self-pay | Admitting: Podiatry

## 2017-06-26 ENCOUNTER — Encounter: Payer: Self-pay | Admitting: Podiatry

## 2017-06-26 ENCOUNTER — Ambulatory Visit (INDEPENDENT_AMBULATORY_CARE_PROVIDER_SITE_OTHER): Payer: Medicare Other | Admitting: Podiatry

## 2017-06-26 VITALS — BP 123/84 | HR 102 | Resp 18

## 2017-06-26 DIAGNOSIS — Z9889 Other specified postprocedural states: Secondary | ICD-10-CM

## 2017-06-26 MED ORDER — HYDROCODONE-ACETAMINOPHEN 5-325 MG PO TABS: 1.0000 | ORAL_TABLET | Freq: Four times a day (QID) | ORAL | 0 refills | Status: DC | PRN

## 2017-06-26 NOTE — Telephone Encounter (Signed)
I spoke with Tiffany - [facility], she asked for continuing services for wound care. I okayed orders for [facility] to continue wound care for pt.

## 2017-06-26 NOTE — Telephone Encounter (Signed)
This is Tiffany with [facility]. Can someone please call me back in regards to Mr. Michelin at [phone].

## 2017-06-27 ENCOUNTER — Ambulatory Visit: Payer: Medicare Other | Admitting: Podiatry

## 2017-06-27 DIAGNOSIS — J449 Chronic obstructive pulmonary disease, unspecified: Secondary | ICD-10-CM | POA: Diagnosis not present

## 2017-06-27 DIAGNOSIS — K219 Gastro-esophageal reflux disease without esophagitis: Secondary | ICD-10-CM | POA: Diagnosis not present

## 2017-06-27 DIAGNOSIS — I129 Hypertensive chronic kidney disease with stage 1 through stage 4 chronic kidney disease, or unspecified chronic kidney disease: Secondary | ICD-10-CM | POA: Diagnosis not present

## 2017-06-27 DIAGNOSIS — Z89411 Acquired absence of right great toe: Secondary | ICD-10-CM | POA: Diagnosis not present

## 2017-06-27 DIAGNOSIS — Z4781 Encounter for orthopedic aftercare following surgical amputation: Secondary | ICD-10-CM | POA: Diagnosis not present

## 2017-06-27 DIAGNOSIS — N183 Chronic kidney disease, stage 3 (moderate): Secondary | ICD-10-CM | POA: Diagnosis not present

## 2017-06-27 DIAGNOSIS — I2510 Atherosclerotic heart disease of native coronary artery without angina pectoris: Secondary | ICD-10-CM | POA: Diagnosis not present

## 2017-06-27 DIAGNOSIS — N400 Enlarged prostate without lower urinary tract symptoms: Secondary | ICD-10-CM | POA: Diagnosis not present

## 2017-06-27 DIAGNOSIS — D631 Anemia in chronic kidney disease: Secondary | ICD-10-CM | POA: Diagnosis not present

## 2017-06-27 DIAGNOSIS — Z89421 Acquired absence of other right toe(s): Secondary | ICD-10-CM | POA: Diagnosis not present

## 2017-06-27 DIAGNOSIS — Z9181 History of falling: Secondary | ICD-10-CM | POA: Diagnosis not present

## 2017-06-28 ENCOUNTER — Other Ambulatory Visit: Payer: Self-pay | Admitting: Interventional Radiology

## 2017-06-28 DIAGNOSIS — I96 Gangrene, not elsewhere classified: Secondary | ICD-10-CM

## 2017-06-28 DIAGNOSIS — I739 Peripheral vascular disease, unspecified: Secondary | ICD-10-CM

## 2017-07-01 ENCOUNTER — Ambulatory Visit: Payer: Medicare Other | Admitting: Cardiovascular Disease

## 2017-07-01 DIAGNOSIS — N400 Enlarged prostate without lower urinary tract symptoms: Secondary | ICD-10-CM | POA: Diagnosis not present

## 2017-07-01 DIAGNOSIS — I129 Hypertensive chronic kidney disease with stage 1 through stage 4 chronic kidney disease, or unspecified chronic kidney disease: Secondary | ICD-10-CM | POA: Diagnosis not present

## 2017-07-01 DIAGNOSIS — Z9181 History of falling: Secondary | ICD-10-CM | POA: Diagnosis not present

## 2017-07-01 DIAGNOSIS — J449 Chronic obstructive pulmonary disease, unspecified: Secondary | ICD-10-CM | POA: Diagnosis not present

## 2017-07-01 DIAGNOSIS — D631 Anemia in chronic kidney disease: Secondary | ICD-10-CM | POA: Diagnosis not present

## 2017-07-01 DIAGNOSIS — Z89421 Acquired absence of other right toe(s): Secondary | ICD-10-CM | POA: Diagnosis not present

## 2017-07-01 DIAGNOSIS — Z89411 Acquired absence of right great toe: Secondary | ICD-10-CM | POA: Diagnosis not present

## 2017-07-01 DIAGNOSIS — Z4781 Encounter for orthopedic aftercare following surgical amputation: Secondary | ICD-10-CM | POA: Diagnosis not present

## 2017-07-01 DIAGNOSIS — K219 Gastro-esophageal reflux disease without esophagitis: Secondary | ICD-10-CM | POA: Diagnosis not present

## 2017-07-01 DIAGNOSIS — N183 Chronic kidney disease, stage 3 (moderate): Secondary | ICD-10-CM | POA: Diagnosis not present

## 2017-07-01 DIAGNOSIS — I2510 Atherosclerotic heart disease of native coronary artery without angina pectoris: Secondary | ICD-10-CM | POA: Diagnosis not present

## 2017-07-03 DIAGNOSIS — Z4781 Encounter for orthopedic aftercare following surgical amputation: Secondary | ICD-10-CM | POA: Diagnosis not present

## 2017-07-03 DIAGNOSIS — N183 Chronic kidney disease, stage 3 (moderate): Secondary | ICD-10-CM | POA: Diagnosis not present

## 2017-07-03 DIAGNOSIS — N400 Enlarged prostate without lower urinary tract symptoms: Secondary | ICD-10-CM | POA: Diagnosis not present

## 2017-07-03 DIAGNOSIS — Z89411 Acquired absence of right great toe: Secondary | ICD-10-CM | POA: Diagnosis not present

## 2017-07-03 DIAGNOSIS — K219 Gastro-esophageal reflux disease without esophagitis: Secondary | ICD-10-CM | POA: Diagnosis not present

## 2017-07-03 DIAGNOSIS — I2510 Atherosclerotic heart disease of native coronary artery without angina pectoris: Secondary | ICD-10-CM | POA: Diagnosis not present

## 2017-07-03 DIAGNOSIS — D631 Anemia in chronic kidney disease: Secondary | ICD-10-CM | POA: Diagnosis not present

## 2017-07-03 DIAGNOSIS — Z9181 History of falling: Secondary | ICD-10-CM | POA: Diagnosis not present

## 2017-07-03 DIAGNOSIS — I129 Hypertensive chronic kidney disease with stage 1 through stage 4 chronic kidney disease, or unspecified chronic kidney disease: Secondary | ICD-10-CM | POA: Diagnosis not present

## 2017-07-03 DIAGNOSIS — Z89421 Acquired absence of other right toe(s): Secondary | ICD-10-CM | POA: Diagnosis not present

## 2017-07-03 DIAGNOSIS — J449 Chronic obstructive pulmonary disease, unspecified: Secondary | ICD-10-CM | POA: Diagnosis not present

## 2017-07-03 NOTE — Progress Notes (Signed)
   Subjective:    Patient ID: Derrick [name], male    DOB: 1932-06-29, 83 y.o.   MRN: [id number]  HPI 83 year old male presents for f/u of R great and 2nd toe amputations. Having some pain that is controlled by pain medications. Has not been compliant with NWB precautions. Denies N/V/F/Ch. Denies other post-operative issues.  Review of Systems     Objective:   Physical Exam Vitals:   06/26/17 1420  BP: 123/84  Pulse: (!) 102  Resp: 18   General AA&O x3. Normal mood and affect.  Vascular R foot warm.  Neurologic Epicritic sensation grossly present.  Dermatologic Amputation site with overlying areas of mixed fibrosis, soft eschar. Left intact. Serous drainage without purulence. Sutures intact. Local erythema but no ascending cellulitis.   Orthopedic: MMT 5/5 in dorsiflexion, plantarflexion, inversion, and eversion. Normal joint ROM without pain or crepitus.      Assessment & Plan:  S/p R Hallux, R 2nd Toe Amputation -Healing, albeit slowly. -Will consider referral to HBO if wound does not progress. -Sutures left intact. -Betadine WTD placed  Today. Educated on application. -Discussed importance of NWB to the operated foot. Patient has been non-compliant thus far.  Return in about 1 week (around 07/03/2017).

## 2017-07-04 ENCOUNTER — Encounter: Payer: Medicare Other | Admitting: Podiatry

## 2017-07-05 DIAGNOSIS — Z89411 Acquired absence of right great toe: Secondary | ICD-10-CM | POA: Diagnosis not present

## 2017-07-05 DIAGNOSIS — N183 Chronic kidney disease, stage 3 (moderate): Secondary | ICD-10-CM | POA: Diagnosis not present

## 2017-07-05 DIAGNOSIS — J449 Chronic obstructive pulmonary disease, unspecified: Secondary | ICD-10-CM | POA: Diagnosis not present

## 2017-07-05 DIAGNOSIS — I2510 Atherosclerotic heart disease of native coronary artery without angina pectoris: Secondary | ICD-10-CM | POA: Diagnosis not present

## 2017-07-05 DIAGNOSIS — Z89421 Acquired absence of other right toe(s): Secondary | ICD-10-CM | POA: Diagnosis not present

## 2017-07-05 DIAGNOSIS — N400 Enlarged prostate without lower urinary tract symptoms: Secondary | ICD-10-CM | POA: Diagnosis not present

## 2017-07-05 DIAGNOSIS — Z4781 Encounter for orthopedic aftercare following surgical amputation: Secondary | ICD-10-CM | POA: Diagnosis not present

## 2017-07-05 DIAGNOSIS — Z9181 History of falling: Secondary | ICD-10-CM | POA: Diagnosis not present

## 2017-07-05 DIAGNOSIS — K219 Gastro-esophageal reflux disease without esophagitis: Secondary | ICD-10-CM | POA: Diagnosis not present

## 2017-07-05 DIAGNOSIS — I129 Hypertensive chronic kidney disease with stage 1 through stage 4 chronic kidney disease, or unspecified chronic kidney disease: Secondary | ICD-10-CM | POA: Diagnosis not present

## 2017-07-05 DIAGNOSIS — D631 Anemia in chronic kidney disease: Secondary | ICD-10-CM | POA: Diagnosis not present

## 2017-07-08 ENCOUNTER — Telehealth: Payer: Self-pay | Admitting: Podiatry

## 2017-07-08 DIAGNOSIS — Z89421 Acquired absence of other right toe(s): Secondary | ICD-10-CM | POA: Diagnosis not present

## 2017-07-08 DIAGNOSIS — J449 Chronic obstructive pulmonary disease, unspecified: Secondary | ICD-10-CM | POA: Diagnosis not present

## 2017-07-08 DIAGNOSIS — Z89411 Acquired absence of right great toe: Secondary | ICD-10-CM | POA: Diagnosis not present

## 2017-07-08 DIAGNOSIS — I129 Hypertensive chronic kidney disease with stage 1 through stage 4 chronic kidney disease, or unspecified chronic kidney disease: Secondary | ICD-10-CM | POA: Diagnosis not present

## 2017-07-08 DIAGNOSIS — N183 Chronic kidney disease, stage 3 (moderate): Secondary | ICD-10-CM | POA: Diagnosis not present

## 2017-07-08 DIAGNOSIS — Z9181 History of falling: Secondary | ICD-10-CM | POA: Diagnosis not present

## 2017-07-08 DIAGNOSIS — I2510 Atherosclerotic heart disease of native coronary artery without angina pectoris: Secondary | ICD-10-CM | POA: Diagnosis not present

## 2017-07-08 DIAGNOSIS — K219 Gastro-esophageal reflux disease without esophagitis: Secondary | ICD-10-CM | POA: Diagnosis not present

## 2017-07-08 DIAGNOSIS — Z4781 Encounter for orthopedic aftercare following surgical amputation: Secondary | ICD-10-CM | POA: Diagnosis not present

## 2017-07-08 DIAGNOSIS — D631 Anemia in chronic kidney disease: Secondary | ICD-10-CM | POA: Diagnosis not present

## 2017-07-08 DIAGNOSIS — N400 Enlarged prostate without lower urinary tract symptoms: Secondary | ICD-10-CM | POA: Diagnosis not present

## 2017-07-08 MED ORDER — HYDROCODONE-ACETAMINOPHEN 5-325 MG PO TABS: 1.0000 | ORAL_TABLET | Freq: Four times a day (QID) | ORAL | 0 refills | Status: DC | PRN

## 2017-07-08 NOTE — Telephone Encounter (Signed)
I'm calling because I'm almost out of my pain medication and wanted to know if I can get some more. Please call me back at [phone]. Thank you.

## 2017-07-08 NOTE — Telephone Encounter (Signed)
Dr. [name] refilled the [facility] as previously. I informed pt the Norco could be picked up in the [place] office and reminded of the 07/10/2017 appt with Dr. [name] at 2:45pm. Pt states understanding.

## 2017-07-08 NOTE — Addendum Note (Signed)
Addended by: Harriett Sine D on: 07/08/2017 10:33 AM   Modules accepted: Orders

## 2017-07-09 DIAGNOSIS — Z8673 Personal history of transient ischemic attack (TIA), and cerebral infarction without residual deficits: Secondary | ICD-10-CM | POA: Diagnosis not present

## 2017-07-09 DIAGNOSIS — Z905 Acquired absence of kidney: Secondary | ICD-10-CM | POA: Diagnosis not present

## 2017-07-09 DIAGNOSIS — K219 Gastro-esophageal reflux disease without esophagitis: Secondary | ICD-10-CM | POA: Diagnosis not present

## 2017-07-09 DIAGNOSIS — I129 Hypertensive chronic kidney disease with stage 1 through stage 4 chronic kidney disease, or unspecified chronic kidney disease: Secondary | ICD-10-CM | POA: Diagnosis not present

## 2017-07-09 DIAGNOSIS — Z85528 Personal history of other malignant neoplasm of kidney: Secondary | ICD-10-CM | POA: Diagnosis not present

## 2017-07-09 DIAGNOSIS — Z8249 Family history of ischemic heart disease and other diseases of the circulatory system: Secondary | ICD-10-CM | POA: Diagnosis not present

## 2017-07-09 DIAGNOSIS — G4700 Insomnia, unspecified: Secondary | ICD-10-CM | POA: Diagnosis not present

## 2017-07-09 DIAGNOSIS — N189 Chronic kidney disease, unspecified: Secondary | ICD-10-CM | POA: Diagnosis not present

## 2017-07-09 DIAGNOSIS — I70261 Atherosclerosis of native arteries of extremities with gangrene, right leg: Secondary | ICD-10-CM | POA: Diagnosis not present

## 2017-07-09 DIAGNOSIS — E7800 Pure hypercholesterolemia, unspecified: Secondary | ICD-10-CM | POA: Diagnosis not present

## 2017-07-09 DIAGNOSIS — Z87891 Personal history of nicotine dependence: Secondary | ICD-10-CM | POA: Diagnosis not present

## 2017-07-09 DIAGNOSIS — N400 Enlarged prostate without lower urinary tract symptoms: Secondary | ICD-10-CM | POA: Diagnosis not present

## 2017-07-09 DIAGNOSIS — M059 Rheumatoid arthritis with rheumatoid factor, unspecified: Secondary | ICD-10-CM | POA: Diagnosis not present

## 2017-07-09 DIAGNOSIS — J449 Chronic obstructive pulmonary disease, unspecified: Secondary | ICD-10-CM | POA: Diagnosis not present

## 2017-07-09 DIAGNOSIS — I2510 Atherosclerotic heart disease of native coronary artery without angina pectoris: Secondary | ICD-10-CM | POA: Diagnosis not present

## 2017-07-09 DIAGNOSIS — K449 Diaphragmatic hernia without obstruction or gangrene: Secondary | ICD-10-CM | POA: Diagnosis not present

## 2017-07-09 MED ORDER — IODIXANOL 320 MG/ML IV SOLN
150.0000 mL | Freq: Once | INTRAVENOUS | Status: AC | PRN
  Administered 2017-07-09: 125 mL via INTRA_ARTERIAL

## 2017-07-09 NOTE — Addendum Note (Signed)
Encounter addended by: Riley Churches on: 07/09/2017  1:29 PM<BR>    Actions taken: Imaging Exam ended, Order list changed, MAR administration accepted

## 2017-07-10 ENCOUNTER — Encounter: Payer: Self-pay | Admitting: Podiatry

## 2017-07-10 ENCOUNTER — Ambulatory Visit (INDEPENDENT_AMBULATORY_CARE_PROVIDER_SITE_OTHER): Payer: Medicare Other

## 2017-07-10 ENCOUNTER — Ambulatory Visit (INDEPENDENT_AMBULATORY_CARE_PROVIDER_SITE_OTHER): Payer: Medicare Other | Admitting: Podiatry

## 2017-07-10 VITALS — BP 90/61 | HR 98 | Temp 99.6°F | Resp 18

## 2017-07-10 DIAGNOSIS — D631 Anemia in chronic kidney disease: Secondary | ICD-10-CM | POA: Diagnosis not present

## 2017-07-10 DIAGNOSIS — Z9889 Other specified postprocedural states: Secondary | ICD-10-CM

## 2017-07-10 DIAGNOSIS — I2510 Atherosclerotic heart disease of native coronary artery without angina pectoris: Secondary | ICD-10-CM | POA: Diagnosis not present

## 2017-07-10 DIAGNOSIS — Z89421 Acquired absence of other right toe(s): Secondary | ICD-10-CM | POA: Diagnosis not present

## 2017-07-10 DIAGNOSIS — Z9181 History of falling: Secondary | ICD-10-CM | POA: Diagnosis not present

## 2017-07-10 DIAGNOSIS — K219 Gastro-esophageal reflux disease without esophagitis: Secondary | ICD-10-CM | POA: Diagnosis not present

## 2017-07-10 DIAGNOSIS — I739 Peripheral vascular disease, unspecified: Secondary | ICD-10-CM

## 2017-07-10 DIAGNOSIS — N183 Chronic kidney disease, stage 3 (moderate): Secondary | ICD-10-CM | POA: Diagnosis not present

## 2017-07-10 DIAGNOSIS — Z89411 Acquired absence of right great toe: Secondary | ICD-10-CM | POA: Diagnosis not present

## 2017-07-10 DIAGNOSIS — J449 Chronic obstructive pulmonary disease, unspecified: Secondary | ICD-10-CM | POA: Diagnosis not present

## 2017-07-10 DIAGNOSIS — I129 Hypertensive chronic kidney disease with stage 1 through stage 4 chronic kidney disease, or unspecified chronic kidney disease: Secondary | ICD-10-CM | POA: Diagnosis not present

## 2017-07-10 DIAGNOSIS — N400 Enlarged prostate without lower urinary tract symptoms: Secondary | ICD-10-CM | POA: Diagnosis not present

## 2017-07-10 DIAGNOSIS — I96 Gangrene, not elsewhere classified: Secondary | ICD-10-CM | POA: Diagnosis not present

## 2017-07-10 DIAGNOSIS — Z4781 Encounter for orthopedic aftercare following surgical amputation: Secondary | ICD-10-CM | POA: Diagnosis not present

## 2017-07-11 ENCOUNTER — Telehealth: Payer: Self-pay | Admitting: *Deleted

## 2017-07-11 NOTE — Telephone Encounter (Addendum)
Faxed required form, clinicals and demographics to [place]. Faxed required form, clinicals and demographics to [place] [employer].07/25/2017-Andrea - [facility] states pt is using Santyl on wound but their last wound care instructions were betadine wet to dry dressing, please clarify.

## 2017-07-12 DIAGNOSIS — Z89411 Acquired absence of right great toe: Secondary | ICD-10-CM | POA: Diagnosis not present

## 2017-07-12 DIAGNOSIS — N183 Chronic kidney disease, stage 3 (moderate): Secondary | ICD-10-CM | POA: Diagnosis not present

## 2017-07-12 DIAGNOSIS — I2510 Atherosclerotic heart disease of native coronary artery without angina pectoris: Secondary | ICD-10-CM | POA: Diagnosis not present

## 2017-07-12 DIAGNOSIS — K219 Gastro-esophageal reflux disease without esophagitis: Secondary | ICD-10-CM | POA: Diagnosis not present

## 2017-07-12 DIAGNOSIS — D631 Anemia in chronic kidney disease: Secondary | ICD-10-CM | POA: Diagnosis not present

## 2017-07-12 DIAGNOSIS — Z89421 Acquired absence of other right toe(s): Secondary | ICD-10-CM | POA: Diagnosis not present

## 2017-07-12 DIAGNOSIS — Z4781 Encounter for orthopedic aftercare following surgical amputation: Secondary | ICD-10-CM | POA: Diagnosis not present

## 2017-07-12 DIAGNOSIS — N400 Enlarged prostate without lower urinary tract symptoms: Secondary | ICD-10-CM | POA: Diagnosis not present

## 2017-07-12 DIAGNOSIS — J449 Chronic obstructive pulmonary disease, unspecified: Secondary | ICD-10-CM | POA: Diagnosis not present

## 2017-07-12 DIAGNOSIS — Z9181 History of falling: Secondary | ICD-10-CM | POA: Diagnosis not present

## 2017-07-12 DIAGNOSIS — I129 Hypertensive chronic kidney disease with stage 1 through stage 4 chronic kidney disease, or unspecified chronic kidney disease: Secondary | ICD-10-CM | POA: Diagnosis not present

## 2017-07-15 ENCOUNTER — Telehealth: Payer: Self-pay | Admitting: Podiatry

## 2017-07-15 DIAGNOSIS — N400 Enlarged prostate without lower urinary tract symptoms: Secondary | ICD-10-CM | POA: Diagnosis not present

## 2017-07-15 DIAGNOSIS — J449 Chronic obstructive pulmonary disease, unspecified: Secondary | ICD-10-CM | POA: Diagnosis not present

## 2017-07-15 DIAGNOSIS — Z4781 Encounter for orthopedic aftercare following surgical amputation: Secondary | ICD-10-CM | POA: Diagnosis not present

## 2017-07-15 DIAGNOSIS — Z9181 History of falling: Secondary | ICD-10-CM | POA: Diagnosis not present

## 2017-07-15 DIAGNOSIS — I129 Hypertensive chronic kidney disease with stage 1 through stage 4 chronic kidney disease, or unspecified chronic kidney disease: Secondary | ICD-10-CM | POA: Diagnosis not present

## 2017-07-15 DIAGNOSIS — I2510 Atherosclerotic heart disease of native coronary artery without angina pectoris: Secondary | ICD-10-CM | POA: Diagnosis not present

## 2017-07-15 DIAGNOSIS — Z89421 Acquired absence of other right toe(s): Secondary | ICD-10-CM | POA: Diagnosis not present

## 2017-07-15 DIAGNOSIS — N183 Chronic kidney disease, stage 3 (moderate): Secondary | ICD-10-CM | POA: Diagnosis not present

## 2017-07-15 DIAGNOSIS — K219 Gastro-esophageal reflux disease without esophagitis: Secondary | ICD-10-CM | POA: Diagnosis not present

## 2017-07-15 DIAGNOSIS — Z89411 Acquired absence of right great toe: Secondary | ICD-10-CM | POA: Diagnosis not present

## 2017-07-15 DIAGNOSIS — D631 Anemia in chronic kidney disease: Secondary | ICD-10-CM | POA: Diagnosis not present

## 2017-07-15 NOTE — Telephone Encounter (Signed)
Patient needs refill on pain meds. Please advise

## 2017-07-16 MED ORDER — HYDROCODONE-ACETAMINOPHEN 5-325 MG PO TABS: 1.0000 | ORAL_TABLET | Freq: Four times a day (QID) | ORAL | 0 refills | Status: DC | PRN

## 2017-07-16 NOTE — Telephone Encounter (Addendum)
Left message informing pt Dr. [name] had ordered refill of the [place] 5/319m as previously written and to remember to discuss his pain with Dr. PMarch Rummageat the 07/19/2017 appt. Met pt and wife in parking lot and gave rx.

## 2017-07-16 NOTE — Addendum Note (Signed)
Addended by: Harriett Sine D on: 07/16/2017 11:14 AM   Modules accepted: Orders

## 2017-07-16 NOTE — Progress Notes (Signed)
DOS 9.6.18 Rt great and 2nd toe amputation

## 2017-07-17 DIAGNOSIS — N400 Enlarged prostate without lower urinary tract symptoms: Secondary | ICD-10-CM | POA: Diagnosis not present

## 2017-07-17 DIAGNOSIS — K219 Gastro-esophageal reflux disease without esophagitis: Secondary | ICD-10-CM | POA: Diagnosis not present

## 2017-07-17 DIAGNOSIS — Z9181 History of falling: Secondary | ICD-10-CM | POA: Diagnosis not present

## 2017-07-17 DIAGNOSIS — I129 Hypertensive chronic kidney disease with stage 1 through stage 4 chronic kidney disease, or unspecified chronic kidney disease: Secondary | ICD-10-CM | POA: Diagnosis not present

## 2017-07-17 DIAGNOSIS — N183 Chronic kidney disease, stage 3 (moderate): Secondary | ICD-10-CM | POA: Diagnosis not present

## 2017-07-17 DIAGNOSIS — Z4781 Encounter for orthopedic aftercare following surgical amputation: Secondary | ICD-10-CM | POA: Diagnosis not present

## 2017-07-17 DIAGNOSIS — J449 Chronic obstructive pulmonary disease, unspecified: Secondary | ICD-10-CM | POA: Diagnosis not present

## 2017-07-17 DIAGNOSIS — D631 Anemia in chronic kidney disease: Secondary | ICD-10-CM | POA: Diagnosis not present

## 2017-07-17 DIAGNOSIS — I2510 Atherosclerotic heart disease of native coronary artery without angina pectoris: Secondary | ICD-10-CM | POA: Diagnosis not present

## 2017-07-17 DIAGNOSIS — Z89421 Acquired absence of other right toe(s): Secondary | ICD-10-CM | POA: Diagnosis not present

## 2017-07-17 DIAGNOSIS — Z89411 Acquired absence of right great toe: Secondary | ICD-10-CM | POA: Diagnosis not present

## 2017-07-18 ENCOUNTER — Ambulatory Visit: Payer: Medicare Other | Admitting: Podiatry

## 2017-07-19 ENCOUNTER — Ambulatory Visit (INDEPENDENT_AMBULATORY_CARE_PROVIDER_SITE_OTHER): Payer: Medicare Other | Admitting: Podiatry

## 2017-07-19 ENCOUNTER — Encounter: Payer: Self-pay | Admitting: Podiatry

## 2017-07-19 VITALS — BP 121/70 | Temp 98.1°F

## 2017-07-19 DIAGNOSIS — Z9889 Other specified postprocedural states: Secondary | ICD-10-CM

## 2017-07-20 DIAGNOSIS — N183 Chronic kidney disease, stage 3 (moderate): Secondary | ICD-10-CM | POA: Diagnosis not present

## 2017-07-20 DIAGNOSIS — I2510 Atherosclerotic heart disease of native coronary artery without angina pectoris: Secondary | ICD-10-CM | POA: Diagnosis not present

## 2017-07-20 DIAGNOSIS — J449 Chronic obstructive pulmonary disease, unspecified: Secondary | ICD-10-CM | POA: Diagnosis not present

## 2017-07-20 DIAGNOSIS — Z4781 Encounter for orthopedic aftercare following surgical amputation: Secondary | ICD-10-CM | POA: Diagnosis not present

## 2017-07-20 DIAGNOSIS — D631 Anemia in chronic kidney disease: Secondary | ICD-10-CM | POA: Diagnosis not present

## 2017-07-20 DIAGNOSIS — Z9181 History of falling: Secondary | ICD-10-CM | POA: Diagnosis not present

## 2017-07-20 DIAGNOSIS — N400 Enlarged prostate without lower urinary tract symptoms: Secondary | ICD-10-CM | POA: Diagnosis not present

## 2017-07-20 DIAGNOSIS — Z89411 Acquired absence of right great toe: Secondary | ICD-10-CM | POA: Diagnosis not present

## 2017-07-20 DIAGNOSIS — Z89421 Acquired absence of other right toe(s): Secondary | ICD-10-CM | POA: Diagnosis not present

## 2017-07-20 DIAGNOSIS — I129 Hypertensive chronic kidney disease with stage 1 through stage 4 chronic kidney disease, or unspecified chronic kidney disease: Secondary | ICD-10-CM | POA: Diagnosis not present

## 2017-07-20 DIAGNOSIS — K219 Gastro-esophageal reflux disease without esophagitis: Secondary | ICD-10-CM | POA: Diagnosis not present

## 2017-07-22 ENCOUNTER — Encounter: Payer: Self-pay | Admitting: Interventional Radiology

## 2017-07-22 DIAGNOSIS — Z9181 History of falling: Secondary | ICD-10-CM | POA: Diagnosis not present

## 2017-07-22 DIAGNOSIS — I2510 Atherosclerotic heart disease of native coronary artery without angina pectoris: Secondary | ICD-10-CM | POA: Diagnosis not present

## 2017-07-22 DIAGNOSIS — I129 Hypertensive chronic kidney disease with stage 1 through stage 4 chronic kidney disease, or unspecified chronic kidney disease: Secondary | ICD-10-CM | POA: Diagnosis not present

## 2017-07-22 DIAGNOSIS — N183 Chronic kidney disease, stage 3 (moderate): Secondary | ICD-10-CM | POA: Diagnosis not present

## 2017-07-22 DIAGNOSIS — Z4781 Encounter for orthopedic aftercare following surgical amputation: Secondary | ICD-10-CM | POA: Diagnosis not present

## 2017-07-22 DIAGNOSIS — K219 Gastro-esophageal reflux disease without esophagitis: Secondary | ICD-10-CM | POA: Diagnosis not present

## 2017-07-22 DIAGNOSIS — Z89421 Acquired absence of other right toe(s): Secondary | ICD-10-CM | POA: Diagnosis not present

## 2017-07-22 DIAGNOSIS — N400 Enlarged prostate without lower urinary tract symptoms: Secondary | ICD-10-CM | POA: Diagnosis not present

## 2017-07-22 DIAGNOSIS — Z89411 Acquired absence of right great toe: Secondary | ICD-10-CM | POA: Diagnosis not present

## 2017-07-22 DIAGNOSIS — J449 Chronic obstructive pulmonary disease, unspecified: Secondary | ICD-10-CM | POA: Diagnosis not present

## 2017-07-22 DIAGNOSIS — D631 Anemia in chronic kidney disease: Secondary | ICD-10-CM | POA: Diagnosis not present

## 2017-07-23 ENCOUNTER — Other Ambulatory Visit: Payer: Self-pay | Admitting: Interventional Radiology

## 2017-07-23 ENCOUNTER — Encounter: Payer: Self-pay | Admitting: Radiology

## 2017-07-23 DIAGNOSIS — I739 Peripheral vascular disease, unspecified: Secondary | ICD-10-CM

## 2017-07-23 DIAGNOSIS — I96 Gangrene, not elsewhere classified: Secondary | ICD-10-CM

## 2017-07-26 ENCOUNTER — Ambulatory Visit (INDEPENDENT_AMBULATORY_CARE_PROVIDER_SITE_OTHER): Payer: Medicare Other | Admitting: Podiatry

## 2017-07-26 DIAGNOSIS — I2510 Atherosclerotic heart disease of native coronary artery without angina pectoris: Secondary | ICD-10-CM | POA: Diagnosis not present

## 2017-07-26 DIAGNOSIS — I129 Hypertensive chronic kidney disease with stage 1 through stage 4 chronic kidney disease, or unspecified chronic kidney disease: Secondary | ICD-10-CM | POA: Diagnosis not present

## 2017-07-26 DIAGNOSIS — Z89421 Acquired absence of other right toe(s): Secondary | ICD-10-CM | POA: Diagnosis not present

## 2017-07-26 DIAGNOSIS — N183 Chronic kidney disease, stage 3 (moderate): Secondary | ICD-10-CM | POA: Diagnosis not present

## 2017-07-26 DIAGNOSIS — N400 Enlarged prostate without lower urinary tract symptoms: Secondary | ICD-10-CM | POA: Diagnosis not present

## 2017-07-26 DIAGNOSIS — Z89411 Acquired absence of right great toe: Secondary | ICD-10-CM | POA: Diagnosis not present

## 2017-07-26 DIAGNOSIS — J449 Chronic obstructive pulmonary disease, unspecified: Secondary | ICD-10-CM | POA: Diagnosis not present

## 2017-07-26 DIAGNOSIS — D631 Anemia in chronic kidney disease: Secondary | ICD-10-CM | POA: Diagnosis not present

## 2017-07-26 DIAGNOSIS — Z9181 History of falling: Secondary | ICD-10-CM | POA: Diagnosis not present

## 2017-07-26 DIAGNOSIS — Z4781 Encounter for orthopedic aftercare following surgical amputation: Secondary | ICD-10-CM | POA: Diagnosis not present

## 2017-07-26 DIAGNOSIS — Z9889 Other specified postprocedural states: Secondary | ICD-10-CM

## 2017-07-26 DIAGNOSIS — K219 Gastro-esophageal reflux disease without esophagitis: Secondary | ICD-10-CM | POA: Diagnosis not present

## 2017-07-26 MED ORDER — HYDROCODONE-ACETAMINOPHEN 5-325 MG PO TABS: 1.0000 | ORAL_TABLET | Freq: Four times a day (QID) | ORAL | 0 refills | Status: DC | PRN

## 2017-07-28 NOTE — Progress Notes (Signed)
  Subjective:  Patient ID: Derrick [name], male    DOB: 1932/03/09,  MRN: [id number]  Chief Complaint  Patient presents with  . Toe Pain    Right second digit is discolored - patient concerned that it is getting infected as well.  . Wound Check    Right toe s/p amputation great toe - getting sore again, looks infected  . Difficulty Walking    Patient has been driving, which may be contributing to his right foot pain   83 y.o. male returns for the above complaint. Has not followed up with wound care specialist yet for HBO eval. Reports continued pain. States he received a tube of Santyl ointment.  Objective:  There were no vitals filed for this visit. General AA&O x3. Normal mood and affect.  Vascular Foot with excessive distal cooling. R 3rd toe pallor noted.  Neurologic Gross sensation intact.  Dermatologic  1st and 2nd amputation stumps with fibrotic wound beds. + probe to bone 1st metatarsal. Negative PTB 2nd metatarsal. 3rd toe with early gangrene.  Orthopedic: Tenderness to palpation noted about the surgical site.    Assessment & Plan:  Patient was evaluated and treated and all questions answered.  S/p R Hallux, 2nd Toe Amputation -Fibrotic wound base with probe to bone 1st metatarsal concerning for increased risk of bone infection. Discussed with patient. -Advised to apply Santyl ointment to the wound -Awaiting wound care/HBO eval -Norco refilled d/t pain.  R 3rd Toe Early Gangrene -Pallor to digit with signs of early gangrene. Digit does not appear viable long-term -Patient ultimately may need transmetatarsal amputation  Return in about 1 week (around 08/02/2017) for wound f/u.

## 2017-07-29 DIAGNOSIS — K219 Gastro-esophageal reflux disease without esophagitis: Secondary | ICD-10-CM | POA: Diagnosis not present

## 2017-07-29 DIAGNOSIS — Z89421 Acquired absence of other right toe(s): Secondary | ICD-10-CM | POA: Diagnosis not present

## 2017-07-29 DIAGNOSIS — D631 Anemia in chronic kidney disease: Secondary | ICD-10-CM | POA: Diagnosis not present

## 2017-07-29 DIAGNOSIS — I129 Hypertensive chronic kidney disease with stage 1 through stage 4 chronic kidney disease, or unspecified chronic kidney disease: Secondary | ICD-10-CM | POA: Diagnosis not present

## 2017-07-29 DIAGNOSIS — N183 Chronic kidney disease, stage 3 (moderate): Secondary | ICD-10-CM | POA: Diagnosis not present

## 2017-07-29 DIAGNOSIS — J449 Chronic obstructive pulmonary disease, unspecified: Secondary | ICD-10-CM | POA: Diagnosis not present

## 2017-07-29 DIAGNOSIS — Z9181 History of falling: Secondary | ICD-10-CM | POA: Diagnosis not present

## 2017-07-29 DIAGNOSIS — Z4781 Encounter for orthopedic aftercare following surgical amputation: Secondary | ICD-10-CM | POA: Diagnosis not present

## 2017-07-29 DIAGNOSIS — I2510 Atherosclerotic heart disease of native coronary artery without angina pectoris: Secondary | ICD-10-CM | POA: Diagnosis not present

## 2017-07-29 DIAGNOSIS — Z89411 Acquired absence of right great toe: Secondary | ICD-10-CM | POA: Diagnosis not present

## 2017-07-29 DIAGNOSIS — N400 Enlarged prostate without lower urinary tract symptoms: Secondary | ICD-10-CM | POA: Diagnosis not present

## 2017-07-29 NOTE — Telephone Encounter (Signed)
See clinicals. Please resume Santyl

## 2017-07-30 ENCOUNTER — Telehealth: Payer: Self-pay | Admitting: *Deleted

## 2017-07-30 ENCOUNTER — Encounter (HOSPITAL_BASED_OUTPATIENT_CLINIC_OR_DEPARTMENT_OTHER): Payer: Medicare Other | Attending: Surgery

## 2017-07-30 DIAGNOSIS — T8189XA Other complications of procedures, not elsewhere classified, initial encounter: Secondary | ICD-10-CM | POA: Diagnosis not present

## 2017-07-30 DIAGNOSIS — N189 Chronic kidney disease, unspecified: Secondary | ICD-10-CM | POA: Diagnosis not present

## 2017-07-30 DIAGNOSIS — L97519 Non-pressure chronic ulcer of other part of right foot with unspecified severity: Secondary | ICD-10-CM | POA: Diagnosis not present

## 2017-07-30 DIAGNOSIS — I129 Hypertensive chronic kidney disease with stage 1 through stage 4 chronic kidney disease, or unspecified chronic kidney disease: Secondary | ICD-10-CM | POA: Insufficient documentation

## 2017-07-30 DIAGNOSIS — Z85528 Personal history of other malignant neoplasm of kidney: Secondary | ICD-10-CM | POA: Diagnosis not present

## 2017-07-30 DIAGNOSIS — L97512 Non-pressure chronic ulcer of other part of right foot with fat layer exposed: Secondary | ICD-10-CM | POA: Insufficient documentation

## 2017-07-30 DIAGNOSIS — Z905 Acquired absence of kidney: Secondary | ICD-10-CM | POA: Diagnosis not present

## 2017-07-30 DIAGNOSIS — I70235 Atherosclerosis of native arteries of right leg with ulceration of other part of foot: Secondary | ICD-10-CM | POA: Diagnosis not present

## 2017-07-30 DIAGNOSIS — I2510 Atherosclerotic heart disease of native coronary artery without angina pectoris: Secondary | ICD-10-CM | POA: Insufficient documentation

## 2017-07-30 DIAGNOSIS — J449 Chronic obstructive pulmonary disease, unspecified: Secondary | ICD-10-CM | POA: Diagnosis not present

## 2017-07-30 DIAGNOSIS — I70261 Atherosclerosis of native arteries of extremities with gangrene, right leg: Secondary | ICD-10-CM | POA: Diagnosis not present

## 2017-07-30 DIAGNOSIS — Z8546 Personal history of malignant neoplasm of prostate: Secondary | ICD-10-CM | POA: Insufficient documentation

## 2017-07-30 DIAGNOSIS — Z89431 Acquired absence of right foot: Secondary | ICD-10-CM | POA: Insufficient documentation

## 2017-07-30 NOTE — Progress Notes (Signed)
   Subjective:    Patient ID: Derrick [name], male    DOB: 13-Jan-1932, 83 y.o.   MRN: [id number]   HPI 83 year old male presents for f/u of R great and 2nd toe amputations. Has been doing dressing changes with assistance of Home health care. Noticed the 3rd toe has been getting worse.   Review of Systems     Objective:   Physical Exam Vitals:   07/19/17 1429  BP: 121/70  Temp: 98.1 F (36.7 C)   General AA&O x3. Normal mood and affect.  Vascular R foot warm.  Neurologic Epicritic sensation grossly present.  Dermatologic Amputation site dehiscence with fibrogranular wound base. No probe to bone. No signs of infection. R 3rd toe with dry gangrene.  Orthopedic: MMT 5/5 in dorsiflexion, plantarflexion, inversion, and eversion. Normal joint ROM without pain or crepitus.      Assessment & Plan:  S/p R Hallux, R 2nd Toe Amputation; R 3rd toe Gangrene -Slightly more granular wound bed at this visit. -Pending eval for HBOT. Will hold off amputation until after eval. -Patient will ultimately need R 3rd toe amputation vs. TMA  F/u in 1 week.

## 2017-07-30 NOTE — Telephone Encounter (Signed)
Ria Comment - Dr. Ardeen Garland office states Dr. Con Memos would like to discuss pt with Dr. [name]. I gave Dr. [name], Dr. Ardeen Garland [phone].

## 2017-07-30 NOTE — Progress Notes (Signed)
   Subjective:    Patient ID: GUSTAV KNUEPPEL, male    DOB: 08-17-1932, 83 y.o.   MRN: [id number] Chief Complaint  Patient presents with  . Routine Post Op    06-12-2017 right foot and i am doing good but the nurse noticed monday the 3rd toe on right was not looking right     HPI 83 year old male presents for f/u of R great and 2nd toe amputations. States that his [place] noted that his 3rd toe was starting to change color. Denies N/V/F/Ch.  Review of Systems     Objective:   Physical Exam Vitals:   07/10/17 1438  BP: 90/61  Pulse: 98  Resp: 18  Temp: 99.6 F (37.6 C)   General AA&O x3. Normal mood and affect.  Vascular R foot warm. Pallor R 3rd digit and delayed capillary refill.  Neurologic Epicritic sensation grossly present.  Dermatologic Amputation site dehiscence with overlying areas of mixed fibrosis. R 3rd toe with pallor.  Orthopedic: MMT 5/5 in dorsiflexion, plantarflexion, inversion, and eversion. Normal joint ROM without pain or crepitus.     Assessment & Plan:  S/p R Hallux, R 2nd Toe Amputation -Wound with fibrotic wound base. No signs of acute infection. -Resume Santyl WTD dressings with [facility].  Gangrene R 3rd Toe -Early pallor noted. -Patient has received maximal attempt at revascularization. -Will facilitate referral to HBO therapy.  F/u in 1 week.

## 2017-08-01 DIAGNOSIS — Z4781 Encounter for orthopedic aftercare following surgical amputation: Secondary | ICD-10-CM | POA: Diagnosis not present

## 2017-08-02 ENCOUNTER — Ambulatory Visit (INDEPENDENT_AMBULATORY_CARE_PROVIDER_SITE_OTHER): Payer: Medicare Other | Admitting: Podiatry

## 2017-08-02 ENCOUNTER — Ambulatory Visit (INDEPENDENT_AMBULATORY_CARE_PROVIDER_SITE_OTHER): Payer: Medicare Other

## 2017-08-02 DIAGNOSIS — M86171 Other acute osteomyelitis, right ankle and foot: Secondary | ICD-10-CM

## 2017-08-02 DIAGNOSIS — I739 Peripheral vascular disease, unspecified: Secondary | ICD-10-CM

## 2017-08-02 DIAGNOSIS — I96 Gangrene, not elsewhere classified: Secondary | ICD-10-CM | POA: Diagnosis not present

## 2017-08-02 DIAGNOSIS — T8131XD Disruption of external operation (surgical) wound, not elsewhere classified, subsequent encounter: Secondary | ICD-10-CM

## 2017-08-02 MED ORDER — HYDROCODONE-ACETAMINOPHEN 5-325 MG PO TABS: 1.0000 | ORAL_TABLET | Freq: Four times a day (QID) | ORAL | 0 refills | Status: DC | PRN

## 2017-08-02 NOTE — Patient Instructions (Signed)
Pre-Operative Instructions  Congratulations, you have decided to take an important step towards improving your quality of life.  You can be assured that the doctors and staff at [facility] will be with you every step of the way.  Here are some important things you should know:  1. Plan to be at the surgery center/hospital at least 1 (one) hour prior to your scheduled time, unless otherwise directed by the surgical center/hospital staff.  You must have a responsible adult accompany you, remain during the surgery and drive you home.  Make sure you have directions to the surgical center/hospital to ensure you arrive on time. 2. If you are having surgery at [facility] or [facility] hospitals, you will need a copy of your medical history and physical form from your family physician within one month prior to the date of surgery. We will give you a form for your primary physician to complete.  3. We make every effort to accommodate the date you request for surgery.  However, there are times where surgery dates or times have to be moved.  We will contact you as soon as possible if a change in schedule is required.   4. No aspirin/ibuprofen for one week before surgery.  If you are on aspirin, any non-steroidal anti-inflammatory medications (Mobic, Aleve, Ibuprofen) should not be taken seven (7) days prior to your surgery.  You make take Tylenol for pain prior to surgery.  5. Medications - If you are taking daily heart and blood pressure medications, seizure, reflux, allergy, asthma, anxiety, pain or diabetes medications, make sure you notify the surgery center/hospital before the day of surgery so they can tell you which medications you should take or avoid the day of surgery. 6. No food or drink after midnight the night before surgery unless directed otherwise by surgical center/hospital staff. 7. No alcoholic beverages 24-hours prior to surgery.  No smoking 24-hours prior or 24-hours after  surgery. 8. Wear loose pants or shorts. They should be loose enough to fit over bandages, boots, and casts. 9. Don't wear slip-on shoes. Sneakers are preferred. 10. Bring your boot with you to the surgery center/hospital.  Also bring crutches or a walker if your physician has prescribed it for you.  If you do not have this equipment, it will be provided for you after surgery. 11. If you have not been contacted by the surgery center/hospital by the day before your surgery, call to confirm the date and time of your surgery. 12. Leave-time from work may vary depending on the type of surgery you have.  Appropriate arrangements should be made prior to surgery with your employer. 13. Prescriptions will be provided immediately following surgery by your doctor.  Fill these as soon as possible after surgery and take the medication as directed. Pain medications will not be refilled on weekends and must be approved by the doctor. 14. Remove nail polish on the operative foot and avoid getting pedicures prior to surgery. 15. Wash the night before surgery.  The night before surgery wash the foot and leg well with water and the antibacterial soap provided. Be sure to pay special attention to beneath the toenails and in between the toes.  Wash for at least three (3) minutes. Rinse thoroughly with water and dry well with a towel.  Perform this wash unless told not to do so by your physician.  Enclosed: 1 Ice pack (please put in freezer the night before surgery)   1 Hibiclens skin cleaner     Pre-op instructions  If you have any questions regarding the instructions, please do not hesitate to call our office.  [place]: [address], [place], [place] [address] -- [phone]  [place]: [address] [place], [place] [address] -- [phone]  [place]: [address]  [place], [place] [address] -- [phone]  [place]: [address], [address], [place], [place] [address] -- [phone]  Website:  [email or web address] 

## 2017-08-02 NOTE — H&P (View-Only) (Signed)
  Subjective:  Patient ID: Derrick [name], male    DOB: June 30, 1932,  MRN: [id number]  Chief Complaint  Patient presents with  . Foot Ulcer     wound check   83 y.o. male returns for wound care follow-up.  Was seen at the wound care center and told he would not qualify for hyperbaric oxygen therapy. Reports worsening gangrene of the R 3rd digit.  Objective:  There were no vitals filed for this visit. General AA&O x3. Normal mood and affect.  Vascular Excessive distal cooling. Non-palpable pulses.  Neurologic Epicritic sensation grossly present.  Dermatologic Wound dehiscence site of the R1st metatarsal with probe to bone. Fibrotic wound base noted. R 3rd toe dry gangrene noted. No erythema, warmth, or signs of acute infection. Deep tissue injury lateral foot.  Orthopedic: Amputation R 1st, 2nd toes.   Radiographs: Taken and reviewed. New erosion of the 1st metatarsal bone noted. Assessment & Plan:  Patient was evaluated and treated and all questions answered.  Gangrene right third toe; Osteomyelitis R 1st metatarsal, non-healing surgical wounds 2/2 PAD -XR taken and reviewed.  Erosion of the first metatarsal bone noted -Discussed with patient that due to the positive erosion of the bone with nonhealing wound and progression of the gangrene of the third digit ultimately patient would benefit from transmetatarsal amputation to remove the infected bone.  Discussed with patient that due to his poor vasculature there are no guarantees he would be able to heal an amputation at this level.  Per chart patient has a appointment with Dr. Corrie Mckusick for possible IR intervention.  Will reach out to Dr. [name] to see if intervention is to be performed on Wednesday and gain insight on whether or not he will be able to heal an amputation at this level. -Betadine wet-to-dry dressing placed today -Consent reviewed and signed by patient for metatarsal amputation right foot.  We will plan for the procedure to  be performed next week.  Exact date and time to be determined by surgical scheduler.  Follow up after surgery

## 2017-08-02 NOTE — Progress Notes (Signed)
  Subjective:  Patient ID: Derrick [name], male    DOB: [birth year],  MRN: [id number]  Chief Complaint  Patient presents with  . Foot Ulcer     wound check   83 y.o. male returns for wound care follow-up.  Was seen at the wound care center and told he would not qualify for hyperbaric oxygen therapy. Reports worsening gangrene of the R 3rd digit.  Objective:  There were no vitals filed for this visit. General AA&O x3. Normal mood and affect.  Vascular Excessive distal cooling. Non-palpable pulses.  Neurologic Epicritic sensation grossly present.  Dermatologic Wound dehiscence site of the R1st metatarsal with probe to bone. Fibrotic wound base noted. R 3rd toe dry gangrene noted. No erythema, warmth, or signs of acute infection. Deep tissue injury lateral foot.  Orthopedic: Amputation R 1st, 2nd toes.   Radiographs: Taken and reviewed. New erosion of the 1st metatarsal bone noted. Assessment & Plan:  Patient was evaluated and treated and all questions answered.  Gangrene right third toe; Osteomyelitis R 1st metatarsal, non-healing surgical wounds 2/2 PAD -XR taken and reviewed.  Erosion of the first metatarsal bone noted -Discussed with patient that due to the positive erosion of the bone with nonhealing wound and progression of the gangrene of the third digit ultimately patient would benefit from transmetatarsal amputation to remove the infected bone.  Discussed with patient that due to his poor vasculature there are no guarantees he would be able to heal an amputation at this level.  Per chart patient has a appointment with Dr. [name] for possible IR intervention.  Will reach out to Dr. [name] to see if intervention is to be performed on Wednesday and gain insight on whether or not he will be able to heal an amputation at this level. -Betadine wet-to-dry dressing placed today -Consent reviewed and signed by patient for metatarsal amputation right foot.  We will plan for the procedure to  be performed next week.  Exact date and time to be determined by surgical scheduler.  Follow up after surgery

## 2017-08-05 ENCOUNTER — Telehealth: Payer: Self-pay | Admitting: *Deleted

## 2017-08-05 DIAGNOSIS — Z89421 Acquired absence of other right toe(s): Secondary | ICD-10-CM | POA: Diagnosis not present

## 2017-08-05 DIAGNOSIS — Z89411 Acquired absence of right great toe: Secondary | ICD-10-CM | POA: Diagnosis not present

## 2017-08-05 DIAGNOSIS — D631 Anemia in chronic kidney disease: Secondary | ICD-10-CM | POA: Diagnosis not present

## 2017-08-05 DIAGNOSIS — K219 Gastro-esophageal reflux disease without esophagitis: Secondary | ICD-10-CM | POA: Diagnosis not present

## 2017-08-05 DIAGNOSIS — Z4781 Encounter for orthopedic aftercare following surgical amputation: Secondary | ICD-10-CM | POA: Diagnosis not present

## 2017-08-05 DIAGNOSIS — I2510 Atherosclerotic heart disease of native coronary artery without angina pectoris: Secondary | ICD-10-CM | POA: Diagnosis not present

## 2017-08-05 DIAGNOSIS — I129 Hypertensive chronic kidney disease with stage 1 through stage 4 chronic kidney disease, or unspecified chronic kidney disease: Secondary | ICD-10-CM | POA: Diagnosis not present

## 2017-08-05 DIAGNOSIS — N183 Chronic kidney disease, stage 3 (moderate): Secondary | ICD-10-CM | POA: Diagnosis not present

## 2017-08-05 DIAGNOSIS — N400 Enlarged prostate without lower urinary tract symptoms: Secondary | ICD-10-CM | POA: Diagnosis not present

## 2017-08-05 DIAGNOSIS — Z9181 History of falling: Secondary | ICD-10-CM | POA: Diagnosis not present

## 2017-08-05 DIAGNOSIS — J449 Chronic obstructive pulmonary disease, unspecified: Secondary | ICD-10-CM | POA: Diagnosis not present

## 2017-08-05 NOTE — Telephone Encounter (Signed)
PATIENT OF [facility] Pre-operative Risk Assessment    Request for surgical clearance:  1. What type of surgery is being performed? FOOT SURGERY NOT SPECIFIC  2. When is this surgery scheduled? 11-01 -18 3. Are there any medications that need to be held prior to surgery and how long?ASPIRIN  4. Practice name and name of physician performing surgery? [facility]  ---DR Legrand Com[facility] PRICE  5. What is your office phone and fax number?  PHONE N/A     FAX TO: MAIN OR  [phone]  6. Anesthesia type (None, local, MAC, general) UNKNOWN   Derrick [name] 08/05/2017, 6:15 PM  _________________________________________________________________   (provider comments below)

## 2017-08-06 ENCOUNTER — Other Ambulatory Visit (HOSPITAL_COMMUNITY): Payer: Self-pay | Admitting: Interventional Radiology

## 2017-08-06 ENCOUNTER — Telehealth: Payer: Self-pay | Admitting: *Deleted

## 2017-08-06 DIAGNOSIS — I96 Gangrene, not elsewhere classified: Secondary | ICD-10-CM

## 2017-08-06 DIAGNOSIS — I2510 Atherosclerotic heart disease of native coronary artery without angina pectoris: Secondary | ICD-10-CM | POA: Diagnosis not present

## 2017-08-06 DIAGNOSIS — Z85528 Personal history of other malignant neoplasm of kidney: Secondary | ICD-10-CM | POA: Diagnosis not present

## 2017-08-06 DIAGNOSIS — I129 Hypertensive chronic kidney disease with stage 1 through stage 4 chronic kidney disease, or unspecified chronic kidney disease: Secondary | ICD-10-CM | POA: Diagnosis not present

## 2017-08-06 DIAGNOSIS — Z89431 Acquired absence of right foot: Secondary | ICD-10-CM | POA: Diagnosis not present

## 2017-08-06 DIAGNOSIS — Z905 Acquired absence of kidney: Secondary | ICD-10-CM | POA: Diagnosis not present

## 2017-08-06 DIAGNOSIS — N189 Chronic kidney disease, unspecified: Secondary | ICD-10-CM | POA: Diagnosis not present

## 2017-08-06 DIAGNOSIS — J449 Chronic obstructive pulmonary disease, unspecified: Secondary | ICD-10-CM | POA: Diagnosis not present

## 2017-08-06 DIAGNOSIS — L97512 Non-pressure chronic ulcer of other part of right foot with fat layer exposed: Secondary | ICD-10-CM | POA: Diagnosis not present

## 2017-08-06 DIAGNOSIS — I70261 Atherosclerosis of native arteries of extremities with gangrene, right leg: Secondary | ICD-10-CM | POA: Diagnosis not present

## 2017-08-06 DIAGNOSIS — I70235 Atherosclerosis of native arteries of right leg with ulceration of other part of foot: Secondary | ICD-10-CM | POA: Diagnosis not present

## 2017-08-06 NOTE — Telephone Encounter (Signed)
SPOKE WITH PT AND FAMILY ON LINE TO GO AHEAD AND HOLD  ASA AND CONTACT OFFICE IN AM FOR FUTHER STEPS FOR PROCEDURE ON 11-1

## 2017-08-06 NOTE — Telephone Encounter (Signed)
    Chart reviewed as part of pre-operative protocol coverage. Patient was contacted 08/06/2017 in reference to pre-operative risk assessment for pending surgery as outlined below.  Derrick [name] was last seen on 06/12/17 by Dr. Marlou Porch for surgical clearance Amputation of 1st and 2nd toe. Since that day, CAMRIN GEARHEART has done well. No new symptoms.   Therefore, based on ACC/AHA guidelines, the patient would be at acceptable risk for the planned procedure without further cardiovascular testing.   [place] we hold ASA for 5-7 days. However patient has surgery day after tomorrow. Its up to surgeon to reschedule or not. Reviewed with DOD Dr. Johnsie Cancel.    Davis, PA 08/06/2017, 4:16 PM

## 2017-08-07 ENCOUNTER — Encounter (HOSPITAL_COMMUNITY): Payer: Self-pay | Admitting: *Deleted

## 2017-08-07 ENCOUNTER — Other Ambulatory Visit: Payer: Medicare Other

## 2017-08-07 ENCOUNTER — Other Ambulatory Visit: Payer: Self-pay | Admitting: Physician Assistant

## 2017-08-07 ENCOUNTER — Telehealth: Payer: Self-pay | Admitting: *Deleted

## 2017-08-07 ENCOUNTER — Other Ambulatory Visit: Payer: Self-pay | Admitting: Radiology

## 2017-08-07 NOTE — Telephone Encounter (Signed)
The patient came in as a walk in inquiring about a history and physical form that needs to be sent to [facility] for surgery scheduled tomorrow. According to Epic, he has been cleared by cardiology for surgery.   A call was placed to [facility] to verify what they need for surgery. It was explained that they still need a history and physical from the patient's PCP office. The patient and his wife were informed that they would need to call his PCP and ask them to fax over the latest H&P. They verbalized their understanding.

## 2017-08-07 NOTE — Telephone Encounter (Signed)
"  Mr. Mesa is scheduled for surgery on tomorrow.  We have not received his history and physical form yet.  We got the medical clearance letter from his Cardiologist but not the history and physical form."  I called and informed Mrs. Senger that we nor  [facility] have not received the history and physical form from his primary care doctor.   "I am out right now.  I will drive by there.  We gave the paperwork to them last week.  What's your phone number?"  My phone number is [phone] and the fax number is [phone].  "I will see what I can find out."

## 2017-08-07 NOTE — Progress Notes (Signed)
Spoke with Delydia, Surgical Coordinator regarding pt H&P; awaiting a return call.

## 2017-08-07 NOTE — Progress Notes (Signed)
Pt denies any acute cardiopulmonary issues. Cardiac clearance noted 08/06/17. Pt made aware to stop taking taking vitamins, fish oil and herbal medications. Do not take any NSAIDs ie: Ibuprofen, Advil, Naproxen (Aleve), Motrin, BC and Goody Powder. Pt verbalized understanding of all pre-op instructions. Measure and add neck size and BMI to complete Apnea screening.

## 2017-08-07 NOTE — Telephone Encounter (Signed)
"  Our patient, Derrick [name], is scheduled for surgery with your facility tomorrow.  You're needing more information from our office .  We're unsure of what that information is.  If you could, give me a call back."  We can get the information we need and get it sent to you so he can have his surgery tomorrow."  I am returning your call Abigail Butts.  Mr.

## 2017-08-07 NOTE — Telephone Encounter (Addendum)
I called to inform the patient's wife about the visit with Vassie Moment.  She stated they were supposed to be at [facility] at 7 am for the surgery.  It appears he is scheduled for Interv. Radiology at 8:15 am at [facility].  I advised her to go ahead and go for that appointment and I will see if we can get his foot surgery rescheduled to Friday.  I will call Dr. Pennie Banter office and see if we can get a later time for the physical.    I tried to call Threasa Beards or Stanton Kidney at Dr. Pennie Banter office to reschedule the physical appointment.  They had left for the day.  I will call first thing in the morning and see if they can get him in for a later appointment time for the physical.

## 2017-08-07 NOTE — Telephone Encounter (Signed)
I received the history and physical form from Dr. Pennie Banter office. The physical exam was performed on 06/11/17.  The health and physical form was completed today.  Patient was cleared to have surgery from Cardiologist standpoint as well as Primary Care doctor.    I called and faxed the information to [facility].  She's going to check with the anesthesiologist to see if patient will be able to have surgery based on this information.  She said she would call me back and let me know.  I will then call Melanie at Dr. Pennie Banter office and let her know.

## 2017-08-07 NOTE — Telephone Encounter (Signed)
"  I spoke to the anesthesiologist and he spoke to the director of operations.  She said no, we cannot use this history and physical form because patient was seen last over 30 days ago.  He's going to need to have a physical done."  Okay, I will call Melanie at Dr. Pennie Banter office and let her know.  "You will need to call and cancel the surgery and call and let the patient know as well."  I will give him a call.    I called Threasa Beards and told her that the completed history and physical form cannot be used.  I informed her that if Dr. Shelia Media could see him tomorrow before 10 am.  He could still have the surgery.  She said she would check with Gay Filler Judson Roch) Dr. Pennie Banter scheduler or nurse and see if they can get him in for an appointment.  Threasa Beards called me back and said they can have Mr. Bair scheduled to see Dr. Clarene Duke tomorrow morning at 8:15 am.  She stated she tried calling Mr. Seipp and left him a detailed message about the appointment.  I told her I would try to reach the patient.  I called and asked Sherlynn Stalls to leave patient's surgery scheduled for tomorrow because he's going to see Dr. Vassie Moment tomorrow at 8:15 am.

## 2017-08-07 NOTE — H&P (Signed)
Chief Complaint: Non-healing foot ulcerations/gangrene Vascular disease  Referring Physician(s): [name]  Supervising Physician: Corrie Mckusick  Patient Status: [facility] - Out-pt  History of Present Illness: Derrick [name] is a 83 y.o. male past medical history significant for CAD, COPD, hypertension, renal cell carcinoma (post nephrectomy in 2000), chronic kidney disease and rheumatoid arthritis.  He underwent incision and drainage of the paronychia of the right great toe on 05/09/2017.  His post operative course was complicated by development of marked surrounding erythema as well as a blackened area on the tip of the right great toe.   He sees Dr. [name] for care of his toe.   He is known to our service. He underwent arteriogram by Dr. [name] on 05/30/2017. Dr. [name] was unable to revascularize him at that time due to densely calcified vessels.  He was most recently seen on 08/02/2017.  He has gangrene of the right 3rd toe, osteomyelitis of the right 1st metatarsal which are non-healing secondary to PAD.  He is going to require a trans-metatarsal amputation in the near future, but this may not heal due to his  Vascular disease.  Dr. [name] has reached out to Dr. [name] for arteriogram and possible angioplasty/stent in hopes of improving his blood flow to the foot prior to the trans-met amp.  Patient has a remote history of smoking (grade than 40 years ago).   Patient denies claudication symptoms though states that his sensation within his bilateral feet is reduced. Patient denies history of diabetes.  He is NPO.  Past Medical History:  Diagnosis Date  . Anemia   . Anxiety   . Arthritis, rheumatoid ([place])   . BPH (benign prostatic hyperplasia)   . CAD (coronary artery disease)   . CKD (chronic kidney disease)   . COPD (chronic obstructive pulmonary disease) ([place])   . Depression   . Diverticulosis   . Elevated PSA   . Gangrene ([place])    of toe right foot  . GERD  (gastroesophageal reflux disease)   . Herpes simplex labialis   . Hiatal hernia   . High cholesterol   . History of colonic polyps   . Hypertension   . Insomnia   . Lacunar infarction   . Obesity   . Panic attacks   . Prostate cancer ([place])   . Renal cell cancer ([place])    s/p nephrectomy in 2000  . Seropositive rheumatoid arthritis ([place])   . Spermatocele   . Tinnitus   . Tremor of both hands     Past Surgical History:  Procedure Laterality Date  . AMPUTATION TOE Right 06/13/2017   Procedure: AMPUTATION TOE 1st and 2nd;  Surgeon: [name], DPM;  Location: [place];  Service: Podiatry;  Laterality: Right;  . BACK SURGERY    . COLONOSCOPY    . IR ANGIOGRAM EXTREMITY RIGHT  05/30/2017  . IR RADIOLOGIST EVAL & MGMT  05/16/2017  . IR TIB-PERO ART PTA MOD SED  05/30/2017  . IR TIB-PERO ART UNI PTA EA ADD VESSEL MOD SED  05/30/2017  . IR US GUIDE VASC ACCESS RIGHT  05/30/2017  . LEFT HEART CATHETERIZATION WITH CORONARY ANGIOGRAM N/A 11/19/2011   Procedure: LEFT HEART CATHETERIZATION WITH CORONARY ANGIOGRAM;  Surgeon: [name], MD;  Location: [facility] CATH LAB;  Service: Cardiovascular;  Laterality: N/A;  . NEPHRECTOMY  10/1998    Allergies: Ace inhibitors  Medications: Prior to Admission medications   Medication Sig Start Date End Date Taking? Authorizing Provider  aspirin EC 81 MG tablet Take 81 mg by mouth at bedtime.     [provider]  doxycycline (VIBRA-TABS) 100 MG tablet Take 1 tablet (100 mg total) by mouth 2 (two) times daily. Patient not taking: Reported on 08/05/2017 06/24/17   Trula Slade, DPM  finasteride (PROSCAR) 5 MG tablet Take 5 mg by mouth daily.    [provider]  HYDROcodone-acetaminophen (NORCO) 5-325 MG tablet Take 1 tablet by mouth every 6 (six) hours as needed for moderate pain. 08/02/17   [name], DPM  Omega-3 Fatty Acids (FISH OIL) 1200 MG CAPS Take 1,200 mg by mouth 2 (two) times daily.    [provider]    pantoprazole (PROTONIX) 40 MG tablet Take 40 mg by mouth 2 (two) times daily.    [provider]  predniSONE (DELTASONE) 5 MG tablet Take 5 mg by mouth daily with breakfast.     [provider]  vitamin B-12 (CYANOCOBALAMIN) 1000 MCG tablet Take 1,000 mcg by mouth daily.    [provider]     Family History  Problem Relation Age of Onset  . Heart attack Father 52  . Stroke Father   . Kidney failure Sister        Bright's disease   . Cancer Paternal Aunt        d. 3s; NOS cancer of leg  . Kidney disease Sister        d. 72; "Bright's disease" - possible kidney cancer?  . Cancer Sister        NOS cancer; d. 48s; "sick in the hospital"  . Heart attack Son 62  . Leukemia Son 63  . COPD Son   . Breast cancer Daughter 33    Social History   Social History  . Marital status: Married    Spouse name: N/A  . Number of children: N/A  . Years of education: N/A   Social History Main Topics  . Smoking status: Former Smoker    Packs/day: 1.00    Years: 29.00    Types: Cigarettes    Quit date: 10/08/1978  . Smokeless tobacco: Never Used     Comment: tried smokeless tobacco once but made him sick  . Alcohol use No     Comment: drank very little in his life - only socially   . Drug use: No  . Sexual activity: Not Asked   Other Topics Concern  . None   Social History Narrative   Retired   Married   4 children          Review of Systems: A 12 point ROS discussed  Review of Systems  Constitutional: Positive for activity change.  HENT: Negative.   Respiratory: Negative.   Cardiovascular: Negative.   Gastrointestinal: Negative.   Genitourinary: Negative.   Musculoskeletal: Negative.   Skin:       Non-healing toe ulcers  Neurological: Negative.   Hematological: Negative.   Psychiatric/Behavioral: Negative.     Vital Signs: There were no vitals taken for this visit.  Physical Exam  Constitutional: He is oriented to person, place, and  time. He appears well-developed.  HENT:  Head: Normocephalic and atraumatic.  Eyes: EOM are normal.  Neck: Normal range of motion.  Cardiovascular: Normal rate, regular rhythm and normal heart sounds.   Pulmonary/Chest: Effort normal and breath sounds normal. No respiratory distress. He has no wheezes.  Abdominal: Soft. He exhibits no distension. There is no tenderness.  Musculoskeletal:  Right  foot with dressing in place  Neurological: He is alert and oriented to person, place, and time.  Psychiatric: He has a normal mood and affect. His behavior is normal. Judgment and thought content normal.    Imaging: Dg Foot Complete Right  Result Date: 08/02/2017 Please see detailed radiograph report in office note.  Dg Foot Complete Right  Result Date: 07/11/2017 Please see detailed radiograph report in office note.   Labs:  CBC:  Recent Labs  12/05/16 1258 05/30/17 0721 06/12/17 1026 06/13/17 0457  WBC 7.2 6.5 10.2 8.3  HGB 13.6 11.8* 12.4* 12.4*  HCT 39.4 35.0* 36.8* 36.7*  PLT 169 183 212 206    COAGS:  Recent Labs  05/30/17 0721 06/12/17 1026  INR 1.10 1.06  APTT 31  --     BMP:  Recent Labs  12/05/16 1258 05/22/17 1420 05/30/17 0721 06/12/17 1026 06/13/17 0457  NA 129*  --  131* 130* 133*  K 3.9  --  3.8 4.2 4.6  CL 98*  --  99* 99* 101  CO2 22  --  23 20* 24  GLUCOSE 130*  --  97 107* 104*  BUN 21*  --  '13 12 16  ' CALCIUM 8.8*  --  8.9 9.6 9.2  CREATININE 1.54* 1.50* 1.36* 1.22 1.38*  GFRNONAA 40*  --  47* 53* 46*  GFRAA 47*  --  54* >60 53*    LIVER FUNCTION TESTS:  Recent Labs  06/12/17 1026  BILITOT 1.0  AST 19  ALT 17  ALKPHOS 50  PROT 6.8  ALBUMIN 3.5    TUMOR MARKERS: No results for input(s): AFPTM, CEA, CA199, CHROMGRNA in the last 8760 hours.  Assessment and Plan:  Gangrene of the right 3rd toe, osteomyelitis of the right 1st metatarsal which are non-healing secondary to PAD.  Needs trans-metatarsal amputation  Will  proceed with LE arteriogram and possible angioplasty today by Dr. [name].  Risks and benefits of arteriogram were discussed with the patient including, but not limited to bleeding, infection, vascular injury or contrast induced renal failure.  This interventional procedure involves the use of X-rays and because of the nature of the planned procedure, it is possible that we will have prolonged use of X-ray fluoroscopy.  Potential radiation risks to you include (but are not limited to) the following: - A slightly elevated risk for cancer  several years later in life. This risk is typically less than 0.5% percent. This risk is low in comparison to the normal incidence of human cancer, which is 33% for women and 50% for men according to the [employer]. - Radiation induced injury can include skin redness, resembling a rash, tissue breakdown / ulcers and hair loss (which can be temporary or permanent).   The likelihood of either of these occurring depends on the difficulty of the procedure and whether you are sensitive to radiation due to previous procedures, disease, or genetic conditions.   IF your procedure requires a prolonged use of radiation, you will be notified and given written instructions for further action.  It is your responsibility to monitor the irradiated area for the 2 weeks following the procedure and to notify your physician if you are concerned that you have suffered a radiation induced injury.    All of the patient's questions were answered, patient is agreeable to proceed.  Consent signed and in chart.  Thank you for this interesting consult.  I greatly enjoyed meeting Derrick [name] and look forward to participating  in their care.  A copy of this report was sent to the requesting provider on this date.  Electronically Signed: Murrell Redden, PA-C 08/08/2017, 8:06 AM   I spent a total of  25 Minutes in face to face in clinical consultation, greater than 50% of  which was counseling/coordinating care for lower extremity angiography.

## 2017-08-08 ENCOUNTER — Ambulatory Visit (HOSPITAL_COMMUNITY)
Admission: RE | Admit: 2017-08-08 | Discharge: 2017-08-08 | Disposition: A | Discharge status: Home / Self Care | Payer: Medicare Other | Source: Ambulatory Visit | Attending: Interventional Radiology | Admitting: Interventional Radiology

## 2017-08-08 ENCOUNTER — Ambulatory Visit (HOSPITAL_COMMUNITY)
Admission: RE | Admit: 2017-08-08 | Discharge: 2017-08-08 | Disposition: A | Discharge status: Home / Self Care | Payer: Medicare Other | Source: Ambulatory Visit | Attending: Podiatry | Admitting: Podiatry

## 2017-08-08 ENCOUNTER — Encounter (HOSPITAL_COMMUNITY): Payer: Self-pay

## 2017-08-08 DIAGNOSIS — L97519 Non-pressure chronic ulcer of other part of right foot with unspecified severity: Secondary | ICD-10-CM | POA: Insufficient documentation

## 2017-08-08 DIAGNOSIS — M869 Osteomyelitis, unspecified: Secondary | ICD-10-CM | POA: Diagnosis not present

## 2017-08-08 DIAGNOSIS — Z8249 Family history of ischemic heart disease and other diseases of the circulatory system: Secondary | ICD-10-CM | POA: Diagnosis not present

## 2017-08-08 DIAGNOSIS — I739 Peripheral vascular disease, unspecified: Secondary | ICD-10-CM | POA: Diagnosis present

## 2017-08-08 DIAGNOSIS — Z7952 Long term (current) use of systemic steroids: Secondary | ICD-10-CM | POA: Insufficient documentation

## 2017-08-08 DIAGNOSIS — J449 Chronic obstructive pulmonary disease, unspecified: Secondary | ICD-10-CM | POA: Diagnosis not present

## 2017-08-08 DIAGNOSIS — I129 Hypertensive chronic kidney disease with stage 1 through stage 4 chronic kidney disease, or unspecified chronic kidney disease: Secondary | ICD-10-CM | POA: Insufficient documentation

## 2017-08-08 DIAGNOSIS — I2510 Atherosclerotic heart disease of native coronary artery without angina pectoris: Secondary | ICD-10-CM | POA: Diagnosis not present

## 2017-08-08 DIAGNOSIS — E7800 Pure hypercholesterolemia, unspecified: Secondary | ICD-10-CM | POA: Insufficient documentation

## 2017-08-08 DIAGNOSIS — K219 Gastro-esophageal reflux disease without esophagitis: Secondary | ICD-10-CM | POA: Insufficient documentation

## 2017-08-08 DIAGNOSIS — I96 Gangrene, not elsewhere classified: Secondary | ICD-10-CM

## 2017-08-08 DIAGNOSIS — Z89411 Acquired absence of right great toe: Secondary | ICD-10-CM | POA: Diagnosis not present

## 2017-08-08 DIAGNOSIS — M069 Rheumatoid arthritis, unspecified: Secondary | ICD-10-CM | POA: Insufficient documentation

## 2017-08-08 DIAGNOSIS — D631 Anemia in chronic kidney disease: Secondary | ICD-10-CM | POA: Diagnosis not present

## 2017-08-08 DIAGNOSIS — Z8673 Personal history of transient ischemic attack (TIA), and cerebral infarction without residual deficits: Secondary | ICD-10-CM | POA: Insufficient documentation

## 2017-08-08 DIAGNOSIS — Z7982 Long term (current) use of aspirin: Secondary | ICD-10-CM | POA: Insufficient documentation

## 2017-08-08 DIAGNOSIS — Z9181 History of falling: Secondary | ICD-10-CM | POA: Diagnosis not present

## 2017-08-08 DIAGNOSIS — G4700 Insomnia, unspecified: Secondary | ICD-10-CM | POA: Diagnosis not present

## 2017-08-08 DIAGNOSIS — F419 Anxiety disorder, unspecified: Secondary | ICD-10-CM | POA: Diagnosis not present

## 2017-08-08 DIAGNOSIS — Z85528 Personal history of other malignant neoplasm of kidney: Secondary | ICD-10-CM | POA: Insufficient documentation

## 2017-08-08 DIAGNOSIS — N189 Chronic kidney disease, unspecified: Secondary | ICD-10-CM | POA: Diagnosis not present

## 2017-08-08 DIAGNOSIS — F329 Major depressive disorder, single episode, unspecified: Secondary | ICD-10-CM | POA: Insufficient documentation

## 2017-08-08 DIAGNOSIS — I771 Stricture of artery: Secondary | ICD-10-CM | POA: Diagnosis not present

## 2017-08-08 DIAGNOSIS — N400 Enlarged prostate without lower urinary tract symptoms: Secondary | ICD-10-CM | POA: Insufficient documentation

## 2017-08-08 DIAGNOSIS — E669 Obesity, unspecified: Secondary | ICD-10-CM | POA: Diagnosis not present

## 2017-08-08 DIAGNOSIS — I70261 Atherosclerosis of native arteries of extremities with gangrene, right leg: Secondary | ICD-10-CM | POA: Diagnosis not present

## 2017-08-08 DIAGNOSIS — Z87891 Personal history of nicotine dependence: Secondary | ICD-10-CM | POA: Diagnosis not present

## 2017-08-08 DIAGNOSIS — Z905 Acquired absence of kidney: Secondary | ICD-10-CM | POA: Insufficient documentation

## 2017-08-08 DIAGNOSIS — Z89421 Acquired absence of other right toe(s): Secondary | ICD-10-CM | POA: Insufficient documentation

## 2017-08-08 DIAGNOSIS — Z4781 Encounter for orthopedic aftercare following surgical amputation: Secondary | ICD-10-CM | POA: Diagnosis not present

## 2017-08-08 DIAGNOSIS — M86171 Other acute osteomyelitis, right ankle and foot: Secondary | ICD-10-CM | POA: Diagnosis not present

## 2017-08-08 DIAGNOSIS — N183 Chronic kidney disease, stage 3 (moderate): Secondary | ICD-10-CM | POA: Diagnosis not present

## 2017-08-08 HISTORY — DX: Gangrene, not elsewhere classified: I96

## 2017-08-08 HISTORY — PX: IR US GUIDE VASC ACCESS RIGHT: IMG2390

## 2017-08-08 HISTORY — PX: IR TIB-PERO ART PTA MOD SED: IMG2313

## 2017-08-08 HISTORY — PX: IR TIB-PERO ART UNI PTA EA ADD VESSEL MOD SED: IMG2317

## 2017-08-08 HISTORY — PX: IR ANGIOGRAM SELECTIVE EACH ADDITIONAL VESSEL: IMG667

## 2017-08-08 HISTORY — PX: IR ANGIOGRAM EXTREMITY RIGHT: IMG652

## 2017-08-08 LAB — BASIC METABOLIC PANEL
Anion gap: 10 (ref 5–15)
BUN: 12 mg/dL (ref 6–20)
CALCIUM: 9.5 mg/dL (ref 8.9–10.3)
CO2: 23 mmol/L (ref 22–32)
CREATININE: 1.32 mg/dL — AB (ref 0.61–1.24)
Chloride: 100 mmol/L — ABNORMAL LOW (ref 101–111)
GFR calc Af Amer: 56 mL/min — ABNORMAL LOW (ref >60)
GFR, EST NON AFRICAN AMERICAN: 49 mL/min — AB (ref >60)
GLUCOSE: 111 mg/dL — AB (ref 65–99)
Potassium: 3.6 mmol/L (ref 3.5–5.1)
SODIUM: 133 mmol/L — AB (ref 135–145)

## 2017-08-08 LAB — CBC
HCT: 34.8 % — ABNORMAL LOW (ref 39.0–52.0)
Hemoglobin: 11.7 g/dL — ABNORMAL LOW (ref 13.0–17.0)
MCH: 29 pg (ref 26.0–34.0)
MCHC: 33.6 g/dL (ref 30.0–36.0)
MCV: 86.1 fL (ref 78.0–100.0)
PLATELETS: 252 10*3/uL (ref 150–400)
RBC: 4.04 MIL/uL — ABNORMAL LOW (ref 4.22–5.81)
RDW: 14.6 % (ref 11.5–15.5)
WBC: 9.5 10*3/uL (ref 4.0–10.5)

## 2017-08-08 LAB — POCT ACTIVATED CLOTTING TIME: ACTIVATED CLOTTING TIME: 175 s

## 2017-08-08 LAB — PROTIME-INR
INR: 1.04
Prothrombin Time: 13.5 seconds (ref 11.4–15.2)

## 2017-08-08 MED ORDER — FENTANYL CITRATE (PF) 100 MCG/2ML IJ SOLN
INTRAMUSCULAR | Status: AC
  Filled 2017-08-08: qty 4

## 2017-08-08 MED ORDER — LIDOCAINE HCL 1 % IJ SOLN
INTRAMUSCULAR | Status: AC | PRN
  Administered 2017-08-08: 6 mL

## 2017-08-08 MED ORDER — FENTANYL CITRATE (PF) 100 MCG/2ML IJ SOLN
INTRAMUSCULAR | Status: AC | PRN
  Administered 2017-08-08 (×4): 25 ug via INTRAVENOUS

## 2017-08-08 MED ORDER — NITROGLYCERIN 1 MG/10 ML FOR IR/CATH LAB
INTRA_ARTERIAL | Status: AC | PRN
  Administered 2017-08-08 (×2): 300 ug via INTRA_ARTERIAL

## 2017-08-08 MED ORDER — SODIUM CHLORIDE 0.9 % IV SOLN
INTRAVENOUS | Status: AC | PRN
  Administered 2017-08-08: 10 mL/h via INTRAVENOUS

## 2017-08-08 MED ORDER — MIDAZOLAM HCL 2 MG/2ML IJ SOLN
INTRAMUSCULAR | Status: AC | PRN
  Administered 2017-08-08 (×3): 0.5 mg via INTRAVENOUS

## 2017-08-08 MED ORDER — HYDROMORPHONE HCL 1 MG/ML IJ SOLN
INTRAMUSCULAR | Status: AC
  Filled 2017-08-08: qty 1

## 2017-08-08 MED ORDER — IODIXANOL 320 MG/ML IV SOLN: 100.0000 mL | Freq: Once | INTRAVENOUS | Status: DC | PRN

## 2017-08-08 MED ORDER — HEPARIN SODIUM (PORCINE) 1000 UNIT/ML IJ SOLN
INTRAMUSCULAR | Status: AC
  Filled 2017-08-08: qty 1

## 2017-08-08 MED ORDER — HYDROMORPHONE HCL 1 MG/ML IJ SOLN
INTRAMUSCULAR | Status: AC | PRN
  Administered 2017-08-08 (×2): 0.5 mg via INTRAVENOUS

## 2017-08-08 MED ORDER — SODIUM CHLORIDE 0.9 % IV SOLN: INTRAVENOUS | Status: DC

## 2017-08-08 MED ORDER — LIDOCAINE HCL 1 % IJ SOLN
INTRAMUSCULAR | Status: AC
  Filled 2017-08-08: qty 20

## 2017-08-08 MED ORDER — CLOPIDOGREL BISULFATE 75 MG PO TABS
300.0000 mg | ORAL_TABLET | Freq: Once | ORAL | Status: AC
  Administered 2017-08-08: 300 mg via ORAL

## 2017-08-08 MED ORDER — SODIUM CHLORIDE 0.9 % IV SOLN: INTRAVENOUS | Status: AC

## 2017-08-08 MED ORDER — ALTEPLASE 2 MG IJ SOLR
INTRAMUSCULAR | Status: AC
  Filled 2017-08-08: qty 4

## 2017-08-08 MED ORDER — NITROGLYCERIN 1 MG/10 ML FOR IR/CATH LAB
INTRA_ARTERIAL | Status: AC
  Filled 2017-08-08: qty 10

## 2017-08-08 MED ORDER — CLOPIDOGREL BISULFATE 300 MG PO TABS
ORAL_TABLET | ORAL | Status: AC
  Filled 2017-08-08: qty 1

## 2017-08-08 MED ORDER — HEPARIN SODIUM (PORCINE) 1000 UNIT/ML IJ SOLN
INTRAMUSCULAR | Status: AC | PRN
  Administered 2017-08-08: 8000 [IU] via INTRAVENOUS

## 2017-08-08 MED ORDER — ALTEPLASE 100 MG IV SOLR
INTRAVENOUS | Status: AC | PRN
  Administered 2017-08-08 (×2): 2 mg

## 2017-08-08 MED ORDER — MIDAZOLAM HCL 2 MG/2ML IJ SOLN
INTRAMUSCULAR | Status: AC
  Filled 2017-08-08: qty 4

## 2017-08-08 NOTE — Telephone Encounter (Signed)
"  I'm calling from [facility] concerning Derrick [name].  If he can come in at 11:30 today for that surgical clearance appointment.  Again his appointment today is at 11:30 am.  I tried reach him on the phone and couldn't get him."  I called Mrs. Doerr to find out what time [name] procedure would be over.  She stated she didn't know the time.  She stated they were running behind.  I told her I would call her back with a surgery date and time.   I called to check on the status of [name].  Mrs. Burditt said they were still doing the procedure.  She asked the receptionist how much longer the procedure would take and the receptionist said she could not find anyone to ask.  I asked Mrs. Stemen to call me back at 10:50 am if he was still not out so I can call [facility] to reschedule the physical appointment.

## 2017-08-08 NOTE — Sedation Documentation (Signed)
Right groin dressing dry and intact. Pulses present

## 2017-08-08 NOTE — Telephone Encounter (Signed)
I called Abigail Butts at Dr. Pennie Banter office to see if we could schedule the physical to this afternoon.  I explained to her that the patient's Interv. Radiation treatment for vascular issues started later than expected for the patient this morning.  She stated she would have to call me back.  I explained to her that the patient's surgery was rescheduled for Monday, November 5.  "I'm returning your call.  We are not going to be able to see this patient.  We have offered you several times to try and accommodate you and the patient is not able to do it.  We don't have anything available this afternoon nor do we have anything available tomorrow.  Our doctors have suggested he go to an Urgent Care to get a physical."  [facility] will not accept physicals from an Urgent Care Physician.  They said it has to be the primary care physician.  He needs to have this surgery right away.  "I don't know what else to tell you."

## 2017-08-08 NOTE — Sedation Documentation (Signed)
Called to give report. Nurse not available. Will call back

## 2017-08-08 NOTE — Sedation Documentation (Signed)
Pt in no distress. Resting comfortably

## 2017-08-08 NOTE — Procedures (Signed)
Interventional Radiology Procedure Note  Procedure: Angiogram right leg, with angioplasty of the proximal AT and PT.  Unable to treat the distal tibial vessels/pedal vessels as dense coral-reef calcification precluded navigation of devices distally.  Drug-balloon angioplasty of the AT origin.     Complications: None  Recommendations:  - 4 hours right leg straight with bedrest - Gentle hydration - Plan for DC home in ~4 hours - Follow up with Dr. [name] in ~4 weeks. - Will discuss with Dr. [name].  Will discuss timing of plavix initiation, given possible surgical plan.  - Do not submerge for 7 days - Routine wound care   Signed,  Dulcy Fanny. [name], DO

## 2017-08-08 NOTE — Sedation Documentation (Signed)
Dressing to right groin dry and intact. Pulses present

## 2017-08-08 NOTE — Discharge Instructions (Addendum)
Femoral Site Care °Refer to this sheet in the next few weeks. These instructions provide you with information about caring for yourself after your procedure. Your health care provider may also give you more specific instructions. Your treatment has been planned according to current medical practices, but problems sometimes occur. Call your health care provider if you have any problems or questions after your procedure. °What can I expect after the procedure? °After your procedure, it is typical to have the following: °Bruising at the site that usually fades within 1-2 weeks. °Blood collecting in the tissue (hematoma) that may be painful to the touch. It should usually decrease in size and tenderness within 1-2 weeks. ° °Follow these instructions at home: °Take medicines only as directed by your health care provider. °You may shower 24-48 hours after the procedure or as directed by your health care provider. Remove the bandage (dressing) and gently wash the site with plain soap and water. Pat the area dry with a clean towel. Do not rub the site, because this may cause bleeding. °Do not take baths, swim, or use a hot tub until your health care provider approves. °Check your insertion site every day for redness, swelling, or drainage. °Do not apply powder or lotion to the site. °Limit use of stairs to twice a day for the first 2-3 days or as directed by your health care provider. °Do not squat for the first 2-3 days or as directed by your health care provider. °Do not lift over 10 lb (4.5 kg) for 5 days after your procedure or as directed by your health care provider. °Ask your health care provider when it is okay to: °Return to work or school. °Resume usual physical activities or sports. °Resume sexual activity. °Do not drive home if you are discharged the same day as the procedure. Have someone else drive you. °You may drive 24 hours after the procedure unless otherwise instructed by your health care provider. °Do  not operate machinery or power tools for 24 hours after the procedure or as directed by your health care provider. °If your procedure was done as an outpatient procedure, which means that you went home the same day as your procedure, a responsible adult should be with you for the first 24 hours after you arrive home. °Keep all follow-up visits as directed by your health care provider. This is important. °Contact a health care provider if: °You have a fever. °You have chills. °You have increased bleeding from the site. Hold pressure on the site. °Get help right away if: °You have unusual pain at the site. °You have redness, warmth, or swelling at the site. °You have drainage (other than a small amount of blood on the dressing) from the site. °The site is bleeding, and the bleeding does not stop after 30 minutes of holding steady pressure on the site. °Your leg or foot becomes pale, cool, tingly, or numb. °This information is not intended to replace advice given to you by your health care provider. Make sure you discuss any questions you have with your health care provider. °Document Released: 05/28/2014 Document Revised: 03/01/2016 Document Reviewed: 04/13/2014 °Elsevier Interactive Patient Education © 2018 [employer] °Angiogram, Care After °This sheet gives you information about how to care for yourself after your procedure. Your health care provider may also give you more specific instructions. If you have problems or questions, contact your health care provider. °What can I expect after the procedure? °After the procedure, it is common to have bruising   and tenderness at the catheter insertion area. °Follow these instructions at home: °Insertion site care °· Follow instructions from your health care provider about how to take care of your insertion site. Make sure you: °? Wash your hands with soap and water before you change your bandage (dressing). If soap and water are not available, use hand  sanitizer. °? Change your dressing as told by your health care provider. °? Leave stitches (sutures), skin glue, or adhesive strips in place. These skin closures may need to stay in place for 2 weeks or longer. If adhesive strip edges start to loosen and curl up, you may trim the loose edges. Do not remove adhesive strips completely unless your health care provider tells you to do that. °· Do not take baths, swim, or use a hot tub until your health care provider approves. °· You may shower 24-48 hours after the procedure or as told by your health care provider. °? Gently wash the site with plain soap and water. °? Pat the area dry with a clean towel. °? Do not rub the site. This may cause bleeding. °· Do not apply powder or lotion to the site. Keep the site clean and dry. °· Check your insertion site every day for signs of infection. Check for: °? Redness, swelling, or pain. °? Fluid or blood. °? Warmth. °? Pus or a bad smell. °Activity °· Rest as told by your health care provider, usually for 1-2 days. °· Do not lift anything that is heavier than 10 lbs. (4.5 kg) or as told by your health care provider. °· Do not drive for 24 hours if you were given a medicine to help you relax (sedative). °· Do not drive or use heavy machinery while taking prescription pain medicine. °General instructions °· Return to your normal activities as told by your health care provider, usually in about a week. Ask your health care provider what activities are safe for you. °· If the catheter site starts bleeding, lie flat and put pressure on the site. If the bleeding does not stop, get help right away. This is a medical emergency. °· Drink enough fluid to keep your urine clear or pale yellow. This helps flush the contrast dye from your body. °· Take over-the-counter and prescription medicines only as told by your health care provider. °· Keep all follow-up visits as told by your health care provider. This is important. °Contact a health  care provider if: °· You have a fever or chills. °· You have redness, swelling, or pain around your insertion site. °· You have fluid or blood coming from your insertion site. °· The insertion site feels warm to the touch. °· You have pus or a bad smell coming from your insertion site. °· You have bruising around the insertion site. °· You notice blood collecting in the tissue around the catheter site (hematoma). The hematoma may be painful to the touch. °Get help right away if: °· You have severe pain at the catheter insertion area. °· The catheter insertion area swells very fast. °· The catheter insertion area is bleeding, and the bleeding does not stop when you hold steady pressure on the area. °· The area near or just beyond the catheter insertion site becomes pale, cool, tingly, or numb. °These symptoms may represent a serious problem that is an emergency. Do not wait to see if the symptoms will go away. Get medical help right away. Call your local emergency services (911 in the [place] Do not   Do not wait to see if the symptoms will go away. Get medical help right away. Call your local emergency services (911 in the [place] Do not drive yourself to the hospital. Summary  After the procedure, it is common to have bruising and tenderness at the catheter insertion area.  After the procedure, it is important to rest and drink plenty of fluids.  Do not take baths, swim, or use a hot tub until your health care provider says it is okay to do so. You may shower 24-48 hours after the procedure or as told by your health care provider.  If the catheter site starts bleeding, lie flat and put pressure on the site. If the bleeding does not stop, get help right away. This is a medical emergency. This information is not intended to replace advice given to you by your health care provider. Make sure you discuss any questions you have with your health care provider. Document Released: 04/12/2005 Document Revised: 08/29/2016 Document Reviewed: 08/29/2016 Elsevier Interactive Patient Education  2017 [place]. Moderate Conscious Sedation, Adult, Care After These instructions provide you with information about caring for yourself after your procedure. Your health care provider may also give you more specific instructions. Your treatment has been planned according to current medical practices, but problems sometimes occur. Call your health care provider if you have any problems or questions after your procedure. What can I expect after the procedure? After your procedure, it is common:  To feel sleepy for several hours.  To feel clumsy and have poor balance for several hours.  To have poor judgment for several hours.  To vomit if you eat too soon.  Follow these instructions at home: For at least 24 hours after the procedure:   Do not: ? Participate in activities where you could fall or become injured. ? Drive. ? Use heavy machinery. ? Drink alcohol. ? Take sleeping pills or medicines that cause drowsiness. ? Make important decisions or sign legal documents. ? Take care of children on your own.  Rest. Eating and drinking  Follow the diet recommended by your health care provider.  If you vomit: ? Drink water, juice, or soup when you can drink without vomiting. ? Make sure you have little or no nausea before eating solid foods. General instructions  Have a responsible adult stay with you until you are awake and alert.  Take over-the-counter and prescription medicines only as told by your health care provider.  If you smoke, do not smoke without supervision.  Keep all follow-up visits as told by your health care provider. This is important. Contact a health care provider if:  You keep feeling nauseous or you keep vomiting.  You feel light-headed.  You develop a rash.  You have a fever. Get help right away if:  You have trouble breathing. This information is not intended to replace advice given to you by your health care provider. Make sure you discuss any questions  you have with your health care provider. Document Released: 07/15/2013 Document Revised: 02/27/2016 Document Reviewed: 01/14/2016 Elsevier Interactive Patient Education  [employer].

## 2017-08-08 NOTE — Sedation Documentation (Signed)
Right groin dressing dry and intact. Pulses present.

## 2017-08-08 NOTE — Telephone Encounter (Signed)
Marylou Mccoy, clinic supervisor, called [facility] and spoke to [place].  They were able to get the patient scheduled for a physical on November 5 with Vassie Moment, PA.  I called [facility] schedulers and rescheduled [name] surgery from November 5 at 7:30 am to November 7 at 3:00 pm at [place].  They did not have anything later than 1:30 pm on Monday.  Dr. [name] is not available on Tuesday to do the procedure.  The only other option was Wednesday afternoon.  Dr. [name] is making changes in his schedule to accommodate this surgery.  I called and informed Mrs. Hodak that [name] has an appointment scheduled on Monday at Dr. Pennie Banter office at 11:15 am and I changed his surgery date to Wednesday afternoon.  I told her someone from [facility] would call prior to surgery date with the arrival time.  She stated she didn't have anything to write the information down on.  I told her I would call her home phone and leave a message regarding the physical appointment and the surgery appointment   I called the patient's home phone number and left a message about his surgery date/time and about his physical examination with Vassie Moment.

## 2017-08-08 NOTE — Sedation Documentation (Signed)
Pt awake and stating he needs to move. Denies any pain. In no distress.

## 2017-08-08 NOTE — Sedation Documentation (Signed)
Called 2nd time to give report. Nurse unavailable. Will call back

## 2017-08-08 NOTE — Sedation Documentation (Signed)
6 Fr. Exoseal to right groin. 

## 2017-08-09 ENCOUNTER — Encounter (HOSPITAL_COMMUNITY): Payer: Self-pay

## 2017-08-09 ENCOUNTER — Emergency Department (HOSPITAL_COMMUNITY)
Admission: EM | Admit: 2017-08-09 | Discharge: 2017-08-09 | Disposition: A | Discharge status: Home / Self Care | Payer: Medicare Other | Attending: Emergency Medicine | Admitting: Emergency Medicine

## 2017-08-09 DIAGNOSIS — M79671 Pain in right foot: Secondary | ICD-10-CM | POA: Diagnosis not present

## 2017-08-09 NOTE — ED Triage Notes (Signed)
Pt endorses right foot pain for several weeks. Pt was here yesterday and states "they're supposed to take my toe off sometime but they haven't gotten around to it yet" Poor historian. VSS.

## 2017-08-09 NOTE — ED Notes (Signed)
Called pt on cell, pt not here and will be removed from our system LWBS after triage

## 2017-08-12 ENCOUNTER — Telehealth: Payer: Self-pay | Admitting: *Deleted

## 2017-08-12 ENCOUNTER — Telehealth: Payer: Self-pay | Admitting: Podiatry

## 2017-08-12 ENCOUNTER — Encounter: Payer: Self-pay | Admitting: *Deleted

## 2017-08-12 ENCOUNTER — Other Ambulatory Visit: Payer: Self-pay

## 2017-08-12 DIAGNOSIS — Z01818 Encounter for other preprocedural examination: Secondary | ICD-10-CM | POA: Diagnosis not present

## 2017-08-12 DIAGNOSIS — I482 Chronic atrial fibrillation: Secondary | ICD-10-CM | POA: Diagnosis not present

## 2017-08-12 DIAGNOSIS — I2510 Atherosclerotic heart disease of native coronary artery without angina pectoris: Secondary | ICD-10-CM | POA: Diagnosis not present

## 2017-08-12 MED ORDER — HYDROCODONE-ACETAMINOPHEN 5-325 MG PO TABS: 1.0000 | ORAL_TABLET | Freq: Four times a day (QID) | ORAL | 0 refills | Status: DC | PRN

## 2017-08-12 NOTE — Telephone Encounter (Signed)
Calling about where do I need to go now to get this surgery taken care of?

## 2017-08-12 NOTE — Telephone Encounter (Signed)
"  This is Vassie Moment, Nurse Practitioner,  I am calling regarding Derrick [name].  Derrick [name] and his wife are poor historians.  I am calling to get some information about him and try and find out what's going on with him."  Derrick [name]'s scheduled for an outpatient surgery on Wednesday, November 7.  Derrick [name]'s having a transmetatarsal amputation.  Derrick [name] has gangrene in his right foot.  "Will Derrick [name] be under anesthesia or awake."  Derrick [name] will be put to sleep.  "Derrick [name] has a cardiac condition and Derrick [name] takes Plavix.  Derrick [name] should have stopped taking it 5 days prior to surgery date."  We received cardiac clearance.  Dr. [name] said if was okay if Derrick [name] didn't stop the Plavix.  "We do not have a form to fill out.  Is it okay to just dictate a chart note and send it to you with a statement that Derrick [name]'s cleared for the surgery?"  Yes, that will be fine.  You can fax it to [phone].  "I'll get it to you after while."

## 2017-08-12 NOTE — Telephone Encounter (Signed)
I was calling to see if I could get a refill of the hydrocodone.

## 2017-08-12 NOTE — Telephone Encounter (Signed)
Ok to refill 

## 2017-08-12 NOTE — Telephone Encounter (Signed)
Please have him continue Plavix

## 2017-08-13 ENCOUNTER — Encounter (HOSPITAL_COMMUNITY): Payer: Self-pay | Admitting: *Deleted

## 2017-08-13 ENCOUNTER — Other Ambulatory Visit: Payer: Self-pay

## 2017-08-13 DIAGNOSIS — C61 Malignant neoplasm of prostate: Secondary | ICD-10-CM | POA: Diagnosis not present

## 2017-08-13 NOTE — Progress Notes (Signed)
I faxed a request to [facility] requesting H/P for Mr Oquendo.

## 2017-08-13 NOTE — Telephone Encounter (Signed)
"  This is Derrick [name], Derrick [name]'s son, I am calling to find out about his surgery that's supposed to be scheduled for tomorrow."  His surgery is scheduled for 3 pm at [place].  He'll need to be there 2 hours early.  He should receive a call from someone from the surgical center.  "We haven't heard anything yet, that's why we are calling."  Derrick [name] has had difficulty understanding.  Hopefully they will contact him soon.  "Okay, thank you for your help."

## 2017-08-13 NOTE — Telephone Encounter (Signed)
We received the history and physical information from Vassie Moment, NP.  I faxed it to [place].

## 2017-08-14 ENCOUNTER — Ambulatory Visit (HOSPITAL_COMMUNITY)
Admission: RE | Admit: 2017-08-14 | Discharge: 2017-08-14 | Disposition: A | Discharge status: Home / Self Care | Payer: Medicare Other | Source: Ambulatory Visit | Attending: Podiatry | Admitting: Podiatry

## 2017-08-14 ENCOUNTER — Encounter: Payer: Medicare Other | Admitting: Podiatry

## 2017-08-14 ENCOUNTER — Ambulatory Visit (HOSPITAL_COMMUNITY): Payer: Medicare Other

## 2017-08-14 ENCOUNTER — Encounter (HOSPITAL_COMMUNITY)
Admission: RE | Disposition: A | Discharge status: Home / Self Care | Payer: Self-pay | Source: Ambulatory Visit | Attending: Podiatry

## 2017-08-14 ENCOUNTER — Ambulatory Visit (HOSPITAL_COMMUNITY): Payer: Medicare Other | Admitting: Emergency Medicine

## 2017-08-14 ENCOUNTER — Encounter (HOSPITAL_COMMUNITY): Payer: Self-pay | Admitting: Certified Registered Nurse Anesthetist

## 2017-08-14 DIAGNOSIS — I96 Gangrene, not elsewhere classified: Secondary | ICD-10-CM | POA: Insufficient documentation

## 2017-08-14 DIAGNOSIS — I129 Hypertensive chronic kidney disease with stage 1 through stage 4 chronic kidney disease, or unspecified chronic kidney disease: Secondary | ICD-10-CM | POA: Insufficient documentation

## 2017-08-14 DIAGNOSIS — Y838 Other surgical procedures as the cause of abnormal reaction of the patient, or of later complication, without mention of misadventure at the time of the procedure: Secondary | ICD-10-CM | POA: Insufficient documentation

## 2017-08-14 DIAGNOSIS — M869 Osteomyelitis, unspecified: Secondary | ICD-10-CM | POA: Diagnosis not present

## 2017-08-14 DIAGNOSIS — K219 Gastro-esophageal reflux disease without esophagitis: Secondary | ICD-10-CM | POA: Diagnosis not present

## 2017-08-14 DIAGNOSIS — M1990 Unspecified osteoarthritis, unspecified site: Secondary | ICD-10-CM | POA: Insufficient documentation

## 2017-08-14 DIAGNOSIS — J449 Chronic obstructive pulmonary disease, unspecified: Secondary | ICD-10-CM | POA: Insufficient documentation

## 2017-08-14 DIAGNOSIS — Z7982 Long term (current) use of aspirin: Secondary | ICD-10-CM | POA: Diagnosis not present

## 2017-08-14 DIAGNOSIS — M86679 Other chronic osteomyelitis, unspecified ankle and foot: Secondary | ICD-10-CM | POA: Diagnosis not present

## 2017-08-14 DIAGNOSIS — Z9889 Other specified postprocedural states: Secondary | ICD-10-CM

## 2017-08-14 DIAGNOSIS — I2510 Atherosclerotic heart disease of native coronary artery without angina pectoris: Secondary | ICD-10-CM | POA: Insufficient documentation

## 2017-08-14 DIAGNOSIS — I25700 Atherosclerosis of coronary artery bypass graft(s), unspecified, with unstable angina pectoris: Secondary | ICD-10-CM | POA: Diagnosis not present

## 2017-08-14 DIAGNOSIS — Z87891 Personal history of nicotine dependence: Secondary | ICD-10-CM | POA: Insufficient documentation

## 2017-08-14 DIAGNOSIS — N183 Chronic kidney disease, stage 3 (moderate): Secondary | ICD-10-CM | POA: Diagnosis not present

## 2017-08-14 DIAGNOSIS — E7800 Pure hypercholesterolemia, unspecified: Secondary | ICD-10-CM | POA: Diagnosis not present

## 2017-08-14 DIAGNOSIS — Z899 Acquired absence of limb, unspecified: Secondary | ICD-10-CM | POA: Diagnosis not present

## 2017-08-14 DIAGNOSIS — T8149XA Infection following a procedure, other surgical site, initial encounter: Secondary | ICD-10-CM | POA: Insufficient documentation

## 2017-08-14 DIAGNOSIS — M868X7 Other osteomyelitis, ankle and foot: Secondary | ICD-10-CM | POA: Insufficient documentation

## 2017-08-14 DIAGNOSIS — I739 Peripheral vascular disease, unspecified: Secondary | ICD-10-CM | POA: Diagnosis not present

## 2017-08-14 DIAGNOSIS — I771 Stricture of artery: Secondary | ICD-10-CM | POA: Diagnosis not present

## 2017-08-14 DIAGNOSIS — M87874 Other osteonecrosis, right foot: Secondary | ICD-10-CM | POA: Diagnosis not present

## 2017-08-14 HISTORY — DX: Other amnesia: R41.3

## 2017-08-14 HISTORY — PX: TRANSMETATARSAL AMPUTATION: SHX6197

## 2017-08-14 SURGERY — AMPUTATION, FOOT, TRANSMETATARSAL
Anesthesia: General | Site: Foot | Laterality: Right

## 2017-08-14 MED ORDER — CEFAZOLIN SODIUM-DEXTROSE 2-4 GM/100ML-% IV SOLN
2.0000 g | INTRAVENOUS | Status: AC
  Administered 2017-08-14: 2 g via INTRAVENOUS

## 2017-08-14 MED ORDER — LIDOCAINE HCL (CARDIAC) 20 MG/ML IV SOLN
INTRAVENOUS | Status: DC | PRN
  Administered 2017-08-14: 100 mg via INTRAVENOUS

## 2017-08-14 MED ORDER — DEXAMETHASONE SODIUM PHOSPHATE 10 MG/ML IJ SOLN
INTRAMUSCULAR | Status: AC
  Filled 2017-08-14: qty 1

## 2017-08-14 MED ORDER — PROPOFOL 10 MG/ML IV BOLUS
INTRAVENOUS | Status: AC
  Filled 2017-08-14: qty 20

## 2017-08-14 MED ORDER — VANCOMYCIN HCL 500 MG IV SOLR
INTRAVENOUS | Status: AC
  Filled 2017-08-14: qty 500

## 2017-08-14 MED ORDER — MIDAZOLAM HCL 2 MG/2ML IJ SOLN
INTRAMUSCULAR | Status: AC
  Filled 2017-08-14: qty 2

## 2017-08-14 MED ORDER — PROPOFOL 10 MG/ML IV BOLUS
INTRAVENOUS | Status: DC | PRN
  Administered 2017-08-14: 150 mg via INTRAVENOUS

## 2017-08-14 MED ORDER — VANCOMYCIN HCL 500 MG IV SOLR
INTRAVENOUS | Status: DC | PRN
  Administered 2017-08-14: 500 mg via TOPICAL

## 2017-08-14 MED ORDER — FENTANYL CITRATE (PF) 100 MCG/2ML IJ SOLN
INTRAMUSCULAR | Status: AC
  Filled 2017-08-14: qty 2

## 2017-08-14 MED ORDER — PHENYLEPHRINE 40 MCG/ML (10ML) SYRINGE FOR IV PUSH (FOR BLOOD PRESSURE SUPPORT)
PREFILLED_SYRINGE | INTRAVENOUS | Status: AC
  Filled 2017-08-14: qty 10

## 2017-08-14 MED ORDER — ONDANSETRON HCL 4 MG/2ML IJ SOLN
INTRAMUSCULAR | Status: AC
  Filled 2017-08-14: qty 2

## 2017-08-14 MED ORDER — PHENYLEPHRINE HCL 10 MG/ML IJ SOLN
INTRAMUSCULAR | Status: DC | PRN
  Administered 2017-08-14: 20 ug/min via INTRAVENOUS

## 2017-08-14 MED ORDER — LACTATED RINGERS IV SOLN
INTRAVENOUS | Status: DC
  Administered 2017-08-14 (×3): via INTRAVENOUS

## 2017-08-14 MED ORDER — DEXAMETHASONE SODIUM PHOSPHATE 10 MG/ML IJ SOLN
INTRAMUSCULAR | Status: DC | PRN
  Administered 2017-08-14: 5 mg via INTRAVENOUS

## 2017-08-14 MED ORDER — PHENYLEPHRINE HCL 10 MG/ML IJ SOLN
INTRAMUSCULAR | Status: DC | PRN
  Administered 2017-08-14: 80 ug via INTRAVENOUS
  Administered 2017-08-14: 120 ug via INTRAVENOUS
  Administered 2017-08-14: 80 ug via INTRAVENOUS

## 2017-08-14 MED ORDER — BUPIVACAINE HCL (PF) 0.5 % IJ SOLN
INTRAMUSCULAR | Status: DC | PRN
  Administered 2017-08-14: 20 mL

## 2017-08-14 MED ORDER — FENTANYL CITRATE (PF) 100 MCG/2ML IJ SOLN
INTRAMUSCULAR | Status: DC | PRN
  Administered 2017-08-14: 25 ug via INTRAVENOUS
  Administered 2017-08-14: 50 ug via INTRAVENOUS

## 2017-08-14 MED ORDER — BUPIVACAINE HCL (PF) 0.5 % IJ SOLN
INTRAMUSCULAR | Status: AC
  Filled 2017-08-14: qty 30

## 2017-08-14 MED ORDER — OXYCODONE-ACETAMINOPHEN 10-325 MG PO TABS: 1.0000 | ORAL_TABLET | Freq: Four times a day (QID) | ORAL | 0 refills | Status: DC | PRN

## 2017-08-14 MED ORDER — FENTANYL CITRATE (PF) 250 MCG/5ML IJ SOLN
INTRAMUSCULAR | Status: AC
  Filled 2017-08-14: qty 5

## 2017-08-14 MED ORDER — CEFAZOLIN SODIUM-DEXTROSE 2-4 GM/100ML-% IV SOLN
INTRAVENOUS | Status: AC
  Filled 2017-08-14: qty 100

## 2017-08-14 MED ORDER — ONDANSETRON HCL 4 MG/2ML IJ SOLN
INTRAMUSCULAR | Status: DC | PRN
  Administered 2017-08-14: 4 mg via INTRAVENOUS

## 2017-08-14 MED ORDER — 0.9 % SODIUM CHLORIDE (POUR BTL) OPTIME
TOPICAL | Status: DC | PRN
  Administered 2017-08-14: 1000 mL

## 2017-08-14 MED ORDER — SODIUM CHLORIDE 0.9 % IR SOLN
Status: DC | PRN
  Administered 2017-08-14: 3000 mL

## 2017-08-14 MED ORDER — CEPHALEXIN 500 MG PO CAPS: 500.0000 mg | ORAL_CAPSULE | Freq: Two times a day (BID) | ORAL | 0 refills | Status: DC

## 2017-08-14 SURGICAL SUPPLY — 48 items
APL SKNCLS STERI-STRIP NONHPOA (GAUZE/BANDAGES/DRESSINGS)
BANDAGE ELASTIC 4 VELCRO ST LF (GAUZE/BANDAGES/DRESSINGS) ×2 IMPLANT
BENZOIN TINCTURE PRP APPL 2/3 (GAUZE/BANDAGES/DRESSINGS) ×1 IMPLANT
BLADE AVERAGE 25MMX9MM (BLADE) ×2
BLADE AVERAGE 25X9 (BLADE) ×2 IMPLANT
BLADE SAW SGTL HD 18.5X60.5X1. (BLADE) ×1 IMPLANT
BLADE SURG 10 STRL SS (BLADE) ×2 IMPLANT
BLADE SURG 21 STRL SS (BLADE) ×1 IMPLANT
BNDG COHESIVE 4X5 TAN STRL (GAUZE/BANDAGES/DRESSINGS) IMPLANT
BNDG GAUZE ELAST 4 BULKY (GAUZE/BANDAGES/DRESSINGS) IMPLANT
CONT SPEC 4OZ CLIKSEAL STRL BL (MISCELLANEOUS) ×4 IMPLANT
COVER SURGICAL LIGHT HANDLE (MISCELLANEOUS) ×3 IMPLANT
DRAPE INCISE IOBAN 66X45 STRL (DRAPES) ×1 IMPLANT
DRAPE U-SHAPE 47X51 STRL (DRAPES) ×3 IMPLANT
DRSG ADAPTIC 3X8 NADH LF (GAUZE/BANDAGES/DRESSINGS) ×2 IMPLANT
DRSG PAD ABDOMINAL 8X10 ST (GAUZE/BANDAGES/DRESSINGS) IMPLANT
DURAPREP 26ML APPLICATOR (WOUND CARE) ×1 IMPLANT
ELECT REM PT RETURN 9FT ADLT (ELECTROSURGICAL) ×3
ELECTRODE REM PT RTRN 9FT ADLT (ELECTROSURGICAL) ×1 IMPLANT
GAUZE SPONGE 4X4 12PLY STRL (GAUZE/BANDAGES/DRESSINGS) IMPLANT
GLOVE BIOGEL PI IND STRL 9 (GLOVE) ×1 IMPLANT
GLOVE BIOGEL PI INDICATOR 9 (GLOVE) ×2
GLOVE SURG ORTHO 9.0 STRL STRW (GLOVE) ×3 IMPLANT
GOWN STRL REUS W/ TWL XL LVL3 (GOWN DISPOSABLE) ×3 IMPLANT
GOWN STRL REUS W/TWL XL LVL3 (GOWN DISPOSABLE) ×9
KIT BASIN OR (CUSTOM PROCEDURE TRAY) ×3 IMPLANT
KIT DRSG PREVENA PLUS 7DAY 125 (MISCELLANEOUS) ×2 IMPLANT
KIT PREVENA INCISION MGT 13 (CANNISTER) ×2 IMPLANT
KIT ROOM TURNOVER OR (KITS) ×3 IMPLANT
NDL 18GX1X1/2 (RX/OR ONLY) (NEEDLE) IMPLANT
NEEDLE 18GX1X1/2 (RX/OR ONLY) (NEEDLE) ×3 IMPLANT
NS IRRIG 1000ML POUR BTL (IV SOLUTION) ×3 IMPLANT
PACK ORTHO EXTREMITY (CUSTOM PROCEDURE TRAY) ×3 IMPLANT
PAD ARMBOARD 7.5X6 YLW CONV (MISCELLANEOUS) ×6 IMPLANT
PADDING CAST COTTON 6X4 STRL (CAST SUPPLIES) ×2 IMPLANT
PREVENA PEEL & PLACE DRESSING 13CM IMPLANT
SET CYSTO W/LG BORE CLAMP LF (SET/KITS/TRAYS/PACK) ×2 IMPLANT
SPONGE LAP 18X18 X RAY DECT (DISPOSABLE) ×2 IMPLANT
SUT ETHILON 2 0 PSLX (SUTURE) ×2 IMPLANT
SUT ETHILON 3 0 FSL (SUTURE) ×6 IMPLANT
SUT VIC AB 2-0 CTB1 (SUTURE) IMPLANT
SYR CONTROL 10ML LL (SYRINGE) ×4 IMPLANT
TOWEL OR 17X24 6PK STRL BLUE (TOWEL DISPOSABLE) ×3 IMPLANT
TOWEL OR 17X26 10 PK STRL BLUE (TOWEL DISPOSABLE) ×3 IMPLANT
TUBE CONNECTING 12'X1/4 (SUCTIONS) ×1
TUBE CONNECTING 12X1/4 (SUCTIONS) ×2 IMPLANT
WATER STERILE IRR 1000ML POUR (IV SOLUTION) ×1 IMPLANT
YANKAUER SUCT BULB TIP NO VENT (SUCTIONS) ×3 IMPLANT

## 2017-08-14 NOTE — Anesthesia Preprocedure Evaluation (Signed)
Anesthesia Evaluation  Patient identified by MRN, date of birth, ID band Patient awake    Reviewed: Allergy & Precautions, H&P , NPO status , Patient's Chart, lab work & pertinent test results  Airway Mallampati: II   Neck ROM: full    Dental   Pulmonary shortness of breath, COPD, former smoker,    breath sounds clear to auscultation       Cardiovascular hypertension, + angina + CAD   Rhythm:regular Rate:Normal     Neuro/Psych PSYCHIATRIC DISORDERS Anxiety Depression  Neuromuscular disease    GI/Hepatic hiatal hernia, GERD  ,  Endo/Other    Renal/GU Renal InsufficiencyRenal disease     Musculoskeletal  (+) Arthritis ,   Abdominal   Peds  Hematology   Anesthesia Other Findings   Reproductive/Obstetrics                             Anesthesia Physical Anesthesia Plan  ASA: III  Anesthesia Plan: General   Post-op Pain Management:    Induction: Intravenous  PONV Risk Score and Plan: 2 and Treatment may vary due to age or medical condition  Airway Management Planned: LMA  Additional Equipment:   Intra-op Plan:   Post-operative Plan: Extubation in OR  Informed Consent: I have reviewed the patients History and Physical, chart, labs and discussed the procedure including the risks, benefits and alternatives for the proposed anesthesia with the patient or authorized representative who has indicated his/her understanding and acceptance.   Dental advisory given  Plan Discussed with: CRNA  Anesthesia Plan Comments:         Anesthesia Quick Evaluation

## 2017-08-14 NOTE — Brief Op Note (Signed)
08/14/2017  4:59 PM  PATIENT:  Derrick [name]  83 y.o. male  PRE-OPERATIVE DIAGNOSIS:  gangrene  POST-OPERATIVE DIAGNOSIS:  gangrene  PROCEDURE:  Procedure(s): TRANSMETATARSAL AMPUTATION (Right)  SURGEON:  Surgeon(s) and Role:    * [name], DPM - Primary  PHYSICIAN ASSISTANT:   ASSISTANTS: none   ANESTHESIA:   general  EBL:  40 mL   BLOOD ADMINISTERED:none  DRAINS: Prevena Wound VAC   LOCAL MEDICATIONS USED:  MARCAINE    and Amount: 20 ml  SPECIMEN:  Source of Specimen:  R Foot for path; R foot bone for micro  DISPOSITION OF SPECIMEN:  Micro, path  COUNTS:  YES  TOURNIQUET:  * No tourniquets in log *  DICTATION: .Note written in EPIC  PLAN OF CARE: Discharge to home after PACU  PATIENT DISPOSITION:  PACU - hemodynamically stable.   Delay start of Pharmacological VTE agent (>24hrs) due to surgical blood loss or risk of bleeding: no

## 2017-08-14 NOTE — Anesthesia Procedure Notes (Signed)
Procedure Name: LMA Insertion Date/Time: 08/14/2017 3:26 PM Performed by: Inda Coke, CRNA Pre-anesthesia Checklist: Patient identified, Emergency Drugs available, Suction available and Patient being monitored Patient Re-evaluated:Patient Re-evaluated prior to induction Oxygen Delivery Method: Circle System Utilized Preoxygenation: Pre-oxygenation with 100% oxygen Induction Type: IV induction Ventilation: Mask ventilation without difficulty LMA: LMA inserted LMA Size: 5.0 Number of attempts: 1 Airway Equipment and Method: Bite block Placement Confirmation: positive ETCO2 and breath sounds checked- equal and bilateral Tube secured with: Tape Dental Injury: Teeth and Oropharynx as per pre-operative assessment

## 2017-08-14 NOTE — Interval H&P Note (Signed)
History and Physical Interval Note:  08/14/2017 3:04 PM  Derrick [name]  has presented today for surgery, with the diagnosis of gangrene  The various methods of treatment have been discussed with the patient and family. After consideration of risks, benefits and other options for treatment, the patient has consented to  Procedure(s): TRANSMETATARSAL AMPUTATION (Right) as a surgical intervention .  The patient's history has been reviewed, patient examined, no change in status, stable for surgery.  I have reviewed the patient's chart and labs.  Questions were answered to the patient's satisfaction.     [name]

## 2017-08-14 NOTE — Transfer of Care (Signed)
Immediate Anesthesia Transfer of Care Note  Patient: Derrick [name]  Procedure(s) Performed: TRANSMETATARSAL AMPUTATION (Right Foot)  Patient Location: PACU  Anesthesia Type:General  Level of Consciousness: awake and alert   Airway & Oxygen Therapy: Patient Spontanous Breathing and Patient connected to nasal cannula oxygen  Post-op Assessment: Report given to RN, Post -op Vital signs reviewed and stable and Patient moving all extremities X 4  Post vital signs: Reviewed and stable  Last Vitals:  Vitals:   08/14/17 1334 08/14/17 1649  BP: 119/81   Pulse: 91   Resp: 20   Temp: 36.8 C 36.6 C  SpO2: 97%     Last Pain:  Vitals:   08/14/17 1334  TempSrc: Oral  PainSc:       Patients Stated Pain Goal: 3 (41/74/08 1448)  Complications: No apparent anesthesia complications

## 2017-08-14 NOTE — Op Note (Signed)
Patient Name: BASIM BARTNIK DOB: 1932/04/29  MRN: [id number]  Date of Service: 08/14/17  Surgeon: Dr. [name], DPM Assistants: None Pre-operative Diagnosis: Gangrene R 3rd toe; Osteomyelitis R 1st Metatarsal Post-operative Diagnosis: same Procedures:             1) Transmetatarsal Amputation R Foot Pathology/Specimens:             1) R Forefoot - Gross Pathology  2) R 1st Metatarsal Bone - Microbiology Anesthesia: General Hemostasis: Anatomic Estimated Blood Loss: 100 ml Materials: None Medications: 20 ccs 0.5% Marcaine ankle block. Complications: None  Indications for Procedure: This is a 83 year old male with a chief complaint of right foot gangrene.  He has underwent right first and second palpitations but had wound healing issues due to peripheral arterial disease.  He additionally experienced worsening gangrene of the right third digit. The wound over the first metatarsal was found to probe bone with erosion of the metatarsal bone noted . It was discussed with the patient that he would benefit from transmetatarsal amputation. In order to improve his chance of healing the amputation he recently underwent attempted revascularization with stenting of the arteries in his leg.  We discussed that there is no guarantees that the wound will heal at a trans-metatarsal level.  All risk benefits alternatives were splinted the patient no guarantees were given; patient elected to proceed.  Procedure in Detail: Patient was identified in pre-operative holding area. Formal consent was signed and the right lower extremity was marked. Patient was brought back to the operating room and placed on the operating room table in the supine position. Anesthesia was induced. The right lower extremity was prepped and draped in the usual sterile fashion. Timeout was taken to confirm patient name, laterality, and procedure prior to incision. Attention was then directed to the right foot where a fishmouth  incision was made at the level of the metatarsophalangeal joints.  Dissection was carried down through skin and subcu tissue.  Good bleeding tissue was noted dissection was carried down to the level of the metatarsal bones.  The first metatarsal head was noted to be gray in appearance. The remaining forefoot was disarticulated at the metatarsal phalangeal joint.  Full-thickness periosteal and subcutaneous tissue flaps were raised dorsally and plantarly at the metatarsal bones.  Once the metatarsal bones were adequately exposed, a sagittal saw was used to excise each of the metatarsal heads with care to retain the metatarsal parabola. The metatarsal heads were removed.the sesamoid complex was carefully excised . The metatarsal ends were smoothed with a rasp. All plantar plates were excised and all visible tendons were cut under tension to allow retraction . The area was thoroughly irrigated 3 L normal saline.  A new sagittal saw blade was used to take a sample of the first metatarsal for microbiology as a clean margin.  The skin flaps were then closed with 3-0 nylon with good apposition of the skin flaps.  A Praveena wound VAC was then applied.  It was then dressed with cast padding and Ace bandage.  The foot was then dressed with 4x4, kerlix, and ACE bandage. Patient tolerated the procedure well.  Disposition: Following a period of post-operative monitoring, patient will be transferred back home.

## 2017-08-15 ENCOUNTER — Encounter (HOSPITAL_COMMUNITY): Payer: Self-pay | Admitting: Podiatry

## 2017-08-15 ENCOUNTER — Telehealth: Payer: Self-pay | Admitting: Podiatry

## 2017-08-15 NOTE — Anesthesia Postprocedure Evaluation (Signed)
Anesthesia Post Note  Patient: Derrick [name]  Procedure(s) Performed: TRANSMETATARSAL AMPUTATION (Right Foot)     Patient location during evaluation: PACU Anesthesia Type: General Level of consciousness: awake and alert Pain management: pain level controlled Vital Signs Assessment: post-procedure vital signs reviewed and stable Respiratory status: spontaneous breathing, nonlabored ventilation, respiratory function stable and patient connected to nasal cannula oxygen Cardiovascular status: blood pressure returned to baseline and stable Postop Assessment: no apparent nausea or vomiting Anesthetic complications: no    Last Vitals:  Vitals:   08/14/17 1649 08/14/17 1733  BP:    Pulse:    Resp:    Temp: 36.6 C 36.7 C  SpO2:      Last Pain:  Vitals:   08/14/17 1733  TempSrc:   PainSc: 0-No pain                 Kathelene Rumberger,JAMES TERRILL

## 2017-08-16 ENCOUNTER — Encounter: Payer: Medicare Other | Admitting: Podiatry

## 2017-08-16 ENCOUNTER — Ambulatory Visit (INDEPENDENT_AMBULATORY_CARE_PROVIDER_SITE_OTHER): Payer: Medicare Other | Admitting: Podiatry

## 2017-08-16 DIAGNOSIS — I96 Gangrene, not elsewhere classified: Secondary | ICD-10-CM

## 2017-08-16 DIAGNOSIS — I739 Peripheral vascular disease, unspecified: Secondary | ICD-10-CM | POA: Diagnosis not present

## 2017-08-16 LAB — ACID FAST SMEAR (AFB, MYCOBACTERIA): Acid Fast Smear: NEGATIVE

## 2017-08-16 LAB — ACID FAST SMEAR (AFB)

## 2017-08-18 NOTE — Progress Notes (Signed)
Subjective: Derrick [name] is a 83 y.o. is seen today in office s/p right TMA with incisional wound VAC preformed on 08/14/2017 with Dr. [name]. They state their pain is improving.  Patient's family states that he has been putting weight on his foot as he needs to because of fear of falling with crutches.  He tried getting a knee scooter but insurance did not cover this.  He did use a walker today which is more helpful for him.  Denies any systemic complaints such as fevers, chills, nausea, vomiting. No calf pain, chest pain, shortness of breath.   Objective: General: No acute distress, AAOx3  DP/PT pulses palpable 2/4, CRT < 3 sec to all digits.  Protective sensation intact. Motor function intact.  Incisional wound VAC along the incision for right transmetatarsal amputation site which appears to be functioning correctly.  There does not appear to be any skin breakdown upon evaluation.  As the wound VAC appeared to be functioning correctly we will leave this on until Monday. No other areas of tenderness to bilateral lower extremities.  No other open lesions or pre-ulcerative lesions.  No pain with calf compression, swelling, warmth, erythema.   Assessment and Plan:  Status post right transmetatarsal amputation with incisional wound VAC, doing well with no complications   -Treatment options discussed including all alternatives, risks, and complications -I left the wound VAC in place.  We will plan on removing this on Monday.  I dispensed a Darco wedge shoe as he has been putting weight to his foot but hopefully this will help take some pressure off the forefoot.  I want him to continue with the walker to help prevent falls as well.  Recommend nonweightbearing as much as possible.  On Monday -Ice/elevation -Pain medication as needed. -Monitor for any clinical signs or symptoms of infection and DVT/PE and directed to call the office immediately should any occur or go to the ER. -Follow-up on Monday or  sooner if any problems arise. In the meantime, encouraged to call the office with any questions, concerns, change in symptoms.   Celesta Gentile, DPM

## 2017-08-19 ENCOUNTER — Telehealth: Payer: Self-pay | Admitting: Podiatry

## 2017-08-19 ENCOUNTER — Telehealth: Payer: Self-pay | Admitting: *Deleted

## 2017-08-19 ENCOUNTER — Ambulatory Visit (INDEPENDENT_AMBULATORY_CARE_PROVIDER_SITE_OTHER): Payer: Medicare Other | Admitting: Podiatry

## 2017-08-19 ENCOUNTER — Encounter: Payer: Medicare Other | Admitting: Podiatry

## 2017-08-19 VITALS — BP 121/74 | HR 98

## 2017-08-19 DIAGNOSIS — I96 Gangrene, not elsewhere classified: Secondary | ICD-10-CM

## 2017-08-19 DIAGNOSIS — I739 Peripheral vascular disease, unspecified: Secondary | ICD-10-CM

## 2017-08-19 MED ORDER — CLINDAMYCIN HCL 300 MG PO CAPS: 300.0000 mg | ORAL_CAPSULE | Freq: Three times a day (TID) | ORAL | 0 refills | Status: DC

## 2017-08-19 MED ORDER — CIPROFLOXACIN HCL 250 MG PO TABS: 250.0000 mg | ORAL_TABLET | Freq: Two times a day (BID) | ORAL | 0 refills | Status: DC

## 2017-08-19 NOTE — Telephone Encounter (Signed)
[facility] pharmacist states pt's bone culture grew out staph aureus and another organism that is resistant to Keflex and the Doxycycline. I spoke with [facility] and she faxed the preliminary results of the bone culture. I faxed the results to [place] and informed Malachy Mood the receptionist, the labs needed to be given to Dr. [name] stat. Dr. [name] called and states he will order 2 different antibiotics and will send to the pt's pharmacy, and for me to contact pt to get the rxs filled. Left message on home and mobile phones for pt and wife to pick up the prescriptions at [facility] and to call and confirm they had received these message.

## 2017-08-19 NOTE — Telephone Encounter (Signed)
Rx for Clinda/Cipro d/t + Bone Cultures. Will discuss more with patient at next visit. Please inform.

## 2017-08-20 ENCOUNTER — Encounter (HOSPITAL_BASED_OUTPATIENT_CLINIC_OR_DEPARTMENT_OTHER): Payer: Medicare Other

## 2017-08-20 LAB — AEROBIC/ANAEROBIC CULTURE W GRAM STAIN (SURGICAL/DEEP WOUND)

## 2017-08-20 LAB — AEROBIC/ANAEROBIC CULTURE (SURGICAL/DEEP WOUND)

## 2017-08-20 NOTE — Progress Notes (Signed)
Subjective: Derrick [name] is a 83 y.o. is seen today in office s/p right TMA with incisional wound VAC preformed on 08/14/2017 with Dr. [name].  He presents today for a wound VAC removal.  He states he is doing well.  Some occasional sharp pains but overall is improving.  He has been tolerating the pain medication well without any side effects.  He states that he could not be nonweightbearing he has been putting weight through his foot with a Darco wedge shoe has been helping.  He presents today using a walker as well.  Denies any systemic complaints such as fevers, chills, nausea, vomiting. No calf pain, chest pain, shortness of breath.   Objective: General: No acute distress, AAOx3  DP/PT pulses palpable 2/4, CRT < 3 sec to all digits.  Protective sensation intact. Motor function intact.  Incisional wound VAC along the incision for right transmetatarsal amputation site which appears to be functioning correctly.  There is macerated skin mostly along the plantar flap.  There is no drainage or pus expressed.  Mild tenderness palpation along the foot.  There is no erythema or increasing warmth.  There is no clinical signs of infection present today. No other areas of tenderness to bilateral lower extremities.  No other open lesions or pre-ulcerative lesions.  No pain with calf compression, swelling, warmth, erythema.   Assessment and Plan:  Status post right transmetatarsal amputation  -Treatment options discussed including all alternatives, risks, and complications -Wound VAC was removed today without complications.  Given the macerated tissue a small amount of Betadine was applied to the area followed by dry sterile dressing.  Keep the dressing clean, dry, intact.  We will see him back later this week for dressing change and evaluation of the wound.  Recommended nonweightbearing but given safety concerns he has been weightbearing in a Darco wedge shoe when he does have to walk.  He is also been using a  walker. -Elevation -Pain medication as needed. -Monitor for any clinical signs or symptoms of infection and DVT/PE and directed to call the office immediately should any occur or go to the ER. -Follow-up as scheduled sooner if any problems arise. In the meantime, encouraged to call the office with any questions, concerns, change in symptoms.   Celesta Gentile, DPM

## 2017-08-21 ENCOUNTER — Encounter: Payer: Medicare Other | Admitting: Podiatry

## 2017-08-21 ENCOUNTER — Telehealth: Payer: Self-pay | Admitting: Podiatry

## 2017-08-21 DIAGNOSIS — C61 Malignant neoplasm of prostate: Secondary | ICD-10-CM | POA: Diagnosis not present

## 2017-08-21 DIAGNOSIS — N401 Enlarged prostate with lower urinary tract symptoms: Secondary | ICD-10-CM | POA: Diagnosis not present

## 2017-08-21 MED ORDER — OXYCODONE-ACETAMINOPHEN 10-325 MG PO TABS: 1.0000 | ORAL_TABLET | Freq: Four times a day (QID) | ORAL | 0 refills | Status: DC | PRN

## 2017-08-21 NOTE — Addendum Note (Signed)
Addended by: Harriett Sine D on: 08/21/2017 11:07 AM   Modules accepted: Orders

## 2017-08-21 NOTE — Telephone Encounter (Addendum)
Dr. Jacqualyn Posey ordered refill as previously. Pt presented to office for rx pick up.

## 2017-08-21 NOTE — Telephone Encounter (Signed)
I need a refill of my pain medication. I'm almost out of what I have. The phone number is [phone]. Thank you.

## 2017-08-22 ENCOUNTER — Ambulatory Visit (INDEPENDENT_AMBULATORY_CARE_PROVIDER_SITE_OTHER): Payer: Medicare Other | Admitting: Podiatry

## 2017-08-22 DIAGNOSIS — Z9889 Other specified postprocedural states: Secondary | ICD-10-CM

## 2017-08-22 MED ORDER — HYDROMORPHONE HCL 2 MG PO TABS: 2.0000 mg | ORAL_TABLET | ORAL | 0 refills | Status: DC | PRN

## 2017-08-23 ENCOUNTER — Telehealth: Payer: Self-pay | Admitting: Podiatry

## 2017-08-23 DIAGNOSIS — Z89421 Acquired absence of other right toe(s): Secondary | ICD-10-CM | POA: Diagnosis not present

## 2017-08-23 DIAGNOSIS — I2510 Atherosclerotic heart disease of native coronary artery without angina pectoris: Secondary | ICD-10-CM | POA: Diagnosis not present

## 2017-08-23 DIAGNOSIS — Z9181 History of falling: Secondary | ICD-10-CM | POA: Diagnosis not present

## 2017-08-23 DIAGNOSIS — N183 Chronic kidney disease, stage 3 (moderate): Secondary | ICD-10-CM | POA: Diagnosis not present

## 2017-08-23 DIAGNOSIS — K219 Gastro-esophageal reflux disease without esophagitis: Secondary | ICD-10-CM | POA: Diagnosis not present

## 2017-08-23 DIAGNOSIS — N400 Enlarged prostate without lower urinary tract symptoms: Secondary | ICD-10-CM | POA: Diagnosis not present

## 2017-08-23 DIAGNOSIS — J449 Chronic obstructive pulmonary disease, unspecified: Secondary | ICD-10-CM | POA: Diagnosis not present

## 2017-08-23 DIAGNOSIS — Z4781 Encounter for orthopedic aftercare following surgical amputation: Secondary | ICD-10-CM | POA: Diagnosis not present

## 2017-08-23 DIAGNOSIS — D631 Anemia in chronic kidney disease: Secondary | ICD-10-CM | POA: Diagnosis not present

## 2017-08-23 DIAGNOSIS — Z89411 Acquired absence of right great toe: Secondary | ICD-10-CM | POA: Diagnosis not present

## 2017-08-23 DIAGNOSIS — I129 Hypertensive chronic kidney disease with stage 1 through stage 4 chronic kidney disease, or unspecified chronic kidney disease: Secondary | ICD-10-CM | POA: Diagnosis not present

## 2017-08-23 NOTE — Telephone Encounter (Signed)
This is Rondel Baton with [facility]. I'm at the pt's home for routine wound care on his TMA and I want to make sure of the actual protocol you would like for [place] to follow. The pt is using santyl ointment and I didn't see any orders that had been sent to [place] since the revision. If you would please call me back at [phone]. Thank you.

## 2017-08-23 NOTE — Telephone Encounter (Signed)
Left message informing Derrick [name] - [facility], Dr. [name] had ordered dressing changes every other day, if area is macerated to paint with betadine and apply a dry sterile dressing.

## 2017-08-26 DIAGNOSIS — D631 Anemia in chronic kidney disease: Secondary | ICD-10-CM | POA: Diagnosis not present

## 2017-08-26 DIAGNOSIS — Z4781 Encounter for orthopedic aftercare following surgical amputation: Secondary | ICD-10-CM | POA: Diagnosis not present

## 2017-08-26 DIAGNOSIS — J449 Chronic obstructive pulmonary disease, unspecified: Secondary | ICD-10-CM | POA: Diagnosis not present

## 2017-08-26 DIAGNOSIS — Z89411 Acquired absence of right great toe: Secondary | ICD-10-CM | POA: Diagnosis not present

## 2017-08-26 DIAGNOSIS — N400 Enlarged prostate without lower urinary tract symptoms: Secondary | ICD-10-CM | POA: Diagnosis not present

## 2017-08-26 DIAGNOSIS — I129 Hypertensive chronic kidney disease with stage 1 through stage 4 chronic kidney disease, or unspecified chronic kidney disease: Secondary | ICD-10-CM | POA: Diagnosis not present

## 2017-08-26 DIAGNOSIS — Z89421 Acquired absence of other right toe(s): Secondary | ICD-10-CM | POA: Diagnosis not present

## 2017-08-26 DIAGNOSIS — I2510 Atherosclerotic heart disease of native coronary artery without angina pectoris: Secondary | ICD-10-CM | POA: Diagnosis not present

## 2017-08-26 DIAGNOSIS — N183 Chronic kidney disease, stage 3 (moderate): Secondary | ICD-10-CM | POA: Diagnosis not present

## 2017-08-26 DIAGNOSIS — K219 Gastro-esophageal reflux disease without esophagitis: Secondary | ICD-10-CM | POA: Diagnosis not present

## 2017-08-27 ENCOUNTER — Ambulatory Visit (INDEPENDENT_AMBULATORY_CARE_PROVIDER_SITE_OTHER): Payer: Medicare Other | Admitting: Podiatry

## 2017-08-27 ENCOUNTER — Telehealth: Payer: Self-pay | Admitting: Podiatry

## 2017-08-27 VITALS — BP 116/63 | HR 90 | Temp 97.8°F

## 2017-08-27 DIAGNOSIS — L03119 Cellulitis of unspecified part of limb: Secondary | ICD-10-CM

## 2017-08-27 DIAGNOSIS — L02619 Cutaneous abscess of unspecified foot: Secondary | ICD-10-CM

## 2017-08-27 DIAGNOSIS — T8130XA Disruption of wound, unspecified, initial encounter: Secondary | ICD-10-CM

## 2017-08-27 DIAGNOSIS — I739 Peripheral vascular disease, unspecified: Secondary | ICD-10-CM

## 2017-08-27 MED ORDER — OXYCODONE-ACETAMINOPHEN 10-325 MG PO TABS: 1.0000 | ORAL_TABLET | Freq: Four times a day (QID) | ORAL | 0 refills | Status: DC | PRN

## 2017-08-27 MED ORDER — SULFAMETHOXAZOLE-TRIMETHOPRIM 800-160 MG PO TABS: 1.0000 | ORAL_TABLET | Freq: Two times a day (BID) | ORAL | 0 refills | Status: DC

## 2017-08-27 MED ORDER — AMOXICILLIN-POT CLAVULANATE 875-125 MG PO TABS: 1.0000 | ORAL_TABLET | Freq: Two times a day (BID) | ORAL | 0 refills | Status: DC

## 2017-08-27 NOTE — Telephone Encounter (Signed)
Pt is scheduled and here to see Dr. Jacqualyn Posey for the pain in his right foot.

## 2017-08-27 NOTE — Telephone Encounter (Signed)
I had surgery on my right foot and I'm having severe pain in my right foot and most of the time I cannot even walk on it. Please call me back at [phone].

## 2017-08-28 ENCOUNTER — Telehealth: Payer: Self-pay | Admitting: Podiatry

## 2017-08-28 DIAGNOSIS — N400 Enlarged prostate without lower urinary tract symptoms: Secondary | ICD-10-CM | POA: Diagnosis not present

## 2017-08-28 DIAGNOSIS — J449 Chronic obstructive pulmonary disease, unspecified: Secondary | ICD-10-CM | POA: Diagnosis not present

## 2017-08-28 DIAGNOSIS — D631 Anemia in chronic kidney disease: Secondary | ICD-10-CM | POA: Diagnosis not present

## 2017-08-28 DIAGNOSIS — N183 Chronic kidney disease, stage 3 (moderate): Secondary | ICD-10-CM | POA: Diagnosis not present

## 2017-08-28 DIAGNOSIS — I2510 Atherosclerotic heart disease of native coronary artery without angina pectoris: Secondary | ICD-10-CM | POA: Diagnosis not present

## 2017-08-28 DIAGNOSIS — K219 Gastro-esophageal reflux disease without esophagitis: Secondary | ICD-10-CM | POA: Diagnosis not present

## 2017-08-28 DIAGNOSIS — Z89411 Acquired absence of right great toe: Secondary | ICD-10-CM | POA: Diagnosis not present

## 2017-08-28 DIAGNOSIS — I129 Hypertensive chronic kidney disease with stage 1 through stage 4 chronic kidney disease, or unspecified chronic kidney disease: Secondary | ICD-10-CM | POA: Diagnosis not present

## 2017-08-28 DIAGNOSIS — Z89421 Acquired absence of other right toe(s): Secondary | ICD-10-CM | POA: Diagnosis not present

## 2017-08-28 MED ORDER — IODIXANOL 320 MG/ML IV SOLN
200.0000 mL | Freq: Once | INTRAVENOUS | Status: AC | PRN
  Administered 2017-08-28: 120 mL via INTRA_ARTERIAL

## 2017-08-28 NOTE — Addendum Note (Signed)
Encounter addended by: Riley Churches on: 08/28/2017 4:07 PM  Actions taken: Imaging Exam ended, Order list changed, MAR administration accepted

## 2017-08-28 NOTE — Telephone Encounter (Signed)
I'm calling for my husband. We were out for a little while and when we came back I had a message. I goofed and took it off before I got all of the information. If anyone needs to find out about my husband, just give me a call at [phone]. Thanks. Bye.

## 2017-08-28 NOTE — Progress Notes (Signed)
Subjective: Derrick [name] is a 83 y.o. is seen today in office s/p right TMA with incisional wound VAC preformed on 08/14/2017 with Dr. [name].  He presents today as a walk-in patient due to increasing pain to the surgical foot on the right side.  He states that this started this morning having increasing pain.  He denies any systemic complaints such as fevers, chills, nausea, vomiting.  He states that it hurts he could not foot pain on the surgical foot.  He denies any recent injury or trauma.  He presents in a wheelchair.  He has no other concerns. Denies any systemic complaints such as fevers, chills, nausea, vomiting. No calf pain, chest pain, shortness of breath.   Objective: General: No acute distress, AAOx3  DP/PT pulses palpable 2/4, CRT < 3 sec to all digits.  Protective sensation decreased. Motor function intact to the ankle Large dressing was intact of the surgical site which did appear to be tight as well On the right foot the incision is well coapted on the medial lateral aspect however upon the central aspect there is an opening which probes approximately 2.5 cm.  There was macerated tissue along the incision.  There is no probing to bone.  I was unable to get any pus or any drainage coming out of the wound.  There is no undermining that I could account for.  There is edema to the distal stump and there is erythema to this area but there is no fluctuance or crepitus and there is no ascending cellulitis.  There is no malodor. No other open lesions or pre-ulcerative lesions.  No pain with calf compression, swelling, warmth, erythema.         Assessment and Plan:  Status post right transmetatarsal amputation, presents with increasing pain and there was erythema along the surgical site.  -Treatment options discussed including all alternatives, risks, and complications -The dressing was removed.  The wound was evaluated and there was a new opening with probing.  Given the erythema and the  pain we will start antibiotics.  I prescribed Augmentin, Bactrim for this.  Also Percocet 10/325 as prescribed.  Betadine was painted over the incision followed by dry sterile dressing.  Continue with daily dressing changes and he has a nurse is coming to the house tomorrow to change the bandage.  I discussed with him that there is any increase in redness or if he has any systemic signs of infection he is to go immediately to the emergency room and he and his wife both verbally understood this.  Recommended nonweightbearing and elevation. -Monitor for any clinical signs or symptoms of infection and DVT/PE and directed to call the office immediately should any occur or go to the ER. -Follow-up in 1 week sooner if any problems arise. In the meantime, encouraged to call the office with any questions, concerns, change in symptoms.   Celesta Gentile, DPM

## 2017-08-30 DIAGNOSIS — Z89411 Acquired absence of right great toe: Secondary | ICD-10-CM | POA: Diagnosis not present

## 2017-08-30 DIAGNOSIS — I2510 Atherosclerotic heart disease of native coronary artery without angina pectoris: Secondary | ICD-10-CM | POA: Diagnosis not present

## 2017-08-30 DIAGNOSIS — Z89421 Acquired absence of other right toe(s): Secondary | ICD-10-CM | POA: Diagnosis not present

## 2017-08-30 DIAGNOSIS — I129 Hypertensive chronic kidney disease with stage 1 through stage 4 chronic kidney disease, or unspecified chronic kidney disease: Secondary | ICD-10-CM | POA: Diagnosis not present

## 2017-08-30 DIAGNOSIS — D631 Anemia in chronic kidney disease: Secondary | ICD-10-CM | POA: Diagnosis not present

## 2017-08-30 DIAGNOSIS — N400 Enlarged prostate without lower urinary tract symptoms: Secondary | ICD-10-CM | POA: Diagnosis not present

## 2017-08-30 DIAGNOSIS — N183 Chronic kidney disease, stage 3 (moderate): Secondary | ICD-10-CM | POA: Diagnosis not present

## 2017-08-30 DIAGNOSIS — J449 Chronic obstructive pulmonary disease, unspecified: Secondary | ICD-10-CM | POA: Diagnosis not present

## 2017-08-30 DIAGNOSIS — K219 Gastro-esophageal reflux disease without esophagitis: Secondary | ICD-10-CM | POA: Diagnosis not present

## 2017-09-02 ENCOUNTER — Ambulatory Visit (INDEPENDENT_AMBULATORY_CARE_PROVIDER_SITE_OTHER): Payer: Medicare Other

## 2017-09-02 ENCOUNTER — Ambulatory Visit (INDEPENDENT_AMBULATORY_CARE_PROVIDER_SITE_OTHER): Payer: Medicare Other | Admitting: Podiatry

## 2017-09-02 DIAGNOSIS — G8918 Other acute postprocedural pain: Secondary | ICD-10-CM

## 2017-09-02 DIAGNOSIS — T8130XA Disruption of wound, unspecified, initial encounter: Secondary | ICD-10-CM

## 2017-09-02 MED ORDER — OXYCODONE-ACETAMINOPHEN 10-325 MG PO TABS: 1.0000 | ORAL_TABLET | Freq: Four times a day (QID) | ORAL | 0 refills | Status: DC | PRN

## 2017-09-04 DIAGNOSIS — N400 Enlarged prostate without lower urinary tract symptoms: Secondary | ICD-10-CM | POA: Diagnosis not present

## 2017-09-04 DIAGNOSIS — Z89411 Acquired absence of right great toe: Secondary | ICD-10-CM | POA: Diagnosis not present

## 2017-09-04 DIAGNOSIS — I129 Hypertensive chronic kidney disease with stage 1 through stage 4 chronic kidney disease, or unspecified chronic kidney disease: Secondary | ICD-10-CM | POA: Diagnosis not present

## 2017-09-04 DIAGNOSIS — D631 Anemia in chronic kidney disease: Secondary | ICD-10-CM | POA: Diagnosis not present

## 2017-09-04 DIAGNOSIS — Z89421 Acquired absence of other right toe(s): Secondary | ICD-10-CM | POA: Diagnosis not present

## 2017-09-04 DIAGNOSIS — J449 Chronic obstructive pulmonary disease, unspecified: Secondary | ICD-10-CM | POA: Diagnosis not present

## 2017-09-04 DIAGNOSIS — N183 Chronic kidney disease, stage 3 (moderate): Secondary | ICD-10-CM | POA: Diagnosis not present

## 2017-09-04 DIAGNOSIS — I2510 Atherosclerotic heart disease of native coronary artery without angina pectoris: Secondary | ICD-10-CM | POA: Diagnosis not present

## 2017-09-04 DIAGNOSIS — K219 Gastro-esophageal reflux disease without esophagitis: Secondary | ICD-10-CM | POA: Diagnosis not present

## 2017-09-04 NOTE — Progress Notes (Signed)
Subjective: Mr. Dyal presents the office today with his wife and son for follow-up evaluation status post transmetatarsal amputation on the right side, August 14, 2017 with Dr. [name].  I saw him last appointment due to increased pain.  He has been on antibiotics however he has been having some diarrhea although minimal.  Denies any bloody stools.  He states his pain is somewhat improved but it does continue.  He is asking for refill of pain medication as well.  He is try to be nonweightbearing as much as possible but unable to do so he is still having quite a bit of pain at surgical site. Denies any systemic complaints such as fevers, chills, nausea, vomiting. No acute changes since last appointment, and no other complaints at this time.   Objective: AAO x3, NAD Neurovascular status appears to be unchanged The medial incision appears to be healing well however along the lateral half of his new and sharp present.  On the central aspect of the incision along the area probing it still probes approximately 1.5 cm.  Small amount of clear drainage is expressed but there is no pus.  There is no fluctuation or crepitation is no malodor.  There is decreased edema and erythema to the distal stump however there is still tenderness along this area.  There is no ascending sialitis. No other open lesions or pre-ulcerative lesions.  No pain with calf compression, swelling, warmth, erythema        Assessment: Nonhealing incision site however with decreased cellulitis  Plan: -All treatment options discussed with the patient including all alternatives, risks, complications.  -X-rays were obtained and reviewed.  No evidence of soft tissue emphysema.  No evidence of acute osteomyelitis. -We had a long discussion regards to healing options at this time for surgery.  After review of the chart he has seen Dr. [name] with IR who did an angio.  It appears that his distal vessels were unable to be opened.  Because of  this I am not convinced that doing a more possible surgery on the foot is going to allow for better healing.  I discussed with the family and talk with Dr. [name] in regards to options and possibly vascular surgery consultation.  For now continue Betadine wet to dry dressing changes. -He is to return to the antibiotics that he was on directly after surgery as he was not having issues with these. -Ultimately I think he is in end up with a below-knee amputation however hopefully this can still be salvageable but discussed the chances with the family today. -He is to follow-up with Dr. [name] on Thursday of this week however if there is any changes to go immediately to the emergency room.  I refilled his pain medication today.  Trula Slade DPM  -Patient encouraged to call the office with any questions, concerns, change in symptoms.

## 2017-09-05 ENCOUNTER — Ambulatory Visit (INDEPENDENT_AMBULATORY_CARE_PROVIDER_SITE_OTHER): Payer: Medicare Other | Admitting: Podiatry

## 2017-09-05 ENCOUNTER — Other Ambulatory Visit: Payer: Self-pay | Admitting: Podiatry

## 2017-09-05 ENCOUNTER — Telehealth: Payer: Self-pay | Admitting: Podiatry

## 2017-09-05 DIAGNOSIS — Z89439 Acquired absence of unspecified foot: Secondary | ICD-10-CM

## 2017-09-05 DIAGNOSIS — Z9889 Other specified postprocedural states: Secondary | ICD-10-CM

## 2017-09-05 MED ORDER — HYDROMORPHONE HCL 2 MG PO TABS: 2.0000 mg | ORAL_TABLET | ORAL | 0 refills | Status: DC | PRN

## 2017-09-05 NOTE — Telephone Encounter (Signed)
I'm calling for my husband. He needs another refill of the medication at [facility]. He only has two pills left and I would like to pick it up tomorrow. You can call me back at [phone]. Thanks.

## 2017-09-05 NOTE — Telephone Encounter (Signed)
Pt's wife, [name] is unable to give the name of the medication pt is needing. I informed [name], pt would benefit from having [employer] contact us with the medication pt needed.

## 2017-09-06 NOTE — Progress Notes (Signed)
  Subjective:  Patient ID: Derrick [name], male    DOB: Jul 28, 1932,  MRN: [id number]  Chief Complaint  Patient presents with  . Routine Post Op    wound check   DOS: 08/14/17 Procedure: R TMA  83 y.o. male returns for post-op check. Denies N/V/F/Ch. Pain is controlled with current medications.  In better spirits today.   Objective:   General AA&O x3. Normal mood and affect.  Vascular Foot warm and well perfused.  Neurologic Gross sensation intact.  Dermatologic Amputation stump healing with intact suture material.  Central gapping with deep probing, lateral eschar noted minimal active drainage.  No purulence.  No erythema.  No signs of acute infection     Orthopedic: Tenderness to palpation noted about the surgical site.    Assessment & Plan:  Patient was evaluated and treated and all questions answered.  S/p R TMA -Overall improved relative to last visit.  Still with central dehiscence that he may benefit from debridement and repeat closure.  Discussed this with patient.  Will re-eval at next visit. -No signs of acute infection today.  No need for antibiotic coverage today -We will plan for debridement and possible closure in the near future -Continue with home health care every other day Betadine wet to dry dressing changes -Betadine wet-to-dry dressing applied today -Pain medication refilled  Return in about 6 days (around 09/11/2017) for Post-op.

## 2017-09-09 ENCOUNTER — Emergency Department (HOSPITAL_COMMUNITY): Payer: Medicare Other

## 2017-09-09 ENCOUNTER — Inpatient Hospital Stay (HOSPITAL_COMMUNITY)
Admission: EM | Admit: 2017-09-09 | Discharge: 2017-09-17 | DRG: 857 | Disposition: A | Discharge status: Home Health | Payer: Medicare Other | Attending: Internal Medicine | Admitting: Internal Medicine

## 2017-09-09 ENCOUNTER — Other Ambulatory Visit: Payer: Self-pay

## 2017-09-09 ENCOUNTER — Encounter (HOSPITAL_COMMUNITY): Payer: Self-pay | Admitting: *Deleted

## 2017-09-09 DIAGNOSIS — K219 Gastro-esophageal reflux disease without esophagitis: Secondary | ICD-10-CM | POA: Diagnosis present

## 2017-09-09 DIAGNOSIS — M86671 Other chronic osteomyelitis, right ankle and foot: Secondary | ICD-10-CM | POA: Diagnosis not present

## 2017-09-09 DIAGNOSIS — N179 Acute kidney failure, unspecified: Secondary | ICD-10-CM | POA: Diagnosis not present

## 2017-09-09 DIAGNOSIS — Z8673 Personal history of transient ischemic attack (TIA), and cerebral infarction without residual deficits: Secondary | ICD-10-CM

## 2017-09-09 DIAGNOSIS — Z905 Acquired absence of kidney: Secondary | ICD-10-CM | POA: Diagnosis not present

## 2017-09-09 DIAGNOSIS — R55 Syncope and collapse: Secondary | ICD-10-CM | POA: Diagnosis not present

## 2017-09-09 DIAGNOSIS — M869 Osteomyelitis, unspecified: Secondary | ICD-10-CM

## 2017-09-09 DIAGNOSIS — I959 Hypotension, unspecified: Secondary | ICD-10-CM | POA: Diagnosis not present

## 2017-09-09 DIAGNOSIS — Z85528 Personal history of other malignant neoplasm of kidney: Secondary | ICD-10-CM | POA: Diagnosis not present

## 2017-09-09 DIAGNOSIS — I10 Essential (primary) hypertension: Secondary | ICD-10-CM | POA: Diagnosis present

## 2017-09-09 DIAGNOSIS — M86271 Subacute osteomyelitis, right ankle and foot: Secondary | ICD-10-CM | POA: Diagnosis not present

## 2017-09-09 DIAGNOSIS — E11621 Type 2 diabetes mellitus with foot ulcer: Secondary | ICD-10-CM | POA: Diagnosis not present

## 2017-09-09 DIAGNOSIS — E861 Hypovolemia: Secondary | ICD-10-CM | POA: Diagnosis not present

## 2017-09-09 DIAGNOSIS — J449 Chronic obstructive pulmonary disease, unspecified: Secondary | ICD-10-CM | POA: Diagnosis not present

## 2017-09-09 DIAGNOSIS — N189 Chronic kidney disease, unspecified: Secondary | ICD-10-CM | POA: Diagnosis present

## 2017-09-09 DIAGNOSIS — N183 Chronic kidney disease, stage 3 (moderate): Secondary | ICD-10-CM | POA: Diagnosis not present

## 2017-09-09 DIAGNOSIS — Z8546 Personal history of malignant neoplasm of prostate: Secondary | ICD-10-CM

## 2017-09-09 DIAGNOSIS — E860 Dehydration: Secondary | ICD-10-CM | POA: Diagnosis present

## 2017-09-09 DIAGNOSIS — T8781 Dehiscence of amputation stump: Secondary | ICD-10-CM | POA: Diagnosis present

## 2017-09-09 DIAGNOSIS — E7800 Pure hypercholesterolemia, unspecified: Secondary | ICD-10-CM | POA: Diagnosis not present

## 2017-09-09 DIAGNOSIS — Z823 Family history of stroke: Secondary | ICD-10-CM | POA: Diagnosis not present

## 2017-09-09 DIAGNOSIS — R630 Anorexia: Secondary | ICD-10-CM | POA: Diagnosis not present

## 2017-09-09 DIAGNOSIS — T148XXA Other injury of unspecified body region, initial encounter: Secondary | ICD-10-CM

## 2017-09-09 DIAGNOSIS — F410 Panic disorder [episodic paroxysmal anxiety] without agoraphobia: Secondary | ICD-10-CM | POA: Diagnosis present

## 2017-09-09 DIAGNOSIS — L089 Local infection of the skin and subcutaneous tissue, unspecified: Secondary | ICD-10-CM

## 2017-09-09 DIAGNOSIS — Z7982 Long term (current) use of aspirin: Secondary | ICD-10-CM

## 2017-09-09 DIAGNOSIS — E869 Volume depletion, unspecified: Secondary | ICD-10-CM | POA: Diagnosis present

## 2017-09-09 DIAGNOSIS — Z87891 Personal history of nicotine dependence: Secondary | ICD-10-CM | POA: Diagnosis not present

## 2017-09-09 DIAGNOSIS — R110 Nausea: Secondary | ICD-10-CM

## 2017-09-09 DIAGNOSIS — I2510 Atherosclerotic heart disease of native coronary artery without angina pectoris: Secondary | ICD-10-CM | POA: Diagnosis present

## 2017-09-09 DIAGNOSIS — I739 Peripheral vascular disease, unspecified: Secondary | ICD-10-CM | POA: Diagnosis present

## 2017-09-09 DIAGNOSIS — Y835 Amputation of limb(s) as the cause of abnormal reaction of the patient, or of later complication, without mention of misadventure at the time of the procedure: Secondary | ICD-10-CM | POA: Diagnosis present

## 2017-09-09 DIAGNOSIS — E871 Hypo-osmolality and hyponatremia: Secondary | ICD-10-CM | POA: Diagnosis not present

## 2017-09-09 DIAGNOSIS — R0902 Hypoxemia: Secondary | ICD-10-CM | POA: Diagnosis not present

## 2017-09-09 DIAGNOSIS — M069 Rheumatoid arthritis, unspecified: Secondary | ICD-10-CM | POA: Diagnosis present

## 2017-09-09 DIAGNOSIS — I129 Hypertensive chronic kidney disease with stage 1 through stage 4 chronic kidney disease, or unspecified chronic kidney disease: Secondary | ICD-10-CM | POA: Diagnosis not present

## 2017-09-09 DIAGNOSIS — Z7902 Long term (current) use of antithrombotics/antiplatelets: Secondary | ICD-10-CM

## 2017-09-09 DIAGNOSIS — T8149XA Infection following a procedure, other surgical site, initial encounter: Secondary | ICD-10-CM | POA: Diagnosis not present

## 2017-09-09 DIAGNOSIS — Z7952 Long term (current) use of systemic steroids: Secondary | ICD-10-CM | POA: Diagnosis not present

## 2017-09-09 DIAGNOSIS — R5381 Other malaise: Secondary | ICD-10-CM | POA: Diagnosis not present

## 2017-09-09 DIAGNOSIS — Z89431 Acquired absence of right foot: Secondary | ICD-10-CM

## 2017-09-09 DIAGNOSIS — Z79899 Other long term (current) drug therapy: Secondary | ICD-10-CM

## 2017-09-09 DIAGNOSIS — E876 Hypokalemia: Secondary | ICD-10-CM | POA: Diagnosis not present

## 2017-09-09 DIAGNOSIS — M86679 Other chronic osteomyelitis, unspecified ankle and foot: Secondary | ICD-10-CM | POA: Diagnosis not present

## 2017-09-09 DIAGNOSIS — R231 Pallor: Secondary | ICD-10-CM | POA: Diagnosis not present

## 2017-09-09 DIAGNOSIS — Z8249 Family history of ischemic heart disease and other diseases of the circulatory system: Secondary | ICD-10-CM

## 2017-09-09 DIAGNOSIS — D649 Anemia, unspecified: Secondary | ICD-10-CM | POA: Diagnosis present

## 2017-09-09 DIAGNOSIS — I9589 Other hypotension: Secondary | ICD-10-CM

## 2017-09-09 DIAGNOSIS — R918 Other nonspecific abnormal finding of lung field: Secondary | ICD-10-CM | POA: Diagnosis not present

## 2017-09-09 DIAGNOSIS — R1110 Vomiting, unspecified: Secondary | ICD-10-CM | POA: Diagnosis not present

## 2017-09-09 DIAGNOSIS — Z9889 Other specified postprocedural states: Secondary | ICD-10-CM

## 2017-09-09 LAB — CBC WITH DIFFERENTIAL/PLATELET
BASOS ABS: 0 10*3/uL (ref 0.0–0.1)
BASOS PCT: 0 %
EOS ABS: 0.4 10*3/uL (ref 0.0–0.7)
Eosinophils Relative: 4 %
HEMATOCRIT: 32.2 % — AB (ref 39.0–52.0)
HEMOGLOBIN: 10.9 g/dL — AB (ref 13.0–17.0)
Lymphocytes Relative: 24 %
Lymphs Abs: 2.7 10*3/uL (ref 0.7–4.0)
MCH: 28.8 pg (ref 26.0–34.0)
MCHC: 33.9 g/dL (ref 30.0–36.0)
MCV: 85.2 fL (ref 78.0–100.0)
MONOS PCT: 9 %
Monocytes Absolute: 1 10*3/uL (ref 0.1–1.0)
NEUTROS ABS: 7.2 10*3/uL (ref 1.7–7.7)
NEUTROS PCT: 63 %
Platelets: 337 10*3/uL (ref 150–400)
RBC: 3.78 MIL/uL — AB (ref 4.22–5.81)
RDW: 15.4 % (ref 11.5–15.5)
WBC: 11.3 10*3/uL — AB (ref 4.0–10.5)

## 2017-09-09 LAB — URINALYSIS, ROUTINE W REFLEX MICROSCOPIC
Bilirubin Urine: NEGATIVE
GLUCOSE, UA: NEGATIVE mg/dL
Hgb urine dipstick: NEGATIVE
KETONES UR: NEGATIVE mg/dL
LEUKOCYTES UA: NEGATIVE
Nitrite: NEGATIVE
PROTEIN: NEGATIVE mg/dL
Specific Gravity, Urine: 1.009 (ref 1.005–1.030)
pH: 6 (ref 5.0–8.0)

## 2017-09-09 LAB — COMPREHENSIVE METABOLIC PANEL
ALBUMIN: 3.5 g/dL (ref 3.5–5.0)
ALT: 21 U/L (ref 17–63)
AST: 23 U/L (ref 15–41)
Alkaline Phosphatase: 51 U/L (ref 38–126)
Anion gap: 11 (ref 5–15)
BUN: 33 mg/dL — AB (ref 6–20)
CHLORIDE: 98 mmol/L — AB (ref 101–111)
CO2: 19 mmol/L — AB (ref 22–32)
CREATININE: 2.69 mg/dL — AB (ref 0.61–1.24)
Calcium: 9.4 mg/dL (ref 8.9–10.3)
GFR calc Af Amer: 24 mL/min — ABNORMAL LOW (ref >60)
GFR, EST NON AFRICAN AMERICAN: 21 mL/min — AB (ref >60)
GLUCOSE: 154 mg/dL — AB (ref 65–99)
Potassium: 3.4 mmol/L — ABNORMAL LOW (ref 3.5–5.1)
Sodium: 128 mmol/L — ABNORMAL LOW (ref 135–145)
Total Bilirubin: 0.8 mg/dL (ref 0.3–1.2)
Total Protein: 6.4 g/dL — ABNORMAL LOW (ref 6.5–8.1)

## 2017-09-09 LAB — I-STAT TROPONIN, ED: TROPONIN I, POC: 0.01 ng/mL (ref 0.00–0.08)

## 2017-09-09 LAB — I-STAT CG4 LACTIC ACID, ED
LACTIC ACID, VENOUS: 2.38 mmol/L — AB (ref 0.5–1.9)
LACTIC ACID, VENOUS: 1.65 mmol/L (ref 0.5–1.9)

## 2017-09-09 LAB — PROTIME-INR
INR: 1.1
PROTHROMBIN TIME: 14.1 s (ref 11.4–15.2)

## 2017-09-09 MED ORDER — CLOPIDOGREL BISULFATE 75 MG PO TABS
75.0000 mg | ORAL_TABLET | Freq: Every day | ORAL | Status: DC
  Administered 2017-09-09 – 2017-09-17 (×9): 75 mg via ORAL
  Filled 2017-09-09 (×9): qty 1

## 2017-09-09 MED ORDER — SODIUM CHLORIDE 0.9 % IV BOLUS (SEPSIS)
1000.0000 mL | Freq: Once | INTRAVENOUS | Status: AC
  Administered 2017-09-09: 1000 mL via INTRAVENOUS

## 2017-09-09 MED ORDER — ENOXAPARIN SODIUM 40 MG/0.4ML ~~LOC~~ SOLN: 40.0000 mg | SUBCUTANEOUS | Status: DC

## 2017-09-09 MED ORDER — ENOXAPARIN SODIUM 30 MG/0.3ML ~~LOC~~ SOLN
30.0000 mg | SUBCUTANEOUS | Status: DC
  Administered 2017-09-09 – 2017-09-10 (×2): 30 mg via SUBCUTANEOUS
  Filled 2017-09-09 (×2): qty 0.3

## 2017-09-09 MED ORDER — PIPERACILLIN-TAZOBACTAM IN DEX 2-0.25 GM/50ML IV SOLN
2.2500 g | Freq: Three times a day (TID) | INTRAVENOUS | Status: DC
  Administered 2017-09-09 – 2017-09-11 (×5): 2.25 g via INTRAVENOUS
  Filled 2017-09-09 (×8): qty 50

## 2017-09-09 MED ORDER — VANCOMYCIN HCL IN DEXTROSE 750-5 MG/150ML-% IV SOLN: 750.0000 mg | INTRAVENOUS | Status: DC

## 2017-09-09 MED ORDER — SODIUM CHLORIDE 0.9 % IV SOLN
INTRAVENOUS | Status: DC
  Administered 2017-09-09 – 2017-09-13 (×3): via INTRAVENOUS

## 2017-09-09 MED ORDER — VITAMIN B-12 1000 MCG PO TABS
1000.0000 ug | ORAL_TABLET | Freq: Every day | ORAL | Status: DC
  Administered 2017-09-10 – 2017-09-17 (×8): 1000 ug via ORAL
  Filled 2017-09-09 (×8): qty 1

## 2017-09-09 MED ORDER — PREDNISONE 5 MG PO TABS
5.0000 mg | ORAL_TABLET | Freq: Every day | ORAL | Status: DC
Start: 2017-09-10 — End: 2017-09-17
  Administered 2017-09-10 – 2017-09-17 (×8): 5 mg via ORAL
  Filled 2017-09-09 (×10): qty 1

## 2017-09-09 MED ORDER — ASPIRIN EC 81 MG PO TBEC
81.0000 mg | DELAYED_RELEASE_TABLET | Freq: Every day | ORAL | Status: DC
  Administered 2017-09-09 – 2017-09-16 (×8): 81 mg via ORAL
  Filled 2017-09-09 (×8): qty 1

## 2017-09-09 MED ORDER — ACETAMINOPHEN 650 MG RE SUPP: 650.0000 mg | Freq: Four times a day (QID) | RECTAL | Status: DC | PRN

## 2017-09-09 MED ORDER — OXYCODONE-ACETAMINOPHEN 5-325 MG PO TABS
1.0000 | ORAL_TABLET | ORAL | Status: DC | PRN
  Administered 2017-09-09: 2 via ORAL
  Administered 2017-09-10: 1 via ORAL
  Administered 2017-09-11 – 2017-09-13 (×9): 2 via ORAL
  Administered 2017-09-14 – 2017-09-16 (×5): 1 via ORAL
  Filled 2017-09-09 (×3): qty 1
  Filled 2017-09-09: qty 2
  Filled 2017-09-09: qty 1
  Filled 2017-09-09 (×3): qty 2
  Filled 2017-09-09 (×5): qty 1
  Filled 2017-09-09 (×2): qty 2
  Filled 2017-09-09: qty 1
  Filled 2017-09-09 (×4): qty 2

## 2017-09-09 MED ORDER — ONDANSETRON HCL 4 MG PO TABS
4.0000 mg | ORAL_TABLET | Freq: Four times a day (QID) | ORAL | Status: DC | PRN
  Administered 2017-09-10: 4 mg via ORAL
  Filled 2017-09-09: qty 1

## 2017-09-09 MED ORDER — HYDROMORPHONE HCL 1 MG/ML IJ SOLN
1.0000 mg | INTRAMUSCULAR | Status: DC | PRN
  Filled 2017-09-09: qty 1

## 2017-09-09 MED ORDER — ACETAMINOPHEN 325 MG PO TABS
650.0000 mg | ORAL_TABLET | Freq: Four times a day (QID) | ORAL | Status: DC | PRN
  Administered 2017-09-14 (×2): 650 mg via ORAL
  Filled 2017-09-09 (×2): qty 2

## 2017-09-09 MED ORDER — ADULT MULTIVITAMIN W/MINERALS CH
1.0000 | ORAL_TABLET | Freq: Every day | ORAL | Status: DC
  Administered 2017-09-09 – 2017-09-17 (×9): 1 via ORAL
  Filled 2017-09-09 (×9): qty 1

## 2017-09-09 MED ORDER — PANTOPRAZOLE SODIUM 40 MG PO TBEC
40.0000 mg | DELAYED_RELEASE_TABLET | Freq: Two times a day (BID) | ORAL | Status: DC
  Administered 2017-09-09 – 2017-09-17 (×16): 40 mg via ORAL
  Filled 2017-09-09 (×16): qty 1

## 2017-09-09 MED ORDER — ONDANSETRON HCL 4 MG/2ML IJ SOLN: 4.0000 mg | Freq: Four times a day (QID) | INTRAMUSCULAR | Status: DC | PRN

## 2017-09-09 MED ORDER — FINASTERIDE 5 MG PO TABS
5.0000 mg | ORAL_TABLET | Freq: Every day | ORAL | Status: DC
  Administered 2017-09-10 – 2017-09-17 (×8): 5 mg via ORAL
  Filled 2017-09-09 (×8): qty 1

## 2017-09-09 MED ORDER — VANCOMYCIN HCL IN DEXTROSE 750-5 MG/150ML-% IV SOLN
750.0000 mg | Freq: Once | INTRAVENOUS | Status: AC
  Administered 2017-09-09: 750 mg via INTRAVENOUS
  Filled 2017-09-09: qty 150

## 2017-09-09 MED ORDER — AMITRIPTYLINE HCL 25 MG PO TABS
25.0000 mg | ORAL_TABLET | Freq: Every day | ORAL | Status: DC
  Administered 2017-09-09 – 2017-09-17 (×9): 25 mg via ORAL
  Filled 2017-09-09 (×9): qty 1

## 2017-09-09 MED ORDER — HYDROXYCHLOROQUINE SULFATE 200 MG PO TABS
200.0000 mg | ORAL_TABLET | Freq: Every day | ORAL | Status: DC
  Administered 2017-09-10 – 2017-09-17 (×8): 200 mg via ORAL
  Filled 2017-09-09 (×8): qty 1

## 2017-09-09 NOTE — ED Notes (Signed)
Pt bladder scanned. Pt's urine result was zero. Unable to collect urine at this time.

## 2017-09-09 NOTE — Progress Notes (Signed)
Pharmacy Antibiotic Note  Derrick [name] is a 83 y.o. male admitted on 09/09/2017 with syncopal episode, sent from PCP office. Starting empiric abx for sepsis. Pt had recent partial amputation of R foot. LA 2.38. He also has known CKD but admitted with bump in Scr today. Normal Scr ~1.3, currently 2.69, eCrCl 20 ml/min.  There was some confusion with his prior to admission medications. Pt had been taking old antibiotics. He was not intended to be on any antibiotics prior to admission relating to his foot amputation.    Plan: -Vancomycin 1250 mg IV x1 then 750 mg IV q48h -Zosyn 2.25 g IV q8h -Monitor renal fx, cultures, VR as needed   Height: 6' (182.9 cm) Weight: 145 lb (65.8 kg) IBW/kg (Calculated) : 77.6  Temp (24hrs), Avg:98.4 F (36.9 C), Min:98.4 F (36.9 C), Max:98.4 F (36.9 C)  Recent Labs  Lab 09/09/17 1503 09/09/17 1508  WBC 11.3*  --   CREATININE 2.69*  --   LATICACIDVEN  --  2.38*    Estimated Creatinine Clearance: 19.7 mL/min (A) (by C-G formula based on SCr of 2.69 mg/dL (H)).    Allergies  Allergen Reactions  . Ace Inhibitors Other (See Comments)    Reaction:  Unknown     Antimicrobials this admission: 12/3 vancomycin > 12/3 zosyn >  Dose adjustments this admission: N/A  Microbiology results: 12/3 blood cx: 12/3 urine cx:  Harvel Quale 09/09/2017 4:30 PM

## 2017-09-09 NOTE — H&P (Signed)
[facility] History and Physical  NAI DASCH OXB:[id number] DOB: 10-Nov-1931 DOA: 09/09/2017  Referring physician: Dr Marcha [name], ED MD  PCP: [name], MD   Chief Complaint: Syncope, pain R foot after TMA  HPI: Derrick [name] is a 83 y.o. male with hx of COPD, mild CKD, HL, HTN, prostate cancer, hx RCCa sp nephrectomy presented to ED today w/ recurrent syncope.  In eD BP was in the 70's, BP's imrpoved after NS bolus to 90's - 100's and pt feeling better.  Labs show ^B/Cr. Asked to see for syncope and AKI.    In August pt had paronychia of the R great toe which was complicated by infection then gangrene.  He underwent angiogram right leg Aug 23rd by IR Dr [name] w/ balloon PTA to the ant tibial artery.  R PT artery couldn't be crossed d/t calcification. He got worse and underwent R 1/2 toe amps on 06/13/17 by Dr Jerilynn Mages. Price.  He then had another IR procedure by Dr [name] on Nov 1st with PTA to prox AT and PT. They were unable to treat the distal tibial vessels/pedal vessels due to "dense coral-reef calcification".  The foot progressed and he was admitted and underwent R TMA on 11/7/ 18, again by Dr [name].    He has had persistent pain in the R foot / TMA stump and was taking percocet but said it  wasn't working so was changed to po dilaudid.  He was started recently on oral abx, cipro and clindamycin and has been taking these.    He has had a lot of "dry heaves" and sig nausea.  Not eating or drinkiing much.  He has passed out several times in the last few days at home.  No CP or SOB, no abd pain, no fevers or chills.  .    ROS  denies CP  no joint pain   no HA  no blurry vision  no rash  no dysuria  no difficulty voiding  no change in urine color    Past Medical History  Past Medical History:  Diagnosis Date  . Anemia   . Anxiety   . Arthritis, rheumatoid ([place])   . BPH (benign prostatic hyperplasia)   . CAD (coronary artery disease)   . CKD (chronic kidney disease)   . COPD  (chronic obstructive pulmonary disease) ([place])   . Depression   . Diverticulosis   . Elevated PSA   . Gangrene ([place])    of toe right foot  . GERD (gastroesophageal reflux disease)   . Herpes simplex labialis   . Hiatal hernia   . High cholesterol   . History of colonic polyps   . Hypertension   . Insomnia   . Lacunar infarction   . Memory loss   . Obesity   . Panic attacks   . Prostate cancer ([place])   . Renal cell cancer ([place])    s/p nephrectomy in 2000  . Seropositive rheumatoid arthritis ([place])   . Spermatocele   . Tinnitus   . Tremor of both hands    Past Surgical History  Past Surgical History:  Procedure Laterality Date  . AMPUTATION TOE Right 06/13/2017   Procedure: AMPUTATION TOE 1st and 2nd;  Surgeon: [name], DPM;  Location: [facility];  Service: Podiatry;  Laterality: Right;  . AMPUTATION TOE    . BACK SURGERY    . COLONOSCOPY    . IR ANGIOGRAM EXTREMITY RIGHT  05/30/2017  . IR ANGIOGRAM  EXTREMITY RIGHT  08/08/2017  . IR ANGIOGRAM SELECTIVE EACH ADDITIONAL VESSEL  08/08/2017  . IR ANGIOGRAM SELECTIVE EACH ADDITIONAL VESSEL  08/08/2017  . IR RADIOLOGIST EVAL & MGMT  05/16/2017  . IR TIB-PERO ART PTA MOD SED  05/30/2017  . IR TIB-PERO ART PTA MOD SED  08/08/2017  . IR TIB-PERO ART UNI PTA EA ADD VESSEL MOD SED  05/30/2017  . IR TIB-PERO ART UNI PTA EA ADD VESSEL MOD SED  08/08/2017  . IR US GUIDE VASC ACCESS RIGHT  05/30/2017  . IR US GUIDE VASC ACCESS RIGHT  08/08/2017  . LEFT HEART CATHETERIZATION WITH CORONARY ANGIOGRAM N/A 11/19/2011   Procedure: LEFT HEART CATHETERIZATION WITH CORONARY ANGIOGRAM;  Surgeon: [name], MD;  Location: [facility] CATH LAB;  Service: Cardiovascular;  Laterality: N/A;  . NEPHRECTOMY  10/1998  . TRANSMETATARSAL AMPUTATION Right 08/14/2017   Procedure: TRANSMETATARSAL AMPUTATION;  Surgeon: [name], DPM;  Location: [facility];  Service: Podiatry;  Laterality: Right;   Family History  Family History  Problem Relation Age of Onset  . Heart  attack Father 90  . Stroke Father   . Kidney failure Sister        Bright's disease   . Cancer Paternal Aunt        d. 56s; NOS cancer of leg  . Kidney disease Sister        d. 49; "Bright's disease" - possible kidney cancer?  . Cancer Sister        NOS cancer; d. 79s; "sick in the hospital"  . Heart attack Son 46  . Leukemia Son 60  . COPD Son   . Breast cancer Daughter 7   Social History  reports that he quit smoking about 38 years ago. His smoking use included cigarettes. He has a 29.00 pack-year smoking history. he has never used smokeless tobacco. He reports that he does not drink alcohol or use drugs. Allergies  Allergies  Allergen Reactions  . Ace Inhibitors Other (See Comments)    Reaction:  Unknown    Home medications Prior to Admission medications   Medication Sig Start Date End Date Taking? Authorizing Provider  amitriptyline (ELAVIL) 25 MG tablet Take 25 mg by mouth daily.   Yes [provider]  amLODipine (NORVASC) 10 MG tablet Take 10 mg by mouth daily.   Yes [provider]  aspirin EC 81 MG tablet Take 81 mg by mouth at bedtime.    Yes [provider]  ciprofloxacin (CIPRO) 250 MG tablet TAKE 1 TABLET BY MOUTH TWICE DAILY Patient taking differently: TAKE 1 TABLET (250 MG) BY MOUTH TWICE DAILY 09/06/17  Yes Trula Slade, DPM  clindamycin (CLEOCIN) 300 MG capsule Take 300 mg by mouth 3 (three) times daily.   Yes [provider]  clopidogrel (PLAVIX) 75 MG tablet Take 75 mg by mouth daily. 08/10/17  Yes [provider]  finasteride (PROSCAR) 5 MG tablet Take 5 mg by mouth daily.   Yes [provider]  HYDROmorphone (DILAUDID) 2 MG tablet Take 1 tablet (2 mg total) by mouth every 4 (four) hours as needed for severe pain. 09/05/17  Yes [name], DPM  hydroxychloroquine (PLAQUENIL) 200 MG tablet Take 200 mg by mouth daily.   Yes [provider]  losartan-hydrochlorothiazide (HYZAAR) 100-12.5 MG  tablet Take 1 tablet by mouth daily. 03/05/15  Yes [provider]  Multiple Vitamin (MULTIVITAMIN WITH MINERALS) TABS tablet Take 1 tablet by mouth daily.   Yes [provider]  Omega-3 Fatty Acids (FISH OIL) 1200 MG CAPS Take 2,400 mg by mouth daily.    Yes [provider]  pantoprazole (PROTONIX) 40 MG tablet Take 40 mg by mouth 2 (two) times daily.   Yes [provider]  predniSONE (DELTASONE) 5 MG tablet Take 5 mg by mouth daily with breakfast.    Yes [provider]  vitamin B-12 (CYANOCOBALAMIN) 1000 MCG tablet Take 1,000 mcg by mouth daily.   Yes [provider]  ondansetron (ZOFRAN) 4 MG tablet Take 4 mg by mouth every 8 (eight) hours as needed for nausea or vomiting.    [provider]  oxyCODONE-acetaminophen (PERCOCET) 10-325 MG tablet Take 1 tablet by mouth every 6 (six) hours as needed for pain. MAXIMUM TOTAL ACETAMINOPHEN DOSE IS 4000 MG PER DAY Patient not taking: Reported on 09/09/2017 08/27/17   Trula Slade, DPM  oxyCODONE-acetaminophen (PERCOCET) 10-325 MG tablet Take 1 tablet by mouth every 6 (six) hours as needed for pain. MAXIMUM TOTAL ACETAMINOPHEN DOSE IS 4000 MG PER DAY Patient not taking: Reported on 09/09/2017 09/02/17   Trula Slade, DPM   Liver Function Tests Recent Labs  Lab 09/09/17 1503  AST 23  ALT 21  ALKPHOS 51  BILITOT 0.8  PROT 6.4*  ALBUMIN 3.5   No results for input(s): LIPASE, AMYLASE in the last 168 hours. CBC Recent Labs  Lab 09/09/17 1503  WBC 11.3*  NEUTROABS 7.2  HGB 10.9*  HCT 32.2*  MCV 85.2  PLT 102   Basic Metabolic Panel Recent Labs  Lab 09/09/17 1503  NA 128*  K 3.4*  CL 98*  CO2 19*  GLUCOSE 154*  BUN 33*  CREATININE 2.69*  CALCIUM 9.4     Vitals:   09/09/17 1800 09/09/17 1830 09/09/17 1900 09/09/17 1925  BP: 98/68 92/65 (!) 91/59   Pulse: 77 72 80   Resp: (!) '22 18 11   ' Temp:    98 F (36.7 C)  TempSrc:    Oral  SpO2: 98% 95% 96%    Weight:      Height:       Exam: Gen alert, nad No rash, cyanosis or gangrene Sclera anicteric, throat clear and dry  No jvd or bruits  Chest clear bilat RRR no MRG Abd soft ntnd no mass or ascites +bs GU normal male MS no joint effusions or deformity Ext no LE edema, R foot TMA wound is open in two places draining pus , no red streaking or edema or gangrene Neuro is alert, Ox 3 , nf   Na 128  K 3.4  BUN 33 Cr 2.69  Alb 3.5  LFT's ok  eGFR 21   Lact acid 2.3 > 1.6 after IVF WBC 11k  Hb 10.9  plt 337 UA negative  Assessment: 1. Syncope/ hypotension - due to hypovolemia, nausea/ dry heaves related to #2 2. R TMA wound infection - will send drainage for culture, start IV abx, failed po abx. Consult pt's surgeon in the am 3. PAD hx of RLE perc revasc x 2 per IR in last 3 mos 4. Hx CVA 5. Hx prostate cancer/ RCCa 6. CKD stage 3 7. AKI due to vol depletion / ARB/ hypotension 8. HTN - will hold BP meds for now   Plan - as above       Sol Blazing [facility] Pager [phone]   If 7PM-7AM, please contact night-coverage [email or web address] Password [facility] 09/09/2017, 7:49 PM

## 2017-09-09 NOTE — ED Notes (Addendum)
This RN was sitting at desk when patient noted to be limping up the hall.  Patient states he was looking for a pair of pants.  This RN walked with patient back to his room.  Patient seems slightly confused.  Recent dosing of percocet given.  Patient states he remembers having an episode or two like this at home.  Patient reminded that his family took his belongings home.  This RN noted patient had also pulled his IV out and pulled all cardiac leads off.  Patient reoriented.  When patient thought clearly, AxO x 4.  Patient still concerned about pants.  Also wearing gown wrong, but did not want this RN to assist him in turning it around.  New IV started, fluids restarted, vitals reassessed, and patient reoriented to room location of call bell and reasons to call for this RN.

## 2017-09-09 NOTE — ED Notes (Signed)
Pt incontinent of urine in lobby, bladder scan showed 77ml in bladder.  Family aware urine sample is needed.

## 2017-09-09 NOTE — ED Provider Notes (Signed)
[facility] EMERGENCY DEPARTMENT Provider Note   CSN: [id number] Arrival date & time: 09/09/17  1411     History   Chief Complaint Chief Complaint  Patient presents with  . Loss of Consciousness  . Shortness of Breath    HPI Derrick [name] is a 83 y.o. male.  HPI  Patient presenting with complaint of generalized weakness and multiple episodes of syncope.  Family states that over the last week and a half he has become more weak with some shortness of breath and decreased appetite.  He has had some nausea but no vomiting or diarrhea.  No fevers.  Approximately 1 month ago he had partial foot amputation for gangrene of toe.  He has been on oral antibiotics and wound has been improving per podiatry notes and family.  This morning he was seen at primary doctor's office and had low oxygen saturation.  He improved on oxygen and is not requiring oxygen in the ED.  He has not had any cough but has been short of breath intermittently over the past week at home.  No abdominal pain and no chest pain.There are no other associated systemic symptoms, there are no other alleviating or modifying factors.   Past Medical History:  Diagnosis Date  . Anemia   . Anxiety   . Arthritis, rheumatoid ([place])   . BPH (benign prostatic hyperplasia)   . CAD (coronary artery disease)   . CKD (chronic kidney disease)   . COPD (chronic obstructive pulmonary disease) ([place])   . Depression   . Diverticulosis   . Elevated PSA   . Gangrene ([place])    of toe right foot  . GERD (gastroesophageal reflux disease)   . Herpes simplex labialis   . Hiatal hernia   . High cholesterol   . History of colonic polyps   . Hypertension   . Insomnia   . Lacunar infarction   . Memory loss   . Obesity   . Panic attacks   . Prostate cancer ([place])   . Renal cell cancer ([place])    s/p nephrectomy in 2000  . Seropositive rheumatoid arthritis ([place])   . Spermatocele   . Tinnitus   . Tremor of both hands      Patient Active Problem List   Diagnosis Date Noted  . Malaise 09/09/2017  . Anorexia 09/09/2017  . Hypotension 09/09/2017  . Volume depletion 09/09/2017  . AKI (acute kidney injury) ([place]) 09/09/2017  . Syncope 09/09/2017  . Pain of right great toe 06/12/2017  . Hyponatremia 06/12/2017  . COPD (chronic obstructive pulmonary disease) ([place])   . Lacunar infarction   . Gangrene of toe of right foot ([place])   . Pre-syncope 11/21/2016  . Dyspnea on exertion 03/03/2016  . H/O unilateral nephrectomy 03/03/2016  . Arrhythmia 11/09/2015  . CAD (coronary artery disease) 11/09/2015  . Abnormal ECG 10/22/2013  . Shortness of breath 10/22/2013  . Chest pain at rest 11/18/2011  . Unstable angina ([place]) 11/18/2011  . Thrombocytopenia ([place]) 11/18/2011  . Anemia 11/18/2011  . CKD (chronic kidney disease), stage III ([place]) 11/18/2011  . Hypertension   . High cholesterol   . GERD (gastroesophageal reflux disease)   . BPH (benign prostatic hyperplasia)     Past Surgical History:  Procedure Laterality Date  . AMPUTATION TOE Right 06/13/2017   Procedure: AMPUTATION TOE 1st and 2nd;  Surgeon: [name], DPM;  Location: [facility];  Service: Podiatry;  Laterality: Right;  . AMPUTATION TOE    .  BACK SURGERY    . COLONOSCOPY    . IR ANGIOGRAM EXTREMITY RIGHT  05/30/2017  . IR ANGIOGRAM EXTREMITY RIGHT  08/08/2017  . IR ANGIOGRAM SELECTIVE EACH ADDITIONAL VESSEL  08/08/2017  . IR ANGIOGRAM SELECTIVE EACH ADDITIONAL VESSEL  08/08/2017  . IR RADIOLOGIST EVAL & MGMT  05/16/2017  . IR TIB-PERO ART PTA MOD SED  05/30/2017  . IR TIB-PERO ART PTA MOD SED  08/08/2017  . IR TIB-PERO ART UNI PTA EA ADD VESSEL MOD SED  05/30/2017  . IR TIB-PERO ART UNI PTA EA ADD VESSEL MOD SED  08/08/2017  . IR US GUIDE VASC ACCESS RIGHT  05/30/2017  . IR US GUIDE VASC ACCESS RIGHT  08/08/2017  . LEFT HEART CATHETERIZATION WITH CORONARY ANGIOGRAM N/A 11/19/2011   Procedure: LEFT HEART CATHETERIZATION WITH CORONARY ANGIOGRAM;   Surgeon: [name], MD;  Location[facility];  Service: Cardiovascular;  Laterality: N/A;  . NEPHRECTOMY  10/1998  . TRANSMETATARSAL AMPUTATION Right 08/14/2017   Procedure: TRANSMETATARSAL AMPUTATION;  Surgeon: [name], DPM;  Location: [facility];  Service: Podiatry;  Laterality: Right;       Home Medications    Prior to Admission medications   Medication Sig Start Date End Date Taking? Authorizing Provider  amitriptyline (ELAVIL) 25 MG tablet Take 25 mg by mouth daily.   Yes [provider]  amLODipine (NORVASC) 10 MG tablet Take 10 mg by mouth daily.   Yes [provider]  aspirin EC 81 MG tablet Take 81 mg by mouth at bedtime.    Yes [provider]  ciprofloxacin (CIPRO) 250 MG tablet TAKE 1 TABLET BY MOUTH TWICE DAILY Patient taking differently: TAKE 1 TABLET (250 MG) BY MOUTH TWICE DAILY 09/06/17  Yes Trula Slade, DPM  clindamycin (CLEOCIN) 300 MG capsule Take 300 mg by mouth 3 (three) times daily.   Yes [provider]  clopidogrel (PLAVIX) 75 MG tablet Take 75 mg by mouth daily. 08/10/17  Yes [provider]  finasteride (PROSCAR) 5 MG tablet Take 5 mg by mouth daily.   Yes [provider]  HYDROmorphone (DILAUDID) 2 MG tablet Take 1 tablet (2 mg total) by mouth every 4 (four) hours as needed for severe pain. 09/05/17  Yes [name], DPM  hydroxychloroquine (PLAQUENIL) 200 MG tablet Take 200 mg by mouth daily.   Yes [provider]  losartan-hydrochlorothiazide (HYZAAR) 100-12.5 MG tablet Take 1 tablet by mouth daily. 03/05/15  Yes [provider]  Multiple Vitamin (MULTIVITAMIN WITH MINERALS) TABS tablet Take 1 tablet by mouth daily.   Yes [provider]  Omega-3 Fatty Acids (FISH OIL) 1200 MG CAPS Take 2,400 mg by mouth daily.    Yes [provider]  pantoprazole (PROTONIX) 40 MG tablet Take 40 mg by mouth 2 (two) times daily.   Yes [provider]   predniSONE (DELTASONE) 5 MG tablet Take 5 mg by mouth daily with breakfast.    Yes [provider]  vitamin B-12 (CYANOCOBALAMIN) 1000 MCG tablet Take 1,000 mcg by mouth daily.   Yes [provider]  ondansetron (ZOFRAN) 4 MG tablet Take 4 mg by mouth every 8 (eight) hours as needed for nausea or vomiting.    [provider]  oxyCODONE-acetaminophen (PERCOCET) 10-325 MG tablet Take 1 tablet by mouth every 6 (six) hours as needed for pain. MAXIMUM TOTAL ACETAMINOPHEN DOSE IS 4000 MG PER DAY Patient not taking: Reported on 09/09/2017 08/27/17   Trula Slade, DPM  oxyCODONE-acetaminophen (PERCOCET) 10-325 MG tablet Take 1 tablet by mouth every 6 (six) hours as needed for pain. MAXIMUM TOTAL ACETAMINOPHEN DOSE IS 4000 MG PER DAY Patient not taking: Reported on 09/09/2017 09/02/17   Trula Slade, DPM    Family History Family History  Problem Relation Age of Onset  . Heart attack Father 68  . Stroke Father   . Kidney failure Sister        Bright's disease   . Cancer Paternal Aunt        d. 39s; NOS cancer of leg  . Kidney disease Sister        d. 55; "Bright's disease" - possible kidney cancer?  . Cancer Sister        NOS cancer; d. 18s; "sick in the hospital"  . Heart attack Son 1  . Leukemia Son 67  . COPD Son   . Breast cancer Daughter 54    Social History Social History   Tobacco Use  . Smoking status: Former Smoker    Packs/day: 1.00    Years: 29.00    Pack years: 29.00    Types: Cigarettes    Last attempt to quit: 10/08/1978    Years since quitting: 38.9  . Smokeless tobacco: Never Used  . Tobacco comment: tried smokeless tobacco once but made him sick  Substance Use Topics  . Alcohol use: No    Comment: drank very little in his life - only socially   . Drug use: No     Allergies   Ace inhibitors   Review of Systems Review of Systems  ROS reviewed and all otherwise negative except for mentioned in HPI   Physical  Exam Updated Vital Signs BP 98/68   Pulse 77   Temp 98.4 F (36.9 C) (Oral)   Resp (!) 22   Ht 6' (1.829 m)   Wt 65.8 kg (145 lb)   SpO2 98%   BMI 19.67 kg/m  Vitals reviewed Physical Exam  Physical Examination: General appearance - alert, tired appearing, and in no distress Mental status - alert, oriented to person, place, and time Eyes - pupils equal and reactive, extraocular eye movements intact, no conjunctival injection, no scleral icterus Mouth - mucous membranes dry, OP clear Neck - supple, no significant adenopathy Chest - clear to auscultation, no wheezes, rales or rhonchi, symmetric air entry Heart - normal rate, regular rhythm, normal S1, S2, no murmurs, rubs, clicks or gallops Abdomen - soft, nontender, nondistended, no masses or organomegaly, nabs Neurological - alert, oriented, normal speech, strength 5/5 in extremities x 4, decreased sensation over right foot at surgical site- chronic Extremities - peripheral pulses normal, no pedal edema, right foot s/p partial amputation, some redness around incision site, serous drainage, no streaking up leg, improved per family and photos in recent podiatry notes Skin - normal coloration and turgor, no rashes, surgical site as above on right foot- some erythema and drainage on bandage   ED Treatments / Results  Labs (all labs ordered are listed, but only abnormal results are displayed) Labs Reviewed  COMPREHENSIVE METABOLIC PANEL - Abnormal; Notable for the following components:      Result Value   Sodium 128 (*)    Potassium 3.4 (*)    Chloride 98 (*)    CO2 19 (*)    Glucose, Bld 154 (*)    BUN 33 (*)    Creatinine, Ser 2.69 (*)    Total Protein 6.4 (*)    GFR calc non  Af Amer 21 (*)    GFR calc Af Amer 24 (*)    All other components within normal limits  CBC WITH DIFFERENTIAL/PLATELET - Abnormal; Notable for the following components:   WBC 11.3 (*)    RBC 3.78 (*)    Hemoglobin 10.9 (*)    HCT 32.2 (*)    All  other components within normal limits  I-STAT CG4 LACTIC ACID, ED - Abnormal; Notable for the following components:   Lactic Acid, Venous 2.38 (*)    All other components within normal limits  CULTURE, BLOOD (ROUTINE X 2)  CULTURE, BLOOD (ROUTINE X 2)  URINE CULTURE  PROTIME-INR  URINALYSIS, ROUTINE W REFLEX MICROSCOPIC  I-STAT TROPONIN, ED  I-STAT CG4 LACTIC ACID, ED    EKG  EKG Interpretation  Date/Time:  Monday September 09 2017 14:57:46 EST Ventricular Rate:  99 PR Interval:    QRS Duration: 100 QT Interval:  354 QTC Calculation: 454 R Axis:   46 Text Interpretation:  Atrial fibrillation Nonspecific ST abnormality Abnormal ECG Since previous tracing irregular rhythm is new Confirmed by Alfonzo Beers [phone]) on 09/09/2017 4:05:59 PM       Radiology Dg Chest Portable 1 View  Result Date: 09/09/2017 CLINICAL DATA:  83 year old male with a history of low blood pressure EXAM: PORTABLE CHEST 1 VIEW COMPARISON:  06/12/2017 FINDINGS: Cardiomediastinal silhouette unchanged. No evidence of central vascular congestion. No pneumothorax or pleural effusion. No confluent airspace disease. No acute displaced fracture.  Degenerative changes of the shoulders. IMPRESSION: Low lung volumes and chronic changes with no evidence of superimposed acute cardiopulmonary disease Electronically Signed   By: Corrie Mckusick D.O.   On: 09/09/2017 15:25    Procedures Procedures (including critical care time)  Medications Ordered in ED Medications  piperacillin-tazobactam (ZOSYN) IVPB 2.25 g (0 g Intravenous Stopped 09/09/17 1717)  vancomycin (VANCOCIN) IVPB 750 mg/150 ml premix (not administered)  sodium chloride 0.9 % bolus 1,000 mL (0 mLs Intravenous Stopped 09/09/17 1719)  vancomycin (VANCOCIN) IVPB 750 mg/150 ml premix (0 mg Intravenous Stopped 09/09/17 1748)  sodium chloride 0.9 % bolus 1,000 mL (0 mLs Intravenous Stopped 09/09/17 1803)     Initial Impression / Assessment and Plan / ED Course  I  have reviewed the triage vital signs and the nursing notes.  Pertinent labs & imaging results that were available during my care of the patient were reviewed by me and considered in my medical decision making (see chart for details).   pt with weakness, syncope, found to have elevated BUN/creatinine most likely prerenal.  Pt with mild leukocytosis and lactic acid elevated- treated with broad spectrum abx and IV fluids- lactate improved after fluids.  No UTI or pneumonia found on workup.  Pt did have partial foot amputation and has been on po abx as an outpatient- wound is improving per family and notes, but possible source of infection.  D/w triad for admission.  Family is agreeable with this plan.     Final Clinical Impressions(s) / ED Diagnoses   Final diagnoses:  Syncope, unspecified syncope type  Acute renal failure, unspecified acute renal failure type [facility])    ED Discharge Orders    None       Pixie Casino, MD 09/09/17 Bosie Helper

## 2017-09-09 NOTE — ED Notes (Signed)
Pt's family advised RN pt took home pain medication in room, MD notified

## 2017-09-09 NOTE — ED Notes (Signed)
I stat lactic acid results given to Dr. Wentz by B. Zeb Rawl, EMT 

## 2017-09-09 NOTE — Progress Notes (Signed)
  Subjective:  Patient ID: Derrick [name], male    DOB: Jul 29, 1932,  MRN: [id number]  Chief Complaint  Patient presents with  . Routine Post Op    POV # 2/ DOS 08/14/17 s/p amputation of remaiming Right toes    DOS: 08/14/17 Procedure: R TMA  83 y.o. male returns for post-op check. Denies N/V/F/Ch. Pain is not controlled with current medications.  Patient is having a lot of pain.  Wound VAC was removed on 1112 by Dr. Jacqualyn Posey.   Objective:   General AA&O x3. Normal mood and affect.  Vascular Foot warm and well perfused.  Neurologic Gross sensation intact.  Dermatologic Skin healing well without signs of infection. Skin edges well coapted without signs of infection.  Orthopedic: Tenderness to palpation noted about the surgical site.    Assessment & Plan:  Patient was evaluated and treated and all questions answered.  S/p R TMA -Progressing as expected post-operatively. -Sutures: intact. -Medications refilled: Rx Dilaudid -Foot redressed.  Return in about 1 week (around 08/29/2017) for Post-op.

## 2017-09-09 NOTE — ED Notes (Signed)
Meal tray delivered.

## 2017-09-09 NOTE — ED Notes (Signed)
Paged admitting about patient's BP and current pain medication orders.

## 2017-09-09 NOTE — ED Triage Notes (Signed)
Pt sent here by pcp for multiple syncopal episodes, sob and weakness.

## 2017-09-10 ENCOUNTER — Telehealth: Payer: Self-pay | Admitting: Podiatry

## 2017-09-10 ENCOUNTER — Encounter (HOSPITAL_COMMUNITY): Payer: Self-pay | Admitting: *Deleted

## 2017-09-10 ENCOUNTER — Other Ambulatory Visit: Payer: Self-pay

## 2017-09-10 DIAGNOSIS — N179 Acute kidney failure, unspecified: Secondary | ICD-10-CM | POA: Diagnosis not present

## 2017-09-10 DIAGNOSIS — N183 Chronic kidney disease, stage 3 (moderate): Secondary | ICD-10-CM | POA: Diagnosis not present

## 2017-09-10 DIAGNOSIS — R5381 Other malaise: Secondary | ICD-10-CM | POA: Diagnosis not present

## 2017-09-10 DIAGNOSIS — R55 Syncope and collapse: Secondary | ICD-10-CM | POA: Diagnosis not present

## 2017-09-10 DIAGNOSIS — I10 Essential (primary) hypertension: Secondary | ICD-10-CM | POA: Diagnosis not present

## 2017-09-10 DIAGNOSIS — I9589 Other hypotension: Secondary | ICD-10-CM | POA: Diagnosis not present

## 2017-09-10 DIAGNOSIS — R630 Anorexia: Secondary | ICD-10-CM | POA: Diagnosis not present

## 2017-09-10 DIAGNOSIS — Z9889 Other specified postprocedural states: Secondary | ICD-10-CM

## 2017-09-10 DIAGNOSIS — E861 Hypovolemia: Secondary | ICD-10-CM | POA: Diagnosis not present

## 2017-09-10 DIAGNOSIS — E869 Volume depletion, unspecified: Secondary | ICD-10-CM | POA: Diagnosis not present

## 2017-09-10 LAB — CBC
HEMATOCRIT: 27.8 % — AB (ref 39.0–52.0)
Hemoglobin: 9.1 g/dL — ABNORMAL LOW (ref 13.0–17.0)
MCH: 27.7 pg (ref 26.0–34.0)
MCHC: 32.7 g/dL (ref 30.0–36.0)
MCV: 84.8 fL (ref 78.0–100.0)
PLATELETS: 232 10*3/uL (ref 150–400)
RBC: 3.28 MIL/uL — ABNORMAL LOW (ref 4.22–5.81)
RDW: 15.2 % (ref 11.5–15.5)
WBC: 6.6 10*3/uL (ref 4.0–10.5)

## 2017-09-10 LAB — BASIC METABOLIC PANEL
Anion gap: 10 (ref 5–15)
BUN: 26 mg/dL — AB (ref 6–20)
CHLORIDE: 100 mmol/L — AB (ref 101–111)
CO2: 21 mmol/L — ABNORMAL LOW (ref 22–32)
CREATININE: 2.01 mg/dL — AB (ref 0.61–1.24)
Calcium: 8.6 mg/dL — ABNORMAL LOW (ref 8.9–10.3)
GFR calc Af Amer: 34 mL/min — ABNORMAL LOW (ref >60)
GFR, EST NON AFRICAN AMERICAN: 29 mL/min — AB (ref >60)
GLUCOSE: 94 mg/dL (ref 65–99)
POTASSIUM: 3.1 mmol/L — AB (ref 3.5–5.1)
SODIUM: 131 mmol/L — AB (ref 135–145)

## 2017-09-10 LAB — URINE CULTURE: Culture: <10000 — AB

## 2017-09-10 MED ORDER — POTASSIUM CHLORIDE CRYS ER 20 MEQ PO TBCR
40.0000 meq | EXTENDED_RELEASE_TABLET | Freq: Once | ORAL | Status: AC
  Administered 2017-09-10: 40 meq via ORAL
  Filled 2017-09-10: qty 2

## 2017-09-10 NOTE — Telephone Encounter (Signed)
I'm calling for my husband. He is scheduled to come in tomorrow at 1:45 pm however he is in the hospital. There is a chance he may not make his appointment tomorrow. If you would like to speak to me, you can give me a call as I will be here with him the biggest part of the day. Mobile number is [phone].

## 2017-09-10 NOTE — Progress Notes (Signed)
PROGRESS NOTE    Derrick [name]  JQB:[id number] DOB: 05/13/1932 DOA: 09/09/2017 PCP: [name], MD   Chief Complaint  Patient presents with  . Loss of Consciousness  . Shortness of Breath    Brief Narrative:  HPI on 12/3/218 by Dr. Roney Jaffe Derrick [name] is a 83 y.o. male with hx of COPD, mild CKD, HL, HTN, prostate cancer, hx RCCa sp nephrectomy presented to ED today w/ recurrent syncope.  In eD BP was in the 70's, BP's imrpoved after NS bolus to 90's - 100's and pt feeling better.  Labs show ^B/Cr. Asked to see for syncope and AKI.    In August pt had paronychia of the R great toe which was complicated by infection then gangrene.  He underwent angiogram right leg Aug 23rd by IR Dr [name] w/ balloon PTA to the ant tibial artery.  R PT artery couldn't be crossed d/t calcification. He got worse and underwent R 1/2 toe amps on 06/13/17 by Dr Jerilynn Mages. Price.  He then had another IR procedure by Dr [name] on Nov 1st with PTA to prox AT and PT. They were unable to treat the distal tibial vessels/pedal vessels due to "dense coral-reef calcification".  The foot progressed and he was admitted and underwent R TMA on 11/7/ 18, again by Dr [name].    He has had persistent pain in the R foot / TMA stump and was taking percocet but said it  wasn't working so was changed to po dilaudid.  He was started recently on oral abx, cipro and clindamycin and has been taking these.    He has had a lot of "dry heaves" and sig nausea.  Not eating or drinkiing much.  He has passed out several times in the last few days at home.  No CP or SOB, no abd pain, no fevers or chills.    Assessment & Plan   Syncope/Hypotension -likely secondary to dehydration from nausea and poor oral intake secondary to possible wound infection -will order orthostatic vitals -Patient's blood pressure was as low as 71/41, currently stable -Continue IV fluids and monitor closely  Right transmetarsal wound infection -has been on several  oral antibiotics and has failed -patient had osteomyelitis and gangrene of her right toes with nonhealing surgical wounds secondary to PAD -Status post transmetatarsal amputation November 2018 -Have placed a call to patient's podiatrist, Dr. [name]. Waiting on call back -Continue vancomycin/Zosyn -Blood cultures are no growth to date -Wound culture pending  Acute kidney injury on chronic kidney injury, stage III -secondary to dehydration and hypotension vs medications  Normocytic anemia -Drop in hemoglobin likely dilutional, continue to monitor CBC  PAD  -with recently RLE revascularization with IR  -Continue aspirin, Plavix  Essential hypertension -medications on hold secondary to to hypotension  GERD -Continue PPI  History of prostate cancer and renal cell cancer  DVT Prophylaxis  lovenox  Code Status: Full  Family Communication: none at bedside  Disposition Plan:Currently in observation  Consultants Podiatry  Procedures  None  Antibiotics   Anti-infectives (From admission, onward)   Start     Dose/Rate Route Frequency Ordered Stop   09/11/17 1700  vancomycin (VANCOCIN) IVPB 750 mg/150 ml premix     750 mg 150 mL/hr over 60 Minutes Intravenous Every 48 hours 09/09/17 1754     09/10/17 1000  hydroxychloroquine (PLAQUENIL) tablet 200 mg     200 mg Oral Daily 09/09/17 2020     09/09/17 1700  piperacillin-tazobactam (  ZOSYN) IVPB 2.25 g     2.25 g 100 mL/hr over 30 Minutes Intravenous Every 8 hours 09/09/17 1624     09/09/17 1700  vancomycin (VANCOCIN) IVPB 750 mg/150 ml premix     750 mg 150 mL/hr over 60 Minutes Intravenous  Once 09/09/17 1624 09/09/17 1748      Subjective:   Derrick [name] seen and examined today. Worried about his wife. Denies any current chest pain contrast breath, abdominal pain, nausea vomiting, diarrhea or constipation.  Objective:   Vitals:   09/10/17 0030 09/10/17 0135 09/10/17 0612 09/10/17 0813  BP: 104/83 107/67 109/70  111/65  Pulse: 92 65 70 68  Resp: (!) 21 18 18 18   Temp:  97.8 F (36.6 C) 97.8 F (36.6 C) 97.9 F (36.6 C)  TempSrc:  Oral Oral Oral  SpO2:  95% 98% 100%  Weight:      Height:        Intake/Output Summary (Last 24 hours) at 09/10/2017 1324 Last data filed at 09/10/2017 1221 Gross per 24 hour  Intake 3701.25 ml  Output 1875 ml  Net 1826.25 ml   Filed Weights   09/09/17 1500  Weight: 65.8 kg (145 lb)    Exam  General: Well developed, well nourished, NAD, appears stated age  HEENT: NCAT, mucous membranes moist.   Cardiovascular: S1 S2 auscultated, no rubs, murmurs or gallops. Regular rate and rhythm.  Respiratory: Clear to auscultation bilaterally with equal chest rise  Abdomen: Soft, nontender, nondistended, + bowel sounds  Extremities: warm dry without cyanosis clubbing or edema. Right foot-transmetatarsal amputation- with open wounds and drainage, no erythema noted.   Neuro: AAOx3 (not year), nonfocal  Psych: Normal affect and demeanor with intact judgement and insight   Data Reviewed: I have personally reviewed following labs and imaging studies  CBC: Recent Labs  Lab 09/09/17 1503 09/10/17 0508  WBC 11.3* 6.6  NEUTROABS 7.2  --   HGB 10.9* 9.1*  HCT 32.2* 27.8*  MCV 85.2 84.8  PLT 337 144   Basic Metabolic Panel: Recent Labs  Lab 09/09/17 1503 09/10/17 0508  NA 128* 131*  K 3.4* 3.1*  CL 98* 100*  CO2 19* 21*  GLUCOSE 154* 94  BUN 33* 26*  CREATININE 2.69* 2.01*  CALCIUM 9.4 8.6*   GFR: Estimated Creatinine Clearance: 26.4 mL/min (A) (by C-G formula based on SCr of 2.01 mg/dL (H)). Liver Function Tests: Recent Labs  Lab 09/09/17 1503  AST 23  ALT 21  ALKPHOS 51  BILITOT 0.8  PROT 6.4*  ALBUMIN 3.5   No results for input(s): LIPASE, AMYLASE in the last 168 hours. No results for input(s): AMMONIA in the last 168 hours. Coagulation Profile: Recent Labs  Lab 09/09/17 1503  INR 1.10   Cardiac Enzymes: No results for input(s):  CKTOTAL, CKMB, CKMBINDEX, TROPONINI in the last 168 hours. BNP (last 3 results) No results for input(s): PROBNP in the last 8760 hours. HbA1C: No results for input(s): HGBA1C in the last 72 hours. CBG: No results for input(s): GLUCAP in the last 168 hours. Lipid Profile: No results for input(s): CHOL, HDL, LDLCALC, TRIG, CHOLHDL, LDLDIRECT in the last 72 hours. Thyroid Function Tests: No results for input(s): TSH, T4TOTAL, FREET4, T3FREE, THYROIDAB in the last 72 hours. Anemia Panel: No results for input(s): VITAMINB12, FOLATE, FERRITIN, TIBC, IRON, RETICCTPCT in the last 72 hours. Urine analysis:    Component Value Date/Time   COLORURINE YELLOW 09/09/2017 1724   APPEARANCEUR CLEAR 09/09/2017 1724   LABSPEC 1.009  09/09/2017 1724   PHURINE 6.0 09/09/2017 1724   GLUCOSEU NEGATIVE 09/09/2017 1724   HGBUR NEGATIVE 09/09/2017 1724   BILIRUBINUR NEGATIVE 09/09/2017 1724   [place] 09/09/2017 1724   PROTEINUR NEGATIVE 09/09/2017 1724   UROBILINOGEN 1.0 11/27/2013 0947   NITRITE NEGATIVE 09/09/2017 1724   LEUKOCYTESUR NEGATIVE 09/09/2017 1724   Sepsis Labs: @LABRCNTIP (procalcitonin:4,lacticidven:4)  ) Recent Results (from the past 240 hour(s))  Culture, blood (Routine x 2)     Status: None (Preliminary result)   Collection Time: 09/09/17  3:16 PM  Result Value Ref Range Status   Specimen Description BLOOD RIGHT ANTECUBITAL  Final   Special Requests   Final    BOTTLES DRAWN AEROBIC AND ANAEROBIC Blood Culture adequate volume   Culture NO GROWTH < 24 HOURS  Final   Report Status PENDING  Incomplete  Culture, blood (Routine x 2)     Status: None (Preliminary result)   Collection Time: 09/09/17  3:29 PM  Result Value Ref Range Status   Specimen Description BLOOD LEFT ANTECUBITAL  Final   Special Requests   Final    BOTTLES DRAWN AEROBIC AND ANAEROBIC Blood Culture adequate volume   Culture NO GROWTH < 24 HOURS  Final   Report Status PENDING  Incomplete  Aerobic  Culture (superficial specimen)     Status: None (Preliminary result)   Collection Time: 09/09/17  8:18 PM  Result Value Ref Range Status   Specimen Description WOUND FOOT  Final   Special Requests AMPUTATION  Final   Gram Stain   Final    FEW WBC PRESENT, PREDOMINANTLY PMN RARE GRAM POSITIVE RODS    Culture TOO YOUNG TO READ  Final   Report Status PENDING  Incomplete      Radiology Studies: Dg Chest Portable 1 View  Result Date: 09/09/2017 CLINICAL DATA:  83 year old male with a history of low blood pressure EXAM: PORTABLE CHEST 1 VIEW COMPARISON:  06/12/2017 FINDINGS: Cardiomediastinal silhouette unchanged. No evidence of central vascular congestion. No pneumothorax or pleural effusion. No confluent airspace disease. No acute displaced fracture.  Degenerative changes of the shoulders. IMPRESSION: Low lung volumes and chronic changes with no evidence of superimposed acute cardiopulmonary disease Electronically Signed   By: Corrie Mckusick D.O.   On: 09/09/2017 15:25     Scheduled Meds: . amitriptyline  25 mg Oral Daily  . aspirin EC  81 mg Oral QHS  . clopidogrel  75 mg Oral Daily  . enoxaparin (LOVENOX) injection  30 mg Subcutaneous Q24H  . finasteride  5 mg Oral Daily  . hydroxychloroquine  200 mg Oral Daily  . multivitamin with minerals  1 tablet Oral Daily  . pantoprazole  40 mg Oral BID  . predniSONE  5 mg Oral Q breakfast  . vitamin B-12  1,000 mcg Oral Daily   Continuous Infusions: . sodium chloride 75 mL/hr at 09/09/17 2028  . piperacillin-tazobactam (ZOSYN)  IV 2.25 g (09/10/17 1136)  . [START ON 09/11/2017] vancomycin       LOS: 0 days   Time Spent in minutes   30 minutes  Benedetta Sundstrom D.O. on 09/10/2017 at 1:24 PM  Between 7am to 7pm - Pager - [phone]  After 7pm go to [email or web address] - password [email or web address]  And look for the night coverage person covering for me after hours  [facility] Office  [phone]

## 2017-09-10 NOTE — Care Management Obs Status (Signed)
[place] NOTIFICATION   Patient Details  Name: Derrick [name] MRN: [id number] Date of Birth: 08-02-1932   Medicare Observation Status Notification Given:  Yes    Carles Collet, RN 09/10/2017, 10:07 AM

## 2017-09-10 NOTE — Consult Note (Signed)
Patient Identification Derrick [name] is a 83 y.o. male. DOB:  21-Aug-1932 Admit Date:  09/09/2017 Attending Provider:  Cristal Ford, DO                                 Primary Care Physician:  [name], MD  Admitting Diagnosis: Acute renal failure, unspecified acute renal failure type ([place]) [N17.9] Syncope, unspecified syncope type [R55]  Subjective:    Reason for Consultation: R Foot Eval  History of present illness: Patient well known to Probation officer. Patient is s/p R TMA (DOS 08/14/17). Last seen 11/29 where dehiscence noted but no signs of infection observed. Patient presented to the ED yesterday with generalized weakness, SOB, decreased appetite, syncopal episodes. Was admitted for acute renal failure, syncope, and hypotension.  At bedside patient eating dinner with good appetite. Pain controlled to his foot. Alert and oriented. Denies active complaints.  Review of Systems Denies N/V/F/Ch/SOB.  Objective:   Vitals:   09/10/17 0813 09/10/17 1720  BP: 111/65 128/86  Pulse: 68 83  Resp: 18 18  Temp: 97.9 F (36.6 C) 98.4 F (36.9 C)  SpO2: 100% 99%   General AA&O x3. Normal mood and affect.  Vascular R foot amputation stump warm.   Neurologic Epicritic sensation grossly present.  Dermatologic R foot amputation stump healing with intact sutures. Some evidence of suture failure, central dehiscence with probing. Serous drainage but not purulence expressible. No erythema. No signs of acute infection.  Orthopedic: Pain to palpation R foot amputation site.    Results for orders placed or performed during the hospital encounter of 09/09/17 (from the past 24 hour(s))  Basic metabolic panel     Status: Abnormal   Collection Time: 09/10/17  5:08 AM  Result Value Ref Range   Sodium 131 (L) 135 - 145 mmol/L   Potassium 3.1 (L) 3.5 - 5.1 mmol/L   Chloride 100 (L) 101 - 111 mmol/L   CO2 21 (L) 22 - 32 mmol/L   Glucose, Bld 94 65 - 99 mg/dL   BUN 26 (H) 6 - 20 mg/dL   Creatinine, Ser  2.01 (H) 0.61 - 1.24 mg/dL   Calcium 8.6 (L) 8.9 - 10.3 mg/dL   GFR calc non Af Amer 29 (L) >60 mL/min   GFR calc Af Amer 34 (L) >60 mL/min   Anion gap 10 5 - 15  CBC     Status: Abnormal   Collection Time: 09/10/17  5:08 AM  Result Value Ref Range   WBC 6.6 4.0 - 10.5 K/uL   RBC 3.28 (L) 4.22 - 5.81 MIL/uL   Hemoglobin 9.1 (L) 13.0 - 17.0 g/dL   HCT 27.8 (L) 39.0 - 52.0 %   MCV 84.8 78.0 - 100.0 fL   MCH 27.7 26.0 - 34.0 pg   MCHC 32.7 30.0 - 36.0 g/dL   RDW 15.2 11.5 - 15.5 %   Platelets 232 150 - 400 K/uL   Assessment/Plan:   S/p R Foot TMA -Dressing removed, amputation stump inspected. No signs of acute infection. -Patient appears to be improving since admission. VSSAF. Slight leukocytosis on admission, now resolved. Blood cultures NGTD -Amputation site improved since last eval. Still with central dehiscence. Continue local wound care. In absence of acute infection no plans for operative debridement at this time. -Wound culture likely to be contaminants and of little clinical value. Operative cultures from 11/7 polymicrobial - would consider broad spectrum coverage. Aim for total  of 6 weeks of therapy - would consider arranging IV abx as outpatient for an additional two weeks. -Recommend foot XR to monitor for worsening signs of osteomyelitis. -Podiatry will continue to follow. -Please contact with questions or concerns.  Total Floor Time: 30 mins

## 2017-09-11 ENCOUNTER — Observation Stay (HOSPITAL_COMMUNITY): Payer: Medicare Other

## 2017-09-11 ENCOUNTER — Telehealth: Payer: Self-pay | Admitting: Podiatry

## 2017-09-11 ENCOUNTER — Ambulatory Visit: Payer: Medicare Other | Admitting: Podiatry

## 2017-09-11 DIAGNOSIS — Z8249 Family history of ischemic heart disease and other diseases of the circulatory system: Secondary | ICD-10-CM | POA: Diagnosis not present

## 2017-09-11 DIAGNOSIS — Z905 Acquired absence of kidney: Secondary | ICD-10-CM | POA: Diagnosis not present

## 2017-09-11 DIAGNOSIS — Z7952 Long term (current) use of systemic steroids: Secondary | ICD-10-CM | POA: Diagnosis not present

## 2017-09-11 DIAGNOSIS — I10 Essential (primary) hypertension: Secondary | ICD-10-CM | POA: Diagnosis not present

## 2017-09-11 DIAGNOSIS — E7800 Pure hypercholesterolemia, unspecified: Secondary | ICD-10-CM | POA: Diagnosis not present

## 2017-09-11 DIAGNOSIS — T8781 Dehiscence of amputation stump: Secondary | ICD-10-CM | POA: Diagnosis not present

## 2017-09-11 DIAGNOSIS — I2510 Atherosclerotic heart disease of native coronary artery without angina pectoris: Secondary | ICD-10-CM | POA: Diagnosis present

## 2017-09-11 DIAGNOSIS — I129 Hypertensive chronic kidney disease with stage 1 through stage 4 chronic kidney disease, or unspecified chronic kidney disease: Secondary | ICD-10-CM | POA: Diagnosis not present

## 2017-09-11 DIAGNOSIS — M7989 Other specified soft tissue disorders: Secondary | ICD-10-CM | POA: Diagnosis not present

## 2017-09-11 DIAGNOSIS — E861 Hypovolemia: Secondary | ICD-10-CM | POA: Diagnosis not present

## 2017-09-11 DIAGNOSIS — Z87891 Personal history of nicotine dependence: Secondary | ICD-10-CM | POA: Diagnosis not present

## 2017-09-11 DIAGNOSIS — Z7902 Long term (current) use of antithrombotics/antiplatelets: Secondary | ICD-10-CM | POA: Diagnosis not present

## 2017-09-11 DIAGNOSIS — Z823 Family history of stroke: Secondary | ICD-10-CM | POA: Diagnosis not present

## 2017-09-11 DIAGNOSIS — M86679 Other chronic osteomyelitis, unspecified ankle and foot: Secondary | ICD-10-CM | POA: Diagnosis not present

## 2017-09-11 DIAGNOSIS — J449 Chronic obstructive pulmonary disease, unspecified: Secondary | ICD-10-CM | POA: Diagnosis present

## 2017-09-11 DIAGNOSIS — E11621 Type 2 diabetes mellitus with foot ulcer: Secondary | ICD-10-CM | POA: Diagnosis not present

## 2017-09-11 DIAGNOSIS — M86271 Subacute osteomyelitis, right ankle and foot: Secondary | ICD-10-CM | POA: Diagnosis not present

## 2017-09-11 DIAGNOSIS — K219 Gastro-esophageal reflux disease without esophagitis: Secondary | ICD-10-CM | POA: Diagnosis present

## 2017-09-11 DIAGNOSIS — Y835 Amputation of limb(s) as the cause of abnormal reaction of the patient, or of later complication, without mention of misadventure at the time of the procedure: Secondary | ICD-10-CM | POA: Diagnosis present

## 2017-09-11 DIAGNOSIS — E871 Hypo-osmolality and hyponatremia: Secondary | ICD-10-CM | POA: Diagnosis not present

## 2017-09-11 DIAGNOSIS — I959 Hypotension, unspecified: Secondary | ICD-10-CM | POA: Diagnosis present

## 2017-09-11 DIAGNOSIS — Z7982 Long term (current) use of aspirin: Secondary | ICD-10-CM | POA: Diagnosis not present

## 2017-09-11 DIAGNOSIS — N183 Chronic kidney disease, stage 3 (moderate): Secondary | ICD-10-CM | POA: Diagnosis not present

## 2017-09-11 DIAGNOSIS — M069 Rheumatoid arthritis, unspecified: Secondary | ICD-10-CM | POA: Diagnosis present

## 2017-09-11 DIAGNOSIS — Z85528 Personal history of other malignant neoplasm of kidney: Secondary | ICD-10-CM | POA: Diagnosis not present

## 2017-09-11 DIAGNOSIS — M86671 Other chronic osteomyelitis, right ankle and foot: Secondary | ICD-10-CM | POA: Diagnosis not present

## 2017-09-11 DIAGNOSIS — M869 Osteomyelitis, unspecified: Secondary | ICD-10-CM | POA: Diagnosis not present

## 2017-09-11 DIAGNOSIS — N179 Acute kidney failure, unspecified: Secondary | ICD-10-CM | POA: Diagnosis not present

## 2017-09-11 DIAGNOSIS — T8149XA Infection following a procedure, other surgical site, initial encounter: Secondary | ICD-10-CM | POA: Diagnosis not present

## 2017-09-11 DIAGNOSIS — Z8673 Personal history of transient ischemic attack (TIA), and cerebral infarction without residual deficits: Secondary | ICD-10-CM | POA: Diagnosis not present

## 2017-09-11 DIAGNOSIS — Z89431 Acquired absence of right foot: Secondary | ICD-10-CM | POA: Diagnosis not present

## 2017-09-11 DIAGNOSIS — I739 Peripheral vascular disease, unspecified: Secondary | ICD-10-CM | POA: Diagnosis not present

## 2017-09-11 DIAGNOSIS — F410 Panic disorder [episodic paroxysmal anxiety] without agoraphobia: Secondary | ICD-10-CM | POA: Diagnosis present

## 2017-09-11 DIAGNOSIS — I9589 Other hypotension: Secondary | ICD-10-CM | POA: Diagnosis not present

## 2017-09-11 DIAGNOSIS — Z8546 Personal history of malignant neoplasm of prostate: Secondary | ICD-10-CM | POA: Diagnosis not present

## 2017-09-11 LAB — BASIC METABOLIC PANEL
ANION GAP: 12 (ref 5–15)
BUN: 16 mg/dL (ref 6–20)
CO2: 21 mmol/L — AB (ref 22–32)
Calcium: 8.8 mg/dL — ABNORMAL LOW (ref 8.9–10.3)
Chloride: 101 mmol/L (ref 101–111)
Creatinine, Ser: 1.43 mg/dL — ABNORMAL HIGH (ref 0.61–1.24)
GFR, EST AFRICAN AMERICAN: 51 mL/min — AB (ref >60)
GFR, EST NON AFRICAN AMERICAN: 44 mL/min — AB (ref >60)
GLUCOSE: 91 mg/dL (ref 65–99)
POTASSIUM: 3.8 mmol/L (ref 3.5–5.1)
Sodium: 134 mmol/L — ABNORMAL LOW (ref 135–145)

## 2017-09-11 LAB — CBC
HEMATOCRIT: 28.2 % — AB (ref 39.0–52.0)
HEMOGLOBIN: 9.3 g/dL — AB (ref 13.0–17.0)
MCH: 28.4 pg (ref 26.0–34.0)
MCHC: 33 g/dL (ref 30.0–36.0)
MCV: 86 fL (ref 78.0–100.0)
Platelets: 233 10*3/uL (ref 150–400)
RBC: 3.28 MIL/uL — AB (ref 4.22–5.81)
RDW: 15.3 % (ref 11.5–15.5)
WBC: 6.8 10*3/uL (ref 4.0–10.5)

## 2017-09-11 MED ORDER — ENOXAPARIN SODIUM 40 MG/0.4ML ~~LOC~~ SOLN
40.0000 mg | SUBCUTANEOUS | Status: DC
  Administered 2017-09-11: 40 mg via SUBCUTANEOUS
  Filled 2017-09-11 (×2): qty 0.4

## 2017-09-11 MED ORDER — VANCOMYCIN HCL IN DEXTROSE 1-5 GM/200ML-% IV SOLN
1000.0000 mg | INTRAVENOUS | Status: DC
  Administered 2017-09-11 – 2017-09-12 (×2): 1000 mg via INTRAVENOUS
  Filled 2017-09-11 (×2): qty 200

## 2017-09-11 MED ORDER — PIPERACILLIN-TAZOBACTAM 3.375 G IVPB
3.3750 g | Freq: Three times a day (TID) | INTRAVENOUS | Status: DC
  Administered 2017-09-11 – 2017-09-16 (×14): 3.375 g via INTRAVENOUS
  Filled 2017-09-11 (×16): qty 50

## 2017-09-11 NOTE — Progress Notes (Signed)
PROGRESS NOTE    Derrick [name]  BZJ:[id number] DOB: 28-May-1932 DOA: 09/09/2017 PCP: [name], MD     Brief Narrative:  Derrick Iovino Odomis a 83 y.o.malewith hx of COPD, mild CKD, HL, HTN, prostate cancer, hx RCCa sp nephrectomy presented to ED w/ recurrent syncope. In ED, BP was in the 70's, BP's improved after NS bolus to 90's-100's and pt feeling better. Patient was admitted due to syncope and AKI.   In August pt had paronychia of the R great toe which was complicated by infection then gangrene. He underwent angiogram right legAug 23rd by IR Dr [name] w/ balloon PTA to the ant tibial artery. R PT artery couldn't be crossed due to calcification. He got worse and underwent R 1/2 toe amputation on 06/13/17 by Dr. [name]. He then had another IR procedure by Dr [name] on Nov 1st with PTA to prox AT and PT. They were unable to treat the distal tibial vessels/pedal vesselsdue to "dense coral-reef calcification". The foot progressed and he was admitted and underwent R TMA on 08/14/17, again by Dr [name]. He has had persistent pain in the R foot / TMA stump and was taking percocet but said itwasn't working so was changed to po dilaudid. He was started recently on oral abx, cipro and clindamycin and has been taking these.   Assessment & Plan:   Principal Problem:   Hypotension Active Problems:   Hypertension   CKD (chronic kidney disease), stage III (HCC)   Hyponatremia   COPD (chronic obstructive pulmonary disease) (HCC)   Malaise   Anorexia   Volume depletion   AKI (acute kidney injury) ([place])   Syncope   Syncope/Hypotension -Likely secondary to dehydration from nausea and poor oral intake secondary to possible wound infection -Improved BP with IVF  -Obtain repeat orthostatic VS and PT OT consult   Right transmetarsal wound infection -Has been on several outpatient oral antibiotics and has failed (per pharmacy: doxycycline 9/17, keflex 11/7, clindamycin 11/12, augmentin  11/20, bactrim 11/20, and cipro 11/30)  -Patient had osteomyelitis and gangrene of right toes with nonhealing surgical wounds secondary to PAD s/p transmetatarsal amputation November 2018 -Blood cultures are no growth to date -Wound culture pending -Continue vancomycin/Zosyn -Podiatry consulted and following  -Foot xray today revealed large soft tissue ulceration overlying distal portion residual fifth metatarsal with possible lytic destruction suggesting osteomyelitis. Will await further Podiatry recommendations for possible surgical intervention versus prolonged IV antibiotic therapy.   Acute kidney injury on chronic kidney injury, stage III -Secondary to dehydration and hypotension -Improving with IVF, trend BMP   Normocytic anemia -Drop in hemoglobin likely dilutional, Hgb remained stable   PAD  -With recently RLE revascularization with IR  -Continue aspirin, Plavix  Essential hypertension -Medications on hold secondary to to hypotension  GERD -Continue PPI    DVT prophylaxis: lovenox Code Status: full Family Communication: wife and son at bedside Disposition Plan: pending improvement    Consultants:   Podiatry  Procedures:   None   Antimicrobials:  Anti-infectives (From admission, onward)   Start     Dose/Rate Route Frequency Ordered Stop   09/11/17 1700  vancomycin (VANCOCIN) IVPB 750 mg/150 ml premix  Status:  Discontinued     750 mg 150 mL/hr over 60 Minutes Intravenous Every 48 hours 09/09/17 1754 09/11/17 0840   09/11/17 1400  piperacillin-tazobactam (ZOSYN) IVPB 3.375 g     3.375 g 12.5 mL/hr over 240 Minutes Intravenous Every 8 hours 09/11/17 0840  09/11/17 1000  vancomycin (VANCOCIN) IVPB 1000 mg/200 mL premix     1,000 mg 200 mL/hr over 60 Minutes Intravenous Every 24 hours 09/11/17 0840     09/10/17 1000  hydroxychloroquine (PLAQUENIL) tablet 200 mg     200 mg Oral Daily 09/09/17 2020     09/09/17 1700  piperacillin-tazobactam (ZOSYN) IVPB  2.25 g  Status:  Discontinued     2.25 g 100 mL/hr over 30 Minutes Intravenous Every 8 hours 09/09/17 1624 09/11/17 0840   09/09/17 1700  vancomycin (VANCOCIN) IVPB 750 mg/150 ml premix     750 mg 150 mL/hr over 60 Minutes Intravenous  Once 09/09/17 1624 09/09/17 1748       Subjective: Patient without new complaints today.  Continues to have pain in his right foot.  Objective: Vitals:   09/10/17 0813 09/10/17 1720 09/11/17 0609 09/11/17 0828  BP: 111/65 128/86 136/71 137/79  Pulse: 68 83 79 100  Resp: 18 18 16 17   Temp: 97.9 F (36.6 C) 98.4 F (36.9 C) 98.2 F (36.8 C) 98.4 F (36.9 C)  TempSrc: Oral Oral Oral Oral  SpO2: 100% 99% 100% 100%  Weight:      Height:        Intake/Output Summary (Last 24 hours) at 09/11/2017 1625 Last data filed at 09/11/2017 1433 Gross per 24 hour  Intake 468.75 ml  Output 700 ml  Net -231.25 ml   Filed Weights   09/09/17 1500  Weight: 65.8 kg (145 lb)    Examination:  General exam: Appears calm and comfortable  Respiratory system: Clear to auscultation. Respiratory effort normal. Cardiovascular system: S1 & S2 heard, RRR. No JVD, murmurs, rubs, gallops or clicks. No pedal edema. Gastrointestinal system: Abdomen is nondistended, soft and nontender. No organomegaly or masses felt. Normal bowel sounds heard. Central nervous system: Alert and oriented. No focal neurological deficits. Extremities: +Right foot s/p transmetatarsal amputation  Skin: +Right foot without erythema, sutures in place, serous drainage   Psychiatry: Judgement and insight appear normal. Mood & affect appropriate.   Data Reviewed: I have personally reviewed following labs and imaging studies  CBC: Recent Labs  Lab 09/09/17 1503 09/10/17 0508 09/11/17 0658  WBC 11.3* 6.6 6.8  NEUTROABS 7.2  --   --   HGB 10.9* 9.1* 9.3*  HCT 32.2* 27.8* 28.2*  MCV 85.2 84.8 86.0  PLT 337 232 299   Basic Metabolic Panel: Recent Labs  Lab 09/09/17 1503 09/10/17 0508  09/11/17 0658  NA 128* 131* 134*  K 3.4* 3.1* 3.8  CL 98* 100* 101  CO2 19* 21* 21*  GLUCOSE 154* 94 91  BUN 33* 26* 16  CREATININE 2.69* 2.01* 1.43*  CALCIUM 9.4 8.6* 8.8*   GFR: Estimated Creatinine Clearance: 37.1 mL/min (A) (by C-G formula based on SCr of 1.43 mg/dL (H)). Liver Function Tests: Recent Labs  Lab 09/09/17 1503  AST 23  ALT 21  ALKPHOS 51  BILITOT 0.8  PROT 6.4*  ALBUMIN 3.5   No results for input(s): LIPASE, AMYLASE in the last 168 hours. No results for input(s): AMMONIA in the last 168 hours. Coagulation Profile: Recent Labs  Lab 09/09/17 1503  INR 1.10   Cardiac Enzymes: No results for input(s): CKTOTAL, CKMB, CKMBINDEX, TROPONINI in the last 168 hours. BNP (last 3 results) No results for input(s): PROBNP in the last 8760 hours. HbA1C: No results for input(s): HGBA1C in the last 72 hours. CBG: No results for input(s): GLUCAP in the last 168 hours. Lipid Profile: No  results for input(s): CHOL, HDL, LDLCALC, TRIG, CHOLHDL, LDLDIRECT in the last 72 hours. Thyroid Function Tests: No results for input(s): TSH, T4TOTAL, FREET4, T3FREE, THYROIDAB in the last 72 hours. Anemia Panel: No results for input(s): VITAMINB12, FOLATE, FERRITIN, TIBC, IRON, RETICCTPCT in the last 72 hours. Sepsis Labs: Recent Labs  Lab 09/09/17 1508 09/09/17 1750  LATICACIDVEN 2.38* 1.65    Recent Results (from the past 240 hour(s))  Culture, blood (Routine x 2)     Status: None (Preliminary result)   Collection Time: 09/09/17  3:16 PM  Result Value Ref Range Status   Specimen Description BLOOD RIGHT ANTECUBITAL  Final   Special Requests   Final    BOTTLES DRAWN AEROBIC AND ANAEROBIC Blood Culture adequate volume   Culture NO GROWTH 2 DAYS  Final   Report Status PENDING  Incomplete  Culture, blood (Routine x 2)     Status: None (Preliminary result)   Collection Time: 09/09/17  3:29 PM  Result Value Ref Range Status   Specimen Description BLOOD LEFT ANTECUBITAL   Final   Special Requests   Final    BOTTLES DRAWN AEROBIC AND ANAEROBIC Blood Culture adequate volume   Culture NO GROWTH 2 DAYS  Final   Report Status PENDING  Incomplete  Urine Culture     Status: Abnormal   Collection Time: 09/09/17  5:26 PM  Result Value Ref Range Status   Specimen Description URINE, RANDOM  Final   Special Requests NONE  Final   Culture <10,000 COLONIES/mL INSIGNIFICANT GROWTH (A)  Final   Report Status 09/10/2017 FINAL  Final  Aerobic Culture (superficial specimen)     Status: None (Preliminary result)   Collection Time: 09/09/17  8:18 PM  Result Value Ref Range Status   Specimen Description WOUND FOOT  Final   Special Requests AMPUTATION  Final   Gram Stain   Final    FEW WBC PRESENT, PREDOMINANTLY PMN RARE GRAM POSITIVE RODS    Culture CULTURE REINCUBATED FOR BETTER GROWTH  Final   Report Status PENDING  Incomplete       Radiology Studies: Dg Foot Complete Right  Result Date: 09/11/2017 CLINICAL DATA:  Right foot pain and swelling. EXAM: RIGHT FOOT COMPLETE - 3+ VIEW COMPARISON:  Radiographs of September 02, 2017. FINDINGS: Vascular calcifications are noted. Status post surgical amputation of all 5 distal metatarsals and phalanges. There appears to be a large ulceration overlying distal portion of residual fifth metatarsal with possible lytic defects seen in the distal portion of residual fifth metatarsal. This may represent osteomyelitis. IMPRESSION: Status post surgical amputation of all 5 distal metatarsals and phalanges. Probable large soft tissue ulceration is seen overlying distal portion of residual fifth metatarsal with possible lytic destruction suggesting osteomyelitis. MRI may be performed for further evaluation. Electronically Signed   By: Marijo Conception, M.D.   On: 09/11/2017 10:44      Scheduled Meds: . amitriptyline  25 mg Oral Daily  . aspirin EC  81 mg Oral QHS  . clopidogrel  75 mg Oral Daily  . enoxaparin (LOVENOX) injection  40 mg  Subcutaneous Q24H  . finasteride  5 mg Oral Daily  . hydroxychloroquine  200 mg Oral Daily  . multivitamin with minerals  1 tablet Oral Daily  . pantoprazole  40 mg Oral BID  . predniSONE  5 mg Oral Q breakfast  . vitamin B-12  1,000 mcg Oral Daily   Continuous Infusions: . sodium chloride 75 mL/hr at 09/11/17 1330  . piperacillin-tazobactam (  ZOSYN)  IV 3.375 g (09/11/17 1331)  . vancomycin Stopped (09/11/17 1044)     LOS: 0 days    Time spent: 40 minutes   Dessa Phi, DO [facility] [email or web address] Password [email or web address] 09/11/2017, 4:25 PM

## 2017-09-11 NOTE — Telephone Encounter (Signed)
My name is Derrick [name] and I'm calling about Derrick [name]. He is in the hospital right now with some issues revolving around his foot. We need to know what needs to be done or if anything has been done. Has anyone been up to see him? The best number to reach me at is [phone]. Thank you.

## 2017-09-11 NOTE — Evaluation (Signed)
Physical Therapy Evaluation Patient Details Name: Derrick [name] MRN: [id number] DOB: 10-16-1931 Today's Date: 09/11/2017   History of Present Illness  Pt is an 83 y/o male admitted secondary to Hypotension, R transmetatarsal wound infection. PMH includes R transmetatarsal amputation, COPD, CKD, HTN, PAD, prostate cancer, and s/p nephrectomy.  Clinical Impression  Pt admitted secondary to problem above with deficits below. PTA, pt reports he was using RW for ambulation, however, reports he had falls at home. Upon eval, pt presenting with R foot pain, decreased strength, instability, and cognitive deficits. Pt presenting with STM deficits, however, unsure if this is baseline, as no family present. Pt unsteady with gait and requiring min guard to min A with use of RW. Pt wanting to go home at this time. Will need to ensure family ability to provide 24/7 support at home. If able recommending, HHPT to increase safety with mobility. If not able to provide 24/7 assist, will need SNF to increase safety with mobility prior to return home. Will continue to follow acutely to maximize functional mobility independence and safety.     Follow Up Recommendations Home health PT;Supervision/Assistance - 24 hour    Equipment Recommendations  3in1 (PT)    Recommendations for Other Services       Precautions / Restrictions Precautions Precautions: Fall Restrictions Weight Bearing Restrictions: No      Mobility  Bed Mobility Overal bed mobility: Needs Assistance Bed Mobility: Supine to Sit     Supine to sit: Supervision     General bed mobility comments: Supervision for safety. Verbal cues to wait for PT to stand.   Transfers Overall transfer level: Needs assistance Equipment used: Rolling walker (2 wheeled) Transfers: Sit to/from Stand Sit to Stand: Min guard         General transfer comment: Min guard for steadying assist. Verbal cues for safe hand placement.    Ambulation/Gait Ambulation/Gait assistance: Min guard;Min assist Ambulation Distance (Feet): 20 Feet Assistive device: Rolling walker (2 wheeled) Gait Pattern/deviations: Step-to pattern;Decreased step length - right;Decreased step length - left;Decreased weight shift to right;Antalgic Gait velocity: Decreased Gait velocity interpretation: Below normal speed for age/gender General Gait Details: Slow, unsteady, antalgic gait. Required safety cues for safe use of RW and to take his time with ambulation. Step to pattern and pt walking on R heel. Limited weightshift to RLE. Unsteady requiring min guard to min A.   Stairs            Wheelchair Mobility    Modified Rankin (Stroke Patients Only)       Balance Overall balance assessment: Needs assistance Sitting-balance support: No upper extremity supported;Feet supported Sitting balance-Leahy Scale: Good     Standing balance support: Bilateral upper extremity supported;During functional activity Standing balance-Leahy Scale: Poor Standing balance comment: Reliant on BUE and external support for balance.                              Pertinent Vitals/Pain      Home Living Family/patient expects to be discharged to:: Private residence Living Arrangements: Spouse/significant other Available Help at Discharge: Family;Available 24 hours/day Type of Home: House Home Access: Stairs to enter Entrance Stairs-Rails: Right;Left;Can reach both Entrance Stairs-Number of Steps: 2 Home Layout: One level Home Equipment: Clinical cytogeneticist - 2 wheels;Cane - single point;Wheelchair - manual      Prior Function Level of Independence: Independent with assistive device(s)         Comments: Used  RW      Hand Dominance   Dominant Hand: Right    Extremity/Trunk Assessment   Upper Extremity Assessment Upper Extremity Assessment: Defer to OT evaluation    Lower Extremity Assessment Lower Extremity Assessment: RLE  deficits/detail RLE Deficits / Details: RLE bandaged. Limited WB secondary to pain during ambulation.      Cervical / Trunk Assessment Cervical / Trunk Assessment: Kyphotic  Communication   Communication: No difficulties  Cognition Arousal/Alertness: Awake/alert Behavior During Therapy: WFL for tasks assessed/performed Overall Cognitive Status: No family/caregiver present to determine baseline cognitive functioning                                 General Comments: STM deficits noted. Unsure if this is baseline as no family was present. Unsure of why they were keeping him at the hospital and why he could not get up to get his pants.       General Comments General comments (skin integrity, edema, etc.): Educated about need for assist with mobility at home, as pt wanting to go home at this time. Educated about use of WC at home to increase safety with mobility and recommendations for HHPT.     Exercises     Assessment/Plan    PT Assessment Patient needs continued PT services  PT Problem List Decreased strength;Decreased balance;Decreased mobility;Decreased cognition;Decreased knowledge of use of DME;Decreased safety awareness;Decreased knowledge of precautions;Pain       PT Treatment Interventions DME instruction;Gait training;Stair training;Functional mobility training;Therapeutic activities;Therapeutic exercise;Balance training;Neuromuscular re-education;Patient/family education;Cognitive remediation    PT Goals (Current goals can be found in the Care Plan section)  Acute Rehab PT Goals Patient Stated Goal: to go home  PT Goal Formulation: With patient Time For Goal Achievement: 09/25/17 Potential to Achieve Goals: Good    Frequency Min 3X/week   Barriers to discharge Other (comment) Unsure of assist able to be provided at home.     Co-evaluation               AM-PAC PT "6 Clicks" Daily Activity  Outcome Measure Difficulty turning over in bed  (including adjusting bedclothes, sheets and blankets)?: A Little Difficulty moving from lying on back to sitting on the side of the bed? : A Little Difficulty sitting down on and standing up from a chair with arms (e.g., wheelchair, bedside commode, etc,.)?: Unable Help needed moving to and from a bed to chair (including a wheelchair)?: A Little Help needed walking in hospital room?: A Little Help needed climbing 3-5 steps with a railing? : A Lot 6 Click Score: 15    End of Session Equipment Utilized During Treatment: Gait belt Activity Tolerance: Patient tolerated treatment well Patient left: in bed;with call bell/phone within reach;with bed alarm set Nurse Communication: Mobility status PT Visit Diagnosis: Unsteadiness on feet (R26.81);Other abnormalities of gait and mobility (R26.89);Difficulty in walking, not elsewhere classified (R26.2);Pain Pain - Right/Left: Right Pain - part of body: Ankle and joints of foot    Time: 4174-0814 PT Time Calculation (min) (ACUTE ONLY): 26 min   Charges:   PT Evaluation $PT Eval Moderate Complexity: 1 Mod PT Treatments $Gait Training: 8-22 mins   PT G Codes:   PT G-Codes **NOT FOR INPATIENT CLASS** Functional Assessment Tool Used: AM-PAC 6 Clicks Basic Mobility;Clinical judgement Functional Limitation: Mobility: Walking and moving around Mobility: Walking and Moving Around Current Status (G8185): At least 40 percent but less than 60 percent impaired, limited  or restricted Mobility: Walking and Moving Around Goal Status [phone]): At least 1 percent but less than 20 percent impaired, limited or restricted    Leighton Ruff, PT, DPT  Acute Rehabilitation Services  Pager: [place] 09/11/2017, 7:02 PM

## 2017-09-11 NOTE — Telephone Encounter (Signed)
Burnell Blanks called, stating that they had taken an xray of patient's foot and he wanted to let you know. I told him that you have been in there to see the patient and you would give him a call if needed

## 2017-09-11 NOTE — Progress Notes (Signed)
Pharmacy Antibiotic Note  Derrick [name] is a 83 y.o. male admitted on 09/09/2017 with syncopal episode, sent from PCP office. Starting empiric abx for sepsis. Pt had recent partial amputation of R foot. Podiatry evaluated yesterday and is not planning to take patient for any debridement. Aiming for 6 weeks total of treatment for polymicrobial growth from intra-op cultures.  SCr has improved since admission- down to 1.43 this morning with eCrCl ~35-26mL/min. Noted patient never received vancomycin load as per plan (medication was not ordered).  WBC down, afebrile  Plan: -Vancomycin 1000mg  IV q24h -Zosyn 3.375g IV q8h EI -Monitor c/s, clinical progression, renal function, VR as needed (if planning to discharge on vancomycin, will obtain level prior to discharge)   Height: 6' (182.9 cm) Weight: 145 lb (65.8 kg) IBW/kg (Calculated) : 77.6  Temp (24hrs), Avg:98.3 F (36.8 C), Min:98.2 F (36.8 C), Max:98.4 F (36.9 C)  Recent Labs  Lab 09/09/17 1503 09/09/17 1508 09/09/17 1750 09/10/17 0508 09/11/17 0658  WBC 11.3*  --   --  6.6 6.8  CREATININE 2.69*  --   --  2.01* 1.43*  LATICACIDVEN  --  2.38* 1.65  --   --     Estimated Creatinine Clearance: 37.1 mL/min (A) (by C-G formula based on SCr of 1.43 mg/dL (H)).    Allergies  Allergen Reactions  . Ace Inhibitors Other (See Comments)    Reaction:  Unknown     Antimicrobials this admission: 12/3 vancomycin > 12/3 zosyn >  Dose adjustments this admission: 12/5: vancomycin increased to 1g q24h (from 750mg  q48h); Zosyn increased to 3.375g IV q8h (from 2.25g IV q8h)  Microbiology results: 12/3 blood cx: ngtd 12/3 urine cx: insignificant growth 12/3 foot wound: rare GPR  Rufino Staup D. Belal Scallon, PharmD, BCPS Clinical Pharmacist Clinical Phone for 09/11/2017 until 3:30pm: [phone] If after 3:30pm, please call main pharmacy at [phone] 09/11/2017 8:39 AM

## 2017-09-12 DIAGNOSIS — N183 Chronic kidney disease, stage 3 (moderate): Secondary | ICD-10-CM

## 2017-09-12 DIAGNOSIS — I739 Peripheral vascular disease, unspecified: Secondary | ICD-10-CM

## 2017-09-12 DIAGNOSIS — M86271 Subacute osteomyelitis, right ankle and foot: Secondary | ICD-10-CM

## 2017-09-12 DIAGNOSIS — N179 Acute kidney failure, unspecified: Secondary | ICD-10-CM

## 2017-09-12 LAB — CK: Total CK: 47 U/L — ABNORMAL LOW (ref 49–397)

## 2017-09-12 LAB — BASIC METABOLIC PANEL
Anion gap: 9 (ref 5–15)
BUN: 11 mg/dL (ref 6–20)
CHLORIDE: 104 mmol/L (ref 101–111)
CO2: 22 mmol/L (ref 22–32)
CREATININE: 1.31 mg/dL — AB (ref 0.61–1.24)
Calcium: 8.7 mg/dL — ABNORMAL LOW (ref 8.9–10.3)
GFR calc Af Amer: 57 mL/min — ABNORMAL LOW (ref >60)
GFR calc non Af Amer: 49 mL/min — ABNORMAL LOW (ref >60)
GLUCOSE: 85 mg/dL (ref 65–99)
POTASSIUM: 3.5 mmol/L (ref 3.5–5.1)
SODIUM: 135 mmol/L (ref 135–145)

## 2017-09-12 LAB — AEROBIC CULTURE W GRAM STAIN (SUPERFICIAL SPECIMEN)

## 2017-09-12 LAB — CBC
HEMATOCRIT: 27.7 % — AB (ref 39.0–52.0)
Hemoglobin: 9.1 g/dL — ABNORMAL LOW (ref 13.0–17.0)
MCH: 28.3 pg (ref 26.0–34.0)
MCHC: 32.9 g/dL (ref 30.0–36.0)
MCV: 86.3 fL (ref 78.0–100.0)
Platelets: 211 10*3/uL (ref 150–400)
RBC: 3.21 MIL/uL — ABNORMAL LOW (ref 4.22–5.81)
RDW: 15.4 % (ref 11.5–15.5)
WBC: 6.1 10*3/uL (ref 4.0–10.5)

## 2017-09-12 LAB — AEROBIC CULTURE  (SUPERFICIAL SPECIMEN)

## 2017-09-12 MED ORDER — SODIUM CHLORIDE 0.9 % IV SOLN
6.0000 mg/kg | INTRAVENOUS | Status: DC
  Administered 2017-09-13 – 2017-09-15 (×3): 400 mg via INTRAVENOUS
  Filled 2017-09-12 (×5): qty 8

## 2017-09-12 NOTE — Progress Notes (Signed)
IV RN attempted to place PICC per MD order.  Patient pleasantly confused.  IV RN unable to proceed with procedure.  Patient RN made aware, call bell within reach.

## 2017-09-12 NOTE — Progress Notes (Signed)
Pharmacy Antibiotic Note  Derrick [name] is a 83 y.o. male admitted on 09/09/2017 with syncopal episode, sent from PCP office. Starting empiric abx for sepsis. Pt had recent partial amputation of R foot.  Podiatry evaluated yesterday, ID is also on board.  Changing vancomycin to daptomycin for preservation of kidney function. Last vancomycin was given this morning ~1000.  SCr has improved since admission- down to 1.3 this morning with eCrCl ~40-108mL/min. WBC down, remains afebrile  Plan: Daptomycin 6mg /kg (400mg ) IV q24h starting tomorrow at 1000 (24h after last vancomycin dose) Zosyn 3.375g IV q8h EI Monitor c/s, clinical progression, renal function, weekly CK on Fridays (ordered to start tomorrow with AM labs)   Height: 6' (182.9 cm) Weight: 147 lb (66.7 kg) IBW/kg (Calculated) : 77.6  Temp (24hrs), Avg:98.2 F (36.8 C), Min:97.6 F (36.4 C), Max:98.6 F (37 C)  Recent Labs  Lab 09/09/17 1503 09/09/17 1508 09/09/17 1750 09/10/17 0508 09/11/17 0658 09/12/17 0535  WBC 11.3*  --   --  6.6 6.8 6.1  CREATININE 2.69*  --   --  2.01* 1.43* 1.31*  LATICACIDVEN  --  2.38* 1.65  --   --   --     Estimated Creatinine Clearance: 41 mL/min (A) (by C-G formula based on SCr of 1.31 mg/dL (H)).    Allergies  Allergen Reactions  . Ace Inhibitors Other (See Comments)    Reaction:  Unknown     Antimicrobials this admission: Vancomycin 12/3> >12/6 Zosyn 12/3> > Daptomycin 12/7 >>  Dose adjustments this admission: 12/5: vancomycin increased to 1g q24h (from 750mg  q48h); Zosyn increased to 3.375g IV q8h (from 2.25g IV q8h)  Microbiology results: 12/3 blood cx: ngtd 12/3 urine cx: insignificant growth 12/3 foot wound: rare GPR on gram stain. Multiple organisms grew on culture, none predominant   Derrick [name] D. Derrick [name], PharmD, BCPS Clinical Pharmacist Clinical Phone for 09/12/2017 until 3:30pm: [phone] If after 3:30pm, please call main pharmacy at [phone] 09/12/2017 3:43 PM

## 2017-09-12 NOTE — Consult Note (Addendum)
[facility]    Date of Admission:  09/09/2017   Total days of antibiotics: 3 vanco/zosyn               Reason for Consult: [name]    Referring Provider: Osteomyelitis R foot   Assessment: Osteomyelitis of R foot Peripheral vascular disease AKI on CKD    Plan: 1. Would consider surgical re-eval 2. And deep cultures (his current Cx are not indicative of his ongoing process) 3. I am not sure he will heal with his vasculopathy.  4. Will discuss PIC with renal. (Ok per Dr Melvia Heaps) 5. Change his vanco to dapto to hopefully prevent nephrotoxicity  6. His Cr has improved 7. Consider WOC eval  Thank you so much for this interesting consult,  Principal Problem:   Hypotension Active Problems:   Hypertension   CKD (chronic kidney disease), stage III (HCC)   Hyponatremia   COPD (chronic obstructive pulmonary disease) (HCC)   Malaise   Anorexia   Volume depletion   AKI (acute kidney injury) ([place])   Syncope   . amitriptyline  25 mg Oral Daily  . aspirin EC  81 mg Oral QHS  . clopidogrel  75 mg Oral Daily  . enoxaparin (LOVENOX) injection  40 mg Subcutaneous Q24H  . finasteride  5 mg Oral Daily  . hydroxychloroquine  200 mg Oral Daily  . multivitamin with minerals  1 tablet Oral Daily  . pantoprazole  40 mg Oral BID  . predniSONE  5 mg Oral Q breakfast  . vitamin B-12  1,000 mcg Oral Daily    HPI: Derrick [name] is a 83 y.o. male with hx of COPD, CKD 3, prev nephrectomy, brought to ED on 12-3 with recurrent syncope. He was hypotensive in ED. He was noted to have increase in Cr to 2.69 (baseline ~ 1.3).  He had had n/v prior to adm.  He had paronychia of R great toe 05-2017 which lead to gangrene of toe. He underwent balloon PTA to anterior tibial artery as part of this eval. His posterior tibial artery could not be done due to calcification. He did not improve and underwent R trans-met amputation 08-14-17. A bone cx done at that time grew: RARE  STAPHYLOCOCCUS AUREUS (I- cipro) RARE ENTEROBACTER CLOACAE (R- cefazolin) RARE DIPHTHEROIDS(CORYNEBACTERIUM SPECIES)  He continued to have pain and had cipro/clinda added to try to improve his wound (11-30).   Due to several episodes of "passing out" he came to ED on 12-3. He was afebrile and he had WBC 11.3.  He was started on vanco/zosyn.  He was seen by his podiatrist and noted to have dehiscence of his wound. He had plain films done showing suspicion for osteomyelitis over 5th MT.  He had wound swab done on 12-3 which is poly-microbial.   Antibiotic history per pharmacy:  doxycycline 9/17  keflex 11/7  clindamycin 11/12  augmentin 11/20  bactrim 11/20 cipro 11/30    Review of Systems: Review of Systems  Constitutional: Negative for chills and fever.  Respiratory: Negative for shortness of breath.   Gastrointestinal: Positive for nausea and vomiting. Negative for constipation and diarrhea.  Genitourinary: Negative for dysuria.  Neurological: Positive for dizziness and loss of consciousness.  Please see HPI. All other systems reviewed and negative.   Past Medical History:  Diagnosis Date  . Anemia   . Anxiety   . Arthritis, rheumatoid ([place])   . BPH (benign prostatic hyperplasia)   .  CAD (coronary artery disease)   . CKD (chronic kidney disease)   . COPD (chronic obstructive pulmonary disease) ([place])   . Depression   . Diverticulosis   . Elevated PSA   . Gangrene ([place])    of toe right foot  . GERD (gastroesophageal reflux disease)   . Herpes simplex labialis   . Hiatal hernia   . High cholesterol   . History of colonic polyps   . Hypertension   . Insomnia   . Lacunar infarction   . Memory loss   . Obesity   . Panic attacks   . Prostate cancer ([place])   . Renal cell cancer ([place])    s/p nephrectomy in 2000  . Seropositive rheumatoid arthritis ([place])   . Spermatocele   . Tinnitus   . Tremor of both hands     Social History   Tobacco Use  . Smoking status:  Former Smoker    Packs/day: 1.00    Years: 29.00    Pack years: 29.00    Types: Cigarettes    Last attempt to quit: 10/08/1978    Years since quitting: 38.9  . Smokeless tobacco: Never Used  . Tobacco comment: tried smokeless tobacco once but made him sick  Substance Use Topics  . Alcohol use: No    Comment: drank very little in his life - only socially   . Drug use: No    Family History  Problem Relation Age of Onset  . Heart attack Father 87  . Stroke Father   . Kidney failure Sister        Bright's disease   . Cancer Paternal Aunt        d. 76s; NOS cancer of leg  . Kidney disease Sister        d. 70; "Bright's disease" - possible kidney cancer?  . Cancer Sister        NOS cancer; d. 65s; "sick in the hospital"  . Heart attack Son 68  . Leukemia Son 97  . COPD Son   . Breast cancer Daughter 58     Medications:  Scheduled: . amitriptyline  25 mg Oral Daily  . aspirin EC  81 mg Oral QHS  . clopidogrel  75 mg Oral Daily  . enoxaparin (LOVENOX) injection  40 mg Subcutaneous Q24H  . finasteride  5 mg Oral Daily  . hydroxychloroquine  200 mg Oral Daily  . multivitamin with minerals  1 tablet Oral Daily  . pantoprazole  40 mg Oral BID  . predniSONE  5 mg Oral Q breakfast  . vitamin B-12  1,000 mcg Oral Daily    Abtx:  Anti-infectives (From admission, onward)   Start     Dose/Rate Route Frequency Ordered Stop   09/11/17 1700  vancomycin (VANCOCIN) IVPB 750 mg/150 ml premix  Status:  Discontinued     750 mg 150 mL/hr over 60 Minutes Intravenous Every 48 hours 09/09/17 1754 09/11/17 0840   09/11/17 1400  piperacillin-tazobactam (ZOSYN) IVPB 3.375 g     3.375 g 12.5 mL/hr over 240 Minutes Intravenous Every 8 hours 09/11/17 0840     09/11/17 1000  vancomycin (VANCOCIN) IVPB 1000 mg/200 mL premix     1,000 mg 200 mL/hr over 60 Minutes Intravenous Every 24 hours 09/11/17 0840     09/10/17 1000  hydroxychloroquine (PLAQUENIL) tablet 200 mg     200 mg Oral Daily 09/09/17  2020     09/09/17 1700  piperacillin-tazobactam (ZOSYN) IVPB 2.25 g  Status:  Discontinued     2.25 g 100 mL/hr over 30 Minutes Intravenous Every 8 hours 09/09/17 1624 09/11/17 0840   09/09/17 1700  vancomycin (VANCOCIN) IVPB 750 mg/150 ml premix     750 mg 150 mL/hr over 60 Minutes Intravenous  Once 09/09/17 1624 09/09/17 1748        OBJECTIVE: Blood pressure 125/63, pulse 90, temperature 98.2 F (36.8 C), temperature source Oral, resp. rate 18, height 6' (1.829 m), weight 66.7 kg (147 lb), SpO2 100 %.  Physical Exam  Constitutional: He is well-developed, well-nourished, and in no distress.  HENT:  Mouth/Throat: No oropharyngeal exudate.  Eyes: EOM are normal. Pupils are equal, round, and reactive to light. Left conjunctiva is injected.  Neck: Neck supple.  Cardiovascular: Normal rate, regular rhythm and normal heart sounds.  Pulmonary/Chest: Effort normal and breath sounds normal.  Abdominal: Soft. Bowel sounds are normal. There is no tenderness. There is no rebound.  Musculoskeletal:       Feet:  Lymphadenopathy:    He has no cervical adenopathy.  Psychiatric: Affect normal.    Lab Results Results for orders placed or performed during the hospital encounter of 09/09/17 (from the past 48 hour(s))  Basic metabolic panel     Status: Abnormal   Collection Time: 09/11/17  6:58 AM  Result Value Ref Range   Sodium 134 (L) 135 - 145 mmol/L   Potassium 3.8 3.5 - 5.1 mmol/L   Chloride 101 101 - 111 mmol/L   CO2 21 (L) 22 - 32 mmol/L   Glucose, Bld 91 65 - 99 mg/dL   BUN 16 6 - 20 mg/dL   Creatinine, Ser 1.43 (H) 0.61 - 1.24 mg/dL   Calcium 8.8 (L) 8.9 - 10.3 mg/dL   GFR calc non Af Amer 44 (L) >60 mL/min   GFR calc Af Amer 51 (L) >60 mL/min    Comment: (NOTE) The eGFR has been calculated using the CKD EPI equation. This calculation has not been validated in all clinical situations. eGFR's persistently <60 mL/min signify possible Chronic Kidney Disease.    Anion gap 12 5  - 15  CBC     Status: Abnormal   Collection Time: 09/11/17  6:58 AM  Result Value Ref Range   WBC 6.8 4.0 - 10.5 K/uL   RBC 3.28 (L) 4.22 - 5.81 MIL/uL   Hemoglobin 9.3 (L) 13.0 - 17.0 g/dL   HCT 28.2 (L) 39.0 - 52.0 %   MCV 86.0 78.0 - 100.0 fL   MCH 28.4 26.0 - 34.0 pg   MCHC 33.0 30.0 - 36.0 g/dL   RDW 15.3 11.5 - 15.5 %   Platelets 233 150 - 400 K/uL  Basic metabolic panel     Status: Abnormal   Collection Time: 09/12/17  5:35 AM  Result Value Ref Range   Sodium 135 135 - 145 mmol/L   Potassium 3.5 3.5 - 5.1 mmol/L   Chloride 104 101 - 111 mmol/L   CO2 22 22 - 32 mmol/L   Glucose, Bld 85 65 - 99 mg/dL   BUN 11 6 - 20 mg/dL   Creatinine, Ser 1.31 (H) 0.61 - 1.24 mg/dL   Calcium 8.7 (L) 8.9 - 10.3 mg/dL   GFR calc non Af Amer 49 (L) >60 mL/min   GFR calc Af Amer 57 (L) >60 mL/min    Comment: (NOTE) The eGFR has been calculated using the CKD EPI equation. This calculation has not been validated in all clinical situations. eGFR's persistently <  60 mL/min signify possible Chronic Kidney Disease.    Anion gap 9 5 - 15  CBC     Status: Abnormal   Collection Time: 09/12/17  5:35 AM  Result Value Ref Range   WBC 6.1 4.0 - 10.5 K/uL   RBC 3.21 (L) 4.22 - 5.81 MIL/uL   Hemoglobin 9.1 (L) 13.0 - 17.0 g/dL   HCT 27.7 (L) 39.0 - 52.0 %   MCV 86.3 78.0 - 100.0 fL   MCH 28.3 26.0 - 34.0 pg   MCHC 32.9 30.0 - 36.0 g/dL   RDW 15.4 11.5 - 15.5 %   Platelets 211 150 - 400 K/uL      Component Value Date/Time   SDES WOUND FOOT 09/09/2017 2018   [place] AMPUTATION 09/09/2017 2018   CULT MULTIPLE ORGANISMS PRESENT, NONE PREDOMINANT (A) 09/09/2017 2018   REPTSTATUS 09/12/2017 FINAL 09/09/2017 2018   Dg Foot Complete Right  Result Date: 09/11/2017 CLINICAL DATA:  Right foot pain and swelling. EXAM: RIGHT FOOT COMPLETE - 3+ VIEW COMPARISON:  Radiographs of September 02, 2017. FINDINGS: Vascular calcifications are noted. Status post surgical amputation of all 5 distal metatarsals and  phalanges. There appears to be a large ulceration overlying distal portion of residual fifth metatarsal with possible lytic defects seen in the distal portion of residual fifth metatarsal. This may represent osteomyelitis. IMPRESSION: Status post surgical amputation of all 5 distal metatarsals and phalanges. Probable large soft tissue ulceration is seen overlying distal portion of residual fifth metatarsal with possible lytic destruction suggesting osteomyelitis. MRI may be performed for further evaluation. Electronically Signed   By: Marijo Conception, M.D.   On: 09/11/2017 10:44   Recent Results (from the past 240 hour(s))  Culture, blood (Routine x 2)     Status: None (Preliminary result)   Collection Time: 09/09/17  3:16 PM  Result Value Ref Range Status   Specimen Description BLOOD RIGHT ANTECUBITAL  Final   Special Requests   Final    BOTTLES DRAWN AEROBIC AND ANAEROBIC Blood Culture adequate volume   Culture NO GROWTH 3 DAYS  Final   Report Status PENDING  Incomplete  Culture, blood (Routine x 2)     Status: None (Preliminary result)   Collection Time: 09/09/17  3:29 PM  Result Value Ref Range Status   Specimen Description BLOOD LEFT ANTECUBITAL  Final   Special Requests   Final    BOTTLES DRAWN AEROBIC AND ANAEROBIC Blood Culture adequate volume   Culture NO GROWTH 3 DAYS  Final   Report Status PENDING  Incomplete  Urine Culture     Status: Abnormal   Collection Time: 09/09/17  5:26 PM  Result Value Ref Range Status   Specimen Description URINE, RANDOM  Final   Special Requests NONE  Final   Culture <10,000 COLONIES/mL INSIGNIFICANT GROWTH (A)  Final   Report Status 09/10/2017 FINAL  Final  Aerobic Culture (superficial specimen)     Status: Abnormal   Collection Time: 09/09/17  8:18 PM  Result Value Ref Range Status   Specimen Description WOUND FOOT  Final   Special Requests AMPUTATION  Final   Gram Stain   Final    FEW WBC PRESENT, PREDOMINANTLY PMN RARE GRAM POSITIVE RODS      Culture MULTIPLE ORGANISMS PRESENT, NONE PREDOMINANT (A)  Final   Report Status 09/12/2017 FINAL  Final    Microbiology: Recent Results (from the past 240 hour(s))  Culture, blood (Routine x 2)     Status: None (Preliminary result)  Collection Time: 09/09/17  3:16 PM  Result Value Ref Range Status   Specimen Description BLOOD RIGHT ANTECUBITAL  Final   Special Requests   Final    BOTTLES DRAWN AEROBIC AND ANAEROBIC Blood Culture adequate volume   Culture NO GROWTH 3 DAYS  Final   Report Status PENDING  Incomplete  Culture, blood (Routine x 2)     Status: None (Preliminary result)   Collection Time: 09/09/17  3:29 PM  Result Value Ref Range Status   Specimen Description BLOOD LEFT ANTECUBITAL  Final   Special Requests   Final    BOTTLES DRAWN AEROBIC AND ANAEROBIC Blood Culture adequate volume   Culture NO GROWTH 3 DAYS  Final   Report Status PENDING  Incomplete  Urine Culture     Status: Abnormal   Collection Time: 09/09/17  5:26 PM  Result Value Ref Range Status   Specimen Description URINE, RANDOM  Final   Special Requests NONE  Final   Culture <10,000 COLONIES/mL INSIGNIFICANT GROWTH (A)  Final   Report Status 09/10/2017 FINAL  Final  Aerobic Culture (superficial specimen)     Status: Abnormal   Collection Time: 09/09/17  8:18 PM  Result Value Ref Range Status   Specimen Description WOUND FOOT  Final   Special Requests AMPUTATION  Final   Gram Stain   Final    FEW WBC PRESENT, PREDOMINANTLY PMN RARE GRAM POSITIVE RODS    Culture MULTIPLE ORGANISMS PRESENT, NONE PREDOMINANT (A)  Final   Report Status 09/12/2017 FINAL  Final    Radiographs and labs were personally reviewed by me.   Bobby Rumpf, MD [facility] [phone] 09/12/2017, 2:58 PM

## 2017-09-12 NOTE — Care Management Note (Addendum)
Case Management Note  Patient Details  Name: Derrick [name] MRN: [id number] Date of Birth: Oct 04, 1932  Subjective/Objective:                 Patient admitted with hypotension, sepsis protocol. From home w wife.  Foot xray 12/5 revealed large soft tissue ulceration overlying distal portion residual fifth metatarsal with possible lytic destruction suggesting osteomyelitis. Will await further Podiatry recommendations for possible surgical intervention versus prolonged IV antibiotic therapy.  PT rec HH PT at this time.      Action/Plan:  CM will continue to follow for [facility] PT and possibly RN and IV Abx. Spoke w patient and wife at the bedside, I believe that wife would be able to assist w Home IV Abx treatment, she is agreeable if this is the plan. She states that patient is active w [facility] services but unsure of provider. Records indicate it may be [facility]. Spoke w Colman Cater from [place] who will check get back to me.   Expected Discharge Date:                  Expected Discharge Plan:  [place]  In-House Referral:     Discharge planning Services  CM Consult  Post Acute Care Choice:    Choice offered to:     DME Arranged:    DME Agency:     HH Arranged:    [place] Agency:     Status of Service:  In process, will continue to follow  If discussed at Long Length of Stay Meetings, dates discussed:    Additional Comments:  Carles Collet, RN 09/12/2017, 11:15 AM

## 2017-09-12 NOTE — Evaluation (Signed)
Occupational Therapy Evaluation and Discharge Patient Details Name: Derrick [name] MRN: [id number] DOB: Apr 03, 1932 Today's Date: 09/12/2017    History of Present Illness Pt is an 83 y/o male admitted secondary to Hypotension, R transmetatarsal wound infection. PMH includes R transmetatarsal amputation, COPD, CKD, HTN, PAD, prostate cancer, and s/p nephrectomy.   Clinical Impression   PTA Pt modified independent in ADL with shower chair and long handle sponge. Pt is currently at baseline. Pt's left eye is very red/blood shot, but tested [facility] during vision assessment and Pt has no complaints of blurriness/visual disturbances. Wife will be available for 24 hour assist. Pt and wife educated on compensatory strategies and safety, and they both verbalized understanding. OT to sign off at this point. Thank you for the opportunity to serve this patient.     Follow Up Recommendations  No OT follow up;Supervision/Assistance - 24 hour    Equipment Recommendations  None recommended by OT(Pt has appropriate DME)    Recommendations for Other Services       Precautions / Restrictions Precautions Precautions: Fall Restrictions Weight Bearing Restrictions: No      Mobility Bed Mobility Overal bed mobility: Needs Assistance Bed Mobility: Supine to Sit     Supine to sit: Supervision     General bed mobility comments: Supervision for safety, no assist needed  Transfers Overall transfer level: Needs assistance Equipment used: Rolling walker (2 wheeled) Transfers: Sit to/from Stand Sit to Stand: Min guard         General transfer comment: Min guard for safety - good carryover of safe hand placement from previous PT session    Balance Overall balance assessment: Needs assistance Sitting-balance support: No upper extremity supported;Feet supported Sitting balance-Leahy Scale: Good     Standing balance support: Bilateral upper extremity supported;During functional activity Standing  balance-Leahy Scale: Poor Standing balance comment: Reliant on BUE and external support for balance.                            ADL either performed or assessed with clinical judgement   ADL Overall ADL's : Modified independent;At baseline                                       General ADL Comments: Pt has shower chair and long handle sponge that he uses in his shower for bathing, sits for grooming tasks at sink. dressing RLE first. Educated on compensatory strategies. Pt's wife present during session, confirmed that this is how he's been doing at home and she assists him if and when he needs any help. As long as Pt has 24/hr assist at home, Pt is at appropriate level for dc from OT     Vision Baseline Vision/History: Wears glasses Wears Glasses: At all times Patient Visual Report: No change from baseline Vision Assessment?: Yes Eye Alignment: Within Functional Limits Ocular Range of Motion: Within Functional Limits Alignment/Gaze Preference: Within Defined Limits Tracking/Visual Pursuits: Able to track stimulus in all quads without difficulty Convergence: Within functional limits Visual Fields: No apparent deficits Diplopia Assessment: (none reported by Pt) Additional Comments: Left eye is very bloodshot - Pt unaware, able to read from menu, and from off TV screen. No changes in vision reported by Pt     Perception     Praxis      Pertinent Vitals/Pain Pain Assessment: Faces Faces Pain Scale:  Hurts little more Pain Location: RLE - only with WB Pain Descriptors / Indicators: Discomfort Pain Intervention(s): Limited activity within patient's tolerance;Monitored during session;Repositioned     Hand Dominance Right   Extremity/Trunk Assessment Upper Extremity Assessment Upper Extremity Assessment: Overall WFL for tasks assessed   Lower Extremity Assessment Lower Extremity Assessment: RLE deficits/detail;Defer to PT evaluation RLE Deficits /  Details: RLE bandaged. Limited WB secondary to pain during ambulation.     Cervical / Trunk Assessment Cervical / Trunk Assessment: Kyphotic   Communication Communication Communication: No difficulties   Cognition Arousal/Alertness: Awake/alert Behavior During Therapy: WFL for tasks assessed/performed Overall Cognitive Status: No family/caregiver present to determine baseline cognitive functioning                                 General Comments: STM deficits. Wife nodded to confirm that this is baseline   General Comments  Wife present during session    Exercises     Shoulder Instructions      Home Living Family/patient expects to be discharged to:: Private residence Living Arrangements: Spouse/significant other Available Help at Discharge: Family;Available 24 hours/day Type of Home: House Home Access: Stairs to enter [employer] of Steps: 2 Entrance Stairs-Rails: Right;Left;Can reach both Home Layout: One level     Bathroom Shower/Tub: Occupational psychologist: Handicapped height     Home Equipment: Clinical cytogeneticist - 2 wheels;Cane - single point;Wheelchair - manual   Additional Comments: wife is available 24 hours a day      Prior Functioning/Environment Level of Independence: Independent with assistive device(s)        Comments: Used RW, shower seat        OT Problem List:        OT Treatment/Interventions:      OT Goals(Current goals can be found in the care plan section) Acute Rehab OT Goals Patient Stated Goal: to go home  OT Goal Formulation: With patient/family Time For Goal Achievement: 09/26/17 Potential to Achieve Goals: Good  OT Frequency:     Barriers to D/C:            Co-evaluation              AM-PAC PT "6 Clicks" Daily Activity     Outcome Measure Help from another person eating meals?: None Help from another person taking care of personal grooming?: A Little Help from another person  toileting, which includes using toliet, bedpan, or urinal?: A Little Help from another person bathing (including washing, rinsing, drying)?: A Little Help from another person to put on and taking off regular upper body clothing?: None Help from another person to put on and taking off regular lower body clothing?: A Little 6 Click Score: 20   End of Session Equipment Utilized During Treatment: Gait belt;Rolling walker Nurse Communication: Mobility status  Activity Tolerance: Patient tolerated treatment well Patient left: in bed;with call bell/phone within reach;with bed alarm set;with family/visitor present                   Time: 9381-0175 OT Time Calculation (min): 23 min Charges:  OT General Charges $OT Visit: 1 Visit OT Evaluation $OT Eval Moderate Complexity: 1 Mod OT Treatments $Self Care/Home Management : 8-22 mins G-Codes:     Hulda Humphrey OTR/L [phone]  Derrick [name] 09/12/2017, 10:09 AM

## 2017-09-12 NOTE — Progress Notes (Signed)
Physical Therapy Treatment Patient Details Name: Derrick [name] MRN: [id number] DOB: 05-03-1932 Today's Date: 09/12/2017    History of Present Illness Pt is an 83 y/o male admitted secondary to Hypotension, R transmetatarsal wound infection. PMH includes R transmetatarsal amputation, COPD, CKD, HTN, PAD, prostate cancer, and s/p nephrectomy.    PT Comments    Pt making steady progress with functional mobility. Pt's spouse present during session and confirmed that she would be at home with him 24/7 upon d/c. Pt would continue to benefit from skilled physical therapy services at this time while admitted and after d/c to address the below listed limitations in order to improve overall safety and independence with functional mobility.    Follow Up Recommendations  Home health PT;Supervision/Assistance - 24 hour     Equipment Recommendations  3in1 (PT)    Recommendations for Other Services       Precautions / Restrictions Precautions Precautions: Fall Restrictions Weight Bearing Restrictions: No    Mobility  Bed Mobility Overal bed mobility: Needs Assistance Bed Mobility: Supine to Sit;Sit to Supine     Supine to sit: Supervision Sit to supine: Supervision   General bed mobility comments: Supervision for safety, no assist needed  Transfers Overall transfer level: Needs assistance Equipment used: Rolling walker (2 wheeled) Transfers: Sit to/from Stand Sit to Stand: Min guard         General transfer comment: min guard for safety  Ambulation/Gait Ambulation/Gait assistance: Min guard Ambulation Distance (Feet): 75 Feet Assistive device: Rolling walker (2 wheeled) Gait Pattern/deviations: Decreased step length - right;Decreased step length - left;Decreased stride length;Step-through pattern Gait velocity: Decreased Gait velocity interpretation: Below normal speed for age/gender General Gait Details: slow, unsteady gait with use of RW and min guard for safety. Pt  required cueing to maintain a safe distance from [employer] Mobility    Modified Rankin (Stroke Patients Only)       Balance Overall balance assessment: Needs assistance Sitting-balance support: No upper extremity supported;Feet supported Sitting balance-Leahy Scale: Good     Standing balance support: Bilateral upper extremity supported;During functional activity Standing balance-Leahy Scale: Poor Standing balance comment: Reliant on BUE and external support for balance.                             Cognition Arousal/Alertness: Awake/alert Behavior During Therapy: WFL for tasks assessed/performed(tearful) Overall Cognitive Status: Impaired/Different from baseline Area of Impairment: Memory;Safety/judgement;Problem solving                     Memory: Decreased short-term memory   Safety/Judgement: Decreased awareness of deficits;Decreased awareness of safety   Problem Solving: Difficulty sequencing;Requires verbal cues General Comments: STM deficits. Wife nodded to confirm that this is baseline      Exercises      General Comments General comments (skin integrity, edema, etc.): Wife present during session      Pertinent Vitals/Pain Pain Assessment: Faces Faces Pain Scale: Hurts a little bit Pain Location: R LE  Pain Descriptors / Indicators: Sore Pain Intervention(s): Monitored during session;Repositioned    Home Living Family/patient expects to be discharged to:: Private residence Living Arrangements: Spouse/significant other Available Help at Discharge: Family;Available 24 hours/day Type of Home: House Home Access: Stairs to enter Entrance Stairs-Rails: Right;Left;Can reach both Home Layout: One level Home Equipment: Clinical cytogeneticist - 2 wheels;Cane - single point;Wheelchair - manual  Additional Comments: wife is available 24 hours a day    Prior Function Level of Independence: Independent with assistive  device(s)      Comments: Used RW, shower seat   PT Goals (current goals can now be found in the care plan section) Acute Rehab PT Goals Patient Stated Goal: to go home  PT Goal Formulation: With patient Time For Goal Achievement: 09/25/17 Potential to Achieve Goals: Good Progress towards PT goals: Progressing toward goals    Frequency    Min 3X/week      PT Plan Current plan remains appropriate    Co-evaluation              AM-PAC PT "6 Clicks" Daily Activity  Outcome Measure  Difficulty turning over in bed (including adjusting bedclothes, sheets and blankets)?: None Difficulty moving from lying on back to sitting on the side of the bed? : None Difficulty sitting down on and standing up from a chair with arms (e.g., wheelchair, bedside commode, etc,.)?: Unable Help needed moving to and from a bed to chair (including a wheelchair)?: A Little Help needed walking in hospital room?: A Little Help needed climbing 3-5 steps with a railing? : A Lot 6 Click Score: 17    End of Session Equipment Utilized During Treatment: Gait belt Activity Tolerance: Patient tolerated treatment well Patient left: in bed;with call bell/phone within reach;with bed alarm set;with family/visitor present Nurse Communication: Mobility status PT Visit Diagnosis: Unsteadiness on feet (R26.81);Other abnormalities of gait and mobility (R26.89);Difficulty in walking, not elsewhere classified (R26.2);Pain Pain - Right/Left: Right Pain - part of body: Ankle and joints of foot     Time: 9798-9211 PT Time Calculation (min) (ACUTE ONLY): 14 min  Charges:  $Gait Training: 8-22 mins                    G Codes:       [place], [place], [place] 09/12/2017, 10:32 AM

## 2017-09-12 NOTE — Progress Notes (Signed)
Late Entry: Page MD in regards to red of pt left eye. No new orders at this time.

## 2017-09-12 NOTE — Progress Notes (Signed)
Pt refusing to keep telemetry on and refusing to take zoysn. MD made aware.

## 2017-09-12 NOTE — Progress Notes (Addendum)
PROGRESS NOTE    BRAND SIEVER  KNL:[id number] DOB: 1931/12/18 DOA: 09/09/2017 PCP: [name], MD     Brief Narrative:  Derrick Gibeault Odomis a 83 y.o.malewith hx of COPD, mild CKD, HL, HTN, prostate cancer, hx RCCa sp nephrectomy presented to ED w/ recurrent syncope. In ED, BP was in the 70's, BP's improved after NS bolus to 90's-100's and pt feeling better. Patient was admitted due to syncope and AKI.   In August pt had paronychia of the R great toe which was complicated by infection then gangrene. He underwent angiogram right legAug 23rd by IR Dr [name] w/ balloon PTA to the ant tibial artery. R PT artery couldn't be crossed due to calcification. He got worse and underwent R 1/2 toe amputation on 06/13/17 by Dr. [name]. He then had another IR procedure by Dr [name] on Nov 1st with PTA to prox AT and PT. They were unable to treat the distal tibial vessels/pedal vesselsdue to "dense coral-reef calcification". The foot progressed and he was admitted and underwent R TMA on 08/14/17, again by Dr [name]. He has had persistent pain in the R foot / TMA stump and was taking percocet but said itwasn't working so was changed to po dilaudid. He was started recently on oral abx, cipro and clindamycin and has been taking these.   Assessment & Plan:   Principal Problem:   Hypotension Active Problems:   Hypertension   CKD (chronic kidney disease), stage III (HCC)   Hyponatremia   COPD (chronic obstructive pulmonary disease) (HCC)   Malaise   Anorexia   Volume depletion   AKI (acute kidney injury) ([place])   Syncope   Syncope -Likely secondary to dehydration from nausea and poor oral intake secondary to possible wound infection -Improved BP with IVF  -Repeat orthostatic VS improved. No further syncopal events   Right transmetarsal wound infection -Has been on several outpatient oral antibiotics and has failed (per pharmacy: doxycycline 9/17, keflex 11/7, clindamycin 11/12, augmentin  11/20, bactrim 11/20, and cipro 11/30)  -Patient had osteomyelitis and gangrene of right toes with nonhealing surgical wounds secondary to PAD s/p transmetatarsal amputation November 2018 -Blood cultures are no growth to date -Wound culture with multiple organism  -Continue vancomycin/Zosyn -Podiatry consulted and following  -Foot xray revealed large soft tissue ulceration overlying distal portion residual fifth metatarsal with possible lytic destruction suggesting osteomyelitis. Discussed with Dr. [name] today -ID consult for antibiotic recommendation   Acute kidney injury on chronic kidney injury, stage III -Secondary to dehydration and hypotension -Improving with IVF, trend BMP   Normocytic anemia -Drop in hemoglobin likely dilutional, Hgb remained stable   PAD  -With recently RLE revascularization with IR  -Continue aspirin, Plavix  Essential hypertension -Medications on hold secondary to to hypotension  GERD -Continue PPI    DVT prophylaxis: lovenox Code Status: full Family Communication: wife at bedside Disposition Plan: pending improvement, Podiatry plan    Consultants:   Podiatry   ID   Procedures:   None   Antimicrobials:  Anti-infectives (From admission, onward)   Start     Dose/Rate Route Frequency Ordered Stop   09/11/17 1700  vancomycin (VANCOCIN) IVPB 750 mg/150 ml premix  Status:  Discontinued     750 mg 150 mL/hr over 60 Minutes Intravenous Every 48 hours 09/09/17 1754 09/11/17 0840   09/11/17 1400  piperacillin-tazobactam (ZOSYN) IVPB 3.375 g     3.375 g 12.5 mL/hr over 240 Minutes Intravenous Every 8 hours 09/11/17 0840  09/11/17 1000  vancomycin (VANCOCIN) IVPB 1000 mg/200 mL premix     1,000 mg 200 mL/hr over 60 Minutes Intravenous Every 24 hours 09/11/17 0840     09/10/17 1000  hydroxychloroquine (PLAQUENIL) tablet 200 mg     200 mg Oral Daily 09/09/17 2020     09/09/17 1700  piperacillin-tazobactam (ZOSYN) IVPB 2.25 g  Status:   Discontinued     2.25 g 100 mL/hr over 30 Minutes Intravenous Every 8 hours 09/09/17 1624 09/11/17 0840   09/09/17 1700  vancomycin (VANCOCIN) IVPB 750 mg/150 ml premix     750 mg 150 mL/hr over 60 Minutes Intravenous  Once 09/09/17 1624 09/09/17 1748       Subjective: Patient without new complaints today. Doing better.   Objective: Vitals:   09/11/17 1706 09/11/17 2053 09/12/17 0459 09/12/17 1000  BP: 114/66 123/61 127/73 125/63  Pulse: 90 65 63 90  Resp: 18 17 18 18   Temp: 98.6 F (37 C) 97.6 F (36.4 C) 98.4 F (36.9 C) 98.2 F (36.8 C)  TempSrc: Oral Oral Oral Oral  SpO2: 100% 100% 99% 100%  Weight:  66.7 kg (147 lb)    Height:        Intake/Output Summary (Last 24 hours) at 09/12/2017 1345 Last data filed at 09/12/2017 0900 Gross per 24 hour  Intake 3447.5 ml  Output 300 ml  Net 3147.5 ml   Filed Weights   09/09/17 1500 09/11/17 2053  Weight: 65.8 kg (145 lb) 66.7 kg (147 lb)    Examination:  General exam: Appears calm and comfortable  Respiratory system: Clear to auscultation. Respiratory effort normal. Cardiovascular system: S1 & S2 heard, RRR. No JVD, murmurs, rubs, gallops or clicks. No pedal edema. Gastrointestinal system: Abdomen is nondistended, soft and nontender. No organomegaly or masses felt. Normal bowel sounds heard. Central nervous system: Alert and oriented. No focal neurological deficits. Extremities: +Right foot s/p transmetatarsal amputation  Skin: +Right foot with clean dry dressing  Psychiatry: Judgement and insight appear normal. Mood & affect appropriate.   Data Reviewed: I have personally reviewed following labs and imaging studies  CBC: Recent Labs  Lab 09/09/17 1503 09/10/17 0508 09/11/17 0658 09/12/17 0535  WBC 11.3* 6.6 6.8 6.1  NEUTROABS 7.2  --   --   --   HGB 10.9* 9.1* 9.3* 9.1*  HCT 32.2* 27.8* 28.2* 27.7*  MCV 85.2 84.8 86.0 86.3  PLT 337 232 233 829   Basic Metabolic Panel: Recent Labs  Lab 09/09/17 1503  09/10/17 0508 09/11/17 0658 09/12/17 0535  NA 128* 131* 134* 135  K 3.4* 3.1* 3.8 3.5  CL 98* 100* 101 104  CO2 19* 21* 21* 22  GLUCOSE 154* 94 91 85  BUN 33* 26* 16 11  CREATININE 2.69* 2.01* 1.43* 1.31*  CALCIUM 9.4 8.6* 8.8* 8.7*   GFR: Estimated Creatinine Clearance: 41 mL/min (A) (by C-G formula based on SCr of 1.31 mg/dL (H)). Liver Function Tests: Recent Labs  Lab 09/09/17 1503  AST 23  ALT 21  ALKPHOS 51  BILITOT 0.8  PROT 6.4*  ALBUMIN 3.5   No results for input(s): LIPASE, AMYLASE in the last 168 hours. No results for input(s): AMMONIA in the last 168 hours. Coagulation Profile: Recent Labs  Lab 09/09/17 1503  INR 1.10   Cardiac Enzymes: No results for input(s): CKTOTAL, CKMB, CKMBINDEX, TROPONINI in the last 168 hours. BNP (last 3 results) No results for input(s): PROBNP in the last 8760 hours. HbA1C: No results for input(s): HGBA1C  in the last 72 hours. CBG: No results for input(s): GLUCAP in the last 168 hours. Lipid Profile: No results for input(s): CHOL, HDL, LDLCALC, TRIG, CHOLHDL, LDLDIRECT in the last 72 hours. Thyroid Function Tests: No results for input(s): TSH, T4TOTAL, FREET4, T3FREE, THYROIDAB in the last 72 hours. Anemia Panel: No results for input(s): VITAMINB12, FOLATE, FERRITIN, TIBC, IRON, RETICCTPCT in the last 72 hours. Sepsis Labs: Recent Labs  Lab 09/09/17 1508 09/09/17 1750  LATICACIDVEN 2.38* 1.65    Recent Results (from the past 240 hour(s))  Culture, blood (Routine x 2)     Status: None (Preliminary result)   Collection Time: 09/09/17  3:16 PM  Result Value Ref Range Status   Specimen Description BLOOD RIGHT ANTECUBITAL  Final   Special Requests   Final    BOTTLES DRAWN AEROBIC AND ANAEROBIC Blood Culture adequate volume   Culture NO GROWTH 3 DAYS  Final   Report Status PENDING  Incomplete  Culture, blood (Routine x 2)     Status: None (Preliminary result)   Collection Time: 09/09/17  3:29 PM  Result Value Ref  Range Status   Specimen Description BLOOD LEFT ANTECUBITAL  Final   Special Requests   Final    BOTTLES DRAWN AEROBIC AND ANAEROBIC Blood Culture adequate volume   Culture NO GROWTH 3 DAYS  Final   Report Status PENDING  Incomplete  Urine Culture     Status: Abnormal   Collection Time: 09/09/17  5:26 PM  Result Value Ref Range Status   Specimen Description URINE, RANDOM  Final   Special Requests NONE  Final   Culture <10,000 COLONIES/mL INSIGNIFICANT GROWTH (A)  Final   Report Status 09/10/2017 FINAL  Final  Aerobic Culture (superficial specimen)     Status: Abnormal   Collection Time: 09/09/17  8:18 PM  Result Value Ref Range Status   Specimen Description WOUND FOOT  Final   Special Requests AMPUTATION  Final   Gram Stain   Final    FEW WBC PRESENT, PREDOMINANTLY PMN RARE GRAM POSITIVE RODS    Culture MULTIPLE ORGANISMS PRESENT, NONE PREDOMINANT (A)  Final   Report Status 09/12/2017 FINAL  Final       Radiology Studies: Dg Foot Complete Right  Result Date: 09/11/2017 CLINICAL DATA:  Right foot pain and swelling. EXAM: RIGHT FOOT COMPLETE - 3+ VIEW COMPARISON:  Radiographs of September 02, 2017. FINDINGS: Vascular calcifications are noted. Status post surgical amputation of all 5 distal metatarsals and phalanges. There appears to be a large ulceration overlying distal portion of residual fifth metatarsal with possible lytic defects seen in the distal portion of residual fifth metatarsal. This may represent osteomyelitis. IMPRESSION: Status post surgical amputation of all 5 distal metatarsals and phalanges. Probable large soft tissue ulceration is seen overlying distal portion of residual fifth metatarsal with possible lytic destruction suggesting osteomyelitis. MRI may be performed for further evaluation. Electronically Signed   By: Marijo Conception, M.D.   On: 09/11/2017 10:44      Scheduled Meds: . amitriptyline  25 mg Oral Daily  . aspirin EC  81 mg Oral QHS  . clopidogrel   75 mg Oral Daily  . enoxaparin (LOVENOX) injection  40 mg Subcutaneous Q24H  . finasteride  5 mg Oral Daily  . hydroxychloroquine  200 mg Oral Daily  . multivitamin with minerals  1 tablet Oral Daily  . pantoprazole  40 mg Oral BID  . predniSONE  5 mg Oral Q breakfast  . vitamin B-12  1,000 mcg Oral Daily   Continuous Infusions: . sodium chloride 75 mL/hr at 09/11/17 1330  . piperacillin-tazobactam (ZOSYN)  IV Stopped (09/12/17 1021)  . vancomycin Stopped (09/12/17 1116)     LOS: 1 day    Time spent: 30 minutes   Dessa Phi, DO [facility] [email or web address] Password [email or web address] 09/12/2017, 1:45 PM

## 2017-09-13 ENCOUNTER — Encounter (HOSPITAL_COMMUNITY)
Admission: EM | Disposition: A | Discharge status: Home Health | Payer: Self-pay | Source: Home / Self Care | Attending: Internal Medicine

## 2017-09-13 ENCOUNTER — Encounter (HOSPITAL_COMMUNITY): Payer: Self-pay | Admitting: Certified Registered"

## 2017-09-13 ENCOUNTER — Inpatient Hospital Stay (HOSPITAL_COMMUNITY): Payer: Medicare Other | Admitting: Anesthesiology

## 2017-09-13 ENCOUNTER — Telehealth: Payer: Self-pay | Admitting: *Deleted

## 2017-09-13 ENCOUNTER — Inpatient Hospital Stay (HOSPITAL_COMMUNITY): Payer: Medicare Other

## 2017-09-13 DIAGNOSIS — E11621 Type 2 diabetes mellitus with foot ulcer: Secondary | ICD-10-CM

## 2017-09-13 DIAGNOSIS — M86679 Other chronic osteomyelitis, unspecified ankle and foot: Secondary | ICD-10-CM

## 2017-09-13 HISTORY — PX: I & D EXTREMITY: SHX5045

## 2017-09-13 LAB — FUNGUS CULTURE WITH STAIN

## 2017-09-13 LAB — FUNGUS CULTURE RESULT

## 2017-09-13 LAB — BASIC METABOLIC PANEL
ANION GAP: 7 (ref 5–15)
BUN: 8 mg/dL (ref 6–20)
CHLORIDE: 106 mmol/L (ref 101–111)
CO2: 24 mmol/L (ref 22–32)
Calcium: 8.8 mg/dL — ABNORMAL LOW (ref 8.9–10.3)
Creatinine, Ser: 1.33 mg/dL — ABNORMAL HIGH (ref 0.61–1.24)
GFR calc non Af Amer: 48 mL/min — ABNORMAL LOW (ref >60)
GFR, EST AFRICAN AMERICAN: 56 mL/min — AB (ref >60)
Glucose, Bld: 94 mg/dL (ref 65–99)
POTASSIUM: 3.5 mmol/L (ref 3.5–5.1)
Sodium: 137 mmol/L (ref 135–145)

## 2017-09-13 LAB — CBC
HCT: 28.1 % — ABNORMAL LOW (ref 39.0–52.0)
HEMOGLOBIN: 9.2 g/dL — AB (ref 13.0–17.0)
MCH: 28.1 pg (ref 26.0–34.0)
MCHC: 32.7 g/dL (ref 30.0–36.0)
MCV: 85.9 fL (ref 78.0–100.0)
Platelets: 225 10*3/uL (ref 150–400)
RBC: 3.27 MIL/uL — AB (ref 4.22–5.81)
RDW: 15.1 % (ref 11.5–15.5)
WBC: 7.1 10*3/uL (ref 4.0–10.5)

## 2017-09-13 LAB — CK: Total CK: 45 U/L — ABNORMAL LOW (ref 49–397)

## 2017-09-13 LAB — FUNGAL ORGANISM REFLEX

## 2017-09-13 SURGERY — IRRIGATION AND DEBRIDEMENT EXTREMITY
Anesthesia: General | Site: Foot | Laterality: Right

## 2017-09-13 MED ORDER — VANCOMYCIN HCL 1000 MG IV SOLR
INTRAVENOUS | Status: AC
  Filled 2017-09-13: qty 1000

## 2017-09-13 MED ORDER — FENTANYL CITRATE (PF) 100 MCG/2ML IJ SOLN
INTRAMUSCULAR | Status: AC
  Administered 2017-09-13: 25 ug via INTRAVENOUS
  Filled 2017-09-13: qty 2

## 2017-09-13 MED ORDER — DEXAMETHASONE SODIUM PHOSPHATE 10 MG/ML IJ SOLN
INTRAMUSCULAR | Status: DC | PRN
  Administered 2017-09-13: 5 mg via INTRAVENOUS

## 2017-09-13 MED ORDER — MIDAZOLAM HCL 2 MG/2ML IJ SOLN
INTRAMUSCULAR | Status: AC
  Filled 2017-09-13: qty 2

## 2017-09-13 MED ORDER — SODIUM CHLORIDE 0.9% FLUSH: 10.0000 mL | INTRAVENOUS | Status: DC | PRN

## 2017-09-13 MED ORDER — ENOXAPARIN SODIUM 40 MG/0.4ML ~~LOC~~ SOLN
40.0000 mg | SUBCUTANEOUS | Status: DC
  Administered 2017-09-14 – 2017-09-16 (×3): 40 mg via SUBCUTANEOUS
  Filled 2017-09-13 (×3): qty 0.4

## 2017-09-13 MED ORDER — LIDOCAINE HCL (CARDIAC) 20 MG/ML IV SOLN
INTRAVENOUS | Status: DC | PRN
  Administered 2017-09-13: 40 mg via INTRAVENOUS

## 2017-09-13 MED ORDER — BUPIVACAINE HCL (PF) 0.5 % IJ SOLN
INTRAMUSCULAR | Status: AC
  Filled 2017-09-13: qty 30

## 2017-09-13 MED ORDER — FENTANYL CITRATE (PF) 100 MCG/2ML IJ SOLN
25.0000 ug | INTRAMUSCULAR | Status: DC | PRN
  Administered 2017-09-13 (×4): 25 ug via INTRAVENOUS

## 2017-09-13 MED ORDER — PHENYLEPHRINE HCL 10 MG/ML IJ SOLN
INTRAMUSCULAR | Status: DC | PRN
  Administered 2017-09-13: 120 ug via INTRAVENOUS
  Administered 2017-09-13: 80 ug via INTRAVENOUS
  Administered 2017-09-13: 120 ug via INTRAVENOUS

## 2017-09-13 MED ORDER — ONDANSETRON HCL 4 MG/2ML IJ SOLN
INTRAMUSCULAR | Status: DC | PRN
  Administered 2017-09-13: 4 mg via INTRAVENOUS

## 2017-09-13 MED ORDER — FENTANYL CITRATE (PF) 250 MCG/5ML IJ SOLN
INTRAMUSCULAR | Status: DC | PRN
  Administered 2017-09-13 (×3): 25 ug via INTRAVENOUS

## 2017-09-13 MED ORDER — FENTANYL CITRATE (PF) 250 MCG/5ML IJ SOLN
INTRAMUSCULAR | Status: AC
  Filled 2017-09-13: qty 5

## 2017-09-13 MED ORDER — PROPOFOL 10 MG/ML IV BOLUS
INTRAVENOUS | Status: DC | PRN
  Administered 2017-09-13: 130 mg via INTRAVENOUS

## 2017-09-13 MED ORDER — BUPIVACAINE HCL (PF) 0.5 % IJ SOLN
INTRAMUSCULAR | Status: DC | PRN
  Administered 2017-09-13: 20 mL

## 2017-09-13 SURGICAL SUPPLY — 44 items
BANDAGE ACE 4X5 VEL STRL LF (GAUZE/BANDAGES/DRESSINGS) IMPLANT
BANDAGE ELASTIC 4 VELCRO ST LF (GAUZE/BANDAGES/DRESSINGS) ×1 IMPLANT
BLADE LONG MED 31X9 (MISCELLANEOUS) ×1 IMPLANT
BNDG CMPR 9X4 STRL LF SNTH (GAUZE/BANDAGES/DRESSINGS)
BNDG ESMARK 4X9 LF (GAUZE/BANDAGES/DRESSINGS) IMPLANT
BNDG GAUZE ELAST 4 BULKY (GAUZE/BANDAGES/DRESSINGS) IMPLANT
CANISTER WOUND CARE 500ML ATS (WOUND CARE) ×1 IMPLANT
CHLORAPREP W/TINT 26ML (MISCELLANEOUS) ×2 IMPLANT
CONT SPEC 4OZ CLIKSEAL STRL BL (MISCELLANEOUS) ×5 IMPLANT
COVER SURGICAL LIGHT HANDLE (MISCELLANEOUS) ×2 IMPLANT
CUFF TOURNIQUET SINGLE 18IN (TOURNIQUET CUFF) IMPLANT
CUFF TOURNIQUET SINGLE 34IN LL (TOURNIQUET CUFF) IMPLANT
DRAPE U-SHAPE 47X51 STRL (DRAPES) ×2 IMPLANT
ELECT CAUTERY BLADE 6.4 (BLADE) ×2 IMPLANT
ELECT REM PT RETURN 9FT ADLT (ELECTROSURGICAL) ×2
ELECTRODE REM PT RTRN 9FT ADLT (ELECTROSURGICAL) ×1 IMPLANT
GAUZE SPONGE 4X4 12PLY STRL (GAUZE/BANDAGES/DRESSINGS) IMPLANT
GLOVE BIO SURGEON STRL SZ7.5 (GLOVE) ×2 IMPLANT
GLOVE BIOGEL PI IND STRL 8 (GLOVE) ×1 IMPLANT
GLOVE BIOGEL PI INDICATOR 8 (GLOVE) ×1
GOWN STRL REUS W/ TWL LRG LVL3 (GOWN DISPOSABLE) ×1 IMPLANT
GOWN STRL REUS W/ TWL XL LVL3 (GOWN DISPOSABLE) ×1 IMPLANT
GOWN STRL REUS W/TWL LRG LVL3 (GOWN DISPOSABLE) ×2
GOWN STRL REUS W/TWL XL LVL3 (GOWN DISPOSABLE) ×2
KIT BASIN OR (CUSTOM PROCEDURE TRAY) ×2 IMPLANT
KIT DRSG PREVENA PLUS 7DAY 125 (MISCELLANEOUS) ×1 IMPLANT
KIT PREVENA INCISION MGT 13 (CANNISTER) ×1 IMPLANT
KIT ROOM TURNOVER OR (KITS) ×2 IMPLANT
MANIFOLD NEPTUNE II (INSTRUMENTS) ×2 IMPLANT
NDL HYPO 25GX1X1/2 BEV (NEEDLE) IMPLANT
NEEDLE HYPO 25GX1X1/2 BEV (NEEDLE) IMPLANT
NS IRRIG 1000ML POUR BTL (IV SOLUTION) ×2 IMPLANT
PACK ORTHO EXTREMITY (CUSTOM PROCEDURE TRAY) ×2 IMPLANT
PAD ARMBOARD 7.5X6 YLW CONV (MISCELLANEOUS) ×4 IMPLANT
PADDING CAST SYNTHETIC 4 (CAST SUPPLIES) ×1
PADDING CAST SYNTHETIC 4X4 STR (CAST SUPPLIES) IMPLANT
SCRUB BETADINE 4OZ XXX (MISCELLANEOUS) ×2 IMPLANT
SET CYSTO W/LG BORE CLAMP LF (SET/KITS/TRAYS/PACK) ×2 IMPLANT
SOL PREP POV-IOD 4OZ 10% (MISCELLANEOUS) ×2 IMPLANT
SYR CONTROL 10ML LL (SYRINGE) IMPLANT
TOWEL GREEN STERILE (TOWEL DISPOSABLE) ×2 IMPLANT
TOWEL GREEN STERILE FF (TOWEL DISPOSABLE) ×2 IMPLANT
TUBE CONNECTING 12X1/4 (SUCTIONS) ×2 IMPLANT
YANKAUER SUCT BULB TIP NO VENT (SUCTIONS) ×2 IMPLANT

## 2017-09-13 NOTE — Progress Notes (Signed)
PROGRESS NOTE    Derrick [name]  ZOX:[id number] DOB: Jul 15, 1932 DOA: 09/09/2017 PCP: [name], MD     Brief Narrative:  Derrick [name] a 83 y.o.malewith hx of COPD, mild CKD, HL, HTN, prostate cancer, hx RCCa sp nephrectomy presented to ED w/ recurrent syncope. In ED, BP was in the 70's, BP's improved after NS bolus to 90's-100's and pt feeling better. Patient was admitted due to syncope and AKI.   In August pt had paronychia of the R great toe which was complicated by infection then gangrene. He underwent angiogram right legAug 23rd by IR Dr [name] w/ balloon PTA to the ant tibial artery. R PT artery couldn't be crossed due to calcification. He got worse and underwent R 1/2 toe amputation on 06/13/17 by Dr. [name]. He then had another IR procedure by Dr [name] on Nov 1st with PTA to prox AT and PT. They were unable to treat the distal tibial vessels/pedal vesselsdue to "dense coral-reef calcification". The foot progressed and he was admitted and underwent R TMA on 08/14/17, again by Dr [name]. He has had persistent pain in the R foot / TMA stump and was taking percocet but said itwasn't working so was changed to po dilaudid. He was started recently on oral abx, cipro and clindamycin and has been taking these.   Assessment & Plan:   Principal Problem:   Hypotension Active Problems:   Hypertension   CKD (chronic kidney disease), stage III (HCC)   Hyponatremia   COPD (chronic obstructive pulmonary disease) (HCC)   Malaise   Anorexia   Volume depletion   AKI (acute kidney injury) ([place])   Syncope   Syncope -Likely secondary to dehydration from nausea and poor oral intake secondary to possible wound infection -Repeat orthostatic VS improved. No further syncopal events. BP stable.    Right transmetarsal wound infection -Has been on several outpatient oral antibiotics and has failed (per pharmacy: doxycycline 9/17, keflex 11/7, clindamycin 11/12, augmentin 11/20,  bactrim 11/20, and cipro 11/30)  -Patient had osteomyelitis and gangrene of right toes with nonhealing surgical wounds secondary to PAD s/p transmetatarsal amputation November 2018 -Blood cultures are no growth to date -Wound culture with multiple organism  -Podiatry consulted and following  -Foot xray revealed large soft tissue ulceration overlying distal portion residual fifth metatarsal with possible lytic destruction suggesting osteomyelitis. Discussed with Dr. [name] today, planning for debridement this afternoon  -ID following, on dapto/zosyn. PICC in place.   Acute kidney injury on chronic kidney injury, stage III -Secondary to dehydration and hypotension -Stable now   Normocytic anemia -Drop in hemoglobin likely dilutional, Hgb remained stable   PAD  -With recently RLE revascularization with IR  -Continue aspirin, Plavix  Essential hypertension -Medications on hold secondary to to hypotension and syncope. BP stable off antihypertensive   GERD -Continue PPI    DVT prophylaxis: lovenox Code Status: full Family Communication: wife at bedside Disposition Plan: OR this afternoon    Consultants:   Podiatry   ID   Procedures:   None   Antimicrobials:  Anti-infectives (From admission, onward)   Start     Dose/Rate Route Frequency Ordered Stop   09/13/17 1000  DAPTOmycin (CUBICIN) 400 mg in sodium chloride 0.9 % IVPB     6 mg/kg  66.7 kg 216 mL/hr over 30 Minutes Intravenous Every 24 hours 09/12/17 1553     09/11/17 1700  vancomycin (VANCOCIN) IVPB 750 mg/150 ml premix  Status:  Discontinued  750 mg 150 mL/hr over 60 Minutes Intravenous Every 48 hours 09/09/17 1754 09/11/17 0840   09/11/17 1400  piperacillin-tazobactam (ZOSYN) IVPB 3.375 g     3.375 g 12.5 mL/hr over 240 Minutes Intravenous Every 8 hours 09/11/17 0840     09/11/17 1000  vancomycin (VANCOCIN) IVPB 1000 mg/200 mL premix  Status:  Discontinued     1,000 mg 200 mL/hr over 60 Minutes  Intravenous Every 24 hours 09/11/17 0840 09/12/17 1541   09/10/17 1000  hydroxychloroquine (PLAQUENIL) tablet 200 mg     200 mg Oral Daily 09/09/17 2020     09/09/17 1700  piperacillin-tazobactam (ZOSYN) IVPB 2.25 g  Status:  Discontinued     2.25 g 100 mL/hr over 30 Minutes Intravenous Every 8 hours 09/09/17 1624 09/11/17 0840   09/09/17 1700  vancomycin (VANCOCIN) IVPB 750 mg/150 ml premix     750 mg 150 mL/hr over 60 Minutes Intravenous  Once 09/09/17 1624 09/09/17 1748       Subjective: Patient without new complaints today. Doing better. No further syncopal episodes, no dizziness, lightheadedness. Ambulated to commode without issue   Objective: Vitals:   09/12/17 1746 09/12/17 2156 09/13/17 0316 09/13/17 0546  BP: 125/68 119/84  123/65  Pulse: 90 76  (!) 56  Resp: 18 18  18   Temp: 98.6 F (37 C) 98.2 F (36.8 C)  (!) 97.5 F (36.4 C)  TempSrc: Oral Oral  Oral  SpO2: 100% 100%  97%  Weight:  66.8 kg (147 lb 3.2 oz) 66.8 kg (147 lb 4.3 oz)   Height:        Intake/Output Summary (Last 24 hours) at 09/13/2017 1249 Last data filed at 09/13/2017 1041 Gross per 24 hour  Intake 2500 ml  Output 1025 ml  Net 1475 ml   Filed Weights   09/11/17 2053 09/12/17 2156 09/13/17 0316  Weight: 66.7 kg (147 lb) 66.8 kg (147 lb 3.2 oz) 66.8 kg (147 lb 4.3 oz)    Examination:  General exam: Appears calm and comfortable  Respiratory system: Clear to auscultation. Respiratory effort normal. Cardiovascular system: S1 & S2 heard, RRR. No JVD, murmurs, rubs, gallops or clicks. No pedal edema. Gastrointestinal system: Abdomen is nondistended, soft and nontender. No organomegaly or masses felt. Normal bowel sounds heard. Central nervous system: Alert and oriented. No focal neurological deficits. Extremities: +Right foot s/p transmetatarsal amputation  Skin: +Right foot with clean dry dressing  Psychiatry: Judgement and insight appear normal. Mood & affect appropriate.   Data Reviewed: I  have personally reviewed following labs and imaging studies  CBC: Recent Labs  Lab 09/09/17 1503 09/10/17 0508 09/11/17 0658 09/12/17 0535 09/13/17 0347  WBC 11.3* 6.6 6.8 6.1 7.1  NEUTROABS 7.2  --   --   --   --   HGB 10.9* 9.1* 9.3* 9.1* 9.2*  HCT 32.2* 27.8* 28.2* 27.7* 28.1*  MCV 85.2 84.8 86.0 86.3 85.9  PLT 337 232 233 211 119   Basic Metabolic Panel: Recent Labs  Lab 09/09/17 1503 09/10/17 0508 09/11/17 0658 09/12/17 0535 09/13/17 0347  NA 128* 131* 134* 135 137  K 3.4* 3.1* 3.8 3.5 3.5  CL 98* 100* 101 104 106  CO2 19* 21* 21* 22 24  GLUCOSE 154* 94 91 85 94  BUN 33* 26* 16 11 8   CREATININE 2.69* 2.01* 1.43* 1.31* 1.33*  CALCIUM 9.4 8.6* 8.8* 8.7* 8.8*   GFR: Estimated Creatinine Clearance: 40.5 mL/min (A) (by C-G formula based on SCr of 1.33 mg/dL (H)).  Liver Function Tests: Recent Labs  Lab 09/09/17 1503  AST 23  ALT 21  ALKPHOS 51  BILITOT 0.8  PROT 6.4*  ALBUMIN 3.5   No results for input(s): LIPASE, AMYLASE in the last 168 hours. No results for input(s): AMMONIA in the last 168 hours. Coagulation Profile: Recent Labs  Lab 09/09/17 1503  INR 1.10   Cardiac Enzymes: Recent Labs  Lab 09/12/17 1616 09/13/17 0347  CKTOTAL 47* 45*   BNP (last 3 results) No results for input(s): PROBNP in the last 8760 hours. HbA1C: No results for input(s): HGBA1C in the last 72 hours. CBG: No results for input(s): GLUCAP in the last 168 hours. Lipid Profile: No results for input(s): CHOL, HDL, LDLCALC, TRIG, CHOLHDL, LDLDIRECT in the last 72 hours. Thyroid Function Tests: No results for input(s): TSH, T4TOTAL, FREET4, T3FREE, THYROIDAB in the last 72 hours. Anemia Panel: No results for input(s): VITAMINB12, FOLATE, FERRITIN, TIBC, IRON, RETICCTPCT in the last 72 hours. Sepsis Labs: Recent Labs  Lab 09/09/17 1508 09/09/17 1750  LATICACIDVEN 2.38* 1.65    Recent Results (from the past 240 hour(s))  Culture, blood (Routine x 2)     Status: None  (Preliminary result)   Collection Time: 09/09/17  3:16 PM  Result Value Ref Range Status   Specimen Description BLOOD RIGHT ANTECUBITAL  Final   Special Requests   Final    BOTTLES DRAWN AEROBIC AND ANAEROBIC Blood Culture adequate volume   Culture NO GROWTH 3 DAYS  Final   Report Status PENDING  Incomplete  Culture, blood (Routine x 2)     Status: None (Preliminary result)   Collection Time: 09/09/17  3:29 PM  Result Value Ref Range Status   Specimen Description BLOOD LEFT ANTECUBITAL  Final   Special Requests   Final    BOTTLES DRAWN AEROBIC AND ANAEROBIC Blood Culture adequate volume   Culture NO GROWTH 3 DAYS  Final   Report Status PENDING  Incomplete  Urine Culture     Status: Abnormal   Collection Time: 09/09/17  5:26 PM  Result Value Ref Range Status   Specimen Description URINE, RANDOM  Final   Special Requests NONE  Final   Culture <10,000 COLONIES/mL INSIGNIFICANT GROWTH (A)  Final   Report Status 09/10/2017 FINAL  Final  Aerobic Culture (superficial specimen)     Status: Abnormal   Collection Time: 09/09/17  8:18 PM  Result Value Ref Range Status   Specimen Description WOUND FOOT  Final   Special Requests AMPUTATION  Final   Gram Stain   Final    FEW WBC PRESENT, PREDOMINANTLY PMN RARE GRAM POSITIVE RODS    Culture MULTIPLE ORGANISMS PRESENT, NONE PREDOMINANT (A)  Final   Report Status 09/12/2017 FINAL  Final       Radiology Studies: No results found.    Scheduled Meds: . amitriptyline  25 mg Oral Daily  . aspirin EC  81 mg Oral QHS  . clopidogrel  75 mg Oral Daily  . enoxaparin (LOVENOX) injection  40 mg Subcutaneous Q24H  . finasteride  5 mg Oral Daily  . hydroxychloroquine  200 mg Oral Daily  . multivitamin with minerals  1 tablet Oral Daily  . pantoprazole  40 mg Oral BID  . predniSONE  5 mg Oral Q breakfast  . vitamin B-12  1,000 mcg Oral Daily   Continuous Infusions: . sodium chloride 75 mL/hr at 09/11/17 1330  . DAPTOmycin (CUBICIN)  IV 400  mg (09/13/17 1147)  . piperacillin-tazobactam (ZOSYN)  IV Stopped (09/12/17 1945)     LOS: 2 days    Time spent: 30 minutes   Dessa Phi, DO [facility] [email or web address] Password [facility] [place] 09/13/2017, 12:49 PM

## 2017-09-13 NOTE — Telephone Encounter (Signed)
Per Dr. [name], I called and scheduled surgery for Derrick [name] for today at 4 pm at the [facility].  He's doing a Wound Debridement and Bone Biopsy right foot.

## 2017-09-13 NOTE — Transfer of Care (Signed)
Immediate Anesthesia Transfer of Care Note  Patient: Derrick [name]  Procedure(s) Performed: IRRIGATION AND DEBRIDEMENT AND BONE BIOPSY (Right Foot)  Patient Location: PACU  Anesthesia Type:General  Level of Consciousness: drowsy and patient cooperative  Airway & Oxygen Therapy: Patient Spontanous Breathing and Patient connected to nasal cannula oxygen  Post-op Assessment: Report given to RN and Post -op Vital signs reviewed and stable  Post vital signs: Reviewed and stable  Last Vitals:  Vitals:   09/13/17 0546 09/13/17 1000  BP: 123/65 118/76  Pulse: (!) 56 60  Resp: 18 18  Temp: (!) 36.4 C 36.7 C  SpO2: 97% 98%    Last Pain:  Vitals:   09/13/17 1439  TempSrc:   PainSc: 10-Worst pain ever         Complications: No apparent anesthesia complications

## 2017-09-13 NOTE — Progress Notes (Signed)
INFECTIOUS DISEASE PROGRESS NOTE  ID: Derrick [name] is a 83 y.o. male with  Principal Problem:   Hypotension Active Problems:   Hypertension   CKD (chronic kidney disease), stage III (HCC)   Hyponatremia   COPD (chronic obstructive pulmonary disease) (HCC)   Malaise   Anorexia   Volume depletion   AKI (acute kidney injury) ([place])   Syncope  Subjective: No complaints  Abtx:  Anti-infectives (From admission, onward)   Start     Dose/Rate Route Frequency Ordered Stop   09/13/17 1000  DAPTOmycin (CUBICIN) 400 mg in sodium chloride 0.9 % IVPB     6 mg/kg  66.7 kg 216 mL/hr over 30 Minutes Intravenous Every 24 hours 09/12/17 1553     09/11/17 1700  vancomycin (VANCOCIN) IVPB 750 mg/150 ml premix  Status:  Discontinued     750 mg 150 mL/hr over 60 Minutes Intravenous Every 48 hours 09/09/17 1754 09/11/17 0840   09/11/17 1400  piperacillin-tazobactam (ZOSYN) IVPB 3.375 g     3.375 g 12.5 mL/hr over 240 Minutes Intravenous Every 8 hours 09/11/17 0840     09/11/17 1000  vancomycin (VANCOCIN) IVPB 1000 mg/200 mL premix  Status:  Discontinued     1,000 mg 200 mL/hr over 60 Minutes Intravenous Every 24 hours 09/11/17 0840 09/12/17 1541   09/10/17 1000  hydroxychloroquine (PLAQUENIL) tablet 200 mg     200 mg Oral Daily 09/09/17 2020     09/09/17 1700  piperacillin-tazobactam (ZOSYN) IVPB 2.25 g  Status:  Discontinued     2.25 g 100 mL/hr over 30 Minutes Intravenous Every 8 hours 09/09/17 1624 09/11/17 0840   09/09/17 1700  vancomycin (VANCOCIN) IVPB 750 mg/150 ml premix     750 mg 150 mL/hr over 60 Minutes Intravenous  Once 09/09/17 1624 09/09/17 1748      Medications:  Scheduled: . amitriptyline  25 mg Oral Daily  . aspirin EC  81 mg Oral QHS  . clopidogrel  75 mg Oral Daily  . enoxaparin (LOVENOX) injection  40 mg Subcutaneous Q24H  . finasteride  5 mg Oral Daily  . hydroxychloroquine  200 mg Oral Daily  . multivitamin with minerals  1 tablet Oral Daily  . pantoprazole  40  mg Oral BID  . predniSONE  5 mg Oral Q breakfast  . vitamin B-12  1,000 mcg Oral Daily    Objective: Vital signs in last 24 hours: Temp:  [97.5 F (36.4 C)-98.6 F (37 C)] 97.5 F (36.4 C) (12/07 0546) Pulse Rate:  [56-90] 56 (12/07 0546) Resp:  [18] 18 (12/07 0546) BP: (119-125)/(65-84) 123/65 (12/07 0546) SpO2:  [97 %-100 %] 97 % (12/07 0546) Weight:  [66.8 kg (147 lb 3.2 oz)-66.8 kg (147 lb 4.3 oz)] 66.8 kg (147 lb 4.3 oz) (12/07 0316)   General appearance: alert, cooperative and no distress Resp: clear to auscultation bilaterally Cardio: irregularly irregular rhythm GI: normal findings: bowel sounds normal and soft, non-tender  Lab Results Recent Labs    09/12/17 0535 09/13/17 0347  WBC 6.1 7.1  HGB 9.1* 9.2*  HCT 27.7* 28.1*  NA 135 137  K 3.5 3.5  CL 104 106  CO2 22 24  BUN 11 8  CREATININE 1.31* 1.33*   Liver Panel No results for input(s): PROT, ALBUMIN, AST, ALT, ALKPHOS, BILITOT, BILIDIR, IBILI in the last 72 hours. Sedimentation Rate No results for input(s): ESRSEDRATE in the last 72 hours. C-Reactive Protein No results for input(s): CRP in the last 72 hours.  Microbiology: Recent Results (from the past 240 hour(s))  Culture, blood (Routine x 2)     Status: None (Preliminary result)   Collection Time: 09/09/17  3:16 PM  Result Value Ref Range Status   Specimen Description BLOOD RIGHT ANTECUBITAL  Final   Special Requests   Final    BOTTLES DRAWN AEROBIC AND ANAEROBIC Blood Culture adequate volume   Culture NO GROWTH 3 DAYS  Final   Report Status PENDING  Incomplete  Culture, blood (Routine x 2)     Status: None (Preliminary result)   Collection Time: 09/09/17  3:29 PM  Result Value Ref Range Status   Specimen Description BLOOD LEFT ANTECUBITAL  Final   Special Requests   Final    BOTTLES DRAWN AEROBIC AND ANAEROBIC Blood Culture adequate volume   Culture NO GROWTH 3 DAYS  Final   Report Status PENDING  Incomplete  Urine Culture     Status:  Abnormal   Collection Time: 09/09/17  5:26 PM  Result Value Ref Range Status   Specimen Description URINE, RANDOM  Final   Special Requests NONE  Final   Culture <10,000 COLONIES/mL INSIGNIFICANT GROWTH (A)  Final   Report Status 09/10/2017 FINAL  Final  Aerobic Culture (superficial specimen)     Status: Abnormal   Collection Time: 09/09/17  8:18 PM  Result Value Ref Range Status   Specimen Description WOUND FOOT  Final   Special Requests AMPUTATION  Final   Gram Stain   Final    FEW WBC PRESENT, PREDOMINANTLY PMN RARE GRAM POSITIVE RODS    Culture MULTIPLE ORGANISMS PRESENT, NONE PREDOMINANT (A)  Final   Report Status 09/12/2017 FINAL  Final    Studies/Results: Dg Foot Complete Right  Result Date: 09/11/2017 CLINICAL DATA:  Right foot pain and swelling. EXAM: RIGHT FOOT COMPLETE - 3+ VIEW COMPARISON:  Radiographs of September 02, 2017. FINDINGS: Vascular calcifications are noted. Status post surgical amputation of all 5 distal metatarsals and phalanges. There appears to be a large ulceration overlying distal portion of residual fifth metatarsal with possible lytic defects seen in the distal portion of residual fifth metatarsal. This may represent osteomyelitis. IMPRESSION: Status post surgical amputation of all 5 distal metatarsals and phalanges. Probable large soft tissue ulceration is seen overlying distal portion of residual fifth metatarsal with possible lytic destruction suggesting osteomyelitis. MRI may be performed for further evaluation. Electronically Signed   By: Marijo Conception, M.D.   On: 09/11/2017 10:44     Assessment/Plan: Osteomyelitis R foot PVD CKD  Total days of antibiotics: 4 dapto/zosyn       Surgical re-eval today Appreciate WOC eval.  Has PIC now Cr improved BCx 12-3 are ngtd CK normal (will need to monitored with dapto) He will need long term anbx unless he has further amputation.  His PVD will make healing difficult.   ID available as needed over  the weekend.     Bobby Rumpf MD, FACP Infectious Diseases (pager) [phone] [email or web address] 09/13/2017, 10:04 AM  LOS: 2 days

## 2017-09-13 NOTE — Op Note (Signed)
Patient Name: SAXON BARICH DOB: 08/20/1932  MRN: [id number]  Date of Service: 09/13/17  Surgeon: Dr. [name], DPM Assistants: None Pre-operative Diagnosis: Osteomyeltis R 5th Metatarsal Post-operative Diagnosis: same Procedures:             1) Revision Transmetatarsal Amputation RLE  2) Application of Wound VAC RLE. Pathology/Specimens:              1. Deep Soft Tissue Central R             2. Deep Soft Tissue Lateral R             3. 3rd Metatarsal Bone R             4. 5th Metatarsal Bone R `           5. 1st Metatarsal Bone R Anesthesia: MAC with Local Hemostasis: Anatomic Estimated Blood Loss: 52ms Materials: None Medications: Marcaine 0.5% plain, 1g Vancomycin powder Complications: None  Indications for Procedure: This is a 83y.o. male with a history of RLE TMA. Recent XR show concern for  Procedure in Detail: Patient was identified in pre-operative holding area. Formal consent was signed and the right lower extremity was marked. Patient was brought back to the operating room and placed on the operating room table in the supine position. Anesthesia was induced. The right lower extremity was prepped and draped in the usual sterile fashion. Timeout was taken to confirm patient name, laterality, and procedure prior to incision. Attention was then directed to the right lower extremity.  Attention was directed to the lateral aspect of the foot about the area of the fifth metatarsal.  There was fibrotic wound base with immediate exposure of the fifth metatarsal underneath.  This was sharply debrided with a 15 blade. Similarly there was a dehiscence at the central aspect of the incision with exposed third metatarsal bone.  The areas were thoroughly irrigated with normal saline to remove surface contaminants and a rondure was used to take deep soft tissue cultures of the central and lateral aspects of the foot.   The exposed aspect of the fifth metatarsal was excised with a bone  cutting forcep and sent for microbiology.  A similar area of the third metatarsal that was exposed was excised with a bone cutting forcep and sent for microbiology.  Because of the shortening of these metatarsals it was deemed appropriate to restore the metatarsal parabola by revising all of the metatarsal lengths. Additional bone cuts were completed with a sagittal saw and rasped smooth.  The area was then thoroughly irrigated with normal saline.  1 g of Vanco mycin powder was packed in the wound  The skin was then closed with 3-0 nylon and skin staples. A Prevena wound VAC was then applied and set to 125 mmHg continuous. The foot was then dressed with cast padding ACE bandage. Patient tolerated the procedure well.  Disposition: Following a period of post-operative monitoring, patient will be transferred back to the floor.  Cultures will be followed for antibiotic implications. Patient will follow-up in the office next Tuesday.

## 2017-09-13 NOTE — Progress Notes (Signed)
Peripherally Inserted Central Catheter/Midline Placement  The IV Nurse has discussed with the patient and/or persons authorized to consent for the patient, the purpose of this procedure and the potential benefits and risks involved with this procedure.  The benefits include less needle sticks, lab draws from the catheter, and the patient may be discharged home with the catheter. Risks include, but not limited to, infection, bleeding, blood clot (thrombus formation), and puncture of an artery; nerve damage and irregular heartbeat and possibility to perform a PICC exchange if needed/ordered by physician.  Alternatives to this procedure were also discussed.  Bard Power PICC patient education guide, fact sheet on infection prevention and patient information card has been provided to patient /or left at bedside.    PICC/Midline Placement Documentation  PICC Single Lumen 09/13/17 PICC Right Brachial 42 cm 0 cm (Active)  Indication for Insertion or Continuance of Line Home intravenous therapies (PICC only) 09/13/2017  8:46 AM  Exposed Catheter (cm) 0 cm 09/13/2017  8:46 AM  Site Assessment Clean;Dry;Intact 09/13/2017  8:46 AM  Line Status Flushed;Saline locked;Blood return noted 09/13/2017  8:46 AM  Dressing Type Transparent;Securing device 09/13/2017  8:46 AM  Dressing Status Clean;Dry;Intact;Antimicrobial disc in place 09/13/2017  8:46 AM  Dressing Change Due 09/20/17 09/13/2017  8:46 AM       Derrick [name] 09/13/2017, 8:48 AM

## 2017-09-13 NOTE — Brief Op Note (Signed)
09/13/2017  6:08 PM  PATIENT:  Derrick [name]  83 y.o. male  PRE-OPERATIVE DIAGNOSIS:  wound infection right foot   POST-OPERATIVE DIAGNOSIS:  wound infection right foot   PROCEDURE:  Procedure(s): IRRIGATION AND DEBRIDEMENT AND BONE BIOPSY (Right)  SURGEON:  Surgeon(s) and Role:    [name], DPM - Primary  PHYSICIAN ASSISTANT:   ASSISTANTS: none   ANESTHESIA:   MAC  EBL:  30 mL   BLOOD ADMINISTERED:none  DRAINS: none   LOCAL MEDICATIONS USED:  MARCAINE  20 mls  SPECIMEN:  Source of Specimen:  soft tissue and bone R foot   1. Deep Soft Tissue Central  2. Deep Soft Tissue Lateral  3. 3rd Metatarsal Bone  4. 5th Metatarsal Bone ` 5. 1st Metatarsal Bone  DISPOSITION OF SPECIMEN:  micro  COUNTS:  YES  TOURNIQUET:  * No tourniquets in log *  DICTATION: .Note written in EPIC  PLAN OF CARE: Transfer to floor  PATIENT DISPOSITION:  PACU - hemodynamically stable.   Delay start of Pharmacological VTE agent (>24hrs) due to surgical blood loss or risk of bleeding: no

## 2017-09-13 NOTE — Anesthesia Procedure Notes (Signed)
Procedure Name: LMA Insertion Date/Time: 09/13/2017 5:13 PM Performed by: Imagene Riches, CRNA Pre-anesthesia Checklist: Patient identified, Emergency Drugs available, Suction available and Patient being monitored Patient Re-evaluated:Patient Re-evaluated prior to induction Oxygen Delivery Method: Circle System Utilized Preoxygenation: Pre-oxygenation with 100% oxygen Induction Type: IV induction Ventilation: Mask ventilation without difficulty LMA: LMA inserted LMA Size: 4.0 Number of attempts: 1 Airway Equipment and Method: Bite block Placement Confirmation: positive ETCO2 Tube secured with: Tape Dental Injury: Teeth and Oropharynx as per pre-operative assessment

## 2017-09-13 NOTE — Anesthesia Preprocedure Evaluation (Signed)
Anesthesia Evaluation  Patient identified by MRN, date of birth, ID band Patient awake    Reviewed: Allergy & Precautions, H&P , NPO status , Patient's Chart, lab work & pertinent test results  Airway Mallampati: II   Neck ROM: full    Dental   Pulmonary shortness of breath, COPD, former smoker,    breath sounds clear to auscultation       Cardiovascular hypertension, + angina + CAD   Rhythm:regular Rate:Normal  ECG: A-fib, rate 99   Neuro/Psych PSYCHIATRIC DISORDERS Anxiety Depression  Neuromuscular disease    GI/Hepatic hiatal hernia, GERD  ,  Endo/Other    Renal/GU Renal InsufficiencyRenal disease     Musculoskeletal  (+) Arthritis ,   Abdominal   Peds  Hematology  (+) anemia ,   Anesthesia Other Findings wound infection High cholesterol  Reproductive/Obstetrics                             Anesthesia Physical  Anesthesia Plan  ASA: III  Anesthesia Plan: General   Post-op Pain Management:    Induction: Intravenous  PONV Risk Score and Plan: 2 and Treatment may vary due to age or medical condition  Airway Management Planned: LMA  Additional Equipment:   Intra-op Plan:   Post-operative Plan: Extubation in OR  Informed Consent: I have reviewed the patients History and Physical, chart, labs and discussed the procedure including the risks, benefits and alternatives for the proposed anesthesia with the patient or authorized representative who has indicated his/her understanding and acceptance.   Dental advisory given  Plan Discussed with: CRNA  Anesthesia Plan Comments:         Anesthesia Quick Evaluation

## 2017-09-13 NOTE — Consult Note (Signed)
[place] Nurse wound consult note Reason for Consult: dehisced surgical site Wound type: transmet amputation site from 08/14/17, Dr. [name] Pressure Injury POA: NA Measurement: 0.1cm x 15cm x centrally 2.5cm depth x lateral 2.0cm under eschar with exposed bone Wound bed: ruddy, dark bloody  Drainage (amount, consistency, odor) thick, bloody, no odor Periwound:mild edema, redness, palpable pulses  Dressing procedure/placement/frequency: [place] wrote for topical dressing; non adherent but feel that will need further intervention from surgery.   Discussed wound status with Dr. [name], she reports Dr. [name] will see patient today.  Discussed POC with patient and bedside nurse.  Re consult if needed, will not follow at this time. Thanks  Annabeth Tortora [employer], RN,CWOCN, CNS, [place] [phone])

## 2017-09-13 NOTE — Progress Notes (Signed)
  Subjective:  Patient ID: Derrick [name], male    DOB: December 01, 1931,  MRN: [id number]  Patient seen at bedside. Resting comfortably with wife at bedside. Pain controlled to RLE. No complaints. Patient last ate bacon and eggs around 9am this morning. Objective:   Vitals:   09/12/17 2156 09/13/17 0546  BP: 119/84 123/65  Pulse: 76 (!) 56  Resp: 18 18  Temp: 98.2 F (36.8 C) (!) 97.5 F (36.4 C)  SpO2: 100% 97%   General AA&O x3. Normal mood and affect.  Vascular R foot warm.  Neurologic Epicritic sensation grossly intact.  Dermatologic R foot amputation stump with small central dehiscence, fibrotic dehiscence laterally about the 5th metatarsal. No expressible purulence. No erythema. Small amount of serosanguinous drainage. No signs of acute infection.  Orthopedic: Pain to palpation about the surgical site.   Results for orders placed or performed during the hospital encounter of 09/09/17 (from the past 24 hour(s))  CK     Status: Abnormal   Collection Time: 09/12/17  4:16 PM  Result Value Ref Range   Total CK 47 (L) 49 - 397 U/L  Basic metabolic panel     Status: Abnormal   Collection Time: 09/13/17  3:47 AM  Result Value Ref Range   Sodium 137 135 - 145 mmol/L   Potassium 3.5 3.5 - 5.1 mmol/L   Chloride 106 101 - 111 mmol/L   CO2 24 22 - 32 mmol/L   Glucose, Bld 94 65 - 99 mg/dL   BUN 8 6 - 20 mg/dL   Creatinine, Ser 1.33 (H) 0.61 - 1.24 mg/dL   Calcium 8.8 (L) 8.9 - 10.3 mg/dL   GFR calc non Af Amer 48 (L) >60 mL/min   GFR calc Af Amer 56 (L) >60 mL/min   Anion gap 7 5 - 15  CBC     Status: Abnormal   Collection Time: 09/13/17  3:47 AM  Result Value Ref Range   WBC 7.1 4.0 - 10.5 K/uL   RBC 3.27 (L) 4.22 - 5.81 MIL/uL   Hemoglobin 9.2 (L) 13.0 - 17.0 g/dL   HCT 28.1 (L) 39.0 - 52.0 %   MCV 85.9 78.0 - 100.0 fL   MCH 28.1 26.0 - 34.0 pg   MCHC 32.7 30.0 - 36.0 g/dL   RDW 15.1 11.5 - 15.5 %   Platelets 225 150 - 400 K/uL  CK     Status: Abnormal   Collection Time:  09/13/17  3:47 AM  Result Value Ref Range   Total CK 45 (L) 49 - 397 U/L   Assessment & Plan:  Patient was evaluated and treated and all questions answered.  R Foot s/p TMA, ?OM R 5th Metatarsal -XR reviewed. -Labs reviewed. -Discussed plan of care with patient, wife, and patient's nurse. -Will plan for debridement and bone biopsy today for tailoring of Abx therapy. Patient has PICC. Plan per ID is for long-term abx. -Continue NPO status.   Total floor time: 15 minutes.  [name], DPM

## 2017-09-14 LAB — CBC
HEMATOCRIT: 28.5 % — AB (ref 39.0–52.0)
Hemoglobin: 9.5 g/dL — ABNORMAL LOW (ref 13.0–17.0)
MCH: 29.1 pg (ref 26.0–34.0)
MCHC: 33.3 g/dL (ref 30.0–36.0)
MCV: 87.4 fL (ref 78.0–100.0)
PLATELETS: 230 10*3/uL (ref 150–400)
RBC: 3.26 MIL/uL — AB (ref 4.22–5.81)
RDW: 15.5 % (ref 11.5–15.5)
WBC: 8.2 10*3/uL (ref 4.0–10.5)

## 2017-09-14 LAB — CULTURE, BLOOD (ROUTINE X 2)
CULTURE: NO GROWTH
Culture: NO GROWTH
SPECIAL REQUESTS: ADEQUATE
Special Requests: ADEQUATE

## 2017-09-14 LAB — BASIC METABOLIC PANEL
Anion gap: 11 (ref 5–15)
BUN: 8 mg/dL (ref 6–20)
CHLORIDE: 105 mmol/L (ref 101–111)
CO2: 21 mmol/L — ABNORMAL LOW (ref 22–32)
Calcium: 8.9 mg/dL (ref 8.9–10.3)
Creatinine, Ser: 1.16 mg/dL (ref 0.61–1.24)
GFR, EST NON AFRICAN AMERICAN: 57 mL/min — AB (ref >60)
Glucose, Bld: 116 mg/dL — ABNORMAL HIGH (ref 65–99)
POTASSIUM: 3.7 mmol/L (ref 3.5–5.1)
SODIUM: 137 mmol/L (ref 135–145)

## 2017-09-14 NOTE — Anesthesia Postprocedure Evaluation (Signed)
Anesthesia Post Note  Patient: Derrick [name]  Procedure(s) Performed: IRRIGATION AND DEBRIDEMENT AND BONE BIOPSY (Right Foot)     Patient location during evaluation: PACU Anesthesia Type: General Level of consciousness: awake and alert Pain management: pain level controlled Vital Signs Assessment: post-procedure vital signs reviewed and stable Respiratory status: spontaneous breathing, nonlabored ventilation, respiratory function stable and patient connected to nasal cannula oxygen Cardiovascular status: blood pressure returned to baseline and stable Postop Assessment: no apparent nausea or vomiting Anesthetic complications: no    Last Vitals:  Vitals:   09/14/17 0530 09/14/17 0931  BP: 127/65 137/82  Pulse: 91 99  Resp: 16 18  Temp: 36.9 C 36.7 C  SpO2: 99% 98%    Last Pain:  Vitals:   09/14/17 1146  TempSrc:   PainSc: Tyler Deis

## 2017-09-14 NOTE — Progress Notes (Signed)
Microbiology lab called and stated that patients cultures from yesterdays surgery showed gram negative rods. MD notified. Will continue to monitor.

## 2017-09-14 NOTE — Progress Notes (Signed)
PROGRESS NOTE    Derrick [name]  RJJ:[id number] DOB: 23-Jan-1932 DOA: 09/09/2017 PCP: [name], MD     Brief Narrative:  Derrick [name] a 83 y.o.malewith hx of COPD, mild CKD, HL, HTN, prostate cancer, hx RCCa sp nephrectomy presented to ED w/ recurrent syncope. In ED, BP was in the 70's, BP's improved after NS bolus to 90's-100's and pt feeling better. Patient was admitted due to syncope and AKI.   In August pt had paronychia of the R great toe which was complicated by infection then gangrene. He underwent angiogram right legAug 23rd by IR Dr [name] w/ balloon PTA to the ant tibial artery. R PT artery couldn't be crossed due to calcification. He got worse and underwent R 1/2 toe amputation on 06/13/17 by Dr. [name]. He then had another IR procedure by Dr [name] on Nov 1st with PTA to prox AT and PT. They were unable to treat the distal tibial vessels/pedal vesselsdue to "dense coral-reef calcification". The foot progressed and he was admitted and underwent R TMA on 08/14/17, again by Dr [name]. He has had persistent pain in the R foot / TMA stump and was taking percocet but said itwasn't working so was changed to po dilaudid. He was started recently on oral abx, cipro and clindamycin and has been taking these.   Since his hospitalization, blood pressure and AKI continued to improve. He was taken for debridement, revision transmetatarsal amputation with wound vac placement on 12/7 with Dr. [name].   Assessment & Plan:   Principal Problem:   Hypotension Active Problems:   Hypertension   CKD (chronic kidney disease), stage III (HCC)   Hyponatremia   COPD (chronic obstructive pulmonary disease) (HCC)   Malaise   Anorexia   Volume depletion   AKI (acute kidney injury) ([place])   Syncope   Syncope -Likely secondary to dehydration from nausea and poor oral intake secondary to possible wound infection -Repeat orthostatic VS improved. No further syncopal events. BP stable.      Right transmetarsal wound infection -Has been on several outpatient oral antibiotics and has failed (per pharmacy: doxycycline 9/17, keflex 11/7, clindamycin 11/12, augmentin 11/20, bactrim 11/20, and cipro 11/30)  -Patient had osteomyelitis and gangrene of right toes with nonhealing surgical wounds secondary to PAD s/p transmetatarsal amputation November 2018 -Blood cultures are no growth to date -Wound culture with multiple organism  -Podiatry consulted and following  -Foot xray revealed large soft tissue ulceration overlying distal portion residual fifth metatarsal with possible lytic destruction suggesting osteomyelitis -S/p revision transmetatarsal amputation, debridement, wound vac placement 12/7 with Dr. [name] -ID following, on dapto/zosyn. PICC in place.  -Await further culture results for outpatient antibiotic regimen   Acute kidney injury on chronic kidney injury, stage III -Secondary to dehydration and hypotension -Resolved, Cr normal   Normocytic anemia -Drop in hemoglobin likely dilutional, Hgb remained stable   PAD  -With recently RLE revascularization with IR  -Continue aspirin, Plavix  Essential hypertension -Medications on hold secondary to to hypotension and syncope. BP stable off antihypertensive   GERD -Continue PPI    DVT prophylaxis: lovenox Code Status: full Family Communication: wife at bedside Disposition Plan: pending culture results, home with [facility]    Consultants:   Podiatry   ID   Procedures:   S/p revision transmetatarsal amputation, debridement, wound vac placement 12/7 with Dr. [name]  Antimicrobials:  Anti-infectives (From admission, onward)   Start     Dose/Rate Route Frequency Ordered Stop   09/13/17  1000  DAPTOmycin (CUBICIN) 400 mg in sodium chloride 0.9 % IVPB     6 mg/kg  66.7 kg 216 mL/hr over 30 Minutes Intravenous Every 24 hours 09/12/17 1553     09/11/17 1700  vancomycin (VANCOCIN) IVPB 750 mg/150 ml premix  Status:   Discontinued     750 mg 150 mL/hr over 60 Minutes Intravenous Every 48 hours 09/09/17 1754 09/11/17 0840   09/11/17 1400  piperacillin-tazobactam (ZOSYN) IVPB 3.375 g     3.375 g 12.5 mL/hr over 240 Minutes Intravenous Every 8 hours 09/11/17 0840     09/11/17 1000  vancomycin (VANCOCIN) IVPB 1000 mg/200 mL premix  Status:  Discontinued     1,000 mg 200 mL/hr over 60 Minutes Intravenous Every 24 hours 09/11/17 0840 09/12/17 1541   09/10/17 1000  hydroxychloroquine (PLAQUENIL) tablet 200 mg     200 mg Oral Daily 09/09/17 2020     09/09/17 1700  piperacillin-tazobactam (ZOSYN) IVPB 2.25 g  Status:  Discontinued     2.25 g 100 mL/hr over 30 Minutes Intravenous Every 8 hours 09/09/17 1624 09/11/17 0840   09/09/17 1700  vancomycin (VANCOCIN) IVPB 750 mg/150 ml premix     750 mg 150 mL/hr over 60 Minutes Intravenous  Once 09/09/17 1624 09/09/17 1748       Subjective: Patient without new complaints today. Doing well. Sitting in bed, joking, smiling.   Objective: Vitals:   09/13/17 2125 09/14/17 0440 09/14/17 0530 09/14/17 0931  BP: (!) 143/83  127/65 137/82  Pulse: 87  91 99  Resp: 16  16 18   Temp: 98.5 F (36.9 C)  98.4 F (36.9 C) 98 F (36.7 C)  TempSrc: Oral  Oral Oral  SpO2: 98%  99% 98%  Weight: 66.9 kg (147 lb 7.8 oz) 66.9 kg (147 lb 7.8 oz)    Height:        Intake/Output Summary (Last 24 hours) at 09/14/2017 1352 Last data filed at 09/14/2017 1257 Gross per 24 hour  Intake 1915 ml  Output 930 ml  Net 985 ml   Filed Weights   09/13/17 0316 09/13/17 2125 09/14/17 0440  Weight: 66.8 kg (147 lb 4.3 oz) 66.9 kg (147 lb 7.8 oz) 66.9 kg (147 lb 7.8 oz)    Examination:  General exam: Appears calm and comfortable  Respiratory system: Clear to auscultation. Respiratory effort normal. Cardiovascular system: S1 & S2 heard, RRR. No JVD, murmurs, rubs, gallops or clicks. No pedal edema. Gastrointestinal system: Abdomen is nondistended, soft and nontender. No organomegaly or  masses felt. Normal bowel sounds heard. Central nervous system: Alert and oriented. No focal neurological deficits. Extremities: +Right foot s/p transmetatarsal amputation, dressing clean, wound vac in place  Psychiatry: Judgement and insight appear normal. Mood & affect appropriate.   Data Reviewed: I have personally reviewed following labs and imaging studies  CBC: Recent Labs  Lab 09/09/17 1503 09/10/17 0508 09/11/17 0658 09/12/17 0535 09/13/17 0347 09/14/17 0410  WBC 11.3* 6.6 6.8 6.1 7.1 8.2  NEUTROABS 7.2  --   --   --   --   --   HGB 10.9* 9.1* 9.3* 9.1* 9.2* 9.5*  HCT 32.2* 27.8* 28.2* 27.7* 28.1* 28.5*  MCV 85.2 84.8 86.0 86.3 85.9 87.4  PLT 337 232 233 211 225 761   Basic Metabolic Panel: Recent Labs  Lab 09/10/17 0508 09/11/17 0658 09/12/17 0535 09/13/17 0347 09/14/17 0410  NA 131* 134* 135 137 137  K 3.1* 3.8 3.5 3.5 3.7  CL 100* 101 104  106 105  CO2 21* 21* 22 24 21*  GLUCOSE 94 91 85 94 116*  BUN 26* 16 11 8 8   CREATININE 2.01* 1.43* 1.31* 1.33* 1.16  CALCIUM 8.6* 8.8* 8.7* 8.8* 8.9   GFR: Estimated Creatinine Clearance: 46.5 mL/min (by C-G formula based on SCr of 1.16 mg/dL). Liver Function Tests: Recent Labs  Lab 09/09/17 1503  AST 23  ALT 21  ALKPHOS 51  BILITOT 0.8  PROT 6.4*  ALBUMIN 3.5   No results for input(s): LIPASE, AMYLASE in the last 168 hours. No results for input(s): AMMONIA in the last 168 hours. Coagulation Profile: Recent Labs  Lab 09/09/17 1503  INR 1.10   Cardiac Enzymes: Recent Labs  Lab 09/12/17 1616 09/13/17 0347  CKTOTAL 47* 45*   BNP (last 3 results) No results for input(s): PROBNP in the last 8760 hours. HbA1C: No results for input(s): HGBA1C in the last 72 hours. CBG: No results for input(s): GLUCAP in the last 168 hours. Lipid Profile: No results for input(s): CHOL, HDL, LDLCALC, TRIG, CHOLHDL, LDLDIRECT in the last 72 hours. Thyroid Function Tests: No results for input(s): TSH, T4TOTAL, FREET4,  T3FREE, THYROIDAB in the last 72 hours. Anemia Panel: No results for input(s): VITAMINB12, FOLATE, FERRITIN, TIBC, IRON, RETICCTPCT in the last 72 hours. Sepsis Labs: Recent Labs  Lab 09/09/17 1508 09/09/17 1750  LATICACIDVEN 2.38* 1.65    Recent Results (from the past 240 hour(s))  Culture, blood (Routine x 2)     Status: None   Collection Time: 09/09/17  3:16 PM  Result Value Ref Range Status   Specimen Description BLOOD RIGHT ANTECUBITAL  Final   Special Requests   Final    BOTTLES DRAWN AEROBIC AND ANAEROBIC Blood Culture adequate volume   Culture NO GROWTH 5 DAYS  Final   Report Status 09/14/2017 FINAL  Final  Culture, blood (Routine x 2)     Status: None   Collection Time: 09/09/17  3:29 PM  Result Value Ref Range Status   Specimen Description BLOOD LEFT ANTECUBITAL  Final   Special Requests   Final    BOTTLES DRAWN AEROBIC AND ANAEROBIC Blood Culture adequate volume   Culture NO GROWTH 5 DAYS  Final   Report Status 09/14/2017 FINAL  Final  Urine Culture     Status: Abnormal   Collection Time: 09/09/17  5:26 PM  Result Value Ref Range Status   Specimen Description URINE, RANDOM  Final   Special Requests NONE  Final   Culture <10,000 COLONIES/mL INSIGNIFICANT GROWTH (A)  Final   Report Status 09/10/2017 FINAL  Final  Aerobic Culture (superficial specimen)     Status: Abnormal   Collection Time: 09/09/17  8:18 PM  Result Value Ref Range Status   Specimen Description WOUND FOOT  Final   Special Requests AMPUTATION  Final   Gram Stain   Final    FEW WBC PRESENT, PREDOMINANTLY PMN RARE GRAM POSITIVE RODS    Culture MULTIPLE ORGANISMS PRESENT, NONE PREDOMINANT (A)  Final   Report Status 09/12/2017 FINAL  Final  Aerobic/Anaerobic Culture (surgical/deep wound)     Status: None (Preliminary result)   Collection Time: 09/13/17  5:30 PM  Result Value Ref Range Status   Specimen Description WOUND  Final   Special Requests DEEP SOFT TISSUE CENTRAL  Final   Gram Stain NO  WBC SEEN NO ORGANISMS SEEN   Final   Culture NO GROWTH 1 DAY  Final   Report Status PENDING  Incomplete  Aerobic/Anaerobic Culture (surgical/deep  wound)     Status: None (Preliminary result)   Collection Time: 09/13/17  5:31 PM  Result Value Ref Range Status   Specimen Description WOUND  Final   Special Requests DEEP SOFT TISSUE INTERNAL  Final   Gram Stain   Final    RARE WBC PRESENT,BOTH PMN AND MONONUCLEAR NO ORGANISMS SEEN    Culture CULTURE REINCUBATED FOR BETTER GROWTH  Final   Report Status PENDING  Incomplete  Aerobic/Anaerobic Culture (surgical/deep wound)     Status: None (Preliminary result)   Collection Time: 09/13/17  5:32 PM  Result Value Ref Range Status   Specimen Description WOUND  Final   Special Requests FIFTH METATARSAL BONE  Final   Gram Stain   Final    RARE WBC PRESENT, PREDOMINANTLY MONONUCLEAR NO ORGANISMS SEEN    Culture CULTURE REINCUBATED FOR BETTER GROWTH  Final   Report Status PENDING  Incomplete  Aerobic/Anaerobic Culture (surgical/deep wound)     Status: None (Preliminary result)   Collection Time: 09/13/17  5:33 PM  Result Value Ref Range Status   Specimen Description WOUND  Final   Special Requests THIRD METATARSAL BONE  Final   Gram Stain   Final    WBC PRESENT, PREDOMINANTLY PMN NO ORGANISMS SEEN    Culture NO GROWTH 1 DAY  Final   Report Status PENDING  Incomplete  Aerobic/Anaerobic Culture (surgical/deep wound)     Status: None (Preliminary result)   Collection Time: 09/13/17  5:41 PM  Result Value Ref Range Status   Specimen Description WOUND  Final   Special Requests FIRST METATARSAL BONE  Final   Gram Stain   Final    RARE WBC PRESENT, PREDOMINANTLY PMN NO ORGANISMS SEEN    Culture NO GROWTH 1 DAY  Final   Report Status PENDING  Incomplete       Radiology Studies: Dg Foot Complete Right  Result Date: 09/13/2017 CLINICAL DATA:  Status post transmetatarsal amputation today. EXAM: RIGHT FOOT COMPLETE - 3+ VIEW COMPARISON:   Plain films of the right foot 09/11/2017. FINDINGS: Previously seen and transmetatarsal amputation has been extended. No acute abnormality is identified. Surgical staples are in place. Surgical drain is noted. IMPRESSION: Extension of prior transmetatarsal amputation.  No acute abnormality Electronically Signed   By: Inge Rise M.D.   On: 09/13/2017 20:36      Scheduled Meds: . amitriptyline  25 mg Oral Daily  . aspirin EC  81 mg Oral QHS  . clopidogrel  75 mg Oral Daily  . enoxaparin (LOVENOX) injection  40 mg Subcutaneous Q24H  . finasteride  5 mg Oral Daily  . hydroxychloroquine  200 mg Oral Daily  . multivitamin with minerals  1 tablet Oral Daily  . pantoprazole  40 mg Oral BID  . predniSONE  5 mg Oral Q breakfast  . vitamin B-12  1,000 mcg Oral Daily   Continuous Infusions: . sodium chloride 50 mL/hr at 09/13/17 2019  . DAPTOmycin (CUBICIN)  IV Stopped (09/14/17 1253)  . piperacillin-tazobactam (ZOSYN)  IV Stopped (09/14/17 1050)     LOS: 3 days    Time spent: 30 minutes   Dessa Phi, DO [facility] [email or web address] Password [email or web address] 09/14/2017, 1:52 PM

## 2017-09-15 ENCOUNTER — Encounter (HOSPITAL_COMMUNITY): Payer: Self-pay | Admitting: Podiatry

## 2017-09-15 LAB — ACID FAST SMEAR (AFB, MYCOBACTERIA)
Acid Fast Smear: NEGATIVE
Acid Fast Smear: NEGATIVE
Acid Fast Smear: NEGATIVE
Acid Fast Smear: NEGATIVE

## 2017-09-15 LAB — ACID FAST SMEAR (AFB): ACID FAST SMEAR - AFSCU2: NEGATIVE

## 2017-09-15 LAB — BASIC METABOLIC PANEL
ANION GAP: 8 (ref 5–15)
BUN: 10 mg/dL (ref 6–20)
CALCIUM: 8.4 mg/dL — AB (ref 8.9–10.3)
CO2: 24 mmol/L (ref 22–32)
Chloride: 104 mmol/L (ref 101–111)
Creatinine, Ser: 1.26 mg/dL — ABNORMAL HIGH (ref 0.61–1.24)
GFR, EST AFRICAN AMERICAN: 60 mL/min — AB (ref >60)
GFR, EST NON AFRICAN AMERICAN: 51 mL/min — AB (ref >60)
Glucose, Bld: 100 mg/dL — ABNORMAL HIGH (ref 65–99)
Potassium: 3.2 mmol/L — ABNORMAL LOW (ref 3.5–5.1)
Sodium: 136 mmol/L (ref 135–145)

## 2017-09-15 LAB — CBC
HCT: 26.2 % — ABNORMAL LOW (ref 39.0–52.0)
HEMOGLOBIN: 8.6 g/dL — AB (ref 13.0–17.0)
MCH: 28.4 pg (ref 26.0–34.0)
MCHC: 32.8 g/dL (ref 30.0–36.0)
MCV: 86.5 fL (ref 78.0–100.0)
Platelets: 181 10*3/uL (ref 150–400)
RBC: 3.03 MIL/uL — AB (ref 4.22–5.81)
RDW: 15.4 % (ref 11.5–15.5)
WBC: 8.9 10*3/uL (ref 4.0–10.5)

## 2017-09-15 MED ORDER — POTASSIUM CHLORIDE CRYS ER 20 MEQ PO TBCR
40.0000 meq | EXTENDED_RELEASE_TABLET | Freq: Once | ORAL | Status: AC
  Administered 2017-09-15: 40 meq via ORAL
  Filled 2017-09-15: qty 2

## 2017-09-15 NOTE — Progress Notes (Addendum)
PROGRESS NOTE    Derrick [name]  XIP:[id number] DOB: May 11, 1932 DOA: 09/09/2017 PCP: [name], MD     Brief Narrative:  Derrick [name] a 83 y.o.malewith hx of COPD, mild CKD, HL, HTN, prostate cancer, hx RCCa sp nephrectomy presented to ED w/ recurrent syncope. In ED, BP was in the 70's, BP's improved after NS bolus to 90's-100's and pt feeling better. Patient was admitted due to syncope and AKI.   In August pt had paronychia of the R great toe which was complicated by infection then gangrene. He underwent angiogram right legAug 23rd by IR Dr [name] w/ balloon PTA to the ant tibial artery. R PT artery couldn't be crossed due to calcification. He got worse and underwent R 1/2 toe amputation on 06/13/17 by Dr. [name]. He then had another IR procedure by Dr [name] on Nov 1st with PTA to prox AT and PT. They were unable to treat the distal tibial vessels/pedal vesselsdue to "dense coral-reef calcification". The foot progressed and he was admitted and underwent R TMA on 08/14/17, again by Dr [name]. He has had persistent pain in the R foot / TMA stump and was taking percocet but said itwasn't working so was changed to po dilaudid. He was started recently on oral abx, cipro and clindamycin and has been taking these.   Since his hospitalization, blood pressure and AKI continued to improve. He was taken for debridement, revision transmetatarsal amputation with wound vac placement on 12/7 with Dr. [name].   Assessment & Plan:   Principal Problem:   Hypotension Active Problems:   Hypertension   CKD (chronic kidney disease), stage III (HCC)   Hyponatremia   COPD (chronic obstructive pulmonary disease) (HCC)   Malaise   Anorexia   Volume depletion   AKI (acute kidney injury) ([place])   Syncope   Syncope -Likely secondary to dehydration from nausea and poor oral intake secondary to possible wound infection -Repeat orthostatic VS improved. No further syncopal events. BP stable.      Right transmetarsal wound infection -Has been on several outpatient oral antibiotics and has failed (per pharmacy: doxycycline 9/17, keflex 11/7, clindamycin 11/12, augmentin 11/20, bactrim 11/20, and cipro 11/30)  -Patient had osteomyelitis and gangrene of right toes with nonhealing surgical wounds secondary to PAD s/p transmetatarsal amputation November 2018 -Blood cultures are negative  -Wound culture with multiple organism  -Podiatry consulted and following. Patient to follow up in office 12/11 and 12/13  -Foot xray revealed large soft tissue ulceration overlying distal portion residual fifth metatarsal with possible lytic destruction suggesting osteomyelitis -S/p revision transmetatarsal amputation, debridement, wound vac placement 12/7 with Dr. [name] -Deep soft tissue culture showed enterobacter cloacae, pending final culture  -ID following, on dapto/zosyn. PICC in place.   Acute kidney injury on chronic kidney injury, stage III -Secondary to dehydration and hypotension -Resolved  Normocytic anemia -Drop in hemoglobin likely dilutional, Hgb remained stable   PAD  -With recently RLE revascularization with IR  -Continue aspirin, Plavix  Essential hypertension -Medications on hold secondary to to hypotension and syncope. BP stable off antihypertensive   GERD -Continue PPI  Hypokalemia -Replace   DVT prophylaxis: lovenox Code Status: full Family Communication: no family at bedside Disposition Plan: pending culture results, home with [facility]    Consultants:   Podiatry   ID   Procedures:   S/p revision transmetatarsal amputation, debridement, wound vac placement 12/7 with Dr. [name]  Antimicrobials:  Anti-infectives (From admission, onward)   Start  Dose/Rate Route Frequency Ordered Stop   09/13/17 1000  DAPTOmycin (CUBICIN) 400 mg in sodium chloride 0.9 % IVPB     6 mg/kg  66.7 kg 216 mL/hr over 30 Minutes Intravenous Every 24 hours 09/12/17 1553      09/11/17 1700  vancomycin (VANCOCIN) IVPB 750 mg/150 ml premix  Status:  Discontinued     750 mg 150 mL/hr over 60 Minutes Intravenous Every 48 hours 09/09/17 1754 09/11/17 0840   09/11/17 1400  piperacillin-tazobactam (ZOSYN) IVPB 3.375 g     3.375 g 12.5 mL/hr over 240 Minutes Intravenous Every 8 hours 09/11/17 0840     09/11/17 1000  vancomycin (VANCOCIN) IVPB 1000 mg/200 mL premix  Status:  Discontinued     1,000 mg 200 mL/hr over 60 Minutes Intravenous Every 24 hours 09/11/17 0840 09/12/17 1541   09/10/17 1000  hydroxychloroquine (PLAQUENIL) tablet 200 mg     200 mg Oral Daily 09/09/17 2020     09/09/17 1700  piperacillin-tazobactam (ZOSYN) IVPB 2.25 g  Status:  Discontinued     2.25 g 100 mL/hr over 30 Minutes Intravenous Every 8 hours 09/09/17 1624 09/11/17 0840   09/09/17 1700  vancomycin (VANCOCIN) IVPB 750 mg/150 ml premix     750 mg 150 mL/hr over 60 Minutes Intravenous  Once 09/09/17 1624 09/09/17 1748       Subjective: Patient without new complaints today. Doing well. Pain well controlled   Objective: Vitals:   09/14/17 1629 09/14/17 2142 09/15/17 0448 09/15/17 1000  BP: 129/74 (!) 142/58 121/64 124/67  Pulse: 93 (!) 125 96 90  Resp: 16 17 16 18   Temp: 98.6 F (37 C) 97.8 F (36.6 C) 98.7 F (37.1 C) 98 F (36.7 C)  TempSrc: Oral   Oral  SpO2: 99% 100% 98% 97%  Weight:  66.9 kg (147 lb 7.8 oz)    Height:        Intake/Output Summary (Last 24 hours) at 09/15/2017 1216 Last data filed at 09/15/2017 1154 Gross per 24 hour  Intake 1446.67 ml  Output 1700 ml  Net -253.33 ml   Filed Weights   09/13/17 2125 09/14/17 0440 09/14/17 2142  Weight: 66.9 kg (147 lb 7.8 oz) 66.9 kg (147 lb 7.8 oz) 66.9 kg (147 lb 7.8 oz)    Examination:  General exam: Appears calm and comfortable  Respiratory system: Clear to auscultation. Respiratory effort normal. Cardiovascular system: S1 & S2 heard, RRR. No JVD, murmurs, rubs, gallops or clicks. No pedal  edema. Gastrointestinal system: Abdomen is nondistended, soft and nontender. No organomegaly or masses felt. Normal bowel sounds heard. Central nervous system: Alert and oriented. No focal neurological deficits. Extremities: +Right foot s/p transmetatarsal amputation, dressing clean, wound vac in place  Psychiatry: Judgement and insight appear normal. Mood & affect appropriate.   Data Reviewed: I have personally reviewed following labs and imaging studies  CBC: Recent Labs  Lab 09/09/17 1503  09/11/17 0658 09/12/17 0535 09/13/17 0347 09/14/17 0410 09/15/17 0404  WBC 11.3*   < > 6.8 6.1 7.1 8.2 8.9  NEUTROABS 7.2  --   --   --   --   --   --   HGB 10.9*   < > 9.3* 9.1* 9.2* 9.5* 8.6*  HCT 32.2*   < > 28.2* 27.7* 28.1* 28.5* 26.2*  MCV 85.2   < > 86.0 86.3 85.9 87.4 86.5  PLT 337   < > 233 211 225 230 181   < > = values in this interval  not displayed.   Basic Metabolic Panel: Recent Labs  Lab 09/11/17 0658 09/12/17 0535 09/13/17 0347 09/14/17 0410 09/15/17 0404  NA 134* 135 137 137 136  K 3.8 3.5 3.5 3.7 3.2*  CL 101 104 106 105 104  CO2 21* 22 24 21* 24  GLUCOSE 91 85 94 116* 100*  BUN 16 11 8 8 10   CREATININE 1.43* 1.31* 1.33* 1.16 1.26*  CALCIUM 8.8* 8.7* 8.8* 8.9 8.4*   GFR: Estimated Creatinine Clearance: 42.8 mL/min (A) (by C-G formula based on SCr of 1.26 mg/dL (H)). Liver Function Tests: Recent Labs  Lab 09/09/17 1503  AST 23  ALT 21  ALKPHOS 51  BILITOT 0.8  PROT 6.4*  ALBUMIN 3.5   No results for input(s): LIPASE, AMYLASE in the last 168 hours. No results for input(s): AMMONIA in the last 168 hours. Coagulation Profile: Recent Labs  Lab 09/09/17 1503  INR 1.10   Cardiac Enzymes: Recent Labs  Lab 09/12/17 1616 09/13/17 0347  CKTOTAL 47* 45*   BNP (last 3 results) No results for input(s): PROBNP in the last 8760 hours. HbA1C: No results for input(s): HGBA1C in the last 72 hours. CBG: No results for input(s): GLUCAP in the last 168  hours. Lipid Profile: No results for input(s): CHOL, HDL, LDLCALC, TRIG, CHOLHDL, LDLDIRECT in the last 72 hours. Thyroid Function Tests: No results for input(s): TSH, T4TOTAL, FREET4, T3FREE, THYROIDAB in the last 72 hours. Anemia Panel: No results for input(s): VITAMINB12, FOLATE, FERRITIN, TIBC, IRON, RETICCTPCT in the last 72 hours. Sepsis Labs: Recent Labs  Lab 09/09/17 1508 09/09/17 1750  LATICACIDVEN 2.38* 1.65    Recent Results (from the past 240 hour(s))  Culture, blood (Routine x 2)     Status: None   Collection Time: 09/09/17  3:16 PM  Result Value Ref Range Status   Specimen Description BLOOD RIGHT ANTECUBITAL  Final   Special Requests   Final    BOTTLES DRAWN AEROBIC AND ANAEROBIC Blood Culture adequate volume   Culture NO GROWTH 5 DAYS  Final   Report Status 09/14/2017 FINAL  Final  Culture, blood (Routine x 2)     Status: None   Collection Time: 09/09/17  3:29 PM  Result Value Ref Range Status   Specimen Description BLOOD LEFT ANTECUBITAL  Final   Special Requests   Final    BOTTLES DRAWN AEROBIC AND ANAEROBIC Blood Culture adequate volume   Culture NO GROWTH 5 DAYS  Final   Report Status 09/14/2017 FINAL  Final  Urine Culture     Status: Abnormal   Collection Time: 09/09/17  5:26 PM  Result Value Ref Range Status   Specimen Description URINE, RANDOM  Final   Special Requests NONE  Final   Culture <10,000 COLONIES/mL INSIGNIFICANT GROWTH (A)  Final   Report Status 09/10/2017 FINAL  Final  Aerobic Culture (superficial specimen)     Status: Abnormal   Collection Time: 09/09/17  8:18 PM  Result Value Ref Range Status   Specimen Description WOUND FOOT  Final   Special Requests AMPUTATION  Final   Gram Stain   Final    FEW WBC PRESENT, PREDOMINANTLY PMN RARE GRAM POSITIVE RODS    Culture MULTIPLE ORGANISMS PRESENT, NONE PREDOMINANT (A)  Final   Report Status 09/12/2017 FINAL  Final  Aerobic/Anaerobic Culture (surgical/deep wound)     Status: None  (Preliminary result)   Collection Time: 09/13/17  5:30 PM  Result Value Ref Range Status   Specimen Description WOUND  Final  Special Requests DEEP SOFT TISSUE CENTRAL  Final   Gram Stain NO WBC SEEN NO ORGANISMS SEEN   Final   Culture   Final    NO GROWTH 2 DAYS NO ANAEROBES ISOLATED; CULTURE IN PROGRESS FOR 5 DAYS   Report Status PENDING  Incomplete  Acid Fast Smear (AFB)     Status: None   Collection Time: 09/13/17  5:30 PM  Result Value Ref Range Status   AFB Specimen Processing Comment  Final    Comment: Tissue Grinding and Digestion/Decontamination   Acid Fast Smear Negative  Final    Comment: (NOTE) Performed At: [facility] [place], [place] [id number] Rush Farmer MD JH:[id number]    Source (AFB) WOUND  Final    Comment: DEEP SOFT TISSUE CENTRAL  Aerobic/Anaerobic Culture (surgical/deep wound)     Status: None (Preliminary result)   Collection Time: 09/13/17  5:31 PM  Result Value Ref Range Status   Specimen Description WOUND  Final   Special Requests DEEP SOFT TISSUE INTERNAL  Final   Gram Stain   Final    RARE WBC PRESENT,BOTH PMN AND MONONUCLEAR NO ORGANISMS SEEN    Culture   Final    FEW ENTEROBACTER CLOACAE CRITICAL RESULT CALLED TO, READ BACK BY AND VERIFIED WITH: A MABE,RN AT 1705 09/14/17 BY L BENFIELD CONCERNING GROWTH ON CULTURE HOLDING FOR POSSIBLE ANAEROBE    Report Status PENDING  Incomplete   Organism ID, Bacteria ENTEROBACTER CLOACAE  Final      Susceptibility   Enterobacter cloacae - MIC*    CEFAZOLIN >=64 RESISTANT Resistant     CEFEPIME <=1 SENSITIVE Sensitive     CEFTAZIDIME <=1 SENSITIVE Sensitive     CEFTRIAXONE <=1 SENSITIVE Sensitive     CIPROFLOXACIN <=0.25 SENSITIVE Sensitive     GENTAMICIN <=1 SENSITIVE Sensitive     IMIPENEM <=0.25 SENSITIVE Sensitive     TRIMETH/SULFA <=20 SENSITIVE Sensitive     PIP/TAZO <=4 SENSITIVE Sensitive     * FEW ENTEROBACTER CLOACAE  Acid Fast Smear (AFB)     Status: None    Collection Time: 09/13/17  5:31 PM  Result Value Ref Range Status   AFB Specimen Processing Concentration  Final   Acid Fast Smear Negative  Final    Comment: (NOTE) Performed At: [facility] [address] [place], [place] [id number] Rush Farmer MD WY:[id number]    Source (AFB) WOUND  Final    Comment: DEEP SOFT TISSUE INTERNAL  Aerobic/Anaerobic Culture (surgical/deep wound)     Status: None (Preliminary result)   Collection Time: 09/13/17  5:32 PM  Result Value Ref Range Status   Specimen Description WOUND  Final   Special Requests FIFTH METATARSAL BONE  Final   Gram Stain   Final    RARE WBC PRESENT, PREDOMINANTLY MONONUCLEAR NO ORGANISMS SEEN    Culture   Final    RARE GRAM NEGATIVE RODS NO ANAEROBES ISOLATED; CULTURE IN PROGRESS FOR 5 DAYS CRITICAL RESULT CALLED TO, READ BACK BY AND VERIFIED WITH: A MABE,RN AT 1705 09/14/17 BY L BENFIELD CONCERNING GROWTH ON CULTURE    Report Status PENDING  Incomplete  Acid Fast Smear (AFB)     Status: None   Collection Time: 09/13/17  5:32 PM  Result Value Ref Range Status   AFB Specimen Processing Comment  Final    Comment: Tissue Grinding and Digestion/Decontamination   Acid Fast Smear Negative  Final    Comment: (NOTE) Performed At: [place] [place],  [place] [id number] Rush Farmer MD HK:[id number]    Source (AFB) WOUND  Final    Comment: FIFTH METATARSAL BONE  Aerobic/Anaerobic Culture (surgical/deep wound)     Status: None (Preliminary result)   Collection Time: 09/13/17  5:33 PM  Result Value Ref Range Status   Specimen Description WOUND  Final   Special Requests THIRD METATARSAL BONE  Final   Gram Stain   Final    WBC PRESENT, PREDOMINANTLY PMN NO ORGANISMS SEEN    Culture   Final    CULTURE REINCUBATED FOR BETTER GROWTH GROWTH ON CULTURE. CRITICAL VALUE NOTED.  VALUE IS CONSISTENT WITH PREVIOUSLY REPORTED AND CALLED VALUE.    Report Status PENDING  Incomplete  Acid Fast Smear  (AFB)     Status: None   Collection Time: 09/13/17  5:33 PM  Result Value Ref Range Status   AFB Specimen Processing Comment  Final    Comment: Tissue Grinding and Digestion/Decontamination   Acid Fast Smear Negative  Final    Comment: (NOTE) Performed At: [facility] [address] [place], [place] [id number] Rush Farmer MD OA:[id number]    Source (AFB) WOUND  Final    Comment: THIRD METATARSAL BONE  Aerobic/Anaerobic Culture (surgical/deep wound)     Status: None (Preliminary result)   Collection Time: 09/13/17  5:41 PM  Result Value Ref Range Status   Specimen Description WOUND  Final   Special Requests FIRST METATARSAL BONE  Final   Gram Stain   Final    RARE WBC PRESENT, PREDOMINANTLY PMN NO ORGANISMS SEEN    Culture   Final    CULTURE REINCUBATED FOR BETTER GROWTH NO ANAEROBES ISOLATED; CULTURE IN PROGRESS FOR 5 DAYS    Report Status PENDING  Incomplete  Acid Fast Smear (AFB)     Status: None   Collection Time: 09/13/17  5:41 PM  Result Value Ref Range Status   AFB Specimen Processing Concentration  Final   Acid Fast Smear Negative  Final    Comment: (NOTE) Performed At: [facility] [place], [place] [id number] Rush Farmer MD DD:[id number]    Source (AFB) WOUND  Final    Comment: FIRST METATARSAL BONE       Radiology Studies: Dg Foot Complete Right  Result Date: 09/13/2017 CLINICAL DATA:  Status post transmetatarsal amputation today. EXAM: RIGHT FOOT COMPLETE - 3+ VIEW COMPARISON:  Plain films of the right foot 09/11/2017. FINDINGS: Previously seen and transmetatarsal amputation has been extended. No acute abnormality is identified. Surgical staples are in place. Surgical drain is noted. IMPRESSION: Extension of prior transmetatarsal amputation.  No acute abnormality Electronically Signed   By: Inge Rise M.D.   On: 09/13/2017 20:36      Scheduled Meds: . amitriptyline  25 mg Oral Daily  . aspirin EC  81 mg Oral  QHS  . clopidogrel  75 mg Oral Daily  . enoxaparin (LOVENOX) injection  40 mg Subcutaneous Q24H  . finasteride  5 mg Oral Daily  . hydroxychloroquine  200 mg Oral Daily  . multivitamin with minerals  1 tablet Oral Daily  . pantoprazole  40 mg Oral BID  . predniSONE  5 mg Oral Q breakfast  . vitamin B-12  1,000 mcg Oral Daily   Continuous Infusions: . DAPTOmycin (CUBICIN)  IV 400 mg (09/15/17 1149)  . piperacillin-tazobactam (ZOSYN)  IV Stopped (09/15/17 1149)     LOS: 4 days    Time spent: 30 minutes   Dessa Phi, DO [facility] [email or web address] Password  [email or web address] 09/15/2017, 12:16 PM

## 2017-09-16 DIAGNOSIS — I10 Essential (primary) hypertension: Secondary | ICD-10-CM

## 2017-09-16 DIAGNOSIS — I129 Hypertensive chronic kidney disease with stage 1 through stage 4 chronic kidney disease, or unspecified chronic kidney disease: Secondary | ICD-10-CM

## 2017-09-16 DIAGNOSIS — M869 Osteomyelitis, unspecified: Secondary | ICD-10-CM

## 2017-09-16 DIAGNOSIS — M86671 Other chronic osteomyelitis, right ankle and foot: Secondary | ICD-10-CM

## 2017-09-16 DIAGNOSIS — J449 Chronic obstructive pulmonary disease, unspecified: Secondary | ICD-10-CM

## 2017-09-16 LAB — CBC
HEMATOCRIT: 27.4 % — AB (ref 39.0–52.0)
Hemoglobin: 9 g/dL — ABNORMAL LOW (ref 13.0–17.0)
MCH: 28.9 pg (ref 26.0–34.0)
MCHC: 32.8 g/dL (ref 30.0–36.0)
MCV: 88.1 fL (ref 78.0–100.0)
PLATELETS: 185 10*3/uL (ref 150–400)
RBC: 3.11 MIL/uL — ABNORMAL LOW (ref 4.22–5.81)
RDW: 15.9 % — AB (ref 11.5–15.5)
WBC: 7.4 10*3/uL (ref 4.0–10.5)

## 2017-09-16 LAB — BASIC METABOLIC PANEL
ANION GAP: 6 (ref 5–15)
BUN: 9 mg/dL (ref 6–20)
CALCIUM: 8.7 mg/dL — AB (ref 8.9–10.3)
CO2: 26 mmol/L (ref 22–32)
CREATININE: 1.15 mg/dL (ref 0.61–1.24)
Chloride: 104 mmol/L (ref 101–111)
GFR calc Af Amer: >60 mL/min (ref >60)
GFR, EST NON AFRICAN AMERICAN: 57 mL/min — AB (ref >60)
GLUCOSE: 91 mg/dL (ref 65–99)
Potassium: 3.6 mmol/L (ref 3.5–5.1)
Sodium: 136 mmol/L (ref 135–145)

## 2017-09-16 LAB — MAGNESIUM: Magnesium: 1.7 mg/dL (ref 1.7–2.4)

## 2017-09-16 MED ORDER — LORAZEPAM 2 MG/ML IJ SOLN
0.5000 mg | Freq: Once | INTRAMUSCULAR | Status: AC
  Administered 2017-09-16: 0.5 mg via INTRAVENOUS
  Filled 2017-09-16: qty 1

## 2017-09-16 MED ORDER — DEXTROSE 5 % IV SOLN
2.0000 g | INTRAVENOUS | Status: DC
  Administered 2017-09-16 – 2017-09-17 (×2): 2 g via INTRAVENOUS
  Filled 2017-09-16 (×2): qty 2

## 2017-09-16 MED ORDER — AMLODIPINE BESYLATE 10 MG PO TABS
10.0000 mg | ORAL_TABLET | Freq: Every day | ORAL | Status: DC
  Administered 2017-09-16 – 2017-09-17 (×2): 10 mg via ORAL
  Filled 2017-09-16 (×2): qty 1

## 2017-09-16 NOTE — Progress Notes (Signed)
Patients bed alarm went off. RN ran to room and found patient sitting at the end of the bed stating he wants to go home. He needs to get ready to go home by 11. RN assessed patient. Patient alert and oriented to self. Patient states that it is morning time. Patient took telemetry leads off and RN noticed that patient pulled out R upper arm PICC line, with it laying bedside him in the bed. RN notified IV Team and on call NP, Schorr. Patient is not bleeding from site.  RN awaiting call back.  RN will continue to monitor patient.  Ermalinda Memos, RN

## 2017-09-16 NOTE — Plan of Care (Signed)
  Clinical Measurements: Ability to maintain clinical measurements within normal limits will improve 09/16/2017 1234 - Progressing by Irish Lack, RN

## 2017-09-16 NOTE — Care Management Note (Signed)
Case Management Note  Patient Details  Name: Derrick [name] MRN: [id number] Date of Birth: [birth year]  Subjective/Objective:     CM following for progression and d/c planning.                Action: Met with pt re HH needs, VAC d/c this afternoon, pt to d/c to home on 09/17/2017. Pt active with [facility].  ID recommending IV Rocephin x 35 more days.  [facility] notified and [facility] to provide IV antibiotics. This CM spoke with [facility] rep, [place] re [facility] and PT services. They will follow for IV antibiotics and labs in the home. They will also work with [facility] for IV Rocephin supply. This CM contacted Carolynn Sayers of [employer] re IV antibiotics. She will see pt and wife prior to d/c on 09/17/2017 for teaching and instruction. Pt informed of plan for d/c on 09/17/2017.  Expected Discharge Date:                  Expected Discharge Plan:  [place]  In-House Referral:  NA  Discharge planning Services  CM Consult  Post Acute Care Choice:    Choice offered to:     DME Arranged:  IV pump/equipment DME Agency:  [place] Arranged:  RN, PT [facility] Agency:  [facility]  Status of Service:  In process, will continue to follow  If discussed at Long Length of Stay Meetings, dates discussed:    Additional Comments:  Adron Bene, RN 09/16/2017, 3:15 PM

## 2017-09-16 NOTE — Progress Notes (Signed)
[place] will provide Home Infusion Pharmacy services to support IV ABX at home with [facility] team.  [facility] will partner with [facility] as they will be providing [facility] nursing services for pt.  Pt already active.   [facility] Infusion Coordinator will connect with Mrs. Andreasen for IV ABX teaching prior to DC if she can come to hospital based on current weather.  [facility] will continue teaching in the home.  If patient discharges after hours, please call [phone].   Larry Sierras 09/16/2017, 3:31 PM

## 2017-09-16 NOTE — Progress Notes (Signed)
Pharmacy Antibiotic Note  Derrick [name] is a 83 y.o. male admitted on 09/09/2017 with syncopal episode, sent from PCP office. Starting empiric abx for sepsis. Pt had recent partial amputation of R foot.  Podiatry evaluated yesterday, ID is also on board. Cultures are growing Enterobacter Cloacae (pan-S except R-Cefazolin) Daptomycin d/ced over the weekend. ID to officially round today - will follow-up on narrowing antibiotics further. Continues on Zosyn at this time.   Plan: 1. Continue Zosyn 3.375g IV every 8 hours (infused over 4 hours) 2. Will continue to follow renal function, culture results, LOT, and antibiotic de-escalation plans   Height: 6' (182.9 cm) Weight: 147 lb 7.8 oz (66.9 kg) IBW/kg (Calculated) : 77.6  Temp (24hrs), Avg:98.3 F (36.8 C), Min:98 F (36.7 C), Max:98.5 F (36.9 C)  Recent Labs  Lab 09/09/17 1508 09/09/17 1750  09/12/17 0535 09/13/17 0347 09/14/17 0410 09/15/17 0404 09/16/17 0442  WBC  --   --    < > 6.1 7.1 8.2 8.9 7.4  CREATININE  --   --    < > 1.31* 1.33* 1.16 1.26* 1.15  LATICACIDVEN 2.38* 1.65  --   --   --   --   --   --    < > = values in this interval not displayed.    Estimated Creatinine Clearance: 46.9 mL/min (by C-G formula based on SCr of 1.15 mg/dL).    Allergies  Allergen Reactions  . Ace Inhibitors Other (See Comments)    Reaction:  Unknown     Antimicrobials this admission: Vancomycin 12/3> >12/6 Zosyn 12/3> > Daptomycin 12/7 >> 12/9  Dose adjustments this admission: 12/5: vancomycin increased to 1g q24h (from 750mg  q48h); Zosyn increased to 3.375g IV q8h (from 2.25g IV q8h)  Microbiology results: 12/3 blood cx: neg 12/3 urine cx: insignificant growth 12/3 foot wound: rare GPRon gram stain. Multiple organisms grew on culture, none predominant 12/7 bone tissue, 1st metatarsal: reincubated  (growth on cx) 12/7 bone tissue, 3rd metatarsal: reincubated (growth on cx) 12/7 bone tissue, 5th metatarsal: rare Enterobacter  cloacae 12/7 deep soft tissue wound: few enterobacter cloacae, R to Cefazolin only  Thank you for allowing pharmacy to be a part of this patient's care.  Alycia Rossetti, PharmD, BCPS Clinical Pharmacist Pager: [phone] Clinical phone for 09/16/2017 from 7a-3:30p: [phone] If after 3:30p, please call main pharmacy at: [phone] 09/16/2017 10:13 AM

## 2017-09-16 NOTE — Progress Notes (Signed)
INFECTIOUS DISEASE PROGRESS NOTE  ID: Derrick [name] is a 83 y.o. male with  Principal Problem:   Hypotension Active Problems:   Hypertension   CKD (chronic kidney disease), stage III (HCC)   Hyponatremia   COPD (chronic obstructive pulmonary disease) (HCC)   Malaise   Anorexia   Volume depletion   AKI (acute kidney injury) ([place])   Syncope  Subjective: Without complaints  Abtx:  Anti-infectives (From admission, onward)   Start     Dose/Rate Route Frequency Ordered Stop   09/13/17 1000  DAPTOmycin (CUBICIN) 400 mg in sodium chloride 0.9 % IVPB  Status:  Discontinued     6 mg/kg  66.7 kg 216 mL/hr over 30 Minutes Intravenous Every 24 hours 09/12/17 1553 09/15/17 1351   09/11/17 1700  vancomycin (VANCOCIN) IVPB 750 mg/150 ml premix  Status:  Discontinued     750 mg 150 mL/hr over 60 Minutes Intravenous Every 48 hours 09/09/17 1754 09/11/17 0840   09/11/17 1400  piperacillin-tazobactam (ZOSYN) IVPB 3.375 g     3.375 g 12.5 mL/hr over 240 Minutes Intravenous Every 8 hours 09/11/17 0840     09/11/17 1000  vancomycin (VANCOCIN) IVPB 1000 mg/200 mL premix  Status:  Discontinued     1,000 mg 200 mL/hr over 60 Minutes Intravenous Every 24 hours 09/11/17 0840 09/12/17 1541   09/10/17 1000  hydroxychloroquine (PLAQUENIL) tablet 200 mg     200 mg Oral Daily 09/09/17 2020     09/09/17 1700  piperacillin-tazobactam (ZOSYN) IVPB 2.25 g  Status:  Discontinued     2.25 g 100 mL/hr over 30 Minutes Intravenous Every 8 hours 09/09/17 1624 09/11/17 0840   09/09/17 1700  vancomycin (VANCOCIN) IVPB 750 mg/150 ml premix     750 mg 150 mL/hr over 60 Minutes Intravenous  Once 09/09/17 1624 09/09/17 1748      Medications:  Scheduled: . amitriptyline  25 mg Oral Daily  . amLODipine  10 mg Oral Daily  . aspirin EC  81 mg Oral QHS  . clopidogrel  75 mg Oral Daily  . enoxaparin (LOVENOX) injection  40 mg Subcutaneous Q24H  . finasteride  5 mg Oral Daily  . hydroxychloroquine  200 mg Oral Daily   . multivitamin with minerals  1 tablet Oral Daily  . pantoprazole  40 mg Oral BID  . predniSONE  5 mg Oral Q breakfast  . vitamin B-12  1,000 mcg Oral Daily    Objective: Vital signs in last 24 hours: Temp:  [98 F (36.7 C)-98.5 F (36.9 C)] 98 F (36.7 C) (12/10 0825) Pulse Rate:  [80-117] 117 (12/10 0825) Resp:  [18] 18 (12/10 0825) BP: (126-147)/(67-83) 145/67 (12/10 0825) SpO2:  [96 %-100 %] 96 % (12/10 0825)   General appearance: alert, cooperative and no distress Resp: clear to auscultation bilaterally Cardio: regular rate and rhythm GI: normal findings: bowel sounds normal and soft, non-tender Extremities: R foot dressed.   Lab Results Recent Labs    09/15/17 0404 09/16/17 0442  WBC 8.9 7.4  HGB 8.6* 9.0*  HCT 26.2* 27.4*  NA 136 136  K 3.2* 3.6  CL 104 104  CO2 24 26  BUN 10 9  CREATININE 1.26* 1.15   Liver Panel No results for input(s): PROT, ALBUMIN, AST, ALT, ALKPHOS, BILITOT, BILIDIR, IBILI in the last 72 hours. Sedimentation Rate No results for input(s): ESRSEDRATE in the last 72 hours. C-Reactive Protein No results for input(s): CRP in the last 72 hours.  Microbiology: Recent Results (  from the past 240 hour(s))  Culture, blood (Routine x 2)     Status: None   Collection Time: 09/09/17  3:16 PM  Result Value Ref Range Status   Specimen Description BLOOD RIGHT ANTECUBITAL  Final   Special Requests   Final    BOTTLES DRAWN AEROBIC AND ANAEROBIC Blood Culture adequate volume   Culture NO GROWTH 5 DAYS  Final   Report Status 09/14/2017 FINAL  Final  Culture, blood (Routine x 2)     Status: None   Collection Time: 09/09/17  3:29 PM  Result Value Ref Range Status   Specimen Description BLOOD LEFT ANTECUBITAL  Final   Special Requests   Final    BOTTLES DRAWN AEROBIC AND ANAEROBIC Blood Culture adequate volume   Culture NO GROWTH 5 DAYS  Final   Report Status 09/14/2017 FINAL  Final  Urine Culture     Status: Abnormal   Collection Time:  09/09/17  5:26 PM  Result Value Ref Range Status   Specimen Description URINE, RANDOM  Final   Special Requests NONE  Final   Culture <10,000 COLONIES/mL INSIGNIFICANT GROWTH (A)  Final   Report Status 09/10/2017 FINAL  Final  Aerobic Culture (superficial specimen)     Status: Abnormal   Collection Time: 09/09/17  8:18 PM  Result Value Ref Range Status   Specimen Description WOUND FOOT  Final   Special Requests AMPUTATION  Final   Gram Stain   Final    FEW WBC PRESENT, PREDOMINANTLY PMN RARE GRAM POSITIVE RODS    Culture MULTIPLE ORGANISMS PRESENT, NONE PREDOMINANT (A)  Final   Report Status 09/12/2017 FINAL  Final  Aerobic/Anaerobic Culture (surgical/deep wound)     Status: None (Preliminary result)   Collection Time: 09/13/17  5:30 PM  Result Value Ref Range Status   Specimen Description WOUND  Final   Special Requests DEEP SOFT TISSUE CENTRAL  Final   Gram Stain NO WBC SEEN NO ORGANISMS SEEN   Final   Culture   Final    NO GROWTH 3 DAYS NO ANAEROBES ISOLATED; CULTURE IN PROGRESS FOR 5 DAYS   Report Status PENDING  Incomplete  Acid Fast Smear (AFB)     Status: None   Collection Time: 09/13/17  5:30 PM  Result Value Ref Range Status   AFB Specimen Processing Comment  Final    Comment: Tissue Grinding and Digestion/Decontamination   Acid Fast Smear Negative  Final    Comment: (NOTE) Performed At: [facility] [place], [place] [id number] Rush Farmer MD KG:[id number]    Source (AFB) WOUND  Final    Comment: DEEP SOFT TISSUE CENTRAL  Aerobic/Anaerobic Culture (surgical/deep wound)     Status: None (Preliminary result)   Collection Time: 09/13/17  5:31 PM  Result Value Ref Range Status   Specimen Description WOUND  Final   Special Requests DEEP SOFT TISSUE INTERNAL  Final   Gram Stain   Final    RARE WBC PRESENT,BOTH PMN AND MONONUCLEAR NO ORGANISMS SEEN    Culture   Final    FEW ENTEROBACTER CLOACAE CRITICAL RESULT CALLED TO, READ BACK BY  AND VERIFIED WITH: A MABE,RN AT 1705 09/14/17 BY L BENFIELD CONCERNING GROWTH ON CULTURE NO ANAEROBES ISOLATED; CULTURE IN PROGRESS FOR 5 DAYS    Report Status PENDING  Incomplete   Organism ID, Bacteria ENTEROBACTER CLOACAE  Final      Susceptibility   Enterobacter cloacae - MIC*    CEFAZOLIN >=64 RESISTANT Resistant  CEFEPIME <=1 SENSITIVE Sensitive     CEFTAZIDIME <=1 SENSITIVE Sensitive     CEFTRIAXONE <=1 SENSITIVE Sensitive     CIPROFLOXACIN <=0.25 SENSITIVE Sensitive     GENTAMICIN <=1 SENSITIVE Sensitive     IMIPENEM <=0.25 SENSITIVE Sensitive     TRIMETH/SULFA <=20 SENSITIVE Sensitive     PIP/TAZO <=4 SENSITIVE Sensitive     * FEW ENTEROBACTER CLOACAE  Acid Fast Smear (AFB)     Status: None   Collection Time: 09/13/17  5:31 PM  Result Value Ref Range Status   AFB Specimen Processing Concentration  Final   Acid Fast Smear Negative  Final    Comment: (NOTE) Performed At: [facility] [place], [place] [id number] Rush Farmer MD UK:[id number]    Source (AFB) WOUND  Final    Comment: DEEP SOFT TISSUE INTERNAL  Aerobic/Anaerobic Culture (surgical/deep wound)     Status: None (Preliminary result)   Collection Time: 09/13/17  5:32 PM  Result Value Ref Range Status   Specimen Description WOUND  Final   Special Requests FIFTH METATARSAL BONE  Final   Gram Stain   Final    RARE WBC PRESENT, PREDOMINANTLY MONONUCLEAR NO ORGANISMS SEEN    Culture   Final    RARE ENTEROBACTER CLOACAE SUSCEPTIBILITIES PERFORMED ON PREVIOUS CULTURE WITHIN THE LAST 5 DAYS. NO ANAEROBES ISOLATED; CULTURE IN PROGRESS FOR 5 DAYS CRITICAL RESULT CALLED TO, READ BACK BY AND VERIFIED WITH: A MABE,RN AT 1705 09/14/17 BY L BENFIELD CONCERNING GROWTH ON CULTURE    Report Status PENDING  Incomplete  Acid Fast Smear (AFB)     Status: None   Collection Time: 09/13/17  5:32 PM  Result Value Ref Range Status   AFB Specimen Processing Comment  Final    Comment: Tissue Grinding and  Digestion/Decontamination   Acid Fast Smear Negative  Final    Comment: (NOTE) Performed At: [facility] [place], [place] [id number] Rush Farmer MD [id number]    Source (AFB) WOUND  Final    Comment: FIFTH METATARSAL BONE  Aerobic/Anaerobic Culture (surgical/deep wound)     Status: None (Preliminary result)   Collection Time: 09/13/17  5:33 PM  Result Value Ref Range Status   Specimen Description WOUND  Final   Special Requests THIRD METATARSAL BONE  Final   Gram Stain   Final    WBC PRESENT, PREDOMINANTLY PMN NO ORGANISMS SEEN    Culture   Final    RARE STAPHYLOCOCCUS AUREUS RARE GRAM NEGATIVE RODS IDENTIFICATION TO FOLLOW GROWTH ON CULTURE. CRITICAL VALUE NOTED.  VALUE IS CONSISTENT WITH PREVIOUSLY REPORTED AND CALLED VALUE.    Report Status PENDING  Incomplete  Acid Fast Smear (AFB)     Status: None   Collection Time: 09/13/17  5:33 PM  Result Value Ref Range Status   AFB Specimen Processing Comment  Final    Comment: Tissue Grinding and Digestion/Decontamination   Acid Fast Smear Negative  Final    Comment: (NOTE) Performed At: [facility] [place], [place] [id number] Rush Farmer MD JJ:[id number]    Source (AFB) WOUND  Final    Comment: THIRD METATARSAL BONE  Aerobic/Anaerobic Culture (surgical/deep wound)     Status: None (Preliminary result)   Collection Time: 09/13/17  5:41 PM  Result Value Ref Range Status   Specimen Description WOUND  Final   Special Requests FIRST METATARSAL BONE  Final   Gram Stain   Final    RARE WBC PRESENT, PREDOMINANTLY PMN NO  ORGANISMS SEEN    Culture   Final    RARE DIPHTHEROIDS(CORYNEBACTERIUM SPECIES) Standardized susceptibility testing for this organism is not available. NO ANAEROBES ISOLATED; CULTURE IN PROGRESS FOR 5 DAYS GROWTH ON CULTURE. CRITICAL VALUE NOTED.  VALUE IS CONSISTENT WITH PREVIOUSLY REPORTED AND CALLED VALUE.    Report Status PENDING  Incomplete  Acid  Fast Smear (AFB)     Status: None   Collection Time: 09/13/17  5:41 PM  Result Value Ref Range Status   AFB Specimen Processing Concentration  Final   Acid Fast Smear Negative  Final    Comment: (NOTE) Performed At: [facility] [place], [place] [id number] Rush Farmer MD VG:[id number]    Source (AFB) WOUND  Final    Comment: FIRST METATARSAL BONE    Studies/Results: No results found.   Assessment/Plan: Osteomyelitis R foot PVD CKD  Total days of antibiotics: 7 zosyn                                           Cx shows E cloacae (R- ancef)     Would give him ceftriaxone for 6 weeks.                 Appreciate WOC eval.  Has PIC now Cr improved BCx 12-3 are negative  Diagnosis: Osteomyelitis R foot.   Culture Result: E cloacae  Allergies  Allergen Reactions  . Ace Inhibitors Other (See Comments)    Reaction:  Unknown     OPAT Orders Discharge antibiotics: Ceftriaxone 2 g ivpb qday Duration: 35 days End Date: 10-22-17  [facility] Care Per Protocol: yes  Labs weekly while on IV antibiotics: _x_ CBC with differential __ BMP _x_ CMP __ CRP __ ESR __ Vancomycin trough  _x_ Please pull PIC at completion of IV antibiotics __ Please leave PIC in place until doctor has seen patient or been notified  Fax weekly labs to [phone]  Clinic Follow Up Appt: Deasha Clendenin 4-5 weeks.         Bobby Rumpf MD, FACP Infectious Diseases (pager) [phone] [email or web address] 09/16/2017, 2:12 PM  LOS: 5 days

## 2017-09-16 NOTE — Progress Notes (Signed)
PHARMACY CONSULT NOTE FOR:  OUTPATIENT  PARENTERAL ANTIBIOTIC THERAPY (OPAT)  Indication: R-foot osteomyelitis Regimen: Rocephin 2g IV every 24 hours End date: 10/22/17  IV antibiotic discharge orders are pended. To discharging provider:  please sign these orders via discharge navigator,  Select New Orders & click on the button choice - Manage This Unsigned Work.    Thank you for allowing pharmacy to be a part of this patient's care.  Lawson Radar 09/16/2017, 2:42 PM

## 2017-09-16 NOTE — Evaluation (Signed)
Physical Therapy Evaluation Patient Details Name: Derrick [name] MRN: [id number] DOB: July 10, 1932 Today's Date: 09/16/2017   History of Present Illness  Pt is an 83 y/o male admitted secondary to Hypotension, R transmetatarsal wound infection. PMH includes R transmetatarsal amputation, COPD, CKD, HTN, PAD, prostate cancer, and s/p nephrectomy.  Clinical Impression  Patient seen for activity progression. Improvements in mobility and activity tolerance noted. Patient continues to have some difficulty with awareness. HHPT and current poc remains appropriate.    Follow Up Recommendations Home health PT;Supervision/Assistance - 24 hour    Equipment Recommendations  3in1 (PT)    Recommendations for Other Services       Precautions / Restrictions Precautions Precautions: Fall Restrictions Weight Bearing Restrictions: No      Mobility  Bed Mobility Overal bed mobility: Needs Assistance Bed Mobility: Supine to Sit     Supine to sit: Supervision     General bed mobility comments: Supervision for safety, no assist needed  Transfers Overall transfer level: Needs assistance Equipment used: Rolling walker (2 wheeled) Transfers: Sit to/from Stand Sit to Stand: Min guard         General transfer comment: min guard for safety  Ambulation/Gait Ambulation/Gait assistance: Min guard Ambulation Distance (Feet): 150 Feet Assistive device: Rolling walker (2 wheeled) Gait Pattern/deviations: Decreased step length - right;Decreased step length - left;Decreased stride length;Step-through pattern Gait velocity: Decreased Gait velocity interpretation: Below normal speed for age/gender General Gait Details: Decreased cadence, continues to require cues for position within Rw. one standing rest break.   Stairs            Wheelchair Mobility    Modified Rankin (Stroke Patients Only)       Balance Overall balance assessment: Needs assistance Sitting-balance support: No upper  extremity supported;Feet supported Sitting balance-Leahy Scale: Good     Standing balance support: Bilateral upper extremity supported;During functional activity Standing balance-Leahy Scale: Poor Standing balance comment: Reliant on BUE and external support for balance.                              Pertinent Vitals/Pain Pain Assessment: Faces Faces Pain Scale: Hurts a little bit Pain Location: R LE  Pain Descriptors / Indicators: Sore Pain Intervention(s): Monitored during session    Home Living                        Prior Function                 Hand Dominance        Extremity/Trunk Assessment                Communication      Cognition Arousal/Alertness: Awake/alert Behavior During Therapy: WFL for tasks assessed/performed Overall Cognitive Status: Impaired/Different from baseline Area of Impairment: Memory;Safety/judgement;Problem solving                     Memory: Decreased short-term memory   Safety/Judgement: Decreased awareness of deficits;Decreased awareness of safety   Problem Solving: Difficulty sequencing;Requires verbal cues General Comments: Patient with decreased awareness of fall risk as well as vac management, kept tipping walker back in forth while seated with Vac on it not realizing that he was tipping the vac      General Comments General comments (skin integrity, edema, etc.): educated patient on BLE exercises to perform in chair (LAQs, Ankle pumps, Marching)    Exercises  Assessment/Plan    PT Assessment    PT Problem List         PT Treatment Interventions      PT Goals (Current goals can be found in the Care Plan section)  Acute Rehab PT Goals Patient Stated Goal: to go home  PT Goal Formulation: With patient Time For Goal Achievement: 09/25/17 Potential to Achieve Goals: Good    Frequency Min 3X/week   Barriers to discharge        Co-evaluation               AM-PAC  PT "6 Clicks" Daily Activity  Outcome Measure Difficulty turning over in bed (including adjusting bedclothes, sheets and blankets)?: None Difficulty moving from lying on back to sitting on the side of the bed? : None Difficulty sitting down on and standing up from a chair with arms (e.g., wheelchair, bedside commode, etc,.)?: Unable Help needed moving to and from a bed to chair (including a wheelchair)?: A Little Help needed walking in hospital room?: A Little Help needed climbing 3-5 steps with a railing? : A Lot 6 Click Score: 17    End of Session Equipment Utilized During Treatment: Gait belt Activity Tolerance: Patient tolerated treatment well Patient left: in chair;with call bell/phone within reach(patient not alarmed upon entering room per request) Nurse Communication: Mobility status PT Visit Diagnosis: Unsteadiness on feet (R26.81);Other abnormalities of gait and mobility (R26.89);Difficulty in walking, not elsewhere classified (R26.2);Pain Pain - Right/Left: Right Pain - part of body: Ankle and joints of foot    Time: 4627-0350 PT Time Calculation (min) (ACUTE ONLY): 20 min   Charges:     PT Treatments $Gait Training: 8-22 mins   PT G Codes:        Alben Deeds, PT DPT  Board Certified Neurologic Specialist [place] 09/16/2017, 10:16 AM

## 2017-09-16 NOTE — Progress Notes (Addendum)
PROGRESS NOTE    Derrick [name]  EXH:[id number] DOB: 12-06-1931 DOA: 09/09/2017 PCP: [name], MD     Brief Narrative:  Derrick [name] a 83 y.o.malewith hx of COPD, mild CKD, HL, HTN, prostate cancer, hx RCCa sp nephrectomy presented to ED w/ recurrent syncope. In ED, BP was in the 70's, BP's improved after NS bolus to 90's-100's and pt feeling better. Patient was admitted due to syncope and AKI.   In August pt had paronychia of the R great toe which was complicated by infection then gangrene. He underwent angiogram right legAug 23rd by IR Dr [name] w/ balloon PTA to the ant tibial artery. R PT artery couldn't be crossed due to calcification. He got worse and underwent R 1/2 toe amputation on 06/13/17 by Dr. [name]. He then had another IR procedure by Dr [name] on Nov 1st with PTA to prox AT and PT. They were unable to treat the distal tibial vessels/pedal vesselsdue to "dense coral-reef calcification". The foot progressed and he was admitted and underwent R TMA on 08/14/17, again by Dr [name]. He has had persistent pain in the R foot / TMA stump and was taking percocet but said itwasn't working so was changed to po dilaudid. He was started recently on oral abx, cipro and clindamycin and has been taking these.   Since his hospitalization, blood pressure and AKI continued to improve. He was taken for debridement, revision transmetatarsal amputation with wound vac placement on 12/7 with Dr. [name].   Assessment & Plan:   Principal Problem:   Hypotension Active Problems:   Hypertension   CKD (chronic kidney disease), stage III (HCC)   Hyponatremia   COPD (chronic obstructive pulmonary disease) (HCC)   Malaise   Anorexia   Volume depletion   AKI (acute kidney injury) ([name])   Syncope   Syncope -Likely secondary to dehydration from nausea and poor oral intake secondary to possible wound infection -Repeat orthostatic VS improved. No further syncopal events. BP stable.      Right transmetarsal wound infection -Has been on several outpatient oral antibiotics and has failed (per pharmacy: doxycycline 9/17, keflex 11/7, clindamycin 11/12, augmentin 11/20, bactrim 11/20, and cipro 11/30)  -Patient had osteomyelitis and gangrene of right toes with nonhealing surgical wounds secondary to PAD s/p transmetatarsal amputation November 2018 -Blood cultures are negative  -Wound culture with multiple organism  -Podiatry consulted and following. Patient to follow up in office 12/11 and 12/13  -Foot xray revealed large soft tissue ulceration overlying distal portion residual fifth metatarsal with possible lytic destruction suggesting osteomyelitis -S/p revision transmetatarsal amputation, debridement, wound vac placement 12/7 with Dr. [name]. Remove vac 12/10, discussed with Dr. [name]  -Deep soft tissue culture showed enterobacter cloacae, pending final culture  -ID following, on zosyn. PICC in place. Awaiting final antibiotic recommendation for discharge   Acute kidney injury on chronic kidney injury, stage III -Secondary to dehydration and hypotension -Resolved Cr normal now   Normocytic anemia -Drop in hemoglobin likely dilutional, Hgb remained stable   PAD  -With recently RLE revascularization with IR  -Continue aspirin, Plavix  Essential hypertension -Medications held secondary to to hypotension and syncope. Resume norvasc today   GERD -Continue PPI   DVT prophylaxis: lovenox Code Status: full Family Communication: no family at bedside Disposition Plan: pending culture results and antibiotic plan. Home with [facility]    Consultants:   Podiatry   ID   Procedures:   S/p revision transmetatarsal amputation, debridement, wound vac placement 12/7  with Dr. [name]  Antimicrobials:  Anti-infectives (From admission, onward)   Start     Dose/Rate Route Frequency Ordered Stop   09/13/17 1000  DAPTOmycin (CUBICIN) 400 mg in sodium chloride 0.9 % IVPB  Status:   Discontinued     6 mg/kg  66.7 kg 216 mL/hr over 30 Minutes Intravenous Every 24 hours 09/12/17 1553 09/15/17 1351   09/11/17 1700  vancomycin (VANCOCIN) IVPB 750 mg/150 ml premix  Status:  Discontinued     750 mg 150 mL/hr over 60 Minutes Intravenous Every 48 hours 09/09/17 1754 09/11/17 0840   09/11/17 1400  piperacillin-tazobactam (ZOSYN) IVPB 3.375 g     3.375 g 12.5 mL/hr over 240 Minutes Intravenous Every 8 hours 09/11/17 0840     09/11/17 1000  vancomycin (VANCOCIN) IVPB 1000 mg/200 mL premix  Status:  Discontinued     1,000 mg 200 mL/hr over 60 Minutes Intravenous Every 24 hours 09/11/17 0840 09/12/17 1541   09/10/17 1000  hydroxychloroquine (PLAQUENIL) tablet 200 mg     200 mg Oral Daily 09/09/17 2020     09/09/17 1700  piperacillin-tazobactam (ZOSYN) IVPB 2.25 g  Status:  Discontinued     2.25 g 100 mL/hr over 30 Minutes Intravenous Every 8 hours 09/09/17 1624 09/11/17 0840   09/09/17 1700  vancomycin (VANCOCIN) IVPB 750 mg/150 ml premix     750 mg 150 mL/hr over 60 Minutes Intravenous  Once 09/09/17 1624 09/09/17 1748       Subjective: Patient without new complaints today. Doing well. No acute events    Objective: Vitals:   09/15/17 1722 09/15/17 2128 09/16/17 0416 09/16/17 0825  BP: 132/72 (!) 147/67 126/83 (!) 145/67  Pulse: 97 (!) 107 80 (!) 117  Resp: 18 18 18 18   Temp: 98.4 F (36.9 C) 98.3 F (36.8 C) 98.5 F (36.9 C) 98 F (36.7 C)  TempSrc: Oral Oral Oral Oral  SpO2: 98% 96% 100% 96%  Weight:      Height:        Intake/Output Summary (Last 24 hours) at 09/16/2017 1102 Last data filed at 09/16/2017 0416 Gross per 24 hour  Intake 360 ml  Output 1650 ml  Net -1290 ml   Filed Weights   09/13/17 2125 09/14/17 0440 09/14/17 2142  Weight: 66.9 kg (147 lb 7.8 oz) 66.9 kg (147 lb 7.8 oz) 66.9 kg (147 lb 7.8 oz)    Examination:  General exam: Appears calm and comfortable  Respiratory system: Clear to auscultation. Respiratory effort  normal. Cardiovascular system: S1 & S2 heard, RRR. No JVD, murmurs, rubs, gallops or clicks. No pedal edema. Gastrointestinal system: Abdomen is nondistended, soft and nontender. No organomegaly or masses felt. Normal bowel sounds heard. Central nervous system: Alert and oriented. No focal neurological deficits. Extremities: +Right foot s/p transmetatarsal amputation, dressing clean, wound vac in place  Psychiatry: Judgement and insight appear normal. Mood & affect appropriate.   Data Reviewed: I have personally reviewed following labs and imaging studies  CBC: Recent Labs  Lab 09/09/17 1503  09/12/17 0535 09/13/17 0347 09/14/17 0410 09/15/17 0404 09/16/17 0442  WBC 11.3*   < > 6.1 7.1 8.2 8.9 7.4  NEUTROABS 7.2  --   --   --   --   --   --   HGB 10.9*   < > 9.1* 9.2* 9.5* 8.6* 9.0*  HCT 32.2*   < > 27.7* 28.1* 28.5* 26.2* 27.4*  MCV 85.2   < > 86.3 85.9 87.4 86.5 88.1  PLT  337   < > 211 225 230 181 185   < > = values in this interval not displayed.   Basic Metabolic Panel: Recent Labs  Lab 09/12/17 0535 09/13/17 0347 09/14/17 0410 09/15/17 0404 09/16/17 0442  NA 135 137 137 136 136  K 3.5 3.5 3.7 3.2* 3.6  CL 104 106 105 104 104  CO2 22 24 21* 24 26  GLUCOSE 85 94 116* 100* 91  BUN 11 8 8 10 9   CREATININE 1.31* 1.33* 1.16 1.26* 1.15  CALCIUM 8.7* 8.8* 8.9 8.4* 8.7*  MG  --   --   --   --  1.7   GFR: Estimated Creatinine Clearance: 46.9 mL/min (by C-G formula based on SCr of 1.15 mg/dL). Liver Function Tests: Recent Labs  Lab 09/09/17 1503  AST 23  ALT 21  ALKPHOS 51  BILITOT 0.8  PROT 6.4*  ALBUMIN 3.5   No results for input(s): LIPASE, AMYLASE in the last 168 hours. No results for input(s): AMMONIA in the last 168 hours. Coagulation Profile: Recent Labs  Lab 09/09/17 1503  INR 1.10   Cardiac Enzymes: Recent Labs  Lab 09/12/17 1616 09/13/17 0347  CKTOTAL 47* 45*   BNP (last 3 results) No results for input(s): PROBNP in the last 8760  hours. HbA1C: No results for input(s): HGBA1C in the last 72 hours. CBG: No results for input(s): GLUCAP in the last 168 hours. Lipid Profile: No results for input(s): CHOL, HDL, LDLCALC, TRIG, CHOLHDL, LDLDIRECT in the last 72 hours. Thyroid Function Tests: No results for input(s): TSH, T4TOTAL, FREET4, T3FREE, THYROIDAB in the last 72 hours. Anemia Panel: No results for input(s): VITAMINB12, FOLATE, FERRITIN, TIBC, IRON, RETICCTPCT in the last 72 hours. Sepsis Labs: Recent Labs  Lab 09/09/17 1508 09/09/17 1750  LATICACIDVEN 2.38* 1.65    Recent Results (from the past 240 hour(s))  Culture, blood (Routine x 2)     Status: None   Collection Time: 09/09/17  3:16 PM  Result Value Ref Range Status   Specimen Description BLOOD RIGHT ANTECUBITAL  Final   Special Requests   Final    BOTTLES DRAWN AEROBIC AND ANAEROBIC Blood Culture adequate volume   Culture NO GROWTH 5 DAYS  Final   Report Status 09/14/2017 FINAL  Final  Culture, blood (Routine x 2)     Status: None   Collection Time: 09/09/17  3:29 PM  Result Value Ref Range Status   Specimen Description BLOOD LEFT ANTECUBITAL  Final   Special Requests   Final    BOTTLES DRAWN AEROBIC AND ANAEROBIC Blood Culture adequate volume   Culture NO GROWTH 5 DAYS  Final   Report Status 09/14/2017 FINAL  Final  Urine Culture     Status: Abnormal   Collection Time: 09/09/17  5:26 PM  Result Value Ref Range Status   Specimen Description URINE, RANDOM  Final   Special Requests NONE  Final   Culture <10,000 COLONIES/mL INSIGNIFICANT GROWTH (A)  Final   Report Status 09/10/2017 FINAL  Final  Aerobic Culture (superficial specimen)     Status: Abnormal   Collection Time: 09/09/17  8:18 PM  Result Value Ref Range Status   Specimen Description WOUND FOOT  Final   Special Requests AMPUTATION  Final   Gram Stain   Final    FEW WBC PRESENT, PREDOMINANTLY PMN RARE GRAM POSITIVE RODS    Culture MULTIPLE ORGANISMS PRESENT, NONE PREDOMINANT (A)   Final   Report Status 09/12/2017 FINAL  Final  Aerobic/Anaerobic Culture (  surgical/deep wound)     Status: None (Preliminary result)   Collection Time: 09/13/17  5:30 PM  Result Value Ref Range Status   Specimen Description WOUND  Final   Special Requests DEEP SOFT TISSUE CENTRAL  Final   Gram Stain NO WBC SEEN NO ORGANISMS SEEN   Final   Culture   Final    NO GROWTH 3 DAYS NO ANAEROBES ISOLATED; CULTURE IN PROGRESS FOR 5 DAYS   Report Status PENDING  Incomplete  Acid Fast Smear (AFB)     Status: None   Collection Time: 09/13/17  5:30 PM  Result Value Ref Range Status   AFB Specimen Processing Comment  Final    Comment: Tissue Grinding and Digestion/Decontamination   Acid Fast Smear Negative  Final    Comment: (NOTE) Performed At: [facility] [place], [place] [id number] Rush Farmer MD [id number]    Source (AFB) WOUND  Final    Comment: DEEP SOFT TISSUE CENTRAL  Aerobic/Anaerobic Culture (surgical/deep wound)     Status: None (Preliminary result)   Collection Time: 09/13/17  5:31 PM  Result Value Ref Range Status   Specimen Description WOUND  Final   Special Requests DEEP SOFT TISSUE INTERNAL  Final   Gram Stain   Final    RARE WBC PRESENT,BOTH PMN AND MONONUCLEAR NO ORGANISMS SEEN    Culture   Final    FEW ENTEROBACTER CLOACAE CRITICAL RESULT CALLED TO, READ BACK BY AND VERIFIED WITH: A MABE,RN AT 1705 09/14/17 BY L BENFIELD CONCERNING GROWTH ON CULTURE NO ANAEROBES ISOLATED; CULTURE IN PROGRESS FOR 5 DAYS    Report Status PENDING  Incomplete   Organism ID, Bacteria ENTEROBACTER CLOACAE  Final      Susceptibility   Enterobacter cloacae - MIC*    CEFAZOLIN >=64 RESISTANT Resistant     CEFEPIME <=1 SENSITIVE Sensitive     CEFTAZIDIME <=1 SENSITIVE Sensitive     CEFTRIAXONE <=1 SENSITIVE Sensitive     CIPROFLOXACIN <=0.25 SENSITIVE Sensitive     GENTAMICIN <=1 SENSITIVE Sensitive     IMIPENEM <=0.25 SENSITIVE Sensitive     TRIMETH/SULFA  <=20 SENSITIVE Sensitive     PIP/TAZO <=4 SENSITIVE Sensitive     * FEW ENTEROBACTER CLOACAE  Acid Fast Smear (AFB)     Status: None   Collection Time: 09/13/17  5:31 PM  Result Value Ref Range Status   AFB Specimen Processing Concentration  Final   Acid Fast Smear Negative  Final    Comment: (NOTE) Performed At: [facility] [place], [place] [id number] Rush Farmer MD EV:[id number]    Source (AFB) WOUND  Final    Comment: DEEP SOFT TISSUE INTERNAL  Aerobic/Anaerobic Culture (surgical/deep wound)     Status: None (Preliminary result)   Collection Time: 09/13/17  5:32 PM  Result Value Ref Range Status   Specimen Description WOUND  Final   Special Requests FIFTH METATARSAL BONE  Final   Gram Stain   Final    RARE WBC PRESENT, PREDOMINANTLY MONONUCLEAR NO ORGANISMS SEEN    Culture   Final    RARE ENTEROBACTER CLOACAE SUSCEPTIBILITIES PERFORMED ON PREVIOUS CULTURE WITHIN THE LAST 5 DAYS. NO ANAEROBES ISOLATED; CULTURE IN PROGRESS FOR 5 DAYS CRITICAL RESULT CALLED TO, READ BACK BY AND VERIFIED WITH: A MABE,RN AT 1705 09/14/17 BY L BENFIELD CONCERNING GROWTH ON CULTURE    Report Status PENDING  Incomplete  Acid Fast Smear (AFB)     Status: None   Collection Time: 09/13/17  5:32 PM  Result Value Ref Range Status   AFB Specimen Processing Comment  Final    Comment: Tissue Grinding and Digestion/Decontamination   Acid Fast Smear Negative  Final    Comment: (NOTE) Performed At: [facility] [place], [place] [id number] Rush Farmer MD JQ:[id number]    Source (AFB) WOUND  Final    Comment: FIFTH METATARSAL BONE  Aerobic/Anaerobic Culture (surgical/deep wound)     Status: None (Preliminary result)   Collection Time: 09/13/17  5:33 PM  Result Value Ref Range Status   Specimen Description WOUND  Final   Special Requests THIRD METATARSAL BONE  Final   Gram Stain   Final    WBC PRESENT, PREDOMINANTLY PMN NO ORGANISMS SEEN    Culture    Final    RARE STAPHYLOCOCCUS AUREUS RARE GRAM NEGATIVE RODS IDENTIFICATION TO FOLLOW GROWTH ON CULTURE. CRITICAL VALUE NOTED.  VALUE IS CONSISTENT WITH PREVIOUSLY REPORTED AND CALLED VALUE.    Report Status PENDING  Incomplete  Acid Fast Smear (AFB)     Status: None   Collection Time: 09/13/17  5:33 PM  Result Value Ref Range Status   AFB Specimen Processing Comment  Final    Comment: Tissue Grinding and Digestion/Decontamination   Acid Fast Smear Negative  Final    Comment: (NOTE) Performed At: [facility] [place], [place] [id number] Rush Farmer MD JM:[id number]    Source (AFB) WOUND  Final    Comment: THIRD METATARSAL BONE  Aerobic/Anaerobic Culture (surgical/deep wound)     Status: None (Preliminary result)   Collection Time: 09/13/17  5:41 PM  Result Value Ref Range Status   Specimen Description WOUND  Final   Special Requests FIRST METATARSAL BONE  Final   Gram Stain   Final    RARE WBC PRESENT, PREDOMINANTLY PMN NO ORGANISMS SEEN    Culture   Final    RARE DIPHTHEROIDS(CORYNEBACTERIUM SPECIES) Standardized susceptibility testing for this organism is not available. NO ANAEROBES ISOLATED; CULTURE IN PROGRESS FOR 5 DAYS GROWTH ON CULTURE. CRITICAL VALUE NOTED.  VALUE IS CONSISTENT WITH PREVIOUSLY REPORTED AND CALLED VALUE.    Report Status PENDING  Incomplete  Acid Fast Smear (AFB)     Status: None   Collection Time: 09/13/17  5:41 PM  Result Value Ref Range Status   AFB Specimen Processing Concentration  Final   Acid Fast Smear Negative  Final    Comment: (NOTE) Performed At: [facility] [place], [place] [id number] Rush Farmer MD JH:[id number]    Source (AFB) WOUND  Final    Comment: FIRST METATARSAL BONE       Radiology Studies: No results found.    Scheduled Meds: . amitriptyline  25 mg Oral Daily  . aspirin EC  81 mg Oral QHS  . clopidogrel  75 mg Oral Daily  . enoxaparin (LOVENOX) injection   40 mg Subcutaneous Q24H  . finasteride  5 mg Oral Daily  . hydroxychloroquine  200 mg Oral Daily  . multivitamin with minerals  1 tablet Oral Daily  . pantoprazole  40 mg Oral BID  . predniSONE  5 mg Oral Q breakfast  . vitamin B-12  1,000 mcg Oral Daily   Continuous Infusions: . piperacillin-tazobactam (ZOSYN)  IV 3.375 g (09/16/17 0626)     LOS: 5 days    Time spent: 30 minutes   Dessa Phi, DO [facility] [email or web address] Password [email or web address] 09/16/2017, 11:02 AM

## 2017-09-17 LAB — CBC
HEMATOCRIT: 28.8 % — AB (ref 39.0–52.0)
HEMOGLOBIN: 9.4 g/dL — AB (ref 13.0–17.0)
MCH: 28.7 pg (ref 26.0–34.0)
MCHC: 32.6 g/dL (ref 30.0–36.0)
MCV: 88.1 fL (ref 78.0–100.0)
Platelets: 188 10*3/uL (ref 150–400)
RBC: 3.27 MIL/uL — ABNORMAL LOW (ref 4.22–5.81)
RDW: 16 % — AB (ref 11.5–15.5)
WBC: 5.8 10*3/uL (ref 4.0–10.5)

## 2017-09-17 LAB — BASIC METABOLIC PANEL
Anion gap: 10 (ref 5–15)
BUN: 10 mg/dL (ref 6–20)
CALCIUM: 9 mg/dL (ref 8.9–10.3)
CHLORIDE: 104 mmol/L (ref 101–111)
CO2: 22 mmol/L (ref 22–32)
CREATININE: 1.19 mg/dL (ref 0.61–1.24)
GFR calc Af Amer: >60 mL/min (ref >60)
GFR calc non Af Amer: 55 mL/min — ABNORMAL LOW (ref >60)
GLUCOSE: 87 mg/dL (ref 65–99)
Potassium: 3.4 mmol/L — ABNORMAL LOW (ref 3.5–5.1)
Sodium: 136 mmol/L (ref 135–145)

## 2017-09-17 MED ORDER — CEFTRIAXONE IV (FOR PTA / DISCHARGE USE ONLY): 2.0000 g | INTRAVENOUS | 0 refills | Status: DC

## 2017-09-17 MED ORDER — HEPARIN SOD (PORK) LOCK FLUSH 100 UNIT/ML IV SOLN
250.0000 [IU] | INTRAVENOUS | Status: AC | PRN
  Administered 2017-09-17: 16:00:00

## 2017-09-17 MED ORDER — POTASSIUM CHLORIDE CRYS ER 20 MEQ PO TBCR
40.0000 meq | EXTENDED_RELEASE_TABLET | Freq: Once | ORAL | Status: AC
  Administered 2017-09-17: 40 meq via ORAL
  Filled 2017-09-17: qty 2

## 2017-09-17 MED ORDER — SODIUM CHLORIDE 0.9% FLUSH: 10.0000 mL | INTRAVENOUS | Status: DC | PRN

## 2017-09-17 MED ORDER — HEPARIN SOD (PORK) LOCK FLUSH 100 UNIT/ML IV SOLN: 250.0000 [IU] | INTRAVENOUS | Status: DC | PRN

## 2017-09-17 NOTE — Progress Notes (Signed)
Physical Therapy Treatment Patient Details Name: Derrick [name] MRN: [id number] DOB: [birth year] Today's Date: 09/17/2017    History of Present Illness Pt is an 83 y/o male admitted secondary to Hypotension, R transmetatarsal wound infection. PMH includes R transmetatarsal amputation, COPD, CKD, HTN, PAD, prostate cancer, and s/p nephrectomy.    PT Comments    Pt confused during treatment today, not oriented to time, upset because he thought he was going to leave today and the day has gone by and his wife has not come. However, it was still morningtime. Unclear how he is cognitively at baseline. Ambulated 74' with RW and min-guard A, took seated rest and ambulated 42' more. Poor safety awareness, recommend supervision for mobility. PT will continue to follow.    Follow Up Recommendations  Home health PT;Supervision/Assistance - 24 hour     Equipment Recommendations  3in1 (PT)    Recommendations for Other Services       Precautions / Restrictions Precautions Precautions: Fall Restrictions Weight Bearing Restrictions: No    Mobility  Bed Mobility Overal bed mobility: Needs Assistance Bed Mobility: Supine to Sit;Sit to Supine     Supine to sit: Supervision Sit to supine: Supervision   General bed mobility comments: Supervision for safety, no assist needed  Transfers Overall transfer level: Needs assistance Equipment used: Rolling walker (2 wheeled) Transfers: Sit to/from Stand Sit to Stand: Min guard         General transfer comment: min guard for safety  Ambulation/Gait Ambulation/Gait assistance: Min assist Ambulation Distance (Feet): 80 Feet(2x) Assistive device: Rolling walker (2 wheeled) Gait Pattern/deviations: Decreased step length - right;Decreased weight shift to right Gait velocity: decreased Gait velocity interpretation: <1.8 ft/sec, indicative of risk for recurrent falls General Gait Details: seated rest break after 80', pt expressed discomfort R foot  when ambulating   Stairs            Wheelchair Mobility    Modified Rankin (Stroke Patients Only)       Balance Overall balance assessment: Needs assistance Sitting-balance support: No upper extremity supported;Feet supported Sitting balance-Leahy Scale: Good     Standing balance support: Bilateral upper extremity supported;During functional activity Standing balance-Leahy Scale: Poor Standing balance comment: Reliant on BUE and external support for balance.                             Cognition Arousal/Alertness: Awake/alert Behavior During Therapy: WFL for tasks assessed/performed Overall Cognitive Status: Impaired/Different from baseline Area of Impairment: Memory;Safety/judgement;Problem solving                     Memory: Decreased short-term memory   Safety/Judgement: Decreased awareness of deficits;Decreased awareness of safety   Problem Solving: Difficulty sequencing;Requires verbal cues General Comments: not oriented to time, pulled PICC line out last night. Could not give me much info about PLOF. Was able to give his address only when asked how he wold put a return address on an envelope. Unsure how much of this is baseline for him.       Exercises General Exercises - Lower Extremity Ankle Circles/Pumps: AROM;Both;10 reps;Seated Long Arc Quad: AROM;Both;10 reps;Seated    General Comments        Pertinent Vitals/Pain Pain Assessment: Faces Faces Pain Scale: Hurts little more Pain Location: R LE  Pain Descriptors / Indicators: Sore(swollen) Pain Intervention(s): Limited activity within patient's tolerance;Monitored during session    Home Living  Prior Function            PT Goals (current goals can now be found in the care plan section) Acute Rehab PT Goals Patient Stated Goal: to go home  PT Goal Formulation: With patient Time For Goal Achievement: 09/25/17 Potential to Achieve Goals:  Good Progress towards PT goals: Progressing toward goals    Frequency    Min 3X/week      PT Plan Current plan remains appropriate    Co-evaluation              AM-PAC PT "6 Clicks" Daily Activity  Outcome Measure  Difficulty turning over in bed (including adjusting bedclothes, sheets and blankets)?: None Difficulty moving from lying on back to sitting on the side of the bed? : None Difficulty sitting down on and standing up from a chair with arms (e.g., wheelchair, bedside commode, etc,.)?: Unable Help needed moving to and from a bed to chair (including a wheelchair)?: A Little Help needed walking in hospital room?: A Little Help needed climbing 3-5 steps with a railing? : A Lot 6 Click Score: 17    End of Session Equipment Utilized During Treatment: Gait belt Activity Tolerance: Patient tolerated treatment well Patient left: in bed;with call bell/phone within reach;with bed alarm set Nurse Communication: Mobility status PT Visit Diagnosis: Unsteadiness on feet (R26.81);Other abnormalities of gait and mobility (R26.89);Difficulty in walking, not elsewhere classified (R26.2);Pain Pain - Right/Left: Right Pain - part of body: Ankle and joints of foot     Time: 6270-3500 PT Time Calculation (min) (ACUTE ONLY): 17 min  Charges:  $Gait Training: 8-22 mins                    G Codes:       Leighton Roach, PT  Acute Rehab Services  [place] 09/17/2017, 11:56 AM

## 2017-09-17 NOTE — Care Management Note (Signed)
Case Management Note  Patient Details  Name: Derrick [name] MRN: [id number] Date of Birth: 1931/10/30  Subjective/Objective:     CM following for progression and d/c planning.                Action/Plan: 09/17/2017   Pt pulled PICC out overnight due to confusion. Pt  Remains very coorpative today , awaiting PICC to be replaced and [facility] IV RN available to work with pt and family re IV antibiotic administration.    Expected Discharge Date:  09/17/17               Expected Discharge Plan:  [place]  In-House Referral:  NA  Discharge planning Services  CM Consult  Post Acute Care Choice:    Choice offered to:     DME Arranged:  IV pump/equipment DME Agency:  [place] Arranged:  RN, PT [facility] Agency:  [facility]  Status of Service:  In process, will continue to follow  If discussed at Long Length of Stay Meetings, dates discussed:    Additional Comments:  Adron Bene, RN 09/17/2017, 11:07 AM

## 2017-09-17 NOTE — Discharge Summary (Signed)
Physician Discharge Summary  Derrick [name] HBZ:[id number] DOB: 08/21/1932 DOA: 09/09/2017  PCP: [name], MD  Admit date: 09/09/2017 Discharge date: 09/17/2017  Admitted From: Home Disposition:  Home   Recommendations for Outpatient Follow-up:  1. Follow up with Dr. [name] in 2 days  2. Please obtain CMP/CBC in 1 week   3. Final wound culture results 4. Continue Rocephin IV 2g daily until 10/22/2017. CBC and CMP weekly while on IV antibiotics. Pull PICC at completion of antibiotics.  5. Losartan-HCTZ held during hospitalization due to hypotension and AKI. Once stable, could consider resuming as outpatient.   Home Health: PT OT RN  Equipment/Devices: 3in1   Discharge Condition: Stable CODE STATUS: Full  Diet recommendation: Heart healthy   Brief/Interim Summary: From H&P: Derrick [name] a 83 y.o.malewith hx of COPD, mild CKD, HL, HTN, prostate cancer, hx RCCa sp nephrectomy presented to ED w/ recurrent syncope. In ED, BP was in the 70's, BP's improved after NS bolus to 90's-100's and pt feeling better. Patient was admitted due to syncope and AKI.   In August pt had paronychia of the R great toe which was complicated by infection then gangrene. He underwent angiogram right legAug 23rd by IR Dr [name] w/ balloon PTA to the ant tibial artery. R PT artery couldn't be crossed due to calcification. He got worse and underwent R 1/2 toe amputation on 06/13/17 by Dr. [name]. He then had another IR procedure by Dr [name] on Nov 1st with PTA to prox AT and PT. They were unable to treat the distal tibial vessels/pedal vesselsdue to "dense coral-reef calcification". The foot progressed and he was admitted and underwent R TMA on 08/14/17, again by Dr [name]. He has had persistent pain in the R foot / TMA stump and was taking percocet but said itwasn't working so was changed to po dilaudid. He was started recently on oral abx, cipro and clindamycin and has been taking these.   Since  his hospitalization, blood pressure and AKI continued to improve. He was taken for debridement, revision transmetatarsal amputation with wound vac placement on 12/7 with Dr. [name]. Wound vac removed 12/10. ID was consulted during admission and patient was treated with IV daptomycin and zosyn. Wound cultures from OR revealed enterobacter cloacae. He was recommended to complete ceftriaxone for 6 weeks.   Discharge Diagnoses:  Principal Problem:   Hypotension Active Problems:   Hypertension   CKD (chronic kidney disease), stage III (HCC)   Hyponatremia   COPD (chronic obstructive pulmonary disease) (HCC)   Malaise   Anorexia   Volume depletion   AKI (acute kidney injury) ([place])   Syncope   Osteomyelitis (HCC)   Syncope -Likely secondary to dehydration from nausea and poor oral intake secondary to possible wound infection -Repeat orthostatic VS improved. No further syncopal events. BP stable.    Right transmetarsal wound infection -Has been on several outpatient oral antibiotics and has failed (per pharmacy: doxycycline 9/17, keflex 11/7, clindamycin 11/12, augmentin 11/20, bactrim 11/20, and cipro 11/30)  -Patient hadosteomyelitis and gangrene of right toes with nonhealing surgical wounds secondary to PAD s/p transmetatarsal amputation November 2018 -Blood cultures are negative  -Wound culture with multiple organism  -Podiatry consulted  -Foot xray revealed large soft tissue ulceration overlying distal portion residual fifth metatarsal with possible lytic destruction suggesting osteomyelitis -S/p revision transmetatarsal amputation, debridement, wound vac placement 12/7 with Dr. [name]. Remove vac 12/10, discussed with Dr. [name]  -Deep soft tissue culture showed enterobacter cloacae -Rocephin for  6 weeks, last day 10/22/2017   Acute kidney injury on chronic kidney injury, stage III -Secondary to dehydration and hypotension -Resolved Cr normal now   Normocytic anemia -Drop in  hemoglobin likely dilutional, Hgb remained stable   PAD -With recently RLE revascularization with IR  -Continue aspirin, Plavix  Essential hypertension -Medications held secondary to to hypotension and syncope. Resume norvasc. Holding losartan-HCTZ as BP has been stable off this med   GERD -Continue PPI  Hypokalemia -Replaced     Discharge Instructions  Discharge Instructions    Call MD for:  difficulty breathing, headache or visual disturbances   Complete by:  As directed    Call MD for:  extreme fatigue   Complete by:  As directed    Call MD for:  hives   Complete by:  As directed    Call MD for:  persistant dizziness or light-headedness   Complete by:  As directed    Call MD for:  persistant nausea and vomiting   Complete by:  As directed    Call MD for:  redness, tenderness, or signs of infection (pain, swelling, redness, odor or green/yellow discharge around incision site)   Complete by:  As directed    Call MD for:  severe uncontrolled pain   Complete by:  As directed    Call MD for:  temperature >100.4   Complete by:  As directed    Discharge instructions   Complete by:  As directed    You were cared for by a hospitalist during your hospital stay. If you have any questions about your discharge medications or the care you received while you were in the hospital after you are discharged, you can call the unit and asked to speak with the hospitalist on call if the hospitalist that took care of you is not available. Once you are discharged, your primary care physician will handle any further medical issues. Please note that NO REFILLS for any discharge medications will be authorized once you are discharged, as it is imperative that you return to your primary care physician (or establish a relationship with a primary care physician if you do not have one) for your aftercare needs so that they can reassess your need for medications and monitor your lab values.   Home  infusion instructions [facility] May follow [place] Dosing Protocol; May administer Cathflo as needed to maintain patency of vascular access device.; Flushing of vascular access device: per [facility] Protocol: 0.9% NaCl pre/post medica...   Complete by:  As directed    Instructions:  May follow [place] Dosing Protocol   Instructions:  May administer Cathflo as needed to maintain patency of vascular access device.   Instructions:  Flushing of vascular access device: per [facility] Protocol: 0.9% NaCl pre/post medication administration and prn patency; Heparin 100 u/ml, 54m for implanted ports and Heparin 10u/ml, 556mfor all other central venous catheters.   Instructions:  May follow [facility] Anaphylaxis Protocol for First Dose Administration in the home: 0.9% NaCl at 25-50 ml/hr to maintain IV access for protocol meds. Epinephrine 0.3 ml IV/IM PRN and Benadryl 25-50 IV/IM PRN s/s of anaphylaxis.   Instructions:  [facility] Coordinator (RN) to assist per patient IV care needs in the home PRN.   Increase activity slowly   Complete by:  As directed      Allergies as of 09/17/2017      Reactions   Ace Inhibitors Other (See Comments)   Reaction:  Unknown       Medication List    STOP taking these medications   clindamycin 300 MG capsule Commonly known as:  CLEOCIN   losartan-hydrochlorothiazide 100-12.5 MG tablet Commonly known as:  HYZAAR   oxyCODONE-acetaminophen 10-325 MG tablet Commonly known as:  PERCOCET     TAKE these medications   amitriptyline 25 MG tablet Commonly known as:  ELAVIL Take 25 mg by mouth daily.   amLODipine 10 MG tablet Commonly known as:  NORVASC Take 10 mg by mouth daily.   aspirin EC 81 MG tablet Take 81 mg by mouth at bedtime.   cefTRIAXone IVPB Commonly known as:  ROCEPHIN Inject 2 g into the vein daily. Indication:  R-foot osteomyelitis Last Day of Therapy:  10/22/16 Labs - Once weekly:  CBC/D and BMP, Labs - Every other week:   ESR and CRP   clopidogrel 75 MG tablet Commonly known as:  PLAVIX Take 75 mg by mouth daily.   finasteride 5 MG tablet Commonly known as:  PROSCAR Take 5 mg by mouth daily.   Fish Oil 1200 MG Caps Take 2,400 mg by mouth daily.   HYDROmorphone 2 MG tablet Commonly known as:  DILAUDID Take 1 tablet (2 mg total) by mouth every 4 (four) hours as needed for severe pain.   hydroxychloroquine 200 MG tablet Commonly known as:  PLAQUENIL Take 200 mg by mouth daily.   multivitamin with minerals Tabs tablet Take 1 tablet by mouth daily.   ondansetron 4 MG tablet Commonly known as:  ZOFRAN Take 4 mg by mouth every 8 (eight) hours as needed for nausea or vomiting.   pantoprazole 40 MG tablet Commonly known as:  PROTONIX Take 40 mg by mouth 2 (two) times daily.   predniSONE 5 MG tablet Commonly known as:  DELTASONE Take 5 mg by mouth daily with breakfast.   vitamin B-12 1000 MCG tablet Commonly known as:  CYANOCOBALAMIN Take 1,000 mcg by mouth daily.            Home Infusion Instuctions  (From admission, onward)        Start     Ordered   09/17/17 0000  Home infusion instructions [facility] May follow [place] Dosing Protocol; May administer Cathflo as needed to maintain patency of vascular access device.; Flushing of vascular access device: per [facility] Protocol: 0.9% NaCl pre/post medica...    Question Answer Comment  Instructions May follow [place] Dosing Protocol   Instructions May administer Cathflo as needed to maintain patency of vascular access device.   Instructions Flushing of vascular access device: per [facility] Protocol: 0.9% NaCl pre/post medication administration and prn patency; Heparin 100 u/ml, 56m for implanted ports and Heparin 10u/ml, 544mfor all other central venous catheters.   Instructions May follow [facility] Anaphylaxis Protocol for First Dose Administration in the home: 0.9% NaCl at 25-50 ml/hr to maintain IV access for protocol meds. Epinephrine  0.3 ml IV/IM PRN and Benadryl 25-50 IV/IM PRN s/s of anaphylaxis.   Instructions [facility] Infusion Coordinator (RN) to assist per patient IV care needs in the home PRN.      09/17/17 0819       Durable Medical Equipment  (From admission, onward)        Start     Ordered   09/17/17 0819  DME 3-in-1  Once     09/17/17 [id number]   Follow-up Information    Price, MiChristian MateDPM. Schedule an appointment as soon  as possible for a visit on 09/19/2017.   Specialty:  Podiatry Contact information: [address] [address] [phone]          Allergies  Allergen Reactions  . Ace Inhibitors Other (See Comments)    Reaction:  Unknown     Consultations:  Podiatry Dr. [name]  ID    Procedures/Studies: Dg Chest Portable 1 View  Result Date: 09/09/2017 CLINICAL DATA:  83 year old male with a history of low blood pressure EXAM: PORTABLE CHEST 1 VIEW COMPARISON:  06/12/2017 FINDINGS: Cardiomediastinal silhouette unchanged. No evidence of central vascular congestion. No pneumothorax or pleural effusion. No confluent airspace disease. No acute displaced fracture.  Degenerative changes of the shoulders. IMPRESSION: Low lung volumes and chronic changes with no evidence of superimposed acute cardiopulmonary disease Electronically Signed   By: Corrie Mckusick D.O.   On: 09/09/2017 15:25   Dg Foot Complete Right  Result Date: 09/13/2017 CLINICAL DATA:  Status post transmetatarsal amputation today. EXAM: RIGHT FOOT COMPLETE - 3+ VIEW COMPARISON:  Plain films of the right foot 09/11/2017. FINDINGS: Previously seen and transmetatarsal amputation has been extended. No acute abnormality is identified. Surgical staples are in place. Surgical drain is noted. IMPRESSION: Extension of prior transmetatarsal amputation.  No acute abnormality Electronically Signed   By: Inge Rise M.D.   On: 09/13/2017 20:36   Dg Foot Complete Right  Result Date: 09/11/2017 CLINICAL  DATA:  Right foot pain and swelling. EXAM: RIGHT FOOT COMPLETE - 3+ VIEW COMPARISON:  Radiographs of September 02, 2017. FINDINGS: Vascular calcifications are noted. Status post surgical amputation of all 5 distal metatarsals and phalanges. There appears to be a large ulceration overlying distal portion of residual fifth metatarsal with possible lytic defects seen in the distal portion of residual fifth metatarsal. This may represent osteomyelitis. IMPRESSION: Status post surgical amputation of all 5 distal metatarsals and phalanges. Probable large soft tissue ulceration is seen overlying distal portion of residual fifth metatarsal with possible lytic destruction suggesting osteomyelitis. MRI may be performed for further evaluation. Electronically Signed   By: Marijo Conception, M.D.   On: 09/11/2017 10:44   Dg Foot Complete Right  Result Date: 09/04/2017 Please see detailed radiograph report in office note.      Discharge Exam: Vitals:   09/16/17 2052 09/17/17 0456  BP: 123/81 (!) 133/93  Pulse: 64 66  Resp: 17 18  Temp: 98.6 F (37 C) 98.7 F (37.1 C)  SpO2: 100% 94%   Vitals:   09/16/17 0416 09/16/17 0825 09/16/17 2052 09/17/17 0456  BP: 126/83 (!) 145/67 123/81 (!) 133/93  Pulse: 80 (!) 117 64 66  Resp: '18 18 17 18  ' Temp: 98.5 F (36.9 C) 98 F (36.7 C) 98.6 F (37 C) 98.7 F (37.1 C)  TempSrc: Oral Oral Oral Oral  SpO2: 100% 96% 100% 94%  Weight:    67.8 kg (149 lb 7.6 oz)  Height:        General: Pt is alert, awake, not in acute distress Cardiovascular: RRR, S1/S2 +, no rubs, no gallops Respiratory: CTA bilaterally, no wheezing, no rhonchi Abdominal: Soft, NT, ND, bowel sounds + Extremities: no edema, no cyanosis, right foot in clean and dry dressing     The results of significant diagnostics from this hospitalization (including imaging, microbiology, ancillary and laboratory) are listed below for reference.     Microbiology: Recent Results (from the past 240  hour(s))  Culture, blood (Routine x 2)     Status: None  Collection Time: 09/09/17  3:16 PM  Result Value Ref Range Status   Specimen Description BLOOD RIGHT ANTECUBITAL  Final   Special Requests   Final    BOTTLES DRAWN AEROBIC AND ANAEROBIC Blood Culture adequate volume   Culture NO GROWTH 5 DAYS  Final   Report Status 09/14/2017 FINAL  Final  Culture, blood (Routine x 2)     Status: None   Collection Time: 09/09/17  3:29 PM  Result Value Ref Range Status   Specimen Description BLOOD LEFT ANTECUBITAL  Final   Special Requests   Final    BOTTLES DRAWN AEROBIC AND ANAEROBIC Blood Culture adequate volume   Culture NO GROWTH 5 DAYS  Final   Report Status 09/14/2017 FINAL  Final  Urine Culture     Status: Abnormal   Collection Time: 09/09/17  5:26 PM  Result Value Ref Range Status   Specimen Description URINE, RANDOM  Final   Special Requests NONE  Final   Culture <10,000 COLONIES/mL INSIGNIFICANT GROWTH (A)  Final   Report Status 09/10/2017 FINAL  Final  Aerobic Culture (superficial specimen)     Status: Abnormal   Collection Time: 09/09/17  8:18 PM  Result Value Ref Range Status   Specimen Description WOUND FOOT  Final   Special Requests AMPUTATION  Final   Gram Stain   Final    FEW WBC PRESENT, PREDOMINANTLY PMN RARE GRAM POSITIVE RODS    Culture MULTIPLE ORGANISMS PRESENT, NONE PREDOMINANT (A)  Final   Report Status 09/12/2017 FINAL  Final  Aerobic/Anaerobic Culture (surgical/deep wound)     Status: None (Preliminary result)   Collection Time: 09/13/17  5:30 PM  Result Value Ref Range Status   Specimen Description WOUND  Final   Special Requests DEEP SOFT TISSUE CENTRAL  Final   Gram Stain NO WBC SEEN NO ORGANISMS SEEN   Final   Culture   Final    NO GROWTH 3 DAYS NO ANAEROBES ISOLATED; CULTURE IN PROGRESS FOR 5 DAYS   Report Status PENDING  Incomplete  Acid Fast Smear (AFB)     Status: None   Collection Time: 09/13/17  5:30 PM  Result Value Ref Range Status   AFB  Specimen Processing Comment  Final    Comment: Tissue Grinding and Digestion/Decontamination   Acid Fast Smear Negative  Final    Comment: (NOTE) Performed At: [facility] [place], [place] [id number] Rush Farmer MD GY:[id number]    Source (AFB) WOUND  Final    Comment: DEEP SOFT TISSUE CENTRAL  Aerobic/Anaerobic Culture (surgical/deep wound)     Status: None (Preliminary result)   Collection Time: 09/13/17  5:31 PM  Result Value Ref Range Status   Specimen Description WOUND  Final   Special Requests DEEP SOFT TISSUE INTERNAL  Final   Gram Stain   Final    RARE WBC PRESENT,BOTH PMN AND MONONUCLEAR NO ORGANISMS SEEN    Culture   Final    FEW ENTEROBACTER CLOACAE CRITICAL RESULT CALLED TO, READ BACK BY AND VERIFIED WITH: A MABE,RN AT 1705 09/14/17 BY L BENFIELD CONCERNING GROWTH ON CULTURE NO ANAEROBES ISOLATED; CULTURE IN PROGRESS FOR 5 DAYS    Report Status PENDING  Incomplete   Organism ID, Bacteria ENTEROBACTER CLOACAE  Final      Susceptibility   Enterobacter cloacae - MIC*    CEFAZOLIN >=64 RESISTANT Resistant     CEFEPIME <=1 SENSITIVE Sensitive     CEFTAZIDIME <=1 SENSITIVE Sensitive     CEFTRIAXONE <=1  SENSITIVE Sensitive     CIPROFLOXACIN <=0.25 SENSITIVE Sensitive     GENTAMICIN <=1 SENSITIVE Sensitive     IMIPENEM <=0.25 SENSITIVE Sensitive     TRIMETH/SULFA <=20 SENSITIVE Sensitive     PIP/TAZO <=4 SENSITIVE Sensitive     * FEW ENTEROBACTER CLOACAE  Acid Fast Smear (AFB)     Status: None   Collection Time: 09/13/17  5:31 PM  Result Value Ref Range Status   AFB Specimen Processing Concentration  Final   Acid Fast Smear Negative  Final    Comment: (NOTE) Performed At: [facility] [place], [place] [id number] Rush Farmer MD YJ:[id number]    Source (AFB) WOUND  Final    Comment: DEEP SOFT TISSUE INTERNAL  Aerobic/Anaerobic Culture (surgical/deep wound)     Status: None (Preliminary result)   Collection Time:  09/13/17  5:32 PM  Result Value Ref Range Status   Specimen Description WOUND  Final   Special Requests FIFTH METATARSAL BONE  Final   Gram Stain   Final    RARE WBC PRESENT, PREDOMINANTLY MONONUCLEAR NO ORGANISMS SEEN    Culture   Final    RARE ENTEROBACTER CLOACAE SUSCEPTIBILITIES PERFORMED ON PREVIOUS CULTURE WITHIN THE LAST 5 DAYS. NO ANAEROBES ISOLATED; CULTURE IN PROGRESS FOR 5 DAYS CRITICAL RESULT CALLED TO, READ BACK BY AND VERIFIED WITH: A MABE,RN AT 1705 09/14/17 BY L BENFIELD CONCERNING GROWTH ON CULTURE    Report Status PENDING  Incomplete  Acid Fast Smear (AFB)     Status: None   Collection Time: 09/13/17  5:32 PM  Result Value Ref Range Status   AFB Specimen Processing Comment  Final    Comment: Tissue Grinding and Digestion/Decontamination   Acid Fast Smear Negative  Final    Comment: (NOTE) Performed At: [facility] [place], [place] [id number] Rush Farmer MD YD:[id number]    Source (AFB) WOUND  Final    Comment: FIFTH METATARSAL BONE  Aerobic/Anaerobic Culture (surgical/deep wound)     Status: None (Preliminary result)   Collection Time: 09/13/17  5:33 PM  Result Value Ref Range Status   Specimen Description WOUND  Final   Special Requests THIRD METATARSAL BONE  Final   Gram Stain   Final    WBC PRESENT, PREDOMINANTLY PMN NO ORGANISMS SEEN    Culture   Final    RARE STAPHYLOCOCCUS AUREUS RARE GRAM NEGATIVE RODS IDENTIFICATION TO FOLLOW GROWTH ON CULTURE. CRITICAL VALUE NOTED.  VALUE IS CONSISTENT WITH PREVIOUSLY REPORTED AND CALLED VALUE.    Report Status PENDING  Incomplete  Acid Fast Smear (AFB)     Status: None   Collection Time: 09/13/17  5:33 PM  Result Value Ref Range Status   AFB Specimen Processing Comment  Final    Comment: Tissue Grinding and Digestion/Decontamination   Acid Fast Smear Negative  Final    Comment: (NOTE) Performed At: [facility] [place], [place] [id number] Rush Farmer  MD GE:[id number]    Source (AFB) WOUND  Final    Comment: THIRD METATARSAL BONE  Aerobic/Anaerobic Culture (surgical/deep wound)     Status: None (Preliminary result)   Collection Time: 09/13/17  5:41 PM  Result Value Ref Range Status   Specimen Description WOUND  Final   Special Requests FIRST METATARSAL BONE  Final   Gram Stain   Final    RARE WBC PRESENT, PREDOMINANTLY PMN NO ORGANISMS SEEN    Culture   Final    RARE DIPHTHEROIDS(CORYNEBACTERIUM SPECIES) Standardized susceptibility  testing for this organism is not available. NO ANAEROBES ISOLATED; CULTURE IN PROGRESS FOR 5 DAYS GROWTH ON CULTURE. CRITICAL VALUE NOTED.  VALUE IS CONSISTENT WITH PREVIOUSLY REPORTED AND CALLED VALUE.    Report Status PENDING  Incomplete  Acid Fast Smear (AFB)     Status: None   Collection Time: 09/13/17  5:41 PM  Result Value Ref Range Status   AFB Specimen Processing Concentration  Final   Acid Fast Smear Negative  Final    Comment: (NOTE) Performed At: [facility] [place], [place] [id number] Rush Farmer MD UU:[id number]    Source (AFB) WOUND  Final    Comment: FIRST METATARSAL BONE     Labs: BNP (last 3 results) Recent Labs    12/05/16 1300  BNP 91.5   Basic Metabolic Panel: Recent Labs  Lab 09/13/17 0347 09/14/17 0410 09/15/17 0404 09/16/17 0442 09/17/17 0552  NA 137 137 136 136 136  K 3.5 3.7 3.2* 3.6 3.4*  CL 106 105 104 104 104  CO2 24 21* '24 26 22  ' GLUCOSE 94 116* 100* 91 87  BUN '8 8 10 9 10  ' CREATININE 1.33* 1.16 1.26* 1.15 1.19  CALCIUM 8.8* 8.9 8.4* 8.7* 9.0  MG  --   --   --  1.7  --    Liver Function Tests: No results for input(s): AST, ALT, ALKPHOS, BILITOT, PROT, ALBUMIN in the last 168 hours. No results for input(s): LIPASE, AMYLASE in the last 168 hours. No results for input(s): AMMONIA in the last 168 hours. CBC: Recent Labs  Lab 09/13/17 0347 09/14/17 0410 09/15/17 0404 09/16/17 0442 09/17/17 0552  WBC 7.1 8.2 8.9 7.4  5.8  HGB 9.2* 9.5* 8.6* 9.0* 9.4*  HCT 28.1* 28.5* 26.2* 27.4* 28.8*  MCV 85.9 87.4 86.5 88.1 88.1  PLT 225 230 181 185 188   Cardiac Enzymes: Recent Labs  Lab 09/12/17 1616 09/13/17 0347  CKTOTAL 47* 45*   BNP: Invalid input(s): POCBNP CBG: No results for input(s): GLUCAP in the last 168 hours. D-Dimer No results for input(s): DDIMER in the last 72 hours. Hgb A1c No results for input(s): HGBA1C in the last 72 hours. Lipid Profile No results for input(s): CHOL, HDL, LDLCALC, TRIG, CHOLHDL, LDLDIRECT in the last 72 hours. Thyroid function studies No results for input(s): TSH, T4TOTAL, T3FREE, THYROIDAB in the last 72 hours.  Invalid input(s): FREET3 Anemia work up No results for input(s): VITAMINB12, FOLATE, FERRITIN, TIBC, IRON, RETICCTPCT in the last 72 hours. Urinalysis    Component Value Date/Time   COLORURINE YELLOW 09/09/2017 1724   APPEARANCEUR CLEAR 09/09/2017 1724   LABSPEC 1.009 09/09/2017 1724   PHURINE 6.0 09/09/2017 1724   GLUCOSEU NEGATIVE 09/09/2017 1724   HGBUR NEGATIVE 09/09/2017 1724   BILIRUBINUR NEGATIVE 09/09/2017 1724   KETONESUR NEGATIVE 09/09/2017 1724   PROTEINUR NEGATIVE 09/09/2017 1724   UROBILINOGEN 1.0 11/27/2013 0947   NITRITE NEGATIVE 09/09/2017 1724   LEUKOCYTESUR NEGATIVE 09/09/2017 1724   Sepsis Labs Invalid input(s): PROCALCITONIN,  WBC,  LACTICIDVEN Microbiology Recent Results (from the past 240 hour(s))  Culture, blood (Routine x 2)     Status: None   Collection Time: 09/09/17  3:16 PM  Result Value Ref Range Status   Specimen Description BLOOD RIGHT ANTECUBITAL  Final   Special Requests   Final    BOTTLES DRAWN AEROBIC AND ANAEROBIC Blood Culture adequate volume   Culture NO GROWTH 5 DAYS  Final   Report Status 09/14/2017 FINAL  Final  Culture, blood (  Routine x 2)     Status: None   Collection Time: 09/09/17  3:29 PM  Result Value Ref Range Status   Specimen Description BLOOD LEFT ANTECUBITAL  Final   Special Requests    Final    BOTTLES DRAWN AEROBIC AND ANAEROBIC Blood Culture adequate volume   Culture NO GROWTH 5 DAYS  Final   Report Status 09/14/2017 FINAL  Final  Urine Culture     Status: Abnormal   Collection Time: 09/09/17  5:26 PM  Result Value Ref Range Status   Specimen Description URINE, RANDOM  Final   Special Requests NONE  Final   Culture <10,000 COLONIES/mL INSIGNIFICANT GROWTH (A)  Final   Report Status 09/10/2017 FINAL  Final  Aerobic Culture (superficial specimen)     Status: Abnormal   Collection Time: 09/09/17  8:18 PM  Result Value Ref Range Status   Specimen Description WOUND FOOT  Final   Special Requests AMPUTATION  Final   Gram Stain   Final    FEW WBC PRESENT, PREDOMINANTLY PMN RARE GRAM POSITIVE RODS    Culture MULTIPLE ORGANISMS PRESENT, NONE PREDOMINANT (A)  Final   Report Status 09/12/2017 FINAL  Final  Aerobic/Anaerobic Culture (surgical/deep wound)     Status: None (Preliminary result)   Collection Time: 09/13/17  5:30 PM  Result Value Ref Range Status   Specimen Description WOUND  Final   Special Requests DEEP SOFT TISSUE CENTRAL  Final   Gram Stain NO WBC SEEN NO ORGANISMS SEEN   Final   Culture   Final    NO GROWTH 3 DAYS NO ANAEROBES ISOLATED; CULTURE IN PROGRESS FOR 5 DAYS   Report Status PENDING  Incomplete  Acid Fast Smear (AFB)     Status: None   Collection Time: 09/13/17  5:30 PM  Result Value Ref Range Status   AFB Specimen Processing Comment  Final    Comment: Tissue Grinding and Digestion/Decontamination   Acid Fast Smear Negative  Final    Comment: (NOTE) Performed At: [facility] [place], [place] [id number] Rush Farmer MD WG:[id number]    Source (AFB) WOUND  Final    Comment: DEEP SOFT TISSUE CENTRAL  Aerobic/Anaerobic Culture (surgical/deep wound)     Status: None (Preliminary result)   Collection Time: 09/13/17  5:31 PM  Result Value Ref Range Status   Specimen Description WOUND  Final   Special Requests DEEP  SOFT TISSUE INTERNAL  Final   Gram Stain   Final    RARE WBC PRESENT,BOTH PMN AND MONONUCLEAR NO ORGANISMS SEEN    Culture   Final    FEW ENTEROBACTER CLOACAE CRITICAL RESULT CALLED TO, READ BACK BY AND VERIFIED WITH: A MABE,RN AT 1705 09/14/17 BY L BENFIELD CONCERNING GROWTH ON CULTURE NO ANAEROBES ISOLATED; CULTURE IN PROGRESS FOR 5 DAYS    Report Status PENDING  Incomplete   Organism ID, Bacteria ENTEROBACTER CLOACAE  Final      Susceptibility   Enterobacter cloacae - MIC*    CEFAZOLIN >=64 RESISTANT Resistant     CEFEPIME <=1 SENSITIVE Sensitive     CEFTAZIDIME <=1 SENSITIVE Sensitive     CEFTRIAXONE <=1 SENSITIVE Sensitive     CIPROFLOXACIN <=0.25 SENSITIVE Sensitive     GENTAMICIN <=1 SENSITIVE Sensitive     IMIPENEM <=0.25 SENSITIVE Sensitive     TRIMETH/SULFA <=20 SENSITIVE Sensitive     PIP/TAZO <=4 SENSITIVE Sensitive     * FEW ENTEROBACTER CLOACAE  Acid Fast Smear (AFB)  Status: None   Collection Time: 09/13/17  5:31 PM  Result Value Ref Range Status   AFB Specimen Processing Concentration  Final   Acid Fast Smear Negative  Final    Comment: (NOTE) Performed At: [facility] [place], [place] [id number] Rush Farmer MD YB:[id number]    Source (AFB) WOUND  Final    Comment: DEEP SOFT TISSUE INTERNAL  Aerobic/Anaerobic Culture (surgical/deep wound)     Status: None (Preliminary result)   Collection Time: 09/13/17  5:32 PM  Result Value Ref Range Status   Specimen Description WOUND  Final   Special Requests FIFTH METATARSAL BONE  Final   Gram Stain   Final    RARE WBC PRESENT, PREDOMINANTLY MONONUCLEAR NO ORGANISMS SEEN    Culture   Final    RARE ENTEROBACTER CLOACAE SUSCEPTIBILITIES PERFORMED ON PREVIOUS CULTURE WITHIN THE LAST 5 DAYS. NO ANAEROBES ISOLATED; CULTURE IN PROGRESS FOR 5 DAYS CRITICAL RESULT CALLED TO, READ BACK BY AND VERIFIED WITH: A MABE,RN AT 1705 09/14/17 BY L BENFIELD CONCERNING GROWTH ON CULTURE    Report Status  PENDING  Incomplete  Acid Fast Smear (AFB)     Status: None   Collection Time: 09/13/17  5:32 PM  Result Value Ref Range Status   AFB Specimen Processing Comment  Final    Comment: Tissue Grinding and Digestion/Decontamination   Acid Fast Smear Negative  Final    Comment: (NOTE) Performed At: [facility] [place], [place] [id number] Rush Farmer MD [id number]    Source (AFB) WOUND  Final    Comment: FIFTH METATARSAL BONE  Aerobic/Anaerobic Culture (surgical/deep wound)     Status: None (Preliminary result)   Collection Time: 09/13/17  5:33 PM  Result Value Ref Range Status   Specimen Description WOUND  Final   Special Requests THIRD METATARSAL BONE  Final   Gram Stain   Final    WBC PRESENT, PREDOMINANTLY PMN NO ORGANISMS SEEN    Culture   Final    RARE STAPHYLOCOCCUS AUREUS RARE GRAM NEGATIVE RODS IDENTIFICATION TO FOLLOW GROWTH ON CULTURE. CRITICAL VALUE NOTED.  VALUE IS CONSISTENT WITH PREVIOUSLY REPORTED AND CALLED VALUE.    Report Status PENDING  Incomplete  Acid Fast Smear (AFB)     Status: None   Collection Time: 09/13/17  5:33 PM  Result Value Ref Range Status   AFB Specimen Processing Comment  Final    Comment: Tissue Grinding and Digestion/Decontamination   Acid Fast Smear Negative  Final    Comment: (NOTE) Performed At: [facility] [place], [place] [id number] Rush Farmer MD OI:[id number]    Source (AFB) WOUND  Final    Comment: THIRD METATARSAL BONE  Aerobic/Anaerobic Culture (surgical/deep wound)     Status: None (Preliminary result)   Collection Time: 09/13/17  5:41 PM  Result Value Ref Range Status   Specimen Description WOUND  Final   Special Requests FIRST METATARSAL BONE  Final   Gram Stain   Final    RARE WBC PRESENT, PREDOMINANTLY PMN NO ORGANISMS SEEN    Culture   Final    RARE DIPHTHEROIDS(CORYNEBACTERIUM SPECIES) Standardized susceptibility testing for this organism is not available. NO  ANAEROBES ISOLATED; CULTURE IN PROGRESS FOR 5 DAYS GROWTH ON CULTURE. CRITICAL VALUE NOTED.  VALUE IS CONSISTENT WITH PREVIOUSLY REPORTED AND CALLED VALUE.    Report Status PENDING  Incomplete  Acid Fast Smear (AFB)     Status: None   Collection Time: 09/13/17  5:41 PM  Result Value Ref Range Status   AFB Specimen Processing Concentration  Final   Acid Fast Smear Negative  Final    Comment: (NOTE) Performed At: [facility] [place], [place] [id number] Rush Farmer MD LP:[id number]    Source (AFB) WOUND  Final    Comment: FIRST METATARSAL BONE     Time coordinating discharge: 40 minutes  SIGNED:  Dessa Phi, DO [facility] Pager [phone]  If 7PM-7AM, please contact night-coverage [email or web address] Password [email or web address] 09/17/2017, 8:22 AM

## 2017-09-17 NOTE — Evaluation (Signed)
Occupational Therapy Evaluation Patient Details Name: Derrick [name] MRN: [id number] DOB: [birth year] Today's Date: 09/17/2017    History of Present Illness Pt is an 83 y/o male admitted secondary to Hypotension, R transmetatarsal wound infection. PMH includes R transmetatarsal amputation, COPD, CKD, HTN, PAD, prostate cancer, and s/p nephrectomy.   Clinical Impression   Pt admitted with above.  He demonstrates generalized weakness, decreased activity tolerance, impaired balance.  He requires min guard assist for ADLs, and fatigues rather quickly.  Heart monitor showed A-Fib with max HR 172 during ADL activity.  Pt with DOE 3/4.   Pt, wife and son instructed that he should sit to shower, he needs direct supervision/assist when up due to risk of falls, and instructed pt to pace himself and take frequent rest breaks.  They verbalized understanding.  Wife will be able to provide necessary level of assist.  All further OT needs can be addressed by [place].     Follow Up Recommendations  Home health OT;Supervision/Assistance - 24 hour    Equipment Recommendations  None recommended by OT    Recommendations for Other Services       Precautions / Restrictions Precautions Precautions: Fall Restrictions Weight Bearing Restrictions: No      Mobility Bed Mobility Overal bed mobility: Needs Assistance Bed Mobility: Supine to Sit;Sit to Supine     Supine to sit: Supervision Sit to supine: Supervision   General bed mobility comments: Supervision for safety, no assist needed  Transfers Overall transfer level: Needs assistance Equipment used: Rolling walker (2 wheeled) Transfers: Sit to/from Stand Sit to Stand: Min guard         General transfer comment: min guard for safety    Balance Overall balance assessment: Needs assistance Sitting-balance support: No upper extremity supported;Feet supported Sitting balance-Leahy Scale: Good     Standing balance support: Bilateral upper  extremity supported;During functional activity Standing balance-Leahy Scale: Poor Standing balance comment: reliant on UE support.  Pt with LOB x 2 during ADL task                            ADL either performed or assessed with clinical judgement   ADL Overall ADL's : Needs assistance/impaired Eating/Feeding: Independent   Grooming: Wash/dry hands;Wash/dry face;Oral care;Brushing hair;Min guard;Standing   Upper Body Bathing: Supervision/ safety;Set up;Sitting   Lower Body Bathing: Min guard;Sit to/from stand   Upper Body Dressing : Supervision/safety;Sitting   Lower Body Dressing: Min guard;Sit to/from stand   Toilet Transfer: Min guard;Ambulation;Comfort height toilet;Grab bars;RW   Toileting- Water quality scientist and Hygiene: Min guard;Sit to/from stand       Functional mobility during ADLs: Min guard;Rolling walker       Vision Baseline Vision/History: Wears glasses Wears Glasses: At all times Patient Visual Report: No change from baseline       Perception     Praxis      Pertinent Vitals/Pain Pain Assessment: No/denies pain Faces Pain Scale: Hurts little more Pain Location: R LE  Pain Descriptors / Indicators: Sore(swollen) Pain Intervention(s): Limited activity within patient's tolerance;Monitored during session     Hand Dominance Right   Extremity/Trunk Assessment Upper Extremity Assessment Upper Extremity Assessment: Generalized weakness   Lower Extremity Assessment Lower Extremity Assessment: Defer to PT evaluation   Cervical / Trunk Assessment Cervical / Trunk Assessment: Kyphotic   Communication Communication Communication: No difficulties   Cognition Arousal/Alertness: Awake/alert Behavior During Therapy: WFL for tasks assessed/performed Overall Cognitive Status: Impaired/Different from  baseline Area of Impairment: Attention;Memory;Safety/judgement                   Current Attention Level: Selective Memory:  Decreased short-term memory   Safety/Judgement: Decreased awareness of deficits;Decreased awareness of safety   Problem Solving: Difficulty sequencing;Requires verbal cues General Comments: wife reports pt is not back to baseline.  She indidates he is very distractable    General Comments  Pt in A-Fib per monitor with max HR 172 with activity.  Discussed need for assist when he is up and performing ADLs, pacing self, taking frequent rest breaks, and recommendation that he sits to shower.  Wife and son verbalize understanding     Exercises Exercises: General Lower Extremity General Exercises - Lower Extremity Ankle Circles/Pumps: AROM;Both;10 reps;Seated Long Arc Quad: AROM;Both;10 reps;Seated   Shoulder Instructions      Home Living Family/patient expects to be discharged to:: Private residence Living Arrangements: Spouse/significant other Available Help at Discharge: Family;Available 24 hours/day Type of Home: House Home Access: Stairs to enter [employer] of Steps: 2 Entrance Stairs-Rails: Right;Left;Can reach both Home Layout: One level     Bathroom Shower/Tub: Occupational psychologist: Handicapped height     Home Equipment: Clinical cytogeneticist - 2 wheels;Cane - single point;Wheelchair - manual;Bedside commode   Additional Comments: wife is available 24 hours a day      Prior Functioning/Environment Level of Independence: Independent with assistive device(s)        Comments: Used RW, shower seat        OT Problem List: Decreased strength;Decreased activity tolerance;Impaired balance (sitting and/or standing);Decreased cognition;Decreased safety awareness;Decreased knowledge of use of DME or AE;Cardiopulmonary status limiting activity      OT Treatment/Interventions:      OT Goals(Current goals can be found in the care plan section) Acute Rehab OT Goals Patient Stated Goal: to go home  OT Goal Formulation: All assessment and education  complete, DC therapy  OT Frequency:     Barriers to D/C:            Co-evaluation              AM-PAC PT "6 Clicks" Daily Activity     Outcome Measure Help from another person eating meals?: None Help from another person taking care of personal grooming?: A Little Help from another person toileting, which includes using toliet, bedpan, or urinal?: A Little Help from another person bathing (including washing, rinsing, drying)?: A Little Help from another person to put on and taking off regular upper body clothing?: A Little Help from another person to put on and taking off regular lower body clothing?: A Little 6 Click Score: 19   End of Session Equipment Utilized During Treatment: Rolling walker Nurse Communication: Mobility status;Other (comment)(HR to 172)  Activity Tolerance: Patient tolerated treatment well Patient left: in bed;with call bell/phone within reach;with family/visitor present  OT Visit Diagnosis: Unsteadiness on feet (R26.81)                Time: 2094-7096 OT Time Calculation (min): 28 min Charges:  OT General Charges $OT Visit: 1 Visit OT Evaluation $OT Eval High Complexity: 1 High OT Treatments $Self Care/Home Management : 8-22 mins G-Codes:     [facility], OTR/L [phone]   Lucille Passy M 09/17/2017, 1:16 PM

## 2017-09-17 NOTE — Care Management Important Message (Signed)
Important Message  Patient Details  Name: Derrick [name] MRN: [id number] Date of Birth: Jan 28, 1932   Medicare Important Message Given:  Yes    [name] [name] 09/17/2017, 3:41 PM

## 2017-09-17 NOTE — Progress Notes (Signed)
Peripherally Inserted Central Catheter/Midline Placement  The IV Nurse has discussed with the patient and/or persons authorized to consent for the patient, the purpose of this procedure and the potential benefits and risks involved with this procedure.  The benefits include less needle sticks, lab draws from the catheter, and the patient may be discharged home with the catheter. Risks include, but not limited to, infection, bleeding, blood clot (thrombus formation), and puncture of an artery; nerve damage and irregular heartbeat and possibility to perform a PICC exchange if needed/ordered by physician.  Alternatives to this procedure were also discussed.  Bard Power PICC patient education guide, fact sheet on infection prevention and patient information card has been provided to patient /or left at bedside.    PICC/Midline Placement Documentation  PICC Single Lumen 60/73/71 PICC Right Basilic 39 cm 0 cm (Active)  Indication for Insertion or Continuance of Line Home intravenous therapies (PICC only) 09/17/2017  2:31 PM  Exposed Catheter (cm) 0 cm 09/17/2017  2:31 PM  Site Assessment Clean;Dry;Intact 09/17/2017  2:31 PM  Line Status Flushed;Saline locked;Blood return noted 09/17/2017  2:31 PM  Dressing Type Transparent;Securing device 09/17/2017  2:31 PM  Dressing Status Clean;Dry;Intact;Antimicrobial disc in place 09/17/2017  2:31 PM  Dressing Change Due 09/24/17 09/17/2017  2:31 PM       Frances Maywood 09/17/2017, 2:34 PM

## 2017-09-17 NOTE — Care Management Note (Addendum)
Case Management Note  Patient Details  Name: ZAYVIER CARAVELLO MRN: [id number] Date of Birth: 20-Apr-1932  Subjective/Objective:  CM following for progression and d/c planning.                   Action/Plan: 09/17/2017 Pt and family for home IV antibiotic instructions, however this pt became confused and pulled out PICC last pm. New PICC placed today and [place] IV coordinator, Carolynn Sayers here to instruct the family on IV administration.  Noted order for 3:1, however pt and family declined stating that they do not need this equipment.  Expected Discharge Date:  09/17/17               Expected Discharge Plan:  [place]  In-House Referral:  NA  Discharge planning Services  CM Consult  Post Acute Care Choice:    Choice offered to:     DME Arranged:  IV pump/equipment DME Agency:  [place] Arranged:  RN, PT [facility] Agency:  [facility]  Status of Service:  In process, will continue to follow  If discussed at Long Length of Stay Meetings, dates discussed:    Additional Comments:  Adron Bene, RN 09/17/2017, 3:26 PM

## 2017-09-18 DIAGNOSIS — N179 Acute kidney failure, unspecified: Secondary | ICD-10-CM | POA: Diagnosis not present

## 2017-09-18 DIAGNOSIS — M79674 Pain in right toe(s): Secondary | ICD-10-CM | POA: Diagnosis not present

## 2017-09-19 ENCOUNTER — Other Ambulatory Visit: Payer: Self-pay

## 2017-09-19 ENCOUNTER — Telehealth: Payer: Self-pay | Admitting: Podiatry

## 2017-09-19 ENCOUNTER — Observation Stay (HOSPITAL_COMMUNITY)
Admission: EM | Admit: 2017-09-19 | Discharge: 2017-09-20 | Disposition: A | Discharge status: Home / Self Care | Payer: Medicare Other | Attending: Internal Medicine | Admitting: Internal Medicine

## 2017-09-19 ENCOUNTER — Emergency Department (HOSPITAL_COMMUNITY): Payer: Medicare Other

## 2017-09-19 ENCOUNTER — Encounter (HOSPITAL_COMMUNITY): Payer: Self-pay | Admitting: Emergency Medicine

## 2017-09-19 DIAGNOSIS — I129 Hypertensive chronic kidney disease with stage 1 through stage 4 chronic kidney disease, or unspecified chronic kidney disease: Secondary | ICD-10-CM | POA: Insufficient documentation

## 2017-09-19 DIAGNOSIS — Z7902 Long term (current) use of antithrombotics/antiplatelets: Secondary | ICD-10-CM | POA: Insufficient documentation

## 2017-09-19 DIAGNOSIS — K219 Gastro-esophageal reflux disease without esophagitis: Secondary | ICD-10-CM | POA: Insufficient documentation

## 2017-09-19 DIAGNOSIS — R55 Syncope and collapse: Secondary | ICD-10-CM | POA: Diagnosis not present

## 2017-09-19 DIAGNOSIS — E1151 Type 2 diabetes mellitus with diabetic peripheral angiopathy without gangrene: Secondary | ICD-10-CM | POA: Diagnosis not present

## 2017-09-19 DIAGNOSIS — Z7982 Long term (current) use of aspirin: Secondary | ICD-10-CM | POA: Insufficient documentation

## 2017-09-19 DIAGNOSIS — I2510 Atherosclerotic heart disease of native coronary artery without angina pectoris: Secondary | ICD-10-CM | POA: Diagnosis not present

## 2017-09-19 DIAGNOSIS — I10 Essential (primary) hypertension: Secondary | ICD-10-CM | POA: Diagnosis present

## 2017-09-19 DIAGNOSIS — D631 Anemia in chronic kidney disease: Secondary | ICD-10-CM | POA: Diagnosis not present

## 2017-09-19 DIAGNOSIS — E869 Volume depletion, unspecified: Secondary | ICD-10-CM | POA: Insufficient documentation

## 2017-09-19 DIAGNOSIS — Z85528 Personal history of other malignant neoplasm of kidney: Secondary | ICD-10-CM | POA: Diagnosis not present

## 2017-09-19 DIAGNOSIS — I25110 Atherosclerotic heart disease of native coronary artery with unstable angina pectoris: Secondary | ICD-10-CM | POA: Diagnosis not present

## 2017-09-19 DIAGNOSIS — N179 Acute kidney failure, unspecified: Secondary | ICD-10-CM | POA: Diagnosis present

## 2017-09-19 DIAGNOSIS — F329 Major depressive disorder, single episode, unspecified: Secondary | ICD-10-CM | POA: Insufficient documentation

## 2017-09-19 DIAGNOSIS — Z89431 Acquired absence of right foot: Secondary | ICD-10-CM | POA: Diagnosis not present

## 2017-09-19 DIAGNOSIS — Z79899 Other long term (current) drug therapy: Secondary | ICD-10-CM | POA: Insufficient documentation

## 2017-09-19 DIAGNOSIS — I959 Hypotension, unspecified: Secondary | ICD-10-CM | POA: Diagnosis present

## 2017-09-19 DIAGNOSIS — N183 Chronic kidney disease, stage 3 (moderate): Secondary | ICD-10-CM | POA: Diagnosis not present

## 2017-09-19 DIAGNOSIS — M7989 Other specified soft tissue disorders: Secondary | ICD-10-CM | POA: Diagnosis not present

## 2017-09-19 DIAGNOSIS — Z905 Acquired absence of kidney: Secondary | ICD-10-CM | POA: Insufficient documentation

## 2017-09-19 DIAGNOSIS — Z7952 Long term (current) use of systemic steroids: Secondary | ICD-10-CM | POA: Insufficient documentation

## 2017-09-19 DIAGNOSIS — E860 Dehydration: Secondary | ICD-10-CM | POA: Diagnosis not present

## 2017-09-19 DIAGNOSIS — J449 Chronic obstructive pulmonary disease, unspecified: Secondary | ICD-10-CM | POA: Diagnosis not present

## 2017-09-19 DIAGNOSIS — N400 Enlarged prostate without lower urinary tract symptoms: Secondary | ICD-10-CM | POA: Diagnosis not present

## 2017-09-19 DIAGNOSIS — Z89411 Acquired absence of right great toe: Secondary | ICD-10-CM | POA: Diagnosis not present

## 2017-09-19 DIAGNOSIS — Z8546 Personal history of malignant neoplasm of prostate: Secondary | ICD-10-CM | POA: Insufficient documentation

## 2017-09-19 DIAGNOSIS — Z89421 Acquired absence of other right toe(s): Secondary | ICD-10-CM | POA: Diagnosis not present

## 2017-09-19 LAB — AEROBIC/ANAEROBIC CULTURE W GRAM STAIN (SURGICAL/DEEP WOUND): Gram Stain: NONE SEEN

## 2017-09-19 LAB — CBC WITH DIFFERENTIAL/PLATELET
Basophils Absolute: 0 10*3/uL (ref 0.0–0.1)
Basophils Relative: 0 %
Eosinophils Absolute: 0.8 10*3/uL — ABNORMAL HIGH (ref 0.0–0.7)
Eosinophils Relative: 10 %
HEMATOCRIT: 31 % — AB (ref 39.0–52.0)
HEMOGLOBIN: 10 g/dL — AB (ref 13.0–17.0)
LYMPHS ABS: 2.2 10*3/uL (ref 0.7–4.0)
LYMPHS PCT: 26 %
MCH: 28.4 pg (ref 26.0–34.0)
MCHC: 32.3 g/dL (ref 30.0–36.0)
MCV: 88.1 fL (ref 78.0–100.0)
MONOS PCT: 11 %
Monocytes Absolute: 1 10*3/uL (ref 0.1–1.0)
NEUTROS PCT: 53 %
Neutro Abs: 4.4 10*3/uL (ref 1.7–7.7)
Platelets: 240 10*3/uL (ref 150–400)
RBC: 3.52 MIL/uL — ABNORMAL LOW (ref 4.22–5.81)
RDW: 16.1 % — ABNORMAL HIGH (ref 11.5–15.5)
WBC: 8.4 10*3/uL (ref 4.0–10.5)

## 2017-09-19 LAB — I-STAT CG4 LACTIC ACID, ED
LACTIC ACID, VENOUS: 1.49 mmol/L (ref 0.5–1.9)
LACTIC ACID, VENOUS: 0.54 mmol/L (ref 0.5–1.9)

## 2017-09-19 LAB — AEROBIC/ANAEROBIC CULTURE (SURGICAL/DEEP WOUND): CULTURE: NO GROWTH

## 2017-09-19 LAB — URINALYSIS, ROUTINE W REFLEX MICROSCOPIC
BILIRUBIN URINE: NEGATIVE
Glucose, UA: NEGATIVE mg/dL
HGB URINE DIPSTICK: NEGATIVE
KETONES UR: NEGATIVE mg/dL
Leukocytes, UA: NEGATIVE
Nitrite: NEGATIVE
PH: 7 (ref 5.0–8.0)
Protein, ur: NEGATIVE mg/dL
SPECIFIC GRAVITY, URINE: 1.012 (ref 1.005–1.030)

## 2017-09-19 LAB — COMPREHENSIVE METABOLIC PANEL
ALT: 45 U/L (ref 17–63)
AST: 37 U/L (ref 15–41)
Albumin: 3.2 g/dL — ABNORMAL LOW (ref 3.5–5.0)
Alkaline Phosphatase: 48 U/L (ref 38–126)
Anion gap: 9 (ref 5–15)
BUN: 14 mg/dL (ref 6–20)
CHLORIDE: 103 mmol/L (ref 101–111)
CO2: 23 mmol/L (ref 22–32)
Calcium: 9.5 mg/dL (ref 8.9–10.3)
Creatinine, Ser: 1.44 mg/dL — ABNORMAL HIGH (ref 0.61–1.24)
GFR, EST AFRICAN AMERICAN: 51 mL/min — AB (ref >60)
GFR, EST NON AFRICAN AMERICAN: 44 mL/min — AB (ref >60)
Glucose, Bld: 100 mg/dL — ABNORMAL HIGH (ref 65–99)
POTASSIUM: 3.7 mmol/L (ref 3.5–5.1)
SODIUM: 135 mmol/L (ref 135–145)
Total Bilirubin: 0.4 mg/dL (ref 0.3–1.2)
Total Protein: 6.7 g/dL (ref 6.5–8.1)

## 2017-09-19 MED ORDER — PREDNISONE 5 MG PO TABS
5.0000 mg | ORAL_TABLET | Freq: Every day | ORAL | Status: DC
  Administered 2017-09-20: 5 mg via ORAL
  Filled 2017-09-19: qty 1

## 2017-09-19 MED ORDER — SODIUM CHLORIDE 0.9% FLUSH: 3.0000 mL | INTRAVENOUS | Status: DC | PRN

## 2017-09-19 MED ORDER — ONDANSETRON HCL 4 MG PO TABS: 4.0000 mg | ORAL_TABLET | Freq: Three times a day (TID) | ORAL | Status: DC | PRN

## 2017-09-19 MED ORDER — HEPARIN SODIUM (PORCINE) 5000 UNIT/ML IJ SOLN
5000.0000 [IU] | Freq: Three times a day (TID) | INTRAMUSCULAR | Status: DC
  Administered 2017-09-19 – 2017-09-20 (×3): 5000 [IU] via SUBCUTANEOUS
  Filled 2017-09-19 (×3): qty 1

## 2017-09-19 MED ORDER — VITAMIN B-12 1000 MCG PO TABS
1000.0000 ug | ORAL_TABLET | Freq: Every day | ORAL | Status: DC
  Administered 2017-09-20: 1000 ug via ORAL
  Filled 2017-09-19: qty 1

## 2017-09-19 MED ORDER — SODIUM CHLORIDE 0.9 % IV SOLN: 250.0000 mL | INTRAVENOUS | Status: DC | PRN

## 2017-09-19 MED ORDER — SODIUM CHLORIDE 0.9% FLUSH: 3.0000 mL | Freq: Two times a day (BID) | INTRAVENOUS | Status: DC

## 2017-09-19 MED ORDER — OMEGA-3-ACID ETHYL ESTERS 1 G PO CAPS
1.0000 g | ORAL_CAPSULE | Freq: Every day | ORAL | Status: DC
  Administered 2017-09-20: 1 g via ORAL
  Filled 2017-09-19: qty 1

## 2017-09-19 MED ORDER — ATORVASTATIN CALCIUM 10 MG PO TABS
10.0000 mg | ORAL_TABLET | Freq: Every day | ORAL | Status: DC
  Administered 2017-09-19: 10 mg via ORAL
  Filled 2017-09-19 (×2): qty 1

## 2017-09-19 MED ORDER — ONDANSETRON HCL 4 MG/2ML IJ SOLN: 4.0000 mg | Freq: Four times a day (QID) | INTRAMUSCULAR | Status: DC | PRN

## 2017-09-19 MED ORDER — ONDANSETRON HCL 4 MG PO TABS: 4.0000 mg | ORAL_TABLET | Freq: Four times a day (QID) | ORAL | Status: DC | PRN

## 2017-09-19 MED ORDER — DEXTROSE 5 % IV SOLN
2.0000 g | INTRAVENOUS | Status: DC
  Administered 2017-09-20: 2 g via INTRAVENOUS
  Filled 2017-09-19: qty 2

## 2017-09-19 MED ORDER — POLYETHYLENE GLYCOL 3350 17 G PO PACK: 17.0000 g | PACK | Freq: Every day | ORAL | Status: DC | PRN

## 2017-09-19 MED ORDER — HYDROXYCHLOROQUINE SULFATE 200 MG PO TABS
200.0000 mg | ORAL_TABLET | Freq: Every day | ORAL | Status: DC
Start: 2017-09-20 — End: 2017-09-20
  Administered 2017-09-20: 200 mg via ORAL
  Filled 2017-09-19: qty 1

## 2017-09-19 MED ORDER — HYDROMORPHONE HCL 2 MG PO TABS
2.0000 mg | ORAL_TABLET | ORAL | Status: DC | PRN
  Administered 2017-09-19: 2 mg via ORAL
  Filled 2017-09-19: qty 1

## 2017-09-19 MED ORDER — SODIUM CHLORIDE 0.9 % IV BOLUS (SEPSIS)
1000.0000 mL | Freq: Once | INTRAVENOUS | Status: AC
  Administered 2017-09-19: 1000 mL via INTRAVENOUS

## 2017-09-19 MED ORDER — PANTOPRAZOLE SODIUM 40 MG PO TBEC
40.0000 mg | DELAYED_RELEASE_TABLET | Freq: Every day | ORAL | Status: DC
  Administered 2017-09-20: 40 mg via ORAL
  Filled 2017-09-19: qty 1

## 2017-09-19 MED ORDER — ACETAMINOPHEN 650 MG RE SUPP: 650.0000 mg | Freq: Four times a day (QID) | RECTAL | Status: DC | PRN

## 2017-09-19 MED ORDER — TRAZODONE HCL 50 MG PO TABS
50.0000 mg | ORAL_TABLET | Freq: Every evening | ORAL | Status: DC | PRN
  Administered 2017-09-20: 50 mg via ORAL
  Filled 2017-09-19: qty 1

## 2017-09-19 MED ORDER — CLOPIDOGREL BISULFATE 75 MG PO TABS
75.0000 mg | ORAL_TABLET | Freq: Every day | ORAL | Status: DC
  Administered 2017-09-20: 75 mg via ORAL
  Filled 2017-09-19: qty 1

## 2017-09-19 MED ORDER — FINASTERIDE 5 MG PO TABS
5.0000 mg | ORAL_TABLET | Freq: Every day | ORAL | Status: DC
  Administered 2017-09-20: 5 mg via ORAL
  Filled 2017-09-19: qty 1

## 2017-09-19 MED ORDER — ACETAMINOPHEN 325 MG PO TABS: 650.0000 mg | ORAL_TABLET | Freq: Four times a day (QID) | ORAL | Status: DC | PRN

## 2017-09-19 MED ORDER — ALBUTEROL SULFATE (2.5 MG/3ML) 0.083% IN NEBU: 2.5000 mg | INHALATION_SOLUTION | RESPIRATORY_TRACT | Status: DC | PRN

## 2017-09-19 MED ORDER — SENNA 8.6 MG PO TABS: 1.0000 | ORAL_TABLET | Freq: Two times a day (BID) | ORAL | Status: DC

## 2017-09-19 MED ORDER — ASPIRIN EC 81 MG PO TBEC
81.0000 mg | DELAYED_RELEASE_TABLET | Freq: Every day | ORAL | Status: DC
  Filled 2017-09-19: qty 1

## 2017-09-19 MED ORDER — SODIUM CHLORIDE 0.9 % IV SOLN
INTRAVENOUS | Status: DC
  Administered 2017-09-19: 19:00:00 via INTRAVENOUS
  Administered 2017-09-20: 100 mL/h via INTRAVENOUS

## 2017-09-19 MED ORDER — ADULT MULTIVITAMIN W/MINERALS CH
1.0000 | ORAL_TABLET | Freq: Every day | ORAL | Status: DC
  Administered 2017-09-20: 1 via ORAL
  Filled 2017-09-19: qty 1

## 2017-09-19 MED ORDER — FENTANYL CITRATE (PF) 100 MCG/2ML IJ SOLN
25.0000 ug | Freq: Once | INTRAMUSCULAR | Status: AC
  Administered 2017-09-19: 25 ug via INTRAVENOUS
  Filled 2017-09-19: qty 2

## 2017-09-19 MED ORDER — CEFTRIAXONE IV (FOR PTA / DISCHARGE USE ONLY): 2.0000 g | INTRAVENOUS | Status: DC

## 2017-09-19 NOTE — H&P (Addendum)
Patient Demographics:    Derrick [name], is a 83 y.o. male  MRN: [id number]   DOB - [birth year]  Admit Date - 09/19/2017  Outpatient Primary MD for the patient is [name], MD   Assessment & Plan:    Principal Problem:   Syncope and collapse Active Problems:   Hypertension   Hypotension   Volume depletion   AKI (acute kidney injury) ([place])    1)Syncope-suspect secondary to volume depletion due to decreased oral intake  in the setting of antihypertensive use,  place on telemetry monitored unit, watch for arrhythmias, check serial troponins and EKG for rule out ACS protocol, check echocardiogram to rule out significant aortic stenosis or other outflow obstruction, and also to evaluate EF and to rule out segmental/Regional wall motion abnormalities.  Stop amlodipine and Elavil both of which could be contributing to orthostatic hypotension  2)AKI-creatinine is up to 1.44, recent baseline 1.1 secondary to reduced oral intake and losartan/HCTZ use, hydrate IV n.p.o., avoid nephrotoxic agents, losartan HCTZ will be discontinued.   3)S/p Rt TMA of right foot-due to osteomyelitis/peripheral artery disease/nonhealing ulcers/wounds-continue IV Rocephin 2 g daily through 10/22/2017 as previously prescribed  4)BPH with some LUTs -continue Proscar  5)PAD-status post prior revascularization, status post amputation, continue aspirin, Plavix and give Lipitor. Even if his lipid panel is within desired limits, patient should still take Lipitor/Statin for it's Pleiotropic effects (beyond cholesterol lowering benefits)   With History of - Reviewed by me  Past Medical History:  Diagnosis Date  . Anemia   . Anxiety   . Arthritis, rheumatoid ([place])   . BPH (benign prostatic hyperplasia)   . CAD (coronary artery disease)   . CKD  (chronic kidney disease)   . COPD (chronic obstructive pulmonary disease) ([place])   . Depression   . Diverticulosis   . Elevated PSA   . Gangrene ([place])    of toe right foot  . GERD (gastroesophageal reflux disease)   . Herpes simplex labialis   . Hiatal hernia   . High cholesterol   . History of colonic polyps   . Hypertension   . Insomnia   . Lacunar infarction   . Memory loss   . Obesity   . Panic attacks   . Prostate cancer ([place])   . Renal cell cancer ([place])    s/p nephrectomy in 2000  . Seropositive rheumatoid arthritis ([place])   . Spermatocele   . Tinnitus   . Tremor of both hands       Past Surgical History:  Procedure Laterality Date  . AMPUTATION TOE Right 06/13/2017   Procedure: AMPUTATION TOE 1st and 2nd;  Surgeon: [name], DPM;  Location: [facility];  Service: Podiatry;  Laterality: Right;  . AMPUTATION TOE    . BACK SURGERY    . COLONOSCOPY    . I&D EXTREMITY Right 09/13/2017   Procedure: IRRIGATION AND DEBRIDEMENT AND BONE BIOPSY;  Surgeon: [name], DPM;  Location: [facility];  Service: Podiatry;  Laterality: Right;  . IR ANGIOGRAM EXTREMITY RIGHT  05/30/2017  . IR ANGIOGRAM EXTREMITY RIGHT  08/08/2017  . IR ANGIOGRAM SELECTIVE EACH ADDITIONAL VESSEL  08/08/2017  . IR ANGIOGRAM SELECTIVE EACH ADDITIONAL VESSEL  08/08/2017  . IR RADIOLOGIST EVAL & MGMT  05/16/2017  . IR TIB-PERO ART PTA MOD SED  05/30/2017  . IR TIB-PERO ART PTA MOD SED  08/08/2017  . IR TIB-PERO ART UNI PTA EA ADD VESSEL MOD SED  05/30/2017  . IR TIB-PERO ART UNI PTA EA ADD VESSEL MOD SED  08/08/2017  . IR US GUIDE VASC ACCESS RIGHT  05/30/2017  . IR US GUIDE VASC ACCESS RIGHT  08/08/2017  . LEFT HEART CATHETERIZATION WITH CORONARY ANGIOGRAM N/A 11/19/2011   Procedure: LEFT HEART CATHETERIZATION WITH CORONARY ANGIOGRAM;  Surgeon: [name], MD;  Location: [facility] C[facility] LAB;  Service: Cardiovascular;  Laterality: N/A;  . NEPHRECTOMY  10/1998  . TRANSMETATARSAL AMPUTATION Right 08/14/2017   Procedure:  TRANSMETATARSAL AMPUTATION;  Surgeon: [name], DPM;  Location: [facility];  Service: Podiatry;  Laterality: Right;      Chief Complaint  Patient presents with  . Hypotension      HPI:    Derrick [name]  is a 83 y.o. male, with hx of COPD, mild CKD, HL, HTN, prostate cancer, hx RCCa sp nephrectomy who underwent Rt TMA amputation of the right foot on 09/13/2017 by Dr. [name], discharged on 09/17/2017 on IV Rocephin through 10/22/2016.  Now presents with dizziness fatigue.  In ED... Patient was found to be very orthostatic and had near syncopal episode,  Systolic blood pressure was in the 70s-80s, his creatinine had bumped to 1.44 from his recent baseline of 1.1  Patient states he is compliant with losartan HCTZ, amlodipine and Elavil all of which is probably contributing to his orthostatic hypotension  Additional history obtained from patient wife and son at bedside, no chest pains, shortness of breath no pleuritic symptoms  No vomiting or diarrhea but does admit to poor oral intake  In ED... Despite IV fluids patient remained dizzy and orthostatic     Review of systems:    In addition to the HPI above,   A full 12 point Review of 10 Systems was done, except as stated above, all other Review of 10 Systems were negative.    Social History:  Reviewed by me    Social History   Tobacco Use  . Smoking status: Former Smoker    Packs/day: 1.00    Years: 29.00    Pack years: 29.00    Types: Cigarettes    Last attempt to quit: 10/08/1978    Years since quitting: 38.9  . Smokeless tobacco: Never Used  . Tobacco comment: tried smokeless tobacco once but made him sick  Substance Use Topics  . Alcohol use: No    Comment: drank very little in his life - only socially        Family History :  Reviewed by me    Family History  Problem Relation Age of Onset  . Heart attack Father 66  . Stroke Father   . Kidney failure Sister        Bright's disease   . Cancer Paternal Aunt          d. 64s; NOS cancer of leg  . Kidney disease Sister        d. 30; "Bright's disease" - possible kidney cancer?  . Cancer Sister  NOS cancer; d. 69s; "sick in the hospital"  . Heart attack Son 65  . Leukemia Son 58  . COPD Son   . Breast cancer Daughter 32     Home Medications:   Prior to Admission medications   Medication Sig Start Date End Date Taking? Authorizing Provider  amitriptyline (ELAVIL) 25 MG tablet Take 25 mg by mouth daily.   Yes [provider]  amLODipine (NORVASC) 10 MG tablet Take 10 mg by mouth daily.   Yes [provider]  aspirin EC 81 MG tablet Take 81 mg by mouth at bedtime.    Yes [provider]  cefTRIAXone (ROCEPHIN) IVPB Inject 2 g into the vein daily. Indication:  R-foot osteomyelitis Last Day of Therapy:  10/22/16 Labs - Once weekly:  CBC/D and BMP, Labs - Every other week:  ESR and CRP Patient taking differently: Inject 2 g into the vein daily. Indication:  R-foot osteomyelitis Last Day of Therapy:  10/22/16 Labs - Once weekly:  CBC/D and BMP, Labs - Every other week:  ESR and CRP  Pt started 09-19-17- therapy for 5 weeks 09/17/17  Yes Dessa Phi, DO  clopidogrel (PLAVIX) 75 MG tablet Take 75 mg by mouth daily. 08/10/17  Yes [provider]  finasteride (PROSCAR) 5 MG tablet Take 5 mg by mouth daily.   Yes [provider]  HYDROmorphone (DILAUDID) 2 MG tablet Take 1 tablet (2 mg total) by mouth every 4 (four) hours as needed for severe pain. 09/05/17  Yes [name], DPM  hydroxychloroquine (PLAQUENIL) 200 MG tablet Take 200 mg by mouth daily.   Yes [provider]  losartan-hydrochlorothiazide (HYZAAR) 100-12.5 MG tablet Take 1 tablet by mouth daily. 09/16/17  Yes [provider]  Multiple Vitamin (MULTIVITAMIN WITH MINERALS) TABS tablet Take 1 tablet by mouth daily.   Yes [provider]  Omega-3 Fatty Acids (FISH OIL) 1200 MG CAPS Take 2,400 mg by mouth daily.     Yes [provider]  pantoprazole (PROTONIX) 40 MG tablet Take 40 mg by mouth daily.    Yes [provider]  predniSONE (DELTASONE) 5 MG tablet Take 5 mg by mouth daily with breakfast.    Yes [provider]  vitamin B-12 (CYANOCOBALAMIN) 1000 MCG tablet Take 1,000 mcg by mouth daily.   Yes [provider]  ondansetron (ZOFRAN) 4 MG tablet Take 4 mg by mouth every 8 (eight) hours as needed for nausea or vomiting.    [provider]     Allergies:     Allergies  Allergen Reactions  . Ace Inhibitors Other (See Comments)    Reaction:  Unknown      Physical Exam:   Vitals  Blood pressure 105/66, pulse 90, temperature 97.7 F (36.5 C), temperature source Oral, resp. rate 17, height 6' (1.829 m), weight 83.9 kg (185 lb), SpO2 98 %.  Physical Examination: General appearance - alert, well appearing, and in no distress  Mental status - alert, oriented to person, place, and time,  Eyes - sclera anicteric Neck - supple, no JVD elevation , Chest - clear  to auscultation bilaterally, symmetrical air movement,  Heart - S1 and S2 normal,  Abdomen - soft, nontender, nondistended, no masses or organomegaly Neurological - screening mental status exam normal, neck supple without rigidity, cranial nerves II through XII intact, DTR's normal and symmetric Extremities -PICC line site is clean dry and intact, status post transmetatarsal amputation of right foot  Psych-affect is appropriate  Data Review:    CBC Recent Labs  Lab 09/14/17 0410 09/15/17 0404 09/16/17 0442 09/17/17 0552 09/19/17 1157  WBC 8.2 8.9 7.4 5.8 8.4  HGB 9.5* 8.6* 9.0* 9.4* 10.0*  HCT 28.5* 26.2* 27.4* 28.8* 31.0*  PLT 230 181 185 188 240  MCV 87.4 86.5 88.1 88.1 88.1  MCH 29.1 28.4 28.9 28.7 28.4  MCHC 33.3 32.8 32.8 32.6 32.3  RDW 15.5 15.4 15.9* 16.0* 16.1*  LYMPHSABS  --   --   --   --  2.2  MONOABS  --   --   --   --  1.0  EOSABS  --   --   --   --  0.8*   BASOSABS  --   --   --   --  0.0   ------------------------------------------------------------------------------------------------------------------  Chemistries  Recent Labs  Lab 09/14/17 0410 09/15/17 0404 09/16/17 0442 09/17/17 0552 09/19/17 1157  NA 137 136 136 136 135  K 3.7 3.2* 3.6 3.4* 3.7  CL 105 104 104 104 103  CO2 21* '24 26 22 23  ' GLUCOSE 116* 100* 91 87 100*  BUN '8 10 9 10 14  ' CREATININE 1.16 1.26* 1.15 1.19 1.44*  CALCIUM 8.9 8.4* 8.7* 9.0 9.5  MG  --   --  1.7  --   --   AST  --   --   --   --  37  ALT  --   --   --   --  45  ALKPHOS  --   --   --   --  48  BILITOT  --   --   --   --  0.4   ------------------------------------------------------------------------------------------------------------------ estimated creatinine clearance is 43.4 mL/min (A) (by C-G formula based on SCr of 1.44 mg/dL (H)). ------------------------------------------------------------------------------------------------------------------ No results for input(s): TSH, T4TOTAL, T3FREE, THYROIDAB in the last 72 hours.  Invalid input(s): FREET3   Coagulation profile No results for input(s): INR, PROTIME in the last 168 hours. ------------------------------------------------------------------------------------------------------------------- No results for input(s): DDIMER in the last 72 hours. -------------------------------------------------------------------------------------------------------------------  Cardiac Enzymes No results for input(s): CKMB, TROPONINI, MYOGLOBIN in the last 168 hours.  Invalid input(s): CK ------------------------------------------------------------------------------------------------------------------    Component Value Date/Time   BNP 33.7 12/05/2016 1300     ---------------------------------------------------------------------------------------------------------------  Urinalysis    Component Value Date/Time   COLORURINE YELLOW 09/19/2017  1422   APPEARANCEUR CLEAR 09/19/2017 1422   LABSPEC 1.012 09/19/2017 1422   PHURINE 7.0 09/19/2017 1422   GLUCOSEU NEGATIVE 09/19/2017 1422   HGBUR NEGATIVE 09/19/2017 1422   BILIRUBINUR NEGATIVE 09/19/2017 1422   KETONESUR NEGATIVE 09/19/2017 1422   PROTEINUR NEGATIVE 09/19/2017 1422   UROBILINOGEN 1.0 11/27/2013 0947   NITRITE NEGATIVE 09/19/2017 1422   LEUKOCYTESUR NEGATIVE 09/19/2017 1422    ----------------------------------------------------------------------------------------------------------------   Imaging Results:    Dg Chest 2 View  Result Date: 09/19/2017 CLINICAL DATA:  Hypotension at home. Dizziness and lightheadedness. Recent foot surgery for infection. EXAM: CHEST  2 VIEW COMPARISON:  09/09/2017 FINDINGS: Right arm PICC tip is in the SVC 3 cm above the right atrium. Heart size is normal. Aortic atherosclerosis. The pulmonary vascularity is normal. Lungs are clear. No effusions. No acute bone finding. IMPRESSION: No active disease. Aortic atherosclerosis. Right arm PICC tip SVC 3 cm above the right atrium. Electronically Signed   By: Nelson Chimes M.D.   On: 09/19/2017 13:47   Dg Foot Complete Right  Result Date: 09/19/2017 CLINICAL DATA:  Hypotension.  Recent surgery for infection. EXAM: RIGHT FOOT  COMPLETE - 3+ VIEW COMPARISON:  09/13/2017 FINDINGS: Complete forefoot amputation at the level of the proximal metatarsals. Surgical margins appear sharp. No evidence of bone destruction in the residual tissue. Diminished swelling and air in the soft tissues. IMPRESSION: No unexpected finding. Forefoot amputation. Diminishing swelling and air in the soft tissues. Electronically Signed   By: Nelson Chimes M.D.   On: 09/19/2017 13:48    Radiological Exams on Admission: Dg Chest 2 View  Result Date: 09/19/2017 CLINICAL DATA:  Hypotension at home. Dizziness and lightheadedness. Recent foot surgery for infection. EXAM: CHEST  2 VIEW COMPARISON:  09/09/2017 FINDINGS: Right arm  PICC tip is in the SVC 3 cm above the right atrium. Heart size is normal. Aortic atherosclerosis. The pulmonary vascularity is normal. Lungs are clear. No effusions. No acute bone finding. IMPRESSION: No active disease. Aortic atherosclerosis. Right arm PICC tip SVC 3 cm above the right atrium. Electronically Signed   By: Nelson Chimes M.D.   On: 09/19/2017 13:47   Dg Foot Complete Right  Result Date: 09/19/2017 CLINICAL DATA:  Hypotension.  Recent surgery for infection. EXAM: RIGHT FOOT COMPLETE - 3+ VIEW COMPARISON:  09/13/2017 FINDINGS: Complete forefoot amputation at the level of the proximal metatarsals. Surgical margins appear sharp. No evidence of bone destruction in the residual tissue. Diminished swelling and air in the soft tissues. IMPRESSION: No unexpected finding. Forefoot amputation. Diminishing swelling and air in the soft tissues. Electronically Signed   By: Nelson Chimes M.D.   On: 09/19/2017 13:48    DVT Prophylaxis -SCD/heparin AM Labs Ordered, also please review Full Orders  Family Communication: Admission, patients condition and plan of care including tests being ordered have been discussed with the patient and son/wife who indicate understanding and agree with the plan   Code Status - Full Code  Likely DC to  Home in am   Condition   stable  Roxan Hockey M.D on 09/19/2017 at 5:59 PM   Between 7am to 7pm - Pager - [phone] After 7pm go to [email or web address] - password [email or web address]  [facility] - Office  [phone]  Voice Recognition Viviann Spare dictation system was used to create this note, attempts have been made to correct errors. Please contact the author with questions and/or clarifications.

## 2017-09-19 NOTE — ED Notes (Signed)
Patient transported to X-ray 

## 2017-09-19 NOTE — ED Provider Notes (Signed)
[facility] EMERGENCY DEPARTMENT Provider Note   CSN: [id number] Arrival date & time: 09/19/17  1149     History   Chief Complaint Chief Complaint  Patient presents with  . Hypotension    HPI HOA DERISO is a 83 y.o. male.  The history is provided by the patient, the spouse, a relative and medical records. No language interpreter was used.  Illness  This is a new problem. The current episode started 2 days ago. The problem occurs constantly. The problem has been gradually worsening. Pertinent negatives include no chest pain, no abdominal pain, no headaches and no shortness of breath. Associated symptoms comments: Cough FAtigue . Nothing aggravates the symptoms. Nothing relieves the symptoms. He has tried nothing for the symptoms. The treatment provided no relief.  Cough  This is a new problem. The current episode started 2 days ago. The problem occurs constantly. The problem has not changed since onset.The cough is non-productive. There has been no fever. Pertinent negatives include no chest pain, no chills, no headaches, no rhinorrhea, no shortness of breath and no wheezing. He has tried nothing for the symptoms. The treatment provided no relief.    Past Medical History:  Diagnosis Date  . Anemia   . Anxiety   . Arthritis, rheumatoid ([place])   . BPH (benign prostatic hyperplasia)   . CAD (coronary artery disease)   . CKD (chronic kidney disease)   . COPD (chronic obstructive pulmonary disease) ([place])   . Depression   . Diverticulosis   . Elevated PSA   . Gangrene ([place])    of toe right foot  . GERD (gastroesophageal reflux disease)   . Herpes simplex labialis   . Hiatal hernia   . High cholesterol   . History of colonic polyps   . Hypertension   . Insomnia   . Lacunar infarction   . Memory loss   . Obesity   . Panic attacks   . Prostate cancer ([place])   . Renal cell cancer ([place])    s/p nephrectomy in 2000  . Seropositive rheumatoid arthritis ([place])    . Spermatocele   . Tinnitus   . Tremor of both hands     Patient Active Problem List   Diagnosis Date Noted  . Osteomyelitis ([place])   . Malaise 09/09/2017  . Anorexia 09/09/2017  . Hypotension 09/09/2017  . Volume depletion 09/09/2017  . AKI (acute kidney injury) ([place]) 09/09/2017  . Syncope 09/09/2017  . Pain of right great toe 06/12/2017  . Hyponatremia 06/12/2017  . COPD (chronic obstructive pulmonary disease) ([place])   . Lacunar infarction   . Gangrene of toe of right foot ([place])   . Pre-syncope 11/21/2016  . Dyspnea on exertion 03/03/2016  . H/O unilateral nephrectomy 03/03/2016  . Arrhythmia 11/09/2015  . CAD (coronary artery disease) 11/09/2015  . Abnormal ECG 10/22/2013  . Shortness of breath 10/22/2013  . Chest pain at rest 11/18/2011  . Unstable angina ([place]) 11/18/2011  . Thrombocytopenia ([place]) 11/18/2011  . Anemia 11/18/2011  . CKD (chronic kidney disease), stage III ([place]) 11/18/2011  . Hypertension   . High cholesterol   . GERD (gastroesophageal reflux disease)   . BPH (benign prostatic hyperplasia)     Past Surgical History:  Procedure Laterality Date  . AMPUTATION TOE Right 06/13/2017   Procedure: AMPUTATION TOE 1st and 2nd;  Surgeon: [name], DPM;  Location: [facility];  Service: Podiatry;  Laterality: Right;  . AMPUTATION TOE    .  BACK SURGERY    . COLONOSCOPY    . I&D EXTREMITY Right 09/13/2017   Procedure: IRRIGATION AND DEBRIDEMENT AND BONE BIOPSY;  Surgeon: [name], DPM;  Location: [facility];  Service: Podiatry;  Laterality: Right;  . IR ANGIOGRAM EXTREMITY RIGHT  05/30/2017  . IR ANGIOGRAM EXTREMITY RIGHT  08/08/2017  . IR ANGIOGRAM SELECTIVE EACH ADDITIONAL VESSEL  08/08/2017  . IR ANGIOGRAM SELECTIVE EACH ADDITIONAL VESSEL  08/08/2017  . IR RADIOLOGIST EVAL & MGMT  05/16/2017  . IR TIB-PERO ART PTA MOD SED  05/30/2017  . IR TIB-PERO ART PTA MOD SED  08/08/2017  . IR TIB-PERO ART UNI PTA EA ADD VESSEL MOD SED  05/30/2017  . IR TIB-PERO ART UNI  PTA EA ADD VESSEL MOD SED  08/08/2017  . IR US GUIDE VASC ACCESS RIGHT  05/30/2017  . IR US GUIDE VASC ACCESS RIGHT  08/08/2017  . LEFT HEART CATHETERIZATION WITH CORONARY ANGIOGRAM N/A 11/19/2011   Procedure: LEFT HEART CATHETERIZATION WITH CORONARY ANGIOGRAM;  Surgeon: [name], MD;  Location: [facility] CATH LAB;  Service: Cardiovascular;  Laterality: N/A;  . NEPHRECTOMY  10/1998  . TRANSMETATARSAL AMPUTATION Right 08/14/2017   Procedure: TRANSMETATARSAL AMPUTATION;  Surgeon: [name], DPM;  Location: [facility];  Service: Podiatry;  Laterality: Right;       Home Medications    Prior to Admission medications   Medication Sig Start Date End Date Taking? Authorizing Provider  amitriptyline (ELAVIL) 25 MG tablet Take 25 mg by mouth daily.    [provider]  amLODipine (NORVASC) 10 MG tablet Take 10 mg by mouth daily.    [provider]  aspirin EC 81 MG tablet Take 81 mg by mouth at bedtime.     [provider]  cefTRIAXone (ROCEPHIN) IVPB Inject 2 g into the vein daily. Indication:  R-foot osteomyelitis Last Day of Therapy:  10/22/16 Labs - Once weekly:  CBC/D and BMP, Labs - Every other week:  ESR and CRP 09/17/17   Dessa Phi, DO  clopidogrel (PLAVIX) 75 MG tablet Take 75 mg by mouth daily. 08/10/17   [provider]  finasteride (PROSCAR) 5 MG tablet Take 5 mg by mouth daily.    [provider]  HYDROmorphone (DILAUDID) 2 MG tablet Take 1 tablet (2 mg total) by mouth every 4 (four) hours as needed for severe pain. 09/05/17   [name], DPM  hydroxychloroquine (PLAQUENIL) 200 MG tablet Take 200 mg by mouth daily.    [provider]  Multiple Vitamin (MULTIVITAMIN WITH MINERALS) TABS tablet Take 1 tablet by mouth daily.    [provider]  Omega-3 Fatty Acids (FISH OIL) 1200 MG CAPS Take 2,400 mg by mouth daily.     [provider]  ondansetron (ZOFRAN) 4 MG tablet Take 4 mg by mouth every 8 (eight) hours  as needed for nausea or vomiting.    [provider]  pantoprazole (PROTONIX) 40 MG tablet Take 40 mg by mouth 2 (two) times daily.    [provider]  predniSONE (DELTASONE) 5 MG tablet Take 5 mg by mouth daily with breakfast.     [provider]  vitamin B-12 (CYANOCOBALAMIN) 1000 MCG tablet Take 1,000 mcg by mouth daily.    [provider]    Family History Family History  Problem Relation Age of Onset  . Heart attack Father 34  . Stroke Father   . Kidney failure Sister        Bright's  disease   . Cancer Paternal Aunt        d. 6s; NOS cancer of leg  . Kidney disease Sister        d. 63; "Bright's disease" - possible kidney cancer?  . Cancer Sister        NOS cancer; d. 58s; "sick in the hospital"  . Heart attack Son 54  . Leukemia Son 78  . COPD Son   . Breast cancer Daughter 40    Social History Social History   Tobacco Use  . Smoking status: Former Smoker    Packs/day: 1.00    Years: 29.00    Pack years: 29.00    Types: Cigarettes    Last attempt to quit: 10/08/1978    Years since quitting: 38.9  . Smokeless tobacco: Never Used  . Tobacco comment: tried smokeless tobacco once but made him sick  Substance Use Topics  . Alcohol use: No    Comment: drank very little in his life - only socially   . Drug use: No     Allergies   Ace inhibitors   Review of Systems Review of Systems  Constitutional: Positive for fatigue. Negative for chills, diaphoresis and fever.  HENT: Negative for congestion and rhinorrhea.   Eyes: Negative for visual disturbance.  Respiratory: Positive for cough. Negative for chest tightness, shortness of breath, wheezing and stridor.   Cardiovascular: Negative for chest pain, palpitations and leg swelling.  Gastrointestinal: Negative for abdominal pain, constipation, diarrhea, nausea and vomiting.  Genitourinary: Negative for enuresis, flank pain and frequency.  Musculoskeletal: Negative for back pain and  neck pain.  Skin: Positive for rash and wound.  Neurological: Positive for light-headedness. Negative for syncope, numbness and headaches.  Psychiatric/Behavioral: Negative for agitation.  All other systems reviewed and are negative.    Physical Exam Updated Vital Signs BP 90/77 (BP Location: Left Arm)   Pulse 98   Temp 97.7 F (36.5 C) (Oral)   Resp 16   Ht 6' (1.829 m)   Wt 83.9 kg (185 lb)   SpO2 100%   BMI 25.09 kg/m   Physical Exam  Constitutional: He is oriented to person, place, and time. He appears well-developed and well-nourished. No distress.  HENT:  Head: Normocephalic.  Mouth/Throat: Oropharynx is clear and moist. No oropharyngeal exudate.  Eyes: Conjunctivae and EOM are normal. Pupils are equal, round, and reactive to light.  Neck: Normal range of motion.  Cardiovascular: Normal rate and intact distal pulses.  No murmur heard. Pulmonary/Chest: Effort normal. He has no wheezes. He has no rales. He exhibits no tenderness.  Musculoskeletal: He exhibits tenderness.       Right foot: There is tenderness.       Feet:  Neurological: He is alert and oriented to person, place, and time. No sensory deficit. He exhibits normal muscle tone.  Skin: Skin is warm. Capillary refill takes less than 2 seconds. He is not diaphoretic. There is erythema. No pallor.  Nursing note and vitals reviewed.       ED Treatments / Results  Labs (all labs ordered are listed, but only abnormal results are displayed) Labs Reviewed  COMPREHENSIVE METABOLIC PANEL - Abnormal; Notable for the following components:      Result Value   Glucose, Bld 100 (*)    Creatinine, Ser 1.44 (*)    Albumin 3.2 (*)    GFR calc non Af Amer 44 (*)    GFR calc Af Amer 51 (*)    All  other components within normal limits  CBC WITH DIFFERENTIAL/PLATELET - Abnormal; Notable for the following components:   RBC 3.52 (*)    Hemoglobin 10.0 (*)    HCT 31.0 (*)    RDW 16.1 (*)    Eosinophils Absolute 0.8  (*)    All other components within normal limits  CULTURE, BLOOD (ROUTINE X 2)  CULTURE, BLOOD (ROUTINE X 2)  URINE CULTURE  URINALYSIS, ROUTINE W REFLEX MICROSCOPIC  BASIC METABOLIC PANEL  CBC  I-STAT CG4 LACTIC ACID, ED  I-STAT CG4 LACTIC ACID, ED    EKG  EKG Interpretation  Date/Time:  Thursday September 19 2017 11:56:37 EST Ventricular Rate:  117 PR Interval:    QRS Duration: 94 QT Interval:  324 QTC Calculation: 451 R Axis:   38 Text Interpretation:  Sinus tachycardia with Premature atrial complexes Otherwise normal ECG When compared to prior, similar tachycardia with arrythmias. No STEMI Confirmed by Antony Blackbird [phone]) on 09/19/2017 12:07:37 PM Also confirmed by Antony Blackbird [phone]), editor Laurena Spies [phone])  on 09/19/2017 12:17:25 PM       Radiology Dg Chest 2 View  Result Date: 09/19/2017 CLINICAL DATA:  Hypotension at home. Dizziness and lightheadedness. Recent foot surgery for infection. EXAM: CHEST  2 VIEW COMPARISON:  09/09/2017 FINDINGS: Right arm PICC tip is in the SVC 3 cm above the right atrium. Heart size is normal. Aortic atherosclerosis. The pulmonary vascularity is normal. Lungs are clear. No effusions. No acute bone finding. IMPRESSION: No active disease. Aortic atherosclerosis. Right arm PICC tip SVC 3 cm above the right atrium. Electronically Signed   By: Nelson Chimes M.D.   On: 09/19/2017 13:47   Dg Foot Complete Right  Result Date: 09/19/2017 CLINICAL DATA:  Hypotension.  Recent surgery for infection. EXAM: RIGHT FOOT COMPLETE - 3+ VIEW COMPARISON:  09/13/2017 FINDINGS: Complete forefoot amputation at the level of the proximal metatarsals. Surgical margins appear sharp. No evidence of bone destruction in the residual tissue. Diminished swelling and air in the soft tissues. IMPRESSION: No unexpected finding. Forefoot amputation. Diminishing swelling and air in the soft tissues. Electronically Signed   By: Nelson Chimes M.D.   On: 09/19/2017  13:48    Procedures Procedures (including critical care time)  Medications Ordered in ED Medications  finasteride (PROSCAR) tablet 5 mg (not administered)  aspirin EC tablet 81 mg (not administered)  predniSONE (DELTASONE) tablet 5 mg (not administered)  pantoprazole (PROTONIX) EC tablet 40 mg (not administered)  vitamin B-12 (CYANOCOBALAMIN) tablet 1,000 mcg (not administered)  omega-3 acid ethyl esters (LOVAZA) capsule 1 g (not administered)  HYDROmorphone (DILAUDID) tablet 2 mg (not administered)  multivitamin with minerals tablet 1 tablet (not administered)  clopidogrel (PLAVIX) tablet 75 mg (not administered)  hydroxychloroquine (PLAQUENIL) tablet 200 mg (not administered)  cefTRIAXone (ROCEPHIN) IVPB (not administered)  sodium chloride flush (NS) 0.9 % injection 3 mL (not administered)  sodium chloride flush (NS) 0.9 % injection 3 mL (not administered)  0.9 %  sodium chloride infusion (not administered)  acetaminophen (TYLENOL) tablet 650 mg (not administered)    Or  acetaminophen (TYLENOL) suppository 650 mg (not administered)  traZODone (DESYREL) tablet 50 mg (not administered)  senna (SENOKOT) tablet 8.6 mg (not administered)  polyethylene glycol (MIRALAX / GLYCOLAX) packet 17 g (not administered)  ondansetron (ZOFRAN) tablet 4 mg (not administered)    Or  ondansetron (ZOFRAN) injection 4 mg (not administered)  albuterol (PROVENTIL) (2.5 MG/3ML) 0.083% nebulizer solution 2.5 mg (not administered)  heparin injection 5,000 Units (not administered)  0.9 %  sodium chloride infusion ( Intravenous New Bag/Given 09/19/17 1900)  atorvastatin (LIPITOR) tablet 10 mg (not administered)  sodium chloride 0.9 % bolus 1,000 mL (0 mLs Intravenous Stopped 09/19/17 1349)  fentaNYL (SUBLIMAZE) injection 25 mcg (25 mcg Intravenous Given 09/19/17 1256)  sodium chloride 0.9 % bolus 1,000 mL (0 mLs Intravenous Stopped 09/19/17 1747)     Initial Impression / Assessment and Plan / ED Course    I have reviewed the triage vital signs and the nursing notes.  Pertinent labs & imaging results that were available during my care of the patient were reviewed by me and considered in my medical decision making (see chart for details).     Derrick [name] is a 83 y.o. male with a past medical history significant for CAD, CKD, hypertension, stroke, prostate cancer, renal cell carcinoma status post colectomy, osteomyelitis of the right foot status post developing of gangrene and partial amputation currently on IV antibiotic therapy who presents to the direction of his home health nurse for cough, fatigue, and hypotension.  Patient reports that he was discharged 2 days ago from this facility for further workup of hypotension and syncope as he has been treating his foot infection and amputation.  He reports he is still on the IV Rocephin therapy.  He reports that his wound is still draining some purulence but it is not as painful as it was before.  It is minimally painful with some redness.  He reports that he has developed a cough over the last 2 days that is nonproductive.  He denies any chest pain or shortness of breath.  He denies fevers or chills but reports severe fatigue.  He also reports some lightheadedness but no syncopal episodes at this time.  He was found to have a blood pressure of 90/60 by home health and the PCP told him to come in.  Patient's blood pressure was 90/77 on arrival to the ED.  Patient otherwise denies any abdominal pain, no diarrhea, or urinary symptoms.  On exam, patient's right foot has the amputation wound present.  There are staples in place and a small amount of purulent drainage in the lateral aspect.  Minimal tenderness.  No significant crepitance.  Patient's ankle and leg are otherwise nontender.  Abdomen nontender and chest nontender.  Lungs are clear on my exam.  Patient did have a deep cough during exam.  Patient otherwise appears well.  Patient was given fluids for  hypotension.  Patient will workup for occult infection given his recent admissions.  Patient will x-ray the foot to look for worsening osteomyelitis or subcutaneous air.  Anticipate reassessment after workup.      4:19 PM Patient's diagnostic testing overall was reassuring.  Urinalysis showed no infection.  X-ray of the foot showed no evidence of ostium myelitis or worsened infection.  Chest x-ray was negative for pneumonia.  Lactic acid negative x2.  Metabolic panel showed increase in creatinine likely suggestive of dehydration.  Borderline AK I.  The mild anemia but no leukocytosis.  I do not think patient has worsened infection however I think patient is dehydrated.  Next  After receiving 1 L of fluids, patient's blood pressure had improved.  Orthostatics were checked and patient became symptomatic and his blood pressure dropped into the 70s.  Given this significant drop, I am concerned about patient safely being able to go home.  Hospitalist will be called for observation and rehydration given the positive orthostatics.   Final Clinical Impressions(s) /  ED Diagnoses   Final diagnoses:  Hypotension, unspecified hypotension type  Dehydration    ED Discharge Orders    None      Clinical Impression: 1. Hypotension, unspecified hypotension type   2. Dehydration     Disposition: Admit  This note was prepared with assistance of Dragon voice recognition software. Occasional wrong-word or sound-a-like substitutions may have occurred due to the inherent limitations of voice recognition software.     Gennie Eisinger, Gwenyth Allegra, MD 09/19/17 2025

## 2017-09-19 NOTE — Telephone Encounter (Signed)
Left message that Dr. [name] requested me to schedule him an appointment for a post-op since he had surgery last Friday. I scheduled him for 1:45 pm tomorrow and told them to call me back at [phone] if that time does not work.

## 2017-09-19 NOTE — ED Triage Notes (Signed)
Pt's HHRN reported low BP to family- 90/60; pt called primary MD who recommended he come to ED. Pt complaining of some dizziness/lightheadedness since yesterday.  Pt had recent surgery on right foot for an infection. Pt was d/ced this past Tuesday. Denies CP, endorses some SOB.

## 2017-09-20 ENCOUNTER — Observation Stay (HOSPITAL_BASED_OUTPATIENT_CLINIC_OR_DEPARTMENT_OTHER): Payer: Medicare Other

## 2017-09-20 ENCOUNTER — Encounter: Payer: Medicare Other | Admitting: Podiatry

## 2017-09-20 DIAGNOSIS — N179 Acute kidney failure, unspecified: Secondary | ICD-10-CM | POA: Diagnosis not present

## 2017-09-20 DIAGNOSIS — E860 Dehydration: Secondary | ICD-10-CM | POA: Diagnosis not present

## 2017-09-20 DIAGNOSIS — I340 Nonrheumatic mitral (valve) insufficiency: Secondary | ICD-10-CM

## 2017-09-20 DIAGNOSIS — I10 Essential (primary) hypertension: Secondary | ICD-10-CM | POA: Diagnosis not present

## 2017-09-20 DIAGNOSIS — E869 Volume depletion, unspecified: Secondary | ICD-10-CM

## 2017-09-20 DIAGNOSIS — R55 Syncope and collapse: Secondary | ICD-10-CM | POA: Diagnosis not present

## 2017-09-20 LAB — BASIC METABOLIC PANEL
Anion gap: 10 (ref 5–15)
BUN: 11 mg/dL (ref 6–20)
CO2: 18 mmol/L — ABNORMAL LOW (ref 22–32)
CREATININE: 1.13 mg/dL (ref 0.61–1.24)
Calcium: 8.6 mg/dL — ABNORMAL LOW (ref 8.9–10.3)
Chloride: 108 mmol/L (ref 101–111)
GFR calc Af Amer: >60 mL/min (ref >60)
GFR, EST NON AFRICAN AMERICAN: 59 mL/min — AB (ref >60)
Glucose, Bld: 83 mg/dL (ref 65–99)
Potassium: 3.9 mmol/L (ref 3.5–5.1)
SODIUM: 136 mmol/L (ref 135–145)

## 2017-09-20 LAB — CBC
HEMATOCRIT: 26.5 % — AB (ref 39.0–52.0)
Hemoglobin: 8.6 g/dL — ABNORMAL LOW (ref 13.0–17.0)
MCH: 28.8 pg (ref 26.0–34.0)
MCHC: 32.5 g/dL (ref 30.0–36.0)
MCV: 88.6 fL (ref 78.0–100.0)
PLATELETS: 181 10*3/uL (ref 150–400)
RBC: 2.99 MIL/uL — ABNORMAL LOW (ref 4.22–5.81)
RDW: 16.3 % — AB (ref 11.5–15.5)
WBC: 5.2 10*3/uL (ref 4.0–10.5)

## 2017-09-20 LAB — ECHOCARDIOGRAM COMPLETE
HEIGHTINCHES: 72 in
Weight: 2718.4 oz

## 2017-09-20 LAB — URINE CULTURE: Culture: NO GROWTH

## 2017-09-20 MED ORDER — HEPARIN SOD (PORK) LOCK FLUSH 100 UNIT/ML IV SOLN
250.0000 [IU] | INTRAVENOUS | Status: AC | PRN
  Administered 2017-09-20: 250 [IU]

## 2017-09-20 NOTE — Progress Notes (Signed)
  Echocardiogram 2D Echocardiogram has been performed.  Jennette Dubin 09/20/2017, 9:44 AM

## 2017-09-20 NOTE — Care Management Obs Status (Signed)
[facility] NOTIFICATION   Patient Details  Name: DEKLEN POPELKA MRN: [id number] Date of Birth: 09/15/1932   Medicare Observation Status Notification Given:  Yes    Carles Collet, RN 09/20/2017, 3:06 PM

## 2017-09-20 NOTE — Progress Notes (Signed)
Patient taken for ECHO.

## 2017-09-20 NOTE — Plan of Care (Signed)
  Progressing Activity: Risk for activity intolerance will decrease 09/20/2017 0528 - Progressing by Ruben Im, RN Nutrition: Adequate nutrition will be maintained 09/20/2017 0528 - Progressing by Ruben Im, RN Pain Managment: General experience of comfort will improve 09/20/2017 0528 - Progressing by Ruben Im, RN Safety: Ability to remain free from injury will improve 09/20/2017 0528 - Progressing by Ruben Im, RN Skin Integrity: Risk for impaired skin integrity will decrease 09/20/2017 0528 - Progressing by Ruben Im, RN

## 2017-09-20 NOTE — Consult Note (Signed)
WOC consulted for wound care, noted [facility] nurse had consulted prior to podiatry. Podiatry has taken patient back to surgery and performed debridement with closure and placement of a incisional disposable NPWT device. Orders updated to reflect care of this wound.     Re consult if needed, will not follow at this time. Thanks  Masaye Gatchalian [employer], RN,CWOCN, CNS, [place] [phone])

## 2017-09-20 NOTE — Progress Notes (Signed)
Discharged for home via transport by patient's son.  All discharge instructions reviewed with patient and his wife and both stated understanding.  No voiced complaints.

## 2017-09-20 NOTE — Plan of Care (Signed)
  Completed/Met Education: Knowledge of General Education information will improve 09/20/2017 1448 - Completed/Met by Alonna Buckler, RN Health Behavior/Discharge Planning: Ability to manage health-related needs will improve 09/20/2017 1448 - Completed/Met by Alonna Buckler, RN Clinical Measurements: Ability to maintain clinical measurements within normal limits will improve 09/20/2017 1448 - Completed/Met by Alonna Buckler, RN Will remain free from infection 09/20/2017 1448 - Completed/Met by Alonna Buckler, RN Diagnostic test results will improve 09/20/2017 1448 - Completed/Met by Alonna Buckler, RN Respiratory complications will improve 09/20/2017 1448 - Completed/Met by Alonna Buckler, RN Cardiovascular complication will be avoided 09/20/2017 1448 - Completed/Met by Alonna Buckler, RN Activity: Risk for activity intolerance will decrease 09/20/2017 1448 - Completed/Met by Alonna Buckler, RN Nutrition: Adequate nutrition will be maintained 09/20/2017 1448 - Completed/Met by Alonna Buckler, RN Coping: Level of anxiety will decrease 09/20/2017 1448 - Completed/Met by Alonna Buckler, RN Elimination: Will not experience complications related to bowel motility 09/20/2017 1448 - Completed/Met by Alonna Buckler, RN Will not experience complications related to urinary retention 09/20/2017 1448 - Completed/Met by Alonna Buckler, RN Pain Managment: General experience of comfort will improve 09/20/2017 1448 - Completed/Met by Alonna Buckler, RN Safety: Ability to remain free from injury will improve 09/20/2017 1448 - Completed/Met by Alonna Buckler, RN Skin Integrity: Risk for impaired skin integrity will decrease 09/20/2017 1448 - Completed/Met by Alonna Buckler, RN

## 2017-09-20 NOTE — Evaluation (Signed)
Physical Therapy Evaluation Patient Details Name: Derrick [name] MRN: [id number] DOB: 08/16/1932 Today's Date: 09/20/2017   History of Present Illness  Pt is an 83 y/o male presenting after episode of syncope. Pt also with low BP prior to admission. PMH includes COPD, CAD, CKD, HTN, renal cell cancer s/p nephrectomy, prostate cancer, back surgery, and R transmetatarsal amputation.   Clinical Impression  Pt admitted secondary to problem above with deficits below. PTA, pt was using RW for ambulation. Upon eval, pt presenting with weakness, decreased balance, and decreased cognition. Pt tolerated ambulation with RW well, however, was unsteady and required safety cues for safe use of RW. Required min to min guard assist for ambulation with RW. Educated pt's wife about safety with mobility at home. Wife reports she or her son's will be available 24/7 for home. Recommending continuation of HHPT services to address mobility deficits. Will continue to follow acutely to maximize functional mobility independence and safety.     Follow Up Recommendations Home health PT;Supervision/Assistance - 24 hour    Equipment Recommendations  None recommended by PT    Recommendations for Other Services       Precautions / Restrictions Precautions Precautions: Fall Required Braces or Orthoses: Other Brace/Splint Other Brace/Splint: Post op shoe for R foot  Restrictions Weight Bearing Restrictions: No      Mobility  Bed Mobility Overal bed mobility: Needs Assistance Bed Mobility: Supine to Sit;Sit to Supine     Supine to sit: Supervision Sit to supine: Supervision   General bed mobility comments: Supervision for safety.   Transfers Overall transfer level: Needs assistance Equipment used: Rolling walker (2 wheeled) Transfers: Sit to/from Stand Sit to Stand: Min guard         General transfer comment: min guard for safety  Ambulation/Gait Ambulation/Gait assistance: Min assist;Min  guard Ambulation Distance (Feet): 400 Feet Assistive device: Rolling walker (2 wheeled) Gait Pattern/deviations: Decreased step length - right;Decreased weight shift to right;Narrow base of support Gait velocity: Decreased Gait velocity interpretation: Below normal speed for age/gender General Gait Details: Slow, unsteady gait. Required safety cues for safe use of RW and for upright posture and proximity to device. Educated pt's wife about safety with mobility at home. Also educated pt to take his time with RW to increase safety with mobility.   Stairs            Wheelchair Mobility    Modified Rankin (Stroke Patients Only)       Balance Overall balance assessment: Needs assistance Sitting-balance support: No upper extremity supported;Feet supported Sitting balance-Leahy Scale: Good     Standing balance support: Bilateral upper extremity supported;During functional activity Standing balance-Leahy Scale: Poor Standing balance comment: Reliant on UE support                             Pertinent Vitals/Pain Pain Assessment: No/denies pain    Home Living Family/patient expects to be discharged to:: Private residence Living Arrangements: Spouse/significant other Available Help at Discharge: Family;Available 24 hours/day Type of Home: House Home Access: Stairs to enter Entrance Stairs-Rails: Right;Left;Can reach both Entrance Stairs-Number of Steps: 2 Home Layout: One level Home Equipment: Clinical cytogeneticist - 2 wheels;Cane - single point;Wheelchair - manual;Bedside commode      Prior Function Level of Independence: Independent with assistive device(s)         Comments: Used RW for mobility      Hand Dominance   Dominant Hand: Right  Extremity/Trunk Assessment   Upper Extremity Assessment Upper Extremity Assessment: Defer to OT evaluation    Lower Extremity Assessment Lower Extremity Assessment: RLE deficits/detail;Generalized weakness RLE  Deficits / Details: R transmetatarsal amputation. Badaged and wife reports RN comes to dress it every week.     Cervical / Trunk Assessment Cervical / Trunk Assessment: Kyphotic  Communication   Communication: No difficulties  Cognition Arousal/Alertness: Awake/alert Behavior During Therapy: WFL for tasks assessed/performed Overall Cognitive Status: Impaired/Different from baseline Area of Impairment: Attention;Memory;Safety/judgement                   Current Attention Level: Selective Memory: Decreased short-term memory   Safety/Judgement: Decreased awareness of deficits;Decreased awareness of safety            General Comments General comments (skin integrity, edema, etc.): Pt's wife present during session. Reports husband is getting HHPT. Educated about resuming HHPT and pt and wife agreeable. Educated about safe stair management at home     Exercises     Assessment/Plan    PT Assessment Patient needs continued PT services  PT Problem List Decreased strength;Decreased balance;Decreased mobility;Decreased cognition;Decreased knowledge of use of DME;Decreased safety awareness;Decreased knowledge of precautions       PT Treatment Interventions DME instruction;Gait training;Stair training;Functional mobility training;Therapeutic activities;Therapeutic exercise;Balance training;Neuromuscular re-education;Patient/family education;Cognitive remediation    PT Goals (Current goals can be found in the Care Plan section)  Acute Rehab PT Goals Patient Stated Goal: to go home  PT Goal Formulation: With patient Time For Goal Achievement: 09/27/17 Potential to Achieve Goals: Good    Frequency Min 3X/week   Barriers to discharge        Co-evaluation               AM-PAC PT "6 Clicks" Daily Activity  Outcome Measure Difficulty turning over in bed (including adjusting bedclothes, sheets and blankets)?: None Difficulty moving from lying on back to sitting on the  side of the bed? : A Little Difficulty sitting down on and standing up from a chair with arms (e.g., wheelchair, bedside commode, etc,.)?: Unable Help needed moving to and from a bed to chair (including a wheelchair)?: A Little Help needed walking in hospital room?: A Little Help needed climbing 3-5 steps with a railing? : A Lot 6 Click Score: 16    End of Session Equipment Utilized During Treatment: Gait belt Activity Tolerance: Patient tolerated treatment well Patient left: in bed;with call bell/phone within reach;with bed alarm set;with family/visitor present Nurse Communication: Mobility status PT Visit Diagnosis: Unsteadiness on feet (R26.81);Other abnormalities of gait and mobility (R26.89);Difficulty in walking, not elsewhere classified (R26.2)    Time: 0263-7858 PT Time Calculation (min) (ACUTE ONLY): 23 min   Charges:   PT Evaluation $PT Eval Low Complexity: 1 Low PT Treatments $Gait Training: 8-22 mins   PT G Codes:   PT G-Codes **NOT FOR INPATIENT CLASS** Functional Assessment Tool Used: AM-PAC 6 Clicks Basic Mobility;Clinical judgement Functional Limitation: Mobility: Walking and moving around Mobility: Walking and Moving Around Current Status (I5027): At least 40 percent but less than 60 percent impaired, limited or restricted Mobility: Walking and Moving Around Goal Status [phone]): At least 1 percent but less than 20 percent impaired, limited or restricted    Leighton Ruff, PT, DPT  Acute Rehabilitation Services  Pager: [place] 09/20/2017, 3:07 PM

## 2017-09-20 NOTE — Care Management Note (Signed)
Case Management Note  Patient Details  Name: Derrick [name] MRN: [id number] Date of Birth: [birth year]  Subjective/Objective:           Spoke w patient and wife at bedside. They will resume HH through [facility] and IV Infusions through [facility]. No HH orders needed, patient in obs. Both [employer] companies notified to expect DC today as noted by MD in progression rounds this AM.      No other CM needs identified.   Action/Plan:   Expected Discharge Date:                  Expected Discharge Plan:  [place]  In-House Referral:     Discharge planning Services  CM Consult  Post Acute Care Choice:  Home Health Choice offered to:  Patient  DME Arranged:  IV pump/equipment DME Agency:  [facility] Arranged:  RN, PT [facility] Agency:  [facility]  Status of Service:  Completed, signed off  If discussed at [place] of Stay Meetings, dates discussed:    Additional Comments:  Carles Collet, RN 09/20/2017, 11:52 AM

## 2017-09-20 NOTE — Discharge Summary (Signed)
Physician Discharge Summary  Derrick [name] EHU:[id number] DOB: 12/06/1931 DOA: 09/19/2017  PCP: [name], MD  Admit date: 09/19/2017 Discharge date: 09/20/2017  Admitted From: home Disposition:  home  Recommendations for Outpatient Follow-up:  1. Follow up with PCP in 1-2 weeks 2. Follow up with [facility] RN as scheduled 3. Follow up with podiatry as scheduled 4. On prolonged antibiotics from prior hospitalization, continue  Home Health: RN Equipment/Devices: none  Discharge Condition: stable CODE STATUS: Full code Diet recommendation: heart healthy  HPI: Per admitting team, Derrick [name]  is a 83 y.o. male, with hx of COPD, mild CKD, HL, HTN, prostate cancer, hx RCCa sp nephrectomy who underwent Rt TMA amputation of the right foot on 09/13/2017 by Dr. [name], discharged on 09/17/2017 on IV Rocephin through 10/22/2016.  Now presents with dizziness fatigue. In ED... Patient was found to be very orthostatic and had near syncopal episode, Systolic blood pressure was in the 70s-80s, his creatinine had bumped to 1.44 from his recent baseline of 1.1 Patient states he is compliant with losartan HCTZ, amlodipine and Elavil all of which is probably contributing to his orthostatic hypotension Additional history obtained from patient wife and son at bedside, no chest pains, shortness of breath no pleuritic symptoms No vomiting or diarrhea but does admit to poor oral intake In ED... Despite IV fluids patient remained dizzy and orthostatic     Hospital Course: Discharge Diagnoses:  Principal Problem:   Syncope and collapse Active Problems:   Hypertension   Hypotension   Volume depletion   AKI (acute kidney injury) ([place])   Syncope-suspect secondary to volume depletion due to decreased oral intake  in the setting of antihypertensive use, he received IVF, improved clinically, his BP improved and returned to baseline. He was ambulating in the hallway without difficulties and was discharged home in  stable condition off of his Losartan / HCTZ. 2D echo with normal EF AKI-creatinine is up to 1.44, recent baseline 1.1, due to secondary to reduced oral intake and losartan/HCTZ use. Cr returned to baseline on discharge  S/p Rt TMA of right foot-due to osteomyelitis/peripheral artery disease/nonhealing ulcers/wounds-continue IV Rocephin 2 g daily through 10/22/2017 as previously prescribed BPH -continue Proscar PAD-status post prior revascularization, status post amputation, continue aspirin, Plavix    Discharge Instructions   Allergies as of 09/20/2017      Reactions   Ace Inhibitors Other (See Comments)   Reaction:  Unknown       Medication List    STOP taking these medications   losartan-hydrochlorothiazide 100-12.5 MG tablet Commonly known as:  HYZAAR     TAKE these medications   amitriptyline 25 MG tablet Commonly known as:  ELAVIL Take 25 mg by mouth daily.   amLODipine 10 MG tablet Commonly known as:  NORVASC Take 10 mg by mouth daily.   aspirin EC 81 MG tablet Take 81 mg by mouth at bedtime.   cefTRIAXone IVPB Commonly known as:  ROCEPHIN Inject 2 g into the vein daily. Indication:  R-foot osteomyelitis Last Day of Therapy:  10/22/16 Labs - Once weekly:  CBC/D and BMP, Labs - Every other week:  ESR and CRP What changed:  additional instructions   clopidogrel 75 MG tablet Commonly known as:  PLAVIX Take 75 mg by mouth daily.   finasteride 5 MG tablet Commonly known as:  PROSCAR Take 5 mg by mouth daily.   Fish Oil 1200 MG Caps Take 2,400 mg by mouth daily.   HYDROmorphone 2 MG tablet Commonly  known as:  DILAUDID Take 1 tablet (2 mg total) by mouth every 4 (four) hours as needed for severe pain.   hydroxychloroquine 200 MG tablet Commonly known as:  PLAQUENIL Take 200 mg by mouth daily.   multivitamin with minerals Tabs tablet Take 1 tablet by mouth daily.   ondansetron 4 MG tablet Commonly known as:  ZOFRAN Take 4 mg by mouth every 8 (eight)  hours as needed for nausea or vomiting.   pantoprazole 40 MG tablet Commonly known as:  PROTONIX Take 40 mg by mouth daily.   predniSONE 5 MG tablet Commonly known as:  DELTASONE Take 5 mg by mouth daily with breakfast.   vitamin B-12 1000 MCG tablet Commonly known as:  CYANOCOBALAMIN Take 1,000 mcg by mouth daily.      Follow-up Information    [name], MD. Schedule an appointment as soon as possible for a visit in 3 weeks.   Specialty:  Internal Medicine Contact information: [address] [place] [address] [phone]           Consultations:  None   Procedures/Studies:  2D echo  Study Conclusions - Left ventricle: The cavity size was normal. Wall thickness was normal. Systolic function was normal. The estimated ejection fraction was in the range of 55% to 60%. Wall motion was normal; there were no regional wall motion abnormalities. Doppler parameters are consistent with abnormal left ventricular relaxation (grade 1 diastolic dysfunction). - Aortic valve: Trileaflet; mildly thickened, mildly calcified leaflets. - Mitral valve: There was mild regurgitation.  Dg Chest 2 View  Result Date: 09/19/2017 CLINICAL DATA:  Hypotension at home. Dizziness and lightheadedness. Recent foot surgery for infection. EXAM: CHEST  2 VIEW COMPARISON:  09/09/2017 FINDINGS: Right arm PICC tip is in the SVC 3 cm above the right atrium. Heart size is normal. Aortic atherosclerosis. The pulmonary vascularity is normal. Lungs are clear. No effusions. No acute bone finding. IMPRESSION: No active disease. Aortic atherosclerosis. Right arm PICC tip SVC 3 cm above the right atrium. Electronically Signed   By: Nelson Chimes M.D.   On: 09/19/2017 13:47   Dg Chest Portable 1 View  Result Date: 09/09/2017 CLINICAL DATA:  83 year old male with a history of low blood pressure EXAM: PORTABLE CHEST 1 VIEW COMPARISON:  06/12/2017 FINDINGS: Cardiomediastinal silhouette unchanged.  No evidence of central vascular congestion. No pneumothorax or pleural effusion. No confluent airspace disease. No acute displaced fracture.  Degenerative changes of the shoulders. IMPRESSION: Low lung volumes and chronic changes with no evidence of superimposed acute cardiopulmonary disease Electronically Signed   By: Corrie Mckusick D.O.   On: 09/09/2017 15:25   Dg Foot Complete Right  Result Date: 09/19/2017 CLINICAL DATA:  Hypotension.  Recent surgery for infection. EXAM: RIGHT FOOT COMPLETE - 3+ VIEW COMPARISON:  09/13/2017 FINDINGS: Complete forefoot amputation at the level of the proximal metatarsals. Surgical margins appear sharp. No evidence of bone destruction in the residual tissue. Diminished swelling and air in the soft tissues. IMPRESSION: No unexpected finding. Forefoot amputation. Diminishing swelling and air in the soft tissues. Electronically Signed   By: Nelson Chimes M.D.   On: 09/19/2017 13:48   Dg Foot Complete Right  Result Date: 09/13/2017 CLINICAL DATA:  Status post transmetatarsal amputation today. EXAM: RIGHT FOOT COMPLETE - 3+ VIEW COMPARISON:  Plain films of the right foot 09/11/2017. FINDINGS: Previously seen and transmetatarsal amputation has been extended. No acute abnormality is identified. Surgical staples are in place. Surgical drain is noted. IMPRESSION: Extension  of prior transmetatarsal amputation.  No acute abnormality Electronically Signed   By: Inge Rise M.D.   On: 09/13/2017 20:36   Dg Foot Complete Right  Result Date: 09/11/2017 CLINICAL DATA:  Right foot pain and swelling. EXAM: RIGHT FOOT COMPLETE - 3+ VIEW COMPARISON:  Radiographs of September 02, 2017. FINDINGS: Vascular calcifications are noted. Status post surgical amputation of all 5 distal metatarsals and phalanges. There appears to be a large ulceration overlying distal portion of residual fifth metatarsal with possible lytic defects seen in the distal portion of residual fifth metatarsal. This may  represent osteomyelitis. IMPRESSION: Status post surgical amputation of all 5 distal metatarsals and phalanges. Probable large soft tissue ulceration is seen overlying distal portion of residual fifth metatarsal with possible lytic destruction suggesting osteomyelitis. MRI may be performed for further evaluation. Electronically Signed   By: Marijo Conception, M.D.   On: 09/11/2017 10:44   Dg Foot Complete Right  Result Date: 09/04/2017 Please see detailed radiograph report in office note.     Subjective: - no chest pain, shortness of breath, no abdominal pain, nausea or vomiting.   Discharge Exam: Vitals:   09/20/17 0047 09/20/17 0427  BP: 115/66 (!) 151/76  Pulse: 85 91  Resp: 18 18  Temp: 98.3 F (36.8 C) 98.1 F (36.7 C)  SpO2: 96% 100%    General: Pt is alert, awake, not in acute distress Cardiovascular: RRR, S1/S2 +, no rubs, no gallops Respiratory: CTA bilaterally, no wheezing, no rhonchi Abdominal: Soft, NT, ND, bowel sounds + Extremities: no edema, no cyanosis    The results of significant diagnostics from this hospitalization (including imaging, microbiology, ancillary and laboratory) are listed below for reference.     Microbiology: Recent Results (from the past 240 hour(s))  Fungus Culture With Stain     Status: None (Preliminary result)   Collection Time: 09/13/17  5:30 PM  Result Value Ref Range Status   Fungus Stain Final report  Final    Comment: (NOTE) Performed At: [facility] [place], [place] [id number] Rush Farmer MD [id number]    Fungus (Mycology) Culture PENDING  Incomplete   Fungal Source WOUND  Final    Comment: DEEP SOFT TISSUE CENTRAL  Aerobic/Anaerobic Culture (surgical/deep wound)     Status: None   Collection Time: 09/13/17  5:30 PM  Result Value Ref Range Status   Specimen Description WOUND  Final   Special Requests DEEP SOFT TISSUE CENTRAL  Final   Gram Stain NO WBC SEEN NO ORGANISMS SEEN   Final    Culture No growth aerobically or anaerobically.  Final   Report Status 09/19/2017 FINAL  Final  Acid Fast Smear (AFB)     Status: None   Collection Time: 09/13/17  5:30 PM  Result Value Ref Range Status   AFB Specimen Processing Comment  Final    Comment: Tissue Grinding and Digestion/Decontamination   Acid Fast Smear Negative  Final    Comment: (NOTE) Performed At: [facility] [place], [place] [id number] Rush Farmer MD YK:[id number]    Source (AFB) WOUND  Final    Comment: DEEP SOFT TISSUE CENTRAL  Fungus Culture Result     Status: None   Collection Time: 09/13/17  5:30 PM  Result Value Ref Range Status   Result 1 Comment  Final    Comment: (NOTE) KOH/Calcofluor preparation:  no fungus observed. Performed At: [facility] [place], [place] [id number] Rush Farmer MD [id number]  Fungus Culture With Stain     Status: None (Preliminary result)   Collection Time: 09/13/17  5:31 PM  Result Value Ref Range Status   Fungus Stain Final report  Final    Comment: (NOTE) Performed At: [facility] [place], [place] [id number] Rush Farmer MD CE:[id number]    Fungus (Mycology) Culture PENDING  Incomplete   Fungal Source WOUND  Final    Comment: DEEP SOFT TISSUE INTERNAL  Aerobic/Anaerobic Culture (surgical/deep wound)     Status: None   Collection Time: 09/13/17  5:31 PM  Result Value Ref Range Status   Specimen Description WOUND  Final   Special Requests DEEP SOFT TISSUE INTERNAL  Final   Gram Stain   Final    RARE WBC PRESENT,BOTH PMN AND MONONUCLEAR NO ORGANISMS SEEN    Culture   Final    FEW ENTEROBACTER CLOACAE CRITICAL RESULT CALLED TO, READ BACK BY AND VERIFIED WITH: A MABE,RN AT 1705 09/14/17 BY L BENFIELD CONCERNING GROWTH ON CULTURE FEW STAPHYLOCOCCUS AUREUS NO ANAEROBES ISOLATED    Report Status 09/19/2017 FINAL  Final   Organism ID, Bacteria ENTEROBACTER CLOACAE  Final   Organism ID,  Bacteria STAPHYLOCOCCUS AUREUS  Final      Susceptibility   Enterobacter cloacae - MIC*    CEFAZOLIN >=64 RESISTANT Resistant     CEFEPIME <=1 SENSITIVE Sensitive     CEFTAZIDIME <=1 SENSITIVE Sensitive     CEFTRIAXONE <=1 SENSITIVE Sensitive     CIPROFLOXACIN <=0.25 SENSITIVE Sensitive     GENTAMICIN <=1 SENSITIVE Sensitive     IMIPENEM <=0.25 SENSITIVE Sensitive     TRIMETH/SULFA <=20 SENSITIVE Sensitive     PIP/TAZO <=4 SENSITIVE Sensitive     * FEW ENTEROBACTER CLOACAE   Staphylococcus aureus - MIC*    CIPROFLOXACIN 2 INTERMEDIATE Intermediate     ERYTHROMYCIN <=0.25 SENSITIVE Sensitive     GENTAMICIN <=0.5 SENSITIVE Sensitive     OXACILLIN 0.5 SENSITIVE Sensitive     TETRACYCLINE <=1 SENSITIVE Sensitive     VANCOMYCIN <=0.5 SENSITIVE Sensitive     TRIMETH/SULFA <=10 SENSITIVE Sensitive     CLINDAMYCIN <=0.25 SENSITIVE Sensitive     RIFAMPIN <=0.5 SENSITIVE Sensitive     Inducible Clindamycin NEGATIVE Sensitive     * FEW STAPHYLOCOCCUS AUREUS  Acid Fast Smear (AFB)     Status: None   Collection Time: 09/13/17  5:31 PM  Result Value Ref Range Status   AFB Specimen Processing Concentration  Final   Acid Fast Smear Negative  Final    Comment: (NOTE) Performed At: [facility] [place], [place] [id number] Rush Farmer MD [id number]    Source (AFB) WOUND  Final    Comment: DEEP SOFT TISSUE INTERNAL  Fungus Culture Result     Status: None   Collection Time: 09/13/17  5:31 PM  Result Value Ref Range Status   Result 1 Comment  Final    Comment: (NOTE) KOH/Calcofluor preparation:  no fungus observed. Performed At: [facility] [place], [place] [id number] Rush Farmer MD YH:[id number]   Fungus Culture With Stain     Status: None (Preliminary result)   Collection Time: 09/13/17  5:32 PM  Result Value Ref Range Status   Fungus Stain Final report  Final    Comment: (NOTE) Performed At: [facility] [place], [place] [id number] Rush Farmer MD PP:[id number]    Fungus (Mycology) Culture PENDING  Incomplete   Fungal Source WOUND  Final  Comment: FIFTH METATARSAL BONE  Aerobic/Anaerobic Culture (surgical/deep wound)     Status: None   Collection Time: 09/13/17  5:32 PM  Result Value Ref Range Status   Specimen Description WOUND  Final   Special Requests FIFTH METATARSAL BONE  Final   Gram Stain   Final    RARE WBC PRESENT, PREDOMINANTLY MONONUCLEAR NO ORGANISMS SEEN    Culture   Final    RARE ENTEROBACTER CLOACAE SUSCEPTIBILITIES PERFORMED ON PREVIOUS CULTURE WITHIN THE LAST 5 DAYS. NO ANAEROBES ISOLATED CRITICAL RESULT CALLED TO, READ BACK BY AND VERIFIED WITH: A MABE,RN AT 1705 09/14/17 BY L BENFIELD CONCERNING GROWTH ON CULTURE    Report Status 09/19/2017 FINAL  Final  Acid Fast Smear (AFB)     Status: None   Collection Time: 09/13/17  5:32 PM  Result Value Ref Range Status   AFB Specimen Processing Comment  Final    Comment: Tissue Grinding and Digestion/Decontamination   Acid Fast Smear Negative  Final    Comment: (NOTE) Performed At: [facility] [place], [place] [id number] Rush Farmer MD [id number]    Source (AFB) WOUND  Final    Comment: FIFTH METATARSAL BONE  Fungus Culture Result     Status: None   Collection Time: 09/13/17  5:32 PM  Result Value Ref Range Status   Result 1 Comment  Final    Comment: (NOTE) KOH/Calcofluor preparation:  no fungus observed. Performed At: [facility] [place], [place] [id number] Rush Farmer MD QI:[id number]   Fungus Culture With Stain     Status: None (Preliminary result)   Collection Time: 09/13/17  5:33 PM  Result Value Ref Range Status   Fungus Stain Final report  Final    Comment: (NOTE) Performed At: [facility] [place], [place] [id number] Rush Farmer MD [id number]    Fungus (Mycology) Culture PENDING  Incomplete   Fungal  Source WOUND  Final    Comment: THIRD METATARSAL BONE  Aerobic/Anaerobic Culture (surgical/deep wound)     Status: None   Collection Time: 09/13/17  5:33 PM  Result Value Ref Range Status   Specimen Description WOUND  Final   Special Requests THIRD METATARSAL BONE  Final   Gram Stain   Final    WBC PRESENT, PREDOMINANTLY PMN NO ORGANISMS SEEN    Culture   Final    RARE STAPHYLOCOCCUS AUREUS RARE ENTEROBACTER CLOACAE GROWTH ON CULTURE. CRITICAL VALUE NOTED.  VALUE IS CONSISTENT WITH PREVIOUSLY REPORTED AND CALLED VALUE. NO ANAEROBES ISOLATED    Report Status 09/19/2017 FINAL  Final  Acid Fast Smear (AFB)     Status: None   Collection Time: 09/13/17  5:33 PM  Result Value Ref Range Status   AFB Specimen Processing Comment  Final    Comment: Tissue Grinding and Digestion/Decontamination   Acid Fast Smear Negative  Final    Comment: (NOTE) Performed At: [facility] [place], [place] [id number] Rush Farmer MD [id number]    Source (AFB) WOUND  Final    Comment: THIRD METATARSAL BONE  Fungus Culture Result     Status: None   Collection Time: 09/13/17  5:33 PM  Result Value Ref Range Status   Result 1 Comment  Final    Comment: (NOTE) KOH/Calcofluor preparation:  no fungus observed. Performed At: [facility] [place], [place] [id number] Rush Farmer MD [id number]   Fungus Culture With Stain     Status: None (Preliminary result)  Collection Time: 09/13/17  5:41 PM  Result Value Ref Range Status   Fungus Stain Final report  Final    Comment: (NOTE) Performed At: [facility] [place], [place] [id number] Rush Farmer MD [id number]    Fungus (Mycology) Culture PENDING  Incomplete   Fungal Source WOUND  Final    Comment: FIRST METATARSAL BONE  Aerobic/Anaerobic Culture (surgical/deep wound)     Status: None   Collection Time: 09/13/17  5:41 PM  Result Value Ref Range Status   Specimen  Description WOUND  Final   Special Requests FIRST METATARSAL BONE  Final   Gram Stain   Final    RARE WBC PRESENT, PREDOMINANTLY PMN NO ORGANISMS SEEN    Culture   Final    RARE DIPHTHEROIDS(CORYNEBACTERIUM SPECIES) Standardized susceptibility testing for this organism is not available. NO ANAEROBES ISOLATED GROWTH ON CULTURE. CRITICAL VALUE NOTED.  VALUE IS CONSISTENT WITH PREVIOUSLY REPORTED AND CALLED VALUE.    Report Status 09/19/2017 FINAL  Final  Acid Fast Smear (AFB)     Status: None   Collection Time: 09/13/17  5:41 PM  Result Value Ref Range Status   AFB Specimen Processing Concentration  Final   Acid Fast Smear Negative  Final    Comment: (NOTE) Performed At: [facility] [place], [place] [id number] Rush Farmer MD WU:[id number]    Source (AFB) WOUND  Final    Comment: FIRST METATARSAL BONE  Fungus Culture Result     Status: None   Collection Time: 09/13/17  5:41 PM  Result Value Ref Range Status   Result 1 Comment  Final    Comment: (NOTE) KOH/Calcofluor preparation:  no fungus observed. Performed At: [facility] [place], [place] [id number] Rush Farmer MD [id number]   Blood culture (routine x 2)     Status: None (Preliminary result)   Collection Time: 09/19/17 12:40 PM  Result Value Ref Range Status   Specimen Description BLOOD LEFT ANTECUBITAL  Final   Special Requests   Final    BOTTLES DRAWN AEROBIC AND ANAEROBIC Blood Culture adequate volume   Culture NO GROWTH < 24 HOURS  Final   Report Status PENDING  Incomplete  Blood culture (routine x 2)     Status: None (Preliminary result)   Collection Time: 09/19/17 12:50 PM  Result Value Ref Range Status   Specimen Description BLOOD LEFT HAND  Final   Special Requests   Final    BOTTLES DRAWN AEROBIC AND ANAEROBIC Blood Culture results may not be optimal due to an excessive volume of blood received in culture bottles   Culture NO GROWTH < 24 HOURS  Final    Report Status PENDING  Incomplete  Urine culture     Status: None   Collection Time: 09/19/17  2:22 PM  Result Value Ref Range Status   Specimen Description URINE, RANDOM  Final   Special Requests NONE  Final   Culture NO GROWTH  Final   Report Status 09/20/2017 FINAL  Final     Labs: BNP (last 3 results) Recent Labs    12/05/16 1300  BNP 48.0   Basic Metabolic Panel: Recent Labs  Lab 09/15/17 0404 09/16/17 0442 09/17/17 0552 09/19/17 1157 09/20/17 0455  NA 136 136 136 135 136  K 3.2* 3.6 3.4* 3.7 3.9  CL 104 104 104 103 108  CO2 _0 18*  GLUCOSE 100* 91 87 100* 83  BUN _1 11  CREATININE 1.26* 1.15 1.19 1.44* 1.13  CALCIUM 8.4* 8.7* 9.0 9.5 8.6*  MG  --  1.7  --   --   --    Liver Function Tests: Recent Labs  Lab 09/19/17 1157  AST 37  ALT 45  ALKPHOS 48  BILITOT 0.4  PROT 6.7  ALBUMIN 3.2*   No results for input(s): LIPASE, AMYLASE in the last 168 hours. No results for input(s): AMMONIA in the last 168 hours. CBC: Recent Labs  Lab 09/15/17 0404 09/16/17 0442 09/17/17 0552 09/19/17 1157 09/20/17 0455  WBC 8.9 7.4 5.8 8.4 5.2  NEUTROABS  --   --   --  4.4  --   HGB 8.6* 9.0* 9.4* 10.0* 8.6*  HCT 26.2* 27.4* 28.8* 31.0* 26.5*  MCV 86.5 88.1 88.1 88.1 88.6  PLT 181 185 188 240 181   Cardiac Enzymes: No results for input(s): CKTOTAL, CKMB, CKMBINDEX, TROPONINI in the last 168 hours. BNP: Invalid input(s): POCBNP CBG: No results for input(s): GLUCAP in the last 168 hours. D-Dimer No results for input(s): DDIMER in the last 72 hours. Hgb A1c No results for input(s): HGBA1C in the last 72 hours. Lipid Profile No results for input(s): CHOL, HDL, LDLCALC, TRIG, CHOLHDL, LDLDIRECT in the last 72 hours. Thyroid function studies No results for input(s): TSH, T4TOTAL, T3FREE, THYROIDAB in the last 72 hours.  Invalid input(s): FREET3 Anemia work up No results for input(s): VITAMINB12, FOLATE, FERRITIN, TIBC, IRON, RETICCTPCT in the  last 72 hours. Urinalysis    Component Value Date/Time   COLORURINE YELLOW 09/19/2017 [place] 09/19/2017 1422   LABSPEC 1.012 09/19/2017 1422   PHURINE 7.0 09/19/2017 1422   GLUCOSEU NEGATIVE 09/19/2017 1422   HGBUR NEGATIVE 09/19/2017 1422   BILIRUBINUR NEGATIVE 09/19/2017 1422   KETONESUR NEGATIVE 09/19/2017 1422   PROTEINUR NEGATIVE 09/19/2017 1422   UROBILINOGEN 1.0 11/27/2013 0947   NITRITE NEGATIVE 09/19/2017 1422   LEUKOCYTESUR NEGATIVE 09/19/2017 1422   Sepsis Labs Invalid input(s): PROCALCITONIN,  WBC,  LACTICIDVEN   Time coordinating discharge: 35 minutes  SIGNED:  Marzetta Board, MD  [facility] 09/20/2017, 4:16 PM Pager [phone]  If 7PM-7AM, please contact night-coverage [email or web address] Password [email or web address]

## 2017-09-23 DIAGNOSIS — J449 Chronic obstructive pulmonary disease, unspecified: Secondary | ICD-10-CM | POA: Diagnosis not present

## 2017-09-23 DIAGNOSIS — I2510 Atherosclerotic heart disease of native coronary artery without angina pectoris: Secondary | ICD-10-CM | POA: Diagnosis not present

## 2017-09-23 DIAGNOSIS — Z89421 Acquired absence of other right toe(s): Secondary | ICD-10-CM | POA: Diagnosis not present

## 2017-09-23 DIAGNOSIS — N183 Chronic kidney disease, stage 3 (moderate): Secondary | ICD-10-CM | POA: Diagnosis not present

## 2017-09-23 DIAGNOSIS — K219 Gastro-esophageal reflux disease without esophagitis: Secondary | ICD-10-CM | POA: Diagnosis not present

## 2017-09-23 DIAGNOSIS — N400 Enlarged prostate without lower urinary tract symptoms: Secondary | ICD-10-CM | POA: Diagnosis not present

## 2017-09-23 DIAGNOSIS — I129 Hypertensive chronic kidney disease with stage 1 through stage 4 chronic kidney disease, or unspecified chronic kidney disease: Secondary | ICD-10-CM | POA: Diagnosis not present

## 2017-09-23 DIAGNOSIS — Z89411 Acquired absence of right great toe: Secondary | ICD-10-CM | POA: Diagnosis not present

## 2017-09-23 DIAGNOSIS — D631 Anemia in chronic kidney disease: Secondary | ICD-10-CM | POA: Diagnosis not present

## 2017-09-23 DIAGNOSIS — I739 Peripheral vascular disease, unspecified: Secondary | ICD-10-CM | POA: Insufficient documentation

## 2017-09-23 NOTE — Progress Notes (Signed)
Patient ID: Derrick [name], male   DOB: 02-22-1932, 83 y.o.   MRN: [id number] Patient ID: Derrick [name], male   DOB: 07-Nov-1931, 83 y.o.   MRN: [id number]     Cardiology Office Note    Date:  09/30/2017   ID:  Derrick [name], DOB 07-Aug-1932, MRN [id number]  PCP:  [name], MD  Cardiologist:   Sanda Klein, MD   Chief Complaint  Patient presents with  . Follow-up    surgical procedure  . Chest Pain    History of Present Illness:  Derrick [name] is a 83 y.o. male hypertension and hypercholesterolemia and moderate chronic renal insufficiency status post nephrectomy for renal cell carcinoma who returns in follow-up for edema and dyspnea.  He did not tolerate diuretics for diastolic dysfunction due to hypotension. We tried again after stopping amlodipine. He has had a couple of syncopal events as described below. More recently he has not lost consciousness, but has substantial lightheadedness when he tries to stand up and has to need to sit back down again.  He has had a transmetatarsal amputation of the R foot 08/14/2017. He was admitted with hypotension, AKI and syncope 09/09/2017 after nausea and poor PO intake. IV antibiotics were given for R foot stump osteomyelitis (Enterobacter and MSSA  +ve) and the wound was surgically debrided. Losartan HCTZ was stopped and he was given amlodipine. Discharged on 12/11 he returned on 12/13 with fatigue and a non-productive cough, again hypotensive (apparently was taking the losartan-HCTZ again). He improved with IV fluids, but was admitted due to orthostatic hypotension.  History hemoglobin was 8.6 on December 14, up and down around that value over the last month. This was down from 12.4 in September. The anemia is normocytic and the white blood cell count and platelet count are normal.  He has an abnormal electrocardiogram and nonobstructive coronary artery disease (angiography 2013 showed 40-50% mid RCA, 40% proximal LAD). Echo in May 2017 showed left  ventricular ejection fraction of 60-65% with left ventricular hypertrophy and diastolic dysfunction. There were borderline findings for fluid overload. We performed a nuclear stress test in June 2017 that did not show any perfusion abnormalities and confirmed normal left ventricle systolic function.   His memory is deteriorating. He stopped smoking about 35 years ago. It is unclear whether he has COPD.   Past Medical History:  Diagnosis Date  . Anemia   . Anxiety   . Arthritis, rheumatoid ([name])   . BPH (benign prostatic hyperplasia)   . CAD (coronary artery disease)   . CKD (chronic kidney disease)   . COPD (chronic obstructive pulmonary disease) ([name])   . Depression   . Diverticulosis   . Elevated PSA   . Gangrene ([name])    of toe right foot  . GERD (gastroesophageal reflux disease)   . Herpes simplex labialis   . Hiatal hernia   . High cholesterol   . History of colonic polyps   . Hypertension   . Insomnia   . Lacunar infarction   . Memory loss   . Obesity   . Panic attacks   . Prostate cancer ([name])   . Renal cell cancer ([name])    s/p nephrectomy in 2000  . Seropositive rheumatoid arthritis ([place])   . Spermatocele   . Tinnitus   . Tremor of both hands     Past Surgical History:  Procedure Laterality Date  . AMPUTATION TOE Right 06/13/2017   Procedure: AMPUTATION TOE 1st and 2nd;  Surgeon: [name], DPM;  Location: [facility];  Service: Podiatry;  Laterality: Right;  . AMPUTATION TOE    . BACK SURGERY    . COLONOSCOPY    . I&D EXTREMITY Right 09/13/2017   Procedure: IRRIGATION AND DEBRIDEMENT AND BONE BIOPSY;  Surgeon: [name], DPM;  Location: [facility];  Service: Podiatry;  Laterality: Right;  . IR ANGIOGRAM EXTREMITY RIGHT  05/30/2017  . IR ANGIOGRAM EXTREMITY RIGHT  08/08/2017  . IR ANGIOGRAM SELECTIVE EACH ADDITIONAL VESSEL  08/08/2017  . IR ANGIOGRAM SELECTIVE EACH ADDITIONAL VESSEL  08/08/2017  . IR RADIOLOGIST EVAL & MGMT  05/16/2017  . IR TIB-PERO ART PTA MOD  SED  05/30/2017  . IR TIB-PERO ART PTA MOD SED  08/08/2017  . IR TIB-PERO ART UNI PTA EA ADD VESSEL MOD SED  05/30/2017  . IR TIB-PERO ART UNI PTA EA ADD VESSEL MOD SED  08/08/2017  . IR US GUIDE VASC ACCESS RIGHT  05/30/2017  . IR US GUIDE VASC ACCESS RIGHT  08/08/2017  . LEFT HEART CATHETERIZATION WITH CORONARY ANGIOGRAM N/A 11/19/2011   Procedure: LEFT HEART CATHETERIZATION WITH CORONARY ANGIOGRAM;  Surgeon: [name], MD;  Location: [facility] CATH LAB;  Service: Cardiovascular;  Laterality: N/A;  . NEPHRECTOMY  10/1998  . TRANSMETATARSAL AMPUTATION Right 08/14/2017   Procedure: TRANSMETATARSAL AMPUTATION;  Surgeon: [name], DPM;  Location: [facility];  Service: Podiatry;  Laterality: Right;    Outpatient Medications Prior to Visit  Medication Sig Dispense Refill  . amitriptyline (ELAVIL) 25 MG tablet Take 25 mg by mouth daily.    Marland Kitchen aspirin EC 81 MG tablet Take 81 mg by mouth at bedtime.     . clopidogrel (PLAVIX) 75 MG tablet Take 75 mg by mouth daily.    Marland Kitchen HYDROmorphone (DILAUDID) 2 MG tablet Take 1 tablet (2 mg total) by mouth every 4 (four) hours as needed for severe pain. 30 tablet 0  . Multiple Vitamin (MULTIVITAMIN WITH MINERALS) TABS tablet Take 1 tablet by mouth daily.    . Omega-3 Fatty Acids (FISH OIL) 1200 MG CAPS Take 2,400 mg by mouth daily.     . pantoprazole (PROTONIX) 40 MG tablet Take 40 mg by mouth daily.     . predniSONE (DELTASONE) 5 MG tablet Take 5 mg by mouth daily with breakfast.     . vitamin B-12 (CYANOCOBALAMIN) 1000 MCG tablet Take 1,000 mcg by mouth daily.    Marland Kitchen amLODipine (NORVASC) 10 MG tablet Take 10 mg by mouth daily.    . cefTRIAXone (ROCEPHIN) IVPB Inject 2 g into the vein daily. Indication:  R-foot osteomyelitis Last Day of Therapy:  10/22/16 Labs - Once weekly:  CBC/D and BMP, Labs - Every other week:  ESR and CRP (Patient taking differently: Inject 2 g into the vein daily. Indication:  R-foot osteomyelitis Last Day of Therapy:  10/22/16 Labs - Once  weekly:  CBC/D and BMP, Labs - Every other week:  ESR and CRP  Pt started 09-19-17- therapy for 5 weeks) 37 Units 0  . finasteride (PROSCAR) 5 MG tablet Take 5 mg by mouth daily.    . hydroxychloroquine (PLAQUENIL) 200 MG tablet Take 200 mg by mouth daily.    . ondansetron (ZOFRAN) 4 MG tablet Take 4 mg by mouth every 8 (eight) hours as needed for nausea or vomiting.    . sulfamethoxazole-trimethoprim (BACTRIM) 400-80 MG tablet Take 1 tablet by mouth 2 (two) times daily. 20 tablet 0   No facility-administered medications prior  to visit.      Allergies:   Ace inhibitors   Social History   Socioeconomic History  . Marital status: Married    Spouse name: None  . Number of children: None  . Years of education: None  . Highest education level: None  Social Needs  . Financial resource strain: None  . Food insecurity - worry: None  . Food insecurity - inability: None  . Transportation needs - medical: None  . Transportation needs - non-medical: None  Occupational History  . None  Tobacco Use  . Smoking status: Former Smoker    Packs/day: 1.00    Years: 29.00    Pack years: 29.00    Types: Cigarettes    Last attempt to quit: 10/08/1978    Years since quitting: 39.0  . Smokeless tobacco: Never Used  . Tobacco comment: tried smokeless tobacco once but made him sick  Substance and Sexual Activity  . Alcohol use: No    Comment: drank very little in his life - only socially   . Drug use: No  . Sexual activity: None  Other Topics Concern  . None  Social History Narrative   Retired   Married   4 children           Family History:  The patient's family history includes Breast cancer (age of onset: 27) in his daughter; COPD in his son; Cancer in his paternal aunt and sister; Heart attack (age of onset: 14) in his son; Heart attack (age of onset: 18) in his father; Kidney disease in his sister; Kidney failure in his sister; Leukemia (age of onset: 67) in his son; Stroke in his  father.   ROS:   Please see the history of present illness.    ROS All other systems reviewed and are negative.   PHYSICAL EXAM:   VS:  BP 113/81   Pulse 93   Ht 6' (1.829 m)   Wt 165 lb (74.8 kg)   BMI 22.38 kg/m     General: Alert, oriented x3, no acute distress, pale, weak Head: no evidence of trauma, PERRL, EOMI, no exophtalmos or lid lag, no myxedema, no xanthelasma; normal ears, nose and oropharynx Neck: normal jugular venous pulsations and no hepatojugular reflux; brisk carotid pulses without delay and no carotid bruits Chest: clear to auscultation, no signs of consolidation by percussion or palpation, normal fremitus, symmetrical and full respiratory excursions Cardiovascular: normal position and quality of the apical impulse, normal first and second heart sounds, irregular rhythm ; grade 1/6 systolic ejection murmur in the aortic focus, no diastolic murmurs, rubs or gallops Abdomen: no tenderness or distention, no masses by palpation, no abnormal pulsatility or arterial bruits, normal bowel sounds, no hepatosplenomegaly Extremities: no clubbing, cyanosis or edema; 2+ radial, ulnar and brachial pulses bilaterally; 2+ right femoral, posterior tibial and dorsalis pedis pulses; 2+ left femoral, posterior tibial and dorsalis pedis pulses; no subclavian or femoral bruits Neurological: grossly nonfocal Psych: Normal mood and affect   Wt Readings from Last 3 Encounters:  09/30/17 165 lb (74.8 kg)  09/20/17 169 lb 14.4 oz (77.1 kg)  09/17/17 149 lb 7.6 oz (67.8 kg)      Studies/Labs Reviewed:   ECHO 09/20/2017:  - Left ventricle: The cavity size was normal. Wall thickness was   normal. Systolic function was normal. The estimated ejection   fraction was in the range of 55% to 60%. Wall motion was normal;   there were no regional wall motion abnormalities. Doppler  parameters are consistent with abnormal left ventricular   relaxation (grade 1 diastolic dysfunction). -  Aortic valve: Trileaflet; mildly thickened, mildly calcified   leaflets. - Mitral valve: There was mild regurgitation.  EKG:  EKG is ordered today.  Shows sinus rhythm with a couple of PACs,  relatively early R/S transition in V2, minor nonspecific T-wave flattening, otherwise normal  Recent Labs: 12/05/2016: B Natriuretic Peptide 33.7 09/16/2017: Magnesium 1.7 09/19/2017: ALT 45 09/20/2017: BUN 11; Creatinine, Ser 1.13; Hemoglobin 8.6; Platelets 181; Potassium 3.9; Sodium 136   Lipid Panel    Component Value Date/Time   CHOL 145 09/15/2012 0406   TRIG 95 09/15/2012 0406   HDL 62 09/15/2012 0406   CHOLHDL 2.3 09/15/2012 0406   VLDL 19 09/15/2012 0406   LDLCALC 64 09/15/2012 0406    ASSESSMENT:    1. Coronary artery disease involving native coronary artery of native heart without angina pectoris   2. Dyspnea on exertion   3. Essential hypertension   4. CKD (chronic kidney disease), stage III ([place])   5. High cholesterol   6. PAD (peripheral artery disease) ([place])   7. Anemia, unspecified type      PLAN:  In order of problems listed above:  1. CAD he had nonobstructive stenoses at the time of his cardiac catheterization about 3 years ago and has a normal nuclear perfusion study roughly 2 years ago. Asymptomatic. 2. Dyspnea: Etiology uncertain. Clearly will not tolerate diuretic therapy. 3. HTN with recent recurrent hypotensive episodes. He has lost a lot of weight. Still has orthostatic dizziness. Reduce amlodipine to 5 mg daily. Tolerate systolic blood pressure even in the 150s to avoid syncope. 4. CKD: S/p right nephrectomy for renal cell carcinoma. His moderate renal insufficiency and history of surgical single kidney make repeat angiography even less appealing, 5. HLP Current lipid-lowering regimen has provided excellent control.  Need updated labs 6. PAD: failed limb salvage intervention and required R foot amputation. 7. Anemia: I suspect this is the major reason for his  fatigue and breathlessness. We'll recheck his hemoglobin today. He should be transfused if his hemoglobin is less than 8, since he appears very symptomatic. I'm sure it's got something to do with his recent infection and surgical procedures. The pattern is not consistent with iron deficiency. He is on chronic aspirin and clopidogrel.   Medication Adjustments/Labs and Tests Ordered: Current medicines are reviewed at length with the patient today.  Concerns regarding medicines are outlined above.  Medication changes, Labs and Tests ordered today are listed in the Patient Instructions below.  Patient Instructions  Medication Instructions: Dr [name] has recommended making the following medication changes: 1. DECREASE Amlodipine to 5 mg daily  Labwork: Your physician recommends that you return for lab work at your earliest [place].  Testing/Procedures: NONE ORDERED  Follow-up: Dr [name] recommends that you schedule a follow-up appointment in 6 months. You will receive a reminder letter in the mail two months in advance. If you don't receive a letter, please call our office to schedule the follow-up appointment.  If you need a refill on your cardiac medications before your next appointment, please call your pharmacy.    Patient Instructions  Medication Instructions: Dr [name] has recommended making the following medication changes: 1. DECREASE Amlodipine to 5 mg daily  Labwork: Your physician recommends that you return for lab work at your earliest [place].  Testing/Procedures: NONE ORDERED  Follow-up: Dr [name] recommends that you schedule a follow-up appointment in 6 months.  You will receive a reminder letter in the mail two months in advance. If you don't receive a letter, please call our office to schedule the follow-up appointment.  If you need a refill on your cardiac medications before your next appointment, please call your  pharmacy. Signed, Sanda Klein, MD  09/30/2017 10:13 AM    [address], [place], [place]  [address] Phone: [phone]; Fax: [phone]

## 2017-09-24 DIAGNOSIS — D631 Anemia in chronic kidney disease: Secondary | ICD-10-CM | POA: Diagnosis not present

## 2017-09-24 DIAGNOSIS — Z89421 Acquired absence of other right toe(s): Secondary | ICD-10-CM | POA: Diagnosis not present

## 2017-09-24 DIAGNOSIS — N400 Enlarged prostate without lower urinary tract symptoms: Secondary | ICD-10-CM | POA: Diagnosis not present

## 2017-09-24 DIAGNOSIS — K219 Gastro-esophageal reflux disease without esophagitis: Secondary | ICD-10-CM | POA: Diagnosis not present

## 2017-09-24 DIAGNOSIS — I2510 Atherosclerotic heart disease of native coronary artery without angina pectoris: Secondary | ICD-10-CM | POA: Diagnosis not present

## 2017-09-24 DIAGNOSIS — J449 Chronic obstructive pulmonary disease, unspecified: Secondary | ICD-10-CM | POA: Diagnosis not present

## 2017-09-24 DIAGNOSIS — N183 Chronic kidney disease, stage 3 (moderate): Secondary | ICD-10-CM | POA: Diagnosis not present

## 2017-09-24 DIAGNOSIS — Z89411 Acquired absence of right great toe: Secondary | ICD-10-CM | POA: Diagnosis not present

## 2017-09-24 DIAGNOSIS — I129 Hypertensive chronic kidney disease with stage 1 through stage 4 chronic kidney disease, or unspecified chronic kidney disease: Secondary | ICD-10-CM | POA: Diagnosis not present

## 2017-09-24 LAB — CULTURE, BLOOD (ROUTINE X 2)
Culture: NO GROWTH
Culture: NO GROWTH
Special Requests: ADEQUATE

## 2017-09-25 ENCOUNTER — Other Ambulatory Visit: Payer: Self-pay | Admitting: Pharmacist

## 2017-09-25 NOTE — Telephone Encounter (Signed)
Entered by error

## 2017-09-27 ENCOUNTER — Encounter: Payer: Self-pay | Admitting: Podiatry

## 2017-09-27 ENCOUNTER — Ambulatory Visit (INDEPENDENT_AMBULATORY_CARE_PROVIDER_SITE_OTHER): Payer: Medicare Other | Admitting: Podiatry

## 2017-09-27 ENCOUNTER — Telehealth: Payer: Self-pay | Admitting: *Deleted

## 2017-09-27 VITALS — BP 77/56 | HR 63 | Temp 96.1°F | Resp 16

## 2017-09-27 DIAGNOSIS — Z9889 Other specified postprocedural states: Secondary | ICD-10-CM

## 2017-09-27 DIAGNOSIS — Z89439 Acquired absence of unspecified foot: Secondary | ICD-10-CM

## 2017-09-27 DIAGNOSIS — G8918 Other acute postprocedural pain: Secondary | ICD-10-CM

## 2017-09-27 DIAGNOSIS — T8130XA Disruption of wound, unspecified, initial encounter: Secondary | ICD-10-CM

## 2017-09-27 MED ORDER — HYDROMORPHONE HCL 2 MG PO TABS: 2.0000 mg | ORAL_TABLET | ORAL | 0 refills | Status: DC | PRN

## 2017-09-27 MED ORDER — SULFAMETHOXAZOLE-TRIMETHOPRIM 400-80 MG PO TABS: 1.0000 | ORAL_TABLET | Freq: Two times a day (BID) | ORAL | 0 refills | Status: DC

## 2017-09-27 NOTE — Telephone Encounter (Signed)
I spoke with Derrick [name], where pt gets Rocephin, she states pt has been receiving Rocephin supplies and the PICC scheduled to be continued last day 10/22/2017.

## 2017-09-27 NOTE — Telephone Encounter (Addendum)
[place] states pt's nurse Colletta Maryland has trained pt's son to perform PICC infusion and pt has been receiving the Rocephin daily as prescribed. Mickel Baas states even at training pt and wife did not understand the portal in his arm was PICC line. I informed Dr. [name] pt did have the PICC line and was receiving Rocephin daily. Faxed wound care orders for right foot to [facility].

## 2017-09-27 NOTE — Telephone Encounter (Signed)
Dr. [name] request [facility] betadine dressings to surgery site qod, IVPB Rocephin through PICC line. Dr. [name] states pt says he does not have a PICC line.

## 2017-09-27 NOTE — Telephone Encounter (Signed)
Derrick [name] - [facility] states pt was last seen 09/24/2017 by Colletta Maryland, they are not seeing pt every day, she will call pt's nurse to check PICC line status and call again.

## 2017-09-29 LAB — ACID FAST CULTURE WITH REFLEXED SENSITIVITIES: ACID FAST CULTURE - AFSCU3: NEGATIVE

## 2017-09-30 ENCOUNTER — Ambulatory Visit: Payer: Medicare Other | Admitting: Cardiovascular Disease

## 2017-09-30 ENCOUNTER — Encounter: Payer: Self-pay | Admitting: Cardiovascular Disease

## 2017-09-30 VITALS — BP 113/81 | HR 93 | Ht 72.0 in | Wt 165.0 lb

## 2017-09-30 DIAGNOSIS — Z89411 Acquired absence of right great toe: Secondary | ICD-10-CM | POA: Diagnosis not present

## 2017-09-30 DIAGNOSIS — D649 Anemia, unspecified: Secondary | ICD-10-CM

## 2017-09-30 DIAGNOSIS — I739 Peripheral vascular disease, unspecified: Secondary | ICD-10-CM

## 2017-09-30 DIAGNOSIS — I129 Hypertensive chronic kidney disease with stage 1 through stage 4 chronic kidney disease, or unspecified chronic kidney disease: Secondary | ICD-10-CM | POA: Diagnosis not present

## 2017-09-30 DIAGNOSIS — K219 Gastro-esophageal reflux disease without esophagitis: Secondary | ICD-10-CM | POA: Diagnosis not present

## 2017-09-30 DIAGNOSIS — N400 Enlarged prostate without lower urinary tract symptoms: Secondary | ICD-10-CM | POA: Diagnosis not present

## 2017-09-30 DIAGNOSIS — I10 Essential (primary) hypertension: Secondary | ICD-10-CM

## 2017-09-30 DIAGNOSIS — N183 Chronic kidney disease, stage 3 unspecified: Secondary | ICD-10-CM

## 2017-09-30 DIAGNOSIS — R0609 Other forms of dyspnea: Secondary | ICD-10-CM | POA: Diagnosis not present

## 2017-09-30 DIAGNOSIS — D631 Anemia in chronic kidney disease: Secondary | ICD-10-CM | POA: Diagnosis not present

## 2017-09-30 DIAGNOSIS — T8743 Infection of amputation stump, right lower extremity: Secondary | ICD-10-CM | POA: Diagnosis not present

## 2017-09-30 DIAGNOSIS — I2510 Atherosclerotic heart disease of native coronary artery without angina pectoris: Secondary | ICD-10-CM

## 2017-09-30 DIAGNOSIS — M869 Osteomyelitis, unspecified: Secondary | ICD-10-CM | POA: Diagnosis not present

## 2017-09-30 DIAGNOSIS — Z89421 Acquired absence of other right toe(s): Secondary | ICD-10-CM | POA: Diagnosis not present

## 2017-09-30 DIAGNOSIS — E7800 Pure hypercholesterolemia, unspecified: Secondary | ICD-10-CM | POA: Diagnosis not present

## 2017-09-30 DIAGNOSIS — J449 Chronic obstructive pulmonary disease, unspecified: Secondary | ICD-10-CM | POA: Diagnosis not present

## 2017-09-30 MED ORDER — AMLODIPINE BESYLATE 5 MG PO TABS: 5.0000 mg | ORAL_TABLET | Freq: Every day | ORAL | 3 refills | Status: DC

## 2017-09-30 NOTE — Patient Instructions (Signed)
Medication Instructions: Dr [name] has recommended making the following medication changes: 1. DECREASE Amlodipine to 5 mg daily  Labwork: Your physician recommends that you return for lab work at your earliest [place].  Testing/Procedures: NONE ORDERED  Follow-up: Dr [name] recommends that you schedule a follow-up appointment in 6 months. You will receive a reminder letter in the mail two months in advance. If you don't receive a letter, please call our office to schedule the follow-up appointment.  If you need a refill on your cardiac medications before your next appointment, please call your pharmacy.

## 2017-10-02 DIAGNOSIS — M79674 Pain in right toe(s): Secondary | ICD-10-CM | POA: Diagnosis not present

## 2017-10-02 DIAGNOSIS — N179 Acute kidney failure, unspecified: Secondary | ICD-10-CM | POA: Diagnosis not present

## 2017-10-04 ENCOUNTER — Encounter: Payer: Self-pay | Admitting: Podiatry

## 2017-10-04 ENCOUNTER — Ambulatory Visit (INDEPENDENT_AMBULATORY_CARE_PROVIDER_SITE_OTHER): Payer: Medicare Other | Admitting: Podiatry

## 2017-10-04 DIAGNOSIS — D649 Anemia, unspecified: Secondary | ICD-10-CM | POA: Diagnosis not present

## 2017-10-04 DIAGNOSIS — Z9889 Other specified postprocedural states: Secondary | ICD-10-CM

## 2017-10-04 DIAGNOSIS — E7800 Pure hypercholesterolemia, unspecified: Secondary | ICD-10-CM | POA: Diagnosis not present

## 2017-10-04 MED ORDER — HYDROMORPHONE HCL 2 MG PO TABS: 1.0000 mg | ORAL_TABLET | Freq: Four times a day (QID) | ORAL | 0 refills | Status: DC | PRN

## 2017-10-04 NOTE — Progress Notes (Signed)
  Subjective:  Patient ID: Derrick [name], male    DOB: Feb 06, 1932,  MRN: [id number]  Chief Complaint  Patient presents with  . Routine Post Op    DOS 12.07.2018 revision transmetatarsal amputation rt; pt stated, "Pain 5/10; foot throbs when walk on foot"   DOS: 09/13/17 Procedure: Revision TMA R foot  84 y.o. male returns for post-op check. Denies N/V/F/Ch. Pain is controlled with current medications.  Reports throbbing 5 out of 10 pain.  Unsure as to whether or not he is receiving home antibiotic therapy with home health  Objective:   General AA&O x3. Normal mood and affect.  Vascular Foot warm to touch  Neurologic Gross sensation intact.  Dermatologic  incision healing well medially and distally laterally still small dehiscence with fibrotic wound base without any probe to bone.   Orthopedic: Tenderness to palpation noted about the surgical site.   Assessment & Plan:  Patient was evaluated and treated and all questions answered.  S/p right TMA revision -Sutures: Intact. -Medications refilled: Dilaudid -Foot redressed. -Confirmed that patient is indeed receiving antibiotic therapy with home health.  Per hospitalist at discharge she was to receive IV ceftriaxone through PICC line.  Return in about 1 week (around 10/04/2017) for Wound Care.

## 2017-10-05 LAB — CBC
HEMOGLOBIN: 11.1 g/dL — AB (ref 13.0–17.7)
Hematocrit: 33.6 % — ABNORMAL LOW (ref 37.5–51.0)
MCH: 29.2 pg (ref 26.6–33.0)
MCHC: 33 g/dL (ref 31.5–35.7)
MCV: 88 fL (ref 79–97)
PLATELETS: 277 10*3/uL (ref 150–379)
RBC: 3.8 x10E6/uL — ABNORMAL LOW (ref 4.14–5.80)
RDW: 17.4 % — ABNORMAL HIGH (ref 12.3–15.4)
WBC: 8.7 10*3/uL (ref 3.4–10.8)

## 2017-10-05 LAB — LIPID PANEL
CHOLESTEROL TOTAL: 171 mg/dL (ref 100–199)
Chol/HDL Ratio: 2.8 ratio (ref 0.0–5.0)
HDL: 61 mg/dL (ref >39)
LDL CALC: 91 mg/dL (ref 0–99)
TRIGLYCERIDES: 95 mg/dL (ref 0–149)
VLDL Cholesterol Cal: 19 mg/dL (ref 5–40)

## 2017-10-05 NOTE — Progress Notes (Signed)
  Subjective:  Patient ID: Derrick [name], male    DOB: 16-Jun-1932,  MRN: [id number]  Chief Complaint  Patient presents with  . Routine Post Op    DOS 12.07.2018 revision transmetatarsal amputation rt   "Its not any better to me and that place on top wasn't draining before"   DOS: 09/13/2017 Procedure: Revision TMA right foot  83 y.o. male returns for post-op check. Denies N/V/F/Ch. Pain is controlled with current medications.  Reports that there is spots at the tip of the amputation site that is draining now when it was before.  States the outside is not doing any better.  Objective:   General AA&O x3. Normal mood and affect.  Vascular Foot warm and well perfused.  Neurologic Gross sensation intact.  Dermatologic  incision well-healed medially.  Small area of incomplete healing at the distal left of the incision however skin edges are rather coapted with intact suture material.  Lateral aspect of the wound with fibrotic base without evidence of probe to bone.   Orthopedic: Tenderness to palpation noted about the surgical site.    Assessment & Plan:  Patient was evaluated and treated and all questions answered.  S/p right TMA revision -Progressing as expected post-operatively. -Sutures: Left intact. -Medications refilled: Dilaudid -Foot redressed. -Continue antibiotics to completion -Lateral aspect of the incision gently debrided with a tissue nipper  Return in about 1 week (around 10/11/2017) for Wound Care.

## 2017-10-07 ENCOUNTER — Other Ambulatory Visit (HOSPITAL_COMMUNITY)
Admission: RE | Admit: 2017-10-07 | Discharge: 2017-10-07 | Disposition: A | Discharge status: Home / Self Care | Payer: Medicare Other | Source: Other Acute Inpatient Hospital | Attending: Infectious Diseases | Admitting: Infectious Diseases

## 2017-10-07 DIAGNOSIS — D631 Anemia in chronic kidney disease: Secondary | ICD-10-CM | POA: Diagnosis not present

## 2017-10-07 DIAGNOSIS — T8743 Infection of amputation stump, right lower extremity: Secondary | ICD-10-CM | POA: Insufficient documentation

## 2017-10-07 DIAGNOSIS — D649 Anemia, unspecified: Secondary | ICD-10-CM | POA: Diagnosis not present

## 2017-10-07 DIAGNOSIS — Z89411 Acquired absence of right great toe: Secondary | ICD-10-CM | POA: Diagnosis not present

## 2017-10-07 DIAGNOSIS — N183 Chronic kidney disease, stage 3 (moderate): Secondary | ICD-10-CM | POA: Diagnosis not present

## 2017-10-07 DIAGNOSIS — I96 Gangrene, not elsewhere classified: Secondary | ICD-10-CM | POA: Diagnosis not present

## 2017-10-07 DIAGNOSIS — E7800 Pure hypercholesterolemia, unspecified: Secondary | ICD-10-CM | POA: Insufficient documentation

## 2017-10-07 DIAGNOSIS — J449 Chronic obstructive pulmonary disease, unspecified: Secondary | ICD-10-CM | POA: Diagnosis not present

## 2017-10-07 DIAGNOSIS — Z89421 Acquired absence of other right toe(s): Secondary | ICD-10-CM | POA: Diagnosis not present

## 2017-10-07 DIAGNOSIS — N400 Enlarged prostate without lower urinary tract symptoms: Secondary | ICD-10-CM | POA: Diagnosis not present

## 2017-10-07 DIAGNOSIS — K219 Gastro-esophageal reflux disease without esophagitis: Secondary | ICD-10-CM | POA: Diagnosis not present

## 2017-10-07 DIAGNOSIS — I2510 Atherosclerotic heart disease of native coronary artery without angina pectoris: Secondary | ICD-10-CM | POA: Diagnosis not present

## 2017-10-07 DIAGNOSIS — I129 Hypertensive chronic kidney disease with stage 1 through stage 4 chronic kidney disease, or unspecified chronic kidney disease: Secondary | ICD-10-CM | POA: Diagnosis not present

## 2017-10-07 DIAGNOSIS — M869 Osteomyelitis, unspecified: Secondary | ICD-10-CM | POA: Diagnosis not present

## 2017-10-07 LAB — BASIC METABOLIC PANEL
ANION GAP: 8 (ref 5–15)
BUN: 17 mg/dL (ref 6–20)
CALCIUM: 8.8 mg/dL — AB (ref 8.9–10.3)
CHLORIDE: 103 mmol/L (ref 101–111)
CO2: 23 mmol/L (ref 22–32)
Creatinine, Ser: 1.17 mg/dL (ref 0.61–1.24)
GFR calc non Af Amer: 56 mL/min — ABNORMAL LOW (ref >60)
Glucose, Bld: 144 mg/dL — ABNORMAL HIGH (ref 65–99)
POTASSIUM: 4.1 mmol/L (ref 3.5–5.1)
Sodium: 134 mmol/L — ABNORMAL LOW (ref 135–145)

## 2017-10-07 LAB — CBC WITH DIFFERENTIAL/PLATELET
BASOS PCT: 0 %
Basophils Absolute: 0 10*3/uL (ref 0.0–0.1)
Eosinophils Absolute: 0.5 10*3/uL (ref 0.0–0.7)
Eosinophils Relative: 7 %
HEMATOCRIT: 31.5 % — AB (ref 39.0–52.0)
HEMOGLOBIN: 10.2 g/dL — AB (ref 13.0–17.0)
LYMPHS ABS: 1 10*3/uL (ref 0.7–4.0)
Lymphocytes Relative: 15 %
MCH: 29.1 pg (ref 26.0–34.0)
MCHC: 32.4 g/dL (ref 30.0–36.0)
MCV: 89.7 fL (ref 78.0–100.0)
MONOS PCT: 7 %
Monocytes Absolute: 0.5 10*3/uL (ref 0.1–1.0)
NEUTROS ABS: 4.7 10*3/uL (ref 1.7–7.7)
NEUTROS PCT: 71 %
Platelets: 225 10*3/uL (ref 150–400)
RBC: 3.51 MIL/uL — ABNORMAL LOW (ref 4.22–5.81)
RDW: 16.1 % — ABNORMAL HIGH (ref 11.5–15.5)
WBC: 6.7 10*3/uL (ref 4.0–10.5)

## 2017-10-07 LAB — C-REACTIVE PROTEIN: CRP: 0.9 mg/dL (ref <1.0)

## 2017-10-07 LAB — SEDIMENTATION RATE: SED RATE: 19 mm/h — AB (ref 0–16)

## 2017-10-08 DIAGNOSIS — M79674 Pain in right toe(s): Secondary | ICD-10-CM | POA: Diagnosis not present

## 2017-10-08 DIAGNOSIS — N179 Acute kidney failure, unspecified: Secondary | ICD-10-CM | POA: Diagnosis not present

## 2017-10-10 ENCOUNTER — Ambulatory Visit (INDEPENDENT_AMBULATORY_CARE_PROVIDER_SITE_OTHER): Payer: Medicare Other | Admitting: Podiatry

## 2017-10-10 DIAGNOSIS — Z09 Encounter for follow-up examination after completed treatment for conditions other than malignant neoplasm: Secondary | ICD-10-CM | POA: Diagnosis not present

## 2017-10-10 DIAGNOSIS — Z9889 Other specified postprocedural states: Secondary | ICD-10-CM

## 2017-10-10 DIAGNOSIS — I10 Essential (primary) hypertension: Secondary | ICD-10-CM | POA: Diagnosis not present

## 2017-10-11 ENCOUNTER — Other Ambulatory Visit: Payer: Self-pay | Admitting: Pharmacist

## 2017-10-13 LAB — FUNGUS CULTURE RESULT

## 2017-10-13 LAB — FUNGUS CULTURE WITH STAIN

## 2017-10-13 LAB — FUNGAL ORGANISM REFLEX

## 2017-10-14 ENCOUNTER — Encounter: Payer: Self-pay | Admitting: Infectious Diseases

## 2017-10-14 DIAGNOSIS — N183 Chronic kidney disease, stage 3 (moderate): Secondary | ICD-10-CM | POA: Diagnosis not present

## 2017-10-14 DIAGNOSIS — Z89421 Acquired absence of other right toe(s): Secondary | ICD-10-CM | POA: Diagnosis not present

## 2017-10-14 DIAGNOSIS — I129 Hypertensive chronic kidney disease with stage 1 through stage 4 chronic kidney disease, or unspecified chronic kidney disease: Secondary | ICD-10-CM | POA: Diagnosis not present

## 2017-10-14 DIAGNOSIS — T8743 Infection of amputation stump, right lower extremity: Secondary | ICD-10-CM | POA: Diagnosis not present

## 2017-10-14 DIAGNOSIS — N400 Enlarged prostate without lower urinary tract symptoms: Secondary | ICD-10-CM | POA: Diagnosis not present

## 2017-10-14 DIAGNOSIS — Z89411 Acquired absence of right great toe: Secondary | ICD-10-CM | POA: Diagnosis not present

## 2017-10-14 DIAGNOSIS — K219 Gastro-esophageal reflux disease without esophagitis: Secondary | ICD-10-CM | POA: Diagnosis not present

## 2017-10-14 DIAGNOSIS — J449 Chronic obstructive pulmonary disease, unspecified: Secondary | ICD-10-CM | POA: Diagnosis not present

## 2017-10-14 DIAGNOSIS — D631 Anemia in chronic kidney disease: Secondary | ICD-10-CM | POA: Diagnosis not present

## 2017-10-14 DIAGNOSIS — I2510 Atherosclerotic heart disease of native coronary artery without angina pectoris: Secondary | ICD-10-CM | POA: Diagnosis not present

## 2017-10-15 DIAGNOSIS — K219 Gastro-esophageal reflux disease without esophagitis: Secondary | ICD-10-CM | POA: Diagnosis not present

## 2017-10-15 DIAGNOSIS — I129 Hypertensive chronic kidney disease with stage 1 through stage 4 chronic kidney disease, or unspecified chronic kidney disease: Secondary | ICD-10-CM | POA: Diagnosis not present

## 2017-10-15 DIAGNOSIS — Z89411 Acquired absence of right great toe: Secondary | ICD-10-CM | POA: Diagnosis not present

## 2017-10-15 DIAGNOSIS — N400 Enlarged prostate without lower urinary tract symptoms: Secondary | ICD-10-CM | POA: Diagnosis not present

## 2017-10-15 DIAGNOSIS — Z89421 Acquired absence of other right toe(s): Secondary | ICD-10-CM | POA: Diagnosis not present

## 2017-10-15 DIAGNOSIS — I2510 Atherosclerotic heart disease of native coronary artery without angina pectoris: Secondary | ICD-10-CM | POA: Diagnosis not present

## 2017-10-15 DIAGNOSIS — N183 Chronic kidney disease, stage 3 (moderate): Secondary | ICD-10-CM | POA: Diagnosis not present

## 2017-10-15 DIAGNOSIS — J449 Chronic obstructive pulmonary disease, unspecified: Secondary | ICD-10-CM | POA: Diagnosis not present

## 2017-10-15 DIAGNOSIS — D631 Anemia in chronic kidney disease: Secondary | ICD-10-CM | POA: Diagnosis not present

## 2017-10-16 DIAGNOSIS — M79674 Pain in right toe(s): Secondary | ICD-10-CM | POA: Diagnosis not present

## 2017-10-16 DIAGNOSIS — N179 Acute kidney failure, unspecified: Secondary | ICD-10-CM | POA: Diagnosis not present

## 2017-10-17 ENCOUNTER — Telehealth: Payer: Self-pay | Admitting: *Deleted

## 2017-10-17 ENCOUNTER — Ambulatory Visit (INDEPENDENT_AMBULATORY_CARE_PROVIDER_SITE_OTHER): Payer: Medicare Other | Admitting: Podiatry

## 2017-10-17 ENCOUNTER — Other Ambulatory Visit: Payer: Self-pay | Admitting: Pharmacist

## 2017-10-17 DIAGNOSIS — Z9889 Other specified postprocedural states: Secondary | ICD-10-CM

## 2017-10-17 MED ORDER — COLLAGENASE 250 UNIT/GM EX OINT: 1.0000 "application " | TOPICAL_OINTMENT | Freq: Every day | CUTANEOUS | 0 refills | Status: DC

## 2017-10-17 NOTE — Telephone Encounter (Signed)
Dr. [name] ordered Santyl or Medihoney applied to lateral aspect of incision at open wound area, then cover with soft dressing 3 x week. Orders faxed to [facility].

## 2017-10-18 ENCOUNTER — Telehealth: Payer: Self-pay | Admitting: Podiatry

## 2017-10-18 DIAGNOSIS — I10 Essential (primary) hypertension: Secondary | ICD-10-CM | POA: Diagnosis not present

## 2017-10-18 NOTE — Telephone Encounter (Signed)
Hello, this is Nicolette with [facility]. I'm calling to get clarification on orders. When you can please give me a call back at [phone]. Thank you. Bye bye.

## 2017-10-18 NOTE — Telephone Encounter (Signed)
I called alternate [facility] phone and spoke with Nicolette she states she spoke with the nurses caring for pt, they say pt and wife change the dressing daily, nurses change dressing once week to check healing status and wound measurements and they would like to continue once a week. I okayed the once week per Dr. [name].

## 2017-10-18 NOTE — Telephone Encounter (Signed)
Unable to leave message for Nicolette - [facility] her mailbox is full.

## 2017-10-21 DIAGNOSIS — N400 Enlarged prostate without lower urinary tract symptoms: Secondary | ICD-10-CM | POA: Diagnosis not present

## 2017-10-21 DIAGNOSIS — Z89421 Acquired absence of other right toe(s): Secondary | ICD-10-CM | POA: Diagnosis not present

## 2017-10-21 DIAGNOSIS — K219 Gastro-esophageal reflux disease without esophagitis: Secondary | ICD-10-CM | POA: Diagnosis not present

## 2017-10-21 DIAGNOSIS — D631 Anemia in chronic kidney disease: Secondary | ICD-10-CM | POA: Diagnosis not present

## 2017-10-21 DIAGNOSIS — I129 Hypertensive chronic kidney disease with stage 1 through stage 4 chronic kidney disease, or unspecified chronic kidney disease: Secondary | ICD-10-CM | POA: Diagnosis not present

## 2017-10-21 DIAGNOSIS — J449 Chronic obstructive pulmonary disease, unspecified: Secondary | ICD-10-CM | POA: Diagnosis not present

## 2017-10-21 DIAGNOSIS — I2510 Atherosclerotic heart disease of native coronary artery without angina pectoris: Secondary | ICD-10-CM | POA: Diagnosis not present

## 2017-10-21 DIAGNOSIS — M869 Osteomyelitis, unspecified: Secondary | ICD-10-CM | POA: Diagnosis not present

## 2017-10-21 DIAGNOSIS — E876 Hypokalemia: Secondary | ICD-10-CM | POA: Diagnosis not present

## 2017-10-21 DIAGNOSIS — Z89411 Acquired absence of right great toe: Secondary | ICD-10-CM | POA: Diagnosis not present

## 2017-10-21 DIAGNOSIS — N183 Chronic kidney disease, stage 3 (moderate): Secondary | ICD-10-CM | POA: Diagnosis not present

## 2017-10-23 ENCOUNTER — Ambulatory Visit (INDEPENDENT_AMBULATORY_CARE_PROVIDER_SITE_OTHER): Payer: Medicare Other | Admitting: Podiatry

## 2017-10-23 ENCOUNTER — Telehealth: Payer: Self-pay | Admitting: Podiatry

## 2017-10-23 ENCOUNTER — Encounter: Payer: Self-pay | Admitting: Podiatry

## 2017-10-23 VITALS — BP 122/80 | HR 91 | Temp 97.3°F | Resp 18

## 2017-10-23 DIAGNOSIS — Z9889 Other specified postprocedural states: Secondary | ICD-10-CM

## 2017-10-23 NOTE — Progress Notes (Signed)
  Subjective:  Patient ID: Derrick [name], male    DOB: June 20, 1932,  MRN: [id number]  Chief Complaint  Patient presents with  . Routine Post Op    i have a spot on the side of my right foot     DOS: 09/13/18 Procedure: Revision TMA R Foot  84 y.o. male returns for post-op check. Denies N/V/F/Ch. Pain is controlled with current medications. States the outside of the foot is looking better.  Objective:   Vitals:   10/23/17 1547  BP: 122/80  Pulse: 91  Resp: 18  Temp: (!) 97.3 F (36.3 C)   General AA&O x3. Normal mood and affect.  Vascular R Foot warm to touch.  Neurologic Epicritic sensation grossly present.  Dermatologic (Wound) Wound Location: R Lateral Foot Wound Measurement: 3 x 2 x 0.2 Wound Base: Mixed Granular/Fibrotic Peri-wound: Normal Exudate: None: wound tissue dry  Wound progress: Improved since last check.  Orthopedic: MMT 5/5 in dorsiflexion, plantarflexion, inversion, and eversion. Normal lower extremity joint ROM without pain or crepitus.   Assessment & Plan:  Patient was evaluated and treated and all questions answered.  S/p R Foot Revision TMA -Sutures: partially removed today. Steri-strips applied. -Medications refilled: none -Foot redressed. Medihoney applied to lateral aspect of the wound. Continue Santyl with [facility].  No Follow-up on file.

## 2017-10-23 NOTE — Telephone Encounter (Signed)
I'm calling to get the appointment time for my husband's appointment today. If you would please call us back with that information at [phone]. Thank you.

## 2017-10-23 NOTE — Telephone Encounter (Signed)
I was calling to confirm the time of my appointment today. I informed the pt his appointment time is at 3:45 pm today.

## 2017-10-23 NOTE — Telephone Encounter (Signed)
Tried calling Mrs. Bagheri back to inform her of the appointment time for her husband but the line was busy.

## 2017-10-24 ENCOUNTER — Telehealth: Payer: Self-pay | Admitting: Podiatry

## 2017-10-24 DIAGNOSIS — M1990 Unspecified osteoarthritis, unspecified site: Secondary | ICD-10-CM | POA: Diagnosis not present

## 2017-10-24 DIAGNOSIS — D631 Anemia in chronic kidney disease: Secondary | ICD-10-CM | POA: Diagnosis not present

## 2017-10-24 DIAGNOSIS — M869 Osteomyelitis, unspecified: Secondary | ICD-10-CM | POA: Diagnosis not present

## 2017-10-24 DIAGNOSIS — N400 Enlarged prostate without lower urinary tract symptoms: Secondary | ICD-10-CM | POA: Diagnosis not present

## 2017-10-24 DIAGNOSIS — T8743 Infection of amputation stump, right lower extremity: Secondary | ICD-10-CM | POA: Diagnosis not present

## 2017-10-24 DIAGNOSIS — K219 Gastro-esophageal reflux disease without esophagitis: Secondary | ICD-10-CM | POA: Diagnosis not present

## 2017-10-24 DIAGNOSIS — J449 Chronic obstructive pulmonary disease, unspecified: Secondary | ICD-10-CM | POA: Diagnosis not present

## 2017-10-24 DIAGNOSIS — I739 Peripheral vascular disease, unspecified: Secondary | ICD-10-CM | POA: Diagnosis not present

## 2017-10-24 DIAGNOSIS — N183 Chronic kidney disease, stage 3 (moderate): Secondary | ICD-10-CM | POA: Diagnosis not present

## 2017-10-24 DIAGNOSIS — M069 Rheumatoid arthritis, unspecified: Secondary | ICD-10-CM | POA: Diagnosis not present

## 2017-10-24 DIAGNOSIS — I96 Gangrene, not elsewhere classified: Secondary | ICD-10-CM | POA: Diagnosis not present

## 2017-10-24 DIAGNOSIS — I2510 Atherosclerotic heart disease of native coronary artery without angina pectoris: Secondary | ICD-10-CM | POA: Diagnosis not present

## 2017-10-24 DIAGNOSIS — I129 Hypertensive chronic kidney disease with stage 1 through stage 4 chronic kidney disease, or unspecified chronic kidney disease: Secondary | ICD-10-CM | POA: Diagnosis not present

## 2017-10-24 NOTE — Telephone Encounter (Signed)
Hey, my name is Selmer Dominion and I'm one of the registered nurses with [facility]. I'm calling in regards to Derrick [name]. I was just inquiring because he is currently getting antibiotics through his PICC line and he doesn't know when he finishes those or what his next step is. If you could just give me a call back at your earliest convenience. My number is [phone]. Thank you. Look forward to hearing from you.

## 2017-10-25 ENCOUNTER — Telehealth: Payer: Self-pay | Admitting: Podiatry

## 2017-10-25 DIAGNOSIS — M79674 Pain in right toe(s): Secondary | ICD-10-CM | POA: Diagnosis not present

## 2017-10-25 DIAGNOSIS — N179 Acute kidney failure, unspecified: Secondary | ICD-10-CM | POA: Diagnosis not present

## 2017-10-25 NOTE — Telephone Encounter (Signed)
This is Selmer Dominion, RN with [facility]. I called yesterday and I just wanted to follow up because he only has one more dose of his antibiotic with the PICC line and he is just concerned because he didn't know when he was supposed to stop or if he needed more medicine. So if you would call me back at your earliest convenience my number is [phone]. (Then hung up)

## 2017-10-25 NOTE — Telephone Encounter (Addendum)
[facility] states needs, how often to be administered, H&P, Medications list, Allergies, Ht and Wt, orders for labs - [facility] [place] protocol, fax to [phone]. I confirmed with Jackelyn Poling I would fax the required information asap. Faxed required information to [facility] [phone].

## 2017-10-25 NOTE — Telephone Encounter (Signed)
Dr. [name] states continue the Rocephin 2 grams daily for 2 more weeks, and I informed Katie - [facility]. Faxed orders for Rocephin to [facility].

## 2017-10-25 NOTE — Telephone Encounter (Signed)
I called [facility] - Derrick [name] states she will get someone to talk some and left me on hold for 7 minutes.

## 2017-10-25 NOTE — Telephone Encounter (Signed)
This is Selmer Dominion, RN with [facility]. I called and spoke to [facility] earlier and we discuss the pt does need to keep taking his Rocephin and I know you guys had sent an order in for that to [facility]. I was wondering if we could get orders to see him three times a week for the next three weeks so that we can do more wound care and PICC line care. If someone could give me a call back at [phone]. You can leave a voicemail if I don't answer as it is a Engineer, production and I can get the verbal order. Thank you.

## 2017-10-25 NOTE — Telephone Encounter (Signed)
I informed [facility] - [facility] stated pt was getting the Rocephin for [place]. Debbie asked for [facility] contact [phone]. I refaxed the Rocephin orders to [facility]:  Debbie.

## 2017-10-25 NOTE — Telephone Encounter (Signed)
I spoke with [facility] and she states pt gets his Rocephin for [facility].

## 2017-10-25 NOTE — Telephone Encounter (Signed)
I called Derrick [name] and her voicemail gave contact number [facility] [phone].

## 2017-10-25 NOTE — Telephone Encounter (Signed)
I spoke with Derrick [name] and [place] and they state they need to take a verbal for the Rocephin. I informed them I had faxed the orders twice with confirmation each time, Jeani Hawking stated some times the faxes get backed up. I informed Jeani Hawking pt was to receive rocephin 2 grams daily by PICC.

## 2017-10-25 NOTE — Telephone Encounter (Signed)
Hi, this is Debbie calling from [place]. I received an order on Derrick [name]. There is a lot of information that is missing for me to process this referral. If you can please give me a call back at [phone]. Thank you so much. Bye bye.

## 2017-10-25 NOTE — Telephone Encounter (Signed)
[place] states pt is with [facility] and gets the medications from [facility].

## 2017-10-27 LAB — ACID FAST CULTURE WITH REFLEXED SENSITIVITIES
ACID FAST CULTURE - AFSCU3: NEGATIVE
ACID FAST CULTURE - AFSCU3: NEGATIVE
ACID FAST CULTURE - AFSCU3: NEGATIVE

## 2017-10-27 LAB — ACID FAST CULTURE WITH REFLEXED SENSITIVITIES (MYCOBACTERIA)
Acid Fast Culture: NEGATIVE
Acid Fast Culture: NEGATIVE

## 2017-10-30 ENCOUNTER — Ambulatory Visit (INDEPENDENT_AMBULATORY_CARE_PROVIDER_SITE_OTHER): Payer: Medicare Other | Admitting: Podiatry

## 2017-10-30 ENCOUNTER — Encounter: Payer: Self-pay | Admitting: Podiatry

## 2017-10-30 VITALS — BP 112/55 | HR 81 | Temp 97.9°F | Resp 18

## 2017-10-30 DIAGNOSIS — Z9889 Other specified postprocedural states: Secondary | ICD-10-CM

## 2017-10-31 ENCOUNTER — Telehealth: Payer: Self-pay | Admitting: *Deleted

## 2017-10-31 NOTE — Telephone Encounter (Addendum)
Dr.Price ordered apply medihoney to right lateral foot wound and dry dressing 3 times a week. Faxed orders to [facility]

## 2017-11-01 DIAGNOSIS — N179 Acute kidney failure, unspecified: Secondary | ICD-10-CM | POA: Diagnosis not present

## 2017-11-01 DIAGNOSIS — M79674 Pain in right toe(s): Secondary | ICD-10-CM | POA: Diagnosis not present

## 2017-11-04 ENCOUNTER — Telehealth: Payer: Self-pay | Admitting: *Deleted

## 2017-11-04 ENCOUNTER — Telehealth: Payer: Self-pay | Admitting: Podiatry

## 2017-11-04 NOTE — Telephone Encounter (Addendum)
[name], RN - [facility] Intake states needs PICC Placement Sheet/OR, lab orders for rocephin.

## 2017-11-04 NOTE — Telephone Encounter (Signed)
Faxed required form, LOV 10/30/2017, and demographics to [facility] Intake.

## 2017-11-04 NOTE — Telephone Encounter (Addendum)
I spoke with [facility] Rad[facility] and was transferred to Records, they faxed pt's angiogram 08/08/2017, which was not the PICC information. I spoke with [place] states [facility] was to continue pt's care for PICC and not just stop on the 10/21/2017, Stanton Kidney states she will send orders for the labs to be drawn by [facility] by [facility] and she will get the PICC Line Placement Sheet, since [place] is supplying the rocephin.

## 2017-11-04 NOTE — Telephone Encounter (Signed)
This is [name] an Intake Nurse with [facility] in [place]. We got orders today to resume care on Mr. Derrick [name] but we are going to have to do a new start of care so I'm going to need more information regarding this admission. If you would please return my call at [phone] ext [phone]. I appreciate it. Thank you.

## 2017-11-04 NOTE — Telephone Encounter (Addendum)
Derrick [name] - [facility] states pt was discharged 10/21/2017, would need to speak with Intake Department to reinstate, transferred to [place]. [name] states must complete referral packet, LOV, Orders and demographics and fax again.

## 2017-11-04 NOTE — Progress Notes (Signed)
  Subjective:  Patient ID: Derrick [name], male    DOB: [birth year],  MRN: [id number]  Chief Complaint  Patient presents with  . Routine Post Op     DOS 12.07.2018 REVISION TRANSMETATARSAL AMPUTATION RT    DOS: 09/13/18 Procedure: Revision TMA R Foot  84 y.o. male returns for post-op check. Denies N/V/F/Ch.Denies new issue. Concerned that [place] has no longer been coming.  Objective:   Vitals:   10/30/17 1454  BP: (!) 112/55  Pulse: 81  Resp: 18  Temp: 97.9 F (36.6 C)   General AA&O x3. Normal mood and affect.  Vascular R Foot warm to touch.  Neurologic Epicritic sensation grossly present.  Dermatologic (Wound) Wound Location: R Lateral Foot Wound Measurement: 2.5 x 2 x 0.2 Wound Base: Mixed Granular/Fibrotic Peri-wound: Normal Exudate: None: wound tissue dry  Wound progress: Improved since last check.  Orthopedic: MMT 5/5 in dorsiflexion, plantarflexion, inversion, and eversion. Normal lower extremity joint ROM without pain or crepitus.   Assessment & Plan:  Patient was evaluated and treated and all questions answered.  S/p R Foot Revision TMA -Sutures: sutures and staples removed today. -Medications refilled: none -Foot redressed.  -Continue Santyl or medihoney with [facility]. Will send new orders. -Wound improving without signs of acute infection.  Return in about 1 week (around 11/06/2017) for post op.

## 2017-11-04 NOTE — Telephone Encounter (Signed)
Done. Thanks.

## 2017-11-04 NOTE — Telephone Encounter (Signed)
Pt states [facility] said he had been discharged from their service.

## 2017-11-05 ENCOUNTER — Telehealth: Payer: Self-pay | Admitting: *Deleted

## 2017-11-05 DIAGNOSIS — N183 Chronic kidney disease, stage 3 (moderate): Secondary | ICD-10-CM | POA: Diagnosis not present

## 2017-11-05 DIAGNOSIS — Z7952 Long term (current) use of systemic steroids: Secondary | ICD-10-CM | POA: Diagnosis not present

## 2017-11-05 DIAGNOSIS — I129 Hypertensive chronic kidney disease with stage 1 through stage 4 chronic kidney disease, or unspecified chronic kidney disease: Secondary | ICD-10-CM | POA: Diagnosis not present

## 2017-11-05 DIAGNOSIS — N400 Enlarged prostate without lower urinary tract symptoms: Secondary | ICD-10-CM | POA: Diagnosis not present

## 2017-11-05 DIAGNOSIS — E7800 Pure hypercholesterolemia, unspecified: Secondary | ICD-10-CM | POA: Diagnosis not present

## 2017-11-05 DIAGNOSIS — Z452 Encounter for adjustment and management of vascular access device: Secondary | ICD-10-CM | POA: Diagnosis not present

## 2017-11-05 DIAGNOSIS — T8789 Other complications of amputation stump: Secondary | ICD-10-CM | POA: Diagnosis not present

## 2017-11-05 DIAGNOSIS — Z792 Long term (current) use of antibiotics: Secondary | ICD-10-CM | POA: Diagnosis not present

## 2017-11-05 DIAGNOSIS — K219 Gastro-esophageal reflux disease without esophagitis: Secondary | ICD-10-CM | POA: Diagnosis not present

## 2017-11-05 DIAGNOSIS — I2510 Atherosclerotic heart disease of native coronary artery without angina pectoris: Secondary | ICD-10-CM | POA: Diagnosis not present

## 2017-11-05 DIAGNOSIS — J449 Chronic obstructive pulmonary disease, unspecified: Secondary | ICD-10-CM | POA: Diagnosis not present

## 2017-11-05 DIAGNOSIS — I739 Peripheral vascular disease, unspecified: Secondary | ICD-10-CM | POA: Diagnosis not present

## 2017-11-05 DIAGNOSIS — T8141XA Infection following a procedure, superficial incisional surgical site, initial encounter: Secondary | ICD-10-CM | POA: Diagnosis not present

## 2017-11-05 DIAGNOSIS — Z7982 Long term (current) use of aspirin: Secondary | ICD-10-CM | POA: Diagnosis not present

## 2017-11-05 NOTE — Telephone Encounter (Signed)
[place] - [facility] called for wound care orders.

## 2017-11-05 NOTE — Telephone Encounter (Signed)
I informed [name] of Dr. Eleanora Neighbor orders to apply Medihoney to right foot 3x week with dry dressing.

## 2017-11-06 DIAGNOSIS — Z452 Encounter for adjustment and management of vascular access device: Secondary | ICD-10-CM | POA: Diagnosis not present

## 2017-11-06 DIAGNOSIS — N400 Enlarged prostate without lower urinary tract symptoms: Secondary | ICD-10-CM | POA: Diagnosis not present

## 2017-11-06 DIAGNOSIS — K219 Gastro-esophageal reflux disease without esophagitis: Secondary | ICD-10-CM | POA: Diagnosis not present

## 2017-11-06 DIAGNOSIS — N183 Chronic kidney disease, stage 3 (moderate): Secondary | ICD-10-CM | POA: Diagnosis not present

## 2017-11-06 DIAGNOSIS — M8600 Acute hematogenous osteomyelitis, unspecified site: Secondary | ICD-10-CM | POA: Diagnosis not present

## 2017-11-06 DIAGNOSIS — E7800 Pure hypercholesterolemia, unspecified: Secondary | ICD-10-CM | POA: Diagnosis not present

## 2017-11-06 DIAGNOSIS — I129 Hypertensive chronic kidney disease with stage 1 through stage 4 chronic kidney disease, or unspecified chronic kidney disease: Secondary | ICD-10-CM | POA: Diagnosis not present

## 2017-11-06 DIAGNOSIS — T8141XA Infection following a procedure, superficial incisional surgical site, initial encounter: Secondary | ICD-10-CM | POA: Diagnosis not present

## 2017-11-06 DIAGNOSIS — I2510 Atherosclerotic heart disease of native coronary artery without angina pectoris: Secondary | ICD-10-CM | POA: Diagnosis not present

## 2017-11-06 DIAGNOSIS — Z7982 Long term (current) use of aspirin: Secondary | ICD-10-CM | POA: Diagnosis not present

## 2017-11-06 DIAGNOSIS — Z7952 Long term (current) use of systemic steroids: Secondary | ICD-10-CM | POA: Diagnosis not present

## 2017-11-06 DIAGNOSIS — I739 Peripheral vascular disease, unspecified: Secondary | ICD-10-CM | POA: Diagnosis not present

## 2017-11-06 DIAGNOSIS — J449 Chronic obstructive pulmonary disease, unspecified: Secondary | ICD-10-CM | POA: Diagnosis not present

## 2017-11-06 DIAGNOSIS — Z792 Long term (current) use of antibiotics: Secondary | ICD-10-CM | POA: Diagnosis not present

## 2017-11-07 ENCOUNTER — Telehealth: Payer: Self-pay | Admitting: *Deleted

## 2017-11-07 ENCOUNTER — Telehealth: Payer: Self-pay | Admitting: Podiatry

## 2017-11-07 ENCOUNTER — Ambulatory Visit (INDEPENDENT_AMBULATORY_CARE_PROVIDER_SITE_OTHER): Payer: Medicare Other | Admitting: Podiatry

## 2017-11-07 DIAGNOSIS — Z9889 Other specified postprocedural states: Secondary | ICD-10-CM

## 2017-11-07 MED ORDER — MEDIHONEY WOUND/BURN DRESSING EX GEL: CUTANEOUS | 5 refills | Status: DC

## 2017-11-07 NOTE — Progress Notes (Signed)
Chief Complaint  Patient presents with  . Routine [facility] 01.10.2019 Amputation Toe Interphalangeal Partial Hallux and 2nd Lt  - patient doing well, in good spirits. States he has had pain all week on the top and outer edge of foot

## 2017-11-07 NOTE — Progress Notes (Signed)
  Subjective:  Patient ID: Derrick [name], male    DOB: 09-05-1932,  MRN: [id number]  84 y.o. male returns for wound care.  Status post TMA revision 09/13/2017.  Getting antibiotics from home health care to complete this week.  States that at his last home visit they did not apply any medicine to his foot but simply rewrapped it. Denies N/V/F/Ch.  Objective:  There were no vitals filed for this visit. General AA&O x3. Normal mood and affect.  Vascular Foot warm to touch.  Neurologic Sensation grossly diminished.  Dermatologic (Wound) Wound Location: Right foot lateral Wound Measurement: 3 x 3 x 0.2 Wound Base: Mixed Granular/Fibrotic Peri-wound: Normal Exudate: None: wound tissue dry  Wound progress: Decreased since last check.  Orthopedic: No pain to palpation either foot.   Assessment & Plan:  Patient was evaluated and treated and all questions answered.  Ulcer right lateral foot -Open ulceration laterally prepped with antibiotic cream and excisionally debrided with a tissue nipper.  Procedure covered under global. -Dressed with medihoney, DSD. -Remaining staples removed.  Sutures left intact.  Return in about 1 week (around 11/14/2017).

## 2017-11-07 NOTE — Telephone Encounter (Addendum)
Left message informing [name] - [facility] that Dr. [name] had wanted to make sure a nurse was putting Medihoney or Santyl on the right foot 3 times a week, and I had put in an orders for the Medihoney, to use Santyl if available.

## 2017-11-07 NOTE — Telephone Encounter (Signed)
Hi, this is Derrick [name], one of the RN's with [facility]. I had taken a wound care order for his foot for medihoney and his insurance will not cover that. So I need someone to call me back so we can figure out an alternative wound care or I didn't know if you wanted the pt to check on the out of pocket cost for the [place]. My call back is [phone]. Thank you.

## 2017-11-07 NOTE — Telephone Encounter (Signed)
I informed pt, [facility] was not being reimbursed for the Medihoney, and Dr. [name] ordered for pt to pick up. Pt states understanding.

## 2017-11-07 NOTE — Telephone Encounter (Signed)
Hi Valery, its Christain [place], the RN with [facility]. I did get your message in regards to Derrick [name]. We do have him down for three times a week however when he says nothing was put on at the admitting visit I didn't have the [place]. I did not see any Santyl in his supplies. I will have the nurse double check tomorrow when she goes out. If there is no Santyl the only other thing we can do without the ointment is put a dry dressing on or if he wants a wet to dry. We will follow up with the pt tomorrow to see if he is getting the [place] and if he has the Santyl in his home which like I said I did not see any in the box of supplies he as however I was not looking for it because I did not have that order at the time. So hopefully we can get this straightened out. I know if his insurance does not cover the medihoney at [facility] we can cover the order for Santyl. You can call me back at [phone].

## 2017-11-07 NOTE — Telephone Encounter (Signed)
-----   Message from [name], DPM sent at 11/07/2017  9:16 AM EST ----- Regarding: [place] Orders Can we make sure that patient's [place] has orders to apply  medihoney and a bandage? Pt states that last time they didn't put anything on it just rewrapped it.

## 2017-11-07 NOTE — Telephone Encounter (Signed)
I spoke with Ms Derrick [name] and she states she will check with pt to see if he got the Medihoney and if not she will stop by and get some from our office.

## 2017-11-08 ENCOUNTER — Telehealth: Payer: Self-pay | Admitting: *Deleted

## 2017-11-08 ENCOUNTER — Telehealth: Payer: Self-pay

## 2017-11-08 DIAGNOSIS — Z7952 Long term (current) use of systemic steroids: Secondary | ICD-10-CM | POA: Diagnosis not present

## 2017-11-08 DIAGNOSIS — Z452 Encounter for adjustment and management of vascular access device: Secondary | ICD-10-CM | POA: Diagnosis not present

## 2017-11-08 DIAGNOSIS — I2510 Atherosclerotic heart disease of native coronary artery without angina pectoris: Secondary | ICD-10-CM | POA: Diagnosis not present

## 2017-11-08 DIAGNOSIS — Z792 Long term (current) use of antibiotics: Secondary | ICD-10-CM | POA: Diagnosis not present

## 2017-11-08 DIAGNOSIS — N400 Enlarged prostate without lower urinary tract symptoms: Secondary | ICD-10-CM | POA: Diagnosis not present

## 2017-11-08 DIAGNOSIS — K219 Gastro-esophageal reflux disease without esophagitis: Secondary | ICD-10-CM | POA: Diagnosis not present

## 2017-11-08 DIAGNOSIS — T8141XA Infection following a procedure, superficial incisional surgical site, initial encounter: Secondary | ICD-10-CM | POA: Diagnosis not present

## 2017-11-08 DIAGNOSIS — Z7982 Long term (current) use of aspirin: Secondary | ICD-10-CM | POA: Diagnosis not present

## 2017-11-08 DIAGNOSIS — N183 Chronic kidney disease, stage 3 (moderate): Secondary | ICD-10-CM | POA: Diagnosis not present

## 2017-11-08 DIAGNOSIS — E7800 Pure hypercholesterolemia, unspecified: Secondary | ICD-10-CM | POA: Diagnosis not present

## 2017-11-08 DIAGNOSIS — I739 Peripheral vascular disease, unspecified: Secondary | ICD-10-CM | POA: Diagnosis not present

## 2017-11-08 DIAGNOSIS — I129 Hypertensive chronic kidney disease with stage 1 through stage 4 chronic kidney disease, or unspecified chronic kidney disease: Secondary | ICD-10-CM | POA: Diagnosis not present

## 2017-11-08 DIAGNOSIS — J449 Chronic obstructive pulmonary disease, unspecified: Secondary | ICD-10-CM | POA: Diagnosis not present

## 2017-11-08 NOTE — Telephone Encounter (Signed)
Orders to pull PICC line called to [facility]. [name] states pt does have the Medihoney, LPN went out today to dress.

## 2017-11-08 NOTE — Telephone Encounter (Signed)
[place] - [facility] asked for order to stop PICC line. Dr. [name] states stop the PICC line and may pull.

## 2017-11-08 NOTE — Telephone Encounter (Signed)
Derrick [name] with [facility]  Left message on machine regarding PICC orders . She wanted to know if PICC could be pulled since antibiotics are finished.   Returned calls she spoke with wound care and was  given permission to pull PICC.   Laverle Patter, RN

## 2017-11-09 DIAGNOSIS — K219 Gastro-esophageal reflux disease without esophagitis: Secondary | ICD-10-CM | POA: Diagnosis not present

## 2017-11-09 DIAGNOSIS — Z452 Encounter for adjustment and management of vascular access device: Secondary | ICD-10-CM | POA: Diagnosis not present

## 2017-11-09 DIAGNOSIS — T8141XA Infection following a procedure, superficial incisional surgical site, initial encounter: Secondary | ICD-10-CM | POA: Diagnosis not present

## 2017-11-09 DIAGNOSIS — Z7952 Long term (current) use of systemic steroids: Secondary | ICD-10-CM | POA: Diagnosis not present

## 2017-11-09 DIAGNOSIS — N183 Chronic kidney disease, stage 3 (moderate): Secondary | ICD-10-CM | POA: Diagnosis not present

## 2017-11-09 DIAGNOSIS — I2510 Atherosclerotic heart disease of native coronary artery without angina pectoris: Secondary | ICD-10-CM | POA: Diagnosis not present

## 2017-11-09 DIAGNOSIS — Z7982 Long term (current) use of aspirin: Secondary | ICD-10-CM | POA: Diagnosis not present

## 2017-11-09 DIAGNOSIS — I739 Peripheral vascular disease, unspecified: Secondary | ICD-10-CM | POA: Diagnosis not present

## 2017-11-09 DIAGNOSIS — Z792 Long term (current) use of antibiotics: Secondary | ICD-10-CM | POA: Diagnosis not present

## 2017-11-09 DIAGNOSIS — I129 Hypertensive chronic kidney disease with stage 1 through stage 4 chronic kidney disease, or unspecified chronic kidney disease: Secondary | ICD-10-CM | POA: Diagnosis not present

## 2017-11-09 DIAGNOSIS — N400 Enlarged prostate without lower urinary tract symptoms: Secondary | ICD-10-CM | POA: Diagnosis not present

## 2017-11-09 DIAGNOSIS — J449 Chronic obstructive pulmonary disease, unspecified: Secondary | ICD-10-CM | POA: Diagnosis not present

## 2017-11-09 DIAGNOSIS — E7800 Pure hypercholesterolemia, unspecified: Secondary | ICD-10-CM | POA: Diagnosis not present

## 2017-11-11 DIAGNOSIS — N400 Enlarged prostate without lower urinary tract symptoms: Secondary | ICD-10-CM | POA: Diagnosis not present

## 2017-11-11 DIAGNOSIS — Z792 Long term (current) use of antibiotics: Secondary | ICD-10-CM | POA: Diagnosis not present

## 2017-11-11 DIAGNOSIS — N183 Chronic kidney disease, stage 3 (moderate): Secondary | ICD-10-CM | POA: Diagnosis not present

## 2017-11-11 DIAGNOSIS — Z7982 Long term (current) use of aspirin: Secondary | ICD-10-CM | POA: Diagnosis not present

## 2017-11-11 DIAGNOSIS — T8141XA Infection following a procedure, superficial incisional surgical site, initial encounter: Secondary | ICD-10-CM | POA: Diagnosis not present

## 2017-11-11 DIAGNOSIS — I2510 Atherosclerotic heart disease of native coronary artery without angina pectoris: Secondary | ICD-10-CM | POA: Diagnosis not present

## 2017-11-11 DIAGNOSIS — Z7952 Long term (current) use of systemic steroids: Secondary | ICD-10-CM | POA: Diagnosis not present

## 2017-11-11 DIAGNOSIS — I129 Hypertensive chronic kidney disease with stage 1 through stage 4 chronic kidney disease, or unspecified chronic kidney disease: Secondary | ICD-10-CM | POA: Diagnosis not present

## 2017-11-11 DIAGNOSIS — J449 Chronic obstructive pulmonary disease, unspecified: Secondary | ICD-10-CM | POA: Diagnosis not present

## 2017-11-11 DIAGNOSIS — I739 Peripheral vascular disease, unspecified: Secondary | ICD-10-CM | POA: Diagnosis not present

## 2017-11-11 DIAGNOSIS — E7800 Pure hypercholesterolemia, unspecified: Secondary | ICD-10-CM | POA: Diagnosis not present

## 2017-11-11 DIAGNOSIS — Z452 Encounter for adjustment and management of vascular access device: Secondary | ICD-10-CM | POA: Diagnosis not present

## 2017-11-11 DIAGNOSIS — K219 Gastro-esophageal reflux disease without esophagitis: Secondary | ICD-10-CM | POA: Diagnosis not present

## 2017-11-14 ENCOUNTER — Ambulatory Visit (INDEPENDENT_AMBULATORY_CARE_PROVIDER_SITE_OTHER): Payer: Medicare Other | Admitting: Podiatry

## 2017-11-14 VITALS — BP 134/81 | HR 85 | Temp 98.6°F

## 2017-11-14 DIAGNOSIS — Z9889 Other specified postprocedural states: Secondary | ICD-10-CM

## 2017-11-15 DIAGNOSIS — Z7952 Long term (current) use of systemic steroids: Secondary | ICD-10-CM | POA: Diagnosis not present

## 2017-11-15 DIAGNOSIS — I739 Peripheral vascular disease, unspecified: Secondary | ICD-10-CM | POA: Diagnosis not present

## 2017-11-15 DIAGNOSIS — N400 Enlarged prostate without lower urinary tract symptoms: Secondary | ICD-10-CM | POA: Diagnosis not present

## 2017-11-15 DIAGNOSIS — Z7982 Long term (current) use of aspirin: Secondary | ICD-10-CM | POA: Diagnosis not present

## 2017-11-15 DIAGNOSIS — E7800 Pure hypercholesterolemia, unspecified: Secondary | ICD-10-CM | POA: Diagnosis not present

## 2017-11-15 DIAGNOSIS — J449 Chronic obstructive pulmonary disease, unspecified: Secondary | ICD-10-CM | POA: Diagnosis not present

## 2017-11-15 DIAGNOSIS — I2510 Atherosclerotic heart disease of native coronary artery without angina pectoris: Secondary | ICD-10-CM | POA: Diagnosis not present

## 2017-11-15 DIAGNOSIS — Z452 Encounter for adjustment and management of vascular access device: Secondary | ICD-10-CM | POA: Diagnosis not present

## 2017-11-15 DIAGNOSIS — T8141XA Infection following a procedure, superficial incisional surgical site, initial encounter: Secondary | ICD-10-CM | POA: Diagnosis not present

## 2017-11-15 DIAGNOSIS — K219 Gastro-esophageal reflux disease without esophagitis: Secondary | ICD-10-CM | POA: Diagnosis not present

## 2017-11-15 DIAGNOSIS — Z792 Long term (current) use of antibiotics: Secondary | ICD-10-CM | POA: Diagnosis not present

## 2017-11-15 DIAGNOSIS — N183 Chronic kidney disease, stage 3 (moderate): Secondary | ICD-10-CM | POA: Diagnosis not present

## 2017-11-15 DIAGNOSIS — I129 Hypertensive chronic kidney disease with stage 1 through stage 4 chronic kidney disease, or unspecified chronic kidney disease: Secondary | ICD-10-CM | POA: Diagnosis not present

## 2017-11-18 DIAGNOSIS — Z792 Long term (current) use of antibiotics: Secondary | ICD-10-CM | POA: Diagnosis not present

## 2017-11-18 DIAGNOSIS — Z7982 Long term (current) use of aspirin: Secondary | ICD-10-CM | POA: Diagnosis not present

## 2017-11-18 DIAGNOSIS — J449 Chronic obstructive pulmonary disease, unspecified: Secondary | ICD-10-CM | POA: Diagnosis not present

## 2017-11-18 DIAGNOSIS — I739 Peripheral vascular disease, unspecified: Secondary | ICD-10-CM | POA: Diagnosis not present

## 2017-11-18 DIAGNOSIS — Z7952 Long term (current) use of systemic steroids: Secondary | ICD-10-CM | POA: Diagnosis not present

## 2017-11-18 DIAGNOSIS — E7800 Pure hypercholesterolemia, unspecified: Secondary | ICD-10-CM | POA: Diagnosis not present

## 2017-11-18 DIAGNOSIS — I2510 Atherosclerotic heart disease of native coronary artery without angina pectoris: Secondary | ICD-10-CM | POA: Diagnosis not present

## 2017-11-18 DIAGNOSIS — N183 Chronic kidney disease, stage 3 (moderate): Secondary | ICD-10-CM | POA: Diagnosis not present

## 2017-11-18 DIAGNOSIS — Z452 Encounter for adjustment and management of vascular access device: Secondary | ICD-10-CM | POA: Diagnosis not present

## 2017-11-18 DIAGNOSIS — N400 Enlarged prostate without lower urinary tract symptoms: Secondary | ICD-10-CM | POA: Diagnosis not present

## 2017-11-18 DIAGNOSIS — I129 Hypertensive chronic kidney disease with stage 1 through stage 4 chronic kidney disease, or unspecified chronic kidney disease: Secondary | ICD-10-CM | POA: Diagnosis not present

## 2017-11-18 DIAGNOSIS — T8141XA Infection following a procedure, superficial incisional surgical site, initial encounter: Secondary | ICD-10-CM | POA: Diagnosis not present

## 2017-11-18 DIAGNOSIS — K219 Gastro-esophageal reflux disease without esophagitis: Secondary | ICD-10-CM | POA: Diagnosis not present

## 2017-11-20 ENCOUNTER — Ambulatory Visit (INDEPENDENT_AMBULATORY_CARE_PROVIDER_SITE_OTHER): Payer: Medicare Other | Admitting: Podiatry

## 2017-11-20 DIAGNOSIS — Z9889 Other specified postprocedural states: Secondary | ICD-10-CM

## 2017-11-20 DIAGNOSIS — E7800 Pure hypercholesterolemia, unspecified: Secondary | ICD-10-CM | POA: Diagnosis not present

## 2017-11-20 DIAGNOSIS — T8141XA Infection following a procedure, superficial incisional surgical site, initial encounter: Secondary | ICD-10-CM | POA: Diagnosis not present

## 2017-11-20 DIAGNOSIS — I2510 Atherosclerotic heart disease of native coronary artery without angina pectoris: Secondary | ICD-10-CM | POA: Diagnosis not present

## 2017-11-20 DIAGNOSIS — N400 Enlarged prostate without lower urinary tract symptoms: Secondary | ICD-10-CM | POA: Diagnosis not present

## 2017-11-20 DIAGNOSIS — I129 Hypertensive chronic kidney disease with stage 1 through stage 4 chronic kidney disease, or unspecified chronic kidney disease: Secondary | ICD-10-CM | POA: Diagnosis not present

## 2017-11-20 DIAGNOSIS — I739 Peripheral vascular disease, unspecified: Secondary | ICD-10-CM | POA: Diagnosis not present

## 2017-11-20 DIAGNOSIS — Z792 Long term (current) use of antibiotics: Secondary | ICD-10-CM | POA: Diagnosis not present

## 2017-11-20 DIAGNOSIS — Z7982 Long term (current) use of aspirin: Secondary | ICD-10-CM | POA: Diagnosis not present

## 2017-11-20 DIAGNOSIS — J449 Chronic obstructive pulmonary disease, unspecified: Secondary | ICD-10-CM | POA: Diagnosis not present

## 2017-11-20 DIAGNOSIS — K219 Gastro-esophageal reflux disease without esophagitis: Secondary | ICD-10-CM | POA: Diagnosis not present

## 2017-11-20 DIAGNOSIS — Z7952 Long term (current) use of systemic steroids: Secondary | ICD-10-CM | POA: Diagnosis not present

## 2017-11-20 DIAGNOSIS — Z452 Encounter for adjustment and management of vascular access device: Secondary | ICD-10-CM | POA: Diagnosis not present

## 2017-11-20 DIAGNOSIS — N183 Chronic kidney disease, stage 3 (moderate): Secondary | ICD-10-CM | POA: Diagnosis not present

## 2017-11-21 NOTE — Progress Notes (Signed)
  Subjective:  Patient ID: Derrick [name], male    DOB: [birth year],  MRN: [id number]  Chief Complaint  Patient presents with  . Wound Check    Wound care Right foot - patient still has PICC line   84 y.o. male returns for wound care. Believes the wound to be improving.  States that home health care stopped coming.  States that he believes he still has the PICC line in.. Denies N/V/F/Ch.  Objective:  There were no vitals filed for this visit. General AA&O x3. Normal mood and affect.  Vascular Foot warm to touch.  Neurologic Sensation grossly diminished.  Dermatologic (Wound) Wound Location: Right lateral forefoot Wound Measurement: 2.5 x 2.5 x 0.1 Wound Base: Mixed Granular/Fibrotic Peri-wound: Normal Exudate: None: wound tissue dry  Wound progress: Improved since last check.  Orthopedic: No pain to palpation either foot.   Assessment & Plan:  Patient was evaluated and treated and all questions answered.  Ulcer right lateral foot secondary to dehiscence from transmetatarsal application -Gently debrided with gauze to remove fibrotic tissue. -Medihoney and dry sterile dressing applied -New orders for home health care -PICC site inspected.  Patient had PICC removed previously; Still had dressing from PICC which was removed today.   Return in about 2 weeks (around 12/04/2017).

## 2017-11-22 DIAGNOSIS — I129 Hypertensive chronic kidney disease with stage 1 through stage 4 chronic kidney disease, or unspecified chronic kidney disease: Secondary | ICD-10-CM | POA: Diagnosis not present

## 2017-11-22 DIAGNOSIS — E7800 Pure hypercholesterolemia, unspecified: Secondary | ICD-10-CM | POA: Diagnosis not present

## 2017-11-22 DIAGNOSIS — Z792 Long term (current) use of antibiotics: Secondary | ICD-10-CM | POA: Diagnosis not present

## 2017-11-22 DIAGNOSIS — N400 Enlarged prostate without lower urinary tract symptoms: Secondary | ICD-10-CM | POA: Diagnosis not present

## 2017-11-22 DIAGNOSIS — T8141XA Infection following a procedure, superficial incisional surgical site, initial encounter: Secondary | ICD-10-CM | POA: Diagnosis not present

## 2017-11-22 DIAGNOSIS — Z452 Encounter for adjustment and management of vascular access device: Secondary | ICD-10-CM | POA: Diagnosis not present

## 2017-11-22 DIAGNOSIS — N183 Chronic kidney disease, stage 3 (moderate): Secondary | ICD-10-CM | POA: Diagnosis not present

## 2017-11-22 DIAGNOSIS — J449 Chronic obstructive pulmonary disease, unspecified: Secondary | ICD-10-CM | POA: Diagnosis not present

## 2017-11-22 DIAGNOSIS — I2510 Atherosclerotic heart disease of native coronary artery without angina pectoris: Secondary | ICD-10-CM | POA: Diagnosis not present

## 2017-11-22 DIAGNOSIS — K219 Gastro-esophageal reflux disease without esophagitis: Secondary | ICD-10-CM | POA: Diagnosis not present

## 2017-11-22 DIAGNOSIS — I739 Peripheral vascular disease, unspecified: Secondary | ICD-10-CM | POA: Diagnosis not present

## 2017-11-22 DIAGNOSIS — Z7982 Long term (current) use of aspirin: Secondary | ICD-10-CM | POA: Diagnosis not present

## 2017-11-22 DIAGNOSIS — Z7952 Long term (current) use of systemic steroids: Secondary | ICD-10-CM | POA: Diagnosis not present

## 2017-11-25 DIAGNOSIS — J449 Chronic obstructive pulmonary disease, unspecified: Secondary | ICD-10-CM | POA: Diagnosis not present

## 2017-11-25 DIAGNOSIS — N183 Chronic kidney disease, stage 3 (moderate): Secondary | ICD-10-CM | POA: Diagnosis not present

## 2017-11-25 DIAGNOSIS — E7800 Pure hypercholesterolemia, unspecified: Secondary | ICD-10-CM | POA: Diagnosis not present

## 2017-11-25 DIAGNOSIS — Z7982 Long term (current) use of aspirin: Secondary | ICD-10-CM | POA: Diagnosis not present

## 2017-11-25 DIAGNOSIS — I2510 Atherosclerotic heart disease of native coronary artery without angina pectoris: Secondary | ICD-10-CM | POA: Diagnosis not present

## 2017-11-25 DIAGNOSIS — Z452 Encounter for adjustment and management of vascular access device: Secondary | ICD-10-CM | POA: Diagnosis not present

## 2017-11-25 DIAGNOSIS — Z792 Long term (current) use of antibiotics: Secondary | ICD-10-CM | POA: Diagnosis not present

## 2017-11-25 DIAGNOSIS — Z7952 Long term (current) use of systemic steroids: Secondary | ICD-10-CM | POA: Diagnosis not present

## 2017-11-25 DIAGNOSIS — K219 Gastro-esophageal reflux disease without esophagitis: Secondary | ICD-10-CM | POA: Diagnosis not present

## 2017-11-25 DIAGNOSIS — I129 Hypertensive chronic kidney disease with stage 1 through stage 4 chronic kidney disease, or unspecified chronic kidney disease: Secondary | ICD-10-CM | POA: Diagnosis not present

## 2017-11-25 DIAGNOSIS — N400 Enlarged prostate without lower urinary tract symptoms: Secondary | ICD-10-CM | POA: Diagnosis not present

## 2017-11-25 DIAGNOSIS — T8141XA Infection following a procedure, superficial incisional surgical site, initial encounter: Secondary | ICD-10-CM | POA: Diagnosis not present

## 2017-11-25 DIAGNOSIS — I739 Peripheral vascular disease, unspecified: Secondary | ICD-10-CM | POA: Diagnosis not present

## 2017-11-27 DIAGNOSIS — E7800 Pure hypercholesterolemia, unspecified: Secondary | ICD-10-CM | POA: Diagnosis not present

## 2017-11-27 DIAGNOSIS — N183 Chronic kidney disease, stage 3 (moderate): Secondary | ICD-10-CM | POA: Diagnosis not present

## 2017-11-27 DIAGNOSIS — I739 Peripheral vascular disease, unspecified: Secondary | ICD-10-CM | POA: Diagnosis not present

## 2017-11-27 DIAGNOSIS — K219 Gastro-esophageal reflux disease without esophagitis: Secondary | ICD-10-CM | POA: Diagnosis not present

## 2017-11-27 DIAGNOSIS — Z7982 Long term (current) use of aspirin: Secondary | ICD-10-CM | POA: Diagnosis not present

## 2017-11-27 DIAGNOSIS — J449 Chronic obstructive pulmonary disease, unspecified: Secondary | ICD-10-CM | POA: Diagnosis not present

## 2017-11-27 DIAGNOSIS — Z792 Long term (current) use of antibiotics: Secondary | ICD-10-CM | POA: Diagnosis not present

## 2017-11-27 DIAGNOSIS — N400 Enlarged prostate without lower urinary tract symptoms: Secondary | ICD-10-CM | POA: Diagnosis not present

## 2017-11-27 DIAGNOSIS — I2510 Atherosclerotic heart disease of native coronary artery without angina pectoris: Secondary | ICD-10-CM | POA: Diagnosis not present

## 2017-11-27 DIAGNOSIS — Z452 Encounter for adjustment and management of vascular access device: Secondary | ICD-10-CM | POA: Diagnosis not present

## 2017-11-27 DIAGNOSIS — T8141XA Infection following a procedure, superficial incisional surgical site, initial encounter: Secondary | ICD-10-CM | POA: Diagnosis not present

## 2017-11-27 DIAGNOSIS — Z7952 Long term (current) use of systemic steroids: Secondary | ICD-10-CM | POA: Diagnosis not present

## 2017-11-27 DIAGNOSIS — I129 Hypertensive chronic kidney disease with stage 1 through stage 4 chronic kidney disease, or unspecified chronic kidney disease: Secondary | ICD-10-CM | POA: Diagnosis not present

## 2017-12-04 ENCOUNTER — Ambulatory Visit (INDEPENDENT_AMBULATORY_CARE_PROVIDER_SITE_OTHER): Payer: Medicare Other | Admitting: Podiatry

## 2017-12-04 ENCOUNTER — Encounter: Payer: Self-pay | Admitting: Podiatry

## 2017-12-04 VITALS — Temp 97.8°F

## 2017-12-04 DIAGNOSIS — I10 Essential (primary) hypertension: Secondary | ICD-10-CM | POA: Diagnosis not present

## 2017-12-04 DIAGNOSIS — Z89439 Acquired absence of unspecified foot: Secondary | ICD-10-CM

## 2017-12-04 DIAGNOSIS — E7800 Pure hypercholesterolemia, unspecified: Secondary | ICD-10-CM | POA: Diagnosis not present

## 2017-12-04 DIAGNOSIS — T8131XD Disruption of external operation (surgical) wound, not elsewhere classified, subsequent encounter: Secondary | ICD-10-CM

## 2017-12-04 DIAGNOSIS — Z9889 Other specified postprocedural states: Secondary | ICD-10-CM

## 2017-12-05 ENCOUNTER — Telehealth: Payer: Self-pay | Admitting: Podiatry

## 2017-12-05 ENCOUNTER — Telehealth: Payer: Self-pay | Admitting: Cardiovascular Disease

## 2017-12-05 DIAGNOSIS — J449 Chronic obstructive pulmonary disease, unspecified: Secondary | ICD-10-CM | POA: Diagnosis not present

## 2017-12-05 DIAGNOSIS — N400 Enlarged prostate without lower urinary tract symptoms: Secondary | ICD-10-CM | POA: Diagnosis not present

## 2017-12-05 DIAGNOSIS — Z7952 Long term (current) use of systemic steroids: Secondary | ICD-10-CM | POA: Diagnosis not present

## 2017-12-05 DIAGNOSIS — I2510 Atherosclerotic heart disease of native coronary artery without angina pectoris: Secondary | ICD-10-CM | POA: Diagnosis not present

## 2017-12-05 DIAGNOSIS — Z7982 Long term (current) use of aspirin: Secondary | ICD-10-CM | POA: Diagnosis not present

## 2017-12-05 DIAGNOSIS — I129 Hypertensive chronic kidney disease with stage 1 through stage 4 chronic kidney disease, or unspecified chronic kidney disease: Secondary | ICD-10-CM | POA: Diagnosis not present

## 2017-12-05 DIAGNOSIS — E7800 Pure hypercholesterolemia, unspecified: Secondary | ICD-10-CM | POA: Diagnosis not present

## 2017-12-05 DIAGNOSIS — N183 Chronic kidney disease, stage 3 (moderate): Secondary | ICD-10-CM | POA: Diagnosis not present

## 2017-12-05 DIAGNOSIS — Z452 Encounter for adjustment and management of vascular access device: Secondary | ICD-10-CM | POA: Diagnosis not present

## 2017-12-05 DIAGNOSIS — Z792 Long term (current) use of antibiotics: Secondary | ICD-10-CM | POA: Diagnosis not present

## 2017-12-05 DIAGNOSIS — K219 Gastro-esophageal reflux disease without esophagitis: Secondary | ICD-10-CM | POA: Diagnosis not present

## 2017-12-05 DIAGNOSIS — I739 Peripheral vascular disease, unspecified: Secondary | ICD-10-CM | POA: Diagnosis not present

## 2017-12-05 DIAGNOSIS — T8141XA Infection following a procedure, superficial incisional surgical site, initial encounter: Secondary | ICD-10-CM | POA: Diagnosis not present

## 2017-12-05 NOTE — H&P (View-Only) (Signed)
  Subjective:  Patient ID: Derrick [name], male    DOB: [birth year],  MRN: [id number]  Chief Complaint  Patient presents with  . Routine Post Op    i am not doing good on the right foot    84 y.o. male returns for wound care.  States that he has not been doing good on the right foot.  States the wound looks worse from previously.  Denies nausea vomiting fever chills  Objective:   Vitals:   12/04/17 1520  Temp: 97.8 F (36.6 C)   General AA&O x3. Normal mood and affect.  Vascular Foot warm to touch.  Neurologic Sensation grossly diminished.  Dermatologic (Wound) Wound Location: Right lateral forefoot Wound Measurement: 2.5 x 2.5 x 0.1 Wound Base: Mixed Granular/Fibrotic Peri-wound: Normal Exudate: None: wound tissue dry  Wound progress: decreased since last check.  Orthopedic: No pain to palpation either foot.   Assessment & Plan:  Patient was evaluated and treated and all questions answered.  Ulcer right lateral foot secondary to dehiscence from transmetatarsal application -Gently debrided with tissue nipper. Covered under global -Medihoney and dry sterile dressing applied -Due to the lack of progression healing will time for operative debridement with application of skin graft substitute.  Consent reviewed and signed by patient.  Discussed need for primary clearance prior to surgery.  Patient seeing primary care doctor next week.   No Follow-up on file.

## 2017-12-05 NOTE — Telephone Encounter (Signed)
Patient walked in with from from [facility] that is requesting clearance (?) but also included an H&P form. Patient saw. Dr. [name] yesterday.  Called this facility to determine what is needed to assist patient.  -- Patient needs cardiac clearance -- Patient needs H&P form completed - advised should come from PCP  Patient is having incision and draining of right foot with application of skin graft.  -- anesthesia: IV, local -- Dr. [name] did not specify if patient will need to hold ASA & Plavix  -- tentative surgery March 13  Fax or [place] (f) [phone] or [phone] (p) [phone]  Spoke with patient and informed him that his clearance will be addressed from a cardiac standpoint and he should ask him PCP to complete his general H&P form - states he has an appointment on Monday 3/4

## 2017-12-05 NOTE — Telephone Encounter (Signed)
Hi this is Asha, a nurse with [facility]. I came out to see Derrick [name] today for some wound care and he stated he would be having some more surgery on that foot. I wanted to know if the MD wanted to continue the orders or have new orders. If you could give me a call back at [phone]. I just need to know if I need to resume with current orders or wait until after surgery. Thank you. Bye.

## 2017-12-05 NOTE — Telephone Encounter (Signed)
Low risk. OK to stop plavix for the procedure. MCr

## 2017-12-05 NOTE — Telephone Encounter (Signed)
   [facility] Pre-operative Risk Assessment    Request for surgical clearance:  1. What type of surgery is being performed? Incision & drainage of right foot with application of skin graft   2. When is this surgery scheduled? Tentative 12/18/17   3. What type of clearance is required (medical clearance vs. Pharmacy clearance to hold med vs. Both)? Both   4. Are there any medications that need to be held prior to surgery and how long? Surgeon did not specify if meds needs to be held   5. Practice name and name of physician performing surgery? Dr. [name] @ [facility]  6. What is your office phone and fax number? (p) [phone]  (f) [phone] or [phone]  7. Anesthesia type (None, local, MAC, general) ? IV, local   Derrick [name] 12/05/2017, 11:10 AM  _________________________________________________________________   (provider comments below)

## 2017-12-05 NOTE — Progress Notes (Signed)
  Subjective:  Patient ID: Derrick [name], male    DOB: 1932-08-13,  MRN: [id number]  Chief Complaint  Patient presents with  . Routine Post Op    i am not doing good on the right foot    84 y.o. male returns for wound care.  States that he has not been doing good on the right foot.  States the wound looks worse from previously.  Denies nausea vomiting fever chills  Objective:   Vitals:   12/04/17 1520  Temp: 97.8 F (36.6 C)   General AA&O x3. Normal mood and affect.  Vascular Foot warm to touch.  Neurologic Sensation grossly diminished.  Dermatologic (Wound) Wound Location: Right lateral forefoot Wound Measurement: 2.5 x 2.5 x 0.1 Wound Base: Mixed Granular/Fibrotic Peri-wound: Normal Exudate: None: wound tissue dry  Wound progress: decreased since last check.  Orthopedic: No pain to palpation either foot.   Assessment & Plan:  Patient was evaluated and treated and all questions answered.  Ulcer right lateral foot secondary to dehiscence from transmetatarsal application -Gently debrided with tissue nipper. Covered under global -Medihoney and dry sterile dressing applied -Due to the lack of progression healing will time for operative debridement with application of skin graft substitute.  Consent reviewed and signed by patient.  Discussed need for primary clearance prior to surgery.  Patient seeing primary care doctor next week.   No Follow-up on file.

## 2017-12-05 NOTE — Telephone Encounter (Signed)
   Primary Cardiologist:Mihai Croitoru, MD  Chart reviewed as part of pre-operative protocol coverage.  Left voice mail to call back between pre-op hours.   He has had a transmetatarsal amputation of the R foot 08/14/2017. He was doing well last seen by Dr.Croitoru 09/30/17.  Dr. Loletha Grayer, please advised regarding ASA & plavix while wait for patient's call bacl and forward your response to P CV DIV PREOP.    [place], [place]  12/05/2017, 3:35 PM

## 2017-12-05 NOTE — Telephone Encounter (Signed)
I informed Asha - [facility] to continue with the Medihoney to the site until post op instructions.

## 2017-12-06 DIAGNOSIS — I129 Hypertensive chronic kidney disease with stage 1 through stage 4 chronic kidney disease, or unspecified chronic kidney disease: Secondary | ICD-10-CM | POA: Diagnosis not present

## 2017-12-06 DIAGNOSIS — J449 Chronic obstructive pulmonary disease, unspecified: Secondary | ICD-10-CM | POA: Diagnosis not present

## 2017-12-06 DIAGNOSIS — N183 Chronic kidney disease, stage 3 (moderate): Secondary | ICD-10-CM | POA: Diagnosis not present

## 2017-12-06 DIAGNOSIS — N400 Enlarged prostate without lower urinary tract symptoms: Secondary | ICD-10-CM | POA: Diagnosis not present

## 2017-12-06 DIAGNOSIS — T8141XA Infection following a procedure, superficial incisional surgical site, initial encounter: Secondary | ICD-10-CM | POA: Diagnosis not present

## 2017-12-06 DIAGNOSIS — E7800 Pure hypercholesterolemia, unspecified: Secondary | ICD-10-CM | POA: Diagnosis not present

## 2017-12-06 DIAGNOSIS — I739 Peripheral vascular disease, unspecified: Secondary | ICD-10-CM | POA: Diagnosis not present

## 2017-12-06 DIAGNOSIS — K219 Gastro-esophageal reflux disease without esophagitis: Secondary | ICD-10-CM | POA: Diagnosis not present

## 2017-12-06 DIAGNOSIS — I2510 Atherosclerotic heart disease of native coronary artery without angina pectoris: Secondary | ICD-10-CM | POA: Diagnosis not present

## 2017-12-06 DIAGNOSIS — Z452 Encounter for adjustment and management of vascular access device: Secondary | ICD-10-CM | POA: Diagnosis not present

## 2017-12-06 DIAGNOSIS — Z792 Long term (current) use of antibiotics: Secondary | ICD-10-CM | POA: Diagnosis not present

## 2017-12-06 DIAGNOSIS — Z7952 Long term (current) use of systemic steroids: Secondary | ICD-10-CM | POA: Diagnosis not present

## 2017-12-06 DIAGNOSIS — Z7982 Long term (current) use of aspirin: Secondary | ICD-10-CM | POA: Diagnosis not present

## 2017-12-06 NOTE — Telephone Encounter (Signed)
New message   Pt calling to check on the status of his surgical clearance. Please call

## 2017-12-06 NOTE — Telephone Encounter (Signed)
   Primary Cardiologist: Sanda Klein, MD  Chart reviewed as part of pre-operative protocol coverage. Patient was contacted 12/06/2017 in reference to pre-operative risk assessment for pending surgery as outlined below.  Derrick [name] was last seen on 09/10/2017 by Dr. [name].  Since that day, Derrick [name] has done well. Although he has occasional abdominal pain, he has not had it for several weeks. He denies any CP or SOB.   Therefore, based on ACC/AHA guidelines, the patient would be at acceptable risk for the planned procedure without further cardiovascular testing.   I will route this recommendation to the requesting party via Epic fax function and remove from pre-op pool.  Please call with questions. He will need to hold plavix for 5 days prior to the surgery and restart after the surgery as soon as possible. Ideally he needs to continue on the aspirin during the surgery. Let us know if it is absolutely necessary for him to come off the aspirin as well.   [place], [place] 12/06/2017, 6:42 PM

## 2017-12-09 ENCOUNTER — Telehealth: Payer: Self-pay | Admitting: *Deleted

## 2017-12-09 DIAGNOSIS — Z7982 Long term (current) use of aspirin: Secondary | ICD-10-CM | POA: Diagnosis not present

## 2017-12-09 DIAGNOSIS — J449 Chronic obstructive pulmonary disease, unspecified: Secondary | ICD-10-CM | POA: Diagnosis not present

## 2017-12-09 DIAGNOSIS — T8141XA Infection following a procedure, superficial incisional surgical site, initial encounter: Secondary | ICD-10-CM | POA: Diagnosis not present

## 2017-12-09 DIAGNOSIS — N183 Chronic kidney disease, stage 3 (moderate): Secondary | ICD-10-CM | POA: Diagnosis not present

## 2017-12-09 DIAGNOSIS — Z7952 Long term (current) use of systemic steroids: Secondary | ICD-10-CM | POA: Diagnosis not present

## 2017-12-09 DIAGNOSIS — I2510 Atherosclerotic heart disease of native coronary artery without angina pectoris: Secondary | ICD-10-CM | POA: Diagnosis not present

## 2017-12-09 DIAGNOSIS — N400 Enlarged prostate without lower urinary tract symptoms: Secondary | ICD-10-CM | POA: Diagnosis not present

## 2017-12-09 DIAGNOSIS — Z452 Encounter for adjustment and management of vascular access device: Secondary | ICD-10-CM | POA: Diagnosis not present

## 2017-12-09 DIAGNOSIS — K219 Gastro-esophageal reflux disease without esophagitis: Secondary | ICD-10-CM | POA: Diagnosis not present

## 2017-12-09 DIAGNOSIS — E7800 Pure hypercholesterolemia, unspecified: Secondary | ICD-10-CM | POA: Diagnosis not present

## 2017-12-09 DIAGNOSIS — I129 Hypertensive chronic kidney disease with stage 1 through stage 4 chronic kidney disease, or unspecified chronic kidney disease: Secondary | ICD-10-CM | POA: Diagnosis not present

## 2017-12-09 DIAGNOSIS — Z792 Long term (current) use of antibiotics: Secondary | ICD-10-CM | POA: Diagnosis not present

## 2017-12-09 DIAGNOSIS — I739 Peripheral vascular disease, unspecified: Secondary | ICD-10-CM | POA: Diagnosis not present

## 2017-12-09 NOTE — Telephone Encounter (Signed)
Faxed required IVR form, clinicals and demographics to [facility].

## 2017-12-10 DIAGNOSIS — L909 Atrophic disorder of skin, unspecified: Secondary | ICD-10-CM | POA: Diagnosis not present

## 2017-12-11 DIAGNOSIS — I129 Hypertensive chronic kidney disease with stage 1 through stage 4 chronic kidney disease, or unspecified chronic kidney disease: Secondary | ICD-10-CM | POA: Diagnosis not present

## 2017-12-11 DIAGNOSIS — N400 Enlarged prostate without lower urinary tract symptoms: Secondary | ICD-10-CM | POA: Diagnosis not present

## 2017-12-11 DIAGNOSIS — Z7952 Long term (current) use of systemic steroids: Secondary | ICD-10-CM | POA: Diagnosis not present

## 2017-12-11 DIAGNOSIS — N183 Chronic kidney disease, stage 3 (moderate): Secondary | ICD-10-CM | POA: Diagnosis not present

## 2017-12-11 DIAGNOSIS — E7800 Pure hypercholesterolemia, unspecified: Secondary | ICD-10-CM | POA: Diagnosis not present

## 2017-12-11 DIAGNOSIS — Z792 Long term (current) use of antibiotics: Secondary | ICD-10-CM | POA: Diagnosis not present

## 2017-12-11 DIAGNOSIS — K219 Gastro-esophageal reflux disease without esophagitis: Secondary | ICD-10-CM | POA: Diagnosis not present

## 2017-12-11 DIAGNOSIS — J449 Chronic obstructive pulmonary disease, unspecified: Secondary | ICD-10-CM | POA: Diagnosis not present

## 2017-12-11 DIAGNOSIS — I2510 Atherosclerotic heart disease of native coronary artery without angina pectoris: Secondary | ICD-10-CM | POA: Diagnosis not present

## 2017-12-11 DIAGNOSIS — T8141XA Infection following a procedure, superficial incisional surgical site, initial encounter: Secondary | ICD-10-CM | POA: Diagnosis not present

## 2017-12-11 DIAGNOSIS — Z452 Encounter for adjustment and management of vascular access device: Secondary | ICD-10-CM | POA: Diagnosis not present

## 2017-12-11 DIAGNOSIS — Z7982 Long term (current) use of aspirin: Secondary | ICD-10-CM | POA: Diagnosis not present

## 2017-12-11 DIAGNOSIS — I739 Peripheral vascular disease, unspecified: Secondary | ICD-10-CM | POA: Diagnosis not present

## 2017-12-13 DIAGNOSIS — T8141XA Infection following a procedure, superficial incisional surgical site, initial encounter: Secondary | ICD-10-CM | POA: Diagnosis not present

## 2017-12-13 DIAGNOSIS — N400 Enlarged prostate without lower urinary tract symptoms: Secondary | ICD-10-CM | POA: Diagnosis not present

## 2017-12-13 DIAGNOSIS — N183 Chronic kidney disease, stage 3 (moderate): Secondary | ICD-10-CM | POA: Diagnosis not present

## 2017-12-13 DIAGNOSIS — Z452 Encounter for adjustment and management of vascular access device: Secondary | ICD-10-CM | POA: Diagnosis not present

## 2017-12-13 DIAGNOSIS — I129 Hypertensive chronic kidney disease with stage 1 through stage 4 chronic kidney disease, or unspecified chronic kidney disease: Secondary | ICD-10-CM | POA: Diagnosis not present

## 2017-12-13 DIAGNOSIS — I739 Peripheral vascular disease, unspecified: Secondary | ICD-10-CM | POA: Diagnosis not present

## 2017-12-13 DIAGNOSIS — Z792 Long term (current) use of antibiotics: Secondary | ICD-10-CM | POA: Diagnosis not present

## 2017-12-13 DIAGNOSIS — I2510 Atherosclerotic heart disease of native coronary artery without angina pectoris: Secondary | ICD-10-CM | POA: Diagnosis not present

## 2017-12-13 DIAGNOSIS — Z7982 Long term (current) use of aspirin: Secondary | ICD-10-CM | POA: Diagnosis not present

## 2017-12-13 DIAGNOSIS — E7800 Pure hypercholesterolemia, unspecified: Secondary | ICD-10-CM | POA: Diagnosis not present

## 2017-12-13 DIAGNOSIS — K219 Gastro-esophageal reflux disease without esophagitis: Secondary | ICD-10-CM | POA: Diagnosis not present

## 2017-12-13 DIAGNOSIS — Z7952 Long term (current) use of systemic steroids: Secondary | ICD-10-CM | POA: Diagnosis not present

## 2017-12-13 DIAGNOSIS — J449 Chronic obstructive pulmonary disease, unspecified: Secondary | ICD-10-CM | POA: Diagnosis not present

## 2017-12-16 ENCOUNTER — Encounter (HOSPITAL_COMMUNITY): Payer: Self-pay | Admitting: *Deleted

## 2017-12-16 DIAGNOSIS — N400 Enlarged prostate without lower urinary tract symptoms: Secondary | ICD-10-CM | POA: Diagnosis not present

## 2017-12-16 DIAGNOSIS — T8141XA Infection following a procedure, superficial incisional surgical site, initial encounter: Secondary | ICD-10-CM | POA: Diagnosis not present

## 2017-12-16 DIAGNOSIS — Z7982 Long term (current) use of aspirin: Secondary | ICD-10-CM | POA: Diagnosis not present

## 2017-12-16 DIAGNOSIS — E7800 Pure hypercholesterolemia, unspecified: Secondary | ICD-10-CM | POA: Diagnosis not present

## 2017-12-16 DIAGNOSIS — K219 Gastro-esophageal reflux disease without esophagitis: Secondary | ICD-10-CM | POA: Diagnosis not present

## 2017-12-16 DIAGNOSIS — Z7952 Long term (current) use of systemic steroids: Secondary | ICD-10-CM | POA: Diagnosis not present

## 2017-12-16 DIAGNOSIS — N183 Chronic kidney disease, stage 3 (moderate): Secondary | ICD-10-CM | POA: Diagnosis not present

## 2017-12-16 DIAGNOSIS — I739 Peripheral vascular disease, unspecified: Secondary | ICD-10-CM | POA: Diagnosis not present

## 2017-12-16 DIAGNOSIS — I2510 Atherosclerotic heart disease of native coronary artery without angina pectoris: Secondary | ICD-10-CM | POA: Diagnosis not present

## 2017-12-16 DIAGNOSIS — Z792 Long term (current) use of antibiotics: Secondary | ICD-10-CM | POA: Diagnosis not present

## 2017-12-16 DIAGNOSIS — I129 Hypertensive chronic kidney disease with stage 1 through stage 4 chronic kidney disease, or unspecified chronic kidney disease: Secondary | ICD-10-CM | POA: Diagnosis not present

## 2017-12-16 DIAGNOSIS — J449 Chronic obstructive pulmonary disease, unspecified: Secondary | ICD-10-CM | POA: Diagnosis not present

## 2017-12-16 DIAGNOSIS — Z452 Encounter for adjustment and management of vascular access device: Secondary | ICD-10-CM | POA: Diagnosis not present

## 2017-12-16 NOTE — Progress Notes (Signed)
Patient denies chest pain or shortness of breath. Reports that he does not take plavix. I requested that patient get medication box. Patient went over all medications in box on the phone and he did not have any plavix. Staff note sent to cardiology.

## 2017-12-17 NOTE — Progress Notes (Signed)
Anesthesia Chart Review:  Pt is a same day work up.   Pt is an 84 year old male scheduled for irrigation and debridement R foot wound, epifix graft application on 2/72/5366 with [name], DPM.    - PCP is [name], MD who evaluated pt 12/10/17 and provided H&P for procedure - Cardiologist is Sanda Klein, MD. Last office visit 09/30/17.  Cleared for surgery by Almyra Deforest, PA 12/06/17.   PMH includes:  CAD (40-50% mid RCA, 40% proximal LAD by 11/19/11 cath), HTN, hyperlipidemia, anemia, COPD, CKD, RA, prostate cancer, renal cell carcinoma (S/P nephrectomy 2000). Former smoker. S/p amputation R 1st and 2nd toes 06/13/17; s/p R transmetatarsal amputation 08/14/17; s/p irrigation and debridement R foot 09/13/17.   Medications include: amlodipine, ASA 81 mg, Protonix, prednisone.   - Pt reports he is no longer taking plavix.  Pre-admission testing RN sent a staff message to Almyra Deforest, [place] with this info  Labs will be obtained DOS  CXR 09/19/17:  - No active disease.  - Aortic atherosclerosis.  - Right arm PICC tip SVC 3 cm above the right atrium.  EKG 09/30/17:  Sinus rhythm with PACs.  Nonspecific ST and T wave abnormality  Echo 09/20/17:  - Left ventricle: The cavity size was normal. Wall thickness was normal. Systolic function was normal. The estimated ejection fraction was in the range of 55% to 60%. Wall motion was normal; there were no regional wall motion abnormalities. Doppler parameters are consistent with abnormal left ventricular relaxation (grade 1 diastolic dysfunction). - Aortic valve: Trileaflet; mildly thickened, mildly calcified leaflets. - Mitral valve: There was mild regurgitation.  Holter monitor 12/04/16:  - Abnormal 24-hour Holter monitor with frequent PACs, frequent PVCs, occasional bursts of nonsustained atrial tachycardia and a single 50-beat run of wide-complex tachycardia, most likely representing ventricular tachycardia. There was no diary of symptoms  submitted.  Nuclear stress test 03/22/16:   The left ventricular ejection fraction is normal (55-65%).  Nuclear stress EF: 61%.  There was no ST segment deviation noted during stress.  No T wave inversion was noted during stress.  The study is normal.  This is a low risk study.  Cardiac cath 11/19/11:  - RCA: Mid focal 40-50% lesion just prior to marginal branch. PDA small caliber and there is a small posterior lateral system. Possible 20% lesion just before the bifurcation. - LM: Normal - LAD: Gives rise to large septal trunk followed by to diagonal branches. Between the septal trunk in the first diagonal branch there is concentric tubular 40% calcified lesion involving ostium of D1 with 20% lesion in this vessel. - CX: Large caliber vessel gives rise to moderate caliber high/proximal OM and relatively small AV groove artery. Terminates in bifurcating OM which eventually resulted OM2, OM3 and OM4. Angiographically normal.  If labs acceptable day of surgery, I anticipate pt can proceed with surgery as scheduled.   Willeen Cass, FNP-BC [facility] Short Stay Surgical Center/Anesthesiology Phone: [phone] 12/17/2017 10:40 AM

## 2017-12-18 ENCOUNTER — Ambulatory Visit (HOSPITAL_COMMUNITY)
Admission: RE | Admit: 2017-12-18 | Discharge: 2017-12-18 | Disposition: A | Discharge status: Home / Self Care | Payer: Medicare Other | Source: Ambulatory Visit | Attending: Podiatry | Admitting: Podiatry

## 2017-12-18 ENCOUNTER — Ambulatory Visit (HOSPITAL_COMMUNITY): Payer: Medicare Other | Admitting: Emergency Medicine

## 2017-12-18 ENCOUNTER — Encounter (HOSPITAL_COMMUNITY)
Admission: RE | Disposition: A | Discharge status: Home / Self Care | Payer: Self-pay | Source: Ambulatory Visit | Attending: Podiatry

## 2017-12-18 ENCOUNTER — Encounter (HOSPITAL_COMMUNITY): Payer: Self-pay | Admitting: Anesthesiology

## 2017-12-18 DIAGNOSIS — Z89439 Acquired absence of unspecified foot: Secondary | ICD-10-CM | POA: Diagnosis present

## 2017-12-18 DIAGNOSIS — I129 Hypertensive chronic kidney disease with stage 1 through stage 4 chronic kidney disease, or unspecified chronic kidney disease: Secondary | ICD-10-CM | POA: Diagnosis not present

## 2017-12-18 DIAGNOSIS — I471 Supraventricular tachycardia: Secondary | ICD-10-CM | POA: Insufficient documentation

## 2017-12-18 DIAGNOSIS — I2510 Atherosclerotic heart disease of native coronary artery without angina pectoris: Secondary | ICD-10-CM | POA: Diagnosis not present

## 2017-12-18 DIAGNOSIS — Z888 Allergy status to other drugs, medicaments and biological substances status: Secondary | ICD-10-CM | POA: Insufficient documentation

## 2017-12-18 DIAGNOSIS — N183 Chronic kidney disease, stage 3 (moderate): Secondary | ICD-10-CM | POA: Diagnosis not present

## 2017-12-18 DIAGNOSIS — Z89421 Acquired absence of other right toe(s): Secondary | ICD-10-CM | POA: Insufficient documentation

## 2017-12-18 DIAGNOSIS — F418 Other specified anxiety disorders: Secondary | ICD-10-CM | POA: Insufficient documentation

## 2017-12-18 DIAGNOSIS — L89899 Pressure ulcer of other site, unspecified stage: Secondary | ICD-10-CM | POA: Insufficient documentation

## 2017-12-18 DIAGNOSIS — M1990 Unspecified osteoarthritis, unspecified site: Secondary | ICD-10-CM | POA: Insufficient documentation

## 2017-12-18 DIAGNOSIS — L97511 Non-pressure chronic ulcer of other part of right foot limited to breakdown of skin: Secondary | ICD-10-CM

## 2017-12-18 DIAGNOSIS — Z8249 Family history of ischemic heart disease and other diseases of the circulatory system: Secondary | ICD-10-CM | POA: Diagnosis not present

## 2017-12-18 DIAGNOSIS — I10 Essential (primary) hypertension: Secondary | ICD-10-CM | POA: Insufficient documentation

## 2017-12-18 DIAGNOSIS — K219 Gastro-esophageal reflux disease without esophagitis: Secondary | ICD-10-CM | POA: Diagnosis not present

## 2017-12-18 DIAGNOSIS — J449 Chronic obstructive pulmonary disease, unspecified: Secondary | ICD-10-CM | POA: Diagnosis not present

## 2017-12-18 DIAGNOSIS — N400 Enlarged prostate without lower urinary tract symptoms: Secondary | ICD-10-CM | POA: Diagnosis not present

## 2017-12-18 DIAGNOSIS — L97519 Non-pressure chronic ulcer of other part of right foot with unspecified severity: Secondary | ICD-10-CM | POA: Diagnosis not present

## 2017-12-18 DIAGNOSIS — Z85528 Personal history of other malignant neoplasm of kidney: Secondary | ICD-10-CM | POA: Insufficient documentation

## 2017-12-18 HISTORY — PX: GRAFT APPLICATION: SHX6696

## 2017-12-18 HISTORY — PX: INCISION AND DRAINAGE OF WOUND: SHX1803

## 2017-12-18 LAB — BASIC METABOLIC PANEL
ANION GAP: 9 (ref 5–15)
BUN: 12 mg/dL (ref 6–20)
CALCIUM: 9.3 mg/dL (ref 8.9–10.3)
CO2: 24 mmol/L (ref 22–32)
Chloride: 104 mmol/L (ref 101–111)
Creatinine, Ser: 1.21 mg/dL (ref 0.61–1.24)
GFR calc Af Amer: >60 mL/min (ref >60)
GFR, EST NON AFRICAN AMERICAN: 54 mL/min — AB (ref >60)
Glucose, Bld: 111 mg/dL — ABNORMAL HIGH (ref 65–99)
POTASSIUM: 3.9 mmol/L (ref 3.5–5.1)
SODIUM: 137 mmol/L (ref 135–145)

## 2017-12-18 LAB — CBC
HEMATOCRIT: 36.3 % — AB (ref 39.0–52.0)
Hemoglobin: 11.4 g/dL — ABNORMAL LOW (ref 13.0–17.0)
MCH: 26.1 pg (ref 26.0–34.0)
MCHC: 31.4 g/dL (ref 30.0–36.0)
MCV: 83.1 fL (ref 78.0–100.0)
Platelets: 180 10*3/uL (ref 150–400)
RBC: 4.37 MIL/uL (ref 4.22–5.81)
RDW: 15.1 % (ref 11.5–15.5)
WBC: 9.6 10*3/uL (ref 4.0–10.5)

## 2017-12-18 SURGERY — IRRIGATION AND DEBRIDEMENT WOUND
Anesthesia: Monitor Anesthesia Care | Site: Foot | Laterality: Right

## 2017-12-18 MED ORDER — FENTANYL CITRATE (PF) 250 MCG/5ML IJ SOLN
INTRAMUSCULAR | Status: AC
  Filled 2017-12-18: qty 5

## 2017-12-18 MED ORDER — SODIUM CHLORIDE 0.9 % IV SOLN
INTRAVENOUS | Status: DC
  Administered 2017-12-18: 10:00:00 via INTRAVENOUS

## 2017-12-18 MED ORDER — VANCOMYCIN HCL 1000 MG IV SOLR
INTRAVENOUS | Status: DC | PRN
  Administered 2017-12-18: 1000 mg via TOPICAL

## 2017-12-18 MED ORDER — ONDANSETRON HCL 4 MG/2ML IJ SOLN
INTRAMUSCULAR | Status: DC | PRN
  Administered 2017-12-18: 4 mg via INTRAVENOUS

## 2017-12-18 MED ORDER — BUPIVACAINE HCL (PF) 0.5 % IJ SOLN
INTRAMUSCULAR | Status: AC
  Filled 2017-12-18: qty 30

## 2017-12-18 MED ORDER — HYDROMORPHONE HCL 1 MG/ML IJ SOLN: 0.2500 mg | INTRAMUSCULAR | Status: DC | PRN

## 2017-12-18 MED ORDER — ONDANSETRON HCL 4 MG/2ML IJ SOLN
INTRAMUSCULAR | Status: AC
  Filled 2017-12-18: qty 2

## 2017-12-18 MED ORDER — SODIUM CHLORIDE 0.9 % IR SOLN
Status: DC | PRN
  Administered 2017-12-18: 1000 mL

## 2017-12-18 MED ORDER — LIDOCAINE 2% (20 MG/ML) 5 ML SYRINGE
INTRAMUSCULAR | Status: DC | PRN
  Administered 2017-12-18: 40 mg via INTRAVENOUS

## 2017-12-18 MED ORDER — DEXAMETHASONE SODIUM PHOSPHATE 10 MG/ML IJ SOLN
INTRAMUSCULAR | Status: AC
  Filled 2017-12-18: qty 1

## 2017-12-18 MED ORDER — FENTANYL CITRATE (PF) 100 MCG/2ML IJ SOLN
INTRAMUSCULAR | Status: DC | PRN
  Administered 2017-12-18: 50 ug via INTRAVENOUS

## 2017-12-18 MED ORDER — MEPERIDINE HCL 50 MG/ML IJ SOLN: 6.2500 mg | INTRAMUSCULAR | Status: DC | PRN

## 2017-12-18 MED ORDER — LIDOCAINE HCL (CARDIAC) 20 MG/ML IV SOLN
INTRAVENOUS | Status: AC
  Filled 2017-12-18: qty 5

## 2017-12-18 MED ORDER — LACTATED RINGERS IV SOLN
INTRAVENOUS | Status: DC | PRN
  Administered 2017-12-18: 12:00:00 via INTRAVENOUS

## 2017-12-18 MED ORDER — BUPIVACAINE HCL (PF) 0.5 % IJ SOLN
INTRAMUSCULAR | Status: DC | PRN
  Administered 2017-12-18: 30 mL

## 2017-12-18 MED ORDER — CEFAZOLIN SODIUM-DEXTROSE 2-4 GM/100ML-% IV SOLN
2.0000 g | INTRAVENOUS | Status: AC
  Administered 2017-12-18: 2 g via INTRAVENOUS
  Filled 2017-12-18: qty 100

## 2017-12-18 MED ORDER — 0.9 % SODIUM CHLORIDE (POUR BTL) OPTIME
TOPICAL | Status: DC | PRN
  Administered 2017-12-18: 1000 mL

## 2017-12-18 MED ORDER — HYDROMORPHONE HCL 2 MG PO TABS: 1.0000 mg | ORAL_TABLET | Freq: Four times a day (QID) | ORAL | 0 refills | Status: DC | PRN

## 2017-12-18 MED ORDER — PROMETHAZINE HCL 25 MG/ML IJ SOLN: 6.2500 mg | INTRAMUSCULAR | Status: DC | PRN

## 2017-12-18 MED ORDER — CEPHALEXIN 500 MG PO CAPS: 500.0000 mg | ORAL_CAPSULE | Freq: Two times a day (BID) | ORAL | 0 refills | Status: DC

## 2017-12-18 MED ORDER — MIDAZOLAM HCL 2 MG/2ML IJ SOLN: 0.5000 mg | Freq: Once | INTRAMUSCULAR | Status: DC | PRN

## 2017-12-18 MED ORDER — HEPARIN SODIUM (PORCINE) 1000 UNIT/ML IJ SOLN
INTRAMUSCULAR | Status: AC
  Filled 2017-12-18: qty 1

## 2017-12-18 MED ORDER — VANCOMYCIN HCL 1000 MG IV SOLR
INTRAVENOUS | Status: AC
  Filled 2017-12-18: qty 1000

## 2017-12-18 MED ORDER — PROPOFOL 10 MG/ML IV BOLUS
INTRAVENOUS | Status: DC | PRN
  Administered 2017-12-18: 40 mg via INTRAVENOUS
  Administered 2017-12-18 (×3): 20 mg via INTRAVENOUS

## 2017-12-18 SURGICAL SUPPLY — 50 items
APL SKNCLS STERI-STRIP NONHPOA (GAUZE/BANDAGES/DRESSINGS) ×1
BANDAGE ACE 4X5 VEL STRL LF (GAUZE/BANDAGES/DRESSINGS) ×2 IMPLANT
BENZOIN TINCTURE PRP APPL 2/3 (GAUZE/BANDAGES/DRESSINGS) ×2 IMPLANT
BNDG CMPR 9X4 STRL LF SNTH (GAUZE/BANDAGES/DRESSINGS)
BNDG ESMARK 4X9 LF (GAUZE/BANDAGES/DRESSINGS) IMPLANT
BNDG GAUZE ELAST 4 BULKY (GAUZE/BANDAGES/DRESSINGS) ×2 IMPLANT
CHLORAPREP W/TINT 26ML (MISCELLANEOUS) ×1 IMPLANT
CLOSURE WOUND 1/2 X4 (GAUZE/BANDAGES/DRESSINGS) ×1
COVER SURGICAL LIGHT HANDLE (MISCELLANEOUS) ×3 IMPLANT
CUFF TOURNIQUET SINGLE 18IN (TOURNIQUET CUFF) IMPLANT
CUFF TOURNIQUET SINGLE 34IN LL (TOURNIQUET CUFF) IMPLANT
DRAPE U-SHAPE 47X51 STRL (DRAPES) ×3 IMPLANT
DRSG EMULSION OIL 3X3 NADH (GAUZE/BANDAGES/DRESSINGS) ×2 IMPLANT
ELECT CAUTERY BLADE 6.4 (BLADE) ×3 IMPLANT
ELECT REM PT RETURN 9FT ADLT (ELECTROSURGICAL) ×3
ELECTRODE REM PT RTRN 9FT ADLT (ELECTROSURGICAL) ×1 IMPLANT
GAUZE SPONGE 4X4 12PLY STRL (GAUZE/BANDAGES/DRESSINGS) IMPLANT
GAUZE SPONGE 4X4 12PLY STRL LF (GAUZE/BANDAGES/DRESSINGS) ×2 IMPLANT
GLOVE BIO SURGEON STRL SZ7.5 (GLOVE) ×3 IMPLANT
GLOVE BIOGEL PI IND STRL 8 (GLOVE) ×1 IMPLANT
GLOVE BIOGEL PI INDICATOR 8 (GLOVE) ×6
GLOVE SURG SS PI 7.0 STRL IVOR (GLOVE) ×2 IMPLANT
GOWN STRL REUS W/ TWL LRG LVL3 (GOWN DISPOSABLE) ×1 IMPLANT
GOWN STRL REUS W/ TWL XL LVL3 (GOWN DISPOSABLE) ×1 IMPLANT
GOWN STRL REUS W/TWL LRG LVL3 (GOWN DISPOSABLE) ×3
GOWN STRL REUS W/TWL XL LVL3 (GOWN DISPOSABLE) ×3
GRAFT AMNIOFIX 2X6 (Graft) ×2 IMPLANT
KIT BASIN OR (CUSTOM PROCEDURE TRAY) ×3 IMPLANT
KIT ROOM TURNOVER OR (KITS) ×3 IMPLANT
MANIFOLD NEPTUNE II (INSTRUMENTS) ×1 IMPLANT
NDL BIOPSY JAMSHIDI 8X6 (NEEDLE) IMPLANT
NDL HYPO 25GX1X1/2 BEV (NEEDLE) IMPLANT
NEEDLE BIOPSY JAMSHIDI 8X6 (NEEDLE) IMPLANT
NEEDLE HYPO 25GX1X1/2 BEV (NEEDLE) ×3 IMPLANT
NS IRRIG 1000ML POUR BTL (IV SOLUTION) ×3 IMPLANT
PACK ORTHO EXTREMITY (CUSTOM PROCEDURE TRAY) ×3 IMPLANT
PAD ARMBOARD 7.5X6 YLW CONV (MISCELLANEOUS) ×6 IMPLANT
PROBE DEBRIDE SONICVAC MISONIX (TIP) ×2 IMPLANT
SCRUB BETADINE 4OZ XXX (MISCELLANEOUS) ×3 IMPLANT
SET CYSTO W/LG BORE CLAMP LF (SET/KITS/TRAYS/PACK) ×1 IMPLANT
SOL PREP POV-IOD 4OZ 10% (MISCELLANEOUS) ×1 IMPLANT
SOLUTION BETADINE 4OZ (MISCELLANEOUS) ×2 IMPLANT
STRIP CLOSURE SKIN 1/2X4 (GAUZE/BANDAGES/DRESSINGS) ×1 IMPLANT
SYR CONTROL 10ML LL (SYRINGE) ×2 IMPLANT
TOWEL GREEN STERILE (TOWEL DISPOSABLE) ×3 IMPLANT
TOWEL GREEN STERILE FF (TOWEL DISPOSABLE) ×3 IMPLANT
TUBE CONNECTING 12'X1/4 (SUCTIONS) ×1
TUBE CONNECTING 12X1/4 (SUCTIONS) ×2 IMPLANT
TUBE IRRIGATION SET MISONIX (TUBING) ×2 IMPLANT
YANKAUER SUCT BULB TIP NO VENT (SUCTIONS) ×3 IMPLANT

## 2017-12-18 NOTE — Anesthesia Preprocedure Evaluation (Addendum)
Anesthesia Evaluation  Patient identified by MRN, date of birth, ID band Patient awake    Reviewed: Allergy & Precautions, NPO status , Patient's Chart, lab work & pertinent test results  History of Anesthesia Complications Negative for: history of anesthetic complications  Airway Mallampati: I  TM Distance: >3 FB Neck ROM: Full    Dental  (+) Edentulous Upper, Edentulous Lower   Pulmonary COPD, former smoker (quit 1980),    breath sounds clear to auscultation       Cardiovascular hypertension, Pt. on medications (-) angina Rhythm:Regular Rate:Normal  '18 ECHO: EF 55-60%, mild MR '17 Stress: no ischemia, normal LVF '13 Cath: nonobstructive coronary artery disease (40-50% mid RCA, 40% proximal LAD).    Neuro/Psych Anxiety Depression negative neurological ROS     GI/Hepatic Neg liver ROS, GERD  Controlled,  Endo/Other  negative endocrine ROS  Renal/GU Renal InsufficiencyRenal disease     Musculoskeletal  (+) Arthritis , Rheumatoid disorders,    Abdominal   Peds  Hematology negative hematology ROS (+)   Anesthesia Other Findings   Reproductive/Obstetrics                            Anesthesia Physical Anesthesia Plan  ASA: II  Anesthesia Plan: MAC   Post-op Pain Management:    Induction:   PONV Risk Score and Plan: 1 and Ondansetron and Treatment may vary due to age or medical condition  Airway Management Planned: Natural Airway and Simple Face Mask  Additional Equipment:   Intra-op Plan:   Post-operative Plan:   Informed Consent: I have reviewed the patients History and Physical, chart, labs and discussed the procedure including the risks, benefits and alternatives for the proposed anesthesia with the patient or authorized representative who has indicated his/her understanding and acceptance.     Plan Discussed with: CRNA and Surgeon  Anesthesia Plan Comments: (Plan  routine monitors, MAC)        Anesthesia Quick Evaluation

## 2017-12-18 NOTE — Brief Op Note (Signed)
12/18/2017  1:01 PM  PATIENT:  Derrick [name]  84 y.o. male  PRE-OPERATIVE DIAGNOSIS:  ULCER RIGHT FOOT  POST-OPERATIVE DIAGNOSIS:  ULCER RIGHT FOOT  PROCEDURE:  Procedure(s): IRRIGATION AND DEBRIDEMENT WOUND OR RIGHT FOOT (Right) EPIFIX GRAFT APPLICATION OF RIGHT FOOT (Right)  SURGEON:  Surgeon(s) and Role:    * [name], DPM - Primary  PHYSICIAN ASSISTANT:   ASSISTANTS: none   ANESTHESIA:   local and MAC  EBL:  10 mL   BLOOD ADMINISTERED:none  DRAINS: none   LOCAL MEDICATIONS USED:  MARCAINE 0.5%   and Amount: 10 ml  SPECIMEN:  No Specimen  DISPOSITION OF SPECIMEN:  N/A  COUNTS:  YES  TOURNIQUET:  * No tourniquets in log *  DICTATION: .Note written in EPIC  PLAN OF CARE: Discharge to home after PACU  PATIENT DISPOSITION:  PACU - hemodynamically stable.   Delay start of Pharmacological VTE agent (>24hrs) due to surgical blood loss or risk of bleeding: not applicable

## 2017-12-18 NOTE — Interval H&P Note (Signed)
History and Physical Interval Note:  12/18/2017 11:56 AM  Derrick [name]  has presented today for surgery, with the diagnosis of ULCER RIGHT FOOT  The various methods of treatment have been discussed with the patient and family. After consideration of risks, benefits and other options for treatment, the patient has consented to  Procedure(s): IRRIGATION AND DEBRIDEMENT WOUND OR RIGHT FOOT (Right) EPIFIX GRAFT APPLICATION OF RIGHT FOOT (Right) as a surgical intervention .  The patient's history has been reviewed, patient examined, no change in status, stable for surgery.  I have reviewed the patient's chart and labs.  Questions were answered to the patient's satisfaction.     [name]

## 2017-12-18 NOTE — H&P (Signed)
Anesthesia H&P Update: History and Physical Exam reviewed; patient is OK for planned anesthetic and procedure. ? ?

## 2017-12-18 NOTE — Anesthesia Postprocedure Evaluation (Signed)
Anesthesia Post Note  Patient: Derrick [name]  Procedure(s) Performed: IRRIGATION AND DEBRIDEMENT WOUND ON RIGHT FOOT (Right Foot) EPIFIX GRAFT APPLICATION OF RIGHT FOOT (Right Foot)     Patient location during evaluation: PACU Anesthesia Type: MAC Level of consciousness: awake and alert, patient cooperative and oriented Pain management: pain level controlled Vital Signs Assessment: post-procedure vital signs reviewed and stable Respiratory status: spontaneous breathing, nonlabored ventilation and respiratory function stable Cardiovascular status: stable and blood pressure returned to baseline Postop Assessment: no apparent nausea or vomiting Anesthetic complications: no    Last Vitals:  Vitals:   12/18/17 1350 12/18/17 1354  BP: 123/76 110/75  Pulse: 64 61  Resp: 16 16  Temp: 36.6 C   SpO2: 98% 95%    Last Pain:  Vitals:   12/18/17 1350  TempSrc:   PainSc: 0-No pain                 Vickki Igou,E. Kenitha Glendinning

## 2017-12-18 NOTE — Transfer of Care (Signed)
Immediate Anesthesia Transfer of Care Note  Patient: Derrick [name]  Procedure(s) Performed: IRRIGATION AND DEBRIDEMENT WOUND OR RIGHT FOOT (Right ) EPIFIX GRAFT APPLICATION OF RIGHT FOOT (Right )  Patient Location: PACU  Anesthesia Type:MAC  Level of Consciousness: awake, alert , oriented and patient cooperative  Airway & Oxygen Therapy: Patient Spontanous Breathing and Patient connected to nasal cannula oxygen  Post-op Assessment: Report given to RN and Post -op Vital signs reviewed and stable  Post vital signs: Reviewed and stable  Last Vitals:  Vitals:   12/18/17 1010  BP: 134/79  Pulse: 80  Resp: 20  Temp: 36.7 C  SpO2: 100%    Last Pain:  Vitals:   12/18/17 1010  TempSrc: Oral      Patients Stated Pain Goal: 3 (36/01/65 8006)  Complications: No apparent anesthesia complications

## 2017-12-18 NOTE — Op Note (Signed)
Patient Name: Derrick [name] DOB: 09/09/1932  MRN: [id number]  Date of Service: 12/18/17   Surgeon: Dr. [name], DPM Assistants: None Pre-operative Diagnosis: Ulcer right lateral foot limited to breakdown the skin Post-operative Diagnosis: Same Procedures:             1) Debridement and irrigation of right foot wound  2) Application skin graft substitute to the right foot Pathology/Specimens:             1) nonr Anesthesia: MAC local with 10 cc half percent Marcaine plain Hemostasis: Anatomic Estimated Blood Loss: 2 ml Materials: None Medications: 0.5g of vancomycin powder Complications: None  Indications for Procedure: This is a 84 year old male with history of transmetatarsal amputation to the right foot.  He has had a wound dehiscence laterally that has been slow to heal.  It was discussed the patient he would benefit from debridement irrigation with application of skin graft substitute to promote healing of the wound.  Procedure in Detail: Patient was identified in pre-operative holding area. Formal consent was signed and the right lower extremity was marked. Patient was brought back to the operating room and remained on a stretcher.  Anesthesia was induced and the right lower extremity was prepped and draped in usual sterile fashion; timeout was taken prior to procedure start.  Attention was then directed to the right lateral foot where the wound measured 2 cm x 2.5 cm x 0.2 cm.  The wound was then thoroughly sharply and excisionally debrided with a Misonix ultrasonic debrider.  The wound was debrided to healthy viable tissue.  A small amount of vancomycin powder was applied topically to the wound bed. An Amniofix graft was applied to the wound base and adhered with adaptic and steri strips. The foot was then dressed with bolster gauze, 4x4, kerlix, and ACE bandage. Patient tolerated the procedure well.  Disposition: Following a period of post-operative monitoring, patient  will be transferred back home.

## 2017-12-18 NOTE — Anesthesia Procedure Notes (Signed)
Procedure Name: MAC Date/Time: 12/18/2017 12:30 PM Performed by: Renato Shin, CRNA Pre-anesthesia Checklist: Patient identified, Emergency Drugs available, Suction available and Patient being monitored Patient Re-evaluated:Patient Re-evaluated prior to induction Oxygen Delivery Method: Nasal cannula Preoxygenation: Pre-oxygenation with 100% oxygen Induction Type: IV induction Placement Confirmation: positive ETCO2,  CO2 detector and breath sounds checked- equal and bilateral Dental Injury: Teeth and Oropharynx as per pre-operative assessment

## 2017-12-19 ENCOUNTER — Telehealth: Payer: Self-pay

## 2017-12-19 NOTE — Telephone Encounter (Signed)
Spoke to patient's wife. She reports that he is doing great, up and walking around. They will come in tomorrow, Friday 12/20/17 for his first post op visit.

## 2017-12-20 ENCOUNTER — Ambulatory Visit (INDEPENDENT_AMBULATORY_CARE_PROVIDER_SITE_OTHER): Payer: Medicare Other | Admitting: Podiatry

## 2017-12-20 DIAGNOSIS — L97511 Non-pressure chronic ulcer of other part of right foot limited to breakdown of skin: Secondary | ICD-10-CM | POA: Diagnosis not present

## 2017-12-23 ENCOUNTER — Telehealth: Payer: Self-pay | Admitting: Podiatry

## 2017-12-23 ENCOUNTER — Encounter (HOSPITAL_COMMUNITY): Payer: Self-pay | Admitting: Podiatry

## 2017-12-23 NOTE — Progress Notes (Signed)
DOS: 12/18/2017  Right Foot Debridement and Irrigation, Application skin graft substitute

## 2017-12-23 NOTE — Telephone Encounter (Signed)
I spoke with Asha - [facility] and she states pt has declined [place] visit today. I told her that was fine, that I was going to instruct her that pt was to keep the surgical dressing in place until seen on 12/27/2017.

## 2017-12-23 NOTE — Telephone Encounter (Signed)
Hi, my name is Rudi Rummage a Marine scientist with [facility]. I have Mr. Bonet on my schedule today. He recently had a debridement surgery on Wednesday with your office. I was wondering if he was going to have any new orders before I go out to see him. Right now he is getting the medihoney on that area. You can give me a call back at [phone]. Thanks. Bye.

## 2017-12-25 ENCOUNTER — Telehealth: Payer: Self-pay | Admitting: Podiatry

## 2017-12-25 NOTE — Telephone Encounter (Signed)
I spoke with pt and he states someone answered his question, he states no one changed his dressing since the surgery, but he found out that was all right. I told pt in normal situations surgery dressings stay on 7-10 days to the 1st post op visit. Pt states understanding.

## 2017-12-25 NOTE — Telephone Encounter (Signed)
I'm trying to find out if I got the nurse line or if I can call the doctor about my foot that I've been having trouble with. I haven't been able to get an answer from anyone this week so I thought I'd try you. My phone number is [phone]. Thank you.

## 2017-12-27 ENCOUNTER — Ambulatory Visit (INDEPENDENT_AMBULATORY_CARE_PROVIDER_SITE_OTHER): Payer: Medicare Other | Admitting: Podiatry

## 2017-12-27 DIAGNOSIS — T8131XD Disruption of external operation (surgical) wound, not elsewhere classified, subsequent encounter: Secondary | ICD-10-CM

## 2017-12-31 NOTE — Progress Notes (Signed)
  Subjective:  Patient ID: Derrick [name], male    DOB: 1932/02/19,  MRN: [id number]  No chief complaint on file.  84 y.o. male returns for wound care. Underwent D&I with skin graft substitute application 9/82/64. Denies pain.  Objective:   There were no vitals filed for this visit. General AA&O x3. Normal mood and affect.  Vascular Foot warm to touch.  Neurologic Sensation grossly diminished.  Dermatologic (Wound) Wound Location: Right lateral forefoot Wound Measurement: 2x2x0.1 Wound Base: Mixed Granular/Fibrotic Peri-wound: Normal Exudate: None: wound tissue dry  Wound progress: improved since last check.  Orthopedic: No pain to palpation either foot.   Assessment & Plan:  Patient was evaluated and treated and all questions answered.  Ulcer right lateral foot secondary to dehiscence from transmetatarsal application -Improving. Dressed with medihoney, DSD. -Would consider possible reapplication of skin graft substitute in office.  Return in about 1 week (around 01/03/2018) for Wound Care.

## 2018-01-01 ENCOUNTER — Telehealth: Payer: Self-pay | Admitting: Podiatry

## 2018-01-01 NOTE — Telephone Encounter (Signed)
I informed Asha, RN - [facility] of Dr. Eleanora Neighbor orders to continue the medihoney to the right foot surgical site.

## 2018-01-01 NOTE — Telephone Encounter (Signed)
email question to T. Muncey - [facility] asking if I needed to pre-cert for graft to be placed in-office, when original graft had been place in the hospital during surgery.

## 2018-01-01 NOTE — Telephone Encounter (Signed)
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 I have him on my schedule today but I never received wound care orders after he had that debridement done a couple of weeks ago. Do you still want [place] to see him and if so what should I be doing to that area? If you would please give me a call back at [phone]. Thanks. Bye.

## 2018-01-02 ENCOUNTER — Telehealth: Payer: Self-pay | Admitting: *Deleted

## 2018-01-02 DIAGNOSIS — Z452 Encounter for adjustment and management of vascular access device: Secondary | ICD-10-CM | POA: Diagnosis not present

## 2018-01-02 DIAGNOSIS — N400 Enlarged prostate without lower urinary tract symptoms: Secondary | ICD-10-CM | POA: Diagnosis not present

## 2018-01-02 DIAGNOSIS — Z792 Long term (current) use of antibiotics: Secondary | ICD-10-CM | POA: Diagnosis not present

## 2018-01-02 DIAGNOSIS — I2510 Atherosclerotic heart disease of native coronary artery without angina pectoris: Secondary | ICD-10-CM | POA: Diagnosis not present

## 2018-01-02 DIAGNOSIS — E7800 Pure hypercholesterolemia, unspecified: Secondary | ICD-10-CM | POA: Diagnosis not present

## 2018-01-02 DIAGNOSIS — J449 Chronic obstructive pulmonary disease, unspecified: Secondary | ICD-10-CM | POA: Diagnosis not present

## 2018-01-02 DIAGNOSIS — I739 Peripheral vascular disease, unspecified: Secondary | ICD-10-CM | POA: Diagnosis not present

## 2018-01-02 DIAGNOSIS — T8141XA Infection following a procedure, superficial incisional surgical site, initial encounter: Secondary | ICD-10-CM | POA: Diagnosis not present

## 2018-01-02 DIAGNOSIS — K219 Gastro-esophageal reflux disease without esophagitis: Secondary | ICD-10-CM | POA: Diagnosis not present

## 2018-01-02 DIAGNOSIS — Z7982 Long term (current) use of aspirin: Secondary | ICD-10-CM | POA: Diagnosis not present

## 2018-01-02 DIAGNOSIS — I129 Hypertensive chronic kidney disease with stage 1 through stage 4 chronic kidney disease, or unspecified chronic kidney disease: Secondary | ICD-10-CM | POA: Diagnosis not present

## 2018-01-02 DIAGNOSIS — N183 Chronic kidney disease, stage 3 (moderate): Secondary | ICD-10-CM | POA: Diagnosis not present

## 2018-01-02 DIAGNOSIS — Z7952 Long term (current) use of systemic steroids: Secondary | ICD-10-CM | POA: Diagnosis not present

## 2018-01-02 NOTE — Telephone Encounter (Signed)
Derrick [name] - [facility] states pt's insurance will not cover the graft and he will speak with Dr. [name].

## 2018-01-03 ENCOUNTER — Ambulatory Visit (INDEPENDENT_AMBULATORY_CARE_PROVIDER_SITE_OTHER): Payer: Medicare Other | Admitting: Podiatry

## 2018-01-03 DIAGNOSIS — L97511 Non-pressure chronic ulcer of other part of right foot limited to breakdown of skin: Secondary | ICD-10-CM | POA: Diagnosis not present

## 2018-01-03 DIAGNOSIS — T8130XA Disruption of wound, unspecified, initial encounter: Secondary | ICD-10-CM

## 2018-01-03 DIAGNOSIS — T8131XD Disruption of external operation (surgical) wound, not elsewhere classified, subsequent encounter: Secondary | ICD-10-CM | POA: Diagnosis not present

## 2018-01-03 NOTE — Progress Notes (Signed)
  Subjective:  Patient ID: Derrick [name], male    DOB: 05-18-1932,  MRN: [id number]  Chief Complaint  Patient presents with  . Wound Check    right foot - s/p amputation - doing better- still sore   84 y.o. male returns for wound care.  States that the area still sore believes it to be doing a little bit better.  Has home health care coming and applying medihoney to the wound.  Objective:   There were no vitals filed for this visit. General AA&O x3. Normal mood and affect.  Vascular Foot warm to touch.  Neurologic Sensation grossly diminished.  Dermatologic (Wound) Wound Location: Right lateral forefoot Wound Measurement: 2x2x0.1 Wound Base: 90% granular, 10% fibrotic  Peri-wound: Normal Exudate: None: wound tissue dry Wound progress: No change since last check.  Orthopedic: No pain to palpation either foot.   Assessment & Plan:  Patient was evaluated and treated and all questions answered.  Ulcer right lateral foot secondary to dehiscence from transmetatarsal application -Debrided as below. -Slowly improving.  Unable to further apply skin graft substitute in the office due to insurance issues. -Discussed with patient that he may benefit from referral to wound care center for continued care.  Procedure: Selective Debridement of Wound Rationale: Removal of devitalized tissue from the wound to promote healing.  Pre-Debridement Wound Measurements: 2 cm x 2 cm x 0.1 cm  Post-Debridement Wound Measurements: same as pre-debridement. Type of Debridement: Selective Tissue Removed: Devitalized soft-tissue Instrumentation: 3-0 mm dermal curette Dressing: Dry, sterile, compression dressing. Disposition: Patient tolerated procedure well. Patient to return in 1 week for follow-up.    Return in about 1 week (around 01/10/2018) for Wound Care.

## 2018-01-04 NOTE — Progress Notes (Signed)
  Subjective:  Patient ID: Derrick [name], male    DOB: 30-Dec-1931,  MRN: [id number]  No chief complaint on file.  84 y.o. male returns for wound care. Believes the wound to be Improving.underwent debridement and irrigation with application of epi-fix. Denies N/V/F/Ch.  Objective:  There were no vitals filed for this visit. General AA&O x3. Normal mood and affect.  Vascular Foot warm to touch.  Neurologic Sensation grossly diminished.  Dermatologic (Wound) Wound Location:Right lateral foot Wound Measurement: 2*2*0.1 Wound Base: Granular/Healthy Peri-wound: Normal Exudate: None: wound tissue dry  Wound progress: Improved since last check.  Orthopedic: No pain to palpation either foot.   Assessment & Plan:  Patient was evaluated and treated and all questions answered.  Ulcer Right lateral foot -Graft appears to have promoted granulation tissue.  Medihoney and dry sterile dressing applied.  Continue medihoney and dry sterile dressing with Home health care   Return in about 1 week (around 12/27/2017) for Wound Care.

## 2018-01-06 DIAGNOSIS — J449 Chronic obstructive pulmonary disease, unspecified: Secondary | ICD-10-CM | POA: Diagnosis not present

## 2018-01-06 DIAGNOSIS — N400 Enlarged prostate without lower urinary tract symptoms: Secondary | ICD-10-CM | POA: Diagnosis not present

## 2018-01-06 DIAGNOSIS — E7800 Pure hypercholesterolemia, unspecified: Secondary | ICD-10-CM | POA: Diagnosis not present

## 2018-01-06 DIAGNOSIS — I129 Hypertensive chronic kidney disease with stage 1 through stage 4 chronic kidney disease, or unspecified chronic kidney disease: Secondary | ICD-10-CM | POA: Diagnosis not present

## 2018-01-06 DIAGNOSIS — K219 Gastro-esophageal reflux disease without esophagitis: Secondary | ICD-10-CM | POA: Diagnosis not present

## 2018-01-06 DIAGNOSIS — T8141XA Infection following a procedure, superficial incisional surgical site, initial encounter: Secondary | ICD-10-CM | POA: Diagnosis not present

## 2018-01-06 DIAGNOSIS — Z452 Encounter for adjustment and management of vascular access device: Secondary | ICD-10-CM | POA: Diagnosis not present

## 2018-01-06 DIAGNOSIS — I739 Peripheral vascular disease, unspecified: Secondary | ICD-10-CM | POA: Diagnosis not present

## 2018-01-06 DIAGNOSIS — I2510 Atherosclerotic heart disease of native coronary artery without angina pectoris: Secondary | ICD-10-CM | POA: Diagnosis not present

## 2018-01-06 DIAGNOSIS — N183 Chronic kidney disease, stage 3 (moderate): Secondary | ICD-10-CM | POA: Diagnosis not present

## 2018-01-06 DIAGNOSIS — Z7952 Long term (current) use of systemic steroids: Secondary | ICD-10-CM | POA: Diagnosis not present

## 2018-01-06 DIAGNOSIS — Z7982 Long term (current) use of aspirin: Secondary | ICD-10-CM | POA: Diagnosis not present

## 2018-01-06 DIAGNOSIS — Z792 Long term (current) use of antibiotics: Secondary | ICD-10-CM | POA: Diagnosis not present

## 2018-01-07 DIAGNOSIS — Z4781 Encounter for orthopedic aftercare following surgical amputation: Secondary | ICD-10-CM | POA: Diagnosis not present

## 2018-01-07 DIAGNOSIS — E7800 Pure hypercholesterolemia, unspecified: Secondary | ICD-10-CM | POA: Diagnosis not present

## 2018-01-07 DIAGNOSIS — I10 Essential (primary) hypertension: Secondary | ICD-10-CM | POA: Diagnosis not present

## 2018-01-07 DIAGNOSIS — Z0000 Encounter for general adult medical examination without abnormal findings: Secondary | ICD-10-CM | POA: Diagnosis not present

## 2018-01-07 DIAGNOSIS — M069 Rheumatoid arthritis, unspecified: Secondary | ICD-10-CM | POA: Diagnosis not present

## 2018-01-09 DIAGNOSIS — N400 Enlarged prostate without lower urinary tract symptoms: Secondary | ICD-10-CM | POA: Diagnosis not present

## 2018-01-09 DIAGNOSIS — Z7982 Long term (current) use of aspirin: Secondary | ICD-10-CM | POA: Diagnosis not present

## 2018-01-09 DIAGNOSIS — Z7952 Long term (current) use of systemic steroids: Secondary | ICD-10-CM | POA: Diagnosis not present

## 2018-01-09 DIAGNOSIS — J449 Chronic obstructive pulmonary disease, unspecified: Secondary | ICD-10-CM | POA: Diagnosis not present

## 2018-01-09 DIAGNOSIS — T8141XA Infection following a procedure, superficial incisional surgical site, initial encounter: Secondary | ICD-10-CM | POA: Diagnosis not present

## 2018-01-09 DIAGNOSIS — N183 Chronic kidney disease, stage 3 (moderate): Secondary | ICD-10-CM | POA: Diagnosis not present

## 2018-01-09 DIAGNOSIS — I129 Hypertensive chronic kidney disease with stage 1 through stage 4 chronic kidney disease, or unspecified chronic kidney disease: Secondary | ICD-10-CM | POA: Diagnosis not present

## 2018-01-09 DIAGNOSIS — I2510 Atherosclerotic heart disease of native coronary artery without angina pectoris: Secondary | ICD-10-CM | POA: Diagnosis not present

## 2018-01-09 DIAGNOSIS — Z792 Long term (current) use of antibiotics: Secondary | ICD-10-CM | POA: Diagnosis not present

## 2018-01-09 DIAGNOSIS — K219 Gastro-esophageal reflux disease without esophagitis: Secondary | ICD-10-CM | POA: Diagnosis not present

## 2018-01-09 DIAGNOSIS — I739 Peripheral vascular disease, unspecified: Secondary | ICD-10-CM | POA: Diagnosis not present

## 2018-01-09 DIAGNOSIS — Z452 Encounter for adjustment and management of vascular access device: Secondary | ICD-10-CM | POA: Diagnosis not present

## 2018-01-09 DIAGNOSIS — E7800 Pure hypercholesterolemia, unspecified: Secondary | ICD-10-CM | POA: Diagnosis not present

## 2018-01-10 ENCOUNTER — Ambulatory Visit (INDEPENDENT_AMBULATORY_CARE_PROVIDER_SITE_OTHER): Payer: Medicare Other | Admitting: Podiatry

## 2018-01-10 DIAGNOSIS — L97511 Non-pressure chronic ulcer of other part of right foot limited to breakdown of skin: Secondary | ICD-10-CM | POA: Diagnosis not present

## 2018-01-13 ENCOUNTER — Telehealth: Payer: Self-pay | Admitting: *Deleted

## 2018-01-13 DIAGNOSIS — E7800 Pure hypercholesterolemia, unspecified: Secondary | ICD-10-CM | POA: Diagnosis not present

## 2018-01-13 DIAGNOSIS — Z7982 Long term (current) use of aspirin: Secondary | ICD-10-CM | POA: Diagnosis not present

## 2018-01-13 DIAGNOSIS — T8141XA Infection following a procedure, superficial incisional surgical site, initial encounter: Secondary | ICD-10-CM | POA: Diagnosis not present

## 2018-01-13 DIAGNOSIS — K219 Gastro-esophageal reflux disease without esophagitis: Secondary | ICD-10-CM | POA: Diagnosis not present

## 2018-01-13 DIAGNOSIS — J449 Chronic obstructive pulmonary disease, unspecified: Secondary | ICD-10-CM | POA: Diagnosis not present

## 2018-01-13 DIAGNOSIS — I739 Peripheral vascular disease, unspecified: Secondary | ICD-10-CM | POA: Diagnosis not present

## 2018-01-13 DIAGNOSIS — I2510 Atherosclerotic heart disease of native coronary artery without angina pectoris: Secondary | ICD-10-CM | POA: Diagnosis not present

## 2018-01-13 DIAGNOSIS — N183 Chronic kidney disease, stage 3 (moderate): Secondary | ICD-10-CM | POA: Diagnosis not present

## 2018-01-13 DIAGNOSIS — Z792 Long term (current) use of antibiotics: Secondary | ICD-10-CM | POA: Diagnosis not present

## 2018-01-13 DIAGNOSIS — I129 Hypertensive chronic kidney disease with stage 1 through stage 4 chronic kidney disease, or unspecified chronic kidney disease: Secondary | ICD-10-CM | POA: Diagnosis not present

## 2018-01-13 DIAGNOSIS — N400 Enlarged prostate without lower urinary tract symptoms: Secondary | ICD-10-CM | POA: Diagnosis not present

## 2018-01-13 DIAGNOSIS — T8131XD Disruption of external operation (surgical) wound, not elsewhere classified, subsequent encounter: Secondary | ICD-10-CM

## 2018-01-13 DIAGNOSIS — Z7952 Long term (current) use of systemic steroids: Secondary | ICD-10-CM | POA: Diagnosis not present

## 2018-01-13 DIAGNOSIS — Z452 Encounter for adjustment and management of vascular access device: Secondary | ICD-10-CM | POA: Diagnosis not present

## 2018-01-13 NOTE — Telephone Encounter (Signed)
Faxed required referral form, clinicals and demographics to [facility]. Will fax 01/10/2018 clinicals when available.

## 2018-01-13 NOTE — Telephone Encounter (Signed)
-----   Message from [name], DPM sent at 01/10/2018 10:08 AM EDT ----- Regarding: [facility] Referral Can we refer him to the wound care center for eval? Thanks

## 2018-01-16 ENCOUNTER — Encounter (HOSPITAL_BASED_OUTPATIENT_CLINIC_OR_DEPARTMENT_OTHER): Payer: Medicare Other | Attending: Internal Medicine

## 2018-01-16 ENCOUNTER — Ambulatory Visit (HOSPITAL_COMMUNITY)
Admission: RE | Admit: 2018-01-16 | Discharge: 2018-01-16 | Disposition: A | Discharge status: Home / Self Care | Payer: Medicare Other | Source: Ambulatory Visit | Attending: Internal Medicine | Admitting: Internal Medicine

## 2018-01-16 ENCOUNTER — Other Ambulatory Visit (HOSPITAL_BASED_OUTPATIENT_CLINIC_OR_DEPARTMENT_OTHER): Payer: Self-pay | Admitting: Internal Medicine

## 2018-01-16 DIAGNOSIS — N400 Enlarged prostate without lower urinary tract symptoms: Secondary | ICD-10-CM | POA: Diagnosis not present

## 2018-01-16 DIAGNOSIS — M86171 Other acute osteomyelitis, right ankle and foot: Secondary | ICD-10-CM

## 2018-01-16 DIAGNOSIS — Z89431 Acquired absence of right foot: Secondary | ICD-10-CM | POA: Insufficient documentation

## 2018-01-16 DIAGNOSIS — I739 Peripheral vascular disease, unspecified: Secondary | ICD-10-CM | POA: Insufficient documentation

## 2018-01-16 DIAGNOSIS — Z87891 Personal history of nicotine dependence: Secondary | ICD-10-CM | POA: Diagnosis not present

## 2018-01-16 DIAGNOSIS — Z85528 Personal history of other malignant neoplasm of kidney: Secondary | ICD-10-CM | POA: Insufficient documentation

## 2018-01-16 DIAGNOSIS — Z7952 Long term (current) use of systemic steroids: Secondary | ICD-10-CM | POA: Diagnosis not present

## 2018-01-16 DIAGNOSIS — I10 Essential (primary) hypertension: Secondary | ICD-10-CM | POA: Diagnosis not present

## 2018-01-16 DIAGNOSIS — I129 Hypertensive chronic kidney disease with stage 1 through stage 4 chronic kidney disease, or unspecified chronic kidney disease: Secondary | ICD-10-CM | POA: Diagnosis not present

## 2018-01-16 DIAGNOSIS — Z7982 Long term (current) use of aspirin: Secondary | ICD-10-CM | POA: Diagnosis not present

## 2018-01-16 DIAGNOSIS — N183 Chronic kidney disease, stage 3 (moderate): Secondary | ICD-10-CM | POA: Diagnosis not present

## 2018-01-16 DIAGNOSIS — I2510 Atherosclerotic heart disease of native coronary artery without angina pectoris: Secondary | ICD-10-CM | POA: Insufficient documentation

## 2018-01-16 DIAGNOSIS — L97512 Non-pressure chronic ulcer of other part of right foot with fat layer exposed: Secondary | ICD-10-CM | POA: Diagnosis not present

## 2018-01-16 DIAGNOSIS — T8189XA Other complications of procedures, not elsewhere classified, initial encounter: Secondary | ICD-10-CM | POA: Insufficient documentation

## 2018-01-16 DIAGNOSIS — K219 Gastro-esophageal reflux disease without esophagitis: Secondary | ICD-10-CM | POA: Diagnosis not present

## 2018-01-16 DIAGNOSIS — Z905 Acquired absence of kidney: Secondary | ICD-10-CM | POA: Diagnosis not present

## 2018-01-16 DIAGNOSIS — Z452 Encounter for adjustment and management of vascular access device: Secondary | ICD-10-CM | POA: Diagnosis not present

## 2018-01-16 DIAGNOSIS — Y835 Amputation of limb(s) as the cause of abnormal reaction of the patient, or of later complication, without mention of misadventure at the time of the procedure: Secondary | ICD-10-CM | POA: Insufficient documentation

## 2018-01-16 DIAGNOSIS — I70235 Atherosclerosis of native arteries of right leg with ulceration of other part of foot: Secondary | ICD-10-CM | POA: Diagnosis not present

## 2018-01-16 DIAGNOSIS — E7800 Pure hypercholesterolemia, unspecified: Secondary | ICD-10-CM | POA: Diagnosis not present

## 2018-01-16 DIAGNOSIS — Z792 Long term (current) use of antibiotics: Secondary | ICD-10-CM | POA: Diagnosis not present

## 2018-01-16 DIAGNOSIS — T8141XA Infection following a procedure, superficial incisional surgical site, initial encounter: Secondary | ICD-10-CM | POA: Diagnosis not present

## 2018-01-16 DIAGNOSIS — J449 Chronic obstructive pulmonary disease, unspecified: Secondary | ICD-10-CM | POA: Diagnosis not present

## 2018-01-21 DIAGNOSIS — E7800 Pure hypercholesterolemia, unspecified: Secondary | ICD-10-CM | POA: Diagnosis not present

## 2018-01-21 DIAGNOSIS — N400 Enlarged prostate without lower urinary tract symptoms: Secondary | ICD-10-CM | POA: Diagnosis not present

## 2018-01-21 DIAGNOSIS — Z7982 Long term (current) use of aspirin: Secondary | ICD-10-CM | POA: Diagnosis not present

## 2018-01-21 DIAGNOSIS — I2510 Atherosclerotic heart disease of native coronary artery without angina pectoris: Secondary | ICD-10-CM | POA: Diagnosis not present

## 2018-01-21 DIAGNOSIS — Z452 Encounter for adjustment and management of vascular access device: Secondary | ICD-10-CM | POA: Diagnosis not present

## 2018-01-21 DIAGNOSIS — I129 Hypertensive chronic kidney disease with stage 1 through stage 4 chronic kidney disease, or unspecified chronic kidney disease: Secondary | ICD-10-CM | POA: Diagnosis not present

## 2018-01-21 DIAGNOSIS — K219 Gastro-esophageal reflux disease without esophagitis: Secondary | ICD-10-CM | POA: Diagnosis not present

## 2018-01-21 DIAGNOSIS — J449 Chronic obstructive pulmonary disease, unspecified: Secondary | ICD-10-CM | POA: Diagnosis not present

## 2018-01-21 DIAGNOSIS — I739 Peripheral vascular disease, unspecified: Secondary | ICD-10-CM | POA: Diagnosis not present

## 2018-01-21 DIAGNOSIS — T8141XA Infection following a procedure, superficial incisional surgical site, initial encounter: Secondary | ICD-10-CM | POA: Diagnosis not present

## 2018-01-21 DIAGNOSIS — Z7952 Long term (current) use of systemic steroids: Secondary | ICD-10-CM | POA: Diagnosis not present

## 2018-01-21 DIAGNOSIS — N183 Chronic kidney disease, stage 3 (moderate): Secondary | ICD-10-CM | POA: Diagnosis not present

## 2018-01-21 DIAGNOSIS — Z792 Long term (current) use of antibiotics: Secondary | ICD-10-CM | POA: Diagnosis not present

## 2018-01-23 ENCOUNTER — Ambulatory Visit (INDEPENDENT_AMBULATORY_CARE_PROVIDER_SITE_OTHER): Payer: Medicare Other | Admitting: Podiatry

## 2018-01-23 DIAGNOSIS — Z87891 Personal history of nicotine dependence: Secondary | ICD-10-CM | POA: Diagnosis not present

## 2018-01-23 DIAGNOSIS — L97511 Non-pressure chronic ulcer of other part of right foot limited to breakdown of skin: Secondary | ICD-10-CM | POA: Diagnosis not present

## 2018-01-23 DIAGNOSIS — I10 Essential (primary) hypertension: Secondary | ICD-10-CM | POA: Diagnosis not present

## 2018-01-23 DIAGNOSIS — I739 Peripheral vascular disease, unspecified: Secondary | ICD-10-CM | POA: Diagnosis not present

## 2018-01-23 DIAGNOSIS — I2510 Atherosclerotic heart disease of native coronary artery without angina pectoris: Secondary | ICD-10-CM | POA: Diagnosis not present

## 2018-01-23 DIAGNOSIS — T8189XA Other complications of procedures, not elsewhere classified, initial encounter: Secondary | ICD-10-CM | POA: Diagnosis not present

## 2018-01-23 DIAGNOSIS — Z905 Acquired absence of kidney: Secondary | ICD-10-CM | POA: Diagnosis not present

## 2018-01-23 DIAGNOSIS — Z89431 Acquired absence of right foot: Secondary | ICD-10-CM | POA: Diagnosis not present

## 2018-01-23 DIAGNOSIS — Z85528 Personal history of other malignant neoplasm of kidney: Secondary | ICD-10-CM | POA: Diagnosis not present

## 2018-01-25 DIAGNOSIS — N183 Chronic kidney disease, stage 3 (moderate): Secondary | ICD-10-CM | POA: Diagnosis not present

## 2018-01-25 DIAGNOSIS — I739 Peripheral vascular disease, unspecified: Secondary | ICD-10-CM | POA: Diagnosis not present

## 2018-01-25 DIAGNOSIS — T8141XA Infection following a procedure, superficial incisional surgical site, initial encounter: Secondary | ICD-10-CM | POA: Diagnosis not present

## 2018-01-25 DIAGNOSIS — Z7952 Long term (current) use of systemic steroids: Secondary | ICD-10-CM | POA: Diagnosis not present

## 2018-01-25 DIAGNOSIS — N400 Enlarged prostate without lower urinary tract symptoms: Secondary | ICD-10-CM | POA: Diagnosis not present

## 2018-01-25 DIAGNOSIS — I2510 Atherosclerotic heart disease of native coronary artery without angina pectoris: Secondary | ICD-10-CM | POA: Diagnosis not present

## 2018-01-25 DIAGNOSIS — K219 Gastro-esophageal reflux disease without esophagitis: Secondary | ICD-10-CM | POA: Diagnosis not present

## 2018-01-25 DIAGNOSIS — I129 Hypertensive chronic kidney disease with stage 1 through stage 4 chronic kidney disease, or unspecified chronic kidney disease: Secondary | ICD-10-CM | POA: Diagnosis not present

## 2018-01-25 DIAGNOSIS — E7800 Pure hypercholesterolemia, unspecified: Secondary | ICD-10-CM | POA: Diagnosis not present

## 2018-01-25 DIAGNOSIS — Z7982 Long term (current) use of aspirin: Secondary | ICD-10-CM | POA: Diagnosis not present

## 2018-01-25 DIAGNOSIS — J449 Chronic obstructive pulmonary disease, unspecified: Secondary | ICD-10-CM | POA: Diagnosis not present

## 2018-01-25 DIAGNOSIS — Z792 Long term (current) use of antibiotics: Secondary | ICD-10-CM | POA: Diagnosis not present

## 2018-01-25 DIAGNOSIS — Z452 Encounter for adjustment and management of vascular access device: Secondary | ICD-10-CM | POA: Diagnosis not present

## 2018-01-27 DIAGNOSIS — I739 Peripheral vascular disease, unspecified: Secondary | ICD-10-CM | POA: Diagnosis not present

## 2018-01-27 DIAGNOSIS — I129 Hypertensive chronic kidney disease with stage 1 through stage 4 chronic kidney disease, or unspecified chronic kidney disease: Secondary | ICD-10-CM | POA: Diagnosis not present

## 2018-01-27 DIAGNOSIS — I2510 Atherosclerotic heart disease of native coronary artery without angina pectoris: Secondary | ICD-10-CM | POA: Diagnosis not present

## 2018-01-27 DIAGNOSIS — K219 Gastro-esophageal reflux disease without esophagitis: Secondary | ICD-10-CM | POA: Diagnosis not present

## 2018-01-27 DIAGNOSIS — Z7952 Long term (current) use of systemic steroids: Secondary | ICD-10-CM | POA: Diagnosis not present

## 2018-01-27 DIAGNOSIS — Z7982 Long term (current) use of aspirin: Secondary | ICD-10-CM | POA: Diagnosis not present

## 2018-01-27 DIAGNOSIS — Z452 Encounter for adjustment and management of vascular access device: Secondary | ICD-10-CM | POA: Diagnosis not present

## 2018-01-27 DIAGNOSIS — N400 Enlarged prostate without lower urinary tract symptoms: Secondary | ICD-10-CM | POA: Diagnosis not present

## 2018-01-27 DIAGNOSIS — Z792 Long term (current) use of antibiotics: Secondary | ICD-10-CM | POA: Diagnosis not present

## 2018-01-27 DIAGNOSIS — T8141XA Infection following a procedure, superficial incisional surgical site, initial encounter: Secondary | ICD-10-CM | POA: Diagnosis not present

## 2018-01-27 DIAGNOSIS — E7800 Pure hypercholesterolemia, unspecified: Secondary | ICD-10-CM | POA: Diagnosis not present

## 2018-01-27 DIAGNOSIS — N183 Chronic kidney disease, stage 3 (moderate): Secondary | ICD-10-CM | POA: Diagnosis not present

## 2018-01-27 DIAGNOSIS — J449 Chronic obstructive pulmonary disease, unspecified: Secondary | ICD-10-CM | POA: Diagnosis not present

## 2018-01-29 DIAGNOSIS — Z452 Encounter for adjustment and management of vascular access device: Secondary | ICD-10-CM | POA: Diagnosis not present

## 2018-01-29 DIAGNOSIS — N400 Enlarged prostate without lower urinary tract symptoms: Secondary | ICD-10-CM | POA: Diagnosis not present

## 2018-01-29 DIAGNOSIS — E7800 Pure hypercholesterolemia, unspecified: Secondary | ICD-10-CM | POA: Diagnosis not present

## 2018-01-29 DIAGNOSIS — J449 Chronic obstructive pulmonary disease, unspecified: Secondary | ICD-10-CM | POA: Diagnosis not present

## 2018-01-29 DIAGNOSIS — I739 Peripheral vascular disease, unspecified: Secondary | ICD-10-CM | POA: Diagnosis not present

## 2018-01-29 DIAGNOSIS — I2510 Atherosclerotic heart disease of native coronary artery without angina pectoris: Secondary | ICD-10-CM | POA: Diagnosis not present

## 2018-01-29 DIAGNOSIS — Z7982 Long term (current) use of aspirin: Secondary | ICD-10-CM | POA: Diagnosis not present

## 2018-01-29 DIAGNOSIS — N183 Chronic kidney disease, stage 3 (moderate): Secondary | ICD-10-CM | POA: Diagnosis not present

## 2018-01-29 DIAGNOSIS — T8789 Other complications of amputation stump: Secondary | ICD-10-CM | POA: Diagnosis not present

## 2018-01-29 DIAGNOSIS — I129 Hypertensive chronic kidney disease with stage 1 through stage 4 chronic kidney disease, or unspecified chronic kidney disease: Secondary | ICD-10-CM | POA: Diagnosis not present

## 2018-01-29 DIAGNOSIS — K219 Gastro-esophageal reflux disease without esophagitis: Secondary | ICD-10-CM | POA: Diagnosis not present

## 2018-01-29 DIAGNOSIS — Z792 Long term (current) use of antibiotics: Secondary | ICD-10-CM | POA: Diagnosis not present

## 2018-01-29 DIAGNOSIS — Z7952 Long term (current) use of systemic steroids: Secondary | ICD-10-CM | POA: Diagnosis not present

## 2018-01-30 DIAGNOSIS — Z905 Acquired absence of kidney: Secondary | ICD-10-CM | POA: Diagnosis not present

## 2018-01-30 DIAGNOSIS — T8189XA Other complications of procedures, not elsewhere classified, initial encounter: Secondary | ICD-10-CM | POA: Diagnosis not present

## 2018-01-30 DIAGNOSIS — I739 Peripheral vascular disease, unspecified: Secondary | ICD-10-CM | POA: Diagnosis not present

## 2018-01-30 DIAGNOSIS — Z85528 Personal history of other malignant neoplasm of kidney: Secondary | ICD-10-CM | POA: Diagnosis not present

## 2018-01-30 DIAGNOSIS — Z89431 Acquired absence of right foot: Secondary | ICD-10-CM | POA: Diagnosis not present

## 2018-01-30 DIAGNOSIS — I10 Essential (primary) hypertension: Secondary | ICD-10-CM | POA: Diagnosis not present

## 2018-01-30 DIAGNOSIS — I2510 Atherosclerotic heart disease of native coronary artery without angina pectoris: Secondary | ICD-10-CM | POA: Diagnosis not present

## 2018-01-30 DIAGNOSIS — Z87891 Personal history of nicotine dependence: Secondary | ICD-10-CM | POA: Diagnosis not present

## 2018-02-03 ENCOUNTER — Other Ambulatory Visit (HOSPITAL_BASED_OUTPATIENT_CLINIC_OR_DEPARTMENT_OTHER): Payer: Self-pay | Admitting: Internal Medicine

## 2018-02-03 DIAGNOSIS — Z9181 History of falling: Secondary | ICD-10-CM | POA: Diagnosis not present

## 2018-02-03 DIAGNOSIS — Z7952 Long term (current) use of systemic steroids: Secondary | ICD-10-CM | POA: Diagnosis not present

## 2018-02-03 DIAGNOSIS — Z792 Long term (current) use of antibiotics: Secondary | ICD-10-CM | POA: Diagnosis not present

## 2018-02-03 DIAGNOSIS — Z7982 Long term (current) use of aspirin: Secondary | ICD-10-CM | POA: Diagnosis not present

## 2018-02-03 DIAGNOSIS — I129 Hypertensive chronic kidney disease with stage 1 through stage 4 chronic kidney disease, or unspecified chronic kidney disease: Secondary | ICD-10-CM | POA: Diagnosis not present

## 2018-02-03 DIAGNOSIS — Z452 Encounter for adjustment and management of vascular access device: Secondary | ICD-10-CM | POA: Diagnosis not present

## 2018-02-03 DIAGNOSIS — I739 Peripheral vascular disease, unspecified: Secondary | ICD-10-CM | POA: Diagnosis not present

## 2018-02-03 DIAGNOSIS — S81801A Unspecified open wound, right lower leg, initial encounter: Secondary | ICD-10-CM

## 2018-02-03 DIAGNOSIS — N183 Chronic kidney disease, stage 3 (moderate): Secondary | ICD-10-CM | POA: Diagnosis not present

## 2018-02-03 DIAGNOSIS — N400 Enlarged prostate without lower urinary tract symptoms: Secondary | ICD-10-CM | POA: Diagnosis not present

## 2018-02-03 DIAGNOSIS — J449 Chronic obstructive pulmonary disease, unspecified: Secondary | ICD-10-CM | POA: Diagnosis not present

## 2018-02-03 DIAGNOSIS — K219 Gastro-esophageal reflux disease without esophagitis: Secondary | ICD-10-CM | POA: Diagnosis not present

## 2018-02-03 DIAGNOSIS — E7800 Pure hypercholesterolemia, unspecified: Secondary | ICD-10-CM | POA: Diagnosis not present

## 2018-02-03 DIAGNOSIS — I2510 Atherosclerotic heart disease of native coronary artery without angina pectoris: Secondary | ICD-10-CM | POA: Diagnosis not present

## 2018-02-03 DIAGNOSIS — T8789 Other complications of amputation stump: Secondary | ICD-10-CM | POA: Diagnosis not present

## 2018-02-04 NOTE — Progress Notes (Signed)
  Subjective:  Patient ID: BREVIN MCFADDEN, male    DOB: [birth year],  MRN: [id number]  No chief complaint on file.  84 y.o. male returns for wound care. Wound is doing ok. Denies new issues.  Objective:   There were no vitals filed for this visit. General AA&O x3. Normal mood and affect.  Vascular Foot warm to touch.  Neurologic Sensation grossly diminished.  Dermatologic (Wound) Wound Location: Right lateral forefoot Wound Measurement: 2x2x0.1 Wound Base: 80/20 granular/fibrotic Peri-wound: Normal Exudate: None: wound tissue dry Wound progress: No change since last check.  Orthopedic: No pain to palpation either foot.   Assessment & Plan:  Patient was evaluated and treated and all questions answered.  Ulcer right lateral foot secondary to dehiscence from transmetatarsal application -Dressed with medihoney and DSD. -Will facilitate referral to wound care center for eval.  Return in about 2 weeks (around 01/24/2018) for Wound Care.

## 2018-02-04 NOTE — Progress Notes (Signed)
  Subjective:  Patient ID: Derrick [name], male    DOB: [birth year],  MRN: [id number]  Chief Complaint  Patient presents with  . Wound Check    right foot - healthy skin looks like it's blistering from the tape   84 y.o. male returns for wound care. States the wound is looking ok. Thinks the skin might be blistering from the tape.  Was seen at the wound care center and states XR were taken. Unsure of results  Objective:   There were no vitals filed for this visit. General AA&O x3. Normal mood and affect.  Vascular Foot warm to touch.  Neurologic Sensation grossly diminished.  Dermatologic (Wound) Wound Location: Right lateral forefoot Wound Measurement: 2x2.5x0.1 Wound Base: granular Peri-wound: Normal Exudate: None: wound tissue dry  Wound progress: No change since last check.  Orthopedic: No pain to palpation either foot.   Assessment & Plan:  Patient was evaluated and treated and all questions answered.  Ulcer right lateral foot secondary to dehiscence from transmetatarsal application -Dressed with medihoney and DSD. -Continue care at wound care center -XR reviewed that was taken at Wound care - no concern for underlying osseous erosion.  Return in about 2 weeks (around 02/06/2018) for Wound Care.

## 2018-02-05 DIAGNOSIS — J449 Chronic obstructive pulmonary disease, unspecified: Secondary | ICD-10-CM | POA: Diagnosis not present

## 2018-02-05 DIAGNOSIS — N183 Chronic kidney disease, stage 3 (moderate): Secondary | ICD-10-CM | POA: Diagnosis not present

## 2018-02-05 DIAGNOSIS — Z7982 Long term (current) use of aspirin: Secondary | ICD-10-CM | POA: Diagnosis not present

## 2018-02-05 DIAGNOSIS — Z9181 History of falling: Secondary | ICD-10-CM | POA: Diagnosis not present

## 2018-02-05 DIAGNOSIS — K219 Gastro-esophageal reflux disease without esophagitis: Secondary | ICD-10-CM | POA: Diagnosis not present

## 2018-02-05 DIAGNOSIS — Z7952 Long term (current) use of systemic steroids: Secondary | ICD-10-CM | POA: Diagnosis not present

## 2018-02-05 DIAGNOSIS — Z452 Encounter for adjustment and management of vascular access device: Secondary | ICD-10-CM | POA: Diagnosis not present

## 2018-02-05 DIAGNOSIS — I129 Hypertensive chronic kidney disease with stage 1 through stage 4 chronic kidney disease, or unspecified chronic kidney disease: Secondary | ICD-10-CM | POA: Diagnosis not present

## 2018-02-05 DIAGNOSIS — E7800 Pure hypercholesterolemia, unspecified: Secondary | ICD-10-CM | POA: Diagnosis not present

## 2018-02-05 DIAGNOSIS — I739 Peripheral vascular disease, unspecified: Secondary | ICD-10-CM | POA: Diagnosis not present

## 2018-02-05 DIAGNOSIS — Z792 Long term (current) use of antibiotics: Secondary | ICD-10-CM | POA: Diagnosis not present

## 2018-02-05 DIAGNOSIS — T8789 Other complications of amputation stump: Secondary | ICD-10-CM | POA: Diagnosis not present

## 2018-02-05 DIAGNOSIS — I2510 Atherosclerotic heart disease of native coronary artery without angina pectoris: Secondary | ICD-10-CM | POA: Diagnosis not present

## 2018-02-05 DIAGNOSIS — N400 Enlarged prostate without lower urinary tract symptoms: Secondary | ICD-10-CM | POA: Diagnosis not present

## 2018-02-07 ENCOUNTER — Encounter (HOSPITAL_BASED_OUTPATIENT_CLINIC_OR_DEPARTMENT_OTHER): Payer: Medicare Other | Attending: Internal Medicine

## 2018-02-07 ENCOUNTER — Ambulatory Visit: Payer: Medicare Other | Admitting: Podiatry

## 2018-02-07 DIAGNOSIS — I70234 Atherosclerosis of native arteries of right leg with ulceration of heel and midfoot: Secondary | ICD-10-CM | POA: Diagnosis not present

## 2018-02-07 DIAGNOSIS — I2510 Atherosclerotic heart disease of native coronary artery without angina pectoris: Secondary | ICD-10-CM | POA: Diagnosis not present

## 2018-02-07 DIAGNOSIS — L97512 Non-pressure chronic ulcer of other part of right foot with fat layer exposed: Secondary | ICD-10-CM | POA: Insufficient documentation

## 2018-02-07 DIAGNOSIS — I10 Essential (primary) hypertension: Secondary | ICD-10-CM | POA: Diagnosis not present

## 2018-02-07 DIAGNOSIS — T8189XA Other complications of procedures, not elsewhere classified, initial encounter: Secondary | ICD-10-CM | POA: Diagnosis not present

## 2018-02-10 DIAGNOSIS — N400 Enlarged prostate without lower urinary tract symptoms: Secondary | ICD-10-CM | POA: Diagnosis not present

## 2018-02-10 DIAGNOSIS — T8189XA Other complications of procedures, not elsewhere classified, initial encounter: Secondary | ICD-10-CM | POA: Diagnosis not present

## 2018-02-10 DIAGNOSIS — Z9181 History of falling: Secondary | ICD-10-CM | POA: Diagnosis not present

## 2018-02-10 DIAGNOSIS — N183 Chronic kidney disease, stage 3 (moderate): Secondary | ICD-10-CM | POA: Diagnosis not present

## 2018-02-10 DIAGNOSIS — I2510 Atherosclerotic heart disease of native coronary artery without angina pectoris: Secondary | ICD-10-CM | POA: Diagnosis not present

## 2018-02-10 DIAGNOSIS — Z7952 Long term (current) use of systemic steroids: Secondary | ICD-10-CM | POA: Diagnosis not present

## 2018-02-10 DIAGNOSIS — Z792 Long term (current) use of antibiotics: Secondary | ICD-10-CM | POA: Diagnosis not present

## 2018-02-10 DIAGNOSIS — I129 Hypertensive chronic kidney disease with stage 1 through stage 4 chronic kidney disease, or unspecified chronic kidney disease: Secondary | ICD-10-CM | POA: Diagnosis not present

## 2018-02-10 DIAGNOSIS — T8789 Other complications of amputation stump: Secondary | ICD-10-CM | POA: Diagnosis not present

## 2018-02-10 DIAGNOSIS — Z7982 Long term (current) use of aspirin: Secondary | ICD-10-CM | POA: Diagnosis not present

## 2018-02-10 DIAGNOSIS — K219 Gastro-esophageal reflux disease without esophagitis: Secondary | ICD-10-CM | POA: Diagnosis not present

## 2018-02-10 DIAGNOSIS — Z452 Encounter for adjustment and management of vascular access device: Secondary | ICD-10-CM | POA: Diagnosis not present

## 2018-02-10 DIAGNOSIS — I739 Peripheral vascular disease, unspecified: Secondary | ICD-10-CM | POA: Diagnosis not present

## 2018-02-10 DIAGNOSIS — E7800 Pure hypercholesterolemia, unspecified: Secondary | ICD-10-CM | POA: Diagnosis not present

## 2018-02-10 DIAGNOSIS — J449 Chronic obstructive pulmonary disease, unspecified: Secondary | ICD-10-CM | POA: Diagnosis not present

## 2018-02-11 DIAGNOSIS — E7800 Pure hypercholesterolemia, unspecified: Secondary | ICD-10-CM | POA: Diagnosis not present

## 2018-02-11 DIAGNOSIS — Z7952 Long term (current) use of systemic steroids: Secondary | ICD-10-CM | POA: Diagnosis not present

## 2018-02-11 DIAGNOSIS — K219 Gastro-esophageal reflux disease without esophagitis: Secondary | ICD-10-CM | POA: Diagnosis not present

## 2018-02-11 DIAGNOSIS — I739 Peripheral vascular disease, unspecified: Secondary | ICD-10-CM | POA: Diagnosis not present

## 2018-02-11 DIAGNOSIS — J449 Chronic obstructive pulmonary disease, unspecified: Secondary | ICD-10-CM | POA: Diagnosis not present

## 2018-02-11 DIAGNOSIS — Z792 Long term (current) use of antibiotics: Secondary | ICD-10-CM | POA: Diagnosis not present

## 2018-02-11 DIAGNOSIS — N400 Enlarged prostate without lower urinary tract symptoms: Secondary | ICD-10-CM | POA: Diagnosis not present

## 2018-02-11 DIAGNOSIS — I2510 Atherosclerotic heart disease of native coronary artery without angina pectoris: Secondary | ICD-10-CM | POA: Diagnosis not present

## 2018-02-11 DIAGNOSIS — T8789 Other complications of amputation stump: Secondary | ICD-10-CM | POA: Diagnosis not present

## 2018-02-11 DIAGNOSIS — Z452 Encounter for adjustment and management of vascular access device: Secondary | ICD-10-CM | POA: Diagnosis not present

## 2018-02-11 DIAGNOSIS — Z7982 Long term (current) use of aspirin: Secondary | ICD-10-CM | POA: Diagnosis not present

## 2018-02-11 DIAGNOSIS — Z9181 History of falling: Secondary | ICD-10-CM | POA: Diagnosis not present

## 2018-02-11 DIAGNOSIS — N183 Chronic kidney disease, stage 3 (moderate): Secondary | ICD-10-CM | POA: Diagnosis not present

## 2018-02-11 DIAGNOSIS — I129 Hypertensive chronic kidney disease with stage 1 through stage 4 chronic kidney disease, or unspecified chronic kidney disease: Secondary | ICD-10-CM | POA: Diagnosis not present

## 2018-02-13 ENCOUNTER — Ambulatory Visit: Payer: Medicare Other | Admitting: Podiatry

## 2018-02-13 DIAGNOSIS — Z7982 Long term (current) use of aspirin: Secondary | ICD-10-CM | POA: Diagnosis not present

## 2018-02-13 DIAGNOSIS — Z9181 History of falling: Secondary | ICD-10-CM | POA: Diagnosis not present

## 2018-02-13 DIAGNOSIS — Z792 Long term (current) use of antibiotics: Secondary | ICD-10-CM | POA: Diagnosis not present

## 2018-02-13 DIAGNOSIS — Z7952 Long term (current) use of systemic steroids: Secondary | ICD-10-CM | POA: Diagnosis not present

## 2018-02-13 DIAGNOSIS — Z452 Encounter for adjustment and management of vascular access device: Secondary | ICD-10-CM | POA: Diagnosis not present

## 2018-02-13 DIAGNOSIS — N400 Enlarged prostate without lower urinary tract symptoms: Secondary | ICD-10-CM | POA: Diagnosis not present

## 2018-02-13 DIAGNOSIS — E7800 Pure hypercholesterolemia, unspecified: Secondary | ICD-10-CM | POA: Diagnosis not present

## 2018-02-13 DIAGNOSIS — K219 Gastro-esophageal reflux disease without esophagitis: Secondary | ICD-10-CM | POA: Diagnosis not present

## 2018-02-13 DIAGNOSIS — I2510 Atherosclerotic heart disease of native coronary artery without angina pectoris: Secondary | ICD-10-CM | POA: Diagnosis not present

## 2018-02-13 DIAGNOSIS — I129 Hypertensive chronic kidney disease with stage 1 through stage 4 chronic kidney disease, or unspecified chronic kidney disease: Secondary | ICD-10-CM | POA: Diagnosis not present

## 2018-02-13 DIAGNOSIS — N183 Chronic kidney disease, stage 3 (moderate): Secondary | ICD-10-CM | POA: Diagnosis not present

## 2018-02-13 DIAGNOSIS — J449 Chronic obstructive pulmonary disease, unspecified: Secondary | ICD-10-CM | POA: Diagnosis not present

## 2018-02-13 DIAGNOSIS — I739 Peripheral vascular disease, unspecified: Secondary | ICD-10-CM | POA: Diagnosis not present

## 2018-02-13 DIAGNOSIS — T8789 Other complications of amputation stump: Secondary | ICD-10-CM | POA: Diagnosis not present

## 2018-02-14 ENCOUNTER — Ambulatory Visit (INDEPENDENT_AMBULATORY_CARE_PROVIDER_SITE_OTHER): Payer: Medicare Other | Admitting: Podiatry

## 2018-02-14 DIAGNOSIS — I70234 Atherosclerosis of native arteries of right leg with ulceration of heel and midfoot: Secondary | ICD-10-CM | POA: Diagnosis not present

## 2018-02-14 DIAGNOSIS — L97512 Non-pressure chronic ulcer of other part of right foot with fat layer exposed: Secondary | ICD-10-CM | POA: Diagnosis not present

## 2018-02-14 DIAGNOSIS — Z9889 Other specified postprocedural states: Secondary | ICD-10-CM

## 2018-02-14 DIAGNOSIS — T8189XA Other complications of procedures, not elsewhere classified, initial encounter: Secondary | ICD-10-CM | POA: Diagnosis not present

## 2018-02-14 DIAGNOSIS — I10 Essential (primary) hypertension: Secondary | ICD-10-CM | POA: Diagnosis not present

## 2018-02-14 DIAGNOSIS — I2510 Atherosclerotic heart disease of native coronary artery without angina pectoris: Secondary | ICD-10-CM | POA: Diagnosis not present

## 2018-02-18 DIAGNOSIS — Z452 Encounter for adjustment and management of vascular access device: Secondary | ICD-10-CM | POA: Diagnosis not present

## 2018-02-18 DIAGNOSIS — Z7952 Long term (current) use of systemic steroids: Secondary | ICD-10-CM | POA: Diagnosis not present

## 2018-02-18 DIAGNOSIS — I129 Hypertensive chronic kidney disease with stage 1 through stage 4 chronic kidney disease, or unspecified chronic kidney disease: Secondary | ICD-10-CM | POA: Diagnosis not present

## 2018-02-18 DIAGNOSIS — J449 Chronic obstructive pulmonary disease, unspecified: Secondary | ICD-10-CM | POA: Diagnosis not present

## 2018-02-18 DIAGNOSIS — I739 Peripheral vascular disease, unspecified: Secondary | ICD-10-CM | POA: Diagnosis not present

## 2018-02-18 DIAGNOSIS — T8789 Other complications of amputation stump: Secondary | ICD-10-CM | POA: Diagnosis not present

## 2018-02-18 DIAGNOSIS — N183 Chronic kidney disease, stage 3 (moderate): Secondary | ICD-10-CM | POA: Diagnosis not present

## 2018-02-18 DIAGNOSIS — I2510 Atherosclerotic heart disease of native coronary artery without angina pectoris: Secondary | ICD-10-CM | POA: Diagnosis not present

## 2018-02-18 DIAGNOSIS — Z792 Long term (current) use of antibiotics: Secondary | ICD-10-CM | POA: Diagnosis not present

## 2018-02-18 DIAGNOSIS — Z7982 Long term (current) use of aspirin: Secondary | ICD-10-CM | POA: Diagnosis not present

## 2018-02-18 DIAGNOSIS — Z9181 History of falling: Secondary | ICD-10-CM | POA: Diagnosis not present

## 2018-02-18 DIAGNOSIS — E7800 Pure hypercholesterolemia, unspecified: Secondary | ICD-10-CM | POA: Diagnosis not present

## 2018-02-18 DIAGNOSIS — N400 Enlarged prostate without lower urinary tract symptoms: Secondary | ICD-10-CM | POA: Diagnosis not present

## 2018-02-18 DIAGNOSIS — K219 Gastro-esophageal reflux disease without esophagitis: Secondary | ICD-10-CM | POA: Diagnosis not present

## 2018-02-19 ENCOUNTER — Other Ambulatory Visit: Payer: Medicare Other

## 2018-02-20 DIAGNOSIS — I739 Peripheral vascular disease, unspecified: Secondary | ICD-10-CM | POA: Diagnosis not present

## 2018-02-20 DIAGNOSIS — K219 Gastro-esophageal reflux disease without esophagitis: Secondary | ICD-10-CM | POA: Diagnosis not present

## 2018-02-20 DIAGNOSIS — N400 Enlarged prostate without lower urinary tract symptoms: Secondary | ICD-10-CM | POA: Diagnosis not present

## 2018-02-20 DIAGNOSIS — Z7982 Long term (current) use of aspirin: Secondary | ICD-10-CM | POA: Diagnosis not present

## 2018-02-20 DIAGNOSIS — J449 Chronic obstructive pulmonary disease, unspecified: Secondary | ICD-10-CM | POA: Diagnosis not present

## 2018-02-20 DIAGNOSIS — I129 Hypertensive chronic kidney disease with stage 1 through stage 4 chronic kidney disease, or unspecified chronic kidney disease: Secondary | ICD-10-CM | POA: Diagnosis not present

## 2018-02-20 DIAGNOSIS — N183 Chronic kidney disease, stage 3 (moderate): Secondary | ICD-10-CM | POA: Diagnosis not present

## 2018-02-20 DIAGNOSIS — Z792 Long term (current) use of antibiotics: Secondary | ICD-10-CM | POA: Diagnosis not present

## 2018-02-20 DIAGNOSIS — Z452 Encounter for adjustment and management of vascular access device: Secondary | ICD-10-CM | POA: Diagnosis not present

## 2018-02-20 DIAGNOSIS — Z7952 Long term (current) use of systemic steroids: Secondary | ICD-10-CM | POA: Diagnosis not present

## 2018-02-20 DIAGNOSIS — E7800 Pure hypercholesterolemia, unspecified: Secondary | ICD-10-CM | POA: Diagnosis not present

## 2018-02-20 DIAGNOSIS — T8789 Other complications of amputation stump: Secondary | ICD-10-CM | POA: Diagnosis not present

## 2018-02-20 DIAGNOSIS — I2510 Atherosclerotic heart disease of native coronary artery without angina pectoris: Secondary | ICD-10-CM | POA: Diagnosis not present

## 2018-02-20 DIAGNOSIS — Z9181 History of falling: Secondary | ICD-10-CM | POA: Diagnosis not present

## 2018-02-21 DIAGNOSIS — L97512 Non-pressure chronic ulcer of other part of right foot with fat layer exposed: Secondary | ICD-10-CM | POA: Diagnosis not present

## 2018-02-21 DIAGNOSIS — I70234 Atherosclerosis of native arteries of right leg with ulceration of heel and midfoot: Secondary | ICD-10-CM | POA: Diagnosis not present

## 2018-02-21 DIAGNOSIS — I10 Essential (primary) hypertension: Secondary | ICD-10-CM | POA: Diagnosis not present

## 2018-02-21 DIAGNOSIS — T8189XA Other complications of procedures, not elsewhere classified, initial encounter: Secondary | ICD-10-CM | POA: Diagnosis not present

## 2018-02-21 DIAGNOSIS — I2510 Atherosclerotic heart disease of native coronary artery without angina pectoris: Secondary | ICD-10-CM | POA: Diagnosis not present

## 2018-02-24 ENCOUNTER — Telehealth: Payer: Self-pay | Admitting: Radiology

## 2018-02-24 DIAGNOSIS — Z452 Encounter for adjustment and management of vascular access device: Secondary | ICD-10-CM | POA: Diagnosis not present

## 2018-02-24 DIAGNOSIS — K219 Gastro-esophageal reflux disease without esophagitis: Secondary | ICD-10-CM | POA: Diagnosis not present

## 2018-02-24 DIAGNOSIS — I2510 Atherosclerotic heart disease of native coronary artery without angina pectoris: Secondary | ICD-10-CM | POA: Diagnosis not present

## 2018-02-24 DIAGNOSIS — Z7982 Long term (current) use of aspirin: Secondary | ICD-10-CM | POA: Diagnosis not present

## 2018-02-24 DIAGNOSIS — Z7952 Long term (current) use of systemic steroids: Secondary | ICD-10-CM | POA: Diagnosis not present

## 2018-02-24 DIAGNOSIS — I739 Peripheral vascular disease, unspecified: Secondary | ICD-10-CM | POA: Diagnosis not present

## 2018-02-24 DIAGNOSIS — T8189XA Other complications of procedures, not elsewhere classified, initial encounter: Secondary | ICD-10-CM | POA: Diagnosis not present

## 2018-02-24 DIAGNOSIS — I129 Hypertensive chronic kidney disease with stage 1 through stage 4 chronic kidney disease, or unspecified chronic kidney disease: Secondary | ICD-10-CM | POA: Diagnosis not present

## 2018-02-24 DIAGNOSIS — Z9181 History of falling: Secondary | ICD-10-CM | POA: Diagnosis not present

## 2018-02-24 DIAGNOSIS — T8789 Other complications of amputation stump: Secondary | ICD-10-CM | POA: Diagnosis not present

## 2018-02-24 DIAGNOSIS — N183 Chronic kidney disease, stage 3 (moderate): Secondary | ICD-10-CM | POA: Diagnosis not present

## 2018-02-24 DIAGNOSIS — Z792 Long term (current) use of antibiotics: Secondary | ICD-10-CM | POA: Diagnosis not present

## 2018-02-24 DIAGNOSIS — E7800 Pure hypercholesterolemia, unspecified: Secondary | ICD-10-CM | POA: Diagnosis not present

## 2018-02-24 DIAGNOSIS — N400 Enlarged prostate without lower urinary tract symptoms: Secondary | ICD-10-CM | POA: Diagnosis not present

## 2018-02-24 DIAGNOSIS — J449 Chronic obstructive pulmonary disease, unspecified: Secondary | ICD-10-CM | POA: Diagnosis not present

## 2018-02-24 NOTE — Telephone Encounter (Signed)
Patient left voice mail message to cancel all app'ts w/ Dr [name].  States that he is currently being followed by 2 other physicians.  He does not want to follow up with our office.  Also, Mr Mester stopped by our front office on Friday, Feb 21, 2018 and asked our front office to cancel his appointments with Dr. [name].  Jemiah Cuadra Riki Rusk, RN 02/24/2018 11:33 AM

## 2018-02-25 DIAGNOSIS — M542 Cervicalgia: Secondary | ICD-10-CM | POA: Diagnosis not present

## 2018-02-25 DIAGNOSIS — G4700 Insomnia, unspecified: Secondary | ICD-10-CM | POA: Diagnosis not present

## 2018-02-26 DIAGNOSIS — I129 Hypertensive chronic kidney disease with stage 1 through stage 4 chronic kidney disease, or unspecified chronic kidney disease: Secondary | ICD-10-CM | POA: Diagnosis not present

## 2018-02-26 DIAGNOSIS — Z7982 Long term (current) use of aspirin: Secondary | ICD-10-CM | POA: Diagnosis not present

## 2018-02-26 DIAGNOSIS — Z452 Encounter for adjustment and management of vascular access device: Secondary | ICD-10-CM | POA: Diagnosis not present

## 2018-02-26 DIAGNOSIS — N183 Chronic kidney disease, stage 3 (moderate): Secondary | ICD-10-CM | POA: Diagnosis not present

## 2018-02-26 DIAGNOSIS — I2510 Atherosclerotic heart disease of native coronary artery without angina pectoris: Secondary | ICD-10-CM | POA: Diagnosis not present

## 2018-02-26 DIAGNOSIS — Z792 Long term (current) use of antibiotics: Secondary | ICD-10-CM | POA: Diagnosis not present

## 2018-02-26 DIAGNOSIS — T8789 Other complications of amputation stump: Secondary | ICD-10-CM | POA: Diagnosis not present

## 2018-02-26 DIAGNOSIS — Z7952 Long term (current) use of systemic steroids: Secondary | ICD-10-CM | POA: Diagnosis not present

## 2018-02-26 DIAGNOSIS — Z9181 History of falling: Secondary | ICD-10-CM | POA: Diagnosis not present

## 2018-02-26 DIAGNOSIS — E7800 Pure hypercholesterolemia, unspecified: Secondary | ICD-10-CM | POA: Diagnosis not present

## 2018-02-26 DIAGNOSIS — N400 Enlarged prostate without lower urinary tract symptoms: Secondary | ICD-10-CM | POA: Diagnosis not present

## 2018-02-26 DIAGNOSIS — I739 Peripheral vascular disease, unspecified: Secondary | ICD-10-CM | POA: Diagnosis not present

## 2018-02-26 DIAGNOSIS — K219 Gastro-esophageal reflux disease without esophagitis: Secondary | ICD-10-CM | POA: Diagnosis not present

## 2018-02-26 DIAGNOSIS — J449 Chronic obstructive pulmonary disease, unspecified: Secondary | ICD-10-CM | POA: Diagnosis not present

## 2018-03-04 DIAGNOSIS — Z792 Long term (current) use of antibiotics: Secondary | ICD-10-CM | POA: Diagnosis not present

## 2018-03-04 DIAGNOSIS — N400 Enlarged prostate without lower urinary tract symptoms: Secondary | ICD-10-CM | POA: Diagnosis not present

## 2018-03-04 DIAGNOSIS — I2510 Atherosclerotic heart disease of native coronary artery without angina pectoris: Secondary | ICD-10-CM | POA: Diagnosis not present

## 2018-03-04 DIAGNOSIS — Z452 Encounter for adjustment and management of vascular access device: Secondary | ICD-10-CM | POA: Diagnosis not present

## 2018-03-04 DIAGNOSIS — Z7982 Long term (current) use of aspirin: Secondary | ICD-10-CM | POA: Diagnosis not present

## 2018-03-04 DIAGNOSIS — Z7952 Long term (current) use of systemic steroids: Secondary | ICD-10-CM | POA: Diagnosis not present

## 2018-03-04 DIAGNOSIS — Z9181 History of falling: Secondary | ICD-10-CM | POA: Diagnosis not present

## 2018-03-04 DIAGNOSIS — E7800 Pure hypercholesterolemia, unspecified: Secondary | ICD-10-CM | POA: Diagnosis not present

## 2018-03-04 DIAGNOSIS — J449 Chronic obstructive pulmonary disease, unspecified: Secondary | ICD-10-CM | POA: Diagnosis not present

## 2018-03-04 DIAGNOSIS — K219 Gastro-esophageal reflux disease without esophagitis: Secondary | ICD-10-CM | POA: Diagnosis not present

## 2018-03-04 DIAGNOSIS — T8789 Other complications of amputation stump: Secondary | ICD-10-CM | POA: Diagnosis not present

## 2018-03-04 DIAGNOSIS — N183 Chronic kidney disease, stage 3 (moderate): Secondary | ICD-10-CM | POA: Diagnosis not present

## 2018-03-04 DIAGNOSIS — I739 Peripheral vascular disease, unspecified: Secondary | ICD-10-CM | POA: Diagnosis not present

## 2018-03-04 DIAGNOSIS — I129 Hypertensive chronic kidney disease with stage 1 through stage 4 chronic kidney disease, or unspecified chronic kidney disease: Secondary | ICD-10-CM | POA: Diagnosis not present

## 2018-03-05 ENCOUNTER — Other Ambulatory Visit: Payer: Medicare Other

## 2018-03-05 DIAGNOSIS — I70235 Atherosclerosis of native arteries of right leg with ulceration of other part of foot: Secondary | ICD-10-CM | POA: Diagnosis not present

## 2018-03-05 DIAGNOSIS — I2510 Atherosclerotic heart disease of native coronary artery without angina pectoris: Secondary | ICD-10-CM | POA: Diagnosis not present

## 2018-03-05 DIAGNOSIS — L97512 Non-pressure chronic ulcer of other part of right foot with fat layer exposed: Secondary | ICD-10-CM | POA: Diagnosis not present

## 2018-03-05 DIAGNOSIS — I10 Essential (primary) hypertension: Secondary | ICD-10-CM | POA: Diagnosis not present

## 2018-03-05 DIAGNOSIS — I70234 Atherosclerosis of native arteries of right leg with ulceration of heel and midfoot: Secondary | ICD-10-CM | POA: Diagnosis not present

## 2018-03-07 DIAGNOSIS — T8789 Other complications of amputation stump: Secondary | ICD-10-CM | POA: Diagnosis not present

## 2018-03-07 DIAGNOSIS — I129 Hypertensive chronic kidney disease with stage 1 through stage 4 chronic kidney disease, or unspecified chronic kidney disease: Secondary | ICD-10-CM | POA: Diagnosis not present

## 2018-03-07 DIAGNOSIS — E7800 Pure hypercholesterolemia, unspecified: Secondary | ICD-10-CM | POA: Diagnosis not present

## 2018-03-07 DIAGNOSIS — Z452 Encounter for adjustment and management of vascular access device: Secondary | ICD-10-CM | POA: Diagnosis not present

## 2018-03-07 DIAGNOSIS — Z792 Long term (current) use of antibiotics: Secondary | ICD-10-CM | POA: Diagnosis not present

## 2018-03-07 DIAGNOSIS — N183 Chronic kidney disease, stage 3 (moderate): Secondary | ICD-10-CM | POA: Diagnosis not present

## 2018-03-07 DIAGNOSIS — I739 Peripheral vascular disease, unspecified: Secondary | ICD-10-CM | POA: Diagnosis not present

## 2018-03-07 DIAGNOSIS — I2510 Atherosclerotic heart disease of native coronary artery without angina pectoris: Secondary | ICD-10-CM | POA: Diagnosis not present

## 2018-03-07 DIAGNOSIS — Z7982 Long term (current) use of aspirin: Secondary | ICD-10-CM | POA: Diagnosis not present

## 2018-03-07 DIAGNOSIS — Z9181 History of falling: Secondary | ICD-10-CM | POA: Diagnosis not present

## 2018-03-07 DIAGNOSIS — Z7952 Long term (current) use of systemic steroids: Secondary | ICD-10-CM | POA: Diagnosis not present

## 2018-03-07 DIAGNOSIS — N400 Enlarged prostate without lower urinary tract symptoms: Secondary | ICD-10-CM | POA: Diagnosis not present

## 2018-03-07 DIAGNOSIS — J449 Chronic obstructive pulmonary disease, unspecified: Secondary | ICD-10-CM | POA: Diagnosis not present

## 2018-03-07 DIAGNOSIS — K219 Gastro-esophageal reflux disease without esophagitis: Secondary | ICD-10-CM | POA: Diagnosis not present

## 2018-03-10 DIAGNOSIS — Z452 Encounter for adjustment and management of vascular access device: Secondary | ICD-10-CM | POA: Diagnosis not present

## 2018-03-10 DIAGNOSIS — N400 Enlarged prostate without lower urinary tract symptoms: Secondary | ICD-10-CM | POA: Diagnosis not present

## 2018-03-10 DIAGNOSIS — Z792 Long term (current) use of antibiotics: Secondary | ICD-10-CM | POA: Diagnosis not present

## 2018-03-10 DIAGNOSIS — I739 Peripheral vascular disease, unspecified: Secondary | ICD-10-CM | POA: Diagnosis not present

## 2018-03-10 DIAGNOSIS — Z9181 History of falling: Secondary | ICD-10-CM | POA: Diagnosis not present

## 2018-03-10 DIAGNOSIS — I2510 Atherosclerotic heart disease of native coronary artery without angina pectoris: Secondary | ICD-10-CM | POA: Diagnosis not present

## 2018-03-10 DIAGNOSIS — I129 Hypertensive chronic kidney disease with stage 1 through stage 4 chronic kidney disease, or unspecified chronic kidney disease: Secondary | ICD-10-CM | POA: Diagnosis not present

## 2018-03-10 DIAGNOSIS — N183 Chronic kidney disease, stage 3 (moderate): Secondary | ICD-10-CM | POA: Diagnosis not present

## 2018-03-10 DIAGNOSIS — J449 Chronic obstructive pulmonary disease, unspecified: Secondary | ICD-10-CM | POA: Diagnosis not present

## 2018-03-10 DIAGNOSIS — Z7982 Long term (current) use of aspirin: Secondary | ICD-10-CM | POA: Diagnosis not present

## 2018-03-10 DIAGNOSIS — T8789 Other complications of amputation stump: Secondary | ICD-10-CM | POA: Diagnosis not present

## 2018-03-10 DIAGNOSIS — E7800 Pure hypercholesterolemia, unspecified: Secondary | ICD-10-CM | POA: Diagnosis not present

## 2018-03-10 DIAGNOSIS — K219 Gastro-esophageal reflux disease without esophagitis: Secondary | ICD-10-CM | POA: Diagnosis not present

## 2018-03-10 DIAGNOSIS — Z7952 Long term (current) use of systemic steroids: Secondary | ICD-10-CM | POA: Diagnosis not present

## 2018-03-12 DIAGNOSIS — Z9181 History of falling: Secondary | ICD-10-CM | POA: Diagnosis not present

## 2018-03-12 DIAGNOSIS — E7800 Pure hypercholesterolemia, unspecified: Secondary | ICD-10-CM | POA: Diagnosis not present

## 2018-03-12 DIAGNOSIS — I129 Hypertensive chronic kidney disease with stage 1 through stage 4 chronic kidney disease, or unspecified chronic kidney disease: Secondary | ICD-10-CM | POA: Diagnosis not present

## 2018-03-12 DIAGNOSIS — Z452 Encounter for adjustment and management of vascular access device: Secondary | ICD-10-CM | POA: Diagnosis not present

## 2018-03-12 DIAGNOSIS — J449 Chronic obstructive pulmonary disease, unspecified: Secondary | ICD-10-CM | POA: Diagnosis not present

## 2018-03-12 DIAGNOSIS — Z7982 Long term (current) use of aspirin: Secondary | ICD-10-CM | POA: Diagnosis not present

## 2018-03-12 DIAGNOSIS — I739 Peripheral vascular disease, unspecified: Secondary | ICD-10-CM | POA: Diagnosis not present

## 2018-03-12 DIAGNOSIS — Z792 Long term (current) use of antibiotics: Secondary | ICD-10-CM | POA: Diagnosis not present

## 2018-03-12 DIAGNOSIS — N183 Chronic kidney disease, stage 3 (moderate): Secondary | ICD-10-CM | POA: Diagnosis not present

## 2018-03-12 DIAGNOSIS — N400 Enlarged prostate without lower urinary tract symptoms: Secondary | ICD-10-CM | POA: Diagnosis not present

## 2018-03-12 DIAGNOSIS — T8789 Other complications of amputation stump: Secondary | ICD-10-CM | POA: Diagnosis not present

## 2018-03-12 DIAGNOSIS — I2510 Atherosclerotic heart disease of native coronary artery without angina pectoris: Secondary | ICD-10-CM | POA: Diagnosis not present

## 2018-03-12 DIAGNOSIS — K219 Gastro-esophageal reflux disease without esophagitis: Secondary | ICD-10-CM | POA: Diagnosis not present

## 2018-03-12 DIAGNOSIS — Z7952 Long term (current) use of systemic steroids: Secondary | ICD-10-CM | POA: Diagnosis not present

## 2018-03-14 ENCOUNTER — Encounter (HOSPITAL_BASED_OUTPATIENT_CLINIC_OR_DEPARTMENT_OTHER): Payer: Medicare Other | Attending: Internal Medicine

## 2018-03-14 DIAGNOSIS — I10 Essential (primary) hypertension: Secondary | ICD-10-CM | POA: Insufficient documentation

## 2018-03-14 DIAGNOSIS — I739 Peripheral vascular disease, unspecified: Secondary | ICD-10-CM | POA: Diagnosis not present

## 2018-03-14 DIAGNOSIS — L97512 Non-pressure chronic ulcer of other part of right foot with fat layer exposed: Secondary | ICD-10-CM | POA: Diagnosis not present

## 2018-03-14 DIAGNOSIS — Z89431 Acquired absence of right foot: Secondary | ICD-10-CM | POA: Diagnosis not present

## 2018-03-14 DIAGNOSIS — T8189XA Other complications of procedures, not elsewhere classified, initial encounter: Secondary | ICD-10-CM | POA: Insufficient documentation

## 2018-03-14 DIAGNOSIS — Y835 Amputation of limb(s) as the cause of abnormal reaction of the patient, or of later complication, without mention of misadventure at the time of the procedure: Secondary | ICD-10-CM | POA: Insufficient documentation

## 2018-03-14 DIAGNOSIS — I2510 Atherosclerotic heart disease of native coronary artery without angina pectoris: Secondary | ICD-10-CM | POA: Insufficient documentation

## 2018-03-17 DIAGNOSIS — N183 Chronic kidney disease, stage 3 (moderate): Secondary | ICD-10-CM | POA: Diagnosis not present

## 2018-03-17 DIAGNOSIS — Z792 Long term (current) use of antibiotics: Secondary | ICD-10-CM | POA: Diagnosis not present

## 2018-03-17 DIAGNOSIS — Z452 Encounter for adjustment and management of vascular access device: Secondary | ICD-10-CM | POA: Diagnosis not present

## 2018-03-17 DIAGNOSIS — J449 Chronic obstructive pulmonary disease, unspecified: Secondary | ICD-10-CM | POA: Diagnosis not present

## 2018-03-17 DIAGNOSIS — N400 Enlarged prostate without lower urinary tract symptoms: Secondary | ICD-10-CM | POA: Diagnosis not present

## 2018-03-17 DIAGNOSIS — Z7952 Long term (current) use of systemic steroids: Secondary | ICD-10-CM | POA: Diagnosis not present

## 2018-03-17 DIAGNOSIS — T8789 Other complications of amputation stump: Secondary | ICD-10-CM | POA: Diagnosis not present

## 2018-03-17 DIAGNOSIS — I739 Peripheral vascular disease, unspecified: Secondary | ICD-10-CM | POA: Diagnosis not present

## 2018-03-17 DIAGNOSIS — I2510 Atherosclerotic heart disease of native coronary artery without angina pectoris: Secondary | ICD-10-CM | POA: Diagnosis not present

## 2018-03-17 DIAGNOSIS — Z9181 History of falling: Secondary | ICD-10-CM | POA: Diagnosis not present

## 2018-03-17 DIAGNOSIS — Z7982 Long term (current) use of aspirin: Secondary | ICD-10-CM | POA: Diagnosis not present

## 2018-03-17 DIAGNOSIS — E7800 Pure hypercholesterolemia, unspecified: Secondary | ICD-10-CM | POA: Diagnosis not present

## 2018-03-17 DIAGNOSIS — I129 Hypertensive chronic kidney disease with stage 1 through stage 4 chronic kidney disease, or unspecified chronic kidney disease: Secondary | ICD-10-CM | POA: Diagnosis not present

## 2018-03-17 DIAGNOSIS — K219 Gastro-esophageal reflux disease without esophagitis: Secondary | ICD-10-CM | POA: Diagnosis not present

## 2018-03-19 DIAGNOSIS — Z452 Encounter for adjustment and management of vascular access device: Secondary | ICD-10-CM | POA: Diagnosis not present

## 2018-03-19 DIAGNOSIS — Z7982 Long term (current) use of aspirin: Secondary | ICD-10-CM | POA: Diagnosis not present

## 2018-03-19 DIAGNOSIS — T8789 Other complications of amputation stump: Secondary | ICD-10-CM | POA: Diagnosis not present

## 2018-03-19 DIAGNOSIS — Z7952 Long term (current) use of systemic steroids: Secondary | ICD-10-CM | POA: Diagnosis not present

## 2018-03-19 DIAGNOSIS — J449 Chronic obstructive pulmonary disease, unspecified: Secondary | ICD-10-CM | POA: Diagnosis not present

## 2018-03-19 DIAGNOSIS — N400 Enlarged prostate without lower urinary tract symptoms: Secondary | ICD-10-CM | POA: Diagnosis not present

## 2018-03-19 DIAGNOSIS — N183 Chronic kidney disease, stage 3 (moderate): Secondary | ICD-10-CM | POA: Diagnosis not present

## 2018-03-19 DIAGNOSIS — E7800 Pure hypercholesterolemia, unspecified: Secondary | ICD-10-CM | POA: Diagnosis not present

## 2018-03-19 DIAGNOSIS — Z9181 History of falling: Secondary | ICD-10-CM | POA: Diagnosis not present

## 2018-03-19 DIAGNOSIS — K219 Gastro-esophageal reflux disease without esophagitis: Secondary | ICD-10-CM | POA: Diagnosis not present

## 2018-03-19 DIAGNOSIS — I739 Peripheral vascular disease, unspecified: Secondary | ICD-10-CM | POA: Diagnosis not present

## 2018-03-19 DIAGNOSIS — I2510 Atherosclerotic heart disease of native coronary artery without angina pectoris: Secondary | ICD-10-CM | POA: Diagnosis not present

## 2018-03-19 DIAGNOSIS — Z792 Long term (current) use of antibiotics: Secondary | ICD-10-CM | POA: Diagnosis not present

## 2018-03-19 DIAGNOSIS — I129 Hypertensive chronic kidney disease with stage 1 through stage 4 chronic kidney disease, or unspecified chronic kidney disease: Secondary | ICD-10-CM | POA: Diagnosis not present

## 2018-03-21 DIAGNOSIS — I10 Essential (primary) hypertension: Secondary | ICD-10-CM | POA: Diagnosis not present

## 2018-03-21 DIAGNOSIS — I2510 Atherosclerotic heart disease of native coronary artery without angina pectoris: Secondary | ICD-10-CM | POA: Diagnosis not present

## 2018-03-21 DIAGNOSIS — Z89431 Acquired absence of right foot: Secondary | ICD-10-CM | POA: Diagnosis not present

## 2018-03-21 DIAGNOSIS — T8189XA Other complications of procedures, not elsewhere classified, initial encounter: Secondary | ICD-10-CM | POA: Diagnosis not present

## 2018-03-21 DIAGNOSIS — I739 Peripheral vascular disease, unspecified: Secondary | ICD-10-CM | POA: Diagnosis not present

## 2018-03-21 DIAGNOSIS — I70235 Atherosclerosis of native arteries of right leg with ulceration of other part of foot: Secondary | ICD-10-CM | POA: Diagnosis not present

## 2018-03-21 DIAGNOSIS — L97512 Non-pressure chronic ulcer of other part of right foot with fat layer exposed: Secondary | ICD-10-CM | POA: Diagnosis not present

## 2018-03-24 DIAGNOSIS — Z9181 History of falling: Secondary | ICD-10-CM | POA: Diagnosis not present

## 2018-03-24 DIAGNOSIS — I2510 Atherosclerotic heart disease of native coronary artery without angina pectoris: Secondary | ICD-10-CM | POA: Diagnosis not present

## 2018-03-24 DIAGNOSIS — N183 Chronic kidney disease, stage 3 (moderate): Secondary | ICD-10-CM | POA: Diagnosis not present

## 2018-03-24 DIAGNOSIS — E7800 Pure hypercholesterolemia, unspecified: Secondary | ICD-10-CM | POA: Diagnosis not present

## 2018-03-24 DIAGNOSIS — Z7952 Long term (current) use of systemic steroids: Secondary | ICD-10-CM | POA: Diagnosis not present

## 2018-03-24 DIAGNOSIS — N400 Enlarged prostate without lower urinary tract symptoms: Secondary | ICD-10-CM | POA: Diagnosis not present

## 2018-03-24 DIAGNOSIS — K219 Gastro-esophageal reflux disease without esophagitis: Secondary | ICD-10-CM | POA: Diagnosis not present

## 2018-03-24 DIAGNOSIS — I739 Peripheral vascular disease, unspecified: Secondary | ICD-10-CM | POA: Diagnosis not present

## 2018-03-24 DIAGNOSIS — J449 Chronic obstructive pulmonary disease, unspecified: Secondary | ICD-10-CM | POA: Diagnosis not present

## 2018-03-24 DIAGNOSIS — Z792 Long term (current) use of antibiotics: Secondary | ICD-10-CM | POA: Diagnosis not present

## 2018-03-24 DIAGNOSIS — Z7982 Long term (current) use of aspirin: Secondary | ICD-10-CM | POA: Diagnosis not present

## 2018-03-24 DIAGNOSIS — Z452 Encounter for adjustment and management of vascular access device: Secondary | ICD-10-CM | POA: Diagnosis not present

## 2018-03-24 DIAGNOSIS — I129 Hypertensive chronic kidney disease with stage 1 through stage 4 chronic kidney disease, or unspecified chronic kidney disease: Secondary | ICD-10-CM | POA: Diagnosis not present

## 2018-03-24 DIAGNOSIS — T8789 Other complications of amputation stump: Secondary | ICD-10-CM | POA: Diagnosis not present

## 2018-03-26 DIAGNOSIS — I739 Peripheral vascular disease, unspecified: Secondary | ICD-10-CM | POA: Diagnosis not present

## 2018-03-26 DIAGNOSIS — K219 Gastro-esophageal reflux disease without esophagitis: Secondary | ICD-10-CM | POA: Diagnosis not present

## 2018-03-26 DIAGNOSIS — Z792 Long term (current) use of antibiotics: Secondary | ICD-10-CM | POA: Diagnosis not present

## 2018-03-26 DIAGNOSIS — J449 Chronic obstructive pulmonary disease, unspecified: Secondary | ICD-10-CM | POA: Diagnosis not present

## 2018-03-26 DIAGNOSIS — N183 Chronic kidney disease, stage 3 (moderate): Secondary | ICD-10-CM | POA: Diagnosis not present

## 2018-03-26 DIAGNOSIS — E7800 Pure hypercholesterolemia, unspecified: Secondary | ICD-10-CM | POA: Diagnosis not present

## 2018-03-26 DIAGNOSIS — Z7982 Long term (current) use of aspirin: Secondary | ICD-10-CM | POA: Diagnosis not present

## 2018-03-26 DIAGNOSIS — Z7952 Long term (current) use of systemic steroids: Secondary | ICD-10-CM | POA: Diagnosis not present

## 2018-03-26 DIAGNOSIS — T8789 Other complications of amputation stump: Secondary | ICD-10-CM | POA: Diagnosis not present

## 2018-03-26 DIAGNOSIS — Z452 Encounter for adjustment and management of vascular access device: Secondary | ICD-10-CM | POA: Diagnosis not present

## 2018-03-26 DIAGNOSIS — I2510 Atherosclerotic heart disease of native coronary artery without angina pectoris: Secondary | ICD-10-CM | POA: Diagnosis not present

## 2018-03-26 DIAGNOSIS — I129 Hypertensive chronic kidney disease with stage 1 through stage 4 chronic kidney disease, or unspecified chronic kidney disease: Secondary | ICD-10-CM | POA: Diagnosis not present

## 2018-03-26 DIAGNOSIS — N400 Enlarged prostate without lower urinary tract symptoms: Secondary | ICD-10-CM | POA: Diagnosis not present

## 2018-03-26 DIAGNOSIS — Z9181 History of falling: Secondary | ICD-10-CM | POA: Diagnosis not present

## 2018-03-28 ENCOUNTER — Encounter: Payer: Self-pay | Admitting: Podiatry

## 2018-03-28 ENCOUNTER — Ambulatory Visit: Payer: Medicare Other | Admitting: Podiatry

## 2018-03-28 VITALS — BP 147/74 | HR 85 | Resp 16

## 2018-03-28 DIAGNOSIS — L97511 Non-pressure chronic ulcer of other part of right foot limited to breakdown of skin: Secondary | ICD-10-CM | POA: Diagnosis not present

## 2018-03-28 NOTE — Progress Notes (Signed)
  Subjective:  Patient ID: Derrick [name], male    DOB: 1932/02/15,  MRN: [id number]  Chief Complaint  Patient presents with  . Wound Check    Follow-up; Right foot; lateral side; pt stated, "Most of the time, my foot feels okay; I am not in any pain today"   84 y.o. male returns for wound care. Believes the wound to be improving. Being seen at wound care center. Denies N/V/F/Ch.  Objective:   Vitals:   03/28/18 1309  BP: (!) 147/74  Pulse: 85  Resp: 16   General AA&O x3. Normal mood and affect.  Vascular Foot warm to touch.  Neurologic Sensation grossly diminished.  Dermatologic (Wound) Wound Location: right lateral forefoot Wound Measurement: 0.7*0.5 superficial Wound Base: Granular/Healthy Peri-wound: Calloused Exudate: None: wound tissue dry    Wound progress: Improved since last check.  Orthopedic: No pain to palpation either foot.   Assessment & Plan:  Patient was evaluated and treated and all questions answered.  Ulcer R foot -Debridement as below. -Dressed with medihoney, DSD.  Procedure: Selective Debridement of Wound Rationale: Removal of devitalized tissue from the wound to promote healing.  Pre-Debridement Wound Measurements: 0.7 cm x 0.5 cm x 0.1 cm  Post-Debridement Wound Measurements: same as pre-debridement. Type of Debridement: Selective Tissue Removed: Devitalized soft-tissue Instrumentation: 3-0 mm dermal curette Dressing: Dry, sterile, compression dressing. Disposition: Patient tolerated procedure well. Patient to return in 1 week for follow-up.  Return if symptoms worsen or fail to improve.

## 2018-03-31 DIAGNOSIS — T8789 Other complications of amputation stump: Secondary | ICD-10-CM | POA: Diagnosis not present

## 2018-03-31 DIAGNOSIS — T8189XA Other complications of procedures, not elsewhere classified, initial encounter: Secondary | ICD-10-CM | POA: Diagnosis not present

## 2018-03-31 DIAGNOSIS — Z7982 Long term (current) use of aspirin: Secondary | ICD-10-CM | POA: Diagnosis not present

## 2018-03-31 DIAGNOSIS — E7800 Pure hypercholesterolemia, unspecified: Secondary | ICD-10-CM | POA: Diagnosis not present

## 2018-03-31 DIAGNOSIS — Z9181 History of falling: Secondary | ICD-10-CM | POA: Diagnosis not present

## 2018-03-31 DIAGNOSIS — Z452 Encounter for adjustment and management of vascular access device: Secondary | ICD-10-CM | POA: Diagnosis not present

## 2018-03-31 DIAGNOSIS — Z792 Long term (current) use of antibiotics: Secondary | ICD-10-CM | POA: Diagnosis not present

## 2018-03-31 DIAGNOSIS — I2510 Atherosclerotic heart disease of native coronary artery without angina pectoris: Secondary | ICD-10-CM | POA: Diagnosis not present

## 2018-03-31 DIAGNOSIS — N400 Enlarged prostate without lower urinary tract symptoms: Secondary | ICD-10-CM | POA: Diagnosis not present

## 2018-03-31 DIAGNOSIS — I739 Peripheral vascular disease, unspecified: Secondary | ICD-10-CM | POA: Diagnosis not present

## 2018-03-31 DIAGNOSIS — I129 Hypertensive chronic kidney disease with stage 1 through stage 4 chronic kidney disease, or unspecified chronic kidney disease: Secondary | ICD-10-CM | POA: Diagnosis not present

## 2018-03-31 DIAGNOSIS — Z7952 Long term (current) use of systemic steroids: Secondary | ICD-10-CM | POA: Diagnosis not present

## 2018-03-31 DIAGNOSIS — K219 Gastro-esophageal reflux disease without esophagitis: Secondary | ICD-10-CM | POA: Diagnosis not present

## 2018-03-31 DIAGNOSIS — J449 Chronic obstructive pulmonary disease, unspecified: Secondary | ICD-10-CM | POA: Diagnosis not present

## 2018-03-31 DIAGNOSIS — N183 Chronic kidney disease, stage 3 (moderate): Secondary | ICD-10-CM | POA: Diagnosis not present

## 2018-04-02 DIAGNOSIS — N183 Chronic kidney disease, stage 3 (moderate): Secondary | ICD-10-CM | POA: Diagnosis not present

## 2018-04-02 DIAGNOSIS — Z452 Encounter for adjustment and management of vascular access device: Secondary | ICD-10-CM | POA: Diagnosis not present

## 2018-04-02 DIAGNOSIS — I739 Peripheral vascular disease, unspecified: Secondary | ICD-10-CM | POA: Diagnosis not present

## 2018-04-02 DIAGNOSIS — Z7982 Long term (current) use of aspirin: Secondary | ICD-10-CM | POA: Diagnosis not present

## 2018-04-02 DIAGNOSIS — Z7952 Long term (current) use of systemic steroids: Secondary | ICD-10-CM | POA: Diagnosis not present

## 2018-04-02 DIAGNOSIS — Z9181 History of falling: Secondary | ICD-10-CM | POA: Diagnosis not present

## 2018-04-02 DIAGNOSIS — J449 Chronic obstructive pulmonary disease, unspecified: Secondary | ICD-10-CM | POA: Diagnosis not present

## 2018-04-02 DIAGNOSIS — K219 Gastro-esophageal reflux disease without esophagitis: Secondary | ICD-10-CM | POA: Diagnosis not present

## 2018-04-02 DIAGNOSIS — E7800 Pure hypercholesterolemia, unspecified: Secondary | ICD-10-CM | POA: Diagnosis not present

## 2018-04-02 DIAGNOSIS — I129 Hypertensive chronic kidney disease with stage 1 through stage 4 chronic kidney disease, or unspecified chronic kidney disease: Secondary | ICD-10-CM | POA: Diagnosis not present

## 2018-04-02 DIAGNOSIS — N400 Enlarged prostate without lower urinary tract symptoms: Secondary | ICD-10-CM | POA: Diagnosis not present

## 2018-04-02 DIAGNOSIS — I2510 Atherosclerotic heart disease of native coronary artery without angina pectoris: Secondary | ICD-10-CM | POA: Diagnosis not present

## 2018-04-02 DIAGNOSIS — T8789 Other complications of amputation stump: Secondary | ICD-10-CM | POA: Diagnosis not present

## 2018-04-02 DIAGNOSIS — Z792 Long term (current) use of antibiotics: Secondary | ICD-10-CM | POA: Diagnosis not present

## 2018-04-03 DIAGNOSIS — Z792 Long term (current) use of antibiotics: Secondary | ICD-10-CM | POA: Diagnosis not present

## 2018-04-03 DIAGNOSIS — T8789 Other complications of amputation stump: Secondary | ICD-10-CM | POA: Diagnosis not present

## 2018-04-03 DIAGNOSIS — Z9181 History of falling: Secondary | ICD-10-CM | POA: Diagnosis not present

## 2018-04-03 DIAGNOSIS — K219 Gastro-esophageal reflux disease without esophagitis: Secondary | ICD-10-CM | POA: Diagnosis not present

## 2018-04-03 DIAGNOSIS — N400 Enlarged prostate without lower urinary tract symptoms: Secondary | ICD-10-CM | POA: Diagnosis not present

## 2018-04-03 DIAGNOSIS — I2510 Atherosclerotic heart disease of native coronary artery without angina pectoris: Secondary | ICD-10-CM | POA: Diagnosis not present

## 2018-04-03 DIAGNOSIS — E7800 Pure hypercholesterolemia, unspecified: Secondary | ICD-10-CM | POA: Diagnosis not present

## 2018-04-03 DIAGNOSIS — I739 Peripheral vascular disease, unspecified: Secondary | ICD-10-CM | POA: Diagnosis not present

## 2018-04-03 DIAGNOSIS — Z7952 Long term (current) use of systemic steroids: Secondary | ICD-10-CM | POA: Diagnosis not present

## 2018-04-03 DIAGNOSIS — J449 Chronic obstructive pulmonary disease, unspecified: Secondary | ICD-10-CM | POA: Diagnosis not present

## 2018-04-03 DIAGNOSIS — N183 Chronic kidney disease, stage 3 (moderate): Secondary | ICD-10-CM | POA: Diagnosis not present

## 2018-04-03 DIAGNOSIS — Z7982 Long term (current) use of aspirin: Secondary | ICD-10-CM | POA: Diagnosis not present

## 2018-04-03 DIAGNOSIS — I129 Hypertensive chronic kidney disease with stage 1 through stage 4 chronic kidney disease, or unspecified chronic kidney disease: Secondary | ICD-10-CM | POA: Diagnosis not present

## 2018-04-03 DIAGNOSIS — Z452 Encounter for adjustment and management of vascular access device: Secondary | ICD-10-CM | POA: Diagnosis not present

## 2018-04-04 DIAGNOSIS — I10 Essential (primary) hypertension: Secondary | ICD-10-CM | POA: Diagnosis not present

## 2018-04-04 DIAGNOSIS — I2510 Atherosclerotic heart disease of native coronary artery without angina pectoris: Secondary | ICD-10-CM | POA: Diagnosis not present

## 2018-04-04 DIAGNOSIS — Z89431 Acquired absence of right foot: Secondary | ICD-10-CM | POA: Diagnosis not present

## 2018-04-04 DIAGNOSIS — T8189XA Other complications of procedures, not elsewhere classified, initial encounter: Secondary | ICD-10-CM | POA: Diagnosis not present

## 2018-04-04 DIAGNOSIS — L97512 Non-pressure chronic ulcer of other part of right foot with fat layer exposed: Secondary | ICD-10-CM | POA: Diagnosis not present

## 2018-04-04 DIAGNOSIS — I739 Peripheral vascular disease, unspecified: Secondary | ICD-10-CM | POA: Diagnosis not present

## 2018-04-07 DIAGNOSIS — N183 Chronic kidney disease, stage 3 (moderate): Secondary | ICD-10-CM | POA: Diagnosis not present

## 2018-04-07 DIAGNOSIS — Z792 Long term (current) use of antibiotics: Secondary | ICD-10-CM | POA: Diagnosis not present

## 2018-04-07 DIAGNOSIS — T8789 Other complications of amputation stump: Secondary | ICD-10-CM | POA: Diagnosis not present

## 2018-04-07 DIAGNOSIS — I2510 Atherosclerotic heart disease of native coronary artery without angina pectoris: Secondary | ICD-10-CM | POA: Diagnosis not present

## 2018-04-07 DIAGNOSIS — Z9181 History of falling: Secondary | ICD-10-CM | POA: Diagnosis not present

## 2018-04-07 DIAGNOSIS — I129 Hypertensive chronic kidney disease with stage 1 through stage 4 chronic kidney disease, or unspecified chronic kidney disease: Secondary | ICD-10-CM | POA: Diagnosis not present

## 2018-04-07 DIAGNOSIS — I739 Peripheral vascular disease, unspecified: Secondary | ICD-10-CM | POA: Diagnosis not present

## 2018-04-07 DIAGNOSIS — Z452 Encounter for adjustment and management of vascular access device: Secondary | ICD-10-CM | POA: Diagnosis not present

## 2018-04-07 DIAGNOSIS — J449 Chronic obstructive pulmonary disease, unspecified: Secondary | ICD-10-CM | POA: Diagnosis not present

## 2018-04-07 DIAGNOSIS — K219 Gastro-esophageal reflux disease without esophagitis: Secondary | ICD-10-CM | POA: Diagnosis not present

## 2018-04-07 DIAGNOSIS — E7800 Pure hypercholesterolemia, unspecified: Secondary | ICD-10-CM | POA: Diagnosis not present

## 2018-04-07 DIAGNOSIS — Z7982 Long term (current) use of aspirin: Secondary | ICD-10-CM | POA: Diagnosis not present

## 2018-04-07 DIAGNOSIS — Z7952 Long term (current) use of systemic steroids: Secondary | ICD-10-CM | POA: Diagnosis not present

## 2018-04-07 DIAGNOSIS — N400 Enlarged prostate without lower urinary tract symptoms: Secondary | ICD-10-CM | POA: Diagnosis not present

## 2018-04-09 DIAGNOSIS — N400 Enlarged prostate without lower urinary tract symptoms: Secondary | ICD-10-CM | POA: Diagnosis not present

## 2018-04-09 DIAGNOSIS — J449 Chronic obstructive pulmonary disease, unspecified: Secondary | ICD-10-CM | POA: Diagnosis not present

## 2018-04-09 DIAGNOSIS — Z792 Long term (current) use of antibiotics: Secondary | ICD-10-CM | POA: Diagnosis not present

## 2018-04-09 DIAGNOSIS — T8789 Other complications of amputation stump: Secondary | ICD-10-CM | POA: Diagnosis not present

## 2018-04-09 DIAGNOSIS — Z7952 Long term (current) use of systemic steroids: Secondary | ICD-10-CM | POA: Diagnosis not present

## 2018-04-09 DIAGNOSIS — Z7982 Long term (current) use of aspirin: Secondary | ICD-10-CM | POA: Diagnosis not present

## 2018-04-09 DIAGNOSIS — I2510 Atherosclerotic heart disease of native coronary artery without angina pectoris: Secondary | ICD-10-CM | POA: Diagnosis not present

## 2018-04-09 DIAGNOSIS — I129 Hypertensive chronic kidney disease with stage 1 through stage 4 chronic kidney disease, or unspecified chronic kidney disease: Secondary | ICD-10-CM | POA: Diagnosis not present

## 2018-04-09 DIAGNOSIS — Z452 Encounter for adjustment and management of vascular access device: Secondary | ICD-10-CM | POA: Diagnosis not present

## 2018-04-09 DIAGNOSIS — K219 Gastro-esophageal reflux disease without esophagitis: Secondary | ICD-10-CM | POA: Diagnosis not present

## 2018-04-09 DIAGNOSIS — N183 Chronic kidney disease, stage 3 (moderate): Secondary | ICD-10-CM | POA: Diagnosis not present

## 2018-04-09 DIAGNOSIS — I739 Peripheral vascular disease, unspecified: Secondary | ICD-10-CM | POA: Diagnosis not present

## 2018-04-09 DIAGNOSIS — E7800 Pure hypercholesterolemia, unspecified: Secondary | ICD-10-CM | POA: Diagnosis not present

## 2018-04-09 DIAGNOSIS — Z9181 History of falling: Secondary | ICD-10-CM | POA: Diagnosis not present

## 2018-04-15 DIAGNOSIS — Z7952 Long term (current) use of systemic steroids: Secondary | ICD-10-CM | POA: Diagnosis not present

## 2018-04-15 DIAGNOSIS — K219 Gastro-esophageal reflux disease without esophagitis: Secondary | ICD-10-CM | POA: Diagnosis not present

## 2018-04-15 DIAGNOSIS — J449 Chronic obstructive pulmonary disease, unspecified: Secondary | ICD-10-CM | POA: Diagnosis not present

## 2018-04-15 DIAGNOSIS — Z7982 Long term (current) use of aspirin: Secondary | ICD-10-CM | POA: Diagnosis not present

## 2018-04-15 DIAGNOSIS — Z452 Encounter for adjustment and management of vascular access device: Secondary | ICD-10-CM | POA: Diagnosis not present

## 2018-04-15 DIAGNOSIS — I739 Peripheral vascular disease, unspecified: Secondary | ICD-10-CM | POA: Diagnosis not present

## 2018-04-15 DIAGNOSIS — I2510 Atherosclerotic heart disease of native coronary artery without angina pectoris: Secondary | ICD-10-CM | POA: Diagnosis not present

## 2018-04-15 DIAGNOSIS — I129 Hypertensive chronic kidney disease with stage 1 through stage 4 chronic kidney disease, or unspecified chronic kidney disease: Secondary | ICD-10-CM | POA: Diagnosis not present

## 2018-04-15 DIAGNOSIS — Z792 Long term (current) use of antibiotics: Secondary | ICD-10-CM | POA: Diagnosis not present

## 2018-04-15 DIAGNOSIS — E7800 Pure hypercholesterolemia, unspecified: Secondary | ICD-10-CM | POA: Diagnosis not present

## 2018-04-15 DIAGNOSIS — N400 Enlarged prostate without lower urinary tract symptoms: Secondary | ICD-10-CM | POA: Diagnosis not present

## 2018-04-15 DIAGNOSIS — T8789 Other complications of amputation stump: Secondary | ICD-10-CM | POA: Diagnosis not present

## 2018-04-15 DIAGNOSIS — N183 Chronic kidney disease, stage 3 (moderate): Secondary | ICD-10-CM | POA: Diagnosis not present

## 2018-04-18 DIAGNOSIS — I129 Hypertensive chronic kidney disease with stage 1 through stage 4 chronic kidney disease, or unspecified chronic kidney disease: Secondary | ICD-10-CM | POA: Diagnosis not present

## 2018-04-18 DIAGNOSIS — J449 Chronic obstructive pulmonary disease, unspecified: Secondary | ICD-10-CM | POA: Diagnosis not present

## 2018-04-18 DIAGNOSIS — E7800 Pure hypercholesterolemia, unspecified: Secondary | ICD-10-CM | POA: Diagnosis not present

## 2018-04-18 DIAGNOSIS — Z7952 Long term (current) use of systemic steroids: Secondary | ICD-10-CM | POA: Diagnosis not present

## 2018-04-18 DIAGNOSIS — T8789 Other complications of amputation stump: Secondary | ICD-10-CM | POA: Diagnosis not present

## 2018-04-18 DIAGNOSIS — N183 Chronic kidney disease, stage 3 (moderate): Secondary | ICD-10-CM | POA: Diagnosis not present

## 2018-04-18 DIAGNOSIS — Z452 Encounter for adjustment and management of vascular access device: Secondary | ICD-10-CM | POA: Diagnosis not present

## 2018-04-18 DIAGNOSIS — Z7982 Long term (current) use of aspirin: Secondary | ICD-10-CM | POA: Diagnosis not present

## 2018-04-18 DIAGNOSIS — I739 Peripheral vascular disease, unspecified: Secondary | ICD-10-CM | POA: Diagnosis not present

## 2018-04-18 DIAGNOSIS — K219 Gastro-esophageal reflux disease without esophagitis: Secondary | ICD-10-CM | POA: Diagnosis not present

## 2018-04-18 DIAGNOSIS — I2510 Atherosclerotic heart disease of native coronary artery without angina pectoris: Secondary | ICD-10-CM | POA: Diagnosis not present

## 2018-04-18 DIAGNOSIS — N400 Enlarged prostate without lower urinary tract symptoms: Secondary | ICD-10-CM | POA: Diagnosis not present

## 2018-04-18 DIAGNOSIS — Z792 Long term (current) use of antibiotics: Secondary | ICD-10-CM | POA: Diagnosis not present

## 2018-04-22 ENCOUNTER — Ambulatory Visit: Payer: Medicare Other | Admitting: Cardiovascular Disease

## 2018-04-22 DIAGNOSIS — E7800 Pure hypercholesterolemia, unspecified: Secondary | ICD-10-CM | POA: Diagnosis not present

## 2018-04-22 DIAGNOSIS — Z792 Long term (current) use of antibiotics: Secondary | ICD-10-CM | POA: Diagnosis not present

## 2018-04-22 DIAGNOSIS — J449 Chronic obstructive pulmonary disease, unspecified: Secondary | ICD-10-CM | POA: Diagnosis not present

## 2018-04-22 DIAGNOSIS — T8789 Other complications of amputation stump: Secondary | ICD-10-CM | POA: Diagnosis not present

## 2018-04-22 DIAGNOSIS — Z7982 Long term (current) use of aspirin: Secondary | ICD-10-CM | POA: Diagnosis not present

## 2018-04-22 DIAGNOSIS — K219 Gastro-esophageal reflux disease without esophagitis: Secondary | ICD-10-CM | POA: Diagnosis not present

## 2018-04-22 DIAGNOSIS — N183 Chronic kidney disease, stage 3 (moderate): Secondary | ICD-10-CM | POA: Diagnosis not present

## 2018-04-22 DIAGNOSIS — Z452 Encounter for adjustment and management of vascular access device: Secondary | ICD-10-CM | POA: Diagnosis not present

## 2018-04-22 DIAGNOSIS — I2510 Atherosclerotic heart disease of native coronary artery without angina pectoris: Secondary | ICD-10-CM | POA: Diagnosis not present

## 2018-04-22 DIAGNOSIS — N400 Enlarged prostate without lower urinary tract symptoms: Secondary | ICD-10-CM | POA: Diagnosis not present

## 2018-04-22 DIAGNOSIS — Z7952 Long term (current) use of systemic steroids: Secondary | ICD-10-CM | POA: Diagnosis not present

## 2018-04-22 DIAGNOSIS — Z9181 History of falling: Secondary | ICD-10-CM | POA: Diagnosis not present

## 2018-04-22 DIAGNOSIS — I739 Peripheral vascular disease, unspecified: Secondary | ICD-10-CM | POA: Diagnosis not present

## 2018-04-22 DIAGNOSIS — I129 Hypertensive chronic kidney disease with stage 1 through stage 4 chronic kidney disease, or unspecified chronic kidney disease: Secondary | ICD-10-CM | POA: Diagnosis not present

## 2018-04-23 ENCOUNTER — Encounter: Payer: Self-pay | Admitting: *Deleted

## 2018-04-25 ENCOUNTER — Encounter (HOSPITAL_BASED_OUTPATIENT_CLINIC_OR_DEPARTMENT_OTHER): Payer: Medicare Other | Attending: Internal Medicine

## 2018-04-25 DIAGNOSIS — Z872 Personal history of diseases of the skin and subcutaneous tissue: Secondary | ICD-10-CM | POA: Diagnosis not present

## 2018-04-25 DIAGNOSIS — Z8546 Personal history of malignant neoplasm of prostate: Secondary | ICD-10-CM | POA: Insufficient documentation

## 2018-04-25 DIAGNOSIS — I70235 Atherosclerosis of native arteries of right leg with ulceration of other part of foot: Secondary | ICD-10-CM | POA: Diagnosis not present

## 2018-04-25 DIAGNOSIS — I129 Hypertensive chronic kidney disease with stage 1 through stage 4 chronic kidney disease, or unspecified chronic kidney disease: Secondary | ICD-10-CM | POA: Diagnosis not present

## 2018-04-25 DIAGNOSIS — N189 Chronic kidney disease, unspecified: Secondary | ICD-10-CM | POA: Insufficient documentation

## 2018-04-25 DIAGNOSIS — I70201 Unspecified atherosclerosis of native arteries of extremities, right leg: Secondary | ICD-10-CM | POA: Diagnosis not present

## 2018-04-25 DIAGNOSIS — Z905 Acquired absence of kidney: Secondary | ICD-10-CM | POA: Diagnosis not present

## 2018-04-25 DIAGNOSIS — J449 Chronic obstructive pulmonary disease, unspecified: Secondary | ICD-10-CM | POA: Diagnosis not present

## 2018-04-25 DIAGNOSIS — I2510 Atherosclerotic heart disease of native coronary artery without angina pectoris: Secondary | ICD-10-CM | POA: Diagnosis not present

## 2018-04-25 DIAGNOSIS — Z09 Encounter for follow-up examination after completed treatment for conditions other than malignant neoplasm: Secondary | ICD-10-CM | POA: Insufficient documentation

## 2018-04-25 DIAGNOSIS — L97511 Non-pressure chronic ulcer of other part of right foot limited to breakdown of skin: Secondary | ICD-10-CM | POA: Diagnosis not present

## 2018-04-29 ENCOUNTER — Encounter (HOSPITAL_COMMUNITY): Payer: Self-pay

## 2018-04-29 ENCOUNTER — Emergency Department (HOSPITAL_COMMUNITY): Payer: Medicare Other

## 2018-04-29 ENCOUNTER — Other Ambulatory Visit: Payer: Self-pay

## 2018-04-29 ENCOUNTER — Emergency Department (HOSPITAL_COMMUNITY)
Admission: EM | Admit: 2018-04-29 | Discharge: 2018-04-29 | Disposition: A | Discharge status: Home / Self Care | Payer: Medicare Other | Attending: Emergency Medicine | Admitting: Emergency Medicine

## 2018-04-29 DIAGNOSIS — Z7902 Long term (current) use of antithrombotics/antiplatelets: Secondary | ICD-10-CM | POA: Diagnosis not present

## 2018-04-29 DIAGNOSIS — Z7982 Long term (current) use of aspirin: Secondary | ICD-10-CM | POA: Insufficient documentation

## 2018-04-29 DIAGNOSIS — Z79899 Other long term (current) drug therapy: Secondary | ICD-10-CM | POA: Diagnosis not present

## 2018-04-29 DIAGNOSIS — Z8546 Personal history of malignant neoplasm of prostate: Secondary | ICD-10-CM | POA: Insufficient documentation

## 2018-04-29 DIAGNOSIS — I129 Hypertensive chronic kidney disease with stage 1 through stage 4 chronic kidney disease, or unspecified chronic kidney disease: Secondary | ICD-10-CM | POA: Diagnosis not present

## 2018-04-29 DIAGNOSIS — J449 Chronic obstructive pulmonary disease, unspecified: Secondary | ICD-10-CM | POA: Diagnosis not present

## 2018-04-29 DIAGNOSIS — M542 Cervicalgia: Secondary | ICD-10-CM | POA: Diagnosis not present

## 2018-04-29 DIAGNOSIS — N183 Chronic kidney disease, stage 3 (moderate): Secondary | ICD-10-CM | POA: Insufficient documentation

## 2018-04-29 DIAGNOSIS — I2510 Atherosclerotic heart disease of native coronary artery without angina pectoris: Secondary | ICD-10-CM | POA: Diagnosis not present

## 2018-04-29 DIAGNOSIS — M47812 Spondylosis without myelopathy or radiculopathy, cervical region: Secondary | ICD-10-CM | POA: Insufficient documentation

## 2018-04-29 DIAGNOSIS — Z87891 Personal history of nicotine dependence: Secondary | ICD-10-CM | POA: Insufficient documentation

## 2018-04-29 MED ORDER — PREDNISONE 20 MG PO TABS
60.0000 mg | ORAL_TABLET | Freq: Once | ORAL | Status: AC
  Administered 2018-04-29: 60 mg via ORAL
  Filled 2018-04-29: qty 3

## 2018-04-29 MED ORDER — PREDNISONE 20 MG PO TABS: 20.0000 mg | ORAL_TABLET | Freq: Two times a day (BID) | ORAL | 0 refills | Status: DC

## 2018-04-29 MED ORDER — DIAZEPAM 2 MG PO TABS
2.0000 mg | ORAL_TABLET | Freq: Once | ORAL | Status: AC
  Administered 2018-04-29: 2 mg via ORAL
  Filled 2018-04-29: qty 1

## 2018-04-29 MED ORDER — KETOROLAC TROMETHAMINE 30 MG/ML IJ SOLN
30.0000 mg | Freq: Once | INTRAMUSCULAR | Status: AC
  Administered 2018-04-29: 30 mg via INTRAMUSCULAR
  Filled 2018-04-29: qty 1

## 2018-04-29 MED ORDER — TRAMADOL HCL 50 MG PO TABS: 50.0000 mg | ORAL_TABLET | Freq: Four times a day (QID) | ORAL | 0 refills | Status: DC | PRN

## 2018-04-29 NOTE — ED Triage Notes (Signed)
Pt reports waking up today with a very sore neck. Endorses somewhat chronic pain, but today was unable to move neck much, especially on right side. Denies injury, but states he did mow the yard yesterday. Minimal movement elicits pain.  Pt A/Ox4, ambulatory. Took something for pain at home but minimal relief.

## 2018-04-29 NOTE — ED Provider Notes (Signed)
[facility] DEPT Provider Note   CSN: [id number] Arrival date & time: 04/29/18  1244     History   Chief Complaint Chief Complaint  Patient presents with  . Neck Pain    HPI Derrick [name] is a 84 y.o. male.  HPI   Presents for evaluation of neck pain, which was first noticed this morning with persistent pain, but has been present previously as an intermittent pain.  No recent trauma.  He slept normally last night.  He denies fever, chills, nausea, vomiting, weakness, dizziness, trouble walking, or back pain.  There are no other known modifying factors.  Past Medical History:  Diagnosis Date  . Anemia   . Anxiety   . Arthritis, rheumatoid ([place])   . BPH (benign prostatic hyperplasia)   . CAD (coronary artery disease)   . CKD (chronic kidney disease)   . COPD (chronic obstructive pulmonary disease) ([place])   . Depression   . Diverticulosis   . Elevated PSA   . Gangrene ([place])    of toe right foot  . GERD (gastroesophageal reflux disease)   . Herpes simplex labialis   . Hiatal hernia   . High cholesterol   . History of colonic polyps   . Hypertension   . Insomnia   . Lacunar infarction ([place])   . Memory loss   . Obesity   . Panic attacks   . Prostate cancer ([place])   . Renal cell cancer ([place])    s/p nephrectomy in 2000  . Seropositive rheumatoid arthritis ([place])   . Spermatocele   . Tinnitus   . Tremor of both hands     Patient Active Problem List   Diagnosis Date Noted  . Ulcer of right foot limited to breakdown of skin ([place])   . PAD (peripheral artery disease) ([place]) 09/23/2017  . Syncope and collapse 09/19/2017  . Osteomyelitis ([place])   . Malaise 09/09/2017  . Anorexia 09/09/2017  . Hypotension 09/09/2017  . Volume depletion 09/09/2017  . AKI (acute kidney injury) ([place]) 09/09/2017  . Syncope 09/09/2017  . Pain of right great toe 06/12/2017  . Hyponatremia 06/12/2017  . COPD (chronic obstructive pulmonary disease) ([place])   .  Lacunar infarction ([place])   . Gangrene of toe of right foot ([place])   . Pre-syncope 11/21/2016  . Dyspnea on exertion 03/03/2016  . H/O unilateral Rt nephrectomy 03/03/2016  . Asymmetrical right sensorineural hearing loss 02/24/2016  . Bilateral hearing loss 02/24/2016  . Bilateral impacted cerumen 02/24/2016  . Arrhythmia 11/09/2015  . CAD (coronary artery disease) 11/09/2015  . Abnormal ECG 10/22/2013  . Dyspnea 10/22/2013  . Chest pain at rest 11/18/2011  . Unstable angina ([place]) 11/18/2011  . Thrombocytopenia ([place]) 11/18/2011  . Anemia 11/18/2011  . CKD (chronic kidney disease), stage III ([place]) 11/18/2011  . Hypertension   . High cholesterol   . GERD (gastroesophageal reflux disease)   . BPH (benign prostatic hyperplasia)     Past Surgical History:  Procedure Laterality Date  . AMPUTATION TOE Right 06/13/2017   Procedure: AMPUTATION TOE 1st and 2nd;  Surgeon: [name], DPM;  Location: [facility];  Service: Podiatry;  Laterality: Right;  . AMPUTATION TOE    . BACK SURGERY    . COLONOSCOPY    . GRAFT APPLICATION Right 7/78/2423   Procedure: EPIFIX GRAFT APPLICATION OF RIGHT FOOT;  Surgeon: [name], DPM;  Location: [facility];  Service: Podiatry;  Laterality: Right;  . I&D EXTREMITY Right 09/13/2017  Procedure: IRRIGATION AND DEBRIDEMENT AND BONE BIOPSY;  Surgeon: [name], DPM;  Location: [facility];  Service: Podiatry;  Laterality: Right;  . INCISION AND DRAINAGE OF WOUND Right 12/18/2017   Procedure: IRRIGATION AND DEBRIDEMENT WOUND ON RIGHT FOOT;  Surgeon: [name], DPM;  Location: [facility];  Service: Podiatry;  Laterality: Right;  . IR ANGIOGRAM EXTREMITY RIGHT  05/30/2017  . IR ANGIOGRAM EXTREMITY RIGHT  08/08/2017  . IR ANGIOGRAM SELECTIVE EACH ADDITIONAL VESSEL  08/08/2017  . IR ANGIOGRAM SELECTIVE EACH ADDITIONAL VESSEL  08/08/2017  . IR RADIOLOGIST EVAL & MGMT  05/16/2017  . IR TIB-PERO ART PTA MOD SED  05/30/2017  . IR TIB-PERO ART PTA MOD SED  08/08/2017  . IR  TIB-PERO ART UNI PTA EA ADD VESSEL MOD SED  05/30/2017  . IR TIB-PERO ART UNI PTA EA ADD VESSEL MOD SED  08/08/2017  . IR US GUIDE VASC ACCESS RIGHT  05/30/2017  . IR US GUIDE VASC ACCESS RIGHT  08/08/2017  . LEFT HEART CATHETERIZATION WITH CORONARY ANGIOGRAM N/A 11/19/2011   Procedure: LEFT HEART CATHETERIZATION WITH CORONARY ANGIOGRAM;  Surgeon: [name], MD;  Location: [facility] CATH LAB;  Service: Cardiovascular;  Laterality: N/A;  . NEPHRECTOMY  10/1998  . TRANSMETATARSAL AMPUTATION Right 08/14/2017   Procedure: TRANSMETATARSAL AMPUTATION;  Surgeon: [name], DPM;  Location: [facility];  Service: Podiatry;  Laterality: Right;        Home Medications    Prior to Admission medications   Medication Sig Start Date End Date Taking? Authorizing Provider  amLODipine (NORVASC) 10 MG tablet Take 10 mg by mouth daily.   Yes [provider]  aspirin EC 81 MG tablet Take 81 mg by mouth at bedtime.    Yes [provider]  clopidogrel (PLAVIX) 75 MG tablet Take 75 mg by mouth daily.   Yes [provider]  finasteride (PROSCAR) 5 MG tablet Take 5 mg by mouth daily.   Yes [provider]  Multiple Vitamin (MULTIVITAMIN WITH MINERALS) TABS tablet Take 1 tablet by mouth daily.   Yes [provider]  Omega-3 Fatty Acids (FISH OIL) 1200 MG CAPS Take 2,400 mg by mouth daily.    Yes [provider]  pantoprazole (PROTONIX) 40 MG tablet Take 40 mg by mouth 2 (two) times daily.    Yes [provider]  vitamin B-12 (CYANOCOBALAMIN) 1000 MCG tablet Take 1,000 mcg by mouth daily.   Yes [provider]  amLODipine (NORVASC) 5 MG tablet Take 1 tablet (5 mg total) by mouth daily. Patient not taking: Reported on 04/29/2018 09/30/17   Croitoru, Mihai, MD  cephALEXin (KEFLEX) 500 MG capsule Take 1 capsule (500 mg total) by mouth 2 (two) times daily. Patient not taking: Reported on 04/29/2018 12/18/17   [name], DPM  collagenase (SANTYL)  ointment Apply 1 application topically daily. Patient not taking: Reported on 04/29/2018 10/17/17   [name], DPM  HYDROmorphone (DILAUDID) 2 MG tablet Take 0.5 tablets (1 mg total) by mouth every 6 (six) hours as needed for severe pain. Patient not taking: Reported on 04/29/2018 12/18/17   [name], DPM  predniSONE (DELTASONE) 20 MG tablet Take 1 tablet (20 mg total) by mouth 2 (two) times daily. 04/29/18   Daleen Bo, MD  traMADol (ULTRAM) 50 MG tablet Take 1 tablet (50 mg total) by mouth every 6 (six) hours as needed for moderate pain. 04/29/18   Daleen Bo, MD  Wound Dressings ([place] WOUND/BURN DRESSING)  GEL Apply to affected area as directed. Patient taking differently: Apply 1 application topically daily.  11/07/17   [name], DPM    Family History Family History  Problem Relation Age of Onset  . Heart attack Father 38  . Stroke Father   . Kidney failure Sister        Bright's disease   . Cancer Paternal Aunt        d. 55s; NOS cancer of leg  . Kidney disease Sister        d. 4; "Bright's disease" - possible kidney cancer?  . Cancer Sister        NOS cancer; d. 20s; "sick in the hospital"  . Heart attack Son 40  . Leukemia Son 29  . COPD Son   . Breast cancer Daughter 4    Social History Social History   Tobacco Use  . Smoking status: Former Smoker    Packs/day: 1.00    Years: 29.00    Pack years: 29.00    Types: Cigarettes    Last attempt to quit: 10/08/1978    Years since quitting: 39.5  . Smokeless tobacco: Never Used  . Tobacco comment: tried smokeless tobacco once but made him sick  Substance Use Topics  . Alcohol use: No    Comment: drank very little in his life - only socially   . Drug use: No     Allergies   Ace inhibitors   Review of Systems Review of Systems  All other systems reviewed and are negative.    Physical Exam Updated Vital Signs BP (!) 159/87 (BP Location: Left Arm)   Pulse 76   Temp 98.5 F (36.9  C) (Oral)   Resp 16   Ht 6' (1.829 m)   Wt 81.6 kg (180 lb)   SpO2 98%   BMI 24.41 kg/m   Physical Exam  Constitutional: He is oriented to person, place, and time. He appears well-developed. He appears distressed (He is uncomfortable).  Elderly, frail  HENT:  Head: Normocephalic and atraumatic.  Right Ear: External ear normal.  Left Ear: External ear normal.  Eyes: Pupils are equal, round, and reactive to light. Conjunctivae and EOM are normal.  Neck: Normal range of motion and phonation normal. Neck supple.  Cardiovascular: Normal rate and regular rhythm.  Pulmonary/Chest: Effort normal and breath sounds normal. He exhibits no bony tenderness.  Musculoskeletal: Normal range of motion. He exhibits no deformity.  Neurological: He is alert and oriented to person, place, and time. No cranial nerve deficit or sensory deficit. He exhibits normal muscle tone. Coordination normal.  Skin: Skin is warm, dry and intact.  Psychiatric: He has a normal mood and affect. His behavior is normal. Judgment and thought content normal.  Nursing note and vitals reviewed.    ED Treatments / Results  Labs (all labs ordered are listed, but only abnormal results are displayed) Labs Reviewed - No data to display  EKG None  Radiology Ct Cervical Spine Wo Contrast  Result Date: 04/29/2018 CLINICAL DATA:  84 year old male awoke with neck pain. Chronic neck pain but worse today especially right side. Denies injury. Initial encounter. EXAM: CT CERVICAL SPINE WITHOUT CONTRAST TECHNIQUE: Multidetector CT imaging of the cervical spine was performed without intravenous contrast. Multiplanar CT image reconstructions were also generated. COMPARISON:  11/30/2015 plain film exam. FINDINGS: Alignment: Mild curvature convex right. Reversal normal cervical lordosis. Minimal anterior slip C3 and C4. Findings similar to prior plain film exam. Skull base and vertebrae:  No cervical spine fracture. Soft tissues and spinal  canal: Although there is radiopaque material inferior to the anterior C1 ring, no abnormal prevertebral soft tissue swelling as can be seen with calcific tendinitis. Disc levels:  Cervicomedullary junction unremarkable. C1-2: Prominent degenerative changes anterior C1 ring-dens articulation. Mild transverse ligament hypertrophy with associated calcifications. Mild narrowing ventral thecal sac. C2-3: Facet degenerative changes greater on the right. Bulge. Mild narrowing ventral thecal sac. C3-4: Prominent left-sided and mild right-sided facet degenerative changes. Minimal anterior slip C3. Bulge and osteophyte greater to left. Spinal stenosis and slight cord flattening on the left. Mild to slightly moderate left-sided foraminal narrowing. C4-5: Moderate left facet degenerative changes. Bulge slightly greater to right. Spinal stenosis and slight cord flattening greater on the right. Moderate bilateral foraminal narrowing. C5-6: Minimal widening left facet joint. Prominent disc degeneration with broad-based disc osteophyte complex greater to right. Spinal stenosis and cord flattening greater on the right. Moderate foraminal narrowing greater on the right. C6-7: Disc osteophyte complex greatest right posterolateral position with spinal stenosis and cord flattening greater on the right. Mild to moderate foraminal narrowing greater on left. C7-T1: Bulge and osteophyte. Mild spinal stenosis. Mild right-sided and moderate left-sided foraminal narrowing. T1-2 through T3-4 limited evaluation secondary to shoulder artifact. Upper chest: No worrisome lung apical lesion. Other: Atherosclerotic changes aortic arch, great vessels and carotid bifurcation. IMPRESSION: 1. Cervical alignment similar to prior plain film examination. No cervical spine fracture noted. 2. Multilevel degenerative changes contribute to various degrees of spinal stenosis, foraminal narrowing and cord flattening as detailed above. If further delineation is  clinically desired MR may be considered. 3. Calcification inferior to the anterior C1 ring without prevertebral soft tissue swelling as can be seen with calcific tendinitis. 4.  Aortic Atherosclerosis (ICD10-I70.0). Electronically Signed   By: Genia Del M.D.   On: 04/29/2018 15:56    Procedures Procedures (including critical care time)  Medications Ordered in ED Medications  ketorolac (TORADOL) 30 MG/ML injection 30 mg (30 mg Intramuscular Given 04/29/18 1501)  predniSONE (DELTASONE) tablet 60 mg (60 mg Oral Given 04/29/18 1501)  diazepam (VALIUM) tablet 2 mg (2 mg Oral Given 04/29/18 1501)     Initial Impression / Assessment and Plan / ED Course  I have reviewed the triage vital signs and the nursing notes.  Pertinent labs & imaging results that were available during my care of the patient were reviewed by me and considered in my medical decision making (see chart for details).  Clinical Course as of Apr 29 1629  Tue Apr 29, 2018  1621 Moderate diffuse degenerative changes, including facets and spondylosis.  Varying degrees of spinal stenosis are present.  No evident fracture.   [EW]    Clinical Course User Index [EW] Daleen Bo, MD     Patient Vitals for the past 24 hrs:  BP Temp Temp src Pulse Resp SpO2 Height Weight  04/29/18 1249 (!) 159/87 98.5 F (36.9 C) Oral 76 16 98 % 6' (1.829 m) 81.6 kg (180 lb)    4:26 PM Reevaluation with update and discussion. After initial assessment and treatment, an updated evaluation reveals he states his pain is better.  Findings discussed with patient, and wife, all questions answered. Daleen Bo   Medical Decision Making: Neck pain secondary to degenerative changes of the cervical spine.  Doubt cervical myelopathy.  Doubt cervical radiculopathy.  Doubt fracture.  CRITICAL CARE-no Performed by: Daleen Bo   Nursing Notes Reviewed/ Care Coordinated Applicable Imaging Reviewed Interpretation of Laboratory Data  incorporated  into ED treatment  The patient appears reasonably screened and/or stabilized for discharge and I doubt any other medical condition or other [facility] requiring further screening, evaluation, or treatment in the ED at this time prior to discharge.  Plan: Home Medications-continue current medications; Home Treatments-rest, cryotherapy and heat therapy; return here if the recommended treatment, does not improve the symptoms; Recommended follow up-PCP follow-up 1 week and as needed.     Final Clinical Impressions(s) / ED Diagnoses   Final diagnoses:  Neck pain  Spondylosis of cervical region without myelopathy or radiculopathy    ED Discharge Orders        Ordered    predniSONE (DELTASONE) 20 MG tablet  2 times daily     04/29/18 1629    traMADol (ULTRAM) 50 MG tablet  Every 6 hours PRN     04/29/18 1629       Daleen Bo, MD 04/29/18 1630

## 2018-05-08 DIAGNOSIS — M4802 Spinal stenosis, cervical region: Secondary | ICD-10-CM | POA: Diagnosis not present

## 2018-05-25 NOTE — Progress Notes (Signed)
  Subjective:  Patient ID: Derrick [name], male    DOB: 1931-10-20,  MRN: [id number]  No chief complaint on file.  DOS: 09/13/2017 Procedure: Revision TMA right foot  84 y.o. male returns for post-op check. Denies N/V/F/Ch. Pain is controlled,  Objective:   General AA&O x3. Normal mood and affect.  Vascular Foot warm and well perfused.  Neurologic Gross sensation intact.  Dermatologic  incision well-healed medially.  Small area of incomplete healing at the distal left of the incision however skin edges are rather coapted with intact suture material.  Lateral aspect of the wound with fibrotic base without evidence of probe to bone.  Orthopedic: Tenderness to palpation noted about the surgical site.    Assessment & Plan:  Patient was evaluated and treated and all questions answered.  S/p right TMA revision -Progressing as expected post-operatively. -Sutures: Left intact. -Medications refilled: Dilaudid -Foot redressed. -Continue antibiotics to completion  Return in about 1 week (around 10/17/2017) for Post-op.

## 2018-05-25 NOTE — Progress Notes (Signed)
  Subjective:  Patient ID: Derrick [name], male    DOB: 10-03-1932,  MRN: [id number]  Chief Complaint  Patient presents with  . Routine Post [facility] 12.07.2018 revision transmetatarsal amputation rt    DOS: 09/13/2017 Procedure: Revision TMA right foot  84 y.o. male returns for post-op check. Denies N/V/F/Ch. Pain is controlled,  Objective:   General AA&O x3. Normal mood and affect.  Vascular Foot warm and well perfused.  Neurologic Gross sensation intact.  Dermatologic  incision well-healed medially.  Small area of incomplete healing at the distal left of the incision however skin edges are rather coapted with intact suture material.  Lateral aspect of the wound with fibrotic base without evidence of probe to bone.  Orthopedic: Tenderness to palpation noted about the surgical site.    Assessment & Plan:  Patient was evaluated and treated and all questions answered.  S/p right TMA revision -Progressing as expected post-operatively. -Sutures: Left intact. -Medications refilled: Dilaudid -Foot redressed. -Continue antibiotics to completion  Return in about 6 days (around 10/23/2017) for Post-op.

## 2018-06-11 ENCOUNTER — Emergency Department (HOSPITAL_COMMUNITY)
Admission: EM | Admit: 2018-06-11 | Discharge: 2018-06-11 | Disposition: A | Discharge status: Home / Self Care | Payer: Medicare Other | Attending: Emergency Medicine | Admitting: Emergency Medicine

## 2018-06-11 ENCOUNTER — Other Ambulatory Visit: Payer: Self-pay

## 2018-06-11 ENCOUNTER — Emergency Department (HOSPITAL_COMMUNITY): Payer: Medicare Other

## 2018-06-11 ENCOUNTER — Encounter (HOSPITAL_COMMUNITY): Payer: Self-pay

## 2018-06-11 DIAGNOSIS — N200 Calculus of kidney: Secondary | ICD-10-CM | POA: Diagnosis not present

## 2018-06-11 DIAGNOSIS — I129 Hypertensive chronic kidney disease with stage 1 through stage 4 chronic kidney disease, or unspecified chronic kidney disease: Secondary | ICD-10-CM | POA: Diagnosis not present

## 2018-06-11 DIAGNOSIS — Z79899 Other long term (current) drug therapy: Secondary | ICD-10-CM | POA: Diagnosis not present

## 2018-06-11 DIAGNOSIS — R9431 Abnormal electrocardiogram [ECG] [EKG]: Secondary | ICD-10-CM | POA: Diagnosis not present

## 2018-06-11 DIAGNOSIS — E7800 Pure hypercholesterolemia, unspecified: Secondary | ICD-10-CM | POA: Diagnosis not present

## 2018-06-11 DIAGNOSIS — M48061 Spinal stenosis, lumbar region without neurogenic claudication: Secondary | ICD-10-CM | POA: Diagnosis not present

## 2018-06-11 DIAGNOSIS — R109 Unspecified abdominal pain: Secondary | ICD-10-CM

## 2018-06-11 DIAGNOSIS — R531 Weakness: Secondary | ICD-10-CM | POA: Diagnosis not present

## 2018-06-11 DIAGNOSIS — N183 Chronic kidney disease, stage 3 (moderate): Secondary | ICD-10-CM | POA: Insufficient documentation

## 2018-06-11 DIAGNOSIS — J449 Chronic obstructive pulmonary disease, unspecified: Secondary | ICD-10-CM | POA: Insufficient documentation

## 2018-06-11 DIAGNOSIS — R1084 Generalized abdominal pain: Secondary | ICD-10-CM | POA: Diagnosis not present

## 2018-06-11 DIAGNOSIS — Z87891 Personal history of nicotine dependence: Secondary | ICD-10-CM | POA: Insufficient documentation

## 2018-06-11 DIAGNOSIS — Z7982 Long term (current) use of aspirin: Secondary | ICD-10-CM | POA: Diagnosis not present

## 2018-06-11 DIAGNOSIS — Z8546 Personal history of malignant neoplasm of prostate: Secondary | ICD-10-CM | POA: Diagnosis not present

## 2018-06-11 DIAGNOSIS — I2510 Atherosclerotic heart disease of native coronary artery without angina pectoris: Secondary | ICD-10-CM | POA: Insufficient documentation

## 2018-06-11 DIAGNOSIS — M5442 Lumbago with sciatica, left side: Secondary | ICD-10-CM | POA: Diagnosis not present

## 2018-06-11 DIAGNOSIS — R000 Tachycardia, unspecified: Secondary | ICD-10-CM | POA: Diagnosis not present

## 2018-06-11 DIAGNOSIS — M5432 Sciatica, left side: Secondary | ICD-10-CM | POA: Diagnosis not present

## 2018-06-11 LAB — CBC WITH DIFFERENTIAL/PLATELET
BASOS ABS: 0 10*3/uL (ref 0.0–0.1)
Basophils Relative: 0 %
EOS ABS: 0.4 10*3/uL (ref 0.0–0.7)
EOS PCT: 4 %
HCT: 36.4 % — ABNORMAL LOW (ref 39.0–52.0)
Hemoglobin: 11.5 g/dL — ABNORMAL LOW (ref 13.0–17.0)
Lymphocytes Relative: 14 %
Lymphs Abs: 1.2 10*3/uL (ref 0.7–4.0)
MCH: 25.4 pg — AB (ref 26.0–34.0)
MCHC: 31.6 g/dL (ref 30.0–36.0)
MCV: 80.4 fL (ref 78.0–100.0)
Monocytes Absolute: 0.9 10*3/uL (ref 0.1–1.0)
Monocytes Relative: 10 %
Neutro Abs: 6.5 10*3/uL (ref 1.7–7.7)
Neutrophils Relative %: 72 %
PLATELETS: 172 10*3/uL (ref 150–400)
RBC: 4.53 MIL/uL (ref 4.22–5.81)
RDW: 16.9 % — ABNORMAL HIGH (ref 11.5–15.5)
WBC: 9 10*3/uL (ref 4.0–10.5)

## 2018-06-11 LAB — COMPREHENSIVE METABOLIC PANEL
ALT: 21 U/L (ref 0–44)
AST: 20 U/L (ref 15–41)
Albumin: 3.6 g/dL (ref 3.5–5.0)
Alkaline Phosphatase: 54 U/L (ref 38–126)
Anion gap: 10 (ref 5–15)
BUN: 14 mg/dL (ref 8–23)
CHLORIDE: 107 mmol/L (ref 98–111)
CO2: 25 mmol/L (ref 22–32)
CREATININE: 1.32 mg/dL — AB (ref 0.61–1.24)
Calcium: 9.4 mg/dL (ref 8.9–10.3)
GFR calc non Af Amer: 48 mL/min — ABNORMAL LOW (ref >60)
GFR, EST AFRICAN AMERICAN: 56 mL/min — AB (ref >60)
Glucose, Bld: 107 mg/dL — ABNORMAL HIGH (ref 70–99)
Potassium: 3.8 mmol/L (ref 3.5–5.1)
SODIUM: 142 mmol/L (ref 135–145)
Total Bilirubin: 0.7 mg/dL (ref 0.3–1.2)
Total Protein: 6.7 g/dL (ref 6.5–8.1)

## 2018-06-11 MED ORDER — GABAPENTIN 100 MG PO CAPS
100.0000 mg | ORAL_CAPSULE | Freq: Once | ORAL | Status: AC
  Administered 2018-06-11: 100 mg via ORAL
  Filled 2018-06-11: qty 1

## 2018-06-11 MED ORDER — DOCUSATE SODIUM 100 MG PO CAPS: 100.0000 mg | ORAL_CAPSULE | Freq: Two times a day (BID) | ORAL | 0 refills | Status: DC

## 2018-06-11 MED ORDER — PREDNISONE 20 MG PO TABS: ORAL_TABLET | ORAL | 0 refills | Status: DC

## 2018-06-11 MED ORDER — FENTANYL CITRATE (PF) 100 MCG/2ML IJ SOLN
50.0000 ug | Freq: Once | INTRAMUSCULAR | Status: AC
  Administered 2018-06-11: 50 ug via INTRAVENOUS
  Filled 2018-06-11: qty 2

## 2018-06-11 MED ORDER — METHYLPREDNISOLONE SODIUM SUCC 125 MG IJ SOLR
125.0000 mg | Freq: Once | INTRAMUSCULAR | Status: AC
  Administered 2018-06-11: 125 mg via INTRAVENOUS
  Filled 2018-06-11: qty 2

## 2018-06-11 MED ORDER — OXYCODONE-ACETAMINOPHEN 5-325 MG PO TABS: 1.0000 | ORAL_TABLET | Freq: Three times a day (TID) | ORAL | 0 refills | Status: DC | PRN

## 2018-06-11 MED ORDER — GABAPENTIN 100 MG PO CAPS: 100.0000 mg | ORAL_CAPSULE | Freq: Three times a day (TID) | ORAL | 0 refills | Status: DC

## 2018-06-11 MED ORDER — LACTATED RINGERS IV BOLUS
1000.0000 mL | Freq: Once | INTRAVENOUS | Status: AC
  Administered 2018-06-11: 1000 mL via INTRAVENOUS

## 2018-06-11 NOTE — ED Triage Notes (Addendum)
Pt states that he had his right kidney removed years ago. Pt states that now he is having left sided "kidney" pain. Pt states that he has been lightheaded and dizzy. Pt A&Ox4. Pt states he has been urinating without difficulty.

## 2018-06-11 NOTE — ED Provider Notes (Signed)
Emergency Department Provider Note   I have reviewed the triage vital signs and the nursing notes.   HISTORY  Chief Complaint Flank Pain   HPI Derrick [name] is a 84 y.o. male with multiple medical problems as documented below the presents to the emergency department today secondary to left-sided pain.  Patient states her last 3 to 4 days he has had progressively worsening pain that seems to radiate from his left lower back down to his left lateral knee.  States he has symptoms similar to this in the right side when he had kidney cancer and thus want to come get checked out.  He does not endorse any numbness or weakness there describes the pain is sharp mitten.  His urine has been darker than normal recently but no.  No trauma.  No known history of sciatica however his wife states he had some kind of back surgery in the past. No other associated or modifying symptoms.    Past Medical History:  Diagnosis Date  . Anemia   . Anxiety   . Arthritis, rheumatoid ([place])   . BPH (benign prostatic hyperplasia)   . CAD (coronary artery disease)   . CKD (chronic kidney disease)   . COPD (chronic obstructive pulmonary disease) ([place])   . Depression   . Diverticulosis   . Elevated PSA   . Gangrene ([place])    of toe right foot  . GERD (gastroesophageal reflux disease)   . Herpes simplex labialis   . Hiatal hernia   . High cholesterol   . History of colonic polyps   . Hypertension   . Insomnia   . Lacunar infarction ([place])   . Memory loss   . Obesity   . Panic attacks   . Prostate cancer ([place])   . Renal cell cancer ([place])    s/p nephrectomy in 2000  . Seropositive rheumatoid arthritis ([place])   . Spermatocele   . Tinnitus   . Tremor of both hands     Patient Active Problem List   Diagnosis Date Noted  . Ulcer of right foot limited to breakdown of skin ([place])   . PAD (peripheral artery disease) ([place]) 09/23/2017  . Syncope and collapse 09/19/2017  . Osteomyelitis ([place])   . Malaise  09/09/2017  . Anorexia 09/09/2017  . Hypotension 09/09/2017  . Volume depletion 09/09/2017  . AKI (acute kidney injury) ([place]) 09/09/2017  . Syncope 09/09/2017  . Pain of right great toe 06/12/2017  . Hyponatremia 06/12/2017  . COPD (chronic obstructive pulmonary disease) ([place])   . Lacunar infarction ([place])   . Gangrene of toe of right foot ([place])   . Pre-syncope 11/21/2016  . Dyspnea on exertion 03/03/2016  . H/O unilateral Rt nephrectomy 03/03/2016  . Asymmetrical right sensorineural hearing loss 02/24/2016  . Bilateral hearing loss 02/24/2016  . Bilateral impacted cerumen 02/24/2016  . Arrhythmia 11/09/2015  . CAD (coronary artery disease) 11/09/2015  . Abnormal ECG 10/22/2013  . Dyspnea 10/22/2013  . Chest pain at rest 11/18/2011  . Unstable angina ([place]) 11/18/2011  . Thrombocytopenia ([place]) 11/18/2011  . Anemia 11/18/2011  . CKD (chronic kidney disease), stage III ([place]) 11/18/2011  . Hypertension   . High cholesterol   . GERD (gastroesophageal reflux disease)   . BPH (benign prostatic hyperplasia)     Past Surgical History:  Procedure Laterality Date  . AMPUTATION TOE Right 06/13/2017   Procedure: AMPUTATION TOE 1st and 2nd;  Surgeon: [name], DPM;  Location: [facility];  Service: Podiatry;  Laterality: Right;  . AMPUTATION TOE    . BACK SURGERY    . COLONOSCOPY    . GRAFT APPLICATION Right 05/25/5630   Procedure: EPIFIX GRAFT APPLICATION OF RIGHT FOOT;  Surgeon: [name], DPM;  Location: [facility];  Service: Podiatry;  Laterality: Right;  . I&D EXTREMITY Right 09/13/2017   Procedure: IRRIGATION AND DEBRIDEMENT AND BONE BIOPSY;  Surgeon: [name], DPM;  Location: [facility];  Service: Podiatry;  Laterality: Right;  . INCISION AND DRAINAGE OF WOUND Right 12/18/2017   Procedure: IRRIGATION AND DEBRIDEMENT WOUND ON RIGHT FOOT;  Surgeon: [name], DPM;  Location: [facility];  Service: Podiatry;  Laterality: Right;  . IR ANGIOGRAM EXTREMITY RIGHT  05/30/2017  . IR  ANGIOGRAM EXTREMITY RIGHT  08/08/2017  . IR ANGIOGRAM SELECTIVE EACH ADDITIONAL VESSEL  08/08/2017  . IR ANGIOGRAM SELECTIVE EACH ADDITIONAL VESSEL  08/08/2017  . IR RADIOLOGIST EVAL & MGMT  05/16/2017  . IR TIB-PERO ART PTA MOD SED  05/30/2017  . IR TIB-PERO ART PTA MOD SED  08/08/2017  . IR TIB-PERO ART UNI PTA EA ADD VESSEL MOD SED  05/30/2017  . IR TIB-PERO ART UNI PTA EA ADD VESSEL MOD SED  08/08/2017  . IR US GUIDE VASC ACCESS RIGHT  05/30/2017  . IR US GUIDE VASC ACCESS RIGHT  08/08/2017  . LEFT HEART CATHETERIZATION WITH CORONARY ANGIOGRAM N/A 11/19/2011   Procedure: LEFT HEART CATHETERIZATION WITH CORONARY ANGIOGRAM;  Surgeon: [name], MD;  Location[facility] C[facility] LAB;  Service: Cardiovascular;  Laterality: N/A;  . NEPHRECTOMY  10/1998  . TRANSMETATARSAL AMPUTATION Right 08/14/2017   Procedure: TRANSMETATARSAL AMPUTATION;  Surgeon: [name], DPM;  Location: [facility];  Service: Podiatry;  Laterality: Right;    Current Outpatient Rx  . Order #: [id number] Class: Normal  . Order #: [id number] Class: Historical Med  . Order #: [id number] Class: Historical Med  . Order #: [id number] Class: Historical Med  . Order #: [id number] Class: Historical Med  . Order #: [id number] Class: Historical Med  . Order #: [id number] Class: Normal  . Order #: [id number] Class: Historical Med  . Order #: [id number] Class: Historical Med  . Order #: [id number] Class: Print  . Order #: [id number] Class: Print  . Order #: [id number] Class: Print  . Order #: [id number] Class: Print    Allergies Ace inhibitors  Family History  Problem Relation Age of Onset  . Heart attack Father 76  . Stroke Father   . Kidney failure Sister        Bright's disease   . Cancer Paternal Aunt        d. 49s; NOS cancer of leg  . Kidney disease Sister        d. 30; "Bright's disease" - possible kidney cancer?  . Cancer Sister        NOS cancer; d. 40s; "sick in the hospital"  . Heart attack Son 81  . Leukemia Son 48  . COPD Son   . Breast  cancer Daughter 39    Social History Social History   Tobacco Use  . Smoking status: Former Smoker    Packs/day: 1.00    Years: 29.00    Pack years: 29.00    Types: Cigarettes    Last attempt to quit: 10/08/1978    Years since quitting: 39.7  . Smokeless tobacco: Never Used  . Tobacco comment: tried smokeless tobacco once but made him sick  Substance Use Topics  . Alcohol use: No    Comment: drank very little  in his life - only socially   . Drug use: No    Review of Systems  All other systems negative except as documented in the HPI. All pertinent positives and negatives as reviewed in the HPI. ____________________________________________   PHYSICAL EXAM:  VITAL SIGNS: ED Triage Vitals  Enc Vitals Group     BP 06/11/18 0814 132/67     Pulse Rate 06/11/18 0814 (!) 112     Resp 06/11/18 0814 15     Temp 06/11/18 0814 98.2 F (36.8 C)     Temp Source 06/11/18 0814 Oral     SpO2 06/11/18 0814 100 %     Weight 06/11/18 0819 180 lb (81.6 kg)     Height 06/11/18 0819 6' (1.829 m)    Constitutional: Alert and oriented. Well appearing and in no acute distress. Eyes: Conjunctivae are normal. PERRL. EOMI. Head: Atraumatic. Nose: No congestion/rhinnorhea. Mouth/Throat: Mucous membranes are moist.  Oropharynx non-erythematous. Neck: No stridor.  No meningeal signs.   Cardiovascular: tachycardic rate, regular rhythm. Good peripheral circulation. Grossly normal heart sounds.   Respiratory: Normal respiratory effort.  No retractions. Lungs CTAB. Gastrointestinal: Soft and nontender. No distention.  Musculoskeletal: No lower extremity tenderness nor edema. No gross deformities of extremities. Neurologic:  Normal speech and language. No gross focal neurologic deficits are appreciated. Symmetric weakness in both LE's. No sensory deficit. Skin:  Skin is warm, dry and intact. No rash noted.  ____________________________________________   LABS (all labs ordered are listed, but  only abnormal results are displayed)  Labs Reviewed  CBC WITH DIFFERENTIAL/PLATELET - Abnormal; Notable for the following components:      Result Value   Hemoglobin 11.5 (*)    HCT 36.4 (*)    MCH 25.4 (*)    RDW 16.9 (*)    All other components within normal limits  COMPREHENSIVE METABOLIC PANEL - Abnormal; Notable for the following components:   Glucose, Bld 107 (*)    Creatinine, Ser 1.32 (*)    GFR calc non Af Amer 48 (*)    GFR calc Af Amer 56 (*)    All other components within normal limits  URINALYSIS, ROUTINE W REFLEX MICROSCOPIC   ____________________________________________  EKG   EKG Interpretation  Date/Time:  Wednesday June 11 2018 08:19:17 EDT Ventricular Rate:  93 PR Interval:    QRS Duration: 100 QT Interval:  333 QTC Calculation: 415 R Axis:   33 Text Interpretation:  Atrial fibrillation Abnormal R-wave progression, early transition Nonspecific T abnormalities, lateral leads No significant change since last tracing Confirmed by Merrily Pew [phone]) on 06/11/2018 9:05:33 AM       ____________________________________________  RADIOLOGY  Ct Lumbar Spine Wo Contrast  Result Date: 06/11/2018 CLINICAL DATA:  Left flank and left-sided back pain for several days. EXAM: CT LUMBAR SPINE WITHOUT CONTRAST TECHNIQUE: Multidetector CT imaging of the lumbar spine was performed without intravenous contrast administration. Multiplanar CT image reconstructions were also generated. COMPARISON:  Radiographs dated 08/25/2015 and MRI dated 11/11/2014 FINDINGS: Segmentation: 5 lumbar type vertebrae.  Vestigial disc at S1-2. Alignment: Chronic grade 1 spondylolisthesis of L4 on L5 and of L5 on S1. Vertebrae: No acute fractures or bone destruction. Diffuse patchy osteopenia. Prior posterior decompression at L5-S1. Paraspinal and other soft tissues: Right nephrectomy. Aortic atherosclerosis. Disc levels: T11-12: Negative. T12-L1: Negative. L1-2: Severe chronic degenerative disc  disease with disc space narrowing multiple erosions of the vertebral endplates. Small broad-based disc bulge with accompanying osteophytes without focal neural impingement. L2-3: Chronic degenerative disc  disease with disc space narrowing. Broad-based disc protrusion with accompanying osteophytes slight combined with hypertrophy of the right ligamentum flavum creates moderately severe to severe spinal stenosis. L3-4: Slight disc degeneration. Broad-based disc bulge slightly compresses the thecal sac symmetrically. Slight bilateral facet arthritis. L4-5: Severe degenerative disc disease with a vacuum phenomenon. Grade 1 spondylolisthesis. Herniated gas from the disc space extends into the spinal canal to the left of midline superiorly behind the body of L4. Severe bilateral facet arthritis and ligamentum flavum hypertrophy combine to create severe impingement upon the lateral recesses, left greater than right with moderately severe spinal stenosis. L5-S1: Prior posterior decompression. Grade 1 spondylolisthesis. Hypertrophied facets impinge upon both lateral recesses and could affect either or both L5 nerves but more likely on the left as seen on image 92 of series 3. No significant foraminal stenosis. Deformity of the facet joints suggest chronic bilateral pars defects at L5 although the findings could be postsurgical in origin. IMPRESSION: 1. Moderately severe to severe spinal stenosis at L2-3 due to a combination of broad-based disc protrusion and hypertrophy of the ligamentum flavum, right greater than left. 2. Moderately severe spinal stenosis at L4-5 impingement upon both lateral recesses, left greater than right. 3. Severe bilateral facet arthritis at L4-5. 4. Progressive severe degenerative disc disease at L1-2 without focal neural impingement. 5. Diffuse patchy osteopenia without focal bone destruction. 6.  Aortic Atherosclerosis (ICD10-I70.0). Electronically Signed   By: Lorriane Shire M.D.   On: 06/11/2018  11:13   Ct Renal Stone Study  Result Date: 06/11/2018 CLINICAL DATA:  Left flank pain.  Right nephrectomy for cancer. EXAM: CT ABDOMEN AND PELVIS WITHOUT CONTRAST TECHNIQUE: Multidetector CT imaging of the abdomen and pelvis was performed following the standard protocol without IV contrast. COMPARISON:  CT scan dated 11/15/2000 FINDINGS: Lower chest: No acute abnormality. Aortic atherosclerosis. Coronary artery calcifications. Slight chronic interstitial disease at the lung bases. Hepatobiliary: No focal liver abnormality is seen. No gallstones, gallbladder wall thickening, or biliary dilatation. Pancreas: Unremarkable. No pancreatic ductal dilatation or surrounding inflammatory changes. Spleen: Normal in size without focal abnormality. Adrenals/Urinary Tract: Normal adrenal glands. Two tiny stones in the upper pole of the otherwise normal appearing left kidney. Normal left ureter and bladder. Right nephrectomy. Stomach/Bowel: There are a few diverticula in the distal colon. The bowel otherwise appears normal. Vascular/Lymphatic: Extensive aortic atherosclerosis. No adenopathy. Reproductive: Slight prominence of the prostate gland. Other: No abdominal wall hernia or abnormality. No abdominopelvic ascites. Musculoskeletal: No acute bone abnormality. Multilevel degenerative disc and joint disease in the lumbar spine. IMPRESSION: 1. No acute abnormality of the abdomen or pelvis. 2. Two tiny stones in the upper pole of the otherwise normal left kidney. 3. Status post right nephrectomy. 4.  Aortic Atherosclerosis (ICD10-I70.0). Electronically Signed   By: Lorriane Shire M.D.   On: 06/11/2018 11:20    ____________________________________________   INITIAL IMPRESSION / ASSESSMENT AND PLAN / ED COURSE  Kidney stone versus sciatica.  Doubt intrinsic renal disease.  Is a little bit tachycardic difficult to tell if this is from pain, dehydration or other cause we will give some fluids, pain medication CT scan  labs.  CT with evidence of L2-3/L4-5 abnormalities consistent with his sciatica.  Will treat with anti-inflammatories, steroids with neurosurgery follow-up.  Return here if he starts having weakness, urinary symptoms or worsening symptoms.   Pertinent labs & imaging results that were available during my care of the patient were reviewed by me and considered in my medical decision making (see  chart for details).  ____________________________________________  FINAL CLINICAL IMPRESSION(S) / ED DIAGNOSES  Final diagnoses:  Left flank pain  Sciatica of left side     MEDICATIONS GIVEN DURING THIS VISIT:  Medications  fentaNYL (SUBLIMAZE) injection 50 mcg (50 mcg Intravenous Given 06/11/18 0858)  lactated ringers bolus 1,000 mL (0 mLs Intravenous Stopped 06/11/18 1031)  methylPREDNISolone sodium succinate (SOLU-MEDROL) 125 mg/2 mL injection 125 mg (125 mg Intravenous Given 06/11/18 1156)  gabapentin (NEURONTIN) capsule 100 mg (100 mg Oral Given 06/11/18 1156)     NEW OUTPATIENT MEDICATIONS STARTED DURING THIS VISIT:  New Prescriptions   DOCUSATE SODIUM (COLACE) 100 MG CAPSULE    Take 1 capsule (100 mg total) by mouth every 12 (twelve) hours.   GABAPENTIN (NEURONTIN) 100 MG CAPSULE    Take 1 capsule (100 mg total) by mouth 3 (three) times daily.   OXYCODONE-ACETAMINOPHEN (PERCOCET) 5-325 MG TABLET    Take 1 tablet by mouth every 8 (eight) hours as needed.   PREDNISONE (DELTASONE) 20 MG TABLET    3 tabs po daily x 3 days, then 2 tabs x 3 days, then 1.5 tabs x 3 days, then 1 tab x 3 days, then 0.5 tabs x 3 days    Note:  This note was prepared with assistance of Dragon voice recognition software. Occasional wrong-word or sound-a-like substitutions may have occurred due to the inherent limitations of voice recognition software.   Merrily Pew, MD 06/11/18 [phone]

## 2018-06-18 DIAGNOSIS — J069 Acute upper respiratory infection, unspecified: Secondary | ICD-10-CM | POA: Diagnosis not present

## 2018-06-18 DIAGNOSIS — Z23 Encounter for immunization: Secondary | ICD-10-CM | POA: Diagnosis not present

## 2018-06-18 DIAGNOSIS — M25552 Pain in left hip: Secondary | ICD-10-CM | POA: Diagnosis not present

## 2018-06-18 NOTE — Progress Notes (Signed)
  Subjective:  Patient ID: Derrick [name], male    DOB: Mar 10, 1932,  MRN: [id number]  No chief complaint on file.  84 y.o. male returns for wound care.  Going to the wound care center states that is doing better.  Objective:   There were no vitals filed for this visit. General AA&O x3. Normal mood and affect.  Vascular Foot warm to touch.  Neurologic Sensation grossly diminished.  Dermatologic (Wound) Right foot closing well with ~1 x 1 cm open area  Orthopedic: No pain to palpation either foot.   Assessment & Plan:  Patient was evaluated and treated and all questions answered.  Ulcer right lateral foot secondary to dehiscence from transmetatarsal application -Dressed with medihoney and DSD. -Continue current wound care center. -Follow-up should wound progress

## 2018-06-18 NOTE — Progress Notes (Signed)
  Subjective:  Patient ID: Derrick [name], male    DOB: [birth year],  MRN: [id number]  84 y.o. male returns for wound care.  Status post TMA revision 09/13/2017.  No complaints today.  Denies nausea vomiting fever chills.  Objective:   Vitals:   11/14/17 1408  BP: 134/81  Pulse: 85  Temp: 98.6 F (37 C)   General AA&O x3. Normal mood and affect.  Vascular Foot warm to touch.  Neurologic Sensation grossly diminished.  Dermatologic (Wound) Wound Location: Right foot lateral Wound Measurement: 3 x 3 x 0.2 Wound Base: Mixed Granular/Fibrotic Peri-wound: Normal Exudate: None: wound tissue dry  Wound progress: Decreased since last check.  Orthopedic: No pain to palpation either foot.   Assessment & Plan:  Patient was evaluated and treated and all questions answered.  Ulcer right lateral foot -Debridement performed of the open area.  Under global. -Continue daily dressing with home health care  Return in about 1 week (around 11/21/2017) for Post-op, Wound Care.

## 2018-07-07 ENCOUNTER — Ambulatory Visit: Payer: Medicare Other | Admitting: Cardiovascular Disease

## 2018-08-18 ENCOUNTER — Encounter: Payer: Self-pay | Admitting: Cardiovascular Disease

## 2018-08-18 DIAGNOSIS — E7800 Pure hypercholesterolemia, unspecified: Secondary | ICD-10-CM | POA: Diagnosis not present

## 2018-08-21 DIAGNOSIS — Z86010 Personal history of colonic polyps: Secondary | ICD-10-CM | POA: Diagnosis not present

## 2018-08-21 DIAGNOSIS — I2510 Atherosclerotic heart disease of native coronary artery without angina pectoris: Secondary | ICD-10-CM | POA: Diagnosis not present

## 2018-08-21 DIAGNOSIS — I10 Essential (primary) hypertension: Secondary | ICD-10-CM | POA: Diagnosis not present

## 2018-08-21 DIAGNOSIS — M1712 Unilateral primary osteoarthritis, left knee: Secondary | ICD-10-CM | POA: Diagnosis not present

## 2018-08-21 DIAGNOSIS — M25562 Pain in left knee: Secondary | ICD-10-CM | POA: Diagnosis not present

## 2018-08-21 DIAGNOSIS — Z8679 Personal history of other diseases of the circulatory system: Secondary | ICD-10-CM | POA: Diagnosis not present

## 2018-08-21 DIAGNOSIS — K219 Gastro-esophageal reflux disease without esophagitis: Secondary | ICD-10-CM | POA: Diagnosis not present

## 2018-08-27 DIAGNOSIS — Z13820 Encounter for screening for osteoporosis: Secondary | ICD-10-CM | POA: Diagnosis not present

## 2018-08-27 DIAGNOSIS — M0579 Rheumatoid arthritis with rheumatoid factor of multiple sites without organ or systems involvement: Secondary | ICD-10-CM | POA: Diagnosis not present

## 2018-08-27 DIAGNOSIS — M1990 Unspecified osteoarthritis, unspecified site: Secondary | ICD-10-CM | POA: Diagnosis not present

## 2018-08-27 DIAGNOSIS — M25562 Pain in left knee: Secondary | ICD-10-CM | POA: Diagnosis not present

## 2018-08-27 DIAGNOSIS — M1120 Other chondrocalcinosis, unspecified site: Secondary | ICD-10-CM | POA: Diagnosis not present

## 2018-08-27 DIAGNOSIS — M8580 Other specified disorders of bone density and structure, unspecified site: Secondary | ICD-10-CM | POA: Diagnosis not present

## 2018-08-27 DIAGNOSIS — M8589 Other specified disorders of bone density and structure, multiple sites: Secondary | ICD-10-CM | POA: Diagnosis not present

## 2018-09-03 DIAGNOSIS — M25562 Pain in left knee: Secondary | ICD-10-CM | POA: Diagnosis not present

## 2018-09-03 DIAGNOSIS — M1712 Unilateral primary osteoarthritis, left knee: Secondary | ICD-10-CM | POA: Diagnosis not present

## 2018-09-12 IMAGING — DX DG FOOT 2V*R*
2 series · 2 of 2 positions shown · non-contrast
Comparison: 06/12/2017 foot radiographs

CLINICAL DATA: 84 y/o  M; first and second toe amputation.

EXAM:
RIGHT FOOT - 2 VIEW

[leg]
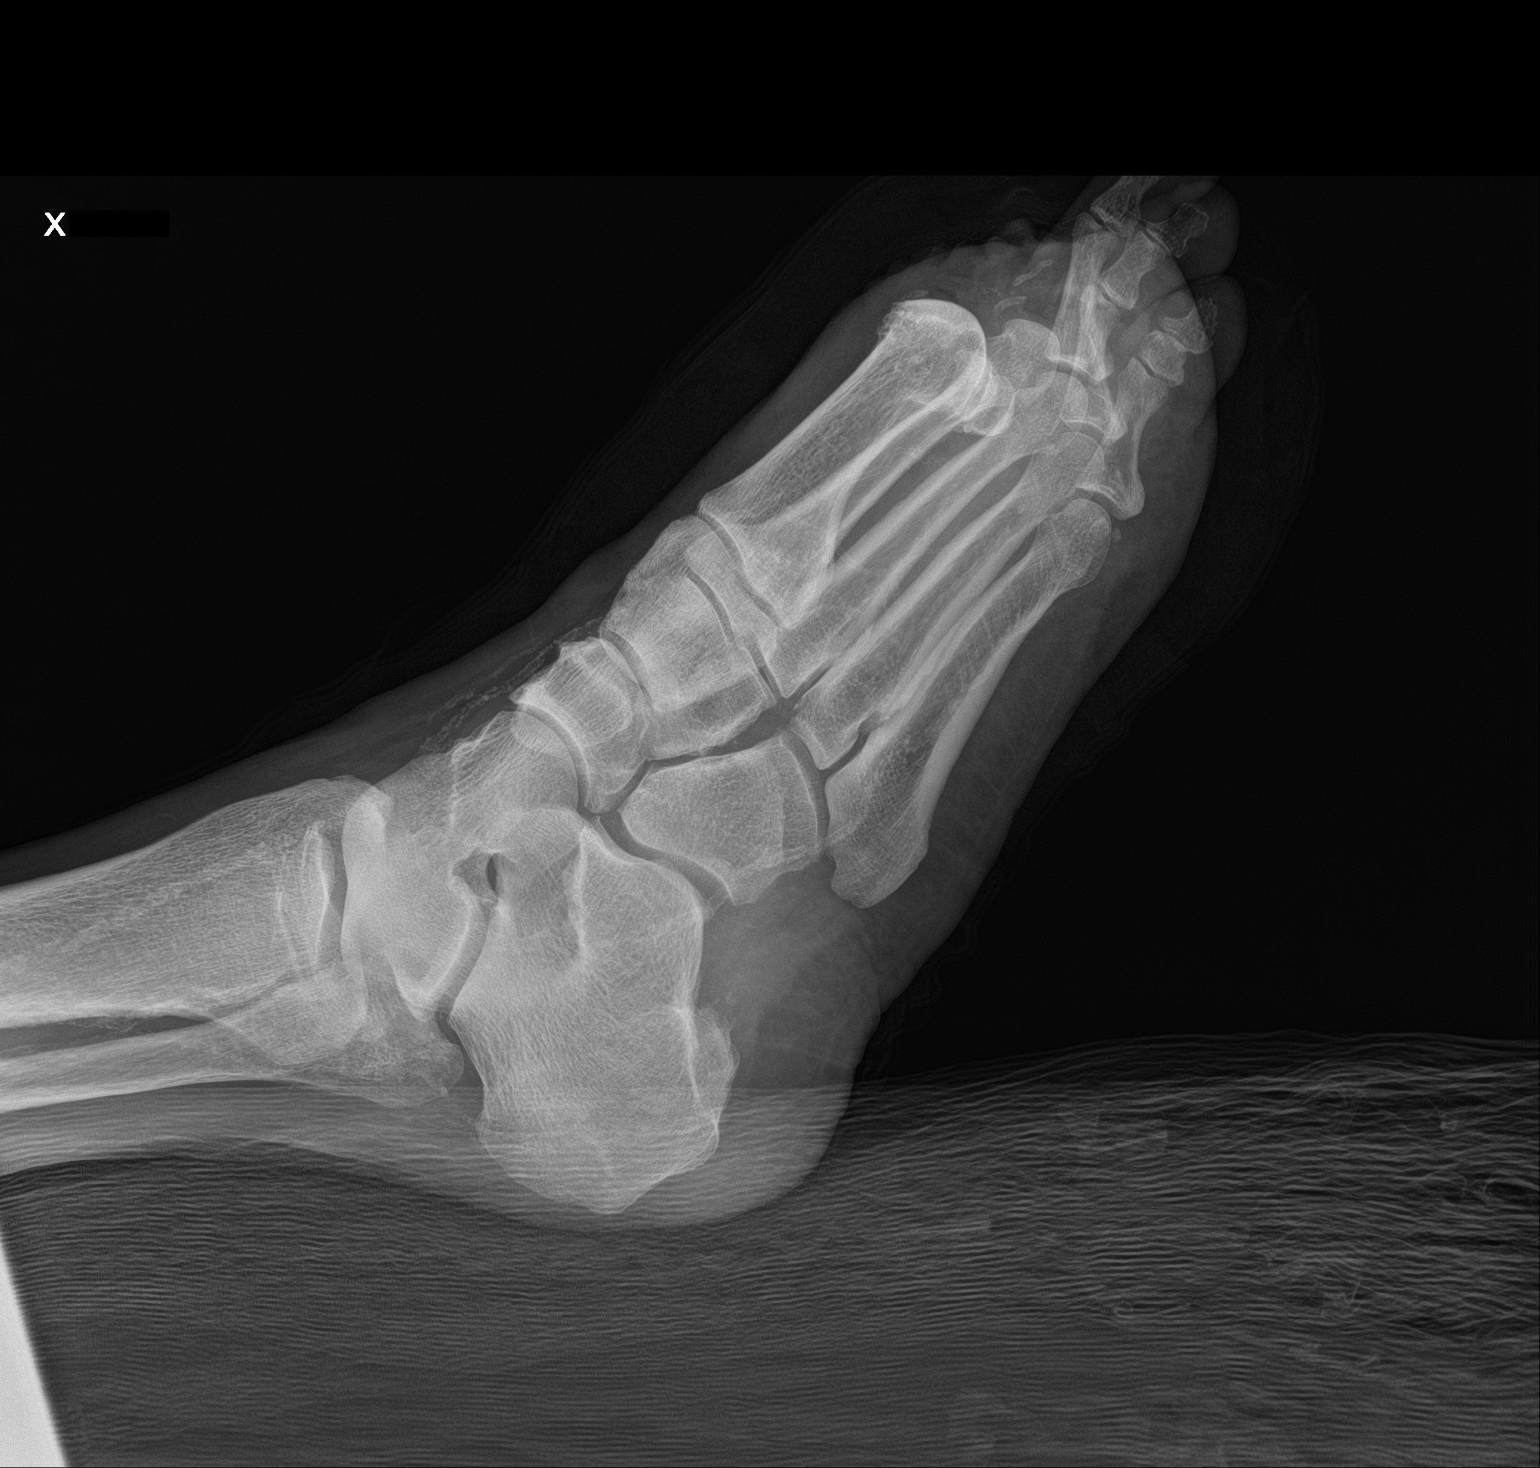

[foot]
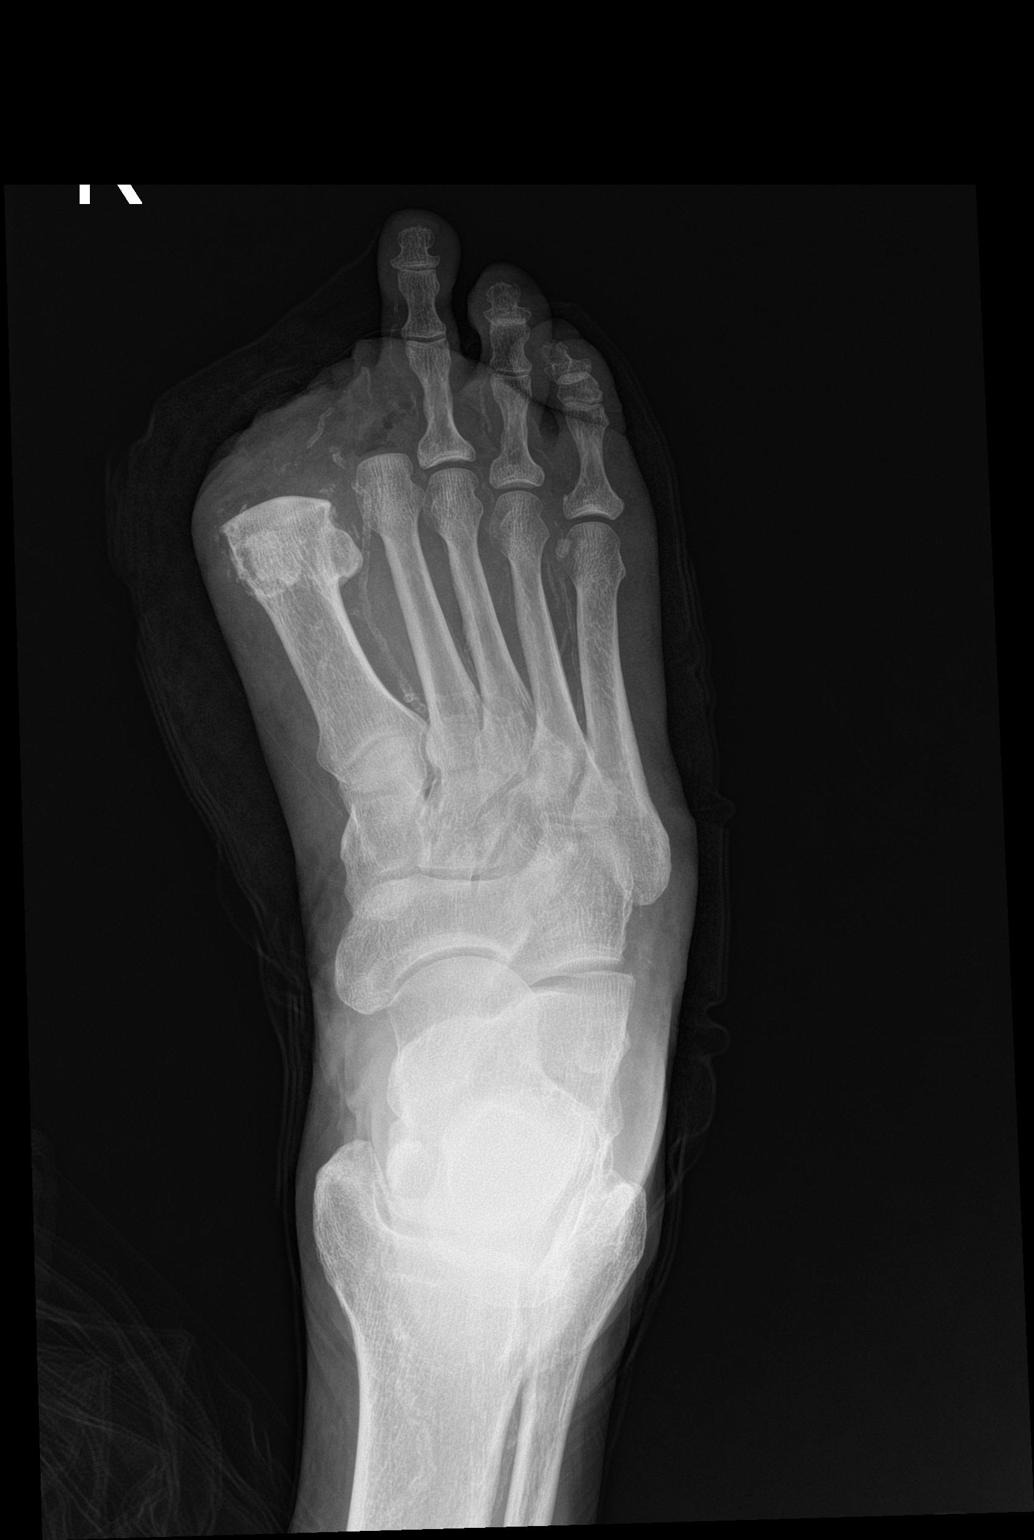

[2 of 2 positions shown; findings below may reference images not displayed]

FINDINGS: Postsurgical changes related to the first and second digit
amputation at the metatarsophalangeal joints. Extensive vascular
calcification. No acute fracture or dislocation identified. Lisfranc
alignment is maintained.
IMPRESSION: Postsurgical changes related of first and second digit amputation at
the metatarsophalangeal joints.

By: Paulus N Ceejay M.D.

## 2018-09-17 ENCOUNTER — Other Ambulatory Visit: Payer: Self-pay | Admitting: Cardiovascular Disease

## 2018-09-25 DIAGNOSIS — Z79899 Other long term (current) drug therapy: Secondary | ICD-10-CM | POA: Diagnosis not present

## 2018-09-25 DIAGNOSIS — I2510 Atherosclerotic heart disease of native coronary artery without angina pectoris: Secondary | ICD-10-CM | POA: Diagnosis not present

## 2018-09-25 DIAGNOSIS — Z85528 Personal history of other malignant neoplasm of kidney: Secondary | ICD-10-CM | POA: Diagnosis not present

## 2018-09-25 DIAGNOSIS — K5900 Constipation, unspecified: Secondary | ICD-10-CM | POA: Diagnosis not present

## 2018-09-25 DIAGNOSIS — E7800 Pure hypercholesterolemia, unspecified: Secondary | ICD-10-CM | POA: Diagnosis not present

## 2018-10-02 ENCOUNTER — Emergency Department (HOSPITAL_COMMUNITY): Payer: Medicare Other

## 2018-10-02 ENCOUNTER — Encounter (HOSPITAL_COMMUNITY): Payer: Self-pay

## 2018-10-02 ENCOUNTER — Other Ambulatory Visit: Payer: Self-pay

## 2018-10-02 ENCOUNTER — Emergency Department (HOSPITAL_COMMUNITY)
Admission: EM | Admit: 2018-10-02 | Discharge: 2018-10-03 | Disposition: A | Discharge status: Home / Self Care | Payer: Medicare Other | Attending: Emergency Medicine | Admitting: Emergency Medicine

## 2018-10-02 DIAGNOSIS — R200 Anesthesia of skin: Secondary | ICD-10-CM | POA: Diagnosis not present

## 2018-10-02 DIAGNOSIS — Z8546 Personal history of malignant neoplasm of prostate: Secondary | ICD-10-CM | POA: Diagnosis not present

## 2018-10-02 DIAGNOSIS — M25562 Pain in left knee: Secondary | ICD-10-CM | POA: Diagnosis not present

## 2018-10-02 DIAGNOSIS — S8992XA Unspecified injury of left lower leg, initial encounter: Secondary | ICD-10-CM | POA: Diagnosis not present

## 2018-10-02 DIAGNOSIS — M5136 Other intervertebral disc degeneration, lumbar region: Secondary | ICD-10-CM | POA: Diagnosis not present

## 2018-10-02 DIAGNOSIS — N189 Chronic kidney disease, unspecified: Secondary | ICD-10-CM | POA: Diagnosis not present

## 2018-10-02 DIAGNOSIS — I129 Hypertensive chronic kidney disease with stage 1 through stage 4 chronic kidney disease, or unspecified chronic kidney disease: Secondary | ICD-10-CM | POA: Diagnosis not present

## 2018-10-02 DIAGNOSIS — Z87891 Personal history of nicotine dependence: Secondary | ICD-10-CM | POA: Diagnosis not present

## 2018-10-02 DIAGNOSIS — M48061 Spinal stenosis, lumbar region without neurogenic claudication: Secondary | ICD-10-CM

## 2018-10-02 DIAGNOSIS — J449 Chronic obstructive pulmonary disease, unspecified: Secondary | ICD-10-CM | POA: Diagnosis not present

## 2018-10-02 DIAGNOSIS — Z79899 Other long term (current) drug therapy: Secondary | ICD-10-CM | POA: Insufficient documentation

## 2018-10-02 DIAGNOSIS — M545 Low back pain: Secondary | ICD-10-CM | POA: Diagnosis present

## 2018-10-02 DIAGNOSIS — R531 Weakness: Secondary | ICD-10-CM | POA: Diagnosis not present

## 2018-10-02 DIAGNOSIS — M9983 Other biomechanical lesions of lumbar region: Secondary | ICD-10-CM | POA: Diagnosis not present

## 2018-10-02 DIAGNOSIS — I2510 Atherosclerotic heart disease of native coronary artery without angina pectoris: Secondary | ICD-10-CM | POA: Diagnosis not present

## 2018-10-02 DIAGNOSIS — Z789 Other specified health status: Secondary | ICD-10-CM

## 2018-10-02 DIAGNOSIS — Z85528 Personal history of other malignant neoplasm of kidney: Secondary | ICD-10-CM | POA: Diagnosis not present

## 2018-10-02 NOTE — ED Triage Notes (Signed)
Pt here from home with lower back pain and bilateral leg pain.  Went to orthopedics for a shot of medicine and it worked well. Has not been back since.  Pain has been persist ant for 2 weeks.

## 2018-10-03 ENCOUNTER — Emergency Department (HOSPITAL_COMMUNITY): Payer: Medicare Other

## 2018-10-03 DIAGNOSIS — M545 Low back pain: Secondary | ICD-10-CM | POA: Diagnosis not present

## 2018-10-03 LAB — I-STAT CHEM 8, ED
BUN: 20 mg/dL (ref 8–23)
CHLORIDE: 101 mmol/L (ref 98–111)
CREATININE: 1.1 mg/dL (ref 0.61–1.24)
Calcium, Ion: 1.17 mmol/L (ref 1.15–1.40)
Glucose, Bld: 102 mg/dL — ABNORMAL HIGH (ref 70–99)
HCT: 37 % — ABNORMAL LOW (ref 39.0–52.0)
Hemoglobin: 12.6 g/dL — ABNORMAL LOW (ref 13.0–17.0)
POTASSIUM: 4.2 mmol/L (ref 3.5–5.1)
Sodium: 133 mmol/L — ABNORMAL LOW (ref 135–145)
TCO2: 26 mmol/L (ref 22–32)

## 2018-10-03 LAB — BASIC METABOLIC PANEL
Anion gap: 11 (ref 5–15)
BUN: 14 mg/dL (ref 8–23)
CHLORIDE: 101 mmol/L (ref 98–111)
CO2: 22 mmol/L (ref 22–32)
Calcium: 9.4 mg/dL (ref 8.9–10.3)
Creatinine, Ser: 1.31 mg/dL — ABNORMAL HIGH (ref 0.61–1.24)
GFR calc Af Amer: 58 mL/min — ABNORMAL LOW (ref >60)
GFR calc non Af Amer: 50 mL/min — ABNORMAL LOW (ref >60)
GLUCOSE: 107 mg/dL — AB (ref 70–99)
POTASSIUM: 3.7 mmol/L (ref 3.5–5.1)
Sodium: 134 mmol/L — ABNORMAL LOW (ref 135–145)

## 2018-10-03 LAB — CBC WITH DIFFERENTIAL/PLATELET
ABS IMMATURE GRANULOCYTES: 0.05 10*3/uL (ref 0.00–0.07)
BASOS PCT: 0 %
Basophils Absolute: 0 10*3/uL (ref 0.0–0.1)
Eosinophils Absolute: 0.2 10*3/uL (ref 0.0–0.5)
Eosinophils Relative: 2 %
HCT: 38.2 % — ABNORMAL LOW (ref 39.0–52.0)
HEMOGLOBIN: 12.3 g/dL — AB (ref 13.0–17.0)
Immature Granulocytes: 1 %
Lymphocytes Relative: 24 %
Lymphs Abs: 2.4 10*3/uL (ref 0.7–4.0)
MCH: 27.4 pg (ref 26.0–34.0)
MCHC: 32.2 g/dL (ref 30.0–36.0)
MCV: 85.1 fL (ref 80.0–100.0)
MONO ABS: 1 10*3/uL (ref 0.1–1.0)
MONOS PCT: 10 %
Neutro Abs: 6.2 10*3/uL (ref 1.7–7.7)
Neutrophils Relative %: 63 %
Platelets: 203 10*3/uL (ref 150–400)
RBC: 4.49 MIL/uL (ref 4.22–5.81)
RDW: 15.3 % (ref 11.5–15.5)
WBC: 9.9 10*3/uL (ref 4.0–10.5)
nRBC: 0 % (ref 0.0–0.2)

## 2018-10-03 LAB — SEDIMENTATION RATE: Sed Rate: 7 mm/hr (ref 0–16)

## 2018-10-03 LAB — C-REACTIVE PROTEIN: CRP: <0.8 mg/dL (ref <1.0)

## 2018-10-03 MED ORDER — IOPAMIDOL (ISOVUE-370) INJECTION 76%
100.0000 mL | Freq: Once | INTRAVENOUS | Status: AC | PRN
  Administered 2018-10-03: 100 mL via INTRAVENOUS

## 2018-10-03 MED ORDER — MORPHINE SULFATE (PF) 4 MG/ML IV SOLN
4.0000 mg | Freq: Once | INTRAVENOUS | Status: AC
  Administered 2018-10-03: 4 mg via INTRAVENOUS
  Filled 2018-10-03: qty 1

## 2018-10-03 MED ORDER — ONDANSETRON HCL 4 MG/2ML IJ SOLN
4.0000 mg | Freq: Once | INTRAMUSCULAR | Status: AC
  Administered 2018-10-03: 4 mg via INTRAVENOUS
  Filled 2018-10-03: qty 2

## 2018-10-03 MED ORDER — KETOROLAC TROMETHAMINE 15 MG/ML IJ SOLN
15.0000 mg | Freq: Once | INTRAMUSCULAR | Status: AC
  Administered 2018-10-03: 15 mg via INTRAVENOUS
  Filled 2018-10-03: qty 1

## 2018-10-03 MED ORDER — LABETALOL HCL 5 MG/ML IV SOLN: 10.0000 mg | Freq: Once | INTRAVENOUS | Status: DC

## 2018-10-03 MED ORDER — LIDOCAINE 5 % EX PTCH: 1.0000 | MEDICATED_PATCH | CUTANEOUS | 0 refills | Status: AC

## 2018-10-03 MED ORDER — DEXAMETHASONE SODIUM PHOSPHATE 10 MG/ML IJ SOLN
10.0000 mg | Freq: Once | INTRAMUSCULAR | Status: AC
  Administered 2018-10-03: 10 mg via INTRAVENOUS
  Filled 2018-10-03: qty 1

## 2018-10-03 MED ORDER — IOPAMIDOL (ISOVUE-370) INJECTION 76%
INTRAVENOUS | Status: AC
  Filled 2018-10-03: qty 100

## 2018-10-03 MED ORDER — DSS 250 MG PO CAPS: 250.0000 mg | ORAL_CAPSULE | Freq: Every day | ORAL | 0 refills | Status: AC

## 2018-10-03 MED ORDER — OXYCODONE HCL 5 MG PO TABS: 2.5000 mg | ORAL_TABLET | Freq: Four times a day (QID) | ORAL | 0 refills | Status: DC | PRN

## 2018-10-03 MED ORDER — METOPROLOL TARTRATE 5 MG/5ML IV SOLN
2.5000 mg | Freq: Once | INTRAVENOUS | Status: AC
  Administered 2018-10-03: 2.5 mg via INTRAVENOUS
  Filled 2018-10-03: qty 5

## 2018-10-03 MED ORDER — METHYLPREDNISOLONE 4 MG PO TBPK: ORAL_TABLET | ORAL | 0 refills | Status: DC

## 2018-10-03 MED ORDER — MORPHINE SULFATE (PF) 4 MG/ML IV SOLN
6.0000 mg | Freq: Once | INTRAVENOUS | Status: AC
  Administered 2018-10-03: 6 mg via INTRAVENOUS
  Filled 2018-10-03: qty 2

## 2018-10-03 NOTE — ED Notes (Signed)
Pt ambulated approx. 60 ft in hallway with walker. Tolerated well

## 2018-10-03 NOTE — ED Notes (Signed)
Pt discharged from ED; instructions provided and scripts given; Pt encouraged to return to ED if symptoms worsen and to f/u with PCP; Pt verbalized understanding of all instructions 

## 2018-10-03 NOTE — ED Provider Notes (Addendum)
[facility] EMERGENCY DEPARTMENT Provider Note   CSN: [id number] Arrival date & time: 10/02/18  2021     History   Chief Complaint Chief Complaint  Patient presents with  . Back Pain    lower    HPI Derrick [name] is a 84 y.o. male.  HPI 84 year old male with past medical history as below including CAD, CKD, COPD, chronic back pain, here with severe back pain.  The patient states that for the last 3 weeks, he has had progressively worsening lower back pain.  He was seen by his PCP and has seen by orthopedics for this as well.  At both visits, he received a shot of steroids and was sent home without imaging.  He felt better for approximately 24 hours and his symptoms returned.  He states that his pain is bilateeral lower lumbar, aching throbbing and worse with weight bearing. He's had associated numbness/shooting pains down his L>R posterior legs. He fell 2/2 the pain 2 weeks ago and has had mild left knee pain as well. No fever or chills. No loss of bowel or bladder function. No saddle anesthesia. Sx improved with steorids x 1 day, no other alleviating factors.  Past Medical History:  Diagnosis Date  . Anemia   . Anxiety   . Arthritis, rheumatoid ([place])   . BPH (benign prostatic hyperplasia)   . CAD (coronary artery disease)   . CKD (chronic kidney disease)   . COPD (chronic obstructive pulmonary disease) ([place])   . Depression   . Diverticulosis   . Elevated PSA   . Gangrene ([place])    of toe right foot  . GERD (gastroesophageal reflux disease)   . Herpes simplex labialis   . Hiatal hernia   . High cholesterol   . History of colonic polyps   . Hypertension   . Insomnia   . Lacunar infarction ([place])   . Memory loss   . Obesity   . Panic attacks   . Prostate cancer ([place])   . Renal cell cancer ([place])    s/p nephrectomy in 2000  . Seropositive rheumatoid arthritis ([place])   . Spermatocele   . Tinnitus   . Tremor of both hands     Patient Active Problem List    Diagnosis Date Noted  . Ulcer of right foot limited to breakdown of skin ([place])   . PAD (peripheral artery disease) ([place]) 09/23/2017  . Syncope and collapse 09/19/2017  . Osteomyelitis ([place])   . Malaise 09/09/2017  . Anorexia 09/09/2017  . Hypotension 09/09/2017  . Volume depletion 09/09/2017  . AKI (acute kidney injury) ([place]) 09/09/2017  . Hyponatremia 06/12/2017  . COPD (chronic obstructive pulmonary disease) ([name])   . Lacunar infarction ([place])   . Gangrene of toe of right foot ([place])   . Pre-syncope 11/21/2016  . Dyspnea on exertion 03/03/2016  . H/O unilateral Rt nephrectomy 03/03/2016  . Asymmetrical right sensorineural hearing loss 02/24/2016  . Bilateral hearing loss 02/24/2016  . Bilateral impacted cerumen 02/24/2016  . Arrhythmia 11/09/2015  . CAD (coronary artery disease) 11/09/2015  . Abnormal ECG 10/22/2013  . Dyspnea 10/22/2013  . Chest pain at rest 11/18/2011  . Unstable angina ([place]) 11/18/2011  . Thrombocytopenia ([place]) 11/18/2011  . Anemia 11/18/2011  . CKD (chronic kidney disease), stage III ([place]) 11/18/2011  . Hypertension   . High cholesterol   . GERD (gastroesophageal reflux disease)   . BPH (benign prostatic hyperplasia)     Past Surgical History:  Procedure  Laterality Date  . AMPUTATION TOE Right 06/13/2017   Procedure: AMPUTATION TOE 1st and 2nd;  Surgeon: [name], DPM;  Location: [place];  Service: Podiatry;  Laterality: Right;  . AMPUTATION TOE    . BACK SURGERY    . COLONOSCOPY    . GRAFT APPLICATION Right [id number]/62/3762   Procedure: EPIFIX GRAFT APPLICATION OF RIGHT FOOT;  Surgeon: [name], DPM;  Location: [place];  Service: Podiatry;  Laterality: Right;  . I&D EXTREMITY Right 09/13/2017   Procedure: IRRIGATION AND DEBRIDEMENT AND BONE BIOPSY;  Surgeon: [name], DPM;  Location: [place];  Service: Podiatry;  Laterality: Right;  . INCISION AND DRAINAGE OF WOUND Right 12/18/2017   Procedure: IRRIGATION AND DEBRIDEMENT WOUND ON RIGHT  FOOT;  Surgeon: [name], DPM;  Location: [place];  Service: Podiatry;  Laterality: Right;  . IR ANGIOGRAM EXTREMITY RIGHT  05/30/2017  . IR ANGIOGRAM EXTREMITY RIGHT  08/08/2017  . IR ANGIOGRAM SELECTIVE EACH ADDITIONAL VESSEL  08/08/2017  . IR ANGIOGRAM SELECTIVE EACH ADDITIONAL VESSEL  08/08/2017  . IR RADIOLOGIST EVAL & MGMT  05/16/2017  . IR TIB-PERO ART PTA MOD SED  05/30/2017  . IR TIB-PERO ART PTA MOD SED  08/08/2017  . IR TIB-PERO ART UNI PTA EA ADD VESSEL MOD SED  05/30/2017  . IR TIB-PERO ART UNI PTA EA ADD VESSEL MOD SED  08/08/2017  . IR US GUIDE VASC ACCESS RIGHT  05/30/2017  . IR US GUIDE VASC ACCESS RIGHT  08/08/2017  . LEFT HEART CATHETERIZATION WITH CORONARY ANGIOGRAM N/A 11/19/2011   Procedure: LEFT HEART CATHETERIZATION WITH CORONARY ANGIOGRAM;  Surgeon: [name], MD;  Location: [facility] C[facility];  Service: Cardiovascular;  Laterality: N/A;  . NEPHRECTOMY  10/1998  . TRANSMETATARSAL AMPUTATION Right 08/14/2017   Procedure: TRANSMETATARSAL AMPUTATION;  Surgeon: [name], DPM;  Location: [place];  Service: Podiatry;  Laterality: Right;        Home Medications    Prior to Admission medications   Medication Sig Start Date End Date Taking? Authorizing Provider  amLODipine (NORVASC) 5 MG tablet Take 1 tablet (5 mg total) by mouth daily. SCHEDULE OV FOR FURTHER REFILLS. 09/17/18  Yes Croitoru, Mihai, MD  aspirin EC 81 MG tablet Take 81 mg by mouth at bedtime.    Yes [provider]  clopidogrel (PLAVIX) 75 MG tablet Take 75 mg by mouth daily.   Yes [provider]  finasteride (PROSCAR) 5 MG tablet Take 5 mg by mouth daily.   Yes [provider]  gabapentin (NEURONTIN) 100 MG capsule Take 1 capsule (100 mg total) by mouth 3 (three) times daily. Patient taking differently: Take 100 mg by mouth at bedtime.  06/11/18  Yes Mesner, Corene Cornea, MD  hydroxychloroquine (PLAQUENIL) 200 MG tablet Take 200 mg by mouth daily.   Yes [provider]    Multiple Vitamin (MULTIVITAMIN WITH MINERALS) TABS tablet Take 1 tablet by mouth daily.   Yes [provider]  omega-3 acid ethyl esters (LOVAZA) 1 g capsule Take 2,000 mg by mouth daily after breakfast.   Yes [provider]  pantoprazole (PROTONIX) 40 MG tablet Take 40 mg by mouth daily.    Yes [provider]  predniSONE (DELTASONE) 5 MG tablet Take 5 mg by mouth daily with breakfast.   Yes [provider]  sertraline (ZOLOFT) 25 MG tablet Take 25 mg by mouth daily.   Yes [provider]  traMADol (ULTRAM) 50 MG tablet Take 1  tablet (50 mg total) by mouth every 6 (six) hours as needed for moderate pain. Patient taking differently: Take 50 mg by mouth daily as needed for moderate pain. Takes in the morning when needed. 04/29/18  Yes Daleen Bo, MD  traZODone (DESYREL) 50 MG tablet Take 75 mg by mouth at bedtime as needed for sleep.   Yes [provider]  docusate sodium 250 MG CAPS Take 250 mg by mouth daily for 7 days. 10/03/18 10/10/18  Duffy Bruce, MD  lidocaine (LIDODERM) 5 % Place 1 patch onto the skin daily for 7 days. Remove & Discard patch within 12 hours or as directed by MD 10/03/18 10/10/18  Duffy Bruce, MD  methylPREDNISolone (MEDROL DOSEPAK) 4 MG TBPK tablet Take as directed on package 10/03/18   Duffy Bruce, MD  oxyCODONE (ROXICODONE) 5 MG immediate release tablet Take 0.5-1 tablets (2.5-5 mg total) by mouth every 6 (six) hours as needed for moderate pain or severe pain (0.5 tablets for moderate pain, 1 tablet for severe pain). 10/03/18   Duffy Bruce, MD  oxyCODONE-acetaminophen (PERCOCET) 5-325 MG tablet Take 1 tablet by mouth every 8 (eight) hours as needed. Patient not taking: Reported on 10/03/2018 06/11/18   Mesner, Corene Cornea, MD  predniSONE (DELTASONE) 20 MG tablet 3 tabs po daily x 3 days, then 2 tabs x 3 days, then 1.5 tabs x 3 days, then 1 tab x 3 days, then 0.5 tabs x 3 days Patient not taking: Reported on  10/03/2018 06/11/18   Mesner, Corene Cornea, MD    Family History Family History  Problem Relation Age of Onset  . Heart attack Father 39  . Stroke Father   . Kidney failure Sister        Bright's disease   . Cancer Paternal Aunt        d. 12s; NOS cancer of leg  . Kidney disease Sister        d. 64; "Bright's disease" - possible kidney cancer?  . Cancer Sister        NOS cancer; d. 13s; "sick in the hospital"  . Heart attack Son 57  . Leukemia Son 69  . COPD Son   . Breast cancer Daughter 71    Social History Social History   Tobacco Use  . Smoking status: Former Smoker    Packs/day: 1.00    Years: 29.00    Pack years: 29.00    Types: Cigarettes    Last attempt to quit: 10/08/1978    Years since quitting: 40.0  . Smokeless tobacco: Never Used  . Tobacco comment: tried smokeless tobacco once but made him sick  Substance Use Topics  . Alcohol use: No    Comment: drank very little in his life - only socially   . Drug use: No     Allergies   Ace inhibitors   Review of Systems Review of Systems  Constitutional: Positive for fatigue. Negative for chills and fever.  HENT: Negative for congestion and rhinorrhea.   Eyes: Negative for visual disturbance.  Respiratory: Negative for cough, shortness of breath and wheezing.   Cardiovascular: Negative for chest pain and leg swelling.  Gastrointestinal: Negative for abdominal pain, diarrhea, nausea and vomiting.  Genitourinary: Negative for dysuria and flank pain.  Musculoskeletal: Positive for back pain. Negative for neck pain and neck stiffness.  Skin: Negative for rash and wound.  Allergic/Immunologic: Negative for immunocompromised state.  Neurological: Positive for weakness and numbness. Negative for syncope and headaches.  All other systems reviewed and  are negative.    Physical Exam Updated Vital Signs BP (!) 135/101   Pulse 97   Temp 98.2 F (36.8 C) (Oral)   Resp 15   SpO2 96%   Physical Exam Vitals signs and  nursing note reviewed.  Constitutional:      General: He is not in acute distress.    Appearance: He is well-developed.  HENT:     Head: Normocephalic and atraumatic.  Eyes:     Conjunctiva/sclera: Conjunctivae normal.  Neck:     Musculoskeletal: Neck supple.  Cardiovascular:     Rate and Rhythm: Normal rate and regular rhythm.     Heart sounds: Normal heart sounds. No murmur. No friction rub.     Comments: Frequent extrasystoles Pulmonary:     Effort: Pulmonary effort is normal. No respiratory distress.     Breath sounds: Normal breath sounds. No wheezing or rales.  Abdominal:     General: There is no distension.     Palpations: Abdomen is soft.     Tenderness: There is no abdominal tenderness.  Skin:    General: Skin is warm.     Capillary Refill: Capillary refill takes less than 2 seconds.  Neurological:     Mental Status: He is alert and oriented to person, place, and time.     Motor: No abnormal muscle tone.     Spine Exam: Inspection/Palpation: Moderate paraspinal TTP b/l lower lumbar spine, minimal midline TTP Strength: 5/5 throughout LE bilaterally (hip flexion/extension, adduction/abduction; knee flexion/extension; foot dorsiflexion/plantarflexion, inversion/eversion; great toe inversion) Sensation: Intact to light touch in proximal and distal LE bilaterally Reflexes: 2+ quadriceps and achilles reflexes  ED Treatments / Results  Labs (all labs ordered are listed, but only abnormal results are displayed) Labs Reviewed  CBC WITH DIFFERENTIAL/PLATELET - Abnormal; Notable for the following components:      Result Value   Hemoglobin 12.3 (*)    HCT 38.2 (*)    All other components within normal limits  BASIC METABOLIC PANEL - Abnormal; Notable for the following components:   Sodium 134 (*)    Glucose, Bld 107 (*)    Creatinine, Ser 1.31 (*)    GFR calc non Af Amer 50 (*)    GFR calc Af Amer 58 (*)    All other components within normal limits  I-STAT CHEM 8, ED  - Abnormal; Notable for the following components:   Sodium 133 (*)    Glucose, Bld 102 (*)    Hemoglobin 12.6 (*)    HCT 37.0 (*)    All other components within normal limits  SEDIMENTATION RATE  C-REACTIVE PROTEIN    EKG EKG Interpretation  Date/Time:  Friday October 03 2018 01:17:31 EST Ventricular Rate:  105 PR Interval:    QRS Duration: 103 QT Interval:  355 QTC Calculation: 470 R Axis:   34 Text Interpretation:  Sinus rhythm with frequent pACs RSR' in V1 or V2, right VCD or RVH Nonspecific T abnormalities, lateral leads No significant change since last tracing Confirmed by Duffy Bruce [phone]) on 10/03/2018 7:37:01 PM   Radiology Ct L-spine No Charge  Result Date: 10/03/2018 CLINICAL DATA:  Recurrent low back and lower extremity pain for 2 weeks. EXAM: CT LUMBAR SPINE WITHOUT CONTRAST TECHNIQUE: Multidetector CT imaging of the lumbar spine were derived from today's CT angiogram. Multiplanar CT image reconstructions were also generated. COMPARISON:  CT lumbar spine June 11, 2018 FINDINGS: SEGMENTATION: For the purposes of this report the last well-formed intervertebral disc space is  reported as L5-S1. ALIGNMENT: Maintained lumbar lordosis. Mild upper lumbar levoscoliosis. Grade 1 L4-5 anterolisthesis and grade 1 L5-S1 anterolisthesis, unchanged. VERTEBRAE: Vertebral bodies intact. Severe L1-2, L2-3 and L5-S1 disc height loss, moderate L4-5 with vacuum disc all lumbar levels and multilevel severe endplate sclerosis and spurring. No destructive bony lesions. PARASPINAL AND OTHER SOFT TISSUES: Nonacute. Absent RIGHT kidney. Aortoiliac calcifications. CT angiogram from same day, reported separately for dedicated findings. DISC LEVELS: T12-L1: No disc bulge, canal stenosis nor neural foraminal narrowing. Mild facet arthropathy. L1-2: Moderate broad-based disc osteophyte complex. Mild facet arthropathy and ligamentum flavum redundancy. Mild canal stenosis. Moderate to severe  bilateral neural foraminal narrowing. L2-3: Moderate broad-based disc osteophyte complex asymmetric to the RIGHT. Mild facet arthropathy with asymmetrically thickened RIGHT ligamentum flavum. Moderate canal stenosis with narrowed RIGHT lateral recess which may affect the traversing RIGHT L3 nerve. Moderate RIGHT greater LEFT neural foraminal narrowing. L3-4: Moderate broad-based disc osteophyte complex, mild facet arthropathy and ligamentum flavum redundancy. Mild canal stenosis. Moderate bilateral neural foraminal narrowing. L4-5: Anterolisthesis. Severe facet arthropathy. Small broad-based disc osteophyte complex. Mild canal stenosis. Moderate to severe bilateral neural foraminal narrowing. L5-S1: Anterolisthesis. Posterior decompression. Incorporated posterior bone graft material without canal stenosis. Moderate bilateral neural foraminal narrowing. IMPRESSION: 1. No fracture. Stable grade 1 L4-5 and L5-S1 anterolisthesis. Status post L5-S1 laminectomies. 2. Moderate canal stenosis L2-3. Mild canal stenosis L1-2, L3-4 and L4-5. 3. Neural foraminal narrowing all lumbar levels: Moderate to severe at L1-2 and L4-5. Aortic Atherosclerosis (ICD10-I70.0). Electronically Signed   By: Elon Alas M.D.   On: 10/03/2018 04:38   Dg Knee Complete 4 Views Left  Result Date: 10/02/2018 CLINICAL DATA:  Anterior left knee pain and wound after falling 2 weeks ago. EXAM: LEFT KNEE - COMPLETE 4+ VIEW COMPARISON:  None. FINDINGS: Chondrocalcinosis of hyaline cartilage is noted within the femorotibial compartment. There is mild-to-moderate femorotibial joint space narrowing along the medial aspect. The patellofemoral compartment is intact with slight joint space narrowing inferiorly. No joint effusion or fracture. Femoral through tibial as well as branch vessel arteriosclerosis is identified. No soft tissue emphysema or mass. No significant soft tissue swelling. No bone destruction. IMPRESSION: Osteoarthritis of the left  knee with mild tricompartmental osteoarthritis. No acute fracture or bone destruction. Electronically Signed   By: Ashley Royalty M.D.   On: 10/02/2018 21:54   Ct Angio Abd/pel W And/or Wo Contrast  Result Date: 10/03/2018 CLINICAL DATA:  Subacute onset of lower back pain and bilateral leg pain. EXAM: CTA ABDOMEN AND PELVIS wITHOUT AND WITH CONTRAST TECHNIQUE: Multidetector CT imaging of the abdomen and pelvis was performed using the standard protocol during bolus administration of intravenous contrast. Multiplanar reconstructed images and MIPs were obtained and reviewed to evaluate the vascular anatomy. CONTRAST:  162mL ISOVUE-370 IOPAMIDOL (ISOVUE-370) INJECTION 76% COMPARISON:  CT of the abdomen and pelvis performed 06/11/2018 FINDINGS: VASCULAR Aorta: Mild scattered calcification is seen along the abdominal aorta and its branches. There is no evidence of aneurysmal dilatation. There is no evidence of aortic dissection. Celiac: Calcification is seen at the celiac trunk, without significant luminal narrowing. SMA: Mild calcification is noted at the proximal superior mesenteric artery, without significant luminal narrowing. Renals: The patient is status post right-sided nephrectomy. The left renal artery remains patent. IMA: The inferior mesenteric artery remains patent. Inflow: Scattered calcification is seen along the common, internal and external iliac arteries bilaterally. Proximal Outflow: Scattered calcification is seen along the common femoral arteries and their proximal branches bilaterally. Veins: Visualized venous structures appear  grossly intact. Review of the MIP images confirms the above findings. NON-VASCULAR Lower chest: Minimal scarring is noted at the right lung base. The visualized portions of the mediastinum are unremarkable. Hepatobiliary: The liver is unremarkable in appearance. The gallbladder is unremarkable in appearance. The common bile duct remains normal in caliber. Pancreas: The  pancreas is within normal limits. Spleen: The spleen is unremarkable in appearance. Adrenals/Urinary Tract: The adrenal glands are unremarkable in appearance. The left kidney is grossly unremarkable in appearance. Mild left-sided perinephric stranding is noted. The patient is status post right-sided nephrectomy. There is no evidence of hydronephrosis. No renal or ureteral stones are identified. Stomach/Bowel: The stomach is unremarkable in appearance. The small bowel is within normal limits. The appendix is normal in caliber, without evidence of appendicitis. The colon is unremarkable in appearance. Lymphatic: No retroperitoneal or pelvic sidewall lymphadenopathy is seen. Reproductive: The bladder is significantly distended and grossly unremarkable. The prostate is enlarged, measuring 5.9 cm in transverse dimension. Other: No additional soft tissue abnormalities are seen. Musculoskeletal: No acute osseous abnormalities are identified. Multilevel vacuum phenomenon is noted along the lumbar spine. There is grade 1 anterolisthesis of L4 on L5. Underlying decompression is noted. The visualized musculature is unremarkable in appearance. IMPRESSION: VASCULAR No evidence of aortic dissection. No evidence of aneurysmal dilatation. NON-VASCULAR 1. No acute abnormality seen within the abdomen or pelvis. 2. Enlarged prostate.  Would correlate with PSA. 3. Degenerative change along the lumbar spine. Aortic Atherosclerosis (ICD10-I70.0). Electronically Signed   By: Garald Balding M.D.   On: 10/03/2018 04:41    Procedures Procedures (including critical care time)  Medications Ordered in ED Medications  iopamidol (ISOVUE-370) 76 % injection (has no administration in time range)  morphine 4 MG/ML injection 6 mg (6 mg Intravenous Given 10/03/18 0202)  ondansetron (ZOFRAN) injection 4 mg (4 mg Intravenous Given 10/03/18 0202)  iopamidol (ISOVUE-370) 76 % injection 100 mL (100 mLs Intravenous Contrast Given 10/03/18 0340)    dexamethasone (DECADRON) injection 10 mg (10 mg Intravenous Given 10/03/18 0505)  ketorolac (TORADOL) 15 MG/ML injection 15 mg (15 mg Intravenous Given 10/03/18 0508)  morphine 4 MG/ML injection 4 mg (4 mg Intravenous Given 10/03/18 0511)  metoprolol tartrate (LOPRESSOR) injection 2.5 mg (2.5 mg Intravenous Given 10/03/18 0549)     Initial Impression / Assessment and Plan / ED Course  I have reviewed the triage vital signs and the nursing notes.  Pertinent labs & imaging results that were available during my care of the patient were reviewed by me and considered in my medical decision making (see chart for details).     84 yo M with PMHx as above here w/ severe lower back pain and bilateral paresthesias. Suspect acute on chronic lumbar DDD and spinal stenosis, likely exacerbated by recent increased movement prior to sx onset. No LE weakness, numbness, and strength, sensation, reflexes intact on my exam w/o signs of cauda equina or cord compression. Labs reassuring - no WBC, no fever, no signs of infectious or inflammatory process. Will check CT, also Angio given possible bruit on exam, HTN, for eval of dissection or AAA. No CP.   CT scan is concerning for severe lumbar degen disease with foraminal stenosis. No severe central stenosis or signs of bony pathology. He feels markedly improved w/ treatment in ED and is ambulatory without difficulty.  Of note, pt with frequent ectopy on monitor. Has been noted previously and responded well to beta blocker. Lopressor 2.5 given with good effect. He remains HDS. HR  in 60-80s on discharge. D/c with outpt follow-up. Does not appear to be AFib, hold on anticoagulation.  Will d/c with outpt steroids, analgesia, and oupt referral for possible steroid injections. Return precautions given.  Final Clinical Impressions(s) / ED Diagnoses   Final diagnoses:  DDD (degenerative disc disease), lumbar  Neural foraminal stenosis of lumbar spine    ED Discharge  Orders         Ordered    methylPREDNISolone (MEDROL DOSEPAK) 4 MG TBPK tablet     10/03/18 0610    oxyCODONE (ROXICODONE) 5 MG immediate release tablet  Every 6 hours PRN     10/03/18 0610    lidocaine (LIDODERM) 5 %  Every 24 hours     10/03/18 0610    docusate sodium 250 MG CAPS  Daily     10/03/18 0612           Duffy Bruce, MD 10/03/18 [id number]    Duffy Bruce, MD 10/03/18 [phone]

## 2018-10-03 NOTE — Discharge Instructions (Signed)
Your CT scan today shows significant degenerative disease of your lower back. This is likely causing your symptoms.  If you develop persistent numbness or weakness that does not resolve, return to the ER.  If you develop loss of bowel or bladder function, return to the Er immediately

## 2018-10-03 NOTE — ED Notes (Signed)
Pt showing signs of a-fib. ECG obtained and MD aware

## 2018-10-05 ENCOUNTER — Other Ambulatory Visit: Payer: Self-pay

## 2018-10-05 ENCOUNTER — Emergency Department (HOSPITAL_COMMUNITY): Payer: Medicare Other

## 2018-10-05 ENCOUNTER — Encounter (HOSPITAL_COMMUNITY): Payer: Self-pay

## 2018-10-05 ENCOUNTER — Emergency Department (HOSPITAL_COMMUNITY)
Admission: EM | Admit: 2018-10-05 | Discharge: 2018-10-05 | Disposition: A | Discharge status: Home / Self Care | Payer: Medicare Other | Attending: Emergency Medicine | Admitting: Emergency Medicine

## 2018-10-05 DIAGNOSIS — Z8546 Personal history of malignant neoplasm of prostate: Secondary | ICD-10-CM | POA: Diagnosis not present

## 2018-10-05 DIAGNOSIS — I129 Hypertensive chronic kidney disease with stage 1 through stage 4 chronic kidney disease, or unspecified chronic kidney disease: Secondary | ICD-10-CM | POA: Insufficient documentation

## 2018-10-05 DIAGNOSIS — Z7982 Long term (current) use of aspirin: Secondary | ICD-10-CM | POA: Insufficient documentation

## 2018-10-05 DIAGNOSIS — I2510 Atherosclerotic heart disease of native coronary artery without angina pectoris: Secondary | ICD-10-CM | POA: Diagnosis not present

## 2018-10-05 DIAGNOSIS — Z79899 Other long term (current) drug therapy: Secondary | ICD-10-CM | POA: Insufficient documentation

## 2018-10-05 DIAGNOSIS — Z87891 Personal history of nicotine dependence: Secondary | ICD-10-CM | POA: Diagnosis not present

## 2018-10-05 DIAGNOSIS — M13862 Other specified arthritis, left knee: Secondary | ICD-10-CM | POA: Insufficient documentation

## 2018-10-05 DIAGNOSIS — E7800 Pure hypercholesterolemia, unspecified: Secondary | ICD-10-CM | POA: Insufficient documentation

## 2018-10-05 DIAGNOSIS — N183 Chronic kidney disease, stage 3 (moderate): Secondary | ICD-10-CM | POA: Insufficient documentation

## 2018-10-05 DIAGNOSIS — R52 Pain, unspecified: Secondary | ICD-10-CM | POA: Diagnosis not present

## 2018-10-05 DIAGNOSIS — J449 Chronic obstructive pulmonary disease, unspecified: Secondary | ICD-10-CM | POA: Insufficient documentation

## 2018-10-05 DIAGNOSIS — M1712 Unilateral primary osteoarthritis, left knee: Secondary | ICD-10-CM

## 2018-10-05 DIAGNOSIS — M25562 Pain in left knee: Secondary | ICD-10-CM | POA: Diagnosis not present

## 2018-10-05 DIAGNOSIS — S8992XA Unspecified injury of left lower leg, initial encounter: Secondary | ICD-10-CM | POA: Diagnosis not present

## 2018-10-05 MED ORDER — DICLOFENAC SODIUM 1 % TD GEL: 2.0000 g | Freq: Four times a day (QID) | TRANSDERMAL | 0 refills | Status: DC

## 2018-10-05 MED ORDER — OXYCODONE-ACETAMINOPHEN 5-325 MG PO TABS
1.0000 | ORAL_TABLET | Freq: Once | ORAL | Status: AC
  Administered 2018-10-05: 1 via ORAL
  Filled 2018-10-05: qty 1

## 2018-10-05 MED ORDER — METHOCARBAMOL 500 MG PO TABS: 500.0000 mg | ORAL_TABLET | Freq: Two times a day (BID) | ORAL | 0 refills | Status: DC

## 2018-10-05 NOTE — ED Notes (Signed)
Patient transported to X-ray 

## 2018-10-05 NOTE — Discharge Instructions (Signed)
You will need to follow-up with your orthopedic surgeon for further management. Return to ED for worsening symptoms, injuries or falls, increased swelling, redness of your joint, increased back pain, losing control of your bowels or bladder.

## 2018-10-05 NOTE — ED Notes (Signed)
Pt stable, ambulatory, states understanding of discharge instructions 

## 2018-10-05 NOTE — ED Provider Notes (Signed)
[facility] EMERGENCY DEPARTMENT Provider Note   CSN: [id number] Arrival date & time: 10/05/18  1219     History   Chief Complaint Chief Complaint  Patient presents with  . Knee Pain    HPI Derrick [name] is a 84 y.o. male with a past medical history of osteoarthritis, rheumatoid arthritis, hypertension, who presents to ED for left knee pain.  Patient appears to be a poor historian so I am unable to get a clear history.  Patient was seen and evaluated 3 days ago after a fall.  Apparently, he is fallen 3 times since then.  He is unable to tell me the reason he fell but he did deny any loss of consciousness or head injury.  He states that the pain medication that were given to him worked but that he ran out.  He met with an orthopedic surgeon last month and states that "they did something on it really know what they did."  Denies any redness, fever, swelling.  HPI  Past Medical History:  Diagnosis Date  . Anemia   . Anxiety   . Arthritis, rheumatoid ([place])   . BPH (benign prostatic hyperplasia)   . CAD (coronary artery disease)   . CKD (chronic kidney disease)   . COPD (chronic obstructive pulmonary disease) ([place])   . Depression   . Diverticulosis   . Elevated PSA   . Gangrene ([place])    of toe right foot  . GERD (gastroesophageal reflux disease)   . Herpes simplex labialis   . Hiatal hernia   . High cholesterol   . History of colonic polyps   . Hypertension   . Insomnia   . Lacunar infarction ([place])   . Memory loss   . Obesity   . Panic attacks   . Prostate cancer ([place])   . Renal cell cancer ([place])    s/p nephrectomy in 2000  . Seropositive rheumatoid arthritis ([place])   . Spermatocele   . Tinnitus   . Tremor of both hands     Patient Active Problem List   Diagnosis Date Noted  . Ulcer of right foot limited to breakdown of skin ([place])   . PAD (peripheral artery disease) ([place]) 09/23/2017  . Syncope and collapse 09/19/2017  . Osteomyelitis ([place])   .  Malaise 09/09/2017  . Anorexia 09/09/2017  . Hypotension 09/09/2017  . Volume depletion 09/09/2017  . AKI (acute kidney injury) ([place]) 09/09/2017  . Hyponatremia 06/12/2017  . COPD (chronic obstructive pulmonary disease) ([place])   . Lacunar infarction ([place])   . Gangrene of toe of right foot ([place])   . Pre-syncope 11/21/2016  . Dyspnea on exertion 03/03/2016  . H/O unilateral Rt nephrectomy 03/03/2016  . Asymmetrical right sensorineural hearing loss 02/24/2016  . Bilateral hearing loss 02/24/2016  . Bilateral impacted cerumen 02/24/2016  . Arrhythmia 11/09/2015  . CAD (coronary artery disease) 11/09/2015  . Abnormal ECG 10/22/2013  . Dyspnea 10/22/2013  . Chest pain at rest 11/18/2011  . Unstable angina ([place]) 11/18/2011  . Thrombocytopenia ([place]) 11/18/2011  . Anemia 11/18/2011  . CKD (chronic kidney disease), stage III ([place]) 11/18/2011  . Hypertension   . High cholesterol   . GERD (gastroesophageal reflux disease)   . BPH (benign prostatic hyperplasia)     Past Surgical History:  Procedure Laterality Date  . AMPUTATION TOE Right 06/13/2017   Procedure: AMPUTATION TOE 1st and 2nd;  Surgeon: [name], DPM;  Location: [facility];  Service: Podiatry;  Laterality:  Right;  . AMPUTATION TOE    . BACK SURGERY    . COLONOSCOPY    . GRAFT APPLICATION Right 12/18/2017   Procedure: EPIFIX GRAFT APPLICATION OF RIGHT FOOT;  Surgeon: Price, Michael J, DPM;  Location: [facility];  Service: Podiatry;  Laterality: Right;  . I&D EXTREMITY Right 09/13/2017   Procedure: IRRIGATION AND DEBRIDEMENT AND BONE BIOPSY;  Surgeon: Price, Michael J, DPM;  Location: [facility] OR;  Service: Podiatry;  Laterality: Right;  . INCISION AND DRAINAGE OF WOUND Right 12/18/2017   Procedure: IRRIGATION AND DEBRIDEMENT WOUND ON RIGHT FOOT;  Surgeon: Price, Michael J, DPM;  Location: [facility] OR;  Service: Podiatry;  Laterality: Right;  . IR ANGIOGRAM EXTREMITY RIGHT  05/30/2017  . IR ANGIOGRAM EXTREMITY RIGHT  08/08/2017  . IR ANGIOGRAM  SELECTIVE EACH ADDITIONAL VESSEL  08/08/2017  . IR ANGIOGRAM SELECTIVE EACH ADDITIONAL VESSEL  08/08/2017  . IR RADIOLOGIST EVAL & MGMT  05/16/2017  . IR TIB-PERO ART PTA MOD SED  05/30/2017  . IR TIB-PERO ART PTA MOD SED  08/08/2017  . IR TIB-PERO ART UNI PTA EA ADD VESSEL MOD SED  05/30/2017  . IR TIB-PERO ART UNI PTA EA ADD VESSEL MOD SED  08/08/2017  . IR US GUIDE VASC ACCESS RIGHT  05/30/2017  . IR US GUIDE VASC ACCESS RIGHT  08/08/2017  . LEFT HEART CATHETERIZATION WITH CORONARY ANGIOGRAM N/A 11/19/2011   Procedure: LEFT HEART CATHETERIZATION WITH CORONARY ANGIOGRAM;  Surgeon: David W Harding, MD;  Location: [facility];  Service: Cardiovascular;  Laterality: N/A;  . NEPHRECTOMY  10/1998  . TRANSMETATARSAL AMPUTATION Right 08/14/2017   Procedure: TRANSMETATARSAL AMPUTATION;  Surgeon: Price, Michael J, DPM;  Location: [facility];  Service: Podiatry;  Laterality: Right;        Home Medications    Prior to Admission medications   Medication Sig Start Date End Date Taking? Authorizing Provider  amLODipine (NORVASC) 5 MG tablet Take 1 tablet (5 mg total) by mouth daily. SCHEDULE OV FOR FURTHER REFILLS. 09/17/18   Croitoru, Mihai, MD  aspirin EC 81 MG tablet Take 81 mg by mouth at bedtime.     [provider]  clopidogrel (PLAVIX) 75 MG tablet Take 75 mg by mouth daily.    [provider]  diclofenac sodium (VOLTAREN) 1 % GEL Apply 2 g topically 4 (four) times daily. 10/05/18   , , PA-C  docusate sodium 250 MG CAPS Take 250 mg by mouth daily for 7 days. 10/03/18 10/10/18  Isaacs, Cameron, MD  finasteride (PROSCAR) 5 MG tablet Take 5 mg by mouth daily.    [provider]  gabapentin (NEURONTIN) 100 MG capsule Take 1 capsule (100 mg total) by mouth 3 (three) times daily. Patient taking differently: Take 100 mg by mouth at bedtime.  06/11/18   Mesner, Jason, MD  hydroxychloroquine (PLAQUENIL) 200 MG tablet Take 200 mg by mouth daily.    [provider]  lidocaine  (LIDODERM) 5 % Place 1 patch onto the skin daily for 7 days. Remove & Discard patch within 12 hours or as directed by MD 10/03/18 10/10/18  Isaacs, Cameron, MD  methocarbamol (ROBAXIN) 500 MG tablet Take 1 tablet (500 mg total) by mouth 2 (two) times daily. 10/05/18   , , PA-C  methylPREDNISolone (MEDROL DOSEPAK) 4 MG TBPK tablet Take as directed on package 10/03/18   Isaacs, Cameron, MD  Multiple Vitamin (MULTIVITAMIN WITH MINERALS) TABS tablet Take 1 tablet by mouth daily.    [provider]  omega-3   acid ethyl esters (LOVAZA) 1 g capsule Take 2,000 mg by mouth daily after breakfast.    [provider]  oxyCODONE (ROXICODONE) 5 MG immediate release tablet Take 0.5-1 tablets (2.5-5 mg total) by mouth every 6 (six) hours as needed for moderate pain or severe pain (0.5 tablets for moderate pain, 1 tablet for severe pain). 10/03/18   Duffy Bruce, MD  oxyCODONE-acetaminophen (PERCOCET) 5-325 MG tablet Take 1 tablet by mouth every 8 (eight) hours as needed. Patient not taking: Reported on 10/03/2018 06/11/18   Mesner, Corene Cornea, MD  pantoprazole (PROTONIX) 40 MG tablet Take 40 mg by mouth daily.     [provider]  predniSONE (DELTASONE) 20 MG tablet 3 tabs po daily x 3 days, then 2 tabs x 3 days, then 1.5 tabs x 3 days, then 1 tab x 3 days, then 0.5 tabs x 3 days Patient not taking: Reported on 10/03/2018 06/11/18   Mesner, Corene Cornea, MD  predniSONE (DELTASONE) 5 MG tablet Take 5 mg by mouth daily with breakfast.    [provider]  sertraline (ZOLOFT) 25 MG tablet Take 25 mg by mouth daily.    [provider]  traMADol (ULTRAM) 50 MG tablet Take 1 tablet (50 mg total) by mouth every 6 (six) hours as needed for moderate pain. Patient taking differently: Take 50 mg by mouth daily as needed for moderate pain. Takes in the morning when needed. 04/29/18   Daleen Bo, MD  traZODone (DESYREL) 50 MG tablet Take 75 mg by mouth at bedtime as needed for sleep.     [provider]    Family History Family History  Problem Relation Age of Onset  . Heart attack Father 18  . Stroke Father   . Kidney failure Sister        Bright's disease   . Cancer Paternal Aunt        d. 26s; NOS cancer of leg  . Kidney disease Sister        d. 58; "Bright's disease" - possible kidney cancer?  . Cancer Sister        NOS cancer; d. 62s; "sick in the hospital"  . Heart attack Son 42  . Leukemia Son 55  . COPD Son   . Breast cancer Daughter 54    Social History Social History   Tobacco Use  . Smoking status: Former Smoker    Packs/day: 1.00    Years: 29.00    Pack years: 29.00    Types: Cigarettes    Last attempt to quit: 10/08/1978    Years since quitting: 40.0  . Smokeless tobacco: Never Used  . Tobacco comment: tried smokeless tobacco once but made him sick  Substance Use Topics  . Alcohol use: No    Comment: drank very little in his life - only socially   . Drug use: No     Allergies   Ace inhibitors   Review of Systems Review of Systems  Constitutional: Negative for chills and fever.  Musculoskeletal: Positive for arthralgias. Negative for myalgias.  Neurological: Negative for weakness and numbness.     Physical Exam Updated Vital Signs BP (!) 148/90 (BP Location: Right Arm)   Pulse (!) 52   Temp 98.1 F (36.7 C) (Oral)   Resp 16   Ht 6' (1.829 m)   Wt 76.7 kg   SpO2 96%   BMI 22.92 kg/m   Physical Exam Vitals signs and nursing note reviewed.  Constitutional:      General:  He is not in acute distress.    Appearance: He is well-developed. He is not diaphoretic.  HENT:     Head: Normocephalic and atraumatic.  Eyes:     General: No scleral icterus.    Conjunctiva/sclera: Conjunctivae normal.  Neck:     Musculoskeletal: Normal range of motion.  Pulmonary:     Effort: Pulmonary effort is normal. No respiratory distress.  Musculoskeletal:        General: Tenderness present. No swelling.     Comments: Tenderness  to palpation diffusely of the L knee. Able to perform straight leg raise without difficulty.  No deformity, overlying skin changes, warmth or edema of joint.  2+ DP pulse palpated.  Full active and passive range of motion of knee with pain noted.  Skin:    Findings: No rash.  Neurological:     Mental Status: He is alert.      ED Treatments / Results  Labs (all labs ordered are listed, but only abnormal results are displayed) Labs Reviewed - No data to display  EKG None  Radiology Dg Knee Complete 4 Views Left  Result Date: 10/05/2018 CLINICAL DATA:  Persistent left knee pain since a fall 3 weeks ago. EXAM: LEFT KNEE - COMPLETE 4+ VIEW COMPARISON:  Radiographs dated 10/02/2018 and 08/21/2018 FINDINGS: No evidence of fracture, dislocation, or joint effusion. Chondrocalcinosis. Extensive vascular calcifications. IMPRESSION: No acute abnormalities. Electronically Signed   By: Lorriane Shire M.D.   On: 10/05/2018 12:20    Procedures Procedures (including critical care time)  Medications Ordered in ED Medications  oxyCODONE-acetaminophen (PERCOCET/ROXICET) 5-325 MG per tablet 1 tablet (has no administration in time range)     Initial Impression / Assessment and Plan / ED Course  I have reviewed the triage vital signs and the nursing notes.  Pertinent labs & imaging results that were available during my care of the patient were reviewed by me and considered in my medical decision making (see chart for details).     84 year old male presents to ED for chronic left knee pain.  Patient is a poor historian.  He was seen and evaluated 2 days ago after a fall.  Unsure if he actually fell between the time that he left the ED 2 days ago and today.  He states that he misplaced his oxycodone tablets that he was given 2 days ago.  He has a follow-up appointment with his PCP in 2 days.  On exam there is diffuse tenderness to palpation of the left knee with no overlying skin changes or changes to  range of motion noted.  Area is neurovascularly intact.  X-ray is unremarkable.  Suspect that this is a flareup of his osteoarthritis.  Encouraged patient to follow-up with the orthopedist, will give Voltaren gel and muscle relaxer to help with his pain as he also endorses flareup of his sciatica secondary to his knee pain.  Will advise him to return to ED for any severe worsening symptoms. Patient discussed with my attending, Dr. Dayna Barker.  Patient is hemodynamically stable, in NAD, and able to ambulate in the ED. Evaluation does not show pathology that would require ongoing emergent intervention or inpatient treatment. I explained the diagnosis to the patient. Pain has been managed and has no complaints prior to discharge. Patient is comfortable with above plan and is stable for discharge at this time. All questions were answered prior to disposition. Strict return precautions for returning to the ED were discussed. Encouraged follow up with PCP.  Portions of this note were generated with [employer]obby[employer]. Dictation errors may occur despite best attempts at proofreading.   Final Clinical Impressions(s) / ED Diagnoses   Final diagnoses:  Arthritis of knee, left    ED Discharge Orders         Ordered    diclofenac sodium (VOLTAREN) 1 % GEL  4 times daily     10/05/18 1246    methocarbamol (ROBAXIN) 500 MG tablet  2 times daily     10/05/18 1246           Delia Heady, PA-C 10/05/18 1249    Mesner, Corene Cornea, MD 10/05/18 1334

## 2018-10-05 NOTE — ED Triage Notes (Signed)
Pt brought in by EMS due to having left knee pain. Pt seen a few days ago for the same.

## 2018-10-07 DIAGNOSIS — R29898 Other symptoms and signs involving the musculoskeletal system: Secondary | ICD-10-CM | POA: Diagnosis not present

## 2018-10-14 DIAGNOSIS — M25552 Pain in left hip: Secondary | ICD-10-CM | POA: Diagnosis not present

## 2018-10-14 DIAGNOSIS — M545 Low back pain: Secondary | ICD-10-CM | POA: Diagnosis not present

## 2018-10-16 ENCOUNTER — Telehealth: Payer: Self-pay

## 2018-10-16 ENCOUNTER — Other Ambulatory Visit: Payer: Self-pay | Admitting: Orthopedic Surgery

## 2018-10-16 DIAGNOSIS — M545 Low back pain, unspecified: Secondary | ICD-10-CM

## 2018-10-16 DIAGNOSIS — G8929 Other chronic pain: Secondary | ICD-10-CM

## 2018-10-16 NOTE — Telephone Encounter (Signed)
Spoke with patient and his son to screen patient's medications and allergies prior to getting Mr. Derrick [name] scheduled for a myelogram.  They understand we need to obtain clearance from Dr. [name] to hold Plavix for five days before he can be scheduled.  They also state an understanding the patient will need to hold Sertraline, Tramadol and Trazodone for 48 hours before, the day of and 24 hours after the myelogram.  Gypsy Lore, RN

## 2018-10-24 ENCOUNTER — Ambulatory Visit
Admission: RE | Admit: 2018-10-24 | Discharge: 2018-10-24 | Disposition: A | Discharge status: Home / Self Care | Payer: Medicare Other | Source: Ambulatory Visit | Attending: Orthopedic Surgery | Admitting: Orthopedic Surgery

## 2018-10-24 DIAGNOSIS — M545 Low back pain, unspecified: Secondary | ICD-10-CM

## 2018-10-24 DIAGNOSIS — G8929 Other chronic pain: Secondary | ICD-10-CM

## 2018-10-24 DIAGNOSIS — M48061 Spinal stenosis, lumbar region without neurogenic claudication: Secondary | ICD-10-CM | POA: Diagnosis not present

## 2018-10-24 MED ORDER — IOPAMIDOL (ISOVUE-M 200) INJECTION 41%
18.0000 mL | Freq: Once | INTRAMUSCULAR | Status: AC
  Administered 2018-10-24: 18 mL via INTRATHECAL

## 2018-10-24 MED ORDER — DIAZEPAM 5 MG PO TABS
5.0000 mg | ORAL_TABLET | Freq: Once | ORAL | Status: AC
  Administered 2018-10-24: 5 mg via ORAL

## 2018-10-24 MED ORDER — ONDANSETRON HCL 4 MG/2ML IJ SOLN: 4.0000 mg | Freq: Four times a day (QID) | INTRAMUSCULAR | Status: DC | PRN

## 2018-10-24 MED ORDER — MEPERIDINE HCL 50 MG/ML IJ SOLN
50.0000 mg | Freq: Once | INTRAMUSCULAR | Status: AC
  Administered 2018-10-24: 25 mg via INTRAMUSCULAR

## 2018-10-24 NOTE — Discharge Instructions (Signed)
Myelogram Discharge Instructions  1. Go home and rest quietly for the next 24 hours.  It is important to lie flat for the next 24 hours.  Get up only to go to the restroom.  You may lie in the bed or on a couch on your back, your stomach, your left side or your right side.  You may have one pillow under your head.  You may have pillows between your knees while you are on your side or under your knees while you are on your back.  2. DO NOT drive today.  Recline the seat as far back as it will go, while still wearing your seat belt, on the way home.  3. You may get up to go to the bathroom as needed.  You may sit up for 10 minutes to eat.  You may resume your normal diet and medications unless otherwise indicated.  Drink lots of extra fluids today and tomorrow.  4. The incidence of headache, nausea, or vomiting is about 5% (one in 20 patients).  If you develop a headache, lie flat and drink plenty of fluids until the headache goes away.  Caffeinated beverages may be helpful.  If you develop severe nausea and vomiting or a headache that does not go away with flat bed rest, call [phone].  5. You may resume normal activities after your 24 hours of bed rest is over; however, do not exert yourself strongly or do any heavy lifting tomorrow. If when you get up you have a headache when standing, go back to bed and force fluids for another 24 hours.  6. Call your physician for a follow-up appointment.  The results of your myelogram will be sent directly to your physician by the following day.  7. If you have any questions or if complications develop after you arrive home, please call [phone].  Discharge instructions have been explained to the patient.  The patient, or the person responsible for the patient, fully understands these instructions.  YOU MAY RESTART YOUR PLAVIX TODAY. YOU MAY RESTART YOUR TRAZODONE, TRAMADOL, AND SERTRALINE TOMORROW 10/25/2018 AT 10:30AM.

## 2018-10-28 ENCOUNTER — Telehealth: Payer: Self-pay

## 2018-10-28 DIAGNOSIS — M48061 Spinal stenosis, lumbar region without neurogenic claudication: Secondary | ICD-10-CM | POA: Diagnosis not present

## 2018-10-28 NOTE — Telephone Encounter (Signed)
   [facility] Pre-operative Risk Assessment    Request for surgical clearance:  1. What type of surgery is being performed? Lumbar Spinal Decompression  2. When is this surgery scheduled? TBD  3. What type of clearance is required (medical clearance vs. Pharmacy clearance to hold med vs. Both)? Both  4. Are there any medications that need to be held prior to surgery and how long? Plavix and Aspirin  5. Practice name and name of physician performing surgery? [facility]  Dr.Gioffre   6. What is your office phone number [phone]   7.   What is your office fax number [phone]  8.   Anesthesia type Not listed   Kathyrn Lass 10/28/2018, 2:55 PM  _________________________________________________________________   (provider comments below)

## 2018-10-29 NOTE — Telephone Encounter (Signed)
OK to hold ASA and plavix for 7 days preop M[facility]

## 2018-10-29 NOTE — Telephone Encounter (Signed)
appt made with Jory Sims 01/28 @ 8:30 am, pt made aware

## 2018-10-29 NOTE — Telephone Encounter (Signed)
   Primary Cardiologist:Mihai Croitoru, MD  Chart reviewed as part of pre-operative protocol coverage. Because of Derrick [name]'s past medical history and time since last visit, he/she will require a follow-up visit in order to better assess preoperative cardiovascular risk.  Pre-op covering staff: - Please schedule appointment and call patient to inform them. - Please contact requesting surgeon's office via preferred method (i.e, phone, fax) to inform them of need for appointment prior to surgery.  If applicable, this message will also be routed to pharmacy pool and/or primary cardiologist for input on holding anticoagulant/antiplatelet agent as requested below so that this information is available at time of patient's appointment.   Lyda Jester, PA-C  10/29/2018, 3:39 PM

## 2018-11-03 NOTE — Progress Notes (Signed)
Cardiology Office Note   Date:  11/04/2018   ID:  Derrick [name], DOB 08/30/1932, MRN [id number]  PCP:  Derrick Pretty, MD  Cardiologist:  Dr.Croitoru  No chief complaint on file.    History of Present Illness: Derrick [name] is a 85 y.o. male who presents for cardiology pre-operative evaluation for lumbar spinal decompression on date to TBD. He was cleared by phone note by Dr. Sallyanne [name] but had not been seen by cardiology for over a year and required face to face evaluation.   He has a history of hypertension, hypercholesterolemia, moderate chronic renal insufficiency, s/p nephrectomy for renal cell carcinoma, transmetatarsal amputation of the right foot, with osteomyelitis. He was unable to tolerate diuretics due to hypotension and syncope.   He has an abnormal electrocardiogram and nonobstructive coronary artery disease (angiography 2013 showed 40-50% mid RCA, 40% proximal LAD). Echo in May 2017 showed left ventricular ejection fraction of 60-65% with left ventricular hypertrophy and diastolic dysfunction.   On most recent office visit with Dr. Sallyanne [name], his amlodipine was reduced to 5 mg daily to allow for permissive hypertension.  He comes today without cardiac complaints. His main complaint is left leg pain and weakness, not allow him to walk or have strength in the left leg. He is now in a wheelchair. He is surgery date has not been determined.  Past Medical History:  Diagnosis Date  . Anemia   . Anxiety   . Arthritis, rheumatoid ([place])   . BPH (benign prostatic hyperplasia)   . CAD (coronary artery disease)   . CKD (chronic kidney disease)   . COPD (chronic obstructive pulmonary disease) ([place])   . Depression   . Diverticulosis   . Elevated PSA   . Gangrene ([place])    of toe right foot  . GERD (gastroesophageal reflux disease)   . Herpes simplex labialis   . Hiatal hernia   . High cholesterol   . History of colonic polyps   . Hypertension   . Insomnia   . Lacunar infarction  ([place])   . Memory loss   . Obesity   . Panic attacks   . Prostate cancer ([place])   . Renal cell cancer ([place])    s/p nephrectomy in 2000  . Seropositive rheumatoid arthritis ([place])   . Spermatocele   . Tinnitus   . Tremor of both hands     Past Surgical History:  Procedure Laterality Date  . AMPUTATION TOE Right 06/13/2017   Procedure: AMPUTATION TOE 1st and 2nd;  Surgeon: Derrick [name], DPM;  Location: [facility];  Service: Podiatry;  Laterality: Right;  . AMPUTATION TOE    . BACK SURGERY    . COLONOSCOPY    . GRAFT APPLICATION Right 3/32/9518   Procedure: EPIFIX GRAFT APPLICATION OF RIGHT FOOT;  Surgeon: Derrick [name], DPM;  Location: [facility];  Service: Podiatry;  Laterality: Right;  . I&D EXTREMITY Right 09/13/2017   Procedure: IRRIGATION AND DEBRIDEMENT AND BONE BIOPSY;  Surgeon: Derrick [name], DPM;  Location: [facility];  Service: Podiatry;  Laterality: Right;  . INCISION AND DRAINAGE OF WOUND Right 12/18/2017   Procedure: IRRIGATION AND DEBRIDEMENT WOUND ON RIGHT FOOT;  Surgeon: Derrick [name], DPM;  Location: [facility];  Service: Podiatry;  Laterality: Right;  . IR ANGIOGRAM EXTREMITY RIGHT  05/30/2017  . IR ANGIOGRAM EXTREMITY RIGHT  08/08/2017  . IR ANGIOGRAM SELECTIVE EACH ADDITIONAL VESSEL  08/08/2017  . IR ANGIOGRAM SELECTIVE EACH ADDITIONAL VESSEL  08/08/2017  . IR  RADIOLOGIST EVAL & MGMT  05/16/2017  . IR TIB-PERO ART PTA MOD SED  05/30/2017  . IR TIB-PERO ART PTA MOD SED  08/08/2017  . IR TIB-PERO ART UNI PTA EA ADD VESSEL MOD SED  05/30/2017  . IR TIB-PERO ART UNI PTA EA ADD VESSEL MOD SED  08/08/2017  . IR US GUIDE VASC ACCESS RIGHT  05/30/2017  . IR US GUIDE VASC ACCESS RIGHT  08/08/2017  . LEFT HEART CATHETERIZATION WITH CORONARY ANGIOGRAM N/A 11/19/2011   Procedure: LEFT HEART CATHETERIZATION WITH CORONARY ANGIOGRAM;  Surgeon: Derrick Man, MD;  Location: [facility] C[facility] LAB;  Service: Cardiovascular;  Laterality: N/A;  . NEPHRECTOMY  10/1998  . TRANSMETATARSAL AMPUTATION Right 08/14/2017     Procedure: TRANSMETATARSAL AMPUTATION;  Surgeon: Derrick [name], DPM;  Location: [facility];  Service: Podiatry;  Laterality: Right;     Current Outpatient Medications  Medication Sig Dispense Refill  . amLODipine (NORVASC) 5 MG tablet Take 1 tablet (5 mg total) by mouth daily. SCHEDULE OV FOR FURTHER REFILLS. 60 tablet 0  . aspirin EC 81 MG tablet Take 81 mg by mouth at bedtime.     . clopidogrel (PLAVIX) 75 MG tablet Take 75 mg by mouth daily.    Marland Kitchen docusate sodium (COLACE) 100 MG capsule Take 100 mg by mouth 2 (two) times daily.    . finasteride (PROSCAR) 5 MG tablet Take 5 mg by mouth daily.    Marland Kitchen gabapentin (NEURONTIN) 100 MG capsule Take 1 capsule (100 mg total) by mouth 3 (three) times daily. (Patient taking differently: Take 100 mg by mouth at bedtime. ) 60 capsule 0  . hydroxychloroquine (PLAQUENIL) 200 MG tablet Take 200 mg by mouth daily.    . Multiple Vitamin (MULTIVITAMIN WITH MINERALS) TABS tablet Take 1 tablet by mouth daily.    Marland Kitchen omega-3 acid ethyl esters (LOVAZA) 1 g capsule Take 2,000 mg by mouth daily after breakfast.    . oxyCODONE (ROXICODONE) 5 MG immediate release tablet Take 0.5-1 tablets (2.5-5 mg total) by mouth every 6 (six) hours as needed for moderate pain or severe pain (0.5 tablets for moderate pain, 1 tablet for severe pain). 15 tablet 0  . pantoprazole (PROTONIX) 40 MG tablet Take 40 mg by mouth daily.     . predniSONE (DELTASONE) 5 MG tablet Take 5 mg by mouth daily with breakfast.    . sertraline (ZOLOFT) 25 MG tablet Take 25 mg by mouth daily.    . traMADol (ULTRAM) 50 MG tablet Take 1 tablet (50 mg total) by mouth every 6 (six) hours as needed for moderate pain. (Patient taking differently: Take 50 mg by mouth daily as needed for moderate pain. Takes in the morning when needed.) 15 tablet 0  . traZODone (DESYREL) 50 MG tablet Take 75 mg by mouth at bedtime as needed for sleep.     No current facility-administered medications for this visit.     Allergies:    Patient has no known allergies.    Social History:  The patient  reports that he quit smoking about 40 years ago. His smoking use included cigarettes. He has a 29.00 pack-year smoking history. He has never used smokeless tobacco. He reports that he does not drink alcohol or use drugs.   Family History:  The patient's family history includes Breast cancer (age of onset: 55) in his daughter; COPD in his son; Cancer in his paternal aunt and sister; Heart attack (age of onset: 58) in his son; Heart attack (age of  onset: 61) in his father; Kidney disease in his sister; Kidney failure in his sister; Leukemia (age of onset: 67) in his son; Stroke in his father.    ROS: All other systems are reviewed and negative. Unless otherwise mentioned in H&P    PHYSICAL EXAM: VS:  BP 120/80   Pulse 80   Ht 6' (1.829 m)   Wt 179 lb 3.2 oz (81.3 kg)   BMI 24.30 kg/m  , BMI Body mass index is 24.3 kg/m. GEN: Well nourished, well developed, in no acute distress HEENT: normal Neck: no JVD, carotid bruits, or masses Cardiac: RRR; 2/6 systolic with preserved S-2, murmurs, rubs, or gallops,no edema  Respiratory:  Clear to auscultation bilaterally, normal work of breathing GI: soft, nontender, nondistended, + BS MS: no deformity or atrophy Skin: warm and dry, no rash Neuro:  Strength and sensation are intact Psych: euthymic mood, full affect   EKG:  Sinus rhythm with PAC's rate of 80 bpm Recent Labs: 06/11/2018: ALT 21 10/03/2018: BUN 20; Creatinine, Ser 1.10; Hemoglobin 12.6; Platelets 203; Potassium 4.2; Sodium 133    Lipid Panel    Component Value Date/Time   CHOL 171 10/04/2017 1201   TRIG 95 10/04/2017 1201   HDL 61 10/04/2017 1201   CHOLHDL 2.8 10/04/2017 1201   CHOLHDL 2.3 09/15/2012 0406   VLDL 19 09/15/2012 0406   LDLCALC 91 10/04/2017 1201      Wt Readings from Last 3 Encounters:  11/04/18 179 lb 3.2 oz (81.3 kg)  10/05/18 169 lb (76.7 kg)  06/11/18 180 lb (81.6 kg)    Other  studies Reviewed: Echocardiogram 09-25-2017 Left ventricle: The cavity size was normal. Wall thickness was   normal. Systolic function was normal. The estimated ejection   fraction was in the range of 55% to 60%. Wall motion was normal;   there were no regional wall motion abnormalities. Doppler   parameters are consistent with abnormal left ventricular   relaxation (grade 1 diastolic dysfunction). - Aortic valve: Trileaflet; mildly thickened, mildly calcified   leaflets. - Mitral valve: There was mild regurgitation.  Echocardiogram 03/22/2016  The left ventricular ejection fraction is normal (55-65%).  Nuclear stress EF: 61%.  There was no ST segment deviation noted during stress.  No T wave inversion was noted during stress.  The study is normal.  This is a low risk study.   ASSESSMENT AND PLAN:  1. Pre-Operative Cardiology Clearance:  Chart reviewed as part of pre-operative protocol coverage. Given past medical history and time since last visit, based on ACC/AHA guidelines, CHEVIS WEISENSEL would be at acceptable risk for the planned procedure without further cardiovascular testing. He is to stop ASA and Plavix 5 days prior to his surgical procedure and begin again ASAP there after.   2. CAD: Hx of cath in 2013 with non-obstructive disease as above. Last stress test was low risk in 2017. Continue secondary prevention.  3. Hypertension: BP is currently well controlled. Continue amlodipine    Current medicines are reviewed at length with the patient today.    Labs/ tests ordered today include: None Phill Myron. West Pugh, ANP, [facility]   11/04/2018 8:58 AM    [facility] [place] [address] Office [phone] Fax [phone]

## 2018-11-04 ENCOUNTER — Encounter: Payer: Self-pay | Admitting: Adult Health

## 2018-11-04 ENCOUNTER — Ambulatory Visit: Payer: Medicare Other | Admitting: Adult Health

## 2018-11-04 VITALS — BP 120/80 | HR 80 | Ht 72.0 in | Wt 179.2 lb

## 2018-11-04 DIAGNOSIS — I2510 Atherosclerotic heart disease of native coronary artery without angina pectoris: Secondary | ICD-10-CM

## 2018-11-04 DIAGNOSIS — Z01810 Encounter for preprocedural cardiovascular examination: Secondary | ICD-10-CM | POA: Diagnosis not present

## 2018-11-04 DIAGNOSIS — I10 Essential (primary) hypertension: Secondary | ICD-10-CM

## 2018-11-04 NOTE — Patient Instructions (Signed)
CLEARED FOR -Lumbar Spinal Decompression [facility]  Dr.Gioffre. OK TO HOLD ASPIRIN AND PLAVIX 5 DAYS PRIOR  Follow-Up: You will need a follow up appointment in 6 months.  Please call our office 2 months in advance  (June 2020) to schedule this  (AUGUST 2020) appointment.  You may see Sanda Klein, MD or one of the following Advanced Practice Providers on your designated Care Team:  Almyra Deforest, PA-C  Fabian Sharp, [place]       Medication Instructions:  NO CHANGES- Your physician recommends that you continue on your current medications as directed. Please refer to the Current Medication list given to you today. If you need a refill on your cardiac medications before your next appointment, please call your pharmacy. Labwork: When you have labs (blood work) and your tests are completely normal, you will receive your results ONLY by [place] (if you have MyChart) -OR- A paper copy in the mail.  At [facility], you and your health needs are our priority.  As part of our continuing mission to provide you with exceptional heart care, we have created designated Provider Care Teams.  These Care Teams include your primary Cardiologist (physician) and Advanced Practice Providers (APPs -  Physician Assistants and Nurse Practitioners) who all work together to provide you with the care you need, when you need it.  Thank you for choosing [facility]!!

## 2018-11-04 NOTE — Progress Notes (Signed)
Thank you, Agree MCr

## 2018-11-06 DIAGNOSIS — E7800 Pure hypercholesterolemia, unspecified: Secondary | ICD-10-CM | POA: Diagnosis not present

## 2018-11-06 DIAGNOSIS — K5900 Constipation, unspecified: Secondary | ICD-10-CM | POA: Diagnosis not present

## 2018-11-06 DIAGNOSIS — Z85528 Personal history of other malignant neoplasm of kidney: Secondary | ICD-10-CM | POA: Diagnosis not present

## 2018-11-06 DIAGNOSIS — I2510 Atherosclerotic heart disease of native coronary artery without angina pectoris: Secondary | ICD-10-CM | POA: Diagnosis not present

## 2018-11-06 DIAGNOSIS — Z79899 Other long term (current) drug therapy: Secondary | ICD-10-CM | POA: Diagnosis not present

## 2018-11-19 DIAGNOSIS — N179 Acute kidney failure, unspecified: Secondary | ICD-10-CM | POA: Diagnosis not present

## 2018-11-19 DIAGNOSIS — D631 Anemia in chronic kidney disease: Secondary | ICD-10-CM | POA: Diagnosis not present

## 2018-11-19 DIAGNOSIS — I129 Hypertensive chronic kidney disease with stage 1 through stage 4 chronic kidney disease, or unspecified chronic kidney disease: Secondary | ICD-10-CM | POA: Diagnosis not present

## 2018-11-19 DIAGNOSIS — R809 Proteinuria, unspecified: Secondary | ICD-10-CM | POA: Diagnosis not present

## 2018-11-19 DIAGNOSIS — N2581 Secondary hyperparathyroidism of renal origin: Secondary | ICD-10-CM | POA: Diagnosis not present

## 2018-11-19 DIAGNOSIS — N189 Chronic kidney disease, unspecified: Secondary | ICD-10-CM | POA: Diagnosis not present

## 2018-11-19 DIAGNOSIS — N183 Chronic kidney disease, stage 3 (moderate): Secondary | ICD-10-CM | POA: Diagnosis not present

## 2018-11-20 ENCOUNTER — Other Ambulatory Visit (HOSPITAL_COMMUNITY): Payer: Self-pay | Admitting: *Deleted

## 2018-11-20 NOTE — Progress Notes (Signed)
Cardiac clearance Derrick [name] 11-04-18 epic Echo 09-20-17 epic ekg 11-04-18 epic

## 2018-11-20 NOTE — Patient Instructions (Addendum)
Derrick [name]  11/20/2018   Your procedure is scheduled on: 2--19-2020  Report to [facility] Main  Entrance  Report to admitting at 630 AM    Call this number if you have problems the morning of surgery [phone]   Remember: Do not eat food or drink liquids :After Midnight. BRUSH YOUR TEETH MORNING OF SURGERY AND RINSE YOUR MOUTH OUT, NO CHEWING GUM CANDY OR MINTS.     Take these medicines the morning of surgery with A SIP OF WATER: oxycodone if needed, sertraline (zoloft), prednisone, AMLODIPINE,               You may not have any metal on your body including hair pins and              piercings  Do not wear jewelry, make-up, lotions, powders or perfumes, deodorant             Do not wear nail polish.  Do not shave  48 hours prior to surgery.              Men may shave face and neck.   Do not bring valuables to the hospital. [place].  Contacts, dentures or bridgework may not be worn into surgery.  Leave suitcase in the car. After surgery it may be brought to your room.                  _____________________________________________________________________             [facility] - Preparing for Surgery Before surgery, you can play an important role.  Because skin is not sterile, your skin needs to be as free of germs as possible.  You can reduce the number of germs on your skin by washing with CHG (chlorahexidine gluconate) soap before surgery.  CHG is an antiseptic cleaner which kills germs and bonds with the skin to continue killing germs even after washing. Please DO NOT use if you have an allergy to CHG or antibacterial soaps.  If your skin becomes reddened/irritated stop using the CHG and inform your nurse when you arrive at Short Stay. Do not shave (including legs and underarms) for at least 48 hours prior to the first CHG shower.  You may shave your face/neck. Please follow these instructions  carefully:  1.  Shower with CHG Soap the night before surgery and the  morning of Surgery.  2.  If you choose to wash your hair, wash your hair first as usual with your  normal  shampoo.  3.  After you shampoo, rinse your hair and body thoroughly to remove the  shampoo.                           4.  Use CHG as you would any other liquid soap.  You can apply chg directly  to the skin and wash                       Gently with a scrungie or clean washcloth.  5.  Apply the CHG Soap to your body ONLY FROM THE NECK DOWN.   Do not use on face/ open  Wound or open sores. Avoid contact with eyes, ears mouth and genitals (private parts).                       Wash face,  Genitals (private parts) with your normal soap.             6.  Wash thoroughly, paying special attention to the area where your surgery  will be performed.  7.  Thoroughly rinse your body with warm water from the neck down.  8.  DO NOT shower/wash with your normal soap after using and rinsing off  the CHG Soap.                9.  Pat yourself dry with a clean towel.            10.  Wear clean pajamas.            11.  Place clean sheets on your bed the night of your first shower and do not  sleep with pets. Day of Surgery : Do not apply any lotions/deodorants the morning of surgery.  Please wear clean clothes to the hospital/surgery center.  FAILURE TO FOLLOW THESE INSTRUCTIONS MAY RESULT IN THE CANCELLATION OF YOUR SURGERY PATIENT SIGNATURE_________________________________  NURSE SIGNATURE__________________________________  ________________________________________________________________________   [name]  An incentive spirometer is a tool that can help keep your lungs clear and active. This tool measures how well you are filling your lungs with each breath. Taking long deep breaths may help reverse or decrease the chance of developing breathing (pulmonary) problems (especially infection)  following:  A long period of time when you are unable to move or be active. BEFORE THE PROCEDURE   If the spirometer includes an indicator to show your best effort, your nurse or respiratory therapist will set it to a desired goal.  If possible, sit up straight or lean slightly forward. Try not to slouch.  Hold the incentive spirometer in an upright position. INSTRUCTIONS FOR USE  1. Sit on the edge of your bed if possible, or sit up as far as you can in bed or on a chair. 2. Hold the incentive spirometer in an upright position. 3. Breathe out normally. 4. Place the mouthpiece in your mouth and seal your lips tightly around it. 5. Breathe in slowly and as deeply as possible, raising the piston or the ball toward the top of the column. 6. Hold your breath for 3-5 seconds or for as long as possible. Allow the piston or ball to fall to the bottom of the column. 7. Remove the mouthpiece from your mouth and breathe out normally. 8. Rest for a few seconds and repeat Steps 1 through 7 at least 10 times every 1-2 hours when you are awake. Take your time and take a few normal breaths between deep breaths. 9. The spirometer may include an indicator to show your best effort. Use the indicator as a goal to work toward during each repetition. 10. After each set of 10 deep breaths, practice coughing to be sure your lungs are clear. If you have an incision (the cut made at the time of surgery), support your incision when coughing by placing a pillow or rolled up towels firmly against it. Once you are able to get out of bed, walk around indoors and cough well. You may stop using the incentive spirometer when instructed by your caregiver.  RISKS AND COMPLICATIONS  Take your time so you do not get  dizzy or light-headed.  If you are in pain, you may need to take or ask for pain medication before doing incentive spirometry. It is harder to take a deep breath if you are having pain. AFTER USE  Rest and  breathe slowly and easily.  It can be helpful to keep track of a log of your progress. Your caregiver can provide you with a simple table to help with this. If you are using the spirometer at home, follow these instructions: [place] IF:   You are having difficultly using the spirometer.  You have trouble using the spirometer as often as instructed.  Your pain medication is not giving enough relief while using the spirometer.  You develop fever of 100.5 F (38.1 C) or higher. SEEK IMMEDIATE MEDICAL CARE IF:   You cough up bloody sputum that had not been present before.  You develop fever of 102 F (38.9 C) or greater.  You develop worsening pain at or near the incision site. MAKE SURE YOU:   Understand these instructions.  Will watch your condition.  Will get help right away if you are not doing well or get worse. Document Released: 02/04/2007 Document Revised: 12/17/2011 Document Reviewed: 04/07/2007 ExitCare Patient Information 2014 ExitCare, [place].   ________________________________________________________________________  WHAT IS A BLOOD TRANSFUSION? Blood Transfusion Information  A transfusion is the replacement of blood or some of its parts. Blood is made up of multiple cells which provide different functions.  Red blood cells carry oxygen and are used for blood loss replacement.  White blood cells fight against infection.  Platelets control bleeding.  Plasma helps clot blood.  Other blood products are available for specialized needs, such as hemophilia or other clotting disorders. BEFORE THE TRANSFUSION  Who gives blood for transfusions?   Healthy volunteers who are fully evaluated to make sure their blood is safe. This is blood bank blood. Transfusion therapy is the safest it has ever been in the practice of medicine. Before blood is taken from a donor, a complete history is taken to make sure that person has no history of diseases nor engages in  risky social behavior (examples are intravenous drug use or sexual activity with multiple partners). The donor's travel history is screened to minimize risk of transmitting infections, such as malaria. The donated blood is tested for signs of infectious diseases, such as HIV and hepatitis. The blood is then tested to be sure it is compatible with you in order to minimize the chance of a transfusion reaction. If you or a relative donates blood, this is often done in anticipation of surgery and is not appropriate for emergency situations. It takes many days to process the donated blood. RISKS AND COMPLICATIONS Although transfusion therapy is very safe and saves many lives, the main dangers of transfusion include:   Getting an infectious disease.  Developing a transfusion reaction. This is an allergic reaction to something in the blood you were given. Every precaution is taken to prevent this. The decision to have a blood transfusion has been considered carefully by your caregiver before blood is given. Blood is not given unless the benefits outweigh the risks. AFTER THE TRANSFUSION  Right after receiving a blood transfusion, you will usually feel much better and more energetic. This is especially true if your red blood cells have gotten low (anemic). The transfusion raises the level of the red blood cells which carry oxygen, and this usually causes an energy increase.  The nurse administering the transfusion will  monitor you carefully for complications. HOME CARE INSTRUCTIONS  No special instructions are needed after a transfusion. You may find your energy is better. Speak with your caregiver about any limitations on activity for underlying diseases you may have. SEEK MEDICAL CARE IF:   Your condition is not improving after your transfusion.  You develop redness or irritation at the intravenous (IV) site. SEEK IMMEDIATE MEDICAL CARE IF:  Any of the following symptoms occur over the next 12  hours:  Shaking chills.  You have a temperature by mouth above 102 F (38.9 C), not controlled by medicine.  Chest, back, or muscle pain.  People around you feel you are not acting correctly or are confused.  Shortness of breath or difficulty breathing.  Dizziness and fainting.  You get a rash or develop hives.  You have a decrease in urine output.  Your urine turns a dark color or changes to pink, red, or brown. Any of the following symptoms occur over the next 10 days:  You have a temperature by mouth above 102 F (38.9 C), not controlled by medicine.  Shortness of breath.  Weakness after normal activity.  The white part of the eye turns yellow (jaundice).  You have a decrease in the amount of urine or are urinating less often.  Your urine turns a dark color or changes to pink, red, or brown. Document Released: 09/21/2000 Document Revised: 12/17/2011 Document Reviewed: 05/10/2008 [facility] Patient Information 2014 [place], [place].  _______________________________________________________________________

## 2018-11-24 ENCOUNTER — Encounter (HOSPITAL_COMMUNITY)
Admission: RE | Admit: 2018-11-24 | Discharge: 2018-11-24 | Disposition: A | Discharge status: Home / Self Care | Payer: Medicare Other | Source: Ambulatory Visit | Attending: Orthopedic Surgery | Admitting: Orthopedic Surgery

## 2018-11-24 ENCOUNTER — Ambulatory Visit (HOSPITAL_COMMUNITY)
Admission: RE | Admit: 2018-11-24 | Discharge: 2018-11-24 | Disposition: A | Discharge status: Home / Self Care | Payer: Medicare Other | Source: Ambulatory Visit | Attending: Surgical | Admitting: Surgical

## 2018-11-24 ENCOUNTER — Other Ambulatory Visit: Payer: Self-pay

## 2018-11-24 ENCOUNTER — Encounter (HOSPITAL_COMMUNITY): Payer: Self-pay

## 2018-11-24 DIAGNOSIS — M545 Low back pain, unspecified: Secondary | ICD-10-CM

## 2018-11-24 DIAGNOSIS — Z01818 Encounter for other preprocedural examination: Secondary | ICD-10-CM | POA: Diagnosis not present

## 2018-11-24 DIAGNOSIS — M47816 Spondylosis without myelopathy or radiculopathy, lumbar region: Secondary | ICD-10-CM | POA: Diagnosis not present

## 2018-11-24 DIAGNOSIS — M4807 Spinal stenosis, lumbosacral region: Secondary | ICD-10-CM | POA: Diagnosis not present

## 2018-11-24 LAB — CBC WITH DIFFERENTIAL/PLATELET
Abs Immature Granulocytes: 0.04 10*3/uL (ref 0.00–0.07)
Basophils Absolute: 0 10*3/uL (ref 0.0–0.1)
Basophils Relative: 1 %
Eosinophils Absolute: 0.2 10*3/uL (ref 0.0–0.5)
Eosinophils Relative: 3 %
HCT: 40.5 % (ref 39.0–52.0)
Hemoglobin: 12.9 g/dL — ABNORMAL LOW (ref 13.0–17.0)
Immature Granulocytes: 1 %
Lymphocytes Relative: 26 %
Lymphs Abs: 2.1 10*3/uL (ref 0.7–4.0)
MCH: 28.8 pg (ref 26.0–34.0)
MCHC: 31.9 g/dL (ref 30.0–36.0)
MCV: 90.4 fL (ref 80.0–100.0)
Monocytes Absolute: 0.8 10*3/uL (ref 0.1–1.0)
Monocytes Relative: 10 %
Neutro Abs: 5 10*3/uL (ref 1.7–7.7)
Neutrophils Relative %: 59 %
Platelets: 199 10*3/uL (ref 150–400)
RBC: 4.48 MIL/uL (ref 4.22–5.81)
RDW: 15.9 % — ABNORMAL HIGH (ref 11.5–15.5)
WBC: 8.2 10*3/uL (ref 4.0–10.5)
nRBC: 0 % (ref 0.0–0.2)

## 2018-11-24 LAB — COMPREHENSIVE METABOLIC PANEL
ALT: 24 U/L (ref 0–44)
AST: 24 U/L (ref 15–41)
Albumin: 4.5 g/dL (ref 3.5–5.0)
Alkaline Phosphatase: 42 U/L (ref 38–126)
Anion gap: 9 (ref 5–15)
BUN: 16 mg/dL (ref 8–23)
CO2: 23 mmol/L (ref 22–32)
Calcium: 9.6 mg/dL (ref 8.9–10.3)
Chloride: 106 mmol/L (ref 98–111)
Creatinine, Ser: 1.3 mg/dL — ABNORMAL HIGH (ref 0.61–1.24)
GFR calc Af Amer: 58 mL/min — ABNORMAL LOW (ref >60)
GFR calc non Af Amer: 50 mL/min — ABNORMAL LOW (ref >60)
Glucose, Bld: 116 mg/dL — ABNORMAL HIGH (ref 70–99)
Potassium: 4.4 mmol/L (ref 3.5–5.1)
Sodium: 138 mmol/L (ref 135–145)
Total Bilirubin: 1.1 mg/dL (ref 0.3–1.2)
Total Protein: 6.9 g/dL (ref 6.5–8.1)

## 2018-11-24 LAB — ABO/RH: ABO/RH(D): O POS

## 2018-11-24 LAB — SURGICAL PCR SCREEN
MRSA, PCR: NEGATIVE
Staphylococcus aureus: POSITIVE — AB

## 2018-11-24 LAB — PROTIME-INR
INR: 0.96
Prothrombin Time: 12.7 seconds (ref 11.4–15.2)

## 2018-11-25 ENCOUNTER — Telehealth: Payer: Self-pay | Admitting: Adult Health

## 2018-11-25 NOTE — Telephone Encounter (Signed)
New Message   Provider that referred patient to [place] would like appointment notes faxed to them.

## 2018-11-25 NOTE — Anesthesia Preprocedure Evaluation (Addendum)
Anesthesia Evaluation  Patient identified by MRN, date of birth, ID band Patient awake    Reviewed: Allergy & Precautions, NPO status , Patient's Chart, lab work & pertinent test results  Airway Mallampati: II  TM Distance: >3 FB Neck ROM: Full    Dental no notable dental hx.    Pulmonary COPD, former smoker,    Pulmonary exam normal breath sounds clear to auscultation       Cardiovascular hypertension, Normal cardiovascular exam Rhythm:Regular Rate:Normal     Neuro/Psych Dementia negative neurological ROS     GI/Hepatic Neg liver ROS, GERD  ,  Endo/Other  negative endocrine ROS  Renal/GU negative Renal ROS  negative genitourinary   Musculoskeletal  (+) Arthritis , Rheumatoid disorders and steroids,    Abdominal   Peds negative pediatric ROS (+)  Hematology negative hematology ROS (+)   Anesthesia Other Findings   Reproductive/Obstetrics negative OB ROS                           Anesthesia Physical Anesthesia Plan  ASA: III  Anesthesia Plan: General   Post-op Pain Management:    Induction: Intravenous  PONV Risk Score and Plan: 2 and Ondansetron, Dexamethasone and Treatment may vary due to age or medical condition  Airway Management Planned: Oral ETT  Additional Equipment:   Intra-op Plan:   Post-operative Plan: Extubation in OR  Informed Consent: I have reviewed the patients History and Physical, chart, labs and discussed the procedure including the risks, benefits and alternatives for the proposed anesthesia with the patient or authorized representative who has indicated his/her understanding and acceptance.     Dental advisory given  Plan Discussed with: CRNA and Surgeon  Anesthesia Plan Comments: (See PST note 11/24/18, Konrad Felix, PA-C)       Anesthesia Quick Evaluation

## 2018-11-25 NOTE — Progress Notes (Signed)
Spoke with Jenny Reichmann by phone aware surgery time change arrive 800 am 11-17-2018 [facility] admitting

## 2018-11-25 NOTE — Progress Notes (Signed)
Anesthesia Chart Review   Case:  [id number] Date/Time:  11/26/18 0943   Procedure:  Lumbar central decompression L2-3, L3-4 (N/A ) - 140min   Anesthesia type:  General   Pre-op diagnosis:  Lumbar stenosis L2-3, L3-4   Location:  [facility] ROOM 08 / [facility]   Surgeon:  [name], MD      DISCUSSION: 85 yo former smoker (29 pack years, quit 10/08/78) with h/o HTN, GERD, depression, anxiety, CAD, insomnia, anemia, BPH, prostate cancer, renal insufficiency, lumbar stenosis L2-3, L3-4 scheduled for above procedure 11/26/18 with Dr. [name].   Pt last seen by cardiology 11/04/18.  Seen by Jory Sims, NP.  At this visit pt stable.  Per note, "Given past medical history and time since last visit, based on ACC/AHA guidelines, Derrick [name] would be at acceptable risk for the planned procedure without further cardiovascular testing. He is to stop ASA and Plavix 5 days prior to his surgical procedure and begin again ASAP there after."  Pt can proceed with planned procedure barring acute status change.  VS: BP 119/82   Pulse 88   Temp 36.8 C (Oral)   Ht 6' (1.829 m)   Wt 78 kg   SpO2 99%   BMI 23.33 kg/m   PROVIDERS: [name], MD is PCP  Croitoru, MD is Cardiologist  LABS: Labs reviewed: Acceptable for surgery. (all labs ordered are listed, but only abnormal results are displayed)  Labs Reviewed  SURGICAL PCR SCREEN - Abnormal; Notable for the following components:      Result Value   Staphylococcus aureus POSITIVE (*)    All other components within normal limits  CBC WITH DIFFERENTIAL/PLATELET - Abnormal; Notable for the following components:   Hemoglobin 12.9 (*)    RDW 15.9 (*)    All other components within normal limits  COMPREHENSIVE METABOLIC PANEL - Abnormal; Notable for the following components:   Glucose, Bld 116 (*)    Creatinine, Ser 1.30 (*)    GFR calc non Af Amer 50 (*)    GFR calc Af Amer 58 (*)    All other components within normal limits  PROTIME-INR   TYPE AND SCREEN  ABO/RH     IMAGES: Carotid Doppler 09/15/12 Summary: Right: mild calcfic plaque origin and proximal ICA and ECA with acoustic shadowing. Left: minimal soft plaque noted. Bilateral: no ICA stenosis. Vertebral artery flow is antegrade.  EKG: 11/04/2018 Rate 80 bp60m Sinus rhythm with blocked premature atrial complexes with premature supraventricular complexes Nonspecific T wave abnormality Abnormal ECG   CV: Echo 09/20/2017 Study Conclusions  - Left ventricle: The cavity size was normal. Wall thickness was   normal. Systolic function was normal. The estimated ejection   fraction was in the range of 55% to 60%. Wall motion was normal;   there were no regional wall motion abnormalities. Doppler   parameters are consistent with abnormal left ventricular   relaxation (grade 1 diastolic dysfunction). - Aortic valve: Trileaflet; mildly thickened, mildly calcified   leaflets. - Mitral valve: There was mild regurgitation.  Stress Test 03/22/2016  The left ventricular ejection fraction is normal (55-65%).  Nuclear stress EF: 61%.  There was no ST segment deviation noted during stress.  No T wave inversion was noted during stress.  The study is normal.  This is a low risk study.    Past Medical History:  Diagnosis Date  . Anemia   . Anxiety   . Arthritis, rheumatoid ([place])   . BPH (benign prostatic hyperplasia)   .  CAD (coronary artery disease)   . CKD (chronic kidney disease)   . Depression   . Diverticulosis   . Elevated PSA   . Gangrene ([place])    of toe right foot  . GERD (gastroesophageal reflux disease)   . Herpes simplex labialis   . High cholesterol   . History of colonic polyps   . Hypertension   . Insomnia   . Lacunar infarction ([place])   . Memory loss   . Obesity   . Panic attacks   . Prostate cancer ([place]) YRS AGO  . Renal cell cancer ([place])    s/p nephrectomy in 2000  . Seropositive rheumatoid arthritis ([place])   . Spermatocele    . Tinnitus   . Tremor of both hands     Past Surgical History:  Procedure Laterality Date  . AMPUTATION TOE Right 06/13/2017   Procedure: AMPUTATION TOE 1st and 2nd;  Surgeon: [name], DPM;  Location: [place];  Service: Podiatry;  Laterality: Right;  . AMPUTATION TOE     RIGHT FOOT ALL TOES GONE  . BACK SURGERY  YRS AGO   LOWER  . COLONOSCOPY    . GRAFT APPLICATION Right 9/89/2119   Procedure: EPIFIX GRAFT APPLICATION OF RIGHT FOOT;  Surgeon: [name], DPM;  Location: [place];  Service: Podiatry;  Laterality: Right;  . I&D EXTREMITY Right 09/13/2017   Procedure: IRRIGATION AND DEBRIDEMENT AND BONE BIOPSY;  Surgeon: [name], DPM;  Location: [place];  Service: Podiatry;  Laterality: Right;  . INCISION AND DRAINAGE OF WOUND Right 12/18/2017   Procedure: IRRIGATION AND DEBRIDEMENT WOUND ON RIGHT FOOT;  Surgeon: [name], DPM;  Location: [place];  Service: Podiatry;  Laterality: Right;  . IR ANGIOGRAM EXTREMITY RIGHT  05/30/2017  . IR ANGIOGRAM EXTREMITY RIGHT  08/08/2017  . IR ANGIOGRAM SELECTIVE EACH ADDITIONAL VESSEL  08/08/2017  . IR ANGIOGRAM SELECTIVE EACH ADDITIONAL VESSEL  08/08/2017  . IR RADIOLOGIST EVAL & MGMT  05/16/2017  . IR TIB-PERO ART PTA MOD SED  05/30/2017  . IR TIB-PERO ART PTA MOD SED  08/08/2017  . IR TIB-PERO ART UNI PTA EA ADD VESSEL MOD SED  05/30/2017  . IR TIB-PERO ART UNI PTA EA ADD VESSEL MOD SED  08/08/2017  . IR US GUIDE VASC ACCESS RIGHT  05/30/2017  . IR US GUIDE VASC ACCESS RIGHT  08/08/2017  . LEFT HEART CATHETERIZATION WITH CORONARY ANGIOGRAM N/A 11/19/2011   Procedure: LEFT HEART CATHETERIZATION WITH CORONARY ANGIOGRAM;  Surgeon: [name], MD;  Location: [facility] CATH LAB;  Service: Cardiovascular;  Laterality: N/A;  . NEPHRECTOMY  10/1998  . TRANSMETATARSAL AMPUTATION Right 08/14/2017   Procedure: TRANSMETATARSAL AMPUTATION;  Surgeon: [name], DPM;  Location: [place];  Service: Podiatry;  Laterality: Right;    MEDICATIONS: .  acetaminophen (TYLENOL) 500 MG tablet  . amLODipine (NORVASC) 5 MG tablet  . aspirin EC 81 MG tablet  . clopidogrel (PLAVIX) 75 MG tablet  . docusate sodium (COLACE) 100 MG capsule  . finasteride (PROSCAR) 5 MG tablet  . gabapentin (NEURONTIN) 100 MG capsule  . hydroxychloroquine (PLAQUENIL) 200 MG tablet  . Multiple Vitamin (MULTIVITAMIN WITH MINERALS) TABS tablet  . Omega-3-6-9 CAPS  . oxyCODONE (ROXICODONE) 5 MG immediate release tablet  . oxyCODONE-acetaminophen (PERCOCET/ROXICET) 5-325 MG tablet  . pantoprazole (PROTONIX) 40 MG tablet  . predniSONE (DELTASONE) 5 MG tablet  . sertraline (ZOLOFT) 25 MG tablet  . traZODone (DESYREL) 50 MG tablet   No current  facility-administered medications for this encounter.      Maia Plan [facility] Pre-Surgical Testing [phone] 11/25/18 10:29 AM

## 2018-11-26 ENCOUNTER — Encounter (HOSPITAL_COMMUNITY)
Admission: RE | Disposition: A | Discharge status: Skilled Nursing Facility | Payer: Self-pay | Source: Ambulatory Visit | Attending: Orthopedic Surgery

## 2018-11-26 ENCOUNTER — Encounter (HOSPITAL_COMMUNITY): Payer: Self-pay | Admitting: General Practice

## 2018-11-26 ENCOUNTER — Ambulatory Visit (HOSPITAL_COMMUNITY): Payer: Medicare Other

## 2018-11-26 ENCOUNTER — Other Ambulatory Visit: Payer: Self-pay

## 2018-11-26 ENCOUNTER — Ambulatory Visit (HOSPITAL_COMMUNITY): Payer: Medicare Other | Admitting: Physician Assistant

## 2018-11-26 ENCOUNTER — Ambulatory Visit (HOSPITAL_COMMUNITY): Payer: Medicare Other | Admitting: Registered Nurse

## 2018-11-26 ENCOUNTER — Inpatient Hospital Stay (HOSPITAL_COMMUNITY)
Admission: RE | Admit: 2018-11-26 | Discharge: 2018-12-03 | DRG: 518 | Disposition: A | Discharge status: Skilled Nursing Facility | Payer: Medicare Other | Source: Ambulatory Visit | Attending: Orthopedic Surgery | Admitting: Orthopedic Surgery

## 2018-11-26 DIAGNOSIS — Z86010 Personal history of colonic polyps: Secondary | ICD-10-CM

## 2018-11-26 DIAGNOSIS — N183 Chronic kidney disease, stage 3 (moderate): Secondary | ICD-10-CM | POA: Diagnosis not present

## 2018-11-26 DIAGNOSIS — R000 Tachycardia, unspecified: Secondary | ICD-10-CM

## 2018-11-26 DIAGNOSIS — I129 Hypertensive chronic kidney disease with stage 1 through stage 4 chronic kidney disease, or unspecified chronic kidney disease: Secondary | ICD-10-CM | POA: Diagnosis not present

## 2018-11-26 DIAGNOSIS — D696 Thrombocytopenia, unspecified: Secondary | ICD-10-CM | POA: Diagnosis present

## 2018-11-26 DIAGNOSIS — R310 Gross hematuria: Secondary | ICD-10-CM

## 2018-11-26 DIAGNOSIS — I10 Essential (primary) hypertension: Secondary | ICD-10-CM | POA: Diagnosis not present

## 2018-11-26 DIAGNOSIS — F419 Anxiety disorder, unspecified: Secondary | ICD-10-CM | POA: Diagnosis present

## 2018-11-26 DIAGNOSIS — M9983 Other biomechanical lesions of lumbar region: Secondary | ICD-10-CM | POA: Diagnosis not present

## 2018-11-26 DIAGNOSIS — M059 Rheumatoid arthritis with rheumatoid factor, unspecified: Secondary | ICD-10-CM

## 2018-11-26 DIAGNOSIS — Z8546 Personal history of malignant neoplasm of prostate: Secondary | ICD-10-CM

## 2018-11-26 DIAGNOSIS — M21372 Foot drop, left foot: Secondary | ICD-10-CM | POA: Diagnosis not present

## 2018-11-26 DIAGNOSIS — Z905 Acquired absence of kidney: Secondary | ICD-10-CM

## 2018-11-26 DIAGNOSIS — G9341 Metabolic encephalopathy: Secondary | ICD-10-CM

## 2018-11-26 DIAGNOSIS — Z79899 Other long term (current) drug therapy: Secondary | ICD-10-CM

## 2018-11-26 DIAGNOSIS — Z89421 Acquired absence of other right toe(s): Secondary | ICD-10-CM

## 2018-11-26 DIAGNOSIS — Z8673 Personal history of transient ischemic attack (TIA), and cerebral infarction without residual deficits: Secondary | ICD-10-CM | POA: Diagnosis not present

## 2018-11-26 DIAGNOSIS — J449 Chronic obstructive pulmonary disease, unspecified: Secondary | ICD-10-CM | POA: Diagnosis present

## 2018-11-26 DIAGNOSIS — I498 Other specified cardiac arrhythmias: Secondary | ICD-10-CM

## 2018-11-26 DIAGNOSIS — I491 Atrial premature depolarization: Secondary | ICD-10-CM | POA: Diagnosis not present

## 2018-11-26 DIAGNOSIS — D6959 Other secondary thrombocytopenia: Secondary | ICD-10-CM | POA: Diagnosis not present

## 2018-11-26 DIAGNOSIS — Z87891 Personal history of nicotine dependence: Secondary | ICD-10-CM | POA: Diagnosis not present

## 2018-11-26 DIAGNOSIS — M48062 Spinal stenosis, lumbar region with neurogenic claudication: Secondary | ICD-10-CM | POA: Diagnosis not present

## 2018-11-26 DIAGNOSIS — G4700 Insomnia, unspecified: Secondary | ICD-10-CM | POA: Diagnosis not present

## 2018-11-26 DIAGNOSIS — N400 Enlarged prostate without lower urinary tract symptoms: Secondary | ICD-10-CM | POA: Diagnosis present

## 2018-11-26 DIAGNOSIS — K219 Gastro-esophageal reflux disease without esophagitis: Secondary | ICD-10-CM | POA: Diagnosis not present

## 2018-11-26 DIAGNOSIS — M48061 Spinal stenosis, lumbar region without neurogenic claudication: Secondary | ICD-10-CM | POA: Diagnosis not present

## 2018-11-26 DIAGNOSIS — Z781 Physical restraint status: Secondary | ICD-10-CM

## 2018-11-26 DIAGNOSIS — F05 Delirium due to known physiological condition: Secondary | ICD-10-CM | POA: Diagnosis not present

## 2018-11-26 DIAGNOSIS — Z419 Encounter for procedure for purposes other than remedying health state, unspecified: Secondary | ICD-10-CM

## 2018-11-26 DIAGNOSIS — Z981 Arthrodesis status: Secondary | ICD-10-CM | POA: Diagnosis not present

## 2018-11-26 DIAGNOSIS — D62 Acute posthemorrhagic anemia: Secondary | ICD-10-CM

## 2018-11-26 DIAGNOSIS — Z85528 Personal history of other malignant neoplasm of kidney: Secondary | ICD-10-CM | POA: Diagnosis not present

## 2018-11-26 DIAGNOSIS — R32 Unspecified urinary incontinence: Secondary | ICD-10-CM | POA: Diagnosis not present

## 2018-11-26 DIAGNOSIS — Z7902 Long term (current) use of antithrombotics/antiplatelets: Secondary | ICD-10-CM

## 2018-11-26 DIAGNOSIS — M5136 Other intervertebral disc degeneration, lumbar region: Secondary | ICD-10-CM | POA: Diagnosis not present

## 2018-11-26 DIAGNOSIS — I2510 Atherosclerotic heart disease of native coronary artery without angina pectoris: Secondary | ICD-10-CM

## 2018-11-26 DIAGNOSIS — Z8249 Family history of ischemic heart disease and other diseases of the circulatory system: Secondary | ICD-10-CM

## 2018-11-26 DIAGNOSIS — F329 Major depressive disorder, single episode, unspecified: Secondary | ICD-10-CM | POA: Diagnosis present

## 2018-11-26 DIAGNOSIS — Z841 Family history of disorders of kidney and ureter: Secondary | ICD-10-CM

## 2018-11-26 DIAGNOSIS — Z7982 Long term (current) use of aspirin: Secondary | ICD-10-CM

## 2018-11-26 DIAGNOSIS — Z7952 Long term (current) use of systemic steroids: Secondary | ICD-10-CM

## 2018-11-26 HISTORY — PX: LUMBAR LAMINECTOMY/DECOMPRESSION MICRODISCECTOMY: SHX5026

## 2018-11-26 LAB — CBC
HCT: 34.6 % — ABNORMAL LOW (ref 39.0–52.0)
Hemoglobin: 10.6 g/dL — ABNORMAL LOW (ref 13.0–17.0)
MCH: 28.3 pg (ref 26.0–34.0)
MCHC: 30.6 g/dL (ref 30.0–36.0)
MCV: 92.3 fL (ref 80.0–100.0)
Platelets: 173 10*3/uL (ref 150–400)
RBC: 3.75 MIL/uL — AB (ref 4.22–5.81)
RDW: 15.6 % — ABNORMAL HIGH (ref 11.5–15.5)
WBC: 10.1 10*3/uL (ref 4.0–10.5)
nRBC: 0 % (ref 0.0–0.2)

## 2018-11-26 LAB — BASIC METABOLIC PANEL
Anion gap: 7 (ref 5–15)
BUN: 12 mg/dL (ref 8–23)
CO2: 23 mmol/L (ref 22–32)
Calcium: 8.8 mg/dL — ABNORMAL LOW (ref 8.9–10.3)
Chloride: 105 mmol/L (ref 98–111)
Creatinine, Ser: 1.08 mg/dL (ref 0.61–1.24)
GFR calc Af Amer: >60 mL/min (ref >60)
Glucose, Bld: 194 mg/dL — ABNORMAL HIGH (ref 70–99)
POTASSIUM: 4.1 mmol/L (ref 3.5–5.1)
Sodium: 135 mmol/L (ref 135–145)

## 2018-11-26 LAB — URINALYSIS, ROUTINE W REFLEX MICROSCOPIC
Bilirubin Urine: NEGATIVE
Glucose, UA: NEGATIVE mg/dL
Ketones, ur: NEGATIVE mg/dL
Leukocytes,Ua: NEGATIVE
Nitrite: NEGATIVE
Protein, ur: 30 mg/dL — AB
RBC / HPF: >50 RBC/hpf — ABNORMAL HIGH (ref 0–5)
Specific Gravity, Urine: 1.016 (ref 1.005–1.030)
pH: 6 (ref 5.0–8.0)

## 2018-11-26 LAB — TYPE AND SCREEN
ABO/RH(D): O POS
Antibody Screen: NEGATIVE

## 2018-11-26 LAB — MAGNESIUM: Magnesium: 2 mg/dL (ref 1.7–2.4)

## 2018-11-26 LAB — TROPONIN I: Troponin I: 0.03 ng/mL (ref <0.03)

## 2018-11-26 SURGERY — LUMBAR LAMINECTOMY/DECOMPRESSION MICRODISCECTOMY
Anesthesia: General | Site: Back

## 2018-11-26 MED ORDER — HYDROCODONE-ACETAMINOPHEN 5-325 MG PO TABS
1.0000 | ORAL_TABLET | ORAL | Status: DC | PRN
  Administered 2018-11-27 – 2018-12-03 (×4): 1 via ORAL
  Filled 2018-11-26 (×5): qty 1

## 2018-11-26 MED ORDER — METHOCARBAMOL 500 MG PO TABS: 500.0000 mg | ORAL_TABLET | Freq: Four times a day (QID) | ORAL | 0 refills | Status: DC | PRN

## 2018-11-26 MED ORDER — DOCUSATE SODIUM 100 MG PO CAPS
100.0000 mg | ORAL_CAPSULE | Freq: Every day | ORAL | Status: DC
  Administered 2018-11-26 – 2018-12-03 (×8): 100 mg via ORAL
  Filled 2018-11-26 (×8): qty 1

## 2018-11-26 MED ORDER — SUGAMMADEX SODIUM 200 MG/2ML IV SOLN
INTRAVENOUS | Status: AC
  Filled 2018-11-26: qty 2

## 2018-11-26 MED ORDER — HYDROMORPHONE HCL 1 MG/ML IJ SOLN: 0.5000 mg | INTRAMUSCULAR | Status: DC | PRN

## 2018-11-26 MED ORDER — POLYETHYLENE GLYCOL 3350 17 G PO PACK: 17.0000 g | PACK | Freq: Every day | ORAL | Status: DC | PRN

## 2018-11-26 MED ORDER — BUPIVACAINE-EPINEPHRINE (PF) 0.25% -1:200000 IJ SOLN
INTRAMUSCULAR | Status: DC | PRN
  Administered 2018-11-26: 20 mL

## 2018-11-26 MED ORDER — LIDOCAINE 2% (20 MG/ML) 5 ML SYRINGE
INTRAMUSCULAR | Status: DC | PRN
  Administered 2018-11-26: 80 mg via INTRAVENOUS

## 2018-11-26 MED ORDER — FINASTERIDE 5 MG PO TABS
5.0000 mg | ORAL_TABLET | Freq: Every evening | ORAL | Status: DC
  Administered 2018-11-26 – 2018-12-02 (×6): 5 mg via ORAL
  Filled 2018-11-26 (×6): qty 1

## 2018-11-26 MED ORDER — DEXAMETHASONE SODIUM PHOSPHATE 10 MG/ML IJ SOLN
INTRAMUSCULAR | Status: DC | PRN
  Administered 2018-11-26: 10 mg via INTRAVENOUS

## 2018-11-26 MED ORDER — BUPIVACAINE LIPOSOME 1.3 % IJ SUSP
INTRAMUSCULAR | Status: DC | PRN
  Administered 2018-11-26: 20 mL

## 2018-11-26 MED ORDER — LACTATED RINGERS IV SOLN
INTRAVENOUS | Status: DC
  Administered 2018-11-26 – 2018-12-01 (×10): via INTRAVENOUS

## 2018-11-26 MED ORDER — THROMBIN (RECOMBINANT) 5000 UNITS EX SOLR
CUTANEOUS | Status: AC
  Filled 2018-11-26: qty 10000

## 2018-11-26 MED ORDER — ACETAMINOPHEN 325 MG PO TABS
650.0000 mg | ORAL_TABLET | ORAL | Status: DC | PRN
  Administered 2018-11-28 – 2018-11-29 (×4): 650 mg via ORAL
  Filled 2018-11-26 (×5): qty 2

## 2018-11-26 MED ORDER — PROPOFOL 10 MG/ML IV BOLUS
INTRAVENOUS | Status: AC
  Filled 2018-11-26: qty 20

## 2018-11-26 MED ORDER — FLEET ENEMA 7-19 GM/118ML RE ENEM: 1.0000 | ENEMA | Freq: Once | RECTAL | Status: DC | PRN

## 2018-11-26 MED ORDER — PHENYLEPHRINE HCL-NACL 10-0.9 MG/250ML-% IV SOLN
INTRAVENOUS | Status: AC
  Filled 2018-11-26: qty 250

## 2018-11-26 MED ORDER — MENTHOL 3 MG MT LOZG
1.0000 | LOZENGE | OROMUCOSAL | Status: DC | PRN
  Filled 2018-11-26: qty 9

## 2018-11-26 MED ORDER — ROCURONIUM BROMIDE 10 MG/ML (PF) SYRINGE
PREFILLED_SYRINGE | INTRAVENOUS | Status: DC | PRN
  Administered 2018-11-26: 50 mg via INTRAVENOUS

## 2018-11-26 MED ORDER — THROMBIN 5000 UNITS EX SOLR
OROMUCOSAL | Status: DC | PRN
  Administered 2018-11-26: 12:00:00 via TOPICAL

## 2018-11-26 MED ORDER — FENTANYL CITRATE (PF) 250 MCG/5ML IJ SOLN
INTRAMUSCULAR | Status: AC
  Filled 2018-11-26: qty 5

## 2018-11-26 MED ORDER — BISACODYL 5 MG PO TBEC: 5.0000 mg | DELAYED_RELEASE_TABLET | Freq: Every day | ORAL | Status: DC | PRN

## 2018-11-26 MED ORDER — ACETAMINOPHEN 10 MG/ML IV SOLN: 1000.0000 mg | Freq: Once | INTRAVENOUS | Status: DC | PRN

## 2018-11-26 MED ORDER — HYDROCODONE-ACETAMINOPHEN 10-325 MG PO TABS
2.0000 | ORAL_TABLET | ORAL | Status: DC | PRN
  Administered 2018-11-26 – 2018-12-03 (×7): 2 via ORAL
  Filled 2018-11-26 (×8): qty 2

## 2018-11-26 MED ORDER — AMLODIPINE BESYLATE 5 MG PO TABS
5.0000 mg | ORAL_TABLET | Freq: Every day | ORAL | Status: DC
  Administered 2018-11-26: 5 mg via ORAL
  Filled 2018-11-26: qty 1

## 2018-11-26 MED ORDER — CHLORHEXIDINE GLUCONATE 4 % EX LIQD: 60.0000 mL | Freq: Once | CUTANEOUS | Status: DC

## 2018-11-26 MED ORDER — HYDROXYCHLOROQUINE SULFATE 200 MG PO TABS
200.0000 mg | ORAL_TABLET | Freq: Every day | ORAL | Status: DC
  Administered 2018-11-27 – 2018-12-03 (×7): 200 mg via ORAL
  Filled 2018-11-26 (×7): qty 1

## 2018-11-26 MED ORDER — BACITRACIN-NEOMYCIN-POLYMYXIN 400-5-5000 EX OINT
TOPICAL_OINTMENT | CUTANEOUS | Status: AC
  Filled 2018-11-26: qty 1

## 2018-11-26 MED ORDER — PHENOL 1.4 % MT LIQD: 1.0000 | OROMUCOSAL | Status: DC | PRN

## 2018-11-26 MED ORDER — TRAZODONE HCL 50 MG PO TABS
50.0000 mg | ORAL_TABLET | Freq: Every evening | ORAL | Status: DC | PRN
  Administered 2018-11-26 – 2018-11-29 (×4): 50 mg via ORAL
  Administered 2018-12-01: 100 mg via ORAL
  Filled 2018-11-26: qty 1
  Filled 2018-11-26: qty 2
  Filled 2018-11-26 (×3): qty 1

## 2018-11-26 MED ORDER — DEXAMETHASONE SODIUM PHOSPHATE 10 MG/ML IJ SOLN
INTRAMUSCULAR | Status: AC
  Filled 2018-11-26: qty 1

## 2018-11-26 MED ORDER — LIDOCAINE 2% (20 MG/ML) 5 ML SYRINGE
INTRAMUSCULAR | Status: AC
  Filled 2018-11-26: qty 5

## 2018-11-26 MED ORDER — BACITRACIN-NEOMYCIN-POLYMYXIN 400-5-5000 EX OINT
TOPICAL_OINTMENT | CUTANEOUS | Status: DC | PRN
  Administered 2018-11-26: 1 via TOPICAL

## 2018-11-26 MED ORDER — ONDANSETRON HCL 4 MG/2ML IJ SOLN: 4.0000 mg | Freq: Four times a day (QID) | INTRAMUSCULAR | Status: DC | PRN

## 2018-11-26 MED ORDER — CEFAZOLIN SODIUM-DEXTROSE 1-4 GM/50ML-% IV SOLN
1.0000 g | Freq: Three times a day (TID) | INTRAVENOUS | Status: AC
  Administered 2018-11-26 – 2018-11-27 (×3): 1 g via INTRAVENOUS
  Filled 2018-11-26 (×3): qty 50

## 2018-11-26 MED ORDER — PROMETHAZINE HCL 25 MG/ML IJ SOLN: 6.2500 mg | INTRAMUSCULAR | Status: DC | PRN

## 2018-11-26 MED ORDER — ONDANSETRON HCL 4 MG/2ML IJ SOLN
INTRAMUSCULAR | Status: AC
  Filled 2018-11-26: qty 2

## 2018-11-26 MED ORDER — PREDNISONE 5 MG PO TABS
5.0000 mg | ORAL_TABLET | Freq: Every day | ORAL | Status: DC
  Administered 2018-11-27 – 2018-12-03 (×7): 5 mg via ORAL
  Filled 2018-11-26 (×7): qty 1

## 2018-11-26 MED ORDER — METHOCARBAMOL 500 MG PO TABS
500.0000 mg | ORAL_TABLET | Freq: Four times a day (QID) | ORAL | Status: DC | PRN
  Administered 2018-11-26 – 2018-11-28 (×3): 500 mg via ORAL
  Filled 2018-11-26 (×3): qty 1

## 2018-11-26 MED ORDER — EPHEDRINE 5 MG/ML INJ
INTRAVENOUS | Status: AC
  Filled 2018-11-26: qty 10

## 2018-11-26 MED ORDER — ONDANSETRON HCL 4 MG/2ML IJ SOLN
INTRAMUSCULAR | Status: DC | PRN
  Administered 2018-11-26: 4 mg via INTRAVENOUS

## 2018-11-26 MED ORDER — ACETAMINOPHEN 650 MG RE SUPP: 650.0000 mg | RECTAL | Status: DC | PRN

## 2018-11-26 MED ORDER — CEFAZOLIN SODIUM-DEXTROSE 2-4 GM/100ML-% IV SOLN
2.0000 g | INTRAVENOUS | Status: AC
  Administered 2018-11-26: 2 g via INTRAVENOUS
  Filled 2018-11-26: qty 100

## 2018-11-26 MED ORDER — PROPOFOL 10 MG/ML IV BOLUS
INTRAVENOUS | Status: DC | PRN
  Administered 2018-11-26: 110 mg via INTRAVENOUS

## 2018-11-26 MED ORDER — SUGAMMADEX SODIUM 200 MG/2ML IV SOLN
INTRAVENOUS | Status: DC | PRN
  Administered 2018-11-26: 200 mg via INTRAVENOUS

## 2018-11-26 MED ORDER — ROCURONIUM BROMIDE 100 MG/10ML IV SOLN
INTRAVENOUS | Status: AC
  Filled 2018-11-26: qty 1

## 2018-11-26 MED ORDER — LACTATED RINGERS IV SOLN
INTRAVENOUS | Status: DC
  Administered 2018-11-26 (×2): via INTRAVENOUS

## 2018-11-26 MED ORDER — METOPROLOL TARTRATE 5 MG/5ML IV SOLN: 2.5000 mg | INTRAVENOUS | Status: DC | PRN

## 2018-11-26 MED ORDER — SODIUM CHLORIDE 0.9 % IV SOLN
INTRAVENOUS | Status: AC
  Filled 2018-11-26: qty 500000

## 2018-11-26 MED ORDER — METHOCARBAMOL 500 MG IVPB - SIMPLE MED
500.0000 mg | Freq: Four times a day (QID) | INTRAVENOUS | Status: DC | PRN
  Filled 2018-11-26: qty 50

## 2018-11-26 MED ORDER — SERTRALINE HCL 25 MG PO TABS
25.0000 mg | ORAL_TABLET | Freq: Every day | ORAL | Status: DC
  Administered 2018-11-26 – 2018-12-03 (×8): 25 mg via ORAL
  Filled 2018-11-26 (×8): qty 1

## 2018-11-26 MED ORDER — HYDROMORPHONE HCL 1 MG/ML IJ SOLN: 0.2500 mg | INTRAMUSCULAR | Status: DC | PRN

## 2018-11-26 MED ORDER — EPHEDRINE SULFATE-NACL 50-0.9 MG/10ML-% IV SOSY
PREFILLED_SYRINGE | INTRAVENOUS | Status: DC | PRN
  Administered 2018-11-26: 5 mg via INTRAVENOUS

## 2018-11-26 MED ORDER — FENTANYL CITRATE (PF) 250 MCG/5ML IJ SOLN
INTRAMUSCULAR | Status: DC | PRN
  Administered 2018-11-26 (×2): 50 ug via INTRAVENOUS

## 2018-11-26 MED ORDER — SODIUM CHLORIDE 0.9 % IV SOLN
INTRAVENOUS | Status: DC | PRN
  Administered 2018-11-26: 500 mL

## 2018-11-26 MED ORDER — BUPIVACAINE LIPOSOME 1.3 % IJ SUSP
20.0000 mL | Freq: Once | INTRAMUSCULAR | Status: DC
  Filled 2018-11-26 (×2): qty 20

## 2018-11-26 MED ORDER — PANTOPRAZOLE SODIUM 40 MG PO TBEC
40.0000 mg | DELAYED_RELEASE_TABLET | Freq: Every evening | ORAL | Status: DC
  Administered 2018-11-26 – 2018-12-02 (×6): 40 mg via ORAL
  Filled 2018-11-26 (×6): qty 1

## 2018-11-26 MED ORDER — SODIUM CHLORIDE 0.9 % IV SOLN
INTRAVENOUS | Status: DC | PRN
  Administered 2018-11-26: 40 ug/min via INTRAVENOUS

## 2018-11-26 MED ORDER — ONDANSETRON HCL 4 MG PO TABS: 4.0000 mg | ORAL_TABLET | Freq: Four times a day (QID) | ORAL | Status: DC | PRN

## 2018-11-26 SURGICAL SUPPLY — 46 items
AGENT HMST SPONGE THK3/8 (HEMOSTASIS)
APL SKNCLS STERI-STRIP NONHPOA (GAUZE/BANDAGES/DRESSINGS) ×1
BAG SPEC THK2 15X12 ZIP CLS (MISCELLANEOUS)
BAG ZIPLOCK 12X15 (MISCELLANEOUS) IMPLANT
BENZOIN TINCTURE PRP APPL 2/3 (GAUZE/BANDAGES/DRESSINGS) ×3 IMPLANT
CLEANER TIP ELECTROSURG 2X2 (MISCELLANEOUS) ×3 IMPLANT
COVER SURGICAL LIGHT HANDLE (MISCELLANEOUS) ×3 IMPLANT
COVER WAND RF STERILE (DRAPES) IMPLANT
DRAPE MICROSCOPE LEICA (MISCELLANEOUS) ×5 IMPLANT
DRAPE SHEET LG 3/4 BI-LAMINATE (DRAPES) ×3 IMPLANT
DRAPE SURG 17X11 SM STRL (DRAPES) ×3 IMPLANT
DRSG ADAPTIC 3X8 NADH LF (GAUZE/BANDAGES/DRESSINGS) ×3 IMPLANT
DRSG PAD ABDOMINAL 8X10 ST (GAUZE/BANDAGES/DRESSINGS) ×6 IMPLANT
DURAPREP 26ML APPLICATOR (WOUND CARE) ×3 IMPLANT
ELECT BLADE TIP CTD 4 INCH (ELECTRODE) ×3 IMPLANT
ELECT REM PT RETURN 15FT ADLT (MISCELLANEOUS) ×3 IMPLANT
GAUZE SPONGE 4X4 12PLY STRL (GAUZE/BANDAGES/DRESSINGS) ×3 IMPLANT
GLOVE BIOGEL PI IND STRL 6.5 (GLOVE) ×1 IMPLANT
GLOVE BIOGEL PI IND STRL 8.5 (GLOVE) ×1 IMPLANT
GLOVE BIOGEL PI INDICATOR 6.5 (GLOVE) ×2
GLOVE BIOGEL PI INDICATOR 8.5 (GLOVE) ×2
GLOVE ECLIPSE 8.0 STRL XLNG CF (GLOVE) ×6 IMPLANT
GLOVE SURG SS PI 6.5 STRL IVOR (GLOVE) ×3 IMPLANT
GOWN STRL REUS W/ TWL LRG LVL3 (GOWN DISPOSABLE) ×1 IMPLANT
GOWN STRL REUS W/TWL LRG LVL3 (GOWN DISPOSABLE) ×3
GOWN STRL REUS W/TWL XL LVL3 (GOWN DISPOSABLE) ×3 IMPLANT
HEMOSTAT SPONGE AVITENE ULTRA (HEMOSTASIS) ×1 IMPLANT
KIT BASIN OR (CUSTOM PROCEDURE TRAY) ×3 IMPLANT
MANIFOLD NEPTUNE II (INSTRUMENTS) ×3 IMPLANT
NDL SPNL 18GX3.5 QUINCKE PK (NEEDLE) ×2 IMPLANT
NEEDLE HYPO 22GX1.5 SAFETY (NEEDLE) ×3 IMPLANT
NEEDLE SPNL 18GX3.5 QUINCKE PK (NEEDLE) ×6 IMPLANT
PACK LAMINECTOMY ORTHO (CUSTOM PROCEDURE TRAY) ×3 IMPLANT
PATTIES SURGICAL .5 X.5 (GAUZE/BANDAGES/DRESSINGS) IMPLANT
PATTIES SURGICAL .75X.75 (GAUZE/BANDAGES/DRESSINGS) ×3 IMPLANT
PATTIES SURGICAL 1X1 (DISPOSABLE) IMPLANT
SPONGE LAP 4X18 RFD (DISPOSABLE) ×6 IMPLANT
STAPLER VISISTAT 35W (STAPLE) ×3 IMPLANT
SUT VIC AB 1 CT1 27 (SUTURE) ×6
SUT VIC AB 1 CT1 27XBRD ANTBC (SUTURE) ×2 IMPLANT
SUT VIC AB 2-0 CT1 27 (SUTURE) ×6
SUT VIC AB 2-0 CT1 TAPERPNT 27 (SUTURE) ×2 IMPLANT
SYR 20CC LL (SYRINGE) ×3 IMPLANT
TOWEL OR 17X26 10 PK STRL BLUE (TOWEL DISPOSABLE) ×3 IMPLANT
TOWEL OR NON WOVEN STRL DISP B (DISPOSABLE) ×3 IMPLANT
YANKAUER SUCT BULB TIP NO VENT (SUCTIONS) ×2 IMPLANT

## 2018-11-26 NOTE — Interval H&P Note (Signed)
History and Physical Interval Note:  11/26/2018 9:30 AM  Derrick [name]  has presented today for surgery, with the diagnosis of Lumbar stenosis L2-3, L3-4  The various methods of treatment have been discussed with the patient and family. After consideration of risks, benefits and other options for treatment, the patient has consented to  Procedure(s) with comments: Lumbar central decompression L2-3, L3-4 (N/A) - 155min as a surgical intervention .  The patient's history has been reviewed, patient examined, no change in status, stable for surgery.  I have reviewed the patient's chart and labs.  Questions were answered to the patient's satisfaction.     [name]

## 2018-11-26 NOTE — Op Note (Signed)
NAME: Derrick [name], [place] MEDICAL RECORD [id number] ACCOUNT [id number] DATE OF BIRTH:07-Feb-1932 [facility] LOCATION: [facility] PHYSICIAN:Audric Venn Fransico Setters, MD  OPERATIVE REPORT  DATE OF PROCEDURE:  11/26/2018  SURGEON:  [name], MD  ASSISTANT:  Ardeen Jourdain PA.  PREOPERATIVE DIAGNOSES: 1.  Severe spinal stenosis with a complete block at L2-L3. 2.  Severe spinal stenosis with a complete block at L3-L4. 3.  Foraminal stenosis involving the L3 and the L4 nerve roots bilaterally. 4.  Partial foot drop on the left with marked weakness of the entire left leg including the hip flexors on the left.  POSTOPERATIVE DIAGNOSES: 1.  Severe spinal stenosis with a complete block at L2-L3. 2.  Severe spinal stenosis with a complete block at L3-L4. 3.  Foraminal stenosis involving the L3 and the L4 nerve roots bilaterally. 4.  Partial foot drop on the left with marked weakness of the entire left leg including the hip flexors on the left.  OPERATION: 1.  Complete decompressive lumbar laminectomy at L2-L3. 2.  Complete decompressive lumbar laminectomy at L3-L4. 3.  Foraminotomy for the L3 and the L4 nerve roots bilaterally.  DESCRIPTION OF PROCEDURE:  Under general anesthesia, the patient on a Wilson frame routine orthopedic prep and draping of the back was carried out.  The patient had 2 grams of IV Ancef.  The appropriate time-out was carried out first.  At this time, 2  needles were placed in the back for localization purposes.  An x-ray was taken.  Following this, the L2 and L3 spinous processes were identified.  I then stripped the muscle from the lamina and spinous processes bilaterally.  Bleeders were identified and  cauterized.  At this time, the self-retaining retractors were inserted.  We then took another x-ray with Kocher clamps in place.  Following that, the [facility] retractors were inserted.  I then began the decompression by removing the spinous processes  of L2 and L3.  We  went down centrally and completely removed the lamina on both sides.  At this time, we noted a severe stenotic area at L2-3 and also at L3-4, more at L2-3 toward the left.  We brought the microscope in and then gently removed the  ligamentum flavum.  We went out and decompressed the lateral recesses.  We did foraminotomies for L3 and the L4 root bilaterally.  We then continued proximally and distally in our decompression until we can freely move with the hockey stick around  without any further blocks.  We were able to grow out the foramina as well.  We thoroughly irrigated out the area.  We loosely applied some thrombin-soaked Gelfoam and closed the wound in layers in the usual fashion except I left a small distal deep and  proximal part of the wound open for drainage purposes.  Note, I also would like to state that several other x-rays were taken with the instruments in place to further identify our dissection.  At the beginning of the procedure, I injected 20 mL of 0.25%  Marcaine with epinephrine to control bleeding.  At the end of the procedure prior to closing the skin, we injected 20 mL of Exparel and then closed the wound with staples.  Sterile dressings were applied.  The patient left the operating room in  satisfactory condition.  Note, Foley catheter was inserted at the beginning of the procedure, there was some blood and cloudy fluid in the urine, so we sent it for urinalysis and cultures.  The patient will be admitted overnight  and most likely  discharged by noon the next day.  TN/NUANCE  D:11/26/2018 T:11/26/2018 JOB:[id number]

## 2018-11-26 NOTE — Progress Notes (Signed)
RN called report to [place], 4th floor RN. All questions answered.   Night shift staff to transfer patient to 1407.

## 2018-11-26 NOTE — Brief Op Note (Signed)
11/26/2018  12:22 PM  PATIENT:  Derrick [name]  85 y.o. male  PRE-OPERATIVE DIAGNOSIS:  Lumbar stenosis L2-3, L3-4 with complete blocks at both levels. Partial Foot drop on the left with weakness in his Left Hip Flexors.Foraminal Stenosis involving the L-3 and L-4 nerve roots bilaterally. POST-OPERATIVE DIAGNOSIS:  Same as Pre-Op  PROCEDURE:  Procedure(s) with comments: Lumbar central decompression L2-3, L3-4 (N/A) - 180min.for complete Blocks at both Levels.Foraminotomies for the L-3 and L-4 nerve roots bilaterally.  SURGEON:  Surgeon(s) and Role:    * [name], MD - Primary  PHYSICIAN ASSISTANT: Ardeen Jourdain PA  ASSISTANTS:Amber [place] PA   ANESTHESIA:   general  EBL:  200 mL   BLOOD ADMINISTERED:none  DRAINS: none   LOCAL MEDICATIONS USED:  MARCAINE 20cc of 0.25% with Epinephrine at the start of the case and 20cc of Exparel at the end of the case.     SPECIMEN:  No Specimen  DISPOSITION OF SPECIMEN:  N/A  COUNTS:  YES  TOURNIQUET:  * No tourniquets in log *  DICTATION: .Other Dictation: Dictation Number [id number]  PLAN OF CARE: Admit for overnight observation  PATIENT DISPOSITION:  PACU - hemodynamically stable.   Delay start of Pharmacological VTE agent (>24hrs) due to surgical blood loss or risk of bleeding: yes

## 2018-11-26 NOTE — Consult Note (Signed)
PROGRESS NOTE  Derrick [name]  YWV:[id number] DOB: [birth year] DOA: 11/26/2018 PCP: [name], MD  Consulting physician: Dr. Lyla Glassing Reason for consult: Rapid heart rate  History: DAILY CRATE is an 85 y.o. male with a history of nonobstructive CAD, stage III CKD, RCC s/p right nephrectomy 2000, prostate CA, BPH, RA on prednisone and plaquenil, anxiety, lacunar infarct, right transmetatarsal amputation who developed back pain radiating down left leg accompanied by LLE weakness found to have lumbosacral spinal stenosis and underwent decompressive laminectomy L2-L3, L3-L4, and bilateral L3 and L4 foraminotomy by Dr. Gladstone Lighter on 2/19. Stable in PACU, he was taken to the floor and upon standing developing rapid irregular heart rate for which hospitalist service is consulted.   Subjective: Patient has no complaints, eating dinner. Pain is controlled. Felt his heart racing when he got up which he always feels when exerting himself. He denies chest pain now or recently, no orthopnea, PND, leg swelling. Oxygen was applied though he wasn't and isn't short of breath. Denies GI bleeding.   ROS: Specifically denies fever, chills, weight loss, changes in vision or hearing, headache, cough, sore throat, abdominal pain, nausea, vomiting, changes in bowel habits, blood in stool, myalgias, arthralgias, and rash.    Past Medical History:  Diagnosis Date  . Anemia   . Anxiety   . Arthritis, rheumatoid ([place])   . BPH (benign prostatic hyperplasia)   . CAD (coronary artery disease)   . CKD (chronic kidney disease)   . Depression   . Diverticulosis   . Elevated PSA   . Gangrene ([place])    of toe right foot  . GERD (gastroesophageal reflux disease)   . Herpes simplex labialis   . High cholesterol   . History of colonic polyps   . Hypertension   . Insomnia   . Lacunar infarction ([place])   . Memory loss   . Obesity   . Panic attacks   . Prostate cancer ([place]) YRS AGO  . Renal cell cancer ([place])    s/p  nephrectomy in 2000  . Seropositive rheumatoid arthritis ([place])   . Spermatocele   . Tinnitus   . Tremor of both hands    Past Surgical History:  Procedure Laterality Date  . AMPUTATION TOE Right 06/13/2017   Procedure: AMPUTATION TOE 1st and 2nd;  Surgeon: [name], DPM;  Location: [place];  Service: Podiatry;  Laterality: Right;  . AMPUTATION TOE     RIGHT FOOT ALL TOES GONE  . BACK SURGERY  YRS AGO   LOWER  . COLONOSCOPY    . GRAFT APPLICATION Right 8/54/6270   Procedure: EPIFIX GRAFT APPLICATION OF RIGHT FOOT;  Surgeon: [name], DPM;  Location: [place];  Service: Podiatry;  Laterality: Right;  . I&D EXTREMITY Right 09/13/2017   Procedure: IRRIGATION AND DEBRIDEMENT AND BONE BIOPSY;  Surgeon: [name], DPM;  Location: [place];  Service: Podiatry;  Laterality: Right;  . INCISION AND DRAINAGE OF WOUND Right 12/18/2017   Procedure: IRRIGATION AND DEBRIDEMENT WOUND ON RIGHT FOOT;  Surgeon: [name], DPM;  Location: [place];  Service: Podiatry;  Laterality: Right;  . IR ANGIOGRAM EXTREMITY RIGHT  05/30/2017  . IR ANGIOGRAM EXTREMITY RIGHT  08/08/2017  . IR ANGIOGRAM SELECTIVE EACH ADDITIONAL VESSEL  08/08/2017  . IR ANGIOGRAM SELECTIVE EACH ADDITIONAL VESSEL  08/08/2017  . IR RADIOLOGIST EVAL & MGMT  05/16/2017  . IR TIB-PERO ART PTA MOD SED  05/30/2017  . IR TIB-PERO ART PTA MOD SED  08/08/2017  . IR TIB-PERO ART UNI PTA EA ADD VESSEL MOD SED  05/30/2017  . IR TIB-PERO ART UNI PTA EA ADD VESSEL MOD SED  08/08/2017  . IR US GUIDE VASC ACCESS RIGHT  05/30/2017  . IR US GUIDE VASC ACCESS RIGHT  08/08/2017  . LEFT HEART CATHETERIZATION WITH CORONARY ANGIOGRAM N/A 11/19/2011   Procedure: LEFT HEART CATHETERIZATION WITH CORONARY ANGIOGRAM;  Surgeon: [name], MD;  Location: [facility] CATH LAB;  Service: Cardiovascular;  Laterality: N/A;  . NEPHRECTOMY  10/1998  . TRANSMETATARSAL AMPUTATION Right 08/14/2017   Procedure: TRANSMETATARSAL AMPUTATION;  Surgeon: [name], DPM;   Location: [place];  Service: Podiatry;  Laterality: Right;   - SH: Remote smoker, lives with longtime wife, usually gets around with motorized chair. No EtOH.  - Allergies: NKDA - FH: Family History  Problem Relation Age of Onset  . Heart attack Father 40  . Stroke Father   . Kidney failure Sister        Bright's disease   . Cancer Paternal Aunt        d. 41s; NOS cancer of leg  . Kidney disease Sister        d. 31; "Bright's disease" - possible kidney cancer?  . Cancer Sister        NOS cancer; d. 55s; "sick in the hospital"  . Heart attack Son 92  . Leukemia Son 28  . COPD Son   . Breast cancer Daughter 63   - Meds: Medication Sig  acetaminophen (TYLENOL) 500 MG tablet Take 1,000 mg by mouth every 6 (six) hours as needed for moderate pain or headache.  amLODipine (NORVASC) 5 MG tablet Take 1 tablet (5 mg total) by mouth daily. SCHEDULE OV FOR FURTHER REFILLS. Patient taking differently: Take 5 mg by mouth every evening. SCHEDULE OV FOR FURTHER REFILLS.  aspirin EC 81 MG tablet Take 81 mg by mouth at bedtime.   clopidogrel (PLAVIX) 75 MG tablet Take 75 mg by mouth daily.  docusate sodium (COLACE) 100 MG capsule Take 100 mg by mouth daily.   finasteride (PROSCAR) 5 MG tablet Take 5 mg by mouth every evening.   gabapentin (NEURONTIN) 100 MG capsule Take 1 capsule (100 mg total) by mouth 3 (three) times daily. Patient taking differently: Take 100 mg by mouth at bedtime.   hydroxychloroquine (PLAQUENIL) 200 MG tablet Take 200 mg by mouth daily.  Multiple Vitamin (MULTIVITAMIN WITH MINERALS) TABS tablet Take 1 tablet by mouth every evening.   Omega-3-6-9 CAPS Take 2 capsules by mouth daily.  oxyCODONE-acetaminophen (PERCOCET/ROXICET) 5-325 MG tablet Take 1 tablet by mouth every 6 (six) hours as needed for pain.  pantoprazole (PROTONIX) 40 MG tablet Take 40 mg by mouth every evening.   predniSONE (DELTASONE) 5 MG tablet Take 5 mg by mouth daily with breakfast.  sertraline (ZOLOFT) 25  MG tablet Take 25 mg by mouth daily.  traZODone (DESYREL) 50 MG tablet Take 50-100 mg by mouth at bedtime as needed for sleep.   methocarbamol (ROBAXIN) 500 MG tablet Take 1 tablet (500 mg total) by mouth every 6 (six) hours as needed for muscle spasms.  oxyCODONE (ROXICODONE) 5 MG immediate release tablet Take 0.5-1 tablets (2.5-5 mg total) by mouth every 6 (six) hours as needed for moderate pain or severe pain (0.5 tablets for moderate pain, 1 tablet for severe pain). Patient not taking: Reported on 11/12/2018   Objective: Vitals:   11/26/18 1617 11/26/18 1700 11/26/18 1722 11/26/18 1730  BP: 137/85  139/80   Pulse: (!) 114 (!) 160 (!) 101 (!) 125  Resp: 16  18   Temp: 98.8 F (37.1 C)  98.5 F (36.9 C)   TempSrc: Oral  Oral   SpO2: 99%  100%   Weight:      Height:       Gen: Elderly male in no distress Pulm: Non-labored breathing. Clear to auscultation bilaterally.  CV: Irregular tachycardia with definite p waves on continuous monitoring. No murmur, rub, or gallop. No JVD, no pedal edema. GI: Abdomen soft, non-tender, non-distended, with normoactive bowel sounds. No organomegaly or masses felt. Ext: Warm, right transmetatarsal amputation noted. Diminished DP pulses bilaterally. Skin: No rashes, lesions or ulcers Neuro: Alert, oriented. Diminished strength on LLE, not fully evaluated postoperatively. No other neurological deficits. Psych: Judgement and insight appear fair, some clear deficits in short term and longterm recall. Mood & affect appropriate.   Data Reviewed: I have personally reviewed following labs and imaging studies performed 2 days ago.  CBC: Recent Labs  Lab 11/24/18 1122  WBC 8.2  NEUTROABS 5.0  HGB 12.9*  HCT 40.5  MCV 90.4  PLT 657   Basic Metabolic Panel: Recent Labs  Lab 11/24/18 1122  NA 138  K 4.4  CL 106  CO2 23  GLUCOSE 116*  BUN 16  CREATININE 1.30*  CALCIUM 9.6   Liver Function Tests: Recent Labs  Lab 11/24/18 1122  AST 24  ALT  24  ALKPHOS 42  BILITOT 1.1  PROT 6.9  ALBUMIN 4.5   Coagulation Profile: Recent Labs  Lab 11/24/18 1122  INR 0.96   Urine analysis:    Component Value Date/Time   COLORURINE RED (A) 11/26/2018 1036   APPEARANCEUR HAZY (A) 11/26/2018 1036   LABSPEC 1.016 11/26/2018 1036   PHURINE 6.0 11/26/2018 1036   GLUCOSEU NEGATIVE 11/26/2018 1036   HGBUR LARGE (A) 11/26/2018 1036   BILIRUBINUR NEGATIVE 11/26/2018 1036   KETONESUR NEGATIVE 11/26/2018 1036   PROTEINUR 30 (A) 11/26/2018 1036   UROBILINOGEN 1.0 11/27/2013 0947   NITRITE NEGATIVE 11/26/2018 1036   LEUKOCYTESUR NEGATIVE 11/26/2018 1036   Assessment & Plan: Tachycardia: Wandering atrial pacemaker vs. sinus tachycardia with frequent PACs vs. multifocal atrial tachycardia. ECG reviewed personally this evening demonstrates p waves, so is not felt to be AFib with RVR. Reviewed also with cardiology, Dr. Tamala Julian, who agrees with interpretation that this is not AFib. This abnormality has occurred in previous ECGs.  - Probably due to adrenergic drive postop. Does not appear septic, no severe EBL reported intraoperatively, had IVF's though he's taking good po. - Slow rate with metoprolol IV low dose.  - Will hold norvasc for now (was given earlier today) to avoid hypotension.  - No indication for anticoagulation.  - Will transfer to telemetry floor for continuous monitoring. - Check metabolic panel, supplement K and Mg as indicated.   Nonobstructive CAD: Low risk stress test 2017, no recent or current angina. Preserved EF also noted on echo at that time, no recent or current HF symptoms. Was cleared for surgery by cardiology last month.  - Check troponin. - Per cardiology outpatient note, restart ASA, plavix ASAP. Timing deferred to primary service.  Gross hematuria: In pt w/BPH and foley catheterization. Also has hx prostate CA and RCC. No pain. >50RBCs/HPF on UA 2/17.  - Check CBC now and in AM, hgb 12.9 2/17.  - Monitor overnight,  irrigate prn. Would likely benefit from outpatient cystoscopy.  - Continue finasteride  Spinal stenosis s/p  decompressive laminectomy, foraminotomy 2/19:  - Postoperative care per primary  RA:  - Continue plaquenil and prednisone. No current indication for stress dosing.  Stage III CKD: CrCl 46.12ml/min - Avoid nephrotoxins  HTN: Hold norvasc, give prn metoprolol as above  Anxiety, insomnia: Continue zoloft, trazodone.    Patrecia Pour, MD [facility] [email or web address] Password [facility] 11/26/2018, 6:32 PM

## 2018-11-26 NOTE — Anesthesia Procedure Notes (Signed)
Procedure Name: Intubation Date/Time: 11/26/2018 10:14 AM Performed by: Talbot Grumbling, CRNA Pre-anesthesia Checklist: Patient identified, Emergency Drugs available, Suction available and Patient being monitored Patient Re-evaluated:Patient Re-evaluated prior to induction Oxygen Delivery Method: Circle system utilized Preoxygenation: Pre-oxygenation with 100% oxygen Induction Type: IV induction Ventilation: Mask ventilation without difficulty Laryngoscope Size: Mac and 3 Grade View: Grade I Tube type: Oral Tube size: 8.0 mm Number of attempts: 1 Airway Equipment and Method: Stylet Placement Confirmation: ETT inserted through vocal cords under direct vision,  breath sounds checked- equal and bilateral and positive ETCO2 Secured at: 23 cm Tube secured with: Tape Dental Injury: Teeth and Oropharynx as per pre-operative assessment

## 2018-11-26 NOTE — Discharge Instructions (Addendum)
Showers only. No tub bath No lifting or excessive bending. No driving while taking pain medications.  Call Dr. Gladstone Lighter if any wound complications or temperature of 101 degrees F or over.  Call the office for an appointment to see Dr. Gladstone Lighter in two weeks: [phone] and ask for Dr. Charlestine Night medical assistant, Brunilda Payor.

## 2018-11-26 NOTE — Evaluation (Signed)
Physical Therapy Evaluation Patient Details Name: Derrick [name] MRN: [id number] DOB: 1932-02-24 Today's Date: 11/26/2018   History of Present Illness  85 yo male s/p lumbar decompression L2-L3, L3-L4 on 11/26/18. PMH includs anemia, anxiety, CAD, CKD, depression, diverticulosis, gangrene, HTN, lacunar infarct, memory loss, obesity, prostate cancer, renal cancer, RA, R transmet amputation, nephrectomy.   Clinical Impression   Pt presents with severe back pain, penile pain related to catheter, tachycardia/irregular heartbeat per RN with minimal mobility, difficulty performing all mobility tasks, decreased recall and application of back precautions, and anxiety with mobility. Pt to benefit from acute PT to address deficits. Pt unable to stand safely from recliner, required squat pivot transfer from chair to bed in order to return to bed. Based on eval, PT recommending SNF placement due to pt deficits, paired with difficulty maintaining back precautions. PT to progress mobility as tolerated, and will continue to follow acutely.    Pt with HR 80s-167 bpm during session, as registered on continuous pulse ox. Readings not consistent with dynmap readings, but dynmap readings appeared to be inaccurate as well. RN notified when HR up to 150s bpm with LE strength testing, RN present for all further mobility to get pt back to bed. RN listened to heart sounds, and RN states irregular heart rate.      Follow Up Recommendations SNF;Supervision/Assistance - 24 hour(SNF vs home with assist of family, pending pt improvement )    Equipment Recommendations  None recommended by PT    Recommendations for Other Services       Precautions / Restrictions Precautions Precautions: Fall;Back Precaution Booklet Issued: Yes (comment) Precaution Comments: reviewed with pt and family at bedside, pt shows lack of carryover into mobility and requires multimodal cues to maintain precautions.  Restrictions Weight Bearing  Restrictions: No      Mobility  Bed Mobility Overal bed mobility: Needs Assistance Bed Mobility: Sit to Sidelying;Rolling Rolling: Mod assist;+2 for physical assistance       Sit to sidelying: Mod assist;+2 for physical assistance General bed mobility comments: Pt up in chair upon arrival to room. Pt required mod assist for sit to sidelying for bringing LEs onto bed as pt lowered trunk, and rolling onto back in coordination with LEs.   Transfers Overall transfer level: Needs assistance Equipment used: Rolling walker (2 wheeled) Transfers: Sit to/from [employer] Sit to Stand: Mod assist;+2 physical assistance;+2 safety/equipment         General transfer comment: Mod assist for power up, steadying upon standing. Max multimodal reinforcements of spinal precautions, but pt with forward flexion at back during standing. Pt unable to take steps once standing, pt with UE shaking. Pt sat back down. Pt with mod assist for pivoting to bed from drop arm recliner for UE placement in bed, scooting to edge of recliner, and translating onto bed while maintaining back precautions.   Ambulation/Gait Ambulation/Gait assistance: (unable to attempt )              Stairs            Wheelchair Mobility    Modified Rankin (Stroke Patients Only)       Balance Overall balance assessment: Needs assistance Sitting-balance support: No upper extremity supported;Feet supported Sitting balance-Leahy Scale: Fair     Standing balance support: Bilateral upper extremity supported Standing balance-Leahy Scale: Zero Standing balance comment: unable to stand without PT +2 assist  Pertinent Vitals/Pain Pain Assessment: Faces Faces Pain Scale: Hurts whole lot Pain Location: back, penis from catheter Pain Descriptors / Indicators: Sore Pain Intervention(s): Limited activity within patient's tolerance;Repositioned;Monitored during session     Home Living Family/patient expects to be discharged to:: Private residence Living Arrangements: Spouse/significant other Available Help at Discharge: Family;Available 24 hours/day(wife and son ) Type of Home: House Home Access: Stairs to enter Entrance Stairs-Rails: Right;Left;Can reach both Entrance Stairs-Number of Steps: 2 Home Layout: One level Home Equipment: Walker - 4 wheels;Bedside commode;Cane - single point;Wheelchair - manual;Shower seat      Prior Function Level of Independence: Needs assistance   Gait / Transfers Assistance Needed: Pt able to transfer with assist of wife and son, but per family pt was being pushed around the house on the seat of a rollator because he could not walk. Pt was carried in and out of house with wheelchair   ADL's / Homemaking Assistance Needed: Pt states he can do his own dressing/bathing, but wife and son at bedside say they at times needed to help him         Hand Dominance   Dominant Hand: Right    Extremity/Trunk Assessment   Upper Extremity Assessment Upper Extremity Assessment: Defer to OT evaluation    Lower Extremity Assessment Lower Extremity Assessment: Generalized weakness;LLE deficits/detail LLE Deficits / Details: able to perform quad set, and ankle pumps; unable to perform heel slide or LAQ LLE Sensation: decreased light touch(L plantar surface of foot )    Cervical / Trunk Assessment Cervical / Trunk Assessment: Normal  Communication   Communication: No difficulties  Cognition Arousal/Alertness: Awake/alert Behavior During Therapy: Anxious Overall Cognitive Status: Impaired/Different from baseline Area of Impairment: Memory;Orientation;Following commands;Safety/judgement;Problem solving                 Orientation Level: Person;Disoriented to;Place   Memory: Decreased short-term memory;Decreased recall of precautions Following Commands: Follows one step commands inconsistently;Follows one step  commands with increased time     Problem Solving: Difficulty sequencing;Requires verbal cues;Slow processing;Requires tactile cues General Comments: Pt unable to recall birthday, and thought he was home. Pt reoriented to self and location. Pt with difficulty maintaining precautions without near-constant reinforcement. Pt perseverating on "needing to go to the bathroom, take me to the bathroom" and pt informed over and over again that he had a catheter.       General Comments General comments (skin integrity, edema, etc.): Pt with HR 80s-167 bpm during session, as registered on continuous pulse ox. Readings not consistent with dynmap readings, but dynmap readings appeared to be inaccurate as well. RN notified when HR up to 150s bpm with LE strength testing, RN present for all further mobility to get pt back to bed.     Exercises     Assessment/Plan    PT Assessment Patient needs continued PT services  PT Problem List Decreased strength;Pain;Decreased range of motion;Decreased cognition;Decreased activity tolerance;Decreased knowledge of use of DME;Decreased balance;Decreased mobility;Decreased safety awareness;Decreased knowledge of precautions       PT Treatment Interventions DME instruction;Therapeutic activities;Gait training;Therapeutic exercise;Patient/family education;Balance training;Stair training;Functional mobility training    PT Goals (Current goals can be found in the Care Plan section)  Acute Rehab PT Goals Patient Stated Goal: none stated  PT Goal Formulation: With patient Time For Goal Achievement: 12/10/18 Potential to Achieve Goals: Good    Frequency Min 5X/week   Barriers to discharge        Co-evaluation  AM-PAC PT "6 Clicks" Mobility  Outcome Measure Help needed turning from your back to your side while in a flat bed without using bedrails?: A Lot Help needed moving from lying on your back to sitting on the side of a flat bed without using  bedrails?: A Lot Help needed moving to and from a bed to a chair (including a wheelchair)?: A Lot Help needed standing up from a chair using your arms (e.g., wheelchair or bedside chair)?: A Lot Help needed to walk in hospital room?: Total Help needed climbing 3-5 steps with a railing? : Total 6 Click Score: 10    End of Session Equipment Utilized During Treatment: Gait belt Activity Tolerance: Patient limited by pain;Other (comment)(Pt limited by tachycardia and irregular heartbeat) Patient left: in bed;with bed alarm set;with call bell/phone within reach;with family/visitor present;with SCD's reapplied Nurse Communication: Mobility status PT Visit Diagnosis: Muscle weakness (generalized) (M62.81);Other abnormalities of gait and mobility (R26.89)    Time: 6962-9528 PT Time Calculation (min) (ACUTE ONLY): 32 min   Charges:   PT Evaluation $PT Eval Low Complexity: 1 Low PT Treatments $Self Care/Home Management: 8-22      [name], PT Acute Rehabilitation Services Pager [phone]  Office [phone]   Josejulian Tarango D Elonda Husky 11/26/2018, 8:13 PM

## 2018-11-26 NOTE — Progress Notes (Signed)
   11/26/18 1730  MEWS Assessment  Is this an acute change? Yes  MEWS guidelines implemented *See Row Information* Yellow  Provider Notification  Provider Name/Title Dr. Gladstone Lighter  Date Provider Notified 11/26/18  Time Provider Notified 2641  Notification Type Page  Notification Reason Other (Comment) (RN assessment)  Date of Provider Response 11/26/18   [place] returned page at 1745. Ordered an EKG, and stated he was going to page Hospitalist Team.    Will obtain EKG and continue monitoring patient.

## 2018-11-26 NOTE — Transfer of Care (Signed)
Immediate Anesthesia Transfer of Care Note  Patient: Derrick [name]  Procedure(s) Performed: Lumbar central decompression L2-3, L3-4 (N/A Back)  Patient Location: PACU  Anesthesia Type:General  Level of Consciousness: sedated  Airway & Oxygen Therapy: Patient Spontanous Breathing and Patient connected to face mask oxygen  Post-op Assessment: Report given to RN and Post -op Vital signs reviewed and stable  Post vital signs: Reviewed and stable  Last Vitals:  Vitals Value Taken Time  BP 168/99 11/26/2018 12:33 PM  Temp    Pulse 80 11/26/2018 12:34 PM  Resp 17 11/26/2018 12:34 PM  SpO2 100 % 11/26/2018 12:34 PM  Vitals shown include unvalidated device data.  Last Pain:  Vitals:   11/26/18 0758  TempSrc: Oral  PainSc: 8          Complications: No apparent anesthesia complications

## 2018-11-26 NOTE — Anesthesia Postprocedure Evaluation (Signed)
Anesthesia Post Note  Patient: Derrick [name]  Procedure(s) Performed: Lumbar central decompression L2-3, L3-4 (N/A Back)     Patient location during evaluation: PACU Anesthesia Type: General Level of consciousness: awake and alert Pain management: pain level controlled Vital Signs Assessment: post-procedure vital signs reviewed and stable Respiratory status: spontaneous breathing, nonlabored ventilation, respiratory function stable and patient connected to nasal cannula oxygen Cardiovascular status: blood pressure returned to baseline and stable Postop Assessment: no apparent nausea or vomiting Anesthetic complications: no    Last Vitals:  Vitals:   11/26/18 1359 11/26/18 1520  BP: 140/73 (!) 127/97  Pulse: 86 96  Resp: 18 18  Temp: 36.8 C 37.2 C  SpO2: 100% 100%    Last Pain:  Vitals:   11/26/18 1520  TempSrc: Oral  PainSc:                  Arnita Koons S

## 2018-11-26 NOTE — H&P (Signed)
Derrick [name] is an 85 y.o. male.   Chief Complaint: back pain HPI: The patient presented to the office with the chief complaint of low back pain. He started having low back pain in December of last year with no known injury. He developed left leg pain as well. He noted weakness in his left LE as well. He had to start using a wheelchair because of his symptoms. CT myelogram was ordered and show a complete block at L2-L3 and L3-L4. He does have a history of decompression at L5-S1 about 30 years ago. He did not improve with conservative measures, therefore the decision for surgery was made.   Past Medical History:  Diagnosis Date  . Anemia   . Anxiety   . Arthritis, rheumatoid ([place])   . BPH (benign prostatic hyperplasia)   . CAD (coronary artery disease)   . CKD (chronic kidney disease)   . Depression   . Diverticulosis   . Elevated PSA   . Gangrene ([place])    of toe right foot  . GERD (gastroesophageal reflux disease)   . Herpes simplex labialis   . High cholesterol   . History of colonic polyps   . Hypertension   . Insomnia   . Lacunar infarction ([place])   . Memory loss   . Obesity   . Panic attacks   . Prostate cancer ([place]) YRS AGO  . Renal cell cancer ([place])    s/p nephrectomy in 2000  . Seropositive rheumatoid arthritis ([place])   . Spermatocele   . Tinnitus   . Tremor of both hands     Past Surgical History:  Procedure Laterality Date  . AMPUTATION TOE Right 06/13/2017   Procedure: AMPUTATION TOE 1st and 2nd;  Surgeon: [name], DPM;  Location: [facility];  Service: Podiatry;  Laterality: Right;  . AMPUTATION TOE     RIGHT FOOT ALL TOES GONE  . BACK SURGERY  YRS AGO   LOWER  . COLONOSCOPY    . GRAFT APPLICATION Right 1/61/0960   Procedure: EPIFIX GRAFT APPLICATION OF RIGHT FOOT;  Surgeon: [name], DPM;  Location: [facility];  Service: Podiatry;  Laterality: Right;  . I&D EXTREMITY Right 09/13/2017   Procedure: IRRIGATION AND DEBRIDEMENT AND BONE BIOPSY;  Surgeon: [name], DPM;  Location: [facility];  Service: Podiatry;  Laterality: Right;  . INCISION AND DRAINAGE OF WOUND Right 12/18/2017   Procedure: IRRIGATION AND DEBRIDEMENT WOUND ON RIGHT FOOT;  Surgeon: [name], DPM;  Location: [facility];  Service: Podiatry;  Laterality: Right;  . IR ANGIOGRAM EXTREMITY RIGHT  05/30/2017  . IR ANGIOGRAM EXTREMITY RIGHT  08/08/2017  . IR ANGIOGRAM SELECTIVE EACH ADDITIONAL VESSEL  08/08/2017  . IR ANGIOGRAM SELECTIVE EACH ADDITIONAL VESSEL  08/08/2017  . IR RADIOLOGIST EVAL & MGMT  05/16/2017  . IR TIB-PERO ART PTA MOD SED  05/30/2017  . IR TIB-PERO ART PTA MOD SED  08/08/2017  . IR TIB-PERO ART UNI PTA EA ADD VESSEL MOD SED  05/30/2017  . IR TIB-PERO ART UNI PTA EA ADD VESSEL MOD SED  08/08/2017  . IR US GUIDE VASC ACCESS RIGHT  05/30/2017  . IR US GUIDE VASC ACCESS RIGHT  08/08/2017  . LEFT HEART CATHETERIZATION WITH CORONARY ANGIOGRAM N/A 11/19/2011   Procedure: LEFT HEART CATHETERIZATION WITH CORONARY ANGIOGRAM;  Surgeon: [name], MD;  Location: [facility] CATH LAB;  Service: Cardiovascular;  Laterality: N/A;  . NEPHRECTOMY  10/1998  . TRANSMETATARSAL AMPUTATION Right 08/14/2017  Procedure: TRANSMETATARSAL AMPUTATION;  Surgeon: [name], DPM;  Location: [facility];  Service: Podiatry;  Laterality: Right;    Family History  Problem Relation Age of Onset  . Heart attack Father 28  . Stroke Father   . Kidney failure Sister        Bright's disease   . Cancer Paternal Aunt        d. 77s; NOS cancer of leg  . Kidney disease Sister        d. 57; "Bright's disease" - possible kidney cancer?  . Cancer Sister        NOS cancer; d. 47s; "sick in the hospital"  . Heart attack Son 27  . Leukemia Son 29  . COPD Son   . Breast cancer Daughter 44   Social History:  reports that he quit smoking about 40 years ago. His smoking use included cigarettes. He has a 29.00 pack-year smoking history. He has never used smokeless tobacco. He reports that he does not drink alcohol or  use drugs.  Allergies: No Known Allergies  Medications Prior to Admission  Medication Sig Dispense Refill  . acetaminophen (TYLENOL) 500 MG tablet Take 1,000 mg by mouth every 6 (six) hours as needed for moderate pain or headache.    Marland Kitchen amLODipine (NORVASC) 5 MG tablet Take 1 tablet (5 mg total) by mouth daily. SCHEDULE OV FOR FURTHER REFILLS. (Patient taking differently: Take 5 mg by mouth every evening. SCHEDULE OV FOR FURTHER REFILLS.) 60 tablet 0  . aspirin EC 81 MG tablet Take 81 mg by mouth at bedtime.     . clopidogrel (PLAVIX) 75 MG tablet Take 75 mg by mouth daily.    Marland Kitchen docusate sodium (COLACE) 100 MG capsule Take 100 mg by mouth daily.     . finasteride (PROSCAR) 5 MG tablet Take 5 mg by mouth every evening.     . gabapentin (NEURONTIN) 100 MG capsule Take 1 capsule (100 mg total) by mouth 3 (three) times daily. (Patient taking differently: Take 100 mg by mouth at bedtime. ) 60 capsule 0  . hydroxychloroquine (PLAQUENIL) 200 MG tablet Take 200 mg by mouth daily.    . Multiple Vitamin (MULTIVITAMIN WITH MINERALS) TABS tablet Take 1 tablet by mouth every evening.     . Omega-3-6-9 CAPS Take 2 capsules by mouth daily.    Marland Kitchen oxyCODONE-acetaminophen (PERCOCET/ROXICET) 5-325 MG tablet Take 1 tablet by mouth every 6 (six) hours as needed for pain.    . pantoprazole (PROTONIX) 40 MG tablet Take 40 mg by mouth every evening.     . predniSONE (DELTASONE) 5 MG tablet Take 5 mg by mouth daily with breakfast.    . sertraline (ZOLOFT) 25 MG tablet Take 25 mg by mouth daily.    . traZODone (DESYREL) 50 MG tablet Take 50-100 mg by mouth at bedtime as needed for sleep.     Marland Kitchen oxyCODONE (ROXICODONE) 5 MG immediate release tablet Take 0.5-1 tablets (2.5-5 mg total) by mouth every 6 (six) hours as needed for moderate pain or severe pain (0.5 tablets for moderate pain, 1 tablet for severe pain). (Patient not taking: Reported on 11/12/2018) 15 tablet 0    Results for orders placed or performed during the  hospital encounter of 11/24/18 (from the past 48 hour(s))  ABO/Rh     Status: None   Collection Time: 11/24/18 11:13 AM  Result Value Ref Range   ABO/RH(D)      O POS Performed at [facility],  [place] [address], [place], [place] [address]   CBC WITH DIFFERENTIAL     Status: Abnormal   Collection Time: 11/24/18 11:22 AM  Result Value Ref Range   WBC 8.2 4.0 - 10.5 K/uL   RBC 4.48 4.22 - 5.81 MIL/uL   Hemoglobin 12.9 (L) 13.0 - 17.0 g/dL   HCT 40.5 39.0 - 52.0 %   MCV 90.4 80.0 - 100.0 fL   MCH 28.8 26.0 - 34.0 pg   MCHC 31.9 30.0 - 36.0 g/dL   RDW 15.9 (H) 11.5 - 15.5 %   Platelets 199 150 - 400 K/uL   nRBC 0.0 0.0 - 0.2 %   Neutrophils Relative % 59 %   Neutro Abs 5.0 1.7 - 7.7 K/uL   Lymphocytes Relative 26 %   Lymphs Abs 2.1 0.7 - 4.0 K/uL   Monocytes Relative 10 %   Monocytes Absolute 0.8 0.1 - 1.0 K/uL   Eosinophils Relative 3 %   Eosinophils Absolute 0.2 0.0 - 0.5 K/uL   Basophils Relative 1 %   Basophils Absolute 0.0 0.0 - 0.1 K/uL   Immature Granulocytes 1 %   Abs Immature Granulocytes 0.04 0.00 - 0.07 K/uL    Comment: Performed at [facility], [place] [address] [place], [place] [address]  Comprehensive metabolic panel     Status: Abnormal   Collection Time: 11/24/18 11:22 AM  Result Value Ref Range   Sodium 138 135 - 145 mmol/L   Potassium 4.4 3.5 - 5.1 mmol/L   Chloride 106 98 - 111 mmol/L   CO2 23 22 - 32 mmol/L   Glucose, Bld 116 (H) 70 - 99 mg/dL   BUN 16 8 - 23 mg/dL   Creatinine, Ser 1.30 (H) 0.61 - 1.24 mg/dL   Calcium 9.6 8.9 - 10.3 mg/dL   Total Protein 6.9 6.5 - 8.1 g/dL   Albumin 4.5 3.5 - 5.0 g/dL   AST 24 15 - 41 U/L   ALT 24 0 - 44 U/L   Alkaline Phosphatase 42 38 - 126 U/L   Total Bilirubin 1.1 0.3 - 1.2 mg/dL   GFR calc non Af Amer 50 (L) >60 mL/min   GFR calc Af Amer 58 (L) >60 mL/min   Anion gap 9 5 - 15    Comment: Performed at [facility], [place] [address] [place], [place] [address]   Protime-INR     Status: None   Collection Time: 11/24/18 11:22 AM  Result Value Ref Range   Prothrombin Time 12.7 11.4 - 15.2 seconds   INR 0.96     Comment: Performed at [facility], [place] [address], [place], [place] [address]  Type and screen Order type and screen if day of surgery is less than 15 days from draw of preadmission visit or order morning of surgery if day of surgery is greater than 6 days from preadmission visit.     Status: None   Collection Time: 11/24/18 11:22 AM  Result Value Ref Range   ABO/RH(D) O POS    Antibody Screen NEG    Sample Expiration 11/29/2018    Extend sample reason      NO TRANSFUSIONS OR PREGNANCY IN THE PAST 3 MONTHS Performed at [facility], [place] [address] [place], [place] [address]   Surgical pcr screen     Status: Abnormal   Collection Time: 11/24/18 11:22 AM  Result Value Ref Range   MRSA, PCR NEGATIVE NEGATIVE   Staphylococcus aureus  POSITIVE (A) NEGATIVE    Comment: (NOTE) The Xpert SA Assay (FDA approved for NASAL specimens in patients 83 years of age and older), is one component of a comprehensive surveillance program. It is not intended to diagnose infection nor to guide or monitor treatment. Performed at [facility], [place] [address], [place], [place] [address]    Dg Lumbar Spine 2-3 Views  Result Date: 11/24/2018 CLINICAL DATA:  85 year old male for preoperative lumbar decompression. Subsequent encounter. EXAM: LUMBAR SPINE - 2-3 VIEW COMPARISON:  10/24/2018 postmyelogram CT lumbar spine. FINDINGS: Rudimentary disc at S1-2 level. Level assignment as per prior postmyelogram CT. 7.7 mm anterior slip L5.  Moderate L5-S1 disc space narrowing. 3 mm anterior slip L4 with moderate to marked narrowing posterior aspect L4-5 disc space. Minimal retrolisthesis L3. L3-4 disc space narrowing greater on left. Minimal retrolisthesis L2 with prominent right-sided disc space narrowing. Prominent  L1-2 disc degeneration with disc space narrowing greater on the right. Vascular calcifications. IMPRESSION: 1. Rudimentary disc at S1-2 level. Level assignment as per prior postmyelogram CT. 2. 7.7 mm anterior slip L5. Moderate L5-S1 disc space narrowing. 3. 3 mm anterior slip L4 with moderate to marked narrowing posterior aspect L4-5 disc space. 4. Minimal retrolisthesis L3. L3-4 disc space narrowing greater on left. 5. Minimal retrolisthesis L2 with prominent right-sided disc space narrowing. 6. Prominent L1-2 disc degeneration with disc space narrowing greater on the right. 7.  Aortic Atherosclerosis (ICD10-I70.0). Electronically Signed   By: Genia Del M.D.   On: 11/24/2018 16:55    Review of Systems  Constitutional: Negative.   HENT: Positive for hearing loss. Negative for congestion, ear discharge, ear pain, nosebleeds, sinus pain, sore throat and tinnitus.   Eyes: Negative.   Respiratory: Positive for shortness of breath. Negative for cough, hemoptysis, sputum production, wheezing and stridor.        SOB with exertion  Cardiovascular: Negative.   Gastrointestinal: Negative.   Genitourinary: Negative.   Musculoskeletal: Positive for back pain, joint pain and myalgias. Negative for falls and neck pain.  Skin: Negative.   Neurological: Negative.   Endo/Heme/Allergies: Negative.   Psychiatric/Behavioral: Negative.     Blood pressure (!) 151/87, pulse 91, temperature 97.7 F (36.5 C), temperature source Oral, height 6' (1.829 m), weight 78 kg, SpO2 98 %. Physical Exam  Constitutional: He is oriented to person, place, and time. He appears well-developed. No distress.  Overweight  HENT:  Head: Normocephalic and atraumatic.  Right Ear: External ear normal.  Left Ear: External ear normal.  Nose: Nose normal.  Mouth/Throat: Oropharynx is clear and moist.  Eyes: Conjunctivae and EOM are normal.  Neck: Normal range of motion. Neck supple.  Cardiovascular: Normal rate, regular rhythm and  intact distal pulses.  Murmur heard.  Systolic murmur is present with a grade of 2/6. Respiratory: Effort normal and breath sounds normal. No respiratory distress. He has no wheezes.  GI: Soft. Bowel sounds are normal. He exhibits no distension. There is no abdominal tenderness.  Musculoskeletal:     Right hip: Normal.     Left hip: He exhibits decreased range of motion and decreased strength.     Right knee: Normal.     Left knee: Normal.     Lumbar back: He exhibits decreased range of motion, pain and spasm.  Neurological: He is alert and oriented to person, place, and time. No sensory deficit.  Decreased strength in the left hip flexors and quads. 5/5 dorsiflexion.   Skin: No rash noted. He is  not diaphoretic. No erythema.  Psychiatric: He has a normal mood and affect. His behavior is normal.     Assessment/Plan Lumbar spinal stenosis L2-L3, L3-L4 He needs a central decompression at L2-L3, L3-L4. Risks and benefits of the procedure were discussed with the patient by Dr. Gladstone Lighter. Cardiac clearance [facility], Jory Sims, DNP  Gautam Langhorst Thomasenia Sales, PA-C 11/26/2018, 9:03 AM

## 2018-11-26 NOTE — Progress Notes (Signed)
CRITICAL VALUE ALERT  Critical Value: Troponin 0.03  Date & Time Notied:  11/26/18 at 1917  Provider Notified: Tylene Fantasia, paged through [facility] at 1919.   Orders Received/Actions taken:

## 2018-11-27 ENCOUNTER — Encounter (HOSPITAL_COMMUNITY): Payer: Self-pay | Admitting: Orthopedic Surgery

## 2018-11-27 DIAGNOSIS — R310 Gross hematuria: Secondary | ICD-10-CM

## 2018-11-27 DIAGNOSIS — R000 Tachycardia, unspecified: Secondary | ICD-10-CM

## 2018-11-27 DIAGNOSIS — G9341 Metabolic encephalopathy: Secondary | ICD-10-CM

## 2018-11-27 LAB — CBC
HCT: 29.5 % — ABNORMAL LOW (ref 39.0–52.0)
Hemoglobin: 9.2 g/dL — ABNORMAL LOW (ref 13.0–17.0)
MCH: 28.7 pg (ref 26.0–34.0)
MCHC: 31.2 g/dL (ref 30.0–36.0)
MCV: 91.9 fL (ref 80.0–100.0)
PLATELETS: 134 10*3/uL — AB (ref 150–400)
RBC: 3.21 MIL/uL — ABNORMAL LOW (ref 4.22–5.81)
RDW: 15.8 % — ABNORMAL HIGH (ref 11.5–15.5)
WBC: 8.8 10*3/uL (ref 4.0–10.5)
nRBC: 0 % (ref 0.0–0.2)

## 2018-11-27 LAB — URINE CULTURE: Culture: NO GROWTH

## 2018-11-27 LAB — TROPONIN I: Troponin I: 0.03 ng/mL (ref <0.03)

## 2018-11-27 MED ORDER — CEPHALEXIN 500 MG PO CAPS
500.0000 mg | ORAL_CAPSULE | Freq: Two times a day (BID) | ORAL | Status: DC
  Administered 2018-11-27 – 2018-12-03 (×13): 500 mg via ORAL
  Filled 2018-11-27 (×13): qty 1

## 2018-11-27 NOTE — Progress Notes (Signed)
PROGRESS NOTE  Derrick [name] WUJ:[id number] DOB: [birth year] DOA: 11/26/2018 PCP: [name], MD  Brief History   85 year old man PMH including CKD stage III, rheumatoid arthritis on prednisone Plaquenil who was admitted for elective decompressive laminectomy lumbar spine 2/19.  Postoperatively he was tachycardic in the hospital service was asked for comment.  A & P  Narrow complex tachycardia.  Appears to be sinus with frequent PACs, also consider wandering atrial pacemaker, MAT.  Concur with documentation as per Dr. Bonner Puna and his discussion with Dr. Tamala Julian yesterday.  His abnormalities also been seen on previous EKG.  Thought secondary to adrenergic drive postoperatively. --Reviewed telemetry and EKG.  Appears to be sinus rhythm with frequent PACs, ectopy.  Potassium and magnesium are within normal limits. --Recommend watching at this point, can treat symptomatically but patient appears to have normal right now without scheduled rate control agent. --No indication for anticoagulation --Troponin minimally elevated consistent with tachycardia, postoperative state, troponins are flat there is no evidence of ACS, no further evaluation suggested  Nonobstructive coronary artery disease, cleared by cardiology for surgery last month --No evidence of ACS.  Start aspirin and Plavix as soon as possible for surgery, but no gross hematuria below  Gross hematuria with history of BPH, prostate cancer, renal cell carcinoma and Foley catheterization. --being managed by primary service. Follow hemoglobin, check CBC in a.m., consider urology consultation if does not improve  --Urine culture no growth  Acute encephalopathy, likely postoperative in nature.  Patient follows simple commands.  Neurologic exam grossly nonfocal.  Spinal stenosis status post decompressive laminectomy, foraminotomy 2/19 --Per orthopedics  Rheumatoid arthritis    Will see again 2/21  DVT prophylaxis: per orthopedics Code  Status: Full Family Communication: none Disposition Plan: per orthopedics    [name], MD  [facility] Direct contact: see [email or web address]  7PM-7AM contact night coverage as above 11/27/2018, 4:00 PM  LOS: 0 days   Consultants    Procedures  .   Antibiotics  .   Interval History/Subjective  Feels fine, no complaints at this time.  No pain or shortness of breath.  Objective   Vitals:  Vitals:   11/27/18 0420 11/27/18 1353  BP: 117/65 107/77  Pulse: (!) 104 88  Resp: 18 16  Temp: 98.3 F (36.8 C) 97.6 F (36.4 C)  SpO2: 98%     Exam:  Constitutional:  . Appears calm and comfortable Eyes:  . pupils and irises appear normal ENMT:  . grossly normal hearing  Respiratory:  . CTA bilaterally, no w/r/r.  . Respiratory effort normal.  Cardiovascular:  . RRR, no m/r/g . No LE extremity edema   Abdomen:  . Soft Musculoskeletal:  . RUE, LUE, RLE, LLE   . strength and tone normal Psychiatric:  . Mental status o Mood, affect appropriate . Confused  I have personally reviewed the following:   Today's Data  . Troponins flat, 0.03 . Hemoglobin down, 9.2, remainder CBC unremarkable  Lab Data  . Reviewed  Micro Data  . Urine culture negative  Imaging  .   Cardiology Data  . EKG sinus rhythm with frequent ectopy  Other Data  .   Scheduled Meds: . cephALEXin  500 mg Oral Q12H  . docusate sodium  100 mg Oral Daily  . finasteride  5 mg Oral QPM  . hydroxychloroquine  200 mg Oral Daily  . pantoprazole  40 mg Oral QPM  . predniSONE  5 mg Oral Q breakfast  . sertraline  25 mg Oral  Daily   Continuous Infusions: . lactated ringers 100 mL/hr at 11/27/18 0445  . methocarbamol (ROBAXIN) IV      Active Problems:   Spinal stenosis, lumbar region with neurogenic claudication   Tachycardia   Gross hematuria   Acute metabolic encephalopathy   LOS: 0 days

## 2018-11-27 NOTE — Progress Notes (Signed)
CRITICAL VALUE ALERT  Critical Value:  Troponin 0.03  Date & Time Notied: 11/27/18@0703   Provider Notified: yes  Orders Received/Actions taken:

## 2018-11-27 NOTE — Progress Notes (Signed)
Subjective: 1 Day Post-Op Procedure(s) (LRB): Lumbar central decompression L2-3, L3-4 (N/A) Patient reports pain as mild.   Patient seen in rounds for Dr. Gladstone Lighter. Patient is having problems with palpitations and confusion. He was sent to telemetry and medicine consulted. Denies chest pain and SOB. Mild low back pain. Unable to do PT yesterday because of palpitations.    Objective: Vital signs in last 24 hours: Temp:  [97.7 F (36.5 C)-99 F (37.2 C)] 98.3 F (36.8 C) (02/20 0420) Pulse Rate:  [46-160] 104 (02/20 0420) Resp:  [11-23] 18 (02/20 0420) BP: (105-168)/(55-99) 117/65 (02/20 0420) SpO2:  [96 %-100 %] 98 % (02/20 0420)  Intake/Output from previous day:  Intake/Output Summary (Last 24 hours) at 11/27/2018 0819 Last data filed at 11/27/2018 3976 Gross per 24 hour  Intake 3351.08 ml  Output 1500 ml  Net 1851.08 ml     Labs: Recent Labs    11/24/18 1122 11/26/18 1834 11/27/18 0603  HGB 12.9* 10.6* 9.2*   Recent Labs    11/26/18 1834 11/27/18 0603  WBC 10.1 8.8  RBC 3.75* 3.21*  HCT 34.6* 29.5*  PLT 173 134*   Recent Labs    11/24/18 1122 11/26/18 1834  NA 138 135  K 4.4 4.1  CL 106 105  CO2 23 23  BUN 16 12  CREATININE 1.30* 1.08  GLUCOSE 116* 194*  CALCIUM 9.6 8.8*   Recent Labs    11/24/18 1122  INR 0.96    EXAM General - Patient is Alert and Oriented Extremity - Neurovascular intact Intact pulses distally Dorsiflexion/Plantar flexion intact Compartment soft Dressing/Incision - clean, minimal blood tinged drainage Motor Function - intact, moving foot and toes well on exam.  Past Medical History:  Diagnosis Date  . Anemia   . Anxiety   . Arthritis, rheumatoid ([place])   . BPH (benign prostatic hyperplasia)   . CAD (coronary artery disease)   . CKD (chronic kidney disease)   . Depression   . Diverticulosis   . Elevated PSA   . Gangrene ([place])    of toe right foot  . GERD (gastroesophageal reflux disease)   . Herpes simplex  labialis   . High cholesterol   . History of colonic polyps   . Hypertension   . Insomnia   . Lacunar infarction ([place])   . Memory loss   . Obesity   . Panic attacks   . Prostate cancer ([place]) YRS AGO  . Renal cell cancer ([place])    s/p nephrectomy in 2000  . Seropositive rheumatoid arthritis ([place])   . Spermatocele   . Tinnitus   . Tremor of both hands     Assessment/Plan: 1 Day Post-Op Procedure(s) (LRB): Lumbar central decompression L2-3, L3-4 (N/A) Active Problems:   Spinal stenosis, lumbar region with neurogenic claudication  Estimated body mass index is 23.33 kg/m as calculated from the following:   Height as of this encounter: 6' (1.829 m).   Weight as of this encounter: 78 kg. Advance diet Up with therapy as tolerated Continue foley due to urinary output monitoring   Resume Aspirin and Plavix post op per cardiology Weight-Bearing as tolerated  Continue to monitor today. Will hold DC today. Per patient's PCP and urologist, start Keflex. UA normal except hematuria. Keep foley for output monitoring. Remain on telemetry at this time pending medicine and cardiology assessment this morning. Troponin was elevated. Per RN EKG has been more stable this morning. Up with therapy as tolerated. Will limit pain medications as much  as possible to avoid confusion.  Ardeen Jourdain, PA-C Orthopaedic Surgery 11/27/2018, 8:19 AM

## 2018-11-27 NOTE — Progress Notes (Signed)
Physical Therapy Treatment Patient Details Name: Derrick [name] MRN: [id number] DOB: 18-Nov-1931 Today's Date: 11/27/2018    History of Present Illness 85 yo male s/p lumbar decompression L2-L3, L3-L4 on 11/26/18. PMH includs anemia, anxiety, CAD, CKD, depression, diverticulosis, gangrene, HTN, lacunar infarct, memory loss, obesity, prostate cancer, renal cancer, RA, R transmet amputation, nephrectomy.     PT Comments    Pt with continued poor recall and application of back precautions. Pt required mod-max assist +2 for mobility this session, with improved tolerance for activity and improved HR response to exercise per telemetry monitor. Pt continues to present with significant confusion, LE weakness L>R. PT continuing to recommend SNF placement for pt safety. Will continue to follow acutely.    Follow Up Recommendations  SNF;Supervision/Assistance - 24 hour     Equipment Recommendations  None recommended by PT    Recommendations for Other Services       Precautions / Restrictions Precautions Precautions: Fall;Back Precaution Booklet Issued: Yes (comment) Precaution Comments: PT reinforced precautions with mobility, and PT wrote precautions on white board. pt shows lack of carryover into mobility and requires multimodal cues to maintain precautions.  Restrictions Weight Bearing Restrictions: No    Mobility  Bed Mobility Overal bed mobility: Needs Assistance Bed Mobility: Rolling;Sidelying to Sit;Sit to Supine Rolling: Max assist Sidelying to sit: Mod assist;HOB elevated   Sit to supine: Max assist;HOB elevated   General bed mobility comments: Pt required max assist to roll due to lack of understanding of verbal and tactile cuing, required mod assist for sidelying to sit for trunk elevation and simultaneous LE lowering. pt sat EOB for ~5 minutes, LE exercises performed in sitting. Pt max assist for sit to supine, difficulty maintaining back precautions.   Transfers Overall  transfer level: Needs assistance Equipment used: Rolling walker (2 wheeled) Transfers: Sit to/from Stand Sit to Stand: Mod assist;+2 physical assistance;+2 safety/equipment;From elevated surface         General transfer comment: Mod assist +2 for power up, steadying. Sit to stands x7 for LE strengthening and balance intervention.  Ambulation/Gait Ambulation/Gait assistance: Mod assist;+2 physical assistance;+2 safety/equipment Gait Distance (Feet): 2 Feet Assistive device: Rolling walker (2 wheeled) Gait Pattern/deviations: Step-to pattern;Decreased stance time - left;Decreased weight shift to left;Antalgic Gait velocity: decr    General Gait Details: mod assist +2 for steadying, directing RW, LLE knee extension facilitation. Pt with significant LLE buckling, so pt took 2 steps back with max knee extension facilitation to bed.    Stairs             Wheelchair Mobility    Modified Rankin (Stroke Patients Only)       Balance Overall balance assessment: Needs assistance Sitting-balance support: No upper extremity supported;Feet supported Sitting balance-Leahy Scale: Fair Sitting balance - Comments: able to sit EOB and perform AAROM LE exercises   Standing balance support: Bilateral upper extremity supported Standing balance-Leahy Scale: Poor Standing balance comment: unable to stand without PT +2 assist                            Cognition Arousal/Alertness: Awake/alert Behavior During Therapy: WFL for tasks assessed/performed Overall Cognitive Status: Impaired/Different from baseline Area of Impairment: Memory;Orientation;Following commands;Safety/judgement;Problem solving                 Orientation Level: Disoriented to;Place;Time;Situation   Memory: Decreased short-term memory;Decreased recall of precautions Following Commands: Follows one step commands inconsistently;Follows one step commands with increased time  Safety/Judgement: Decreased  awareness of deficits;Decreased awareness of safety   Problem Solving: Difficulty sequencing;Requires verbal cues;Slow processing;Requires tactile cues General Comments: Pt states his name, but cannot say why he is here or where he is; pt reoriented. Pt with decreased short term memory, as PT told pt he had lumbar decompressive surgery and 10 seconds later pt unable to state.       Exercises Other Exercises Other Exercises: Sit to stand x7, for LE strengthening and balance intervention Other Exercises: Assisted LAQ x5, bilaterally, limited by fatigue     General Comments General comments (skin integrity, edema, etc.): HR up to 110 bpm during session       Pertinent Vitals/Pain Pain Assessment: Faces Pain Score: 8  Faces Pain Scale: Hurts whole lot Pain Location: back  Pain Descriptors / Indicators: Sore Pain Intervention(s): Limited activity within patient's tolerance;Monitored during session;Repositioned    Home Living Family/patient expects to be discharged to:: Private residence Living Arrangements: Spouse/significant other Available Help at Discharge: Family;Available 24 hours/day(wife and son ) Type of Home: House Home Access: Stairs to enter Entrance Stairs-Rails: Right;Left;Can reach both Home Layout: One level Home Equipment: Environmental consultant - 4 wheels;Bedside commode;Cane - single point;Wheelchair - manual;Shower seat      Prior Function Level of Independence: Needs assistance  Gait / Transfers Assistance Needed: Pt able to transfer with assist of wife and son, but per family pt was being pushed around the house on the seat of a rollator because he could not walk. Pt was carried in and out of house with wheelchair  ADL's / Homemaking Assistance Needed: Pt states he can do his own dressing/bathing, but wife and son at bedside say they at times needed to help him      PT Goals (current goals can now be found in the care plan section) Acute Rehab PT Goals Patient Stated Goal:  none stated  PT Goal Formulation: With patient Time For Goal Achievement: 12/10/18 Potential to Achieve Goals: Good Progress towards PT goals: Progressing toward goals    Frequency    Min 5X/week      PT Plan Current plan remains appropriate    Co-evaluation              AM-PAC PT "6 Clicks" Mobility   Outcome Measure  Help needed turning from your back to your side while in a flat bed without using bedrails?: A Lot Help needed moving from lying on your back to sitting on the side of a flat bed without using bedrails?: A Lot Help needed moving to and from a bed to a chair (including a wheelchair)?: A Lot Help needed standing up from a chair using your arms (e.g., wheelchair or bedside chair)?: A Lot Help needed to walk in hospital room?: Total Help needed climbing 3-5 steps with a railing? : Total 6 Click Score: 10    End of Session Equipment Utilized During Treatment: Gait belt Activity Tolerance: Patient limited by pain;Patient limited by fatigue Patient left: in bed;with bed alarm set;with call bell/phone within reach;with SCD's reapplied Nurse Communication: Mobility status PT Visit Diagnosis: Muscle weakness (generalized) (M62.81);Other abnormalities of gait and mobility (R26.89)     Time: 1610-9604 PT Time Calculation (min) (ACUTE ONLY): 18 min  Charges:  $Therapeutic Exercise: 8-22 mins                     Derrick [name], PT Acute Rehabilitation Services Pager [phone]  Office [phone]    Derrick [name] 11/27/2018,  4:55 PM

## 2018-11-27 NOTE — Evaluation (Signed)
Occupational Therapy Evaluation Patient Details Name: Derrick [name] MRN: [id number] DOB: Jan 22, 1932 Today's Date: 11/27/2018    History of Present Illness 85 yo male s/p lumbar decompression L2-L3, L3-L4 on 11/26/18. PMH includs anemia, anxiety, CAD, CKD, depression, diverticulosis, gangrene, HTN, lacunar infarct, memory loss, obesity, prostate cancer, renal cancer, RA, R transmet amputation, nephrectomy.    Clinical Impression   Patient is s/p back surgery resulting in the deficits listed below (see OT Problem List).  Patient will benefit from skilled OT to increase their safety and independence with ADL and functional mobility for ADL (while adhering to their precautions) to facilitate discharge to venue listed below.      Follow Up Recommendations  SNF    Equipment Recommendations  None recommended by OT    Recommendations for Other Services       Precautions / Restrictions Precautions Precautions: Fall;Back Precaution Booklet Issued: Yes (comment) Precaution Comments: reviewed with pt and family at bedside, pt shows lack of carryover into mobility and requires multimodal cues to maintain precautions.  Restrictions Weight Bearing Restrictions: No      Mobility Bed Mobility Overal bed mobility: Needs Assistance Bed Mobility: Rolling Rolling: Max assist         General bed mobility comments: pt in significant pain with bed mobility.  Transfers               NT            ADL either performed or assessed with clinical judgement   ADL Overall ADL's : Needs assistance/impaired Eating/Feeding: Moderate assistance;Bed level   Grooming: Moderate assistance;Bed level                                       Vision Patient Visual Report: No change from baseline              Pertinent Vitals/Pain Pain Score: 8  Faces Pain Scale: Hurts whole lot Pain Location: back, penis from catheter Pain Descriptors / Indicators: Sore Pain  Intervention(s): Limited activity within patient's tolerance;Repositioned;Monitored during session     Hand Dominance Right   Extremity/Trunk Assessment Upper Extremity Assessment Upper Extremity Assessment: Generalized weakness           Communication Communication Communication: No difficulties   Cognition Arousal/Alertness: Awake/alert Behavior During Therapy: Anxious Overall Cognitive Status: Impaired/Different from baseline Area of Impairment: Memory;Orientation;Following commands;Safety/judgement;Problem solving                 Orientation Level: Person;Disoriented to;Place   Memory: Decreased short-term memory;Decreased recall of precautions Following Commands: Follows one step commands inconsistently;Follows one step commands with increased time     Problem Solving: Difficulty sequencing;Requires verbal cues;Slow processing;Requires tactile cues General Comments: Pt unable to recall birthday, and thought he was home. Pt reoriented to self and location. Pt with difficulty maintaining precautions without near-constant reinforcement. Pt perseverating on "needing to go to the bathroom, take me to the bathroom" and pt informed over and over again that he had a catheter.    General Comments               Home Living Family/patient expects to be discharged to:: Private residence Living Arrangements: Spouse/significant other Available Help at Discharge: Family;Available 24 hours/day(wife and son ) Type of Home: House Home Access: Stairs to enter [employer] of Steps: 2 Entrance Stairs-Rails: Right;Left;Can reach both Home Layout: One level  Bathroom Shower/Tub: Occupational psychologist: Handicapped height     Home Equipment: Environmental consultant - 4 wheels;Bedside commode;Cane - single point;Wheelchair - manual;Shower seat          Prior Functioning/Environment Level of Independence: Needs assistance  Gait / Transfers Assistance Needed: Pt  able to transfer with assist of wife and son, but per family pt was being pushed around the house on the seat of a rollator because he could not walk. Pt was carried in and out of house with wheelchair  ADL's / Homemaking Assistance Needed: Pt states he can do his own dressing/bathing, but wife and son at bedside say they at times needed to help him             OT Problem List: Decreased knowledge of precautions;Decreased safety awareness;Impaired balance (sitting and/or standing);Decreased strength;Decreased activity tolerance;Decreased knowledge of use of DME or AE;Pain;Decreased cognition      OT Treatment/Interventions: Self-care/ADL training;Patient/family education;Therapeutic activities;DME and/or AE instruction;Therapeutic exercise    OT Goals(Current goals can be found in the care plan section) Acute Rehab OT Goals Patient Stated Goal: goto rehab OT Goal Formulation: With patient/family Time For Goal Achievement: 12/04/18  OT Frequency: Min 2X/week   Barriers to D/C: Decreased caregiver support             AM-PAC OT "6 Clicks" Daily Activity     Outcome Measure Help from another person eating meals?: A Lot Help from another person taking care of personal grooming?: A Lot Help from another person toileting, which includes using toliet, bedpan, or urinal?: Total Help from another person bathing (including washing, rinsing, drying)?: Total Help from another person to put on and taking off regular upper body clothing?: A Lot Help from another person to put on and taking off regular lower body clothing?: Total 6 Click Score: 9   End of Session Nurse Communication: Mobility status  Activity Tolerance: Patient tolerated treatment well Patient left: in bed;with call bell/phone within reach  OT Visit Diagnosis: Pain;Other abnormalities of gait and mobility (R26.89);Muscle weakness (generalized) (M62.81)                Time: 6440-3474 OT Time Calculation (min): 14  min Charges:  OT General Charges $OT Visit: 1 Visit OT Evaluation $OT Eval Moderate Complexity: 1 Mod  [name], [place] [phone] Office- [place], Edwena Felty D 11/27/2018, 1:39 PM

## 2018-11-27 NOTE — Progress Notes (Signed)
Subjective: 1 Day Post-Op Procedure(s) (LRB): Lumbar central decompression L2-3, L3-4 (N/A) Patient reports pain as 1 on 0-10 scale.Doing better but his main problem is confusion. Continues to have hematuria. I spoke with DR. Pharr and DR. Colondoaro.He will have his bladder evaluated when [facility]. His motor strength is now NORMAL in his lowers and his vitals are stable. We are waiting on his Urine culture results. We are covering him with Keflex as instructed. We will hold his Plavix until we see some improvement in his Blood in his urine.I did call his wife and his son.    Objective: Vital signs in last 24 hours: Temp:  [98.2 F (36.8 C)-99 F (37.2 C)] 98.3 F (36.8 C) (02/20 0420) Pulse Rate:  [46-160] 104 (02/20 0420) Resp:  [13-23] 18 (02/20 0420) BP: (105-146)/(55-97) 117/65 (02/20 0420) SpO2:  [96 %-100 %] 98 % (02/20 0420)  Intake/Output from previous day: 02/19 0701 - 02/20 0700 In: 3351.1 [P.O.:240; I.V.:2911.1; IV Piggyback:200] Out: 1500 [Urine:1300; Blood:200] Intake/Output this shift: Total I/O In: 240 [P.O.:240] Out: -   Recent Labs    11/26/18 1834 11/27/18 0603  HGB 10.6* 9.2*   Recent Labs    11/26/18 1834 11/27/18 0603  WBC 10.1 8.8  RBC 3.75* 3.21*  HCT 34.6* 29.5*  PLT 173 134*   Recent Labs    11/26/18 1834  NA 135  K 4.1  CL 105  CO2 23  BUN 12  CREATININE 1.08  GLUCOSE 194*  CALCIUM 8.8*   No results for input(s): LABPT, INR in the last 72 hours.  Dorsiflexion/Plantar flexion intact   Assessment/Plan: 1 Day Post-Op Procedure(s) (LRB): Lumbar central decompression L2-3, L3-4 (N/A) Up with therapy,when stable.  Case discussed with his family.   [name] 11/27/2018, 12:50 PM

## 2018-11-28 DIAGNOSIS — D62 Acute posthemorrhagic anemia: Secondary | ICD-10-CM

## 2018-11-28 DIAGNOSIS — D696 Thrombocytopenia, unspecified: Secondary | ICD-10-CM

## 2018-11-28 LAB — CBC
HCT: 27.8 % — ABNORMAL LOW (ref 39.0–52.0)
Hemoglobin: 8.6 g/dL — ABNORMAL LOW (ref 13.0–17.0)
MCH: 28.8 pg (ref 26.0–34.0)
MCHC: 30.9 g/dL (ref 30.0–36.0)
MCV: 93 fL (ref 80.0–100.0)
Platelets: 120 10*3/uL — ABNORMAL LOW (ref 150–400)
RBC: 2.99 MIL/uL — ABNORMAL LOW (ref 4.22–5.81)
RDW: 16 % — ABNORMAL HIGH (ref 11.5–15.5)
WBC: 7.5 10*3/uL (ref 4.0–10.5)
nRBC: 0 % (ref 0.0–0.2)

## 2018-11-28 NOTE — Progress Notes (Signed)
PROGRESS NOTE  Derrick [name] NTI:[id number] DOB: 1932/08/05 DOA: 11/26/2018 PCP: [name], MD  Brief History   85 year old man PMH including CKD stage III, rheumatoid arthritis on prednisone Plaquenil who was admitted for elective decompressive laminectomy lumbar spine 2/19.  Postoperatively he was tachycardic in the hospital service was asked for comment.  A & P  Narrow complex tachycardia.  Appears to be sinus with frequent PACs, also consider wandering atrial pacemaker, MAT.  Concur with documentation as per Dr. Bonner Puna and his discussion with Dr. Tamala Julian on initial consultation.  His abnormalities also been seen on previous EKG.  Thought secondary to adrenergic drive postoperatively. --Telemetry again today appears to be sinus rhythm with frequent ectopy, PACs --Recommend supportive care, the patient is asymptomatic there is no need to treat this rhythm.  There is no indication for anticoagulation. --Troponin was minimally minimally elevated consistent with tachycardia, postoperative state, troponins are flat there is no evidence of ACS, no further evaluation suggested.  Nonobstructive coronary artery disease, cleared by cardiology for surgery last month --No evidence of ACS.  Recommend start aspirin and Plavix as soon as possible for surgery, but note gross hematuria below  Gross hematuria with history of BPH, prostate cancer, renal cell carcinoma and Foley catheterization. --This condition being managed by primary service who per nurse has discussed the case with urology.  Acute blood loss anemia, suspect postoperative in nature, appears to be stabilizing --Check CBC in a.m., management per primary service.  Wonder what degree gross hematuria is playing.  Thrombocytopenia, mild, likely secondary to postoperative blood loss. --Follow clinically.  Check CBC in a.m.  Acute encephalopathy, likely postoperative in nature, possibly related to anesthesia.  Wife at bedside reports that the  patient has no significant memory problems at home.  Patient again follows simple commands but does appear to be somewhat confused. --I suspect this postoperative delirium will improve as the patient is transitioned home.  There is no evidence of infection or acute neurologic deficits.  Supportive care recommended.  Agree with minimizing sedatives and narcotics.  Spinal stenosis status post decompressive laminectomy, foraminotomy 2/19 --Continue management per orthopedics  Rheumatoid arthritis    Will stop telemetry.  Check CBC in a.m., follow-up anemia and thrombocytopenia.  Other management as per orthopedics.  Will follow-up 2/22.  DVT prophylaxis: per orthopedics Code Status: Full Family Communication: none Disposition Plan: per orthopedics    [name], MD  [facility] Direct contact: see [email or web address]  7PM-7AM contact night coverage as above 11/28/2018, 3:53 PM  LOS: 0 days   Consultants    Procedures  .   Antibiotics  .   Interval History/Subjective  Confused overnight and today.  Wife and sister in law at bedside. Patient pulled out Foley catheter this morning, no apparent injury as the balloon had ruptured. Per RN continues to have gross hematuria. Patient denies complaints.  Objective   Vitals:  Vitals:   11/28/18 1316 11/28/18 1523  BP: (!) 152/83   Pulse: (!) 113 98  Resp: 20   Temp: 98.4 F (36.9 C)   SpO2: 100%     Exam:  Constitutional:   . Appears confused, mostly calm, appears comfortable. Eyes:  . pupils and irises appear normal ENMT:  . grossly normal hearing  Respiratory:  . CTA bilaterally, no w/r/r.  . Respiratory effort normal.  Cardiovascular:  . RRR, no m/r/g . No LE extremity edema   Abdomen:  . Soft Musculoskeletal:  . RUE, LUE, RLE, LLE   . strength and  tone grossly normal Psychiatric:  . Mental status o Mood, affect appropriate . Somewhat confused   I have personally reviewed the following:    Today's Data  . Hemoglobin slightly lower today at 8.6.  Lab Data  . Reviewed  Micro Data  . Urine culture negative  Imaging  .   Cardiology Data  . EKG sinus rhythm with frequent ectopy  Other Data  .   Scheduled Meds: . cephALEXin  500 mg Oral Q12H  . docusate sodium  100 mg Oral Daily  . finasteride  5 mg Oral QPM  . hydroxychloroquine  200 mg Oral Daily  . pantoprazole  40 mg Oral QPM  . predniSONE  5 mg Oral Q breakfast  . sertraline  25 mg Oral Daily   Continuous Infusions: . lactated ringers 100 mL/hr at 11/28/18 1013  . methocarbamol (ROBAXIN) IV      Active Problems:   Thrombocytopenia ([place])   Spinal stenosis, lumbar region with neurogenic claudication   Tachycardia   Gross hematuria   Acute metabolic encephalopathy   Acute blood loss anemia   LOS: 0 days

## 2018-11-28 NOTE — Progress Notes (Signed)
Patient became agitated and combative with staff, attempting to kick, punch, and bite while performing ADLs, pulled out foley, unable to get pt to follow commands.Order obtained to replace [facility] and for soft wrist restraints

## 2018-11-28 NOTE — Progress Notes (Signed)
PT Cancellation Note  Patient Details Name: Derrick [name] MRN: [id number] DOB: Dec 03, 1931   Cancelled Treatment:    Reason Eval/Treat Not Completed: Other (comment)(RN defers PT today, pt agitated and in restraints at this time. Pt pulled foley catheter this am and was quite agitated per RN. Will check back as schedule allows.)  [name], PT Acute Rehabilitation Services Pager [phone]  Office [phone]  Roxine Caddy D Elonda Husky 11/28/2018, 1:42 PM

## 2018-11-28 NOTE — Plan of Care (Signed)
  Problem: Activity: Goal: Will remain free from falls Outcome: Progressing   Problem: Clinical Measurements: Goal: Ability to maintain clinical measurements within normal limits will improve Outcome: Progressing Goal: Postoperative complications will be avoided or minimized Outcome: Progressing Goal: Diagnostic test results will improve Outcome: Progressing   Problem: Pain Management: Goal: Pain level will decrease Outcome: Progressing   Problem: Bladder/Genitourinary: Goal: Urinary functional status for postoperative course will improve Outcome: Progressing   Problem: Safety: Goal: Ability to remain free from injury will improve Outcome: Progressing

## 2018-11-28 NOTE — Progress Notes (Signed)
Pt's family stated that they wanted Foley be removed because they felt it was creating distress on the patient and they feel he will calm down once it is removed

## 2018-11-28 NOTE — Progress Notes (Signed)
Subjective: 2 Days Post-Op Procedure(s) (LRB): Lumbar central decompression L2-3, L3-4 (N/A) Patient reports pain as 1 on 0-10 scale.He remains confused but knows he and his wife's names and that he is in [place].His urine remains dark red and his urine culture shows no growth. We are holding his Plavix. He is able to move his lowers and his left foot dorsiflexion is much better post surgery. Main issue is his confusion. Will hold all narcotics.HBg is 8.6.  Will follow.I did discusss the case with Dr. Samson Frederic.  Objective: Vital signs in last 24 hours: Temp:  [97.6 F (36.4 C)-99 F (37.2 C)] 99 F (37.2 C) (02/21 0530) Pulse Rate:  [51-105] 105 (02/21 0530) Resp:  [16-20] 20 (02/20 2211) BP: (107-153)/(77-94) 153/88 (02/21 0530) SpO2:  [92 %] 92 % (02/20 2211)  Intake/Output from previous day: 02/20 0701 - 02/21 0700 In: 2534.9 [P.O.:720; I.V.:1814.9] Out: 950 [Urine:950] Intake/Output this shift: No intake/output data recorded.  Recent Labs    11/26/18 1834 11/27/18 0603 11/28/18 0605  HGB 10.6* 9.2* 8.6*   Recent Labs    11/27/18 0603 11/28/18 0605  WBC 8.8 7.5  RBC 3.21* 2.99*  HCT 29.5* 27.8*  PLT 134* 120*   Recent Labs    11/26/18 1834  NA 135  K 4.1  CL 105  CO2 23  BUN 12  CREATININE 1.08  GLUCOSE 194*  CALCIUM 8.8*   No results for input(s): LABPT, INR in the last 72 hours.  Dorsiflexion/Plantar flexion intact   Assessment/Plan: 2 Days Post-Op Procedure(s) (LRB): Lumbar central decompression L2-3, L3-4 (N/A) Up with therapy      [name] 11/28/2018, 7:16 AM

## 2018-11-29 ENCOUNTER — Observation Stay (HOSPITAL_COMMUNITY): Payer: Medicare Other

## 2018-11-29 DIAGNOSIS — I10 Essential (primary) hypertension: Secondary | ICD-10-CM | POA: Diagnosis not present

## 2018-11-29 DIAGNOSIS — D6959 Other secondary thrombocytopenia: Secondary | ICD-10-CM | POA: Diagnosis not present

## 2018-11-29 DIAGNOSIS — I2510 Atherosclerotic heart disease of native coronary artery without angina pectoris: Secondary | ICD-10-CM | POA: Diagnosis present

## 2018-11-29 DIAGNOSIS — M4326 Fusion of spine, lumbar region: Secondary | ICD-10-CM | POA: Diagnosis not present

## 2018-11-29 DIAGNOSIS — D649 Anemia, unspecified: Secondary | ICD-10-CM | POA: Diagnosis not present

## 2018-11-29 DIAGNOSIS — I129 Hypertensive chronic kidney disease with stage 1 through stage 4 chronic kidney disease, or unspecified chronic kidney disease: Secondary | ICD-10-CM | POA: Diagnosis present

## 2018-11-29 DIAGNOSIS — R000 Tachycardia, unspecified: Secondary | ICD-10-CM | POA: Diagnosis not present

## 2018-11-29 DIAGNOSIS — M6281 Muscle weakness (generalized): Secondary | ICD-10-CM | POA: Diagnosis not present

## 2018-11-29 DIAGNOSIS — M48062 Spinal stenosis, lumbar region with neurogenic claudication: Secondary | ICD-10-CM | POA: Diagnosis present

## 2018-11-29 DIAGNOSIS — Z4789 Encounter for other orthopedic aftercare: Secondary | ICD-10-CM | POA: Diagnosis not present

## 2018-11-29 DIAGNOSIS — N189 Chronic kidney disease, unspecified: Secondary | ICD-10-CM | POA: Diagnosis not present

## 2018-11-29 DIAGNOSIS — G9341 Metabolic encephalopathy: Secondary | ICD-10-CM

## 2018-11-29 DIAGNOSIS — G4700 Insomnia, unspecified: Secondary | ICD-10-CM | POA: Diagnosis present

## 2018-11-29 DIAGNOSIS — R2689 Other abnormalities of gait and mobility: Secondary | ICD-10-CM | POA: Diagnosis not present

## 2018-11-29 DIAGNOSIS — F05 Delirium due to known physiological condition: Secondary | ICD-10-CM | POA: Diagnosis not present

## 2018-11-29 DIAGNOSIS — K5900 Constipation, unspecified: Secondary | ICD-10-CM | POA: Diagnosis not present

## 2018-11-29 DIAGNOSIS — Z7952 Long term (current) use of systemic steroids: Secondary | ICD-10-CM | POA: Diagnosis not present

## 2018-11-29 DIAGNOSIS — M4807 Spinal stenosis, lumbosacral region: Secondary | ICD-10-CM | POA: Diagnosis not present

## 2018-11-29 DIAGNOSIS — Z7401 Bed confinement status: Secondary | ICD-10-CM | POA: Diagnosis not present

## 2018-11-29 DIAGNOSIS — R32 Unspecified urinary incontinence: Secondary | ICD-10-CM | POA: Diagnosis not present

## 2018-11-29 DIAGNOSIS — R52 Pain, unspecified: Secondary | ICD-10-CM | POA: Diagnosis not present

## 2018-11-29 DIAGNOSIS — Z87891 Personal history of nicotine dependence: Secondary | ICD-10-CM | POA: Diagnosis not present

## 2018-11-29 DIAGNOSIS — N183 Chronic kidney disease, stage 3 (moderate): Secondary | ICD-10-CM | POA: Diagnosis present

## 2018-11-29 DIAGNOSIS — M059 Rheumatoid arthritis with rheumatoid factor, unspecified: Secondary | ICD-10-CM | POA: Diagnosis present

## 2018-11-29 DIAGNOSIS — Z85528 Personal history of other malignant neoplasm of kidney: Secondary | ICD-10-CM | POA: Diagnosis not present

## 2018-11-29 DIAGNOSIS — J449 Chronic obstructive pulmonary disease, unspecified: Secondary | ICD-10-CM | POA: Diagnosis present

## 2018-11-29 DIAGNOSIS — K219 Gastro-esophageal reflux disease without esophagitis: Secondary | ICD-10-CM | POA: Diagnosis not present

## 2018-11-29 DIAGNOSIS — R310 Gross hematuria: Secondary | ICD-10-CM | POA: Diagnosis not present

## 2018-11-29 DIAGNOSIS — Z8673 Personal history of transient ischemic attack (TIA), and cerebral infarction without residual deficits: Secondary | ICD-10-CM | POA: Diagnosis not present

## 2018-11-29 DIAGNOSIS — Z86010 Personal history of colonic polyps: Secondary | ICD-10-CM | POA: Diagnosis not present

## 2018-11-29 DIAGNOSIS — F329 Major depressive disorder, single episode, unspecified: Secondary | ICD-10-CM | POA: Diagnosis present

## 2018-11-29 DIAGNOSIS — I6381 Other cerebral infarction due to occlusion or stenosis of small artery: Secondary | ICD-10-CM | POA: Diagnosis not present

## 2018-11-29 DIAGNOSIS — Z8546 Personal history of malignant neoplasm of prostate: Secondary | ICD-10-CM | POA: Diagnosis not present

## 2018-11-29 DIAGNOSIS — N400 Enlarged prostate without lower urinary tract symptoms: Secondary | ICD-10-CM | POA: Diagnosis present

## 2018-11-29 DIAGNOSIS — M069 Rheumatoid arthritis, unspecified: Secondary | ICD-10-CM | POA: Diagnosis not present

## 2018-11-29 DIAGNOSIS — M2550 Pain in unspecified joint: Secondary | ICD-10-CM | POA: Diagnosis not present

## 2018-11-29 DIAGNOSIS — D62 Acute posthemorrhagic anemia: Secondary | ICD-10-CM | POA: Diagnosis not present

## 2018-11-29 DIAGNOSIS — F419 Anxiety disorder, unspecified: Secondary | ICD-10-CM | POA: Diagnosis present

## 2018-11-29 DIAGNOSIS — D696 Thrombocytopenia, unspecified: Secondary | ICD-10-CM | POA: Diagnosis not present

## 2018-11-29 LAB — CBC
HCT: 26.1 % — ABNORMAL LOW (ref 39.0–52.0)
Hemoglobin: 8.3 g/dL — ABNORMAL LOW (ref 13.0–17.0)
MCH: 29 pg (ref 26.0–34.0)
MCHC: 31.8 g/dL (ref 30.0–36.0)
MCV: 91.3 fL (ref 80.0–100.0)
NRBC: 0 % (ref 0.0–0.2)
Platelets: 120 10*3/uL — ABNORMAL LOW (ref 150–400)
RBC: 2.86 MIL/uL — ABNORMAL LOW (ref 4.22–5.81)
RDW: 15.3 % (ref 11.5–15.5)
WBC: 7.8 10*3/uL (ref 4.0–10.5)

## 2018-11-29 LAB — TSH: TSH: 0.566 u[IU]/mL (ref 0.350–4.500)

## 2018-11-29 LAB — AMMONIA: Ammonia: 35 umol/L (ref 9–35)

## 2018-11-29 LAB — VITAMIN B12: VITAMIN B 12: 594 pg/mL (ref 180–914)

## 2018-11-29 NOTE — Progress Notes (Signed)
   Subjective: 3 Days Post-Op Procedure(s) (LRB): Lumbar central decompression L2-3, L3-4 (N/A)  Pt currently awake and alert but confused and remains in soft restraints Report of urinary incontinence all night  Answers questions appropriately Plan for SNF next week Patient reports pain as mild.  Objective:   VITALS:   Vitals:   11/29/18 0008 11/29/18 0646  BP: (!) 159/109 93/61  Pulse: (!) 102 89  Resp: 16 16  Temp: 98.6 F (37 C)   SpO2: 95% 99%    Lumbar incision healing well nv intact distally Good function and rom of bilateral lower extremities  LABS Recent Labs    11/27/18 0603 11/28/18 0605 11/29/18 0517  HGB 9.2* 8.6* 8.3*  HCT 29.5* 27.8* 26.1*  WBC 8.8 7.5 7.8  PLT 134* 120* 120*    Recent Labs    11/26/18 1834  NA 135  K 4.1  BUN 12  CREATININE 1.08  GLUCOSE 194*     Assessment/Plan: 3 Days Post-Op Procedure(s) (LRB): Lumbar central decompression L2-3, L3-4 (N/A) Acute blood loss anemia - will continue to monitor If the blood counts continue to decrease, may need transfusion Recommend foley catheter again to avoid incontinence Case discussed with nurses this am    Merla Riches PA-C, [place] [facility] [employer] [facility] [address], [place], [place], [place] [address] Phone: [phone] [email or web address] Facebook  [employer]

## 2018-11-29 NOTE — Consult Note (Addendum)
NEURO HOSPITALIST CONSULT NOTE   Requestig physician: Dr. Gladstone Lighter   Reason for Consult:confusion/ AMS   History obtained from:  Patient  Wife/patient  HPI:                                                                                                                                          Derrick [name] is an 85 y.o. male  With PMH bilateral hand tremor,RA ( prednisone), kidney cancer ( s/p nephrectomy 2000), prostate cancer, CVA, HTN, HLD, CKD III, CAD, BPH who presented to [facility] on 11/26/2018 for surgery for Lumbar stenosis. Since surgery patient has had AMS. Neurology consulted 11/29/2018 for AMS.   Per chart review patient was on plavix and aspirin 81 mg at home.  Patient was admitted for lumbar central decompression L2-3, L3-4 (N/A). Per wife patient does not have memory problems at home. Patient has been somewhat confused since surgery. Per wife he has been having diffulty walking and he uses a wheel chair or rolling walker to move around for about the past month. He is normally pretty independent and states that he was driving until his left leg would not allow it anymore because he couldn't control it ( states it has been awhile), but stopped driving prior to using the "rolling walker" which started 1 month ago. She also recalls a few episodes where the patient would become angry or agitated at simple request, but then quickly calm down or it was easy to redirect him.  Wife states that he is less confused today.    Hospital course: 2/19 admitted for elective  foraminotomy L3 and L4. 2/21 hospitalist consulted for narrow complex tachycardia 2/22 neurology consulted for continuing AMS  Past Medical History:  Diagnosis Date  . Anemia   . Anxiety   . Arthritis, rheumatoid ([place])   . BPH (benign prostatic hyperplasia)   . CAD (coronary artery disease)   . CKD (chronic kidney disease)   . Depression   . Diverticulosis   . Elevated PSA   . Gangrene ([place])    of toe  right foot  . GERD (gastroesophageal reflux disease)   . Herpes simplex labialis   . High cholesterol   . History of colonic polyps   . Hypertension   . Insomnia   . Lacunar infarction ([place])   . Memory loss   . Obesity   . Panic attacks   . Prostate cancer ([place]) YRS AGO  . Renal cell cancer ([place])    s/p nephrectomy in 2000  . Seropositive rheumatoid arthritis ([place])   . Spermatocele   . Tinnitus   . Tremor of both hands     Past Surgical History:  Procedure Laterality Date  . AMPUTATION TOE Right 06/13/2017   Procedure: AMPUTATION TOE 1st and  2nd;  Surgeon: [name], DPM;  Location: [place];  Service: Podiatry;  Laterality: Right;  . AMPUTATION TOE     RIGHT FOOT ALL TOES GONE  . BACK SURGERY  YRS AGO   LOWER  . COLONOSCOPY    . GRAFT APPLICATION Right 05/15/8109   Procedure: EPIFIX GRAFT APPLICATION OF RIGHT FOOT;  Surgeon: [name], DPM;  Location: [place];  Service: Podiatry;  Laterality: Right;  . I&D EXTREMITY Right 09/13/2017   Procedure: IRRIGATION AND DEBRIDEMENT AND BONE BIOPSY;  Surgeon: [name], DPM;  Location: [place];  Service: Podiatry;  Laterality: Right;  . INCISION AND DRAINAGE OF WOUND Right 12/18/2017   Procedure: IRRIGATION AND DEBRIDEMENT WOUND ON RIGHT FOOT;  Surgeon: [name], DPM;  Location: [place];  Service: Podiatry;  Laterality: Right;  . IR ANGIOGRAM EXTREMITY RIGHT  05/30/2017  . IR ANGIOGRAM EXTREMITY RIGHT  08/08/2017  . IR ANGIOGRAM SELECTIVE EACH ADDITIONAL VESSEL  08/08/2017  . IR ANGIOGRAM SELECTIVE EACH ADDITIONAL VESSEL  08/08/2017  . IR RADIOLOGIST EVAL & MGMT  05/16/2017  . IR TIB-PERO ART PTA MOD SED  05/30/2017  . IR TIB-PERO ART PTA MOD SED  08/08/2017  . IR TIB-PERO ART UNI PTA EA ADD VESSEL MOD SED  05/30/2017  . IR TIB-PERO ART UNI PTA EA ADD VESSEL MOD SED  08/08/2017  . IR US GUIDE VASC ACCESS RIGHT  05/30/2017  . IR US GUIDE VASC ACCESS RIGHT  08/08/2017  . LEFT HEART CATHETERIZATION WITH CORONARY ANGIOGRAM N/A  11/19/2011   Procedure: LEFT HEART CATHETERIZATION WITH CORONARY ANGIOGRAM;  Surgeon: [name], MD;  Location: [facility] CATH LAB;  Service: Cardiovascular;  Laterality: N/A;  . LUMBAR LAMINECTOMY/DECOMPRESSION MICRODISCECTOMY N/A 11/26/2018   Procedure: Lumbar central decompression L2-3, L3-4;  Surgeon: [name], MD;  Location: [facility];  Service: Orthopedics;  Laterality: N/A;  165min  . NEPHRECTOMY  10/1998  . TRANSMETATARSAL AMPUTATION Right 08/14/2017   Procedure: TRANSMETATARSAL AMPUTATION;  Surgeon: [name], DPM;  Location: [place];  Service: Podiatry;  Laterality: Right;    Family History  Problem Relation Age of Onset  . Heart attack Father 65  . Stroke Father   . Kidney failure Sister        Bright's disease   . Cancer Paternal Aunt        d. 30s; NOS cancer of leg  . Kidney disease Sister        d. 52; "Bright's disease" - possible kidney cancer?  . Cancer Sister        NOS cancer; d. 24s; "sick in the hospital"  . Heart attack Son 7  . Leukemia Son 21  . COPD Son   . Breast cancer Daughter 54    Social History:  reports that he quit smoking about 40 years ago. His smoking use included cigarettes. He has a 29.00 pack-year smoking history. He has never used smokeless tobacco. He reports that he does not drink alcohol or use drugs.  No Known Allergies  MEDICATIONS:  Scheduled: . cephALEXin  500 mg Oral Q12H  . docusate sodium  100 mg Oral Daily  . finasteride  5 mg Oral QPM  . hydroxychloroquine  200 mg Oral Daily  . pantoprazole  40 mg Oral QPM  . predniSONE  5 mg Oral Q breakfast  . sertraline  25 mg Oral Daily   Continuous: . lactated ringers 100 mL/hr at 11/29/18 0603  . methocarbamol (ROBAXIN) IV     GUY:[id number] **OR** acetaminophen, bisacodyl, HYDROcodone-acetaminophen, HYDROcodone-acetaminophen, HYDROmorphone (DILAUDID)  injection, menthol-cetylpyridinium **OR** phenol, methocarbamol **OR** methocarbamol (ROBAXIN) IV, metoprolol tartrate, ondansetron **OR** ondansetron (ZOFRAN) IV, polyethylene glycol, sodium phosphate, traZODone   ROS:                                                                                                                                        unobtainable from patient due to mental status   Blood pressure 93/61, pulse 89, temperature 98.6 F (37 C), temperature source Oral, resp. rate 16, height 6' (1.829 m), weight 78 kg, SpO2 99 %.   General Examination:                                                                                                       Physical Exam  HEENT-  Normocephalic, no lesions, without obvious abnormality.  Normal external eye and conjunctiva.   Cardiovascular- S1-S2 audible, pulses palpable throughout   Lungs-no rhonchi or wheezing noted, no excessive working breathing.  Saturations within normal limits on RA Extremities- Warm, dry and intact Musculoskeletal- right foot with toe amputations Skin-warm and dry,   Neurological Examination Mental Status: Asleep, easily aroused with name calling and light touch, oriented to name/ stated it was January, he was 68 and did not know the year. Patient did recognize his wife.Speech fluent without evidence of aphasia.  Able to follow  commands without difficulty. Patient in bilateral soft wrist restraints and hand mittens. Cranial Nerves: II:  Visual fields grossly normal,  III,IV, VI: ptosis not present, extra-ocular motions intact bilaterally pupils equal, round, reactive to light and accommodation V,VII: smile symmetric, facial light touch sensation normal bilaterally VIII: hearing normal bilaterally IX,X: uvula rises symmetrically XI: bilateral shoulder shrug XII: midline tongue extension Motor: Right : Upper extremity   5/5  Left:     Upper extremity   5/5  Lower extremity   5/5   Lower extremity    2/5 Tone and bulk:normal tone throughout; no atrophy noted Sensory:  light touch intact throughout,  bilaterally Deep Tendon Reflexes: 2+biceps, did not elicit knee jerk Plantars: Right: amputation of toes   Left: downgoing Cerebellar: Deferred d/t restraints Gait: deferred   Lab Results: Basic Metabolic Panel: Recent Labs  Lab 11/24/18 1122 11/26/18 1834  NA 138 135  K 4.4 4.1  CL 106 105  CO2 23 23  GLUCOSE 116* 194*  BUN 16 12  CREATININE 1.30* 1.08  CALCIUM 9.6 8.8*  MG  --  2.0    CBC: Recent Labs  Lab 11/24/18 1122 11/26/18 1834 11/27/18 0603 11/28/18 0605 11/29/18 0517  WBC 8.2 10.1 8.8 7.5 7.8  NEUTROABS 5.0  --   --   --   --   HGB 12.9* 10.6* 9.2* 8.6* 8.3*  HCT 40.5 34.6* 29.5* 27.8* 26.1*  MCV 90.4 92.3 91.9 93.0 91.3  PLT 199 173 134* 120* 120*    Cardiac Enzymes: Recent Labs  Lab 11/26/18 1834 11/27/18 0003 11/27/18 0603  TROPONINI 0.03* <0.03 0.03*    Imaging: No results found.  Assessment: 85 year old male With PMH bilateral hand tremor,RA ( prednisone), kidney cancer ( s/p nephrectomy 2000), prostate cancer, CVA, HTN, HLD, CKD III, CAD, BPH who presented to [facility] hospital for elective elective foraminotomy L3 and L4. Neurology consulted for AMS.      Recommendations: -- TSH, ammonia, CMP, B12 -- limit sedating medications  --MRI Brain  Laurey Morale, MSN, NP-C [facility] [phone]  Attending neurologist's note to follow   11/29/2018, 9:37 AM  I have seen the patient and reviewed the above note.  He had a's relatively sudden change after anesthesia and therefore an MRI was obtained earlier today at our recommendation which is limited, though it does answer the major question which I had for it which is that he does not appear to have had an acute stroke.  He also had several labs which are available at the time of finalizing this note, including TSH, ammonia, B12 all of which are normal.  He has apparently been  having some improvement with time.  As is noted elsewhere, he does have memory loss noted in his past medical history, though I do not think this is all that prominent at baseline.  I suspect that this is a hospital related/postoperative delirium that will gradually improve over the next few days to weeks.  I would favor continuing conservative measures, certainly if he is becoming agitated at night, than scheduled Seroquel be a consideration.  5 mg of prednisone I think is unlikely to be contributing given the low-dose.  At this time, other than conservative management and possibly temporary placement if needed, I do not think that there is any other evaluation that I would recommend.  Roland Rack, MD [facility] [phone]  If 7pm- 7am, please page neurology on call as listed in [place].

## 2018-11-29 NOTE — Progress Notes (Signed)
Physical Therapy Treatment Patient Details Name: Derrick [name] MRN: [id number] DOB: 1932-10-07 Today's Date: 11/29/2018    History of Present Illness 85 yo male s/p lumbar decompression L2-L3, L3-L4 on 11/26/18. PMH includs anemia, anxiety, CAD, CKD, depression, diverticulosis, gangrene, HTN, lacunar infarct, memory loss, obesity, prostate cancer, renal cancer, RA, R transmet amputation, nephrectomy.     PT Comments    Pt able to take some side steps in front of the bed today.  He continues to have L LE buckeling, but appears to be a bit better than previous session. He was very fatigued with session, but agreeable.  Con't to recommend SNF.   Follow Up Recommendations  SNF;Supervision/Assistance - 24 hour     Equipment Recommendations  None recommended by PT    Recommendations for Other Services       Precautions / Restrictions Precautions Precautions: Fall;Back Precaution Comments: Reviewed precautions with pt    Mobility  Bed Mobility Overal bed mobility: Needs Assistance Bed Mobility: Rolling;Sidelying to Sit;Sit to Sidelying Rolling: Min assist Sidelying to sit: Mod assist;+2 for safety/equipment     Sit to sidelying: Min assist;+2 for physical assistance General bed mobility comments: He did better with cues today with log rolling and sidelying to sit.  Still required multi-modal cues.  Transfers Overall transfer level: Needs assistance Equipment used: Rolling walker (2 wheeled) Transfers: Sit to/from Stand Sit to Stand: Mod assist;+2 physical assistance;From elevated surface;+2 safety/equipment         General transfer comment: Stood with +2 A with multi-modal cueing for hand placement.  A to steady once standing.  Worked on weight shifting and noted some L LE buckeling, but able to support weight.  After sitting rest break, stood again and did side stepping.  Ambulation/Gait Ambulation/Gait assistance: Mod assist;+2 physical assistance;+2 safety/equipment Gait  Distance (Feet): 3 Feet Assistive device: Rolling walker (2 wheeled) Gait Pattern/deviations: Decreased stance time - left;Decreased weight shift to left Gait velocity: decr    General Gait Details: Side stepping in front of bed with A to move RW and for sequencing.  Some L LE buckeling noted, but able to correct and adjust body.  Pt quite fatigued from sidesteps.   Stairs             Wheelchair Mobility    Modified Rankin (Stroke Patients Only)       Balance   Sitting-balance support: Feet supported;Feet unsupported Sitting balance-Leahy Scale: Fair Sitting balance - Comments: initially sitting without LE on floor, but able to scoot up with cueing to steady self with LE.   Standing balance support: Bilateral upper extremity supported Standing balance-Leahy Scale: Poor Standing balance comment: required UE support and A of 2                            Cognition Arousal/Alertness: Awake/alert Behavior During Therapy: WFL for tasks assessed/performed Overall Cognitive Status: Impaired/Different from baseline Area of Impairment: Memory;Orientation;Safety/judgement;Problem solving                 Orientation Level: Disoriented to;Time;Situation   Memory: Decreased short-term memory;Decreased recall of precautions Following Commands: Follows one step commands consistently Safety/Judgement: Decreased awareness of deficits;Decreased awareness of safety   Problem Solving: Difficulty sequencing;Requires verbal cues;Slow processing;Requires tactile cues General Comments: He knew he was in [name], but thought it was July      Exercises      General Comments        Pertinent  Vitals/Pain Pain Assessment: Faces Faces Pain Scale: Hurts even more Pain Location: back  Pain Descriptors / Indicators: Grimacing Pain Intervention(s): Limited activity within patient's tolerance;Monitored during session;Repositioned    Home Living                       Prior Function            PT Goals (current goals can now be found in the care plan section) Acute Rehab PT Goals Patient Stated Goal: none stated  PT Goal Formulation: With patient Time For Goal Achievement: 12/10/18 Potential to Achieve Goals: Good Progress towards PT goals: Progressing toward goals    Frequency    Min 5X/week      PT Plan Current plan remains appropriate    Co-evaluation              AM-PAC PT "6 Clicks" Mobility   Outcome Measure  Help needed turning from your back to your side while in a flat bed without using bedrails?: A Lot Help needed moving from lying on your back to sitting on the side of a flat bed without using bedrails?: A Lot Help needed moving to and from a bed to a chair (including a wheelchair)?: A Lot Help needed standing up from a chair using your arms (e.g., wheelchair or bedside chair)?: A Lot Help needed to walk in hospital room?: A Lot Help needed climbing 3-5 steps with a railing? : Total 6 Click Score: 11    End of Session Equipment Utilized During Treatment: Gait belt Activity Tolerance: Patient limited by fatigue;Patient limited by pain Patient left: in bed;with call bell/phone within reach;with bed alarm set;with family/visitor present;with restraints reapplied;with SCD's reapplied   PT Visit Diagnosis: Muscle weakness (generalized) (M62.81);Other abnormalities of gait and mobility (R26.89)     Time: 5830-9407 PT Time Calculation (min) (ACUTE ONLY): 19 min  Charges:  $Gait Training: 8-22 mins                     Derrick [name] Derrick [name], [place] Pager [phone] 11/29/2018    Derrick [name] 11/29/2018, 3:37 PM

## 2018-11-29 NOTE — Progress Notes (Signed)
PROGRESS NOTE  Derrick [name] TKZ:[id number] DOB: [birth year] DOA: 11/26/2018 PCP: [name], MD  Brief History   85 year old man PMH including CKD stage III, rheumatoid arthritis on prednisone Plaquenil who was admitted for elective decompressive laminectomy lumbar spine 2/19.  Postoperatively he was tachycardic in the hospital service was asked for comment.  A & P  Narrow complex tachycardia.  Appears to be sinus with frequent PACs, also consider wandering atrial pacemaker, MAT.  Concur with documentation as per Dr. Bonner Puna and his discussion with Dr. Tamala Julian on initial consultation.  His abnormalities also been seen on previous EKG.  Thought secondary to adrenergic drive postoperatively.  Telemetry showed sinus rhythm with frequent ectopy, PACs. Troponin was minimally minimally elevated consistent with tachycardia, postoperative state, troponins are flat there is no evidence of ACS, no further evaluation suggested. --Continue to recommend supportive care, patient is asymptomatic, no need to treat this rhythm.  No indication for anticoagulation.  Nonobstructive coronary artery disease, cleared by cardiology for surgery last month --No evidence of ACS.  Recommend to restart aspirin and Plavix as soon as possible for surgery, but note gross hematuria below  Gross hematuria with history of BPH, prostate cancer, renal cell carcinoma and Foley catheterization. --Seems to be resolving.  Management as per primary service who has been in contact with urology.  Acute blood loss anemia, suspect postoperative in nature, appears to be stabilizing --Hemoglobin seems to be stabilizing.  Defer management to primary service in concert with urology if needed.  Thrombocytopenia, mild, likely secondary to postoperative blood loss. --Seems to be stabilizing at this point.  Likely secondary to postoperative blood loss.  Acute encephalopathy, likely postoperative in nature, possibly related to anesthesia.  No  reported memory issues at home although memory loss was noted in his past medical history. --Suspect postoperative delirium, he has not had any narcotics in the last 24 hours. --Defer management to neurology.  Spinal stenosis status post decompressive laminectomy, foraminotomy 2/19 --Continue management per orthopedics  Rheumatoid arthritis    Heart rhythm seems to be stable.  No active cardiology issues noted at this point.  Defer management of gross hematuria, blood loss anemia to primary service.  I expect thrombocytopenia will resolve once bleeding stops.  Management of encephalopathy per neurology.  I will follow-up again 2/23.  DVT prophylaxis: per orthopedics Code Status: Full Family Communication: none Disposition Plan: per orthopedics    [name], MD  [facility] Direct contact: see [email or web address]  7PM-7AM contact night coverage as above 11/29/2018, 12:02 PM  LOS: 0 days   Consultants    Procedures  .   Antibiotics  .   Interval History/Subjective  Remains confused.  Still has some hematuria. Patient sleeping this morning.  Wife at bedside.  Objective   Vitals:  Vitals:   11/29/18 0008 11/29/18 0646  BP: (!) 159/109 93/61  Pulse: (!) 102 89  Resp: 16 16  Temp: 98.6 F (37 C)   SpO2: 95% 99%    Exam:  Constitutional:   . Appears calm and comfortable  Respiratory . CTA bilaterally, no w/r/r.  . Respiratory effort normal.  Cardiovascular:  . RRR, no m/r/g . No LE extremity edema   Psychiatric:  . Mental status . Currently sleeping   I have personally reviewed the following:   Today's Data  . Hemoglobin without significant change, 8.3.  Platelets stable at 120.  Lab Data  . Reviewed  Micro Data  . Urine culture negative  Imaging  .   Cardiology  Data  . EKG sinus rhythm with frequent ectopy  Other Data  .   Scheduled Meds: . cephALEXin  500 mg Oral Q12H  . docusate sodium  100 mg Oral Daily  . finasteride  5 mg  Oral QPM  . hydroxychloroquine  200 mg Oral Daily  . pantoprazole  40 mg Oral QPM  . predniSONE  5 mg Oral Q breakfast  . sertraline  25 mg Oral Daily   Continuous Infusions: . lactated ringers 100 mL/hr at 11/29/18 0603  . methocarbamol (ROBAXIN) IV      Active Problems:   Thrombocytopenia ([place])   Spinal stenosis, lumbar region with neurogenic claudication   Tachycardia   Gross hematuria   Acute metabolic encephalopathy   Acute blood loss anemia   LOS: 0 days

## 2018-11-30 LAB — CBC
HCT: 26.5 % — ABNORMAL LOW (ref 39.0–52.0)
Hemoglobin: 8.4 g/dL — ABNORMAL LOW (ref 13.0–17.0)
MCH: 29.1 pg (ref 26.0–34.0)
MCHC: 31.7 g/dL (ref 30.0–36.0)
MCV: 91.7 fL (ref 80.0–100.0)
Platelets: 127 10*3/uL — ABNORMAL LOW (ref 150–400)
RBC: 2.89 MIL/uL — AB (ref 4.22–5.81)
RDW: 15.4 % (ref 11.5–15.5)
WBC: 6 10*3/uL (ref 4.0–10.5)
nRBC: 0 % (ref 0.0–0.2)

## 2018-11-30 LAB — COMPREHENSIVE METABOLIC PANEL
ALBUMIN: 2.8 g/dL — AB (ref 3.5–5.0)
ALT: 23 U/L (ref 0–44)
AST: 41 U/L (ref 15–41)
Alkaline Phosphatase: 32 U/L — ABNORMAL LOW (ref 38–126)
Anion gap: 7 (ref 5–15)
BUN: 9 mg/dL (ref 8–23)
CO2: 23 mmol/L (ref 22–32)
CREATININE: 0.78 mg/dL (ref 0.61–1.24)
Calcium: 8.3 mg/dL — ABNORMAL LOW (ref 8.9–10.3)
Chloride: 106 mmol/L (ref 98–111)
GFR calc Af Amer: >60 mL/min (ref >60)
GFR calc non Af Amer: >60 mL/min (ref >60)
Glucose, Bld: 106 mg/dL — ABNORMAL HIGH (ref 70–99)
Potassium: 3.2 mmol/L — ABNORMAL LOW (ref 3.5–5.1)
Sodium: 136 mmol/L (ref 135–145)
Total Bilirubin: 1.1 mg/dL (ref 0.3–1.2)
Total Protein: 5 g/dL — ABNORMAL LOW (ref 6.5–8.1)

## 2018-11-30 MED ORDER — POTASSIUM CHLORIDE CRYS ER 20 MEQ PO TBCR
40.0000 meq | EXTENDED_RELEASE_TABLET | Freq: Once | ORAL | Status: AC
  Administered 2018-11-30: 40 meq via ORAL
  Filled 2018-11-30: qty 2

## 2018-11-30 NOTE — Progress Notes (Signed)
Subjective: 4 Days Post-Op Procedure(s) (LRB): Lumbar central decompression L2-3, L3-4 (N/A) Patient reports pain as 1 on 0-10 scale. Doing much better. He remains confused but is improving. Neurologist feel that this is not a stroke situation and that his confusion will clear. MRI of his brain is negative for a stroke. His motor function in his lowers is fine.His HBg is stable and his urine is clearing. Case discussed with DR. Borden,Urologist.. I will call his family and inform them of probable SNF .   Objective: Vital signs in last 24 hours: Temp:  [98.2 F (36.8 C)-99.1 F (37.3 C)] 98.2 F (36.8 C) (02/23 0557) Pulse Rate:  [50-84] 55 (02/23 0557) Resp:  [16-18] 18 (02/23 0557) BP: (115-139)/(59-90) 120/60 (02/23 0557) SpO2:  [94 %-97 %] 97 % (02/23 0557)  Intake/Output from previous day: 02/22 0701 - 02/23 0700 In: 2613.7 [P.O.:600; I.V.:2013.7] Out: 1650 [Urine:1650] Intake/Output this shift: No intake/output data recorded.  Recent Labs    11/28/18 0605 11/29/18 0517 11/30/18 0636  HGB 8.6* 8.3* 8.4*   Recent Labs    11/29/18 0517 11/30/18 0636  WBC 7.8 6.0  RBC 2.86* 2.89*  HCT 26.1* 26.5*  PLT 120* 127*   Recent Labs    11/30/18 0636  NA 136  K 3.2*  CL 106  CO2 23  BUN 9  CREATININE 0.78  GLUCOSE 106*  CALCIUM 8.3*   No results for input(s): LABPT, INR in the last 72 hours.  Dorsiflexion/Plantar flexion intact No cellulitis present   Assessment/Plan: 4 Days Post-Op Procedure(s) (LRB): Lumbar central decompression L2-3, L3-4 (N/A) Up with therapy I will have Social worker evaluate for SNF.      [name] 11/30/2018, 9:09 AM

## 2018-11-30 NOTE — Clinical Social Work Note (Signed)
Clinical Social Work Assessment  Patient Details  Name: Derrick [name] MRN: [id number] Date of Birth: 1931-11-14  Date of referral:  11/30/18               Reason for consult:  Facility Placement                Permission sought to share information with:  Family Supports Permission granted to share information::  Yes, Verbal Permission Granted  Name::     wife wanda  Agency::     Relationship::     Contact Information:     Housing/Transportation Living arrangements for the past 2 months:  Single Family Home Source of Information:  Spouse Patient Interpreter Needed:  None Criminal Activity/Legal Involvement Pertinent to Current Situation/Hospitalization:  No - Comment as needed Significant Relationships:  Spouse, Adult Children Lives with:  Spouse Do you feel safe going back to the place where you live?  Yes Need for family participation in patient care:  Yes (Comment)(wife)  Care giving concerns:  Pt admitted from home where he resides with his wife- at baseline is fairly independent with walker ambulating per wife. Has memory loss history but none that is significant daily issue per wife. Has surgery for lumbar decompression on 2/19, wife reports since then he became confused. Neurology following, likely hospital delirium. Also being treated for tachycardia.   Social Worker assessment / plan:  CSW consulted to assist pt with SNF placement. Discussed with wife via phone- pt having care done and also not oriented entirely to situation. Wife reported care needs above and is in agreement to pursue snf for short term rehab as she feels his current level of functioning is not baseline. She would like him stronger prior to returning home with her. Made referrals and will follow up with bed offers. Explained [facility] Medicare prior approval requirement to admit to SNF- this can be initiated once facility known. Pasrr went to manual review- will follow up with additional documents they request in  order to complete Pas[facility].  Employment status:  Retired Engineer, maintenance ([employer])) PT Recommendations:  [place] / Referral to community resources:  [place]  Patient/Family's Response to care:  Wife appreciative  Patient/Family's Understanding of and Emotional Response to Diagnosis, Current Treatment, and Prognosis:  Wife showed good understanding of the procedure/treatment pt has received. Admitted to being somewhat distracted at time of conversation and thus did not discuss at length. Emotionally stable.   Emotional Assessment Appearance:  Appears stated age Attitude/Demeanor/Rapport:  (having care done) Affect (typically observed):  Calm Orientation:  Oriented to Self, Oriented to Place Alcohol / Substance use:  Not Applicable Psych involvement (Current and /or in the community):  No (Comment)  Discharge Needs  Concerns to be addressed:  Decision making concerns, Discharge Planning Concerns Readmission within the last 30 days:  No Current discharge risk:  Cognitively Impaired, Dependent with Mobility Barriers to Discharge:  Continued Medical Work up, Ship broker, Programmer, applications ([place])   Nila Nephew, LCSW 11/30/2018, 1:14 PM [phone] coverage for [phone]

## 2018-11-30 NOTE — Progress Notes (Addendum)
NEURO HOSPITALIST PROGRESS NOTE   Subjective: Patient in bed, awake, alert, NAD. Wife at bedside. No restraints present today.  Exam: Vitals:   11/29/18 2104 11/30/18 0557  BP:  120/60  Pulse:  (!) 55  Resp:  18  Temp: 98.3 F (36.8 C) 98.2 F (36.8 C)  SpO2:  97%    Physical Exam   HEENT-  Normocephalic, no lesions, without obvious abnormality.  Normal external eye and conjunctiva.   Cardiovascular- S1-S2 audible, pulses palpable throughout   Lungs-no rhonchi or wheezing noted, no excessive working breathing.  Saturations within normal limits on RA Extremities- Warm, dry and intact Musculoskeletal-no joint tenderness, deformity or swelling Skin-warm and dry,intact   Neuro:   Mental Status:  Awake, alert, oriented to name. Stated he was 45, year 2004 and month was December. Still some confusion present.Speech fluent without evidence of aphasia.  Able to follow  commands without difficulty. Cranial Nerves: II:  Visual fields grossly normal,  III,IV, VI: ptosis not present, extra-ocular motions intact bilaterally pupils equal, round, reactive to light and accommodation V,VII: smile symmetric, facial light touch sensation normal bilaterally VIII: hearing normal bilaterally IX,X: uvula rises symmetrically XI: bilateral shoulder shrug XII: midline tongue extension Motor: Right :  Upper extremity   5/5              Left:     Upper extremity   5/5             Lower extremity   5/5                          Lower extremity   2/5 Tone and bulk:normal tone throughout; no atrophy noted Sensory:  light touch intact throughout, bilaterally Deep Tendon Reflexes: 2+biceps, did not elicit knee jerk Plantars: Right: amputation of toes                                Left: downgoing Cerebellar: FNF intact. Gait: deferred    Medications:  Scheduled: . cephALEXin  500 mg Oral Q12H  . docusate sodium  100 mg Oral Daily  . finasteride  5 mg Oral QPM  .  hydroxychloroquine  200 mg Oral Daily  . pantoprazole  40 mg Oral QPM  . predniSONE  5 mg Oral Q breakfast  . sertraline  25 mg Oral Daily   Continuous: . lactated ringers 100 mL/hr at 11/30/18 0400  . methocarbamol (ROBAXIN) IV     OEU:[id number] **OR** acetaminophen, bisacodyl, HYDROcodone-acetaminophen, HYDROcodone-acetaminophen, HYDROmorphone (DILAUDID) injection, menthol-cetylpyridinium **OR** phenol, methocarbamol **OR** methocarbamol (ROBAXIN) IV, metoprolol tartrate, ondansetron **OR** ondansetron (ZOFRAN) IV, polyethylene glycol, sodium phosphate, traZODone  Pertinent Labs/Diagnostics:  TSH: WNL Ammonia: WNL B12: WNL  Mr Brain Wo Contrast  Result Date: 11/29/2018 CLINICAL DATA:  Assess for TIA. History of hypertension, hypercholesterolemia, renal cell carcinoma and stroke. EXAM: MRI HEAD WITHOUT CONTRAST TECHNIQUE: Sagittal T1 and axial diffusion weighted imaging of the head. Patient could not tolerate further imaging and removed himself from scanner. COMPARISON:  CT HEAD April 29, 2018 FINDINGS: Severely motion degraded sequences. Brain: No reduced diffusion to suggest large vascular territory infarct though sequences motion degraded. No midline shift or mass effect. No hydrocephalus. Vascular: Nondiagnostic. Skull and upper cervical spine: Nondiagnostic. Sinuses/Orbits: Nondiagnostic. Other: None. IMPRESSION: 1. Limited severely motion degraded  2 sequence MRI head without acute intracranial process. Electronically Signed   By: Elon Alas M.D.   On: 11/29/2018 14:27   Assessment:  85 year old male With PMH bilateral hand tremor,RA ( prednisone), kidney cancer ( s/p nephrectomy 2000), prostate cancer, CVA, HTN, HLD, CKD III, CAD, BPH who presented to [facility] hospital for elective elective foraminotomy L3 and L4. Neurology consulted for AMS.  AMS labs WNL. MRI: w/o acute abnormality. Likely hospital induced delirium. Continue conservative measures.    Recommendations:  -  continue to limit sedating medications - continue conservative measures; see consult note - I suspect that patient will continue to gradually improve with time, he made need placement for sometime. Neurology will sign off at this time, but is available for questions or concerns.   Laurey Morale, MSN, NP-C [facility] [phone]   11/30/2018, 11:33 AM

## 2018-11-30 NOTE — Progress Notes (Signed)
PROGRESS NOTE  Derrick [name] SPQ:[id number] DOB: 17-Jul-1932 DOA: 11/26/2018 PCP: [name], MD  Brief History   85 year old man PMH including CKD stage III, rheumatoid arthritis on prednisone Plaquenil who was admitted for elective decompressive laminectomy lumbar spine 2/19.  Postoperatively he was tachycardic in the hospital service was asked for comment.  A & P  Benign narrow complex tachycardia.  Appears to be sinus with frequent PACs, also consider wandering atrial pacemaker, MAT.  Concur with documentation as per Dr. Bonner Puna and his discussion with Dr. Tamala Julian on initial consultation.  His abnormalities also been seen on previous EKG.  Thought secondary to adrenergic drive postoperatively.  Telemetry showed sinus rhythm with frequent ectopy, PACs. Troponin was minimally minimally elevated consistent with tachycardia, postoperative state, troponins are flat there is no evidence of ACS, no further evaluation suggested. --Continue supportive care.  No indication for anticoagulation.    Nonobstructive coronary artery disease, cleared by cardiology for surgery last month --Stable.  Restart aspirin and Plavix when able, defer to surgery  Gross hematuria with history of BPH, prostate cancer, renal cell carcinoma and Foley catheterization. --Appears to be resolved at this point.  Defer management to primary service who is been in discussion with urology.  Acute blood loss anemia, suspect postoperative in nature, appears to be stabilizing --Hemoglobin stable  Thrombocytopenia, mild, likely secondary to postoperative blood loss. --Trending back up, likely secondary to acute blood loss, no further evaluation of suspected  Acute encephalopathy, likely postoperative in nature, possibly related to anesthesia.  No reported memory issues at home although memory loss was noted in his past medical history. --Suspect postoperative delirium.  Laboratory studies unremarkable, imaging no evidence of stroke.   Recommendations as per neurology.  Spinal stenosis status post decompressive laminectomy, foraminotomy 2/19 --Continue management per orthopedics  Rheumatoid arthritis   Seems to be slowly improving.  Should be able to go to SNF soon.  I expect delirium will slowly improve over time I will sign off at this point.  Please call if I can be of further assistance.   DVT prophylaxis: per orthopedics Code Status: Full Family Communication: none Disposition Plan: per orthopedics    [name], MD  [facility] Direct contact: see [email or web address]  7PM-7AM contact night coverage as above 11/30/2018, 11:58 AM  LOS: 1 day   Consultants    Procedures  .   Antibiotics  .   Interval History/Subjective  Seems to be doing better today, less confused.  Urine has cleared.  Objective   Vitals:  Vitals:   11/29/18 2104 11/30/18 0557  BP:  120/60  Pulse:  (!) 55  Resp:  18  Temp: 98.3 F (36.8 C) 98.2 F (36.8 C)  SpO2:  97%    Exam:  Constitutional:   . Appears calm and comfortable Respiratory:  . CTA bilaterally, no w/r/r.  . Respiratory effort normal.  Cardiovascular:  . RRR, no m/r/g . No LE extremity edema   Psychiatric:  . Mental status o Mood, affect appropriate . Slightly confused   I have personally reviewed the following:   Today's Data  . Potassium 3.2, remainder CMP unremarkable. . Hemoglobin stable at 8.4.  Platelets trending up, 127 . MRI brain was limited but no evidence of stroke . TSH, B12, ammonia within normal limits  Lab Data  . Reviewed  Micro Data  . Urine culture negative  Imaging  .   Cardiology Data  . EKG sinus rhythm with frequent ectopy  Other Data  .  Scheduled Meds: . cephALEXin  500 mg Oral Q12H  . docusate sodium  100 mg Oral Daily  . finasteride  5 mg Oral QPM  . hydroxychloroquine  200 mg Oral Daily  . pantoprazole  40 mg Oral QPM  . predniSONE  5 mg Oral Q breakfast  . sertraline  25 mg Oral Daily    Continuous Infusions: . lactated ringers 100 mL/hr at 11/30/18 0400  . methocarbamol (ROBAXIN) IV      Active Problems:   Thrombocytopenia ([place])   Spinal stenosis, lumbar region with neurogenic claudication   Tachycardia   Gross hematuria   Acute metabolic encephalopathy   Acute blood loss anemia   LOS: 1 day

## 2018-11-30 NOTE — NC FL2 (Signed)
[place] LEVEL OF CARE SCREENING TOOL     IDENTIFICATION  Patient Name: Derrick [name] Birthdate: Aug 05, 1932 Sex: male Admission Date (Current Location): 11/26/2018  [facility] and [place] Number:  Herbalist and Address:  [facility],  [place] [address] [place]      Provider Number: [id number]  Attending Physician Name and Address:  [name], MD  Relative Name and Phone Number:       Current Level of Care: Hospital Recommended Level of Care: [place] Prior Approval Number:    Date Approved/Denied:   PASRR Number: pending  Discharge Plan: SNF    Current Diagnoses: Patient Active Problem List   Diagnosis Date Noted  . Acute blood loss anemia 11/28/2018  . Tachycardia 11/27/2018  . Gross hematuria 11/27/2018  . Acute metabolic encephalopathy 51/88/4166  . Spinal stenosis, lumbar region with neurogenic claudication 11/26/2018  . Ulcer of right foot limited to breakdown of skin ([place])   . PAD (peripheral artery disease) ([place]) 09/23/2017  . Syncope and collapse 09/19/2017  . Osteomyelitis ([place])   . Malaise 09/09/2017  . Anorexia 09/09/2017  . Hypotension 09/09/2017  . Volume depletion 09/09/2017  . AKI (acute kidney injury) ([place]) 09/09/2017  . Hyponatremia 06/12/2017  . COPD (chronic obstructive pulmonary disease) ([place])   . Lacunar infarction ([place])   . Gangrene of toe of right foot ([place])   . Pre-syncope 11/21/2016  . Dyspnea on exertion 03/03/2016  . H/O unilateral Rt nephrectomy 03/03/2016  . Asymmetrical right sensorineural hearing loss 02/24/2016  . Bilateral hearing loss 02/24/2016  . Bilateral impacted cerumen 02/24/2016  . Arrhythmia 11/09/2015  . CAD (coronary artery disease) 11/09/2015  . Abnormal ECG 10/22/2013  . Dyspnea 10/22/2013  . Chest pain at rest 11/18/2011  . Unstable angina ([place]) 11/18/2011  . Thrombocytopenia ([place]) 11/18/2011  . Anemia 11/18/2011  . CKD (chronic kidney  disease), stage III ([place]) 11/18/2011  . Hypertension   . High cholesterol   . GERD (gastroesophageal reflux disease)   . BPH (benign prostatic hyperplasia)     Orientation RESPIRATION BLADDER Height & Weight     Self, Place  Normal Incontinent, Indwelling catheter Weight: 172 lb (78 kg) Height:  6' (182.9 cm)  BEHAVIORAL SYMPTOMS/MOOD NEUROLOGICAL BOWEL NUTRITION STATUS      Continent Diet(low sodium heart healthy diet)  AMBULATORY STATUS COMMUNICATION OF NEEDS Skin   Extensive Assist Verbally Surgical wounds(closed incision back)                       Personal Care Assistance Level of Assistance  Bathing, Feeding, Dressing Bathing Assistance: Maximum assistance Feeding assistance: Independent Dressing Assistance: Maximum assistance     Functional Limitations Info  Sight, Hearing, Speech Sight Info: Adequate Hearing Info: Adequate Speech Info: Adequate    SPECIAL CARE FACTORS FREQUENCY  PT (By licensed PT), OT (By licensed OT)     PT Frequency: 5x OT Frequency: 5x            Contractures Contractures Info: Not present    Additional Factors Info  Code Status, Allergies Code Status Info: full code Allergies Info: nka           Current Medications (11/30/2018):  This is the current hospital active medication list Current Facility-Administered Medications  Medication Dose Route Frequency Provider Last Rate Last Dose  . acetaminophen (TYLENOL) tablet 650 mg  650 mg Oral Q4H PRN [name], MD   650 mg at 11/29/18  1859   Or  . acetaminophen (TYLENOL) suppository 650 mg  650 mg Rectal Q4H PRN [name], MD      . bisacodyl (DULCOLAX) EC tablet 5 mg  5 mg Oral Daily PRN [name], MD      . cephALEXin (KEFLEX) capsule 500 mg  500 mg Oral Q12H Constable, Amber, PA-C   500 mg at 11/30/18 1050  . docusate sodium (COLACE) capsule 100 mg  100 mg Oral Daily [name], MD   100 mg at 11/30/18 1050  . finasteride (PROSCAR) tablet 5 mg  5 mg  Oral QPM [name], MD   5 mg at 11/29/18 1859  . HYDROcodone-acetaminophen (NORCO) 10-325 MG per tablet 2 tablet  2 tablet Oral Q4H PRN [name], MD   2 tablet at 11/27/18 1732  . HYDROcodone-acetaminophen (NORCO/VICODIN) 5-325 MG per tablet 1 tablet  1 tablet Oral Q4H PRN [name], MD   1 tablet at 11/27/18 2232  . HYDROmorphone (DILAUDID) injection 0.5 mg  0.5 mg Intravenous Q2H PRN [name], MD      . hydroxychloroquine (PLAQUENIL) tablet 200 mg  200 mg Oral Daily Vance Gather B, MD   200 mg at 11/30/18 1050  . lactated ringers infusion   Intravenous Continuous [name], MD 100 mL/hr at 11/30/18 0400    . menthol-cetylpyridinium (CEPACOL) lozenge 3 mg  1 lozenge Oral PRN [name], MD       Or  . phenol (CHLORASEPTIC) mouth spray 1 spray  1 spray Mouth/Throat PRN [name], MD      . methocarbamol (ROBAXIN) tablet 500 mg  500 mg Oral Q6H PRN [name], MD   500 mg at 11/28/18 0810   Or  . methocarbamol (ROBAXIN) 500 mg in dextrose 5 % 50 mL IVPB  500 mg Intravenous Q6H PRN [name], MD      . ondansetron (ZOFRAN) tablet 4 mg  4 mg Oral Q6H PRN [name], MD       Or  . ondansetron (ZOFRAN) injection 4 mg  4 mg Intravenous Q6H PRN [name], MD      . pantoprazole (PROTONIX) EC tablet 40 mg  40 mg Oral QPM [name], MD   40 mg at 11/29/18 1859  . polyethylene glycol (MIRALAX / GLYCOLAX) packet 17 g  17 g Oral Daily PRN [name], MD      . predniSONE (DELTASONE) tablet 5 mg  5 mg Oral Q breakfast Patrecia Pour, MD   5 mg at 11/30/18 0817  . sertraline (ZOLOFT) tablet 25 mg  25 mg Oral Daily [name], MD   25 mg at 11/30/18 1050  . sodium phosphate (FLEET) 7-19 GM/118ML enema 1 enema  1 enema Rectal Once PRN [name], MD      . traZODone (DESYREL) tablet 50-100 mg  50-100 mg Oral QHS PRN [name], MD   50 mg at 11/29/18 2120     Discharge Medications: Please see discharge summary for a  list of discharge medications.  Relevant Imaging Results:  Relevant Lab Results:   Additional Information SS# [id number]  Nila Nephew, LCSW

## 2018-12-01 LAB — CBC
HCT: 25 % — ABNORMAL LOW (ref 39.0–52.0)
Hemoglobin: 7.9 g/dL — ABNORMAL LOW (ref 13.0–17.0)
MCH: 28.9 pg (ref 26.0–34.0)
MCHC: 31.6 g/dL (ref 30.0–36.0)
MCV: 91.6 fL (ref 80.0–100.0)
Platelets: 150 10*3/uL (ref 150–400)
RBC: 2.73 MIL/uL — ABNORMAL LOW (ref 4.22–5.81)
RDW: 15.2 % (ref 11.5–15.5)
WBC: 4.4 10*3/uL (ref 4.0–10.5)
nRBC: 0 % (ref 0.0–0.2)

## 2018-12-01 LAB — COMPREHENSIVE METABOLIC PANEL
ALBUMIN: 2.7 g/dL — AB (ref 3.5–5.0)
ALT: 25 U/L (ref 0–44)
AST: 36 U/L (ref 15–41)
Alkaline Phosphatase: 30 U/L — ABNORMAL LOW (ref 38–126)
Anion gap: 6 (ref 5–15)
BUN: 7 mg/dL — ABNORMAL LOW (ref 8–23)
CO2: 24 mmol/L (ref 22–32)
Calcium: 8.2 mg/dL — ABNORMAL LOW (ref 8.9–10.3)
Chloride: 107 mmol/L (ref 98–111)
Creatinine, Ser: 0.84 mg/dL (ref 0.61–1.24)
GFR calc Af Amer: >60 mL/min (ref >60)
GFR calc non Af Amer: >60 mL/min (ref >60)
GLUCOSE: 105 mg/dL — AB (ref 70–99)
Potassium: 3.1 mmol/L — ABNORMAL LOW (ref 3.5–5.1)
Sodium: 137 mmol/L (ref 135–145)
Total Bilirubin: 0.9 mg/dL (ref 0.3–1.2)
Total Protein: 5 g/dL — ABNORMAL LOW (ref 6.5–8.1)

## 2018-12-01 LAB — HEMOGLOBIN AND HEMATOCRIT, BLOOD
HCT: 23.9 % — ABNORMAL LOW (ref 39.0–52.0)
Hemoglobin: 7.5 g/dL — ABNORMAL LOW (ref 13.0–17.0)

## 2018-12-01 LAB — PREPARE RBC (CROSSMATCH)

## 2018-12-01 MED ORDER — SODIUM CHLORIDE 0.9% IV SOLUTION: Freq: Once | INTRAVENOUS | Status: DC

## 2018-12-01 MED ORDER — CLOPIDOGREL BISULFATE 75 MG PO TABS
75.0000 mg | ORAL_TABLET | Freq: Every day | ORAL | Status: DC
  Administered 2018-12-01 – 2018-12-03 (×3): 75 mg via ORAL
  Filled 2018-12-01 (×3): qty 1

## 2018-12-01 MED ORDER — POTASSIUM CHLORIDE CRYS ER 20 MEQ PO TBCR
40.0000 meq | EXTENDED_RELEASE_TABLET | Freq: Every day | ORAL | Status: DC
  Administered 2018-12-01 – 2018-12-03 (×3): 40 meq via ORAL
  Filled 2018-12-01 (×3): qty 2

## 2018-12-01 MED ORDER — ASPIRIN EC 81 MG PO TBEC
81.0000 mg | DELAYED_RELEASE_TABLET | Freq: Every day | ORAL | Status: DC
  Administered 2018-12-01 – 2018-12-03 (×3): 81 mg via ORAL
  Filled 2018-12-01 (×3): qty 1

## 2018-12-01 NOTE — Progress Notes (Signed)
Dr. Gladstone Lighter called and gave verbal order to [facility], prepare and transfuse 2 units of PRBCs and place order for H&H after 2 units.  RN notified that he would need to complete the attestation.  Dr. Gladstone Lighter stated that he would call and speak with patient's wife and place order.

## 2018-12-01 NOTE — Progress Notes (Signed)
Occupational Therapy Treatment Patient Details Name: Derrick [name] MRN: [id number] DOB: Apr 19, 1932 Today's Date: 12/01/2018    History of present illness 85 yo male s/p lumbar decompression L2-L3, L3-L4 on 11/26/18. PMH includs anemia, anxiety, CAD, CKD, depression, diverticulosis, gangrene, HTN, lacunar infarct, memory loss, obesity, prostate cancer, renal cancer, RA, R transmet amputation, nephrectomy.    OT comments  Pt much clearer this day and with increased participation  Follow Up Recommendations  SNF    Equipment Recommendations  None recommended by OT    Recommendations for Other Services      Precautions / Restrictions Precautions Precautions: Fall;Back Precaution Comments: Reviewed precautions with pt       Mobility Bed Mobility Overal bed mobility: Needs Assistance Bed Mobility: Rolling;Sidelying to Sit Rolling: Min assist Sidelying to sit: Mod assist       General bed mobility comments: required multi-modal cues.  Transfers Overall transfer level: Needs assistance Equipment used: Rolling walker (2 wheeled) Transfers: Sit to/from Stand Sit to Stand: Mod assist;From elevated surface   Squat pivot transfers: Max assist     General transfer comment: L knee buckled with transfer. Pt did have awarenss of this    Balance Overall balance assessment: History of Falls;Needs assistance Sitting-balance support: Feet supported Sitting balance-Leahy Scale: Fair     Standing balance support: Bilateral upper extremity supported Standing balance-Leahy Scale: Poor                             ADL either performed or assessed with clinical judgement   ADL Overall ADL's : Needs assistance/impaired     Grooming: Set up;Sitting                   Toilet Transfer: Stand-pivot;Cueing for safety;Cueing for sequencing;RW;Maximal assistance Toilet Transfer Details (indicate cue type and reason): bed to chair. L knee buckles.  Encouraged pt to really  rely on BUE.  Needed significant amount of A Toileting- Clothing Manipulation and Hygiene: Cueing for safety;Cueing for sequencing;Cueing for back precautions;Maximal assistance;Sit to/from stand;Adhering to back precautions         General ADL Comments: Pt LLE very weak and did buckle with transfer.  Educated CNA as well as Therapist, sports .       Vision Patient Visual Report: No change from baseline            Cognition Arousal/Alertness: Awake/alert Behavior During Therapy: WFL for tasks assessed/performed Overall Cognitive Status: Impaired/Different from baseline                   Orientation Level: Situation   Memory: Decreased recall of precautions Following Commands: Follows one step commands consistently Safety/Judgement: Decreased awareness of deficits;Decreased awareness of safety   Problem Solving: Requires verbal cues;Requires tactile cues                     Pertinent Vitals/ Pain       Pain Assessment: Faces Faces Pain Scale: Hurts a little bit Pain Location: back  Pain Descriptors / Indicators: Discomfort Pain Intervention(s): Limited activity within patient's tolerance;Repositioned;Monitored during session         Frequency  Min 2X/week        Progress Toward Goals  OT Goals(current goals can now be found in the care plan section)  Progress towards OT goals: Progressing toward goals     Plan Discharge plan remains appropriate       AM-PAC OT "6 Clicks"  Daily Activity     Outcome Measure   Help from another person eating meals?: A Little Help from another person taking care of personal grooming?: A Little Help from another person toileting, which includes using toliet, bedpan, or urinal?: Total Help from another person bathing (including washing, rinsing, drying)?: Total Help from another person to put on and taking off regular upper body clothing?: A Little Help from another person to put on and taking off regular lower body clothing?:  Total 6 Click Score: 12    End of Session    OT Visit Diagnosis: Pain;Other abnormalities of gait and mobility (R26.89);Muscle weakness (generalized) (M62.81)   Activity Tolerance Patient tolerated treatment well   Patient Left in bed;with call bell/phone within reach   Nurse Communication Mobility status        Time: 1120-1140 OT Time Calculation (min): 20 min  Charges: OT General Charges $OT Visit: 1 Visit OT Treatments $Self Care/Home Management : 8-22 mins  [name], [place] [phone] Office- [phone], Edwena Felty D 12/01/2018, 12:28 PM

## 2018-12-01 NOTE — Progress Notes (Signed)
Physical Therapy Treatment Patient Details Name: Derrick [name] MRN: [id number] DOB: Feb 27, 1932 Today's Date: 12/01/2018    History of Present Illness 85 yo male s/p lumbar decompression L2-L3, L3-L4 on 11/26/18. PMH includs anemia, anxiety, CAD, CKD, depression, diverticulosis, gangrene, HTN, lacunar infarct, memory loss, obesity, prostate cancer, renal cancer, RA, R transmet amputation, nephrectomy.     PT Comments    Pt able to ambulate hallway distance today with one seated rest break today. Pt with continued lack of recall of precautions and confusion, but with improved command following and affect today. PT continuing to recommend SNF placement. PT to continue to follow acutely.    Follow Up Recommendations  SNF;Supervision/Assistance - 24 hour     Equipment Recommendations  None recommended by PT    Recommendations for Other Services       Precautions / Restrictions Precautions Precautions: Fall;Back Precaution Comments: Reviewed precautions with pt, mostly bending and twisting precautions with mobility as pt with tendency towards forward flexed spinal posture Restrictions Weight Bearing Restrictions: No    Mobility  Bed Mobility Overal bed mobility: Needs Assistance Bed Mobility: Sit to Sidelying;Rolling Rolling: Min assist Sidelying to sit: Mod assist     Sit to sidelying: Mod assist General bed mobility comments: Pt up in chair upon PT arrival. Requires mod assist for sit to sidelying for coordination of lowering onto L forearm and lifting LEs into bed. Spinal precautions reinforced throughout.   Transfers Overall transfer level: Needs assistance Equipment used: Rolling walker (2 wheeled) Transfers: Sit to/from Stand Sit to Stand: Mod assist   Squat pivot transfers: Max assist     General transfer comment: Mod assist for power up, steadying, maintaining neutral spine with standing and sitting. Pt with sit to stand x3 during session, once from chair to start  ambulation, after seated rest break with ambulation, and once to get back to bed from chair after ambulation.   Ambulation/Gait Ambulation/Gait assistance: Min assist;+2 safety/equipment(pt's son with chair follow ) Gait Distance (Feet): 37 Feet(1x12 ft, 1x25 ft ) Assistive device: Rolling walker (2 wheeled) Gait Pattern/deviations: Step-through pattern;Decreased stride length;Decreased stance time - left;Decreased weight shift to left;Trunk flexed Gait velocity: decr    General Gait Details: Min assist for steadying and LLE guarding initially, as pt with tendency toward mild LLE buckling. After first 5 ft or so, pt with correcting for LLE buckling without PT assist and not present until fatigued at end of ambulation. Verbal cuing for upright posture multiple times to avoid spinal flexion. Pt required seated rest breaks due to fatigue.    Stairs             Wheelchair Mobility    Modified Rankin (Stroke Patients Only)       Balance Overall balance assessment: History of Falls;Needs assistance Sitting-balance support: Feet supported Sitting balance-Leahy Scale: Fair     Standing balance support: Bilateral upper extremity supported Standing balance-Leahy Scale: Poor Standing balance comment: requires PT steadying and UE support for ambulation                             Cognition Arousal/Alertness: Awake/alert Behavior During Therapy: WFL for tasks assessed/performed Overall Cognitive Status: Impaired/Different from baseline Area of Impairment: Memory;Orientation;Safety/judgement;Problem solving;Attention;Following commands                 Orientation Level: Disoriented to;Time;Situation Current Attention Level: Sustained Memory: Decreased short-term memory;Decreased recall of precautions Following Commands: Follows one step commands consistently  Safety/Judgement: Decreased awareness of deficits;Decreased awareness of safety   Problem Solving:  Difficulty sequencing;Requires verbal cues;Slow processing;Requires tactile cues General Comments: Pt unaware he had OT prior to PT visit, no recall of spinal precautions and pt unaware he had spinal surgery. Pt with pleasant affect today, very cooperative with PT.       Exercises General Exercises - Lower Extremity Ankle Circles/Pumps: AROM;Both;10 reps;Seated Quad Sets: AROM;10 reps;Seated    General Comments        Pertinent Vitals/Pain Pain Assessment: Faces Faces Pain Scale: Hurts little more Pain Location: back  Pain Descriptors / Indicators: Discomfort Pain Intervention(s): Monitored during session;Limited activity within patient's tolerance;Repositioned    Home Living                      Prior Function            PT Goals (current goals can now be found in the care plan section) Acute Rehab PT Goals Patient Stated Goal: none stated  PT Goal Formulation: With patient Time For Goal Achievement: 12/10/18 Potential to Achieve Goals: Good Progress towards PT goals: Progressing toward goals    Frequency    Min 5X/week      PT Plan Current plan remains appropriate    Co-evaluation              AM-PAC PT "6 Clicks" Mobility   Outcome Measure  Help needed turning from your back to your side while in a flat bed without using bedrails?: A Lot Help needed moving from lying on your back to sitting on the side of a flat bed without using bedrails?: A Lot Help needed moving to and from a bed to a chair (including a wheelchair)?: A Lot Help needed standing up from a chair using your arms (e.g., wheelchair or bedside chair)?: A Lot Help needed to walk in hospital room?: A Little Help needed climbing 3-5 steps with a railing? : A Lot 6 Click Score: 13    End of Session Equipment Utilized During Treatment: Gait belt Activity Tolerance: Patient limited by fatigue;Patient limited by pain Patient left: in bed;with call bell/phone within reach;with bed  alarm set;with family/visitor present Nurse Communication: Mobility status;Other (comment)(NT aware pt back in bed, NT to remove pt's foley catheter ) PT Visit Diagnosis: Muscle weakness (generalized) (M62.81);Other abnormalities of gait and mobility (R26.89)     Time: 5102-5852 PT Time Calculation (min) (ACUTE ONLY): 26 min  Charges:  $Gait Training: 23-37 mins                     [name], PT Acute Rehabilitation Services Pager [phone]  Office [phone]    Roxine Caddy D Elonda Husky 12/01/2018, 1:59 PM

## 2018-12-01 NOTE — Progress Notes (Signed)
Subjective: 5 Days Post-Op Procedure(s) (LRB): Lumbar central decompression L2-3, L3-4 (N/A) Patient reports pain as 1 on 0-10 scale. He is less confused tonight. His HBG has been gradually decreasing. I called his wife and received permission to tranfuse him. HBG is now 7.5 Will gve him 2-units of packed cwlls and will start Potassium p.o.   Objective: Vital signs in last 24 hours: Temp:  [97.7 F (36.5 C)-98.7 F (37.1 C)] 98.7 F (37.1 C) (02/24 1327) Pulse Rate:  [39-99] 92 (02/24 1327) Resp:  [18] 18 (02/24 1327) BP: (120-149)/(70-111) 120/70 (02/24 1327) SpO2:  [96 %-98 %] 98 % (02/24 1327)  Intake/Output from previous day: 02/23 0701 - 02/24 0700 In: 2732.2 [P.O.:360; I.V.:2372.2] Out: 2700 [Urine:2700] Intake/Output this shift: Total I/O In: 1314.1 [P.O.:240; I.V.:1074.1] Out: 400 [Urine:400]  Recent Labs    11/29/18 0517 11/30/18 0636 12/01/18 0808 12/01/18 1559  HGB 8.3* 8.4* 7.9* 7.5*   Recent Labs    11/30/18 0636 12/01/18 0808 12/01/18 1559  WBC 6.0 4.4  --   RBC 2.89* 2.73*  --   HCT 26.5* 25.0* 23.9*  PLT 127* 150  --    Recent Labs    11/30/18 0636 12/01/18 0808  NA 136 137  K 3.2* 3.1*  CL 106 107  CO2 23 24  BUN 9 7*  CREATININE 0.78 0.84  GLUCOSE 106* 105*  CALCIUM 8.3* 8.2*   No results for input(s): LABPT, INR in the last 72 hours.  Dorsiflexion/Plantar flexion intact   Assessment/Plan: 5 Days Post-Op Procedure(s) (LRB): Lumbar central decompression L2-3, L3-4 (N/A) Up with therapy,after transfusion.      Derrick [name] 12/01/2018, 6:02 PM

## 2018-12-01 NOTE — Progress Notes (Signed)
Subjective: 5 Days Post-Op Procedure(s) (LRB): Lumbar central decompression L2-3, L3-4 (N/A) Patient reports pain as mild. Patient seen in rounds for Dr. Gladstone Lighter. Patient is continuing to have some confusion. Per RN he was very agitated last night, trying to get out of bed on his own. Has calmed this morrning. Patient reports bladder spasms and has been trying to remove his foley. Reports minimal low back pain. No leg pain. Positive flatus and BM.  Plan is to go Skilled nursing facility after hospital stay.  Objective: Vital signs in last 24 hours: Temp:  [98.3 F (36.8 C)] 98.3 F (36.8 C) (02/23 1952) Pulse Rate:  [39-83] 83 (02/23 1953) Resp:  [18-20] 18 (02/23 1952) BP: (132-149)/(86-111) 132/93 (02/23 1953) SpO2:  [96 %-97 %] 96 % (02/23 1952)  Intake/Output from previous day:  Intake/Output Summary (Last 24 hours) at 12/01/2018 0849 Last data filed at 12/01/2018 0406 Gross per 24 hour  Intake 2732.15 ml  Output 2700 ml  Net 32.15 ml     Labs: Recent Labs    11/29/18 0517 11/30/18 0636 12/01/18 0808  HGB 8.3* 8.4* 7.9*   Recent Labs    11/30/18 0636 12/01/18 0808  WBC 6.0 4.4  RBC 2.89* 2.73*  HCT 26.5* 25.0*  PLT 127* 150   Recent Labs    11/30/18 0636 12/01/18 0808  NA 136 137  K 3.2* 3.1*  CL 106 107  CO2 23 24  BUN 9 7*  CREATININE 0.78 0.84  GLUCOSE 106* 105*  CALCIUM 8.3* 8.2*    EXAM General - Patient is Alert. Oriented to location but has some confusion about situation.  Extremity - Neurologically intact Intact pulses distally Dorsiflexion/Plantar flexion intact No cellulitis present Compartment soft Dressing/Incision - clean, dry, no drainage Motor Function - intact, moving foot and toes well on exam.   Past Medical History:  Diagnosis Date  . Anemia   . Anxiety   . Arthritis, rheumatoid ([place])   . BPH (benign prostatic hyperplasia)   . CAD (coronary artery disease)   . CKD (chronic kidney disease)   . Depression   .  Diverticulosis   . Elevated PSA   . Gangrene ([place])    of toe right foot  . GERD (gastroesophageal reflux disease)   . Herpes simplex labialis   . High cholesterol   . History of colonic polyps   . Hypertension   . Insomnia   . Lacunar infarction ([place])   . Memory loss   . Obesity   . Panic attacks   . Prostate cancer ([place]) YRS AGO  . Renal cell cancer ([place])    s/p nephrectomy in 2000  . Seropositive rheumatoid arthritis ([place])   . Spermatocele   . Tinnitus   . Tremor of both hands     Assessment/Plan: 5 Days Post-Op Procedure(s) (LRB): Lumbar central decompression L2-3, L3-4 (N/A) Active Problems:   Thrombocytopenia ([place])   Spinal stenosis, lumbar region with neurogenic claudication   Tachycardia   Gross hematuria   Acute metabolic encephalopathy   Acute blood loss anemia  Estimated body mass index is 23.33 kg/m as calculated from the following:   Height as of this encounter: 6' (1.829 m).   Weight as of this encounter: 78 kg. Advance diet Up with therapy Discharge to SNF when stable  DVT Prophylaxis - TED hose and SCDs. Restart Plavix and aspirin Weight-Bearing as tolerated  Will continue to monitor. Hopeful for DC to SNF in the next couple days. Post op delirium a  little worse than yesterday morning. Will repeat Hgb this afternoon. Remove foley as hematuria has resolved. Will monitor closely for patient's ability to void on his own. May require cystoscopy after this hospitalization. Dr. Gladstone Lighter has discussed plan of care with patient's family.   Ardeen Jourdain, PA-C Orthopaedic Surgery 12/01/2018, 8:49 AM

## 2018-12-02 LAB — CBC
HCT: 33.2 % — ABNORMAL LOW (ref 39.0–52.0)
Hemoglobin: 10.4 g/dL — ABNORMAL LOW (ref 13.0–17.0)
MCH: 28.7 pg (ref 26.0–34.0)
MCHC: 31.3 g/dL (ref 30.0–36.0)
MCV: 91.5 fL (ref 80.0–100.0)
Platelets: 151 10*3/uL (ref 150–400)
RBC: 3.63 MIL/uL — ABNORMAL LOW (ref 4.22–5.81)
RDW: 15.2 % (ref 11.5–15.5)
WBC: 5.9 10*3/uL (ref 4.0–10.5)
nRBC: 0 % (ref 0.0–0.2)

## 2018-12-02 LAB — COMPREHENSIVE METABOLIC PANEL
ALT: 27 U/L (ref 0–44)
AST: 30 U/L (ref 15–41)
Albumin: 2.9 g/dL — ABNORMAL LOW (ref 3.5–5.0)
Alkaline Phosphatase: 34 U/L — ABNORMAL LOW (ref 38–126)
Anion gap: 5 (ref 5–15)
BUN: 8 mg/dL (ref 8–23)
CO2: 25 mmol/L (ref 22–32)
Calcium: 8.5 mg/dL — ABNORMAL LOW (ref 8.9–10.3)
Chloride: 108 mmol/L (ref 98–111)
Creatinine, Ser: 0.89 mg/dL (ref 0.61–1.24)
GFR calc Af Amer: >60 mL/min (ref >60)
GFR calc non Af Amer: >60 mL/min (ref >60)
Glucose, Bld: 96 mg/dL (ref 70–99)
Potassium: 3.6 mmol/L (ref 3.5–5.1)
Sodium: 138 mmol/L (ref 135–145)
Total Bilirubin: 0.9 mg/dL (ref 0.3–1.2)
Total Protein: 5.2 g/dL — ABNORMAL LOW (ref 6.5–8.1)

## 2018-12-02 NOTE — Progress Notes (Signed)
Clinical Social Worker spoke with admission coordinator at [facility]. [facility] stated they will be able to take patient and stated they will start authorization through [facility] Medicare. CSW will follow up with facility in the morning.   Rhea Pink, MSW,  [place]

## 2018-12-02 NOTE — Progress Notes (Signed)
Physical Therapy Treatment Patient Details Name: Derrick [name] MRN: [id number] DOB: 1932-06-28 Today's Date: 12/02/2018    History of Present Illness 85 yo male s/p lumbar decompression L2-L3, L3-L4 on 11/26/18. PMH includs anemia, anxiety, CAD, CKD, depression, diverticulosis, gangrene, HTN, lacunar infarct, memory loss, obesity, prostate cancer, renal cancer, RA, R transmet amputation, nephrectomy.     PT Comments    Pt with slightly increased ambulation distance today, but required seated rest breaks due to fatigue. Pt requiring min-mod assist to safely perform mobility tasks. Pt continues to present with cognitive deficits, including decreased short term memory, no recall of back precautions even with verbal reinforcements x3 during session, and decreased orientation to place, time, and situation. Pt did not know he had back surgery when asked today. PT continuing to strongly recommend SNF placement given pt's mobility and cognitive deficits, as pt's wife and sister-in-law are at bedside and state they do not feel pt is ready to d/c home either. PT to continue to follow.    Follow Up Recommendations  SNF;Supervision/Assistance - 24 hour     Equipment Recommendations  None recommended by PT    Recommendations for Other Services       Precautions / Restrictions Precautions Precautions: Fall;Back Precaution Comments: Pt unable to recall any back precautions. PT explained precautions to pt, and he repeated them to me immediately, but had no recall of them 2 minutes later.  Restrictions Weight Bearing Restrictions: No    Mobility  Bed Mobility Overal bed mobility: Needs Assistance Bed Mobility: Sit to Sidelying;Rolling;Sidelying to Sit Rolling: Min assist Sidelying to sit: Min assist     Sit to sidelying: Mod assist General bed mobility comments: Min assist for rolling and sidelying to sit for completion of roll and coordination of trunk and LEs to maintain precautions when  sitting up. Mod assist for sit to sidelying and return to supine for LE and trunk management.   Transfers Overall transfer level: Needs assistance Equipment used: Rolling walker (2 wheeled) Transfers: Sit to/from Stand Sit to Stand: Min assist         General transfer comment: Min assist for power up, steadying. Pt with less LLE buckling during ambulation. Verbal reinforcement to maintain erect spinal position to avoid spinal flexion.   Ambulation/Gait Ambulation/Gait assistance: Min assist;+2 safety/equipment(pt's son with chair follow ) Gait Distance (Feet): 50 Feet(1x25, 1x15, 1x10 ft) Assistive device: Rolling walker (2 wheeled) Gait Pattern/deviations: Step-through pattern;Decreased stride length;Decreased stance time - left;Decreased weight shift to left;Trunk flexed Gait velocity: decr    General Gait Details: Min assist for steadying, close guarding of LLE but no buckling present this session. Pt with UE trembling with fatigue, and stating "my legs feel weak". Pt required 2 seated rest breaks during ambulation due to fatigue and discomfort.    Stairs             Wheelchair Mobility    Modified Rankin (Stroke Patients Only)       Balance Overall balance assessment: History of Falls;Needs assistance Sitting-balance support: Feet supported Sitting balance-Leahy Scale: Fair     Standing balance support: Bilateral upper extremity supported Standing balance-Leahy Scale: Poor Standing balance comment: requires PT steadying and UE support for ambulation                             Cognition Arousal/Alertness: Awake/alert Behavior During Therapy: WFL for tasks assessed/performed Overall Cognitive Status: Impaired/Different from baseline Area of Impairment: Memory;Orientation;Safety/judgement;Problem solving;Attention;Following  commands                 Orientation Level: Disoriented to;Place;Time;Situation Current Attention Level:  Sustained Memory: Decreased recall of precautions;Decreased short-term memory Following Commands: Follows one step commands consistently Safety/Judgement: Decreased awareness of deficits;Decreased awareness of safety   Problem Solving: Difficulty sequencing;Requires verbal cues;Slow processing;Requires tactile cues General Comments: Pt did not know he had spinal surgery even when told at the start of session. Pt disoriented to place as well. Pt cannot recall any precautions when asked x3 during session, with PT explaining them 3 separate times during session. Pt able to follow one step commands well.        Exercises General Exercises - Lower Extremity Long Arc Quad: AAROM;Left;Right;10 reps;Seated    General Comments        Pertinent Vitals/Pain Pain Assessment: Faces Faces Pain Scale: Hurts a little bit Pain Location: back  Pain Descriptors / Indicators: Discomfort Pain Intervention(s): Limited activity within patient's tolerance;Monitored during session    Home Living                      Prior Function            PT Goals (current goals can now be found in the care plan section) Acute Rehab PT Goals Patient Stated Goal: none stated  PT Goal Formulation: With patient Time For Goal Achievement: 12/10/18 Potential to Achieve Goals: Good Progress towards PT goals: Progressing toward goals    Frequency    Min 5X/week      PT Plan Current plan remains appropriate    Co-evaluation              AM-PAC PT "6 Clicks" Mobility   Outcome Measure  Help needed turning from your back to your side while in a flat bed without using bedrails?: A Lot Help needed moving from lying on your back to sitting on the side of a flat bed without using bedrails?: A Lot Help needed moving to and from a bed to a chair (including a wheelchair)?: A Lot Help needed standing up from a chair using your arms (e.g., wheelchair or bedside chair)?: A Little Help needed to walk in  hospital room?: A Little Help needed climbing 3-5 steps with a railing? : A Lot 6 Click Score: 14    End of Session Equipment Utilized During Treatment: Gait belt Activity Tolerance: Patient limited by fatigue;Patient limited by pain Patient left: in bed;with call bell/phone within reach;with bed alarm set;with family/visitor present Nurse Communication: Mobility status;Other (comment)(NT aware pt back in bed, NT to remove pt's foley catheter ) PT Visit Diagnosis: Muscle weakness (generalized) (M62.81);Other abnormalities of gait and mobility (R26.89)     Time: 1308-6578 PT Time Calculation (min) (ACUTE ONLY): 20 min  Charges:  $Gait Training: 8-22 mins                    [name], PT Acute Rehabilitation Services Pager [phone]  Office [phone]   Arine Foley D Elonda Husky 12/02/2018, 3:01 PM

## 2018-12-02 NOTE — Progress Notes (Signed)
Subjective: 6 Days Post-Op Procedure(s) (LRB): Lumbar central decompression L2-3, L3-4 (N/A) Patient reports pain as 1 on 0-10 scale.  Todayis the first time that his mind is much clearer.He recognizes his family and me.He had two units of Packed RBCs.HBG 10,4.  Objective: Vital signs in last 24 hours: Temp:  [98.3 F (36.8 C)-98.7 F (37.1 C)] 98.3 F (36.8 C) (02/25 0530) Pulse Rate:  [82-98] 88 (02/25 0530) Resp:  [15-18] 16 (02/25 0530) BP: (120-157)/(70-91) 157/88 (02/25 0530) SpO2:  [98 %-99 %] 99 % (02/25 0530)  Intake/Output from previous day: 02/24 0701 - 02/25 0700 In: 2076.1 [P.O.:240; I.V.:1074.1; Blood:762] Out: 850 [Urine:850] Intake/Output this shift: Total I/O In: -  Out: 300 [Urine:300]  Recent Labs    11/30/18 0636 12/01/18 0808 12/01/18 1559 12/02/18 0539  HGB 8.4* 7.9* 7.5* 10.4*   Recent Labs    12/01/18 0808 12/01/18 1559 12/02/18 0539  WBC 4.4  --  5.9  RBC 2.73*  --  3.63*  HCT 25.0* 23.9* 33.2*  PLT 150  --  151   Recent Labs    12/01/18 0808 12/02/18 0539  NA 137 138  K 3.1* 3.6  CL 107 108  CO2 24 25  BUN 7* 8  CREATININE 0.84 0.89  GLUCOSE 105* 96  CALCIUM 8.2* 8.5*   No results for input(s): LABPT, INR in the last 72 hours.  No cellulitis present   Assessment/Plan: 6 Days Post-Op Procedure(s) (LRB): Lumbar central decompression L2-3, L3-4 (N/A) Up with therapy.Will DC home tomorrow with PT at home.      [name] 12/02/2018, 12:34 PM

## 2018-12-02 NOTE — Care Management Important Message (Signed)
Important Message  Patient Details  Name: Derrick [name] MRN: [id number] Date of Birth: 1931-11-25   Medicare Important Message Given:  Yes    Kerin Salen 12/02/2018, 12:28 [place] Message  Patient Details  Name: Derrick [name] MRN: [id number] Date of Birth: December 08, 1931   Medicare Important Message Given:  Yes    Kerin Salen 12/02/2018, 12:28 PM

## 2018-12-02 NOTE — Progress Notes (Signed)
Clinical Social Worker following patient for support and discharge needs. Family was at bedside (spouse, daughter in law) with patient as he slept. CSW gave family a list of SNF that are able to take patient for rehab. Family stated they will let CSW know there decision as soon as possible so CSW can start authorization.   Rhea Pink, MSW,  [place]

## 2018-12-03 DIAGNOSIS — M4326 Fusion of spine, lumbar region: Secondary | ICD-10-CM | POA: Diagnosis not present

## 2018-12-03 DIAGNOSIS — R52 Pain, unspecified: Secondary | ICD-10-CM | POA: Diagnosis not present

## 2018-12-03 DIAGNOSIS — I10 Essential (primary) hypertension: Secondary | ICD-10-CM | POA: Diagnosis not present

## 2018-12-03 DIAGNOSIS — N189 Chronic kidney disease, unspecified: Secondary | ICD-10-CM | POA: Diagnosis not present

## 2018-12-03 DIAGNOSIS — G9341 Metabolic encephalopathy: Secondary | ICD-10-CM | POA: Diagnosis not present

## 2018-12-03 DIAGNOSIS — M6281 Muscle weakness (generalized): Secondary | ICD-10-CM | POA: Diagnosis not present

## 2018-12-03 DIAGNOSIS — Z7401 Bed confinement status: Secondary | ICD-10-CM | POA: Diagnosis not present

## 2018-12-03 DIAGNOSIS — I2510 Atherosclerotic heart disease of native coronary artery without angina pectoris: Secondary | ICD-10-CM | POA: Diagnosis not present

## 2018-12-03 DIAGNOSIS — I6381 Other cerebral infarction due to occlusion or stenosis of small artery: Secondary | ICD-10-CM | POA: Diagnosis not present

## 2018-12-03 DIAGNOSIS — M2550 Pain in unspecified joint: Secondary | ICD-10-CM | POA: Diagnosis not present

## 2018-12-03 DIAGNOSIS — R310 Gross hematuria: Secondary | ICD-10-CM | POA: Diagnosis not present

## 2018-12-03 DIAGNOSIS — K219 Gastro-esophageal reflux disease without esophagitis: Secondary | ICD-10-CM | POA: Diagnosis not present

## 2018-12-03 DIAGNOSIS — M4807 Spinal stenosis, lumbosacral region: Secondary | ICD-10-CM | POA: Diagnosis not present

## 2018-12-03 DIAGNOSIS — M069 Rheumatoid arthritis, unspecified: Secondary | ICD-10-CM | POA: Diagnosis not present

## 2018-12-03 DIAGNOSIS — Z4789 Encounter for other orthopedic aftercare: Secondary | ICD-10-CM | POA: Diagnosis not present

## 2018-12-03 DIAGNOSIS — D649 Anemia, unspecified: Secondary | ICD-10-CM | POA: Diagnosis not present

## 2018-12-03 DIAGNOSIS — K5900 Constipation, unspecified: Secondary | ICD-10-CM | POA: Diagnosis not present

## 2018-12-03 DIAGNOSIS — R2689 Other abnormalities of gait and mobility: Secondary | ICD-10-CM | POA: Diagnosis not present

## 2018-12-03 LAB — COMPREHENSIVE METABOLIC PANEL
ALT: 31 U/L (ref 0–44)
AST: 33 U/L (ref 15–41)
Albumin: 3.6 g/dL (ref 3.5–5.0)
Alkaline Phosphatase: 39 U/L (ref 38–126)
Anion gap: 9 (ref 5–15)
BUN: 7 mg/dL — ABNORMAL LOW (ref 8–23)
CO2: 23 mmol/L (ref 22–32)
CREATININE: 0.82 mg/dL (ref 0.61–1.24)
Calcium: 9.2 mg/dL (ref 8.9–10.3)
Chloride: 106 mmol/L (ref 98–111)
GFR calc Af Amer: >60 mL/min (ref >60)
Glucose, Bld: 98 mg/dL (ref 70–99)
Potassium: 3.5 mmol/L (ref 3.5–5.1)
Sodium: 138 mmol/L (ref 135–145)
Total Bilirubin: 1.2 mg/dL (ref 0.3–1.2)
Total Protein: 6.5 g/dL (ref 6.5–8.1)

## 2018-12-03 LAB — TYPE AND SCREEN
ABO/RH(D): O POS
ANTIBODY SCREEN: NEGATIVE
UNIT DIVISION: 0
Unit division: 0

## 2018-12-03 LAB — BPAM RBC
Blood Product Expiration Date: 202003222359
Blood Product Expiration Date: 202003222359
ISSUE DATE / TIME: 202002242238
ISSUE DATE / TIME: 202002250137
UNIT TYPE AND RH: 5100
Unit Type and Rh: 5100

## 2018-12-03 LAB — CBC
HCT: 37.3 % — ABNORMAL LOW (ref 39.0–52.0)
Hemoglobin: 12.1 g/dL — ABNORMAL LOW (ref 13.0–17.0)
MCH: 28.6 pg (ref 26.0–34.0)
MCHC: 32.4 g/dL (ref 30.0–36.0)
MCV: 88.2 fL (ref 80.0–100.0)
Platelets: 200 10*3/uL (ref 150–400)
RBC: 4.23 MIL/uL (ref 4.22–5.81)
RDW: 15.2 % (ref 11.5–15.5)
WBC: 7.8 10*3/uL (ref 4.0–10.5)
nRBC: 0 % (ref 0.0–0.2)

## 2018-12-03 MED ORDER — METHOCARBAMOL 500 MG PO TABS: 500.0000 mg | ORAL_TABLET | Freq: Four times a day (QID) | ORAL | 0 refills | Status: DC | PRN

## 2018-12-03 MED ORDER — HYDROCODONE-ACETAMINOPHEN 5-325 MG PO TABS: 1.0000 | ORAL_TABLET | Freq: Four times a day (QID) | ORAL | 0 refills | Status: DC | PRN

## 2018-12-03 MED ORDER — CEPHALEXIN 500 MG PO CAPS: 500.0000 mg | ORAL_CAPSULE | Freq: Two times a day (BID) | ORAL | 0 refills | Status: DC

## 2018-12-03 NOTE — Progress Notes (Signed)
Physical Therapy Treatment Patient Details Name: Derrick [name] MRN: [id number] DOB: Apr 13, 1932 Today's Date: 12/03/2018    History of Present Illness 85 yo male s/p lumbar decompression L2-L3, L3-L4 on 11/26/18. PMH includs anemia, anxiety, CAD, CKD, depression, diverticulosis, gangrene, HTN, lacunar infarct, memory loss, obesity, prostate cancer, renal cancer, RA, R transmet amputation, nephrectomy.     PT Comments    Pt with improved ambulation distance today, required 2 seated rest breaks due to LLE and cardiovascular fatigue. Pt with continued cognitive impairment post-surgery, requires near constant reinforcement to maintain spinal precautions with mobility. Pt to d/c to SNF, awaiting authorization. PT to continue to follow while acute.    Follow Up Recommendations  SNF;Supervision/Assistance - 24 hour     Equipment Recommendations  None recommended by PT    Recommendations for Other Services       Precautions / Restrictions Precautions Precautions: Fall;Back Precaution Comments: frequent reinforcements for spinal precautions, pt with no knowledge of precautions or that he had a back surgery  Restrictions Weight Bearing Restrictions: No    Mobility  Bed Mobility Overal bed mobility: Needs Assistance Bed Mobility: Sit to Sidelying;Rolling Rolling: Min assist Sidelying to sit: Min assist     Sit to sidelying: Mod assist General bed mobility comments: Assist for completion of roll, mod assist for Sit to sidelying and return to supine for LE lifting and trunk lowering. Pt up in chair upon PT arrival to room.   Transfers Overall transfer level: Needs assistance Equipment used: Rolling walker (2 wheeled) Transfers: Sit to/from Stand Sit to Stand: Min assist         General transfer comment: Min assist for power up, steadying upon standing. Pt with mild UE tremors with initial standing. Verbal reinforcement to keep back straight as pt with tendency toward forward flexion  of spine.   Ambulation/Gait Ambulation/Gait assistance: Min assist;+2 safety/equipment Gait Distance (Feet): 70 Feet(1x30, 1x20, 1x20) Assistive device: Rolling walker (2 wheeled) Gait Pattern/deviations: Step-through pattern;Decreased stride length;Decreased stance time - left;Decreased weight shift to left;Trunk flexed Gait velocity: decr    General Gait Details: Min assist for steadying, frequent posture cues to maintain neutral spinal position. Pt requiring 2 seated rest breaks during ambulation due to fatigue and LLE weakness. Pt with mild LLE buckling when fatigued, pt corrected.    Stairs             Wheelchair Mobility    Modified Rankin (Stroke Patients Only)       Balance Overall balance assessment: History of Falls;Needs assistance Sitting-balance support: Feet supported Sitting balance-Leahy Scale: Fair     Standing balance support: Bilateral upper extremity supported Standing balance-Leahy Scale: Poor Standing balance comment: requires PT steadying and UE support for ambulation                             Cognition Arousal/Alertness: Awake/alert Behavior During Therapy: WFL for tasks assessed/performed Overall Cognitive Status: Impaired/Different from baseline Area of Impairment: Safety/judgement;Awareness;Orientation;Attention;Memory                 Orientation Level: Disoriented to;Place;Time;Situation Current Attention Level: Sustained Memory: Decreased recall of precautions;Decreased short-term memory Following Commands: Follows one step commands consistently Safety/Judgement: Decreased awareness of deficits;Decreased awareness of safety   Problem Solving: Difficulty sequencing;Requires verbal cues;Slow processing;Requires tactile cues General Comments: Pt with no recollection of precautions or spinal surgery, pt very pleasant.       Exercises      General  Comments        Pertinent Vitals/Pain Pain Assessment:  Faces Pain Score: 5  Faces Pain Scale: Hurts little more Pain Location: LLE Pain Descriptors / Indicators: Discomfort Pain Intervention(s): Limited activity within patient's tolerance;Repositioned;Monitored during session;Premedicated before session    Home Living                      Prior Function            PT Goals (current goals can now be found in the care plan section) Acute Rehab PT Goals Patient Stated Goal: none stated  PT Goal Formulation: With patient Time For Goal Achievement: 12/10/18 Potential to Achieve Goals: Good Progress towards PT goals: Progressing toward goals    Frequency    Min 5X/week      PT Plan Current plan remains appropriate    Co-evaluation              AM-PAC PT "6 Clicks" Mobility   Outcome Measure  Help needed turning from your back to your side while in a flat bed without using bedrails?: A Lot Help needed moving from lying on your back to sitting on the side of a flat bed without using bedrails?: A Lot Help needed moving to and from a bed to a chair (including a wheelchair)?: A Lot Help needed standing up from a chair using your arms (e.g., wheelchair or bedside chair)?: A Little Help needed to walk in hospital room?: A Little Help needed climbing 3-5 steps with a railing? : A Lot 6 Click Score: 14    End of Session Equipment Utilized During Treatment: Gait belt Activity Tolerance: Patient limited by fatigue;Patient limited by pain Patient left: in bed;with call bell/phone within reach;with bed alarm set;with family/visitor present Nurse Communication: Mobility status PT Visit Diagnosis: Muscle weakness (generalized) (M62.81);Other abnormalities of gait and mobility (R26.89)     Time: 7681-1572 PT Time Calculation (min) (ACUTE ONLY): 17 min  Charges:  $Gait Training: 8-22 mins                     Derrick [name], PT Acute Rehabilitation Services Pager [phone]  Office [phone]   Derrick [name] 12/03/2018, 2:27 PM

## 2018-12-03 NOTE — Progress Notes (Signed)
Occupational Therapy Treatment Patient Details Name: Derrick [name] MRN: [id number] DOB: [birth year] Today's Date: 12/03/2018    History of present illness 85 yo male s/p lumbar decompression L2-L3, L3-L4 on 11/26/18. PMH includs anemia, anxiety, CAD, CKD, depression, diverticulosis, gangrene, HTN, lacunar infarct, memory loss, obesity, prostate cancer, renal cancer, RA, R transmet amputation, nephrectomy.    OT comments  Pt improving with mobility.  Plans to go to  SNF  Follow Up Recommendations  SNF    Equipment Recommendations  None recommended by OT    Recommendations for Other Services      Precautions / Restrictions Precautions Precautions: Fall;Back       Mobility Bed Mobility Overal bed mobility: Needs Assistance Bed Mobility: Sit to Sidelying;Rolling;Sidelying to Sit Rolling: Min assist Sidelying to sit: Min assist          Transfers      min- mod A with stand pivot transfer                Balance Overall balance assessment: History of Falls;Needs assistance Sitting-balance support: Feet supported Sitting balance-Leahy Scale: Fair     Standing balance support: Bilateral upper extremity supported Standing balance-Leahy Scale: Poor                             ADL either performed or assessed with clinical judgement   ADL Overall ADL's : Needs assistance/impaired Eating/Feeding: Set up;Sitting   Grooming: Set up;Sitting               Lower Body Dressing: Maximal assistance;Sit to/from stand;Cueing for sequencing;Cueing for safety;Adhering to back precautions   Toilet Transfer: Stand-pivot;Cueing for safety;Cueing for sequencing;RW;Moderate assistance   Toileting- Clothing Manipulation and Hygiene: Cueing for safety;Cueing for sequencing;Cueing for back precautions;Sit to/from stand;Adhering to back precautions;Moderate assistance         General ADL Comments: LLE with increased strength this day.         Vision Patient  Visual Report: No change from baseline            Cognition Arousal/Alertness: Awake/alert Behavior During Therapy: WFL for tasks assessed/performed Overall Cognitive Status: Impaired/Different from baseline Area of Impairment: Safety/judgement;Awareness                   Current Attention Level: Sustained Memory: Decreased recall of precautions;Decreased short-term memory Following Commands: Follows one step commands consistently Safety/Judgement: Decreased awareness of deficits;Decreased awareness of safety   Problem Solving: Difficulty sequencing;Requires verbal cues;Slow processing;Requires tactile cues                     Pertinent Vitals/ Pain       Pain Score: 5  Pain Location: LLE Pain Descriptors / Indicators: Discomfort Pain Intervention(s): Limited activity within patient's tolerance;Repositioned     Prior Functioning/Environment              Frequency  Min 2X/week        Progress Toward Goals  OT Goals(current goals can now be found in the care plan section)  Progress towards OT goals: Progressing toward goals     Plan Discharge plan remains appropriate    Co-evaluation                 AM-PAC OT "6 Clicks" Daily Activity     Outcome Measure   Help from another person eating meals?: A Little Help from another person taking care of personal grooming?: A Little  Help from another person toileting, which includes using toliet, bedpan, or urinal?: A Lot Help from another person bathing (including washing, rinsing, drying)?: A Lot Help from another person to put on and taking off regular upper body clothing?: A Little Help from another person to put on and taking off regular lower body clothing?: A Lot 6 Click Score: 15    End of Session Equipment Utilized During Treatment: Rolling walker;Gait belt  OT Visit Diagnosis: Pain;Other abnormalities of gait and mobility (R26.89);Muscle weakness (generalized) (M62.81)   Activity  Tolerance Patient tolerated treatment well   Patient Left with call bell/phone within reach;in chair;with chair alarm set   Nurse Communication Mobility status        Time: 1050-1105 OT Time Calculation (min): 15 min  Charges: OT General Charges $OT Visit: 1 Visit OT Treatments $Self Care/Home Management : 8-22 mins  [name], [place] [phone] Office- [phone]      [name], Edwena Felty D 12/03/2018, 12:52 PM

## 2018-12-03 NOTE — Clinical Social Work Placement (Signed)
   CLINICAL SOCIAL WORK PLACEMENT  NOTE  Date:  12/03/2018  Patient Details  Name: Derrick [name] MRN: [id number] Date of Birth: Aug 01, 1932  Clinical Social Work is seeking post-discharge placement for this patient at the [facility] level of care (*CSW will initial, date and re-position this form in  chart as items are completed):  Yes   Patient/family provided with [facility] Work Department's list of facilities offering this level of care within the geographic area requested by the patient (or if unable, by the patient's family).  Yes   Patient/family informed of their freedom to choose among providers that offer the needed level of care, that participate in Medicare, Medicaid or managed care program needed by the patient, have an available bed and are willing to accept the patient.      Patient/family informed of [facility]'s ownership interest in [facility] and [facility], as well as of the fact that they are under no obligation to receive care at these facilities.  PASRR submitted to EDS on       PASRR number received on       Existing PASRR number confirmed on 12/03/18     FL2 transmitted to all facilities in geographic area requested by pt/family on 12/03/18     FL2 transmitted to all facilities within larger geographic area on       Patient informed that his/her managed care company has contracts with or will negotiate with certain facilities, including the following:            Patient/family informed of bed offers received.  Patient chooses bed at [facility]     Physician recommends and patient chooses bed at      Patient to be transferred to [facility] on 12/03/18.  Patient to be transferred to facility by ptar     Patient family notified on 12/03/18 of transfer.  Name of family member notified:  spoke with spouse     PHYSICIAN       Additional Comment:    _______________________________________________ Wende Neighbors, LCSW 12/03/2018, 3:08 PM

## 2018-12-03 NOTE — Progress Notes (Signed)
CSW following patient for support and discharge needs. Patient at this time does not have authorization through insurance or a pasrr through the state. Facility stated authorization was started through insurance yesterday. CSW will continue to follow patient for discharge needs   Rhea Pink, MSW,  [place]

## 2018-12-03 NOTE — Progress Notes (Signed)
Subjective: 7 Days Post-Op Procedure(s) (LRB): Lumbar central decompression L2-3, L3-4 (N/A) Patient reports pain as 1 on 0-10 scale.Much better today. Will DC toSNF. Case discussed with his family.HBG 12.1    Objective: Vital signs in last 24 hours: Temp:  [98 F (36.7 C)-98.2 F (36.8 C)] 98 F (36.7 C) (02/26 0617) Pulse Rate:  [72-98] 72 (02/26 0617) Resp:  [17-22] 20 (02/26 0617) BP: (132-141)/(78-95) 141/95 (02/26 0617) SpO2:  [94 %-100 %] 100 % (02/26 0617)  Intake/Output from previous day: 02/25 0701 - 02/26 0700 In: 480 [P.O.:480] Out: 3040 [Urine:3040] Intake/Output this shift: No intake/output data recorded.  Recent Labs    12/01/18 0808 12/01/18 1559 12/02/18 0539 12/03/18 0558  HGB 7.9* 7.5* 10.4* 12.1*   Recent Labs    12/02/18 0539 12/03/18 0558  WBC 5.9 7.8  RBC 3.63* 4.23  HCT 33.2* 37.3*  PLT 151 200   Recent Labs    12/02/18 0539 12/03/18 0558  NA 138 138  K 3.6 3.5  CL 108 106  CO2 25 23  BUN 8 7*  CREATININE 0.89 0.82  GLUCOSE 96 98  CALCIUM 8.5* 9.2   No results for input(s): LABPT, INR in the last 72 hours.  He is much more alert   Assessment/Plan: 7 Days Post-Op Procedure(s) (LRB): Lumbar central decompression L2-3, L3-4 (N/A) Up with therapy Discharge to SNF      [name] 12/03/2018, 10:01 AM

## 2018-12-03 NOTE — Discharge Summary (Signed)
Physician Discharge Summary   Patient ID: Derrick [name] MRN: [id number] DOB/AGE: [birth year] 85 y.o.  Admit date: 11/26/2018 Discharge date: 12/03/2018  Primary Diagnosis:   Lumbar stenosis L2-3, L3-4  Admission Diagnoses:  Past Medical History:  Diagnosis Date  . Anemia   . Anxiety   . Arthritis, rheumatoid ([place])   . BPH (benign prostatic hyperplasia)   . CAD (coronary artery disease)   . CKD (chronic kidney disease)   . Depression   . Diverticulosis   . Elevated PSA   . Gangrene ([place])    of toe right foot  . GERD (gastroesophageal reflux disease)   . Herpes simplex labialis   . High cholesterol   . History of colonic polyps   . Hypertension   . Insomnia   . Lacunar infarction ([place])   . Memory loss   . Obesity   . Panic attacks   . Prostate cancer ([place]) YRS AGO  . Renal cell cancer ([place])    s/p nephrectomy in 2000  . Seropositive rheumatoid arthritis ([place])   . Spermatocele   . Tinnitus   . Tremor of both hands    Discharge Diagnoses:   Active Problems:   Thrombocytopenia ([place])   Spinal stenosis, lumbar region with neurogenic claudication   Tachycardia   Gross hematuria   Acute metabolic encephalopathy   Acute blood loss anemia  Procedure:  Procedure(s) (LRB): Lumbar central decompression L2-3, L3-4 (N/A)   Consults: cardiology, neurology, urology and hosiptalist service  HPI:  The patient presented to the office with the chief complaint of low back pain. He started having low back pain in December of last year with no known injury. He developed left leg pain as well. He noted weakness in his left LE as well. He had to start using a wheelchair because of his symptoms. CT myelogram was ordered and show a complete block at L2-L3 and L3-L4. He does have a history of decompression at L5-S1 about 30 years ago. He did not improve with conservative measures, therefore the decision for surgery was made.     Laboratory Data: Hospital Outpatient Visit on 11/24/2018    Component Date Value Ref Range Status  . WBC 11/24/2018 8.2  4.0 - 10.5 K/uL Final  . RBC 11/24/2018 4.48  4.22 - 5.81 MIL/uL Final  . Hemoglobin 11/24/2018 12.9* 13.0 - 17.0 g/dL Final  . HCT 11/24/2018 40.5  39.0 - 52.0 % Final  . MCV 11/24/2018 90.4  80.0 - 100.0 fL Final  . MCH 11/24/2018 28.8  26.0 - 34.0 pg Final  . MCHC 11/24/2018 31.9  30.0 - 36.0 g/dL Final  . RDW 11/24/2018 15.9* 11.5 - 15.5 % Final  . Platelets 11/24/2018 199  150 - 400 K/uL Final  . nRBC 11/24/2018 0.0  0.0 - 0.2 % Final  . Neutrophils Relative % 11/24/2018 59  % Final  . Neutro Abs 11/24/2018 5.0  1.7 - 7.7 K/uL Final  . Lymphocytes Relative 11/24/2018 26  % Final  . Lymphs Abs 11/24/2018 2.1  0.7 - 4.0 K/uL Final  . Monocytes Relative 11/24/2018 10  % Final  . Monocytes Absolute 11/24/2018 0.8  0.1 - 1.0 K/uL Final  . Eosinophils Relative 11/24/2018 3  % Final  . Eosinophils Absolute 11/24/2018 0.2  0.0 - 0.5 K/uL Final  . Basophils Relative 11/24/2018 1  % Final  . Basophils Absolute 11/24/2018 0.0  0.0 - 0.1 K/uL Final  . Immature Granulocytes 11/24/2018 1  % Final  . Abs  Immature Granulocytes 11/24/2018 0.04  0.00 - 0.07 K/uL Final   Performed at [facility] [address] [place], [place] [address]  . Sodium 11/24/2018 138  135 - 145 mmol/L Final  . Potassium 11/24/2018 4.4  3.5 - 5.1 mmol/L Final  . Chloride 11/24/2018 106  98 - 111 mmol/L Final  . CO2 11/24/2018 23  22 - 32 mmol/L Final  . Glucose, Bld 11/24/2018 116* 70 - 99 mg/dL Final  . BUN 11/24/2018 16  8 - 23 mg/dL Final  . Creatinine, Ser 11/24/2018 1.30* 0.61 - 1.24 mg/dL Final  . Calcium 11/24/2018 9.6  8.9 - 10.3 mg/dL Final  . Total Protein 11/24/2018 6.9  6.5 - 8.1 g/dL Final  . Albumin 11/24/2018 4.5  3.5 - 5.0 g/dL Final  . AST 11/24/2018 24  15 - 41 U/L Final  . ALT 11/24/2018 24  0 - 44 U/L Final  . Alkaline Phosphatase 11/24/2018 42  38 - 126 U/L Final  . Total Bilirubin 11/24/2018 1.1  0.3 - 1.2 mg/dL  Final  . GFR calc non Af Amer 11/24/2018 50* >60 mL/min Final  . GFR calc Af Amer 11/24/2018 58* >60 mL/min Final  . Anion gap 11/24/2018 9  5 - 15 Final   Performed at [facility], [place] [address], [place], [place] [address]  . Prothrombin Time 11/24/2018 12.7  11.4 - 15.2 seconds Final  . INR 11/24/2018 0.96   Final   Performed at [facility] [address], [place], [place] [address]  . ABO/RH(D) 11/24/2018 O POS   Final  . Antibody Screen 11/24/2018 NEG   Final  . Sample Expiration 11/24/2018 11/29/2018   Final  . Extend sample reason 11/24/2018    Final                   Value:NO TRANSFUSIONS OR PREGNANCY IN THE PAST 3 MONTHS Performed at [facility], [place] [address] [place], [place] [address]   . MRSA, PCR 11/24/2018 NEGATIVE  NEGATIVE Final  . Staphylococcus aureus 11/24/2018 POSITIVE* NEGATIVE Final   Comment: (NOTE) The Xpert SA Assay (FDA approved for NASAL specimens in patients 25 years of age and older), is one component of a comprehensive surveillance program. It is not intended to diagnose infection nor to guide or monitor treatment. Performed at [facility], [place] [address] [place], [place] [address]   . ABO/RH(D) 11/24/2018    Final                   Value:O POS Performed at [facility], [place] [address] [place], [place] [address]    Recent Labs    12/01/18 [phone] 12/01/18 1559 12/02/18 0539 12/03/18 0558  HGB 7.9* 7.5* 10.4* 12.1*   Recent Labs    12/02/18 0539 12/03/18 0558  WBC 5.9 7.8  RBC 3.63* 4.23  HCT 33.2* 37.3*  PLT 151 200   Recent Labs    12/02/18 0539 12/03/18 0558  NA 138 138  K 3.6 3.5  CL 108 106  CO2 25 23  BUN 8 7*  CREATININE 0.89 0.82  GLUCOSE 96 98  CALCIUM 8.5* 9.2    X-Rays:Dg Lumbar Spine 2-3 Views  Result Date: 11/24/2018 CLINICAL DATA:  85 year old male for preoperative lumbar decompression. Subsequent encounter.  EXAM: LUMBAR SPINE - 2-3 VIEW COMPARISON:  10/24/2018 postmyelogram CT lumbar spine. FINDINGS: Rudimentary disc at S1-2 level. Level assignment as per prior postmyelogram  CT. 7.7 mm anterior slip L5.  Moderate L5-S1 disc space narrowing. 3 mm anterior slip L4 with moderate to marked narrowing posterior aspect L4-5 disc space. Minimal retrolisthesis L3. L3-4 disc space narrowing greater on left. Minimal retrolisthesis L2 with prominent right-sided disc space narrowing. Prominent L1-2 disc degeneration with disc space narrowing greater on the right. Vascular calcifications. IMPRESSION: 1. Rudimentary disc at S1-2 level. Level assignment as per prior postmyelogram CT. 2. 7.7 mm anterior slip L5. Moderate L5-S1 disc space narrowing. 3. 3 mm anterior slip L4 with moderate to marked narrowing posterior aspect L4-5 disc space. 4. Minimal retrolisthesis L3. L3-4 disc space narrowing greater on left. 5. Minimal retrolisthesis L2 with prominent right-sided disc space narrowing. 6. Prominent L1-2 disc degeneration with disc space narrowing greater on the right. 7.  Aortic Atherosclerosis (ICD10-I70.0). Electronically Signed   By: Genia Del M.D.   On: 11/24/2018 16:55   Mr Brain Wo Contrast  Result Date: 11/29/2018 CLINICAL DATA:  Assess for TIA. History of hypertension, hypercholesterolemia, renal cell carcinoma and stroke. EXAM: MRI HEAD WITHOUT CONTRAST TECHNIQUE: Sagittal T1 and axial diffusion weighted imaging of the head. Patient could not tolerate further imaging and removed himself from scanner. COMPARISON:  CT HEAD April 29, 2018 FINDINGS: Severely motion degraded sequences. Brain: No reduced diffusion to suggest large vascular territory infarct though sequences motion degraded. No midline shift or mass effect. No hydrocephalus. Vascular: Nondiagnostic. Skull and upper cervical spine: Nondiagnostic. Sinuses/Orbits: Nondiagnostic. Other: None. IMPRESSION: 1. Limited severely motion degraded 2 sequence MRI head  without acute intracranial process. Electronically Signed   By: Elon Alas M.D.   On: 11/29/2018 14:27   Dg Spine Portable 1 View  Result Date: 11/26/2018 CLINICAL DATA:  Pt currently on operating table. Dr wants the vertebrae numbered please. EXAM: PORTABLE SPINE - 1 VIEW COMPARISON:  11/26/2018 and CT of the L-spine on 10/24/2018 FINDINGS: Posterior retractor is in place. Using the same numbering system as exam earlier today, a surgical instrument is identified posterior to the L3 vertebral body. IMPRESSION: Intraoperative localization. Electronically Signed   By: Nolon Nations M.D.   On: 11/26/2018 12:06   Dg Spine Portable 1 View  Result Date: 11/26/2018 CLINICAL DATA:  Lumbar disc disease. EXAM: PORTABLE SPINE - 1 VIEW COMPARISON:  CT myelogram dated 10/24/2018 FINDINGS: Instruments are present at the L2-3 level. IMPRESSION: Instruments at L2-3. Electronically Signed   By: Lorriane Shire M.D.   On: 11/26/2018 11:39   Dg Spine Portable 1 View  Result Date: 11/26/2018 CLINICAL DATA:  Elective lumbar surgery. EXAM: PORTABLE SPINE - 1 VIEW COMPARISON:  Radiograph of same day. FINDINGS: Single intraoperative cross-table lateral projection of the lumbar spine demonstrates surgical probes positioned over posterior spinous processes of L1 and L2. IMPRESSION: Surgical localization as described above. Electronically Signed   By: Marijo Conception, M.D.   On: 11/26/2018 11:32   Dg Spine Portable 1 View  Result Date: 11/26/2018 CLINICAL DATA:  Spinal surgery EXAM: PORTABLE SPINE - 1 VIEW COMPARISON:  Lumbar spine radiographs-11/24/2017 FINDINGS: A single spot intraoperative radiographic image of the lumbar spine is provided for review. Spinal labeling is in keeping with preprocedural lumbar spine radiographs performed 11/24/2018. Provided image demonstrates radiopaque marking instrument posterior to the L2 and L3 vertebral bodies. IMPRESSION: Intraoperative localization of L2 and L3 as above.  Electronically Signed   By: Sandi Mariscal M.D.   On: 11/26/2018 11:06    Hospital Course: Patient was admitted to [facility] and taken to the  OR and underwent the above state procedure without complications.  Patient tolerated the procedure well and was later transferred to the recovery room and then to the orthopaedic floor for postoperative care.  They were given PO and IV analgesics for pain control following their surgery.  They were given 24 hours of postoperative antibiotics.   PT was consulted postop to assist with mobility and transfers.  The patient was allowed to be WBAT with therapy and was taught back precautions. Discharge planning was consulted to help with postop disposition and equipment needs.  Patient had a difficult night on the evening of surgery due to palpitations and concern for afib. He was transferred to telemetry where he stabilized. He did have issue with hematuria which did resolve after 4 days. Due to this and blood loss from operative site, he developed ABLA requiring two units of blood. He developed post op delirium. Neurology was consulted and ruled out CVA. Patient cleared and became alert and oriented on post op day seven.  Patient was seen in rounds and was ready to go to SNF.  They were given discharge instructions and dressing directions.  They were instructed on when to follow up in the office with Dr. Gladstone Lighter.   Diet: Cardiac diet Activity:WBAT Follow-up:in 7 days Disposition - Skilled nursing facility Discharged Condition: stable   Discharge Instructions    Call MD / Call 911   Complete by:  As directed    If you experience chest pain or shortness of breath, CALL 911 and be transported to the hospital emergency room.  If you develope a fever above 101 F, pus (white drainage) or increased drainage or redness at the wound, or calf pain, call your surgeon's office.   Call MD / Call 911   Complete by:  As directed    If you experience chest pain or  shortness of breath, CALL 911 and be transported to the hospital emergency room.  If you develope a fever above 101 F, pus (white drainage) or increased drainage or redness at the wound, or calf pain, call your surgeon's office.   Constipation Prevention   Complete by:  As directed    Drink plenty of fluids.  Prune juice may be helpful.  You may use a stool softener, such as Colace (over the counter) 100 mg twice a day.  Use MiraLax (over the counter) for constipation as needed.   Constipation Prevention   Complete by:  As directed    Drink plenty of fluids.  Prune juice may be helpful.  You may use a stool softener, such as Colace (over the counter) 100 mg twice a day.  Use MiraLax (over the counter) for constipation as needed.   Diet - low sodium heart healthy   Complete by:  As directed    Diet - low sodium heart healthy   Complete by:  As directed    Discharge instructions   Complete by:  As directed    For the first three days, remove your dressing, and tape a piece of saran wrap over your incision. Take your shower, then remove the saran wrap and put a clean dressing on. After three days you can shower without the saran wrap.  No lifting or excessive bending. No driving while taking pain medications.  Call Dr. Gladstone Lighter if any wound complications or temperature of 101 degrees F or over.  Call the office for an appointment to see Dr. Gladstone Lighter in two weeks: [phone] and ask for Dr. Charlestine Night medical  assistant, Brunilda Payor.   Discharge instructions   Complete by:  As directed    Showers only. No tub bath No lifting or excessive bending. No driving while taking pain medications.  Call Dr. Gladstone Lighter if any wound complications or temperature of 101 degrees F or over.  Call the office for an appointment to see Dr. Gladstone Lighter in two weeks: [phone] and ask for Dr. Charlestine Night medical assistant, Brunilda Payor.   Increase activity slowly as tolerated   Complete by:  As directed    Increase  activity slowly as tolerated   Complete by:  As directed      Allergies as of 12/03/2018   No Known Allergies     Medication List    STOP taking these medications   oxyCODONE 5 MG immediate release tablet Commonly known as:  ROXICODONE   oxyCODONE-acetaminophen 5-325 MG tablet Commonly known as:  PERCOCET/ROXICET     TAKE these medications   acetaminophen 500 MG tablet Commonly known as:  TYLENOL Take 1,000 mg by mouth every 6 (six) hours as needed for moderate pain or headache.   amLODipine 5 MG tablet Commonly known as:  NORVASC Take 1 tablet (5 mg total) by mouth daily. SCHEDULE OV FOR FURTHER REFILLS. What changed:  when to take this   aspirin EC 81 MG tablet Take 81 mg by mouth at bedtime.   cephALEXin 500 MG capsule Commonly known as:  KEFLEX Take 1 capsule (500 mg total) by mouth every 12 (twelve) hours.   clopidogrel 75 MG tablet Commonly known as:  PLAVIX Take 75 mg by mouth daily.   docusate sodium 100 MG capsule Commonly known as:  COLACE Take 100 mg by mouth daily.   finasteride 5 MG tablet Commonly known as:  PROSCAR Take 5 mg by mouth every evening.   gabapentin 100 MG capsule Commonly known as:  NEURONTIN Take 1 capsule (100 mg total) by mouth 3 (three) times daily. What changed:  when to take this   HYDROcodone-acetaminophen 5-325 MG tablet Commonly known as:  NORCO/VICODIN Take 1 tablet by mouth every 6 (six) hours as needed for moderate pain ((score 4 to 6)).   hydroxychloroquine 200 MG tablet Commonly known as:  PLAQUENIL Take 200 mg by mouth daily.   methocarbamol 500 MG tablet Commonly known as:  ROBAXIN Take 1 tablet (500 mg total) by mouth every 6 (six) hours as needed for muscle spasms.   multivitamin with minerals Tabs tablet Take 1 tablet by mouth every evening.   Omega-3-6-9 Caps Take 2 capsules by mouth daily.   pantoprazole 40 MG tablet Commonly known as:  PROTONIX Take 40 mg by mouth every evening.   predniSONE 5  MG tablet Commonly known as:  DELTASONE Take 5 mg by mouth daily with breakfast.   sertraline 25 MG tablet Commonly known as:  ZOLOFT Take 25 mg by mouth daily.   traZODone 50 MG tablet Commonly known as:  DESYREL Take 50-100 mg by mouth at bedtime as needed for sleep.      Follow-up Information    [name], MD. Schedule an appointment as soon as possible for a visit on 12/25/2018.   Specialty:  Orthopedic Surgery Contact information: [address] [place] [address] [phone]           Signed: Ardeen Jourdain, PA-C Orthopaedic Surgery 12/03/2018, 10:07 AM

## 2018-12-03 NOTE — Progress Notes (Addendum)
Clinical Social Worker facilitated patient discharge including contacting patient family and facility to confirm patient discharge plans.  Clinical information faxed to facility and family agreeable with plan.  CSW arranged ambulance transport via [facility] to [facility] .  RN to call [phone] (rm# [phone]) for report prior to discharge.  Clinical Social Worker will sign off for now as social work intervention is no longer needed. Please consult [place] again if new need arises.  Rhea Pink, MSW, [place]

## 2018-12-05 DIAGNOSIS — M6281 Muscle weakness (generalized): Secondary | ICD-10-CM | POA: Diagnosis not present

## 2018-12-05 DIAGNOSIS — I2510 Atherosclerotic heart disease of native coronary artery without angina pectoris: Secondary | ICD-10-CM | POA: Diagnosis not present

## 2018-12-05 DIAGNOSIS — M4326 Fusion of spine, lumbar region: Secondary | ICD-10-CM | POA: Diagnosis not present

## 2018-12-05 DIAGNOSIS — K5900 Constipation, unspecified: Secondary | ICD-10-CM | POA: Diagnosis not present

## 2018-12-05 DIAGNOSIS — I10 Essential (primary) hypertension: Secondary | ICD-10-CM | POA: Diagnosis not present

## 2018-12-12 ENCOUNTER — Other Ambulatory Visit: Payer: Self-pay

## 2018-12-12 ENCOUNTER — Emergency Department (HOSPITAL_BASED_OUTPATIENT_CLINIC_OR_DEPARTMENT_OTHER)
Admit: 2018-12-12 | Discharge: 2018-12-12 | Disposition: A | Discharge status: Home / Self Care | Payer: Medicare Other | Attending: Emergency Medicine | Admitting: Emergency Medicine

## 2018-12-12 ENCOUNTER — Observation Stay (HOSPITAL_BASED_OUTPATIENT_CLINIC_OR_DEPARTMENT_OTHER): Payer: Medicare Other

## 2018-12-12 ENCOUNTER — Observation Stay (HOSPITAL_COMMUNITY)
Admission: EM | Admit: 2018-12-12 | Discharge: 2018-12-13 | Disposition: A | Discharge status: Home / Self Care | Payer: Medicare Other | Attending: Internal Medicine | Admitting: Internal Medicine

## 2018-12-12 ENCOUNTER — Emergency Department (HOSPITAL_COMMUNITY): Payer: Medicare Other

## 2018-12-12 ENCOUNTER — Encounter (HOSPITAL_COMMUNITY): Payer: Self-pay | Admitting: Internal Medicine

## 2018-12-12 DIAGNOSIS — R000 Tachycardia, unspecified: Secondary | ICD-10-CM | POA: Diagnosis not present

## 2018-12-12 DIAGNOSIS — Z89411 Acquired absence of right great toe: Secondary | ICD-10-CM | POA: Diagnosis not present

## 2018-12-12 DIAGNOSIS — Z8249 Family history of ischemic heart disease and other diseases of the circulatory system: Secondary | ICD-10-CM | POA: Diagnosis not present

## 2018-12-12 DIAGNOSIS — Z79899 Other long term (current) drug therapy: Secondary | ICD-10-CM | POA: Diagnosis not present

## 2018-12-12 DIAGNOSIS — Z87891 Personal history of nicotine dependence: Secondary | ICD-10-CM | POA: Insufficient documentation

## 2018-12-12 DIAGNOSIS — F329 Major depressive disorder, single episode, unspecified: Secondary | ICD-10-CM | POA: Diagnosis not present

## 2018-12-12 DIAGNOSIS — I2510 Atherosclerotic heart disease of native coronary artery without angina pectoris: Secondary | ICD-10-CM | POA: Diagnosis not present

## 2018-12-12 DIAGNOSIS — E860 Dehydration: Secondary | ICD-10-CM

## 2018-12-12 DIAGNOSIS — I739 Peripheral vascular disease, unspecified: Secondary | ICD-10-CM | POA: Diagnosis not present

## 2018-12-12 DIAGNOSIS — R609 Edema, unspecified: Secondary | ICD-10-CM

## 2018-12-12 DIAGNOSIS — I4891 Unspecified atrial fibrillation: Secondary | ICD-10-CM | POA: Diagnosis not present

## 2018-12-12 DIAGNOSIS — G4700 Insomnia, unspecified: Secondary | ICD-10-CM | POA: Insufficient documentation

## 2018-12-12 DIAGNOSIS — M1712 Unilateral primary osteoarthritis, left knee: Secondary | ICD-10-CM | POA: Insufficient documentation

## 2018-12-12 DIAGNOSIS — E785 Hyperlipidemia, unspecified: Secondary | ICD-10-CM | POA: Diagnosis not present

## 2018-12-12 DIAGNOSIS — R079 Chest pain, unspecified: Secondary | ICD-10-CM | POA: Diagnosis present

## 2018-12-12 DIAGNOSIS — R0789 Other chest pain: Principal | ICD-10-CM | POA: Insufficient documentation

## 2018-12-12 DIAGNOSIS — E7800 Pure hypercholesterolemia, unspecified: Secondary | ICD-10-CM | POA: Diagnosis not present

## 2018-12-12 DIAGNOSIS — K219 Gastro-esophageal reflux disease without esophagitis: Secondary | ICD-10-CM | POA: Diagnosis not present

## 2018-12-12 DIAGNOSIS — R9431 Abnormal electrocardiogram [ECG] [EKG]: Secondary | ICD-10-CM

## 2018-12-12 DIAGNOSIS — M069 Rheumatoid arthritis, unspecified: Secondary | ICD-10-CM | POA: Insufficient documentation

## 2018-12-12 DIAGNOSIS — F0390 Unspecified dementia without behavioral disturbance: Secondary | ICD-10-CM | POA: Diagnosis not present

## 2018-12-12 DIAGNOSIS — R0689 Other abnormalities of breathing: Secondary | ICD-10-CM | POA: Diagnosis not present

## 2018-12-12 DIAGNOSIS — Z89421 Acquired absence of other right toe(s): Secondary | ICD-10-CM | POA: Insufficient documentation

## 2018-12-12 DIAGNOSIS — I10 Essential (primary) hypertension: Secondary | ICD-10-CM

## 2018-12-12 DIAGNOSIS — F419 Anxiety disorder, unspecified: Secondary | ICD-10-CM | POA: Diagnosis not present

## 2018-12-12 DIAGNOSIS — M25562 Pain in left knee: Secondary | ICD-10-CM | POA: Diagnosis not present

## 2018-12-12 DIAGNOSIS — I129 Hypertensive chronic kidney disease with stage 1 through stage 4 chronic kidney disease, or unspecified chronic kidney disease: Secondary | ICD-10-CM | POA: Diagnosis not present

## 2018-12-12 DIAGNOSIS — Z905 Acquired absence of kidney: Secondary | ICD-10-CM | POA: Insufficient documentation

## 2018-12-12 DIAGNOSIS — Z7902 Long term (current) use of antithrombotics/antiplatelets: Secondary | ICD-10-CM | POA: Diagnosis not present

## 2018-12-12 DIAGNOSIS — N183 Chronic kidney disease, stage 3 (moderate): Secondary | ICD-10-CM | POA: Insufficient documentation

## 2018-12-12 DIAGNOSIS — R002 Palpitations: Secondary | ICD-10-CM | POA: Diagnosis not present

## 2018-12-12 DIAGNOSIS — N400 Enlarged prostate without lower urinary tract symptoms: Secondary | ICD-10-CM | POA: Diagnosis not present

## 2018-12-12 DIAGNOSIS — M549 Dorsalgia, unspecified: Secondary | ICD-10-CM | POA: Insufficient documentation

## 2018-12-12 DIAGNOSIS — N189 Chronic kidney disease, unspecified: Secondary | ICD-10-CM | POA: Diagnosis present

## 2018-12-12 DIAGNOSIS — R52 Pain, unspecified: Secondary | ICD-10-CM | POA: Diagnosis not present

## 2018-12-12 LAB — TROPONIN I
Troponin I: <0.03 ng/mL (ref <0.03)
Troponin I: <0.03 ng/mL (ref <0.03)

## 2018-12-12 LAB — CBC
HCT: 41.2 % (ref 39.0–52.0)
Hemoglobin: 13.8 g/dL (ref 13.0–17.0)
MCH: 28.9 pg (ref 26.0–34.0)
MCHC: 33.5 g/dL (ref 30.0–36.0)
MCV: 86.4 fL (ref 80.0–100.0)
Platelets: 341 10*3/uL (ref 150–400)
RBC: 4.77 MIL/uL (ref 4.22–5.81)
RDW: 15 % (ref 11.5–15.5)
WBC: 10.3 10*3/uL (ref 4.0–10.5)
nRBC: 0 % (ref 0.0–0.2)

## 2018-12-12 LAB — ECHOCARDIOGRAM COMPLETE
Height: 70 in
Weight: 2582.03 oz

## 2018-12-12 LAB — BASIC METABOLIC PANEL
Anion gap: 11 (ref 5–15)
BUN: 10 mg/dL (ref 8–23)
CO2: 17 mmol/L — ABNORMAL LOW (ref 22–32)
Calcium: 9.8 mg/dL (ref 8.9–10.3)
Chloride: 105 mmol/L (ref 98–111)
Creatinine, Ser: 1.16 mg/dL (ref 0.61–1.24)
GFR calc Af Amer: >60 mL/min (ref >60)
GFR calc non Af Amer: 58 mL/min — ABNORMAL LOW (ref >60)
Glucose, Bld: 118 mg/dL — ABNORMAL HIGH (ref 70–99)
Potassium: 3.6 mmol/L (ref 3.5–5.1)
Sodium: 133 mmol/L — ABNORMAL LOW (ref 135–145)

## 2018-12-12 LAB — SEDIMENTATION RATE: SED RATE: 7 mm/h (ref 0–16)

## 2018-12-12 LAB — BRAIN NATRIURETIC PEPTIDE: B NATRIURETIC PEPTIDE 5: 69.1 pg/mL (ref 0.0–100.0)

## 2018-12-12 LAB — C-REACTIVE PROTEIN

## 2018-12-12 MED ORDER — OMEGA-3-ACID ETHYL ESTERS 1 G PO CAPS
1.0000 g | ORAL_CAPSULE | Freq: Two times a day (BID) | ORAL | Status: DC
  Administered 2018-12-13: 1 g via ORAL
  Filled 2018-12-12: qty 1

## 2018-12-12 MED ORDER — MORPHINE SULFATE (PF) 4 MG/ML IV SOLN
4.0000 mg | Freq: Once | INTRAVENOUS | Status: AC
  Administered 2018-12-12: 4 mg via INTRAVENOUS
  Filled 2018-12-12: qty 1

## 2018-12-12 MED ORDER — PANTOPRAZOLE SODIUM 40 MG PO TBEC
40.0000 mg | DELAYED_RELEASE_TABLET | Freq: Every evening | ORAL | Status: DC
  Administered 2018-12-12: 40 mg via ORAL
  Filled 2018-12-12: qty 1

## 2018-12-12 MED ORDER — METOPROLOL TARTRATE 5 MG/5ML IV SOLN
2.5000 mg | Freq: Once | INTRAVENOUS | Status: AC
  Administered 2018-12-12: 2.5 mg via INTRAVENOUS
  Filled 2018-12-12: qty 5

## 2018-12-12 MED ORDER — SODIUM CHLORIDE 0.9% FLUSH
3.0000 mL | Freq: Once | INTRAVENOUS | Status: AC
  Administered 2018-12-12: 3 mL via INTRAVENOUS

## 2018-12-12 MED ORDER — SERTRALINE HCL 50 MG PO TABS
25.0000 mg | ORAL_TABLET | Freq: Every day | ORAL | Status: DC
  Administered 2018-12-12 – 2018-12-13 (×2): 25 mg via ORAL
  Filled 2018-12-12 (×2): qty 1

## 2018-12-12 MED ORDER — HALOPERIDOL LACTATE 5 MG/ML IJ SOLN
5.0000 mg | Freq: Once | INTRAMUSCULAR | Status: AC
  Administered 2018-12-12: 5 mg via INTRAMUSCULAR
  Filled 2018-12-12: qty 1

## 2018-12-12 MED ORDER — DOCUSATE SODIUM 100 MG PO CAPS
100.0000 mg | ORAL_CAPSULE | Freq: Every day | ORAL | Status: DC
  Administered 2018-12-12 – 2018-12-13 (×2): 100 mg via ORAL
  Filled 2018-12-12 (×2): qty 1

## 2018-12-12 MED ORDER — PREDNISONE 5 MG PO TABS
5.0000 mg | ORAL_TABLET | Freq: Every day | ORAL | Status: DC
  Administered 2018-12-12 – 2018-12-13 (×2): 5 mg via ORAL
  Filled 2018-12-12 (×2): qty 1

## 2018-12-12 MED ORDER — ACETAMINOPHEN 500 MG PO TABS
1000.0000 mg | ORAL_TABLET | Freq: Four times a day (QID) | ORAL | Status: DC | PRN
  Administered 2018-12-13: 1000 mg via ORAL
  Filled 2018-12-12: qty 2

## 2018-12-12 MED ORDER — HALOPERIDOL LACTATE 5 MG/ML IJ SOLN: 5.0000 mg | Freq: Once | INTRAMUSCULAR | Status: DC

## 2018-12-12 MED ORDER — HYDROCODONE-ACETAMINOPHEN 5-325 MG PO TABS: 1.0000 | ORAL_TABLET | Freq: Four times a day (QID) | ORAL | Status: DC | PRN

## 2018-12-12 MED ORDER — ENOXAPARIN SODIUM 40 MG/0.4ML ~~LOC~~ SOLN
40.0000 mg | SUBCUTANEOUS | Status: DC
  Administered 2018-12-12: 40 mg via SUBCUTANEOUS
  Filled 2018-12-12: qty 0.4

## 2018-12-12 MED ORDER — OMEGA-3-6-9 PO CAPS: 2.0000 | ORAL_CAPSULE | Freq: Every day | ORAL | Status: DC

## 2018-12-12 MED ORDER — CLOPIDOGREL BISULFATE 75 MG PO TABS
75.0000 mg | ORAL_TABLET | Freq: Every day | ORAL | Status: DC
  Administered 2018-12-12 – 2018-12-13 (×2): 75 mg via ORAL
  Filled 2018-12-12 (×2): qty 1

## 2018-12-12 MED ORDER — HYDROXYCHLOROQUINE SULFATE 200 MG PO TABS
200.0000 mg | ORAL_TABLET | Freq: Every day | ORAL | Status: DC
  Administered 2018-12-12 – 2018-12-13 (×2): 200 mg via ORAL
  Filled 2018-12-12 (×2): qty 1

## 2018-12-12 MED ORDER — SODIUM CHLORIDE 0.9 % IV BOLUS
500.0000 mL | Freq: Once | INTRAVENOUS | Status: AC
  Administered 2018-12-12: 500 mL via INTRAVENOUS

## 2018-12-12 MED ORDER — SODIUM CHLORIDE 0.9 % IV SOLN
Freq: Once | INTRAVENOUS | Status: AC
  Administered 2018-12-12: 11:00:00 via INTRAVENOUS

## 2018-12-12 MED ORDER — GABAPENTIN 100 MG PO CAPS: 100.0000 mg | ORAL_CAPSULE | Freq: Every day | ORAL | Status: DC

## 2018-12-12 MED ORDER — LORAZEPAM 2 MG/ML IJ SOLN
1.0000 mg | Freq: Once | INTRAMUSCULAR | Status: AC
  Administered 2018-12-12: 1 mg via INTRAMUSCULAR
  Filled 2018-12-12: qty 1

## 2018-12-12 MED ORDER — AMLODIPINE BESYLATE 5 MG PO TABS
5.0000 mg | ORAL_TABLET | Freq: Every day | ORAL | Status: DC
  Administered 2018-12-12 – 2018-12-13 (×2): 5 mg via ORAL
  Filled 2018-12-12 (×2): qty 1

## 2018-12-12 MED ORDER — ADULT MULTIVITAMIN W/MINERALS CH
1.0000 | ORAL_TABLET | Freq: Every evening | ORAL | Status: DC
  Administered 2018-12-12: 1 via ORAL
  Filled 2018-12-12: qty 1

## 2018-12-12 MED ORDER — TRAZODONE HCL 50 MG PO TABS
50.0000 mg | ORAL_TABLET | Freq: Every evening | ORAL | Status: DC | PRN
  Filled 2018-12-12: qty 1

## 2018-12-12 MED ORDER — METHOCARBAMOL 500 MG PO TABS
500.0000 mg | ORAL_TABLET | Freq: Four times a day (QID) | ORAL | Status: DC | PRN
  Administered 2018-12-13: 500 mg via ORAL
  Filled 2018-12-12: qty 1

## 2018-12-12 MED ORDER — FINASTERIDE 5 MG PO TABS
5.0000 mg | ORAL_TABLET | Freq: Every evening | ORAL | Status: DC
  Administered 2018-12-12: 5 mg via ORAL
  Filled 2018-12-12: qty 1

## 2018-12-12 NOTE — H&P (Signed)
History and Physical    Derrick [name] DUK:[id number] DOB: 01/31/1932 DOA: 12/12/2018  PCP: [name], MD  Patient coming from: home   I have personally briefly reviewed patient's old medical records available.   Chief Complaint: Chest pain and palpitation.  HPI: Derrick [name] is a 85 y.o. male with medical history significant of coronary artery disease, rheumatoid arthritis, BPH, chronic kidney disease stage 3, hypertension, right foot gangrene status post amputation, recent lumbar spine surgery for radiculopathy presenting from home with palpitation and chest discomfort since today morning.  Patient underwent spinal surgery 2 weeks ago, he was still having left knee pain and back pain issues.  He saw orthopedics few days ago and was prescribed prednisone and pain medications.  Today morning, he woke up with chest discomfort, left precordial, described as quivering, it was very bothersome, transient and improved when EMS was called.  No recurrence of palpitations or chest pain since then.  This was associated with feeling jittery, nervous and anxious. Recent surgery.  Denies any shortness of breath or wheezing.  Denies any leg swelling.  He does have pain on the left knee for which he was prescribed prednisone and Norco. Reviewed previous records, no mention of A. fib in the past. ED Course: Patient reportedly had heart rate of 130 reported by EMS, irregular.  Otherwise vitals were stable.  He was given a dose of fentanyl by EMS for left knee pain. In the emergency room, he was slightly tachycardic otherwise hemodynamically stable.  Point-of-care troponin negative.  Twelve-lead EKG shows A. fib rate controlled.  Chest x-ray shows no acute findings.  Left knee x-ray shows osteoarthritic changes.  His postoperative wound is healing well.  Review of Systems: As per HPI otherwise 10 point review of systems negative.    Past Medical History:  Diagnosis Date  . Anemia   . Anxiety   . Arthritis,  rheumatoid ([place])   . BPH (benign prostatic hyperplasia)   . CAD (coronary artery disease)   . CKD (chronic kidney disease)   . Depression   . Diverticulosis   . Elevated PSA   . Gangrene ([place])    of toe right foot  . GERD (gastroesophageal reflux disease)   . Herpes simplex labialis   . High cholesterol   . History of colonic polyps   . Hypertension   . Insomnia   . Lacunar infarction ([place])   . Memory loss   . Obesity   . Panic attacks   . Prostate cancer ([place]) YRS AGO  . Renal cell cancer ([place])    s/p nephrectomy in 2000  . Seropositive rheumatoid arthritis ([place])   . Spermatocele   . Tinnitus   . Tremor of both hands     Past Surgical History:  Procedure Laterality Date  . AMPUTATION TOE Right 06/13/2017   Procedure: AMPUTATION TOE 1st and 2nd;  Surgeon: [name], DPM;  Location: [facility];  Service: Podiatry;  Laterality: Right;  . AMPUTATION TOE     RIGHT FOOT ALL TOES GONE  . BACK SURGERY  YRS AGO   LOWER  . COLONOSCOPY    . GRAFT APPLICATION Right 3/76/2831   Procedure: EPIFIX GRAFT APPLICATION OF RIGHT FOOT;  Surgeon: [name], DPM;  Location: [facility];  Service: Podiatry;  Laterality: Right;  . I&D EXTREMITY Right 09/13/2017   Procedure: IRRIGATION AND DEBRIDEMENT AND BONE BIOPSY;  Surgeon: [name], DPM;  Location: [facility];  Service: Podiatry;  Laterality: Right;  .  INCISION AND DRAINAGE OF WOUND Right 12/18/2017   Procedure: IRRIGATION AND DEBRIDEMENT WOUND ON RIGHT FOOT;  Surgeon: [name], DPM;  Location: [facility];  Service: Podiatry;  Laterality: Right;  . IR ANGIOGRAM EXTREMITY RIGHT  05/30/2017  . IR ANGIOGRAM EXTREMITY RIGHT  08/08/2017  . IR ANGIOGRAM SELECTIVE EACH ADDITIONAL VESSEL  08/08/2017  . IR ANGIOGRAM SELECTIVE EACH ADDITIONAL VESSEL  08/08/2017  . IR RADIOLOGIST EVAL & MGMT  05/16/2017  . IR TIB-PERO ART PTA MOD SED  05/30/2017  . IR TIB-PERO ART PTA MOD SED  08/08/2017  . IR TIB-PERO ART UNI PTA EA ADD VESSEL MOD SED  05/30/2017  .  IR TIB-PERO ART UNI PTA EA ADD VESSEL MOD SED  08/08/2017  . IR US GUIDE VASC ACCESS RIGHT  05/30/2017  . IR US GUIDE VASC ACCESS RIGHT  08/08/2017  . LEFT HEART CATHETERIZATION WITH CORONARY ANGIOGRAM N/A 11/19/2011   Procedure: LEFT HEART CATHETERIZATION WITH CORONARY ANGIOGRAM;  Surgeon: [name], MD;  Location: [facility] CATH LAB;  Service: Cardiovascular;  Laterality: N/A;  . LUMBAR LAMINECTOMY/DECOMPRESSION MICRODISCECTOMY N/A 11/26/2018   Procedure: Lumbar central decompression L2-3, L3-4;  Surgeon: [name], MD;  Location: [facility];  Service: Orthopedics;  Laterality: N/A;  144min  . NEPHRECTOMY  10/1998  . TRANSMETATARSAL AMPUTATION Right 08/14/2017   Procedure: TRANSMETATARSAL AMPUTATION;  Surgeon: [name], DPM;  Location: [facility] [facility];  Service: Podiatry;  Laterality: Right;     reports that he quit smoking about 40 years ago. His smoking use included cigarettes. He has a 29.00 pack-year smoking history. He has never used smokeless tobacco. He reports that he does not drink alcohol or use drugs.  No Known Allergies  Family History  Problem Relation Age of Onset  . Heart attack Father 53  . Stroke Father   . Kidney failure Sister        Bright's disease   . Cancer Paternal Aunt        d. 35s; NOS cancer of leg  . Kidney disease Sister        d. 39; "Bright's disease" - possible kidney cancer?  . Cancer Sister        NOS cancer; d. 54s; "sick in the hospital"  . Heart attack Son 22  . Leukemia Son 14  . COPD Son   . Breast cancer Daughter 15     Prior to Admission medications   Medication Sig Start Date End Date Taking? Authorizing Provider  acetaminophen (TYLENOL) 500 MG tablet Take 1,000 mg by mouth every 6 (six) hours as needed for moderate pain or headache.   Yes [provider]  amLODipine (NORVASC) 5 MG tablet Take 1 tablet (5 mg total) by mouth daily. SCHEDULE OV FOR FURTHER REFILLS. 09/17/18  Yes Croitoru, Mihai, MD  clopidogrel (PLAVIX) 75 MG tablet  Take 75 mg by mouth daily.   Yes [provider]  docusate sodium (COLACE) 100 MG capsule Take 100 mg by mouth daily.    Yes [provider]  finasteride (PROSCAR) 5 MG tablet Take 5 mg by mouth every evening.    Yes [provider]  gabapentin (NEURONTIN) 100 MG capsule Take 1 capsule (100 mg total) by mouth 3 (three) times daily. Patient taking differently: Take 100 mg by mouth at bedtime.  06/11/18  Yes Mesner, Corene Cornea, MD  hydroxychloroquine (PLAQUENIL) 200 MG tablet Take 200 mg by mouth daily.   Yes [provider]  methocarbamol (ROBAXIN) 500 MG tablet Take 1  tablet (500 mg total) by mouth every 6 (six) hours as needed for muscle spasms. 12/03/18  Yes Constable, Museum/gallery conservator, PA-C  Multiple Vitamin (MULTIVITAMIN WITH MINERALS) TABS tablet Take 1 tablet by mouth every evening.    Yes [provider]  Omega-3-6-9 CAPS Take 2 capsules by mouth daily.   Yes [provider]  pantoprazole (PROTONIX) 40 MG tablet Take 40 mg by mouth every evening.    Yes [provider]  predniSONE (DELTASONE) 5 MG tablet Take 5 mg by mouth daily with breakfast.   Yes [provider]  sertraline (ZOLOFT) 25 MG tablet Take 25 mg by mouth daily.   Yes [provider]  traZODone (DESYREL) 50 MG tablet Take 50-100 mg by mouth at bedtime as needed for sleep.    Yes [provider]  HYDROcodone-acetaminophen (NORCO/VICODIN) 5-325 MG tablet Take 1 tablet by mouth every 6 (six) hours as needed for moderate pain ((score 4 to 6)). Patient not taking: Reported on 12/12/2018 12/03/18   Ardeen Jourdain, PA-C    Physical Exam: Vitals:   12/12/18 1100 12/12/18 1130 12/12/18 1200 12/12/18 1230  BP: 136/86 (!) 150/85 131/79 (!) 131/92  Pulse: 92   (!) 55  Resp: 12 16  14   SpO2: 96%   95%    Constitutional: NAD, calm, comfortable Vitals:   12/12/18 1100 12/12/18 1130 12/12/18 1200 12/12/18 1230  BP: 136/86 (!) 150/85 131/79 (!) 131/92  Pulse: 92    (!) 55  Resp: 12 16  14   SpO2: 96%   95%   Eyes: PERRL, lids and conjunctivae normal ENMT: Mucous membranes are moist. Posterior pharynx clear of any exudate or lesions.Normal dentition.  Neck: normal, supple, no masses, no thyromegaly Respiratory: clear to auscultation bilaterally, no wheezing, no crackles. Normal respiratory effort. No accessory muscle use.  Cardiovascular: Irregular rate and rhythm, pansystolic murmur 2 /6 at mitral area, no rubs / gallops. No extremity edema. 2+ pedal pulses. No carotid bruits.  Abdomen: no tenderness, no masses palpated. No hepatosplenomegaly. Bowel sounds positive.  Musculoskeletal: no clubbing / cyanosis. No joint deformity upper and lower extremities. Good ROM, no contractures. Normal muscle tone.  Left knee is slightly bigger than the right knee, no effusion or localized tenderness. Right transmetatarsal amputation and stump clean and dry. Skin: no rashes, lesions, ulcers. No induration Neurologic: CN 2-12 grossly intact. Sensation intact, DTR normal. Strength 5/5 in all 4.  Psychiatric: Normal judgment and insight. Alert and oriented x 3. Normal mood.  Post op incision clean and dry    Labs on Admission: I have personally reviewed following labs and imaging studies  CBC: Recent Labs  Lab 12/12/18 0843  WBC 10.3  HGB 13.8  HCT 41.2  MCV 86.4  PLT 970   Basic Metabolic Panel: Recent Labs  Lab 12/12/18 0843  NA 133*  K 3.6  CL 105  CO2 17*  GLUCOSE 118*  BUN 10  CREATININE 1.16  CALCIUM 9.8   GFR: CrCl cannot be calculated (Unknown ideal weight.). Liver Function Tests: No results for input(s): AST, ALT, ALKPHOS, BILITOT, PROT, ALBUMIN in the last 168 hours. No results for input(s): LIPASE, AMYLASE in the last 168 hours. No results for input(s): AMMONIA in the last 168 hours. Coagulation Profile: No results for input(s): INR, PROTIME in the last 168 hours. Cardiac Enzymes: Recent Labs  Lab 12/12/18 0843  TROPONINI <0.03     BNP (last 3 results) No results for input(s): PROBNP in the last 8760 hours. HbA1C: No results  for input(s): HGBA1C in the last 72 hours. CBG: No results for input(s): GLUCAP in the last 168 hours. Lipid Profile: No results for input(s): CHOL, HDL, LDLCALC, TRIG, CHOLHDL, LDLDIRECT in the last 72 hours. Thyroid Function Tests: No results for input(s): TSH, T4TOTAL, FREET4, T3FREE, THYROIDAB in the last 72 hours. Anemia Panel: No results for input(s): VITAMINB12, FOLATE, FERRITIN, TIBC, IRON, RETICCTPCT in the last 72 hours. Urine analysis:    Component Value Date/Time   COLORURINE RED (A) 11/26/2018 1036   APPEARANCEUR HAZY (A) 11/26/2018 1036   LABSPEC 1.016 11/26/2018 1036   PHURINE 6.0 11/26/2018 1036   GLUCOSEU NEGATIVE 11/26/2018 1036   HGBUR LARGE (A) 11/26/2018 1036   BILIRUBINUR NEGATIVE 11/26/2018 1036   KETONESUR NEGATIVE 11/26/2018 1036   PROTEINUR 30 (A) 11/26/2018 1036   UROBILINOGEN 1.0 11/27/2013 0947   NITRITE NEGATIVE 11/26/2018 1036   LEUKOCYTESUR NEGATIVE 11/26/2018 1036    Radiological Exams on Admission: Dg Chest 2 View  Result Date: 12/12/2018 CLINICAL DATA:  A fib  Chest  Pain   Chronic  Knee pain  No  injury EXAM: CHEST - 2 VIEW COMPARISON:  09/19/2017 FINDINGS: Stable elevation of the LEFT hemidiaphragm. Tortuous aorta. Heart size is normal. Lungs are free of focal consolidations and pleural effusions. No pulmonary edema. There is midthoracic spondylosis. IMPRESSION: No evidence for acute cardiopulmonary abnormality. Electronically Signed   By: Nolon Nations M.D.   On: 12/12/2018 09:34   Dg Knee Complete 4 Views Left  Result Date: 12/12/2018 CLINICAL DATA:  A fib  Chest  Pain   Chronic  Knee pain  No  injury EXAM: LEFT KNEE - COMPLETE 4+ VIEW COMPARISON:  10/05/2018 FINDINGS: There are mild degenerative changes in the MEDIAL of the LATERAL compartments. Chondrocalcinosis is present. There is significant atherosclerotic calcification of femoral artery  and its branches. No acute fracture. No joint effusion. IMPRESSION: 1. Degenerative changes. 2. No evidence for acute abnormality. Electronically Signed   By: Nolon Nations M.D.   On: 12/12/2018 09:35    EKG: Independently reviewed.  Atrial fibrillation, ventricular rate 90.  Assessment/Plan Principal Problem:   Chest pain Active Problems:   Hypertension   High cholesterol   GERD (gastroesophageal reflux disease)   CKD (chronic kidney disease), stage III ([facility])   CAD (coronary artery disease)   PAD (peripheral artery disease) ([place])   New onset a-fib ([place])     1.  Chest discomfort, symptomatic palpitation with new onset A. Fib: We will agree with monitoring in telemetry unit.  Currently rate controlled. Cycle troponins and EKG. Resume Plavix.  Will get echocardiogram. We will consult cardiology for further evaluation. He is followed by cardiology as outpatient.  2.  Hypertension: Well controlled.  Resume home medications.Patient is on amlodipine, he is not on any beta-blockers.  Will await for cardiology evaluation before starting other medications.  3.  Hyperlipidemia: On a statin well-tolerated.  4.  CKD stage III: Fairly stable.  5.  Coronary artery disease: On Plavix.  Continue.  Rule out acute coronary syndrome as above #1.    DVT prophylaxis: Lovenox Code Status: Full code Family Communication: Wife and son at the bedside Disposition Plan: Home with home health care Consults called: Cardiology Admission status: Observation   Barb Merino MD [facility] Pager [phone]  If 7PM-7AM, please contact night-coverage [email or web address] Password [email or web address]  12/12/2018, 1:39 PM

## 2018-12-12 NOTE — Progress Notes (Signed)
  Echocardiogram 2D Echocardiogram has been performed. Dr. Percival Spanish said to discontinue testing during short axis images.   Derrick [name] 12/12/2018, 4:07 PM

## 2018-12-12 NOTE — ED Provider Notes (Signed)
[facility] EMERGENCY DEPARTMENT Provider Note   CSN: [id number] Arrival date & time: 12/12/18  0093    History   Chief Complaint Chief Complaint  Patient presents with  . Atrial Fibrillation  . Knee Pain    HPI Derrick [name] is a 85 y.o. male.     HPI  85 year old male with past medical history as below here with chest pain.  Patient has mild memory loss so history is somewhat limited, though it is also provided with the help of his wife.  Per report, the patient has had some difficulty recovering from recent back surgery on 2/20.  He has had decreased appetite and decreased movement over the last several days since his return home.  He reports that earlier today, he began walking, then developed acute onset of severe, quivering sensation in his chest with associated chest pressure.  He felt short of breath.  He not break out sweat.  Per wife, he appeared to be in distress and was holding his chest.  The symptoms persisted after resting for several minutes, and have now resolved.  He does endorse generalized weakness as well as intermittent chest pain with exertion.  Denies any leg swelling.  He denies any increased pain, redness, or drainage from his operative site of his back.  No recent medication changes.  He is unsure whether he is on a blood thinner.  No other complaints.  Denies known history of DVTs or PE.  He has had a mild cough.  No hemoptysis.   Past Medical History:  Diagnosis Date  . Anemia   . Anxiety   . Arthritis, rheumatoid ([place])   . BPH (benign prostatic hyperplasia)   . CAD (coronary artery disease)   . CKD (chronic kidney disease)   . Depression   . Diverticulosis   . Elevated PSA   . Gangrene ([place])    of toe right foot  . GERD (gastroesophageal reflux disease)   . Herpes simplex labialis   . High cholesterol   . History of colonic polyps   . Hypertension   . Insomnia   . Lacunar infarction ([place])   . Memory loss   . Obesity   . Panic  attacks   . Prostate cancer ([place]) YRS AGO  . Renal cell cancer ([place])    s/p nephrectomy in 2000  . Seropositive rheumatoid arthritis ([place])   . Spermatocele   . Tinnitus   . Tremor of both hands     Patient Active Problem List   Diagnosis Date Noted  . Acute blood loss anemia 11/28/2018  . Tachycardia 11/27/2018  . Gross hematuria 11/27/2018  . Acute metabolic encephalopathy 81/82/9937  . Spinal stenosis, lumbar region with neurogenic claudication 11/26/2018  . Ulcer of right foot limited to breakdown of skin ([place])   . PAD (peripheral artery disease) ([place]) 09/23/2017  . Syncope and collapse 09/19/2017  . Osteomyelitis ([place])   . Malaise 09/09/2017  . Anorexia 09/09/2017  . Hypotension 09/09/2017  . Volume depletion 09/09/2017  . AKI (acute kidney injury) ([place]) 09/09/2017  . Hyponatremia 06/12/2017  . COPD (chronic obstructive pulmonary disease) ([place])   . Lacunar infarction ([place])   . Gangrene of toe of right foot ([place])   . Pre-syncope 11/21/2016  . Dyspnea on exertion 03/03/2016  . H/O unilateral Rt nephrectomy 03/03/2016  . Asymmetrical right sensorineural hearing loss 02/24/2016  . Bilateral hearing loss 02/24/2016  . Bilateral impacted cerumen 02/24/2016  . Arrhythmia 11/09/2015  . CAD (  coronary artery disease) 11/09/2015  . Abnormal ECG 10/22/2013  . Dyspnea 10/22/2013  . Chest pain at rest 11/18/2011  . Unstable angina ([place]) 11/18/2011  . Thrombocytopenia ([place]) 11/18/2011  . Anemia 11/18/2011  . CKD (chronic kidney disease), stage III ([place]) 11/18/2011  . Hypertension   . High cholesterol   . GERD (gastroesophageal reflux disease)   . BPH (benign prostatic hyperplasia)     Past Surgical History:  Procedure Laterality Date  . AMPUTATION TOE Right 06/13/2017   Procedure: AMPUTATION TOE 1st and 2nd;  Surgeon: [name], DPM;  Location: [facility];  Service: Podiatry;  Laterality: Right;  . AMPUTATION TOE     RIGHT FOOT ALL TOES GONE  . BACK SURGERY  YRS AGO    LOWER  . COLONOSCOPY    . GRAFT APPLICATION Right 2/70/3500   Procedure: EPIFIX GRAFT APPLICATION OF RIGHT FOOT;  Surgeon: [name], DPM;  Location: [facility];  Service: Podiatry;  Laterality: Right;  . I&D EXTREMITY Right 09/13/2017   Procedure: IRRIGATION AND DEBRIDEMENT AND BONE BIOPSY;  Surgeon: [name], DPM;  Location: [facility];  Service: Podiatry;  Laterality: Right;  . INCISION AND DRAINAGE OF WOUND Right 12/18/2017   Procedure: IRRIGATION AND DEBRIDEMENT WOUND ON RIGHT FOOT;  Surgeon: [name], DPM;  Location: [facility];  Service: Podiatry;  Laterality: Right;  . IR ANGIOGRAM EXTREMITY RIGHT  05/30/2017  . IR ANGIOGRAM EXTREMITY RIGHT  08/08/2017  . IR ANGIOGRAM SELECTIVE EACH ADDITIONAL VESSEL  08/08/2017  . IR ANGIOGRAM SELECTIVE EACH ADDITIONAL VESSEL  08/08/2017  . IR RADIOLOGIST EVAL & MGMT  05/16/2017  . IR TIB-PERO ART PTA MOD SED  05/30/2017  . IR TIB-PERO ART PTA MOD SED  08/08/2017  . IR TIB-PERO ART UNI PTA EA ADD VESSEL MOD SED  05/30/2017  . IR TIB-PERO ART UNI PTA EA ADD VESSEL MOD SED  08/08/2017  . IR US GUIDE VASC ACCESS RIGHT  05/30/2017  . IR US GUIDE VASC ACCESS RIGHT  08/08/2017  . LEFT HEART CATHETERIZATION WITH CORONARY ANGIOGRAM N/A 11/19/2011   Procedure: LEFT HEART CATHETERIZATION WITH CORONARY ANGIOGRAM;  Surgeon: [name], MD;  Location: [facility] CATH LAB;  Service: Cardiovascular;  Laterality: N/A;  . LUMBAR LAMINECTOMY/DECOMPRESSION MICRODISCECTOMY N/A 11/26/2018   Procedure: Lumbar central decompression L2-3, L3-4;  Surgeon: [name], MD;  Location: [facility];  Service: Orthopedics;  Laterality: N/A;  138min  . NEPHRECTOMY  10/1998  . TRANSMETATARSAL AMPUTATION Right 08/14/2017   Procedure: TRANSMETATARSAL AMPUTATION;  Surgeon: [name], DPM;  Location: [facility];  Service: Podiatry;  Laterality: Right;        Home Medications    Prior to Admission medications   Medication Sig Start Date End Date Taking? Authorizing Provider    acetaminophen (TYLENOL) 500 MG tablet Take 1,000 mg by mouth every 6 (six) hours as needed for moderate pain or headache.    [provider]  amLODipine (NORVASC) 5 MG tablet Take 1 tablet (5 mg total) by mouth daily. SCHEDULE OV FOR FURTHER REFILLS. Patient taking differently: Take 5 mg by mouth every evening. SCHEDULE OV FOR FURTHER REFILLS. 09/17/18   Croitoru, Mihai, MD  aspirin EC 81 MG tablet Take 81 mg by mouth at bedtime.     [provider]  cephALEXin (KEFLEX) 500 MG capsule Take 1 capsule (500 mg total) by mouth every 12 (twelve) hours. 12/03/18   Cecilio Asper, Amber, PA-C  clopidogrel (PLAVIX) 75 MG tablet Take 75 mg by mouth  daily.    [provider]  docusate sodium (COLACE) 100 MG capsule Take 100 mg by mouth daily.     [provider]  finasteride (PROSCAR) 5 MG tablet Take 5 mg by mouth every evening.     [provider]  gabapentin (NEURONTIN) 100 MG capsule Take 1 capsule (100 mg total) by mouth 3 (three) times daily. Patient taking differently: Take 100 mg by mouth at bedtime.  06/11/18   Mesner, Corene Cornea, MD  HYDROcodone-acetaminophen (NORCO/VICODIN) 5-325 MG tablet Take 1 tablet by mouth every 6 (six) hours as needed for moderate pain ((score 4 to 6)). 12/03/18   Cecilio Asper, Amber, PA-C  hydroxychloroquine (PLAQUENIL) 200 MG tablet Take 200 mg by mouth daily.    [provider]  methocarbamol (ROBAXIN) 500 MG tablet Take 1 tablet (500 mg total) by mouth every 6 (six) hours as needed for muscle spasms. 12/03/18   Cecilio Asper, Museum/gallery conservator, PA-C  Multiple Vitamin (MULTIVITAMIN WITH MINERALS) TABS tablet Take 1 tablet by mouth every evening.     [provider]  Omega-3-6-9 CAPS Take 2 capsules by mouth daily.    [provider]  pantoprazole (PROTONIX) 40 MG tablet Take 40 mg by mouth every evening.     [provider]  predniSONE (DELTASONE) 5 MG tablet Take 5 mg by mouth daily with breakfast.    [provider]  sertraline (ZOLOFT) 25 MG tablet Take 25 mg by mouth daily.    [provider]  traZODone (DESYREL) 50 MG tablet Take 50-100 mg by mouth at bedtime as needed for sleep.     [provider]    Family History Family History  Problem Relation Age of Onset  . Heart attack Father 40  . Stroke Father   . Kidney failure Sister        Bright's disease   . Cancer Paternal Aunt        d. 55s; NOS cancer of leg  . Kidney disease Sister        d. 60; "Bright's disease" - possible kidney cancer?  . Cancer Sister        NOS cancer; d. 16s; "sick in the hospital"  . Heart attack Son 12  . Leukemia Son 57  . COPD Son   . Breast cancer Daughter 34    Social History Social History   Tobacco Use  . Smoking status: Former Smoker    Packs/day: 1.00    Years: 29.00    Pack years: 29.00    Types: Cigarettes    Last attempt to quit: 10/08/1978    Years since quitting: 40.2  . Smokeless tobacco: Never Used  . Tobacco comment: tried smokeless tobacco once but made him sick  Substance Use Topics  . Alcohol use: No  . Drug use: No     Allergies   Patient has no known allergies.   Review of Systems Review of Systems  Constitutional: Positive for fatigue. Negative for chills and fever.  HENT: Negative for congestion and rhinorrhea.   Eyes: Negative for visual disturbance.  Respiratory: Positive for cough, chest tightness and shortness of breath. Negative for wheezing.   Cardiovascular: Positive for chest pain. Negative for leg swelling.  Gastrointestinal: Negative for abdominal pain, diarrhea, nausea and vomiting.  Genitourinary: Negative for dysuria and flank pain.  Musculoskeletal: Positive for back pain. Negative for neck pain and neck stiffness.  Skin: Negative for rash and wound.  Allergic/Immunologic: Negative for immunocompromised state.  Neurological: Positive for weakness.  Negative for syncope and headaches.  Psychiatric/Behavioral: Positive for confusion.    All other systems reviewed and are negative.    Physical Exam Updated Vital Signs There were no vitals taken for this visit.  Physical Exam Vitals signs and nursing note reviewed.  Constitutional:      General: He is not in acute distress.    Appearance: He is well-developed.  HENT:     Head: Normocephalic and atraumatic.  Eyes:     Conjunctiva/sclera: Conjunctivae normal.  Neck:     Musculoskeletal: Neck supple.  Cardiovascular:     Rate and Rhythm: Tachycardia present. Rhythm irregular.     Heart sounds: Normal heart sounds. No murmur. No friction rub.  Pulmonary:     Effort: Pulmonary effort is normal. No respiratory distress.     Breath sounds: Rales (bibasilar) present. No wheezing.  Abdominal:     General: There is no distension.     Palpations: Abdomen is soft.     Tenderness: There is no abdominal tenderness.  Musculoskeletal:     Comments: S/p right distal foot amputation, incision c/d/i. Left knee with mild diffuse bony TTP. No over leg asymmetry/edema.  Skin:    General: Skin is warm.     Capillary Refill: Capillary refill takes less than 2 seconds.     Comments: Midline surgical site c/d/i. Mild erythema of wound edges around suture sites.  Neurological:     Mental Status: He is alert and oriented to person, place, and time.     Motor: No abnormal muscle tone.      ED Treatments / Results  Labs (all labs ordered are listed, but only abnormal results are displayed) Labs Reviewed  BASIC METABOLIC PANEL  CBC  TROPONIN I  BRAIN NATRIURETIC PEPTIDE    EKG None   EKG: AFib. VR 103, QRS 107, QTc 483.  Since last EKG, no significant change compared to 11/27/18. No ST elevations or depressions. Non-specific ST-t changes unchanged.  Radiology No results found.  Procedures Procedures (including critical care time)  Medications Ordered in ED Medications  sodium chloride flush (NS) 0.9 % injection 3 mL (has no administration in time range)      Initial Impression / Assessment and Plan / ED Course  I have reviewed the triage vital signs and the nursing notes.  Pertinent labs & imaging results that were available during my care of the patient were reviewed by me and considered in my medical decision making (see chart for details).        85 year old male here with chest pain and shortness of breath with exertion.  I suspect this is multifactorial secondary to exacerbation of his A. fib, now intermittently in RVR, likely due to dehydration as well as pain.  Patient given dose of IV Lopressor and analgesics here.  His bicarb is decreased which I suspect is dehydration and has been given gentle fluids.  Following this, patient continues to be markedly tachycardic with any kind of exertion, with a dull chest pressure.  Will I do not suspect this is ACS, given his multiple comorbidities and persistent, recurrent A. fib RVR with exertion, will admit for gentle hydration and observation.  Recent surgical site c/d/i. No leg swelling of L knee, suspect his pain is 2/2 his known radiculopathy.  Final Clinical Impressions(s) / ED Diagnoses   Final diagnoses:  None    ED Discharge Orders    None       Duffy Bruce, MD 12/12/18 1301

## 2018-12-12 NOTE — ED Notes (Signed)
Hospitalist at bedside 

## 2018-12-12 NOTE — ED Triage Notes (Addendum)
Pt here for evaluation of a-fib that he states started this morning. Pt called EMS for "heart racing" and was found to be in a-fib. States he feels his heart go into this rhythm 3-4 times per month. Jittery, nervous, and anxious for EMS and this RN.  Denies CP/SOB. HR 90s-130s. Also c/o left knee pain. Denies fall/injuries/trauma. Given 163mcg fentanyl for knee pain by EMS.   EMS vitals 150/100 BP 18 RR 97% RA

## 2018-12-12 NOTE — Progress Notes (Signed)
Bilateral lower extremity venous duplex has been completed. Preliminary results can be found in CV Proc through chart review.  Results were given to Dr. Ellender Hose.   12/12/18 1:49 PM Derrick [name] RVT

## 2018-12-12 NOTE — ED Notes (Signed)
Pt was unable to stand during orthostatics. pt stated that the pain was unbearable and needed to sit back down. Pt heart rate was about 130s while standing. Pt is asking for pain meds at this time. Tech notified [place], Therapist, sports.

## 2018-12-12 NOTE — Progress Notes (Signed)
Patient is still combative and trying to climb out of bed. The patients wife has been called and made aware of patient's change in behavior. Patient has been move to a room closer to the nurse's station. Bed in lowest position with bed alarm on and floor mats. Nurse tech at bedside for patient safety. Spouse and son are coming to hospital to try and calm patient. New order for restraints given, but have not been applied at this time. Patient would not allow the placement of yellow socks.

## 2018-12-12 NOTE — ED Notes (Signed)
ED TO INPATIENT HANDOFF REPORT  ED Nurse Name and Phone #:  Percell Locus, RN, Sharyn Lull, RN  S Name/Age/Gender Derrick [name] 85 y.o. male Room/Bed: 029C/029C  Code Status   Code Status: Prior  Home/SNF/Other Home Patient oriented to: self, place, time and situation Is this baseline? Yes   Triage Complete: Triage complete  Chief Complaint afib  Triage Note Pt here for evaluation of a-fib that he states started this morning. Pt called EMS for "heart racing" and was found to be in a-fib. States he feels his heart go into this rhythm 3-4 times per month. Jittery, nervous, and anxious for EMS and this RN.  Denies CP/SOB. HR 90s-130s. Also c/o left knee pain. Denies fall/injuries/trauma. Given 128mcg fentanyl for knee pain by EMS.   EMS vitals 150/100 BP 18 RR 97% RA   Allergies No Known Allergies  Level of Care/Admitting Diagnosis ED Disposition    ED Disposition Condition Comment   Admit  Hospital Area: [place] [[id number]]  Level of Care: Cardiac Telemetry [103]  I expect the patient will be discharged within 24 hours: No (not a candidate for 5C-Observation unit)  Diagnosis: Chest pain [321224]  Admitting Physician: Barb Merino [[id number]]  Attending Physician: Barb Merino [[id number]]  PT Class (Do Not Modify): Observation [104]  PT Acc Code (Do Not Modify): Observation [10022]       B Medical/Surgery History Past Medical History:  Diagnosis Date  . Anemia   . Anxiety   . Arthritis, rheumatoid ([place])   . BPH (benign prostatic hyperplasia)   . CAD (coronary artery disease)   . CKD (chronic kidney disease)   . Depression   . Diverticulosis   . Elevated PSA   . Gangrene ([place])    of toe right foot  . GERD (gastroesophageal reflux disease)   . Herpes simplex labialis   . High cholesterol   . History of colonic polyps   . Hypertension   . Insomnia   . Lacunar infarction ([place])   . Memory loss   . Obesity   . Panic attacks   . Prostate cancer  ([place]) YRS AGO  . Renal cell cancer ([place])    s/p nephrectomy in 2000  . Seropositive rheumatoid arthritis ([place])   . Spermatocele   . Tinnitus   . Tremor of both hands    Past Surgical History:  Procedure Laterality Date  . AMPUTATION TOE Right 06/13/2017   Procedure: AMPUTATION TOE 1st and 2nd;  Surgeon: [name], DPM;  Location: [place];  Service: Podiatry;  Laterality: Right;  . AMPUTATION TOE     RIGHT FOOT ALL TOES GONE  . BACK SURGERY  YRS AGO   LOWER  . COLONOSCOPY    . GRAFT APPLICATION Right 6/94/5038   Procedure: EPIFIX GRAFT APPLICATION OF RIGHT FOOT;  Surgeon: [name], DPM;  Location: [place];  Service: Podiatry;  Laterality: Right;  . I&D EXTREMITY Right 09/13/2017   Procedure: IRRIGATION AND DEBRIDEMENT AND BONE BIOPSY;  Surgeon: [name], DPM;  Location: [facility];  Service: Podiatry;  Laterality: Right;  . INCISION AND DRAINAGE OF WOUND Right 12/18/2017   Procedure: IRRIGATION AND DEBRIDEMENT WOUND ON RIGHT FOOT;  Surgeon: [name], DPM;  Location: [place];  Service: Podiatry;  Laterality: Right;  . IR ANGIOGRAM EXTREMITY RIGHT  05/30/2017  . IR ANGIOGRAM EXTREMITY RIGHT  08/08/2017  . IR ANGIOGRAM SELECTIVE EACH ADDITIONAL VESSEL  08/08/2017  . IR ANGIOGRAM SELECTIVE EACH ADDITIONAL VESSEL  08/08/2017  . IR RADIOLOGIST EVAL & MGMT  05/16/2017  . IR TIB-PERO ART PTA MOD SED  05/30/2017  . IR TIB-PERO ART PTA MOD SED  08/08/2017  . IR TIB-PERO ART UNI PTA EA ADD VESSEL MOD SED  05/30/2017  . IR TIB-PERO ART UNI PTA EA ADD VESSEL MOD SED  08/08/2017  . IR US GUIDE VASC ACCESS RIGHT  05/30/2017  . IR US GUIDE VASC ACCESS RIGHT  08/08/2017  . LEFT HEART CATHETERIZATION WITH CORONARY ANGIOGRAM N/A 11/19/2011   Procedure: LEFT HEART CATHETERIZATION WITH CORONARY ANGIOGRAM;  Surgeon: [name], MD;  Location: [facility];  Service: Cardiovascular;  Laterality: N/A;  . LUMBAR LAMINECTOMY/DECOMPRESSION MICRODISCECTOMY N/A 11/26/2018   Procedure: Lumbar central  decompression L2-3, L3-4;  Surgeon: [name], MD;  Location: [facility];  Service: Orthopedics;  Laterality: N/A;  172min  . NEPHRECTOMY  10/1998  . TRANSMETATARSAL AMPUTATION Right 08/14/2017   Procedure: TRANSMETATARSAL AMPUTATION;  Surgeon: [name], DPM;  Location: [place];  Service: Podiatry;  Laterality: Right;     A IV Location/Drains/Wounds Patient Lines/Drains/Airways Status   Active Line/Drains/Airways    Name:   Placement date:   Placement time:   Site:   Days:   Peripheral IV 12/12/18 Left Hand   12/12/18    1000    Hand   less than 1   Incision (Closed) 11/26/18 Back Other (Comment)   11/26/18    1215     16          Intake/Output Last 24 hours No intake or output data in the 24 hours ending 12/12/18 1406  Labs/Imaging Results for orders placed or performed during the hospital encounter of 12/12/18 (from the past 48 hour(s))  Basic metabolic panel     Status: Abnormal   Collection Time: 12/12/18  8:43 AM  Result Value Ref Range   Sodium 133 (L) 135 - 145 mmol/L   Potassium 3.6 3.5 - 5.1 mmol/L   Chloride 105 98 - 111 mmol/L   CO2 17 (L) 22 - 32 mmol/L   Glucose, Bld 118 (H) 70 - 99 mg/dL   BUN 10 8 - 23 mg/dL   Creatinine, Ser 1.16 0.61 - 1.24 mg/dL   Calcium 9.8 8.9 - 10.3 mg/dL   GFR calc non Af Amer 58 (L) >60 mL/min   GFR calc Af Amer >60 >60 mL/min   Anion gap 11 5 - 15    Comment: Performed at [facility], [place] [address], [place], [place] [address]  CBC     Status: None   Collection Time: 12/12/18  8:43 AM  Result Value Ref Range   WBC 10.3 4.0 - 10.5 K/uL   RBC 4.77 4.22 - 5.81 MIL/uL   Hemoglobin 13.8 13.0 - 17.0 g/dL   HCT 41.2 39.0 - 52.0 %   MCV 86.4 80.0 - 100.0 fL   MCH 28.9 26.0 - 34.0 pg   MCHC 33.5 30.0 - 36.0 g/dL   RDW 15.0 11.5 - 15.5 %   Platelets 341 150 - 400 K/uL   nRBC 0.0 0.0 - 0.2 %    Comment: Performed at [facility], [place] [address] [place], [place] [address]  Troponin I - ONCE - STAT     Status: None    Collection Time: 12/12/18  8:43 AM  Result Value Ref Range   Troponin I <0.03 <0.03 ng/mL    Comment: Performed at [facility]  [address], [place], [place] [address]  Brain natriuretic peptide     Status: None   Collection Time: 12/12/18  8:43 AM  Result Value Ref Range   B Natriuretic Peptide 69.1 0.0 - 100.0 pg/mL    Comment: Performed at [facility] [address] [place], [place] [address]  Sedimentation rate     Status: None   Collection Time: 12/12/18 11:25 AM  Result Value Ref Range   Sed Rate 7 0 - 16 mm/hr  C-reactive protein     Status: None   Collection Time: 12/12/18 11:25 AM  Result Value Ref Range   CRP <0.8 <1.0 mg/dL    Comment: Performed at [facility], [place] [address] [place], [place] [address]   Dg Chest 2 View  Result Date: 12/12/2018 CLINICAL DATA:  A fib  Chest  Pain   Chronic  Knee pain  No  injury EXAM: CHEST - 2 VIEW COMPARISON:  09/19/2017 FINDINGS: Stable elevation of the LEFT hemidiaphragm. Tortuous aorta. Heart size is normal. Lungs are free of focal consolidations and pleural effusions. No pulmonary edema. There is midthoracic spondylosis. IMPRESSION: No evidence for acute cardiopulmonary abnormality. Electronically Signed   By: Nolon Nations M.D.   On: 12/12/2018 09:34   Dg Knee Complete 4 Views Left  Result Date: 12/12/2018 CLINICAL DATA:  A fib  Chest  Pain   Chronic  Knee pain  No  injury EXAM: LEFT KNEE - COMPLETE 4+ VIEW COMPARISON:  10/05/2018 FINDINGS: There are mild degenerative changes in the MEDIAL of the LATERAL compartments. Chondrocalcinosis is present. There is significant atherosclerotic calcification of femoral artery and its branches. No acute fracture. No joint effusion. IMPRESSION: 1. Degenerative changes. 2. No evidence for acute abnormality. Electronically Signed   By: Nolon Nations M.D.   On: 12/12/2018 09:35   Vas [place] Lower Extremity Venous (dvt) (only [facility])  Result Date: 12/12/2018  Lower Venous  Study Indications: Edema.  Performing Technologist: Oliver Hum RVT  Examination Guidelines: A complete evaluation includes B-mode imaging, spectral Doppler, color Doppler, and power Doppler as needed of all accessible portions of each vessel. Bilateral testing is considered an integral part of a complete examination. Limited examinations for reoccurring indications may be performed as noted.  Right Venous Findings: +---------+---------------+---------+-----------+----------+-------+          CompressibilityPhasicitySpontaneityPropertiesSummary +---------+---------------+---------+-----------+----------+-------+ CFV      Full           Yes      Yes                          +---------+---------------+---------+-----------+----------+-------+ SFJ      Full                                                 +---------+---------------+---------+-----------+----------+-------+ FV Prox  Full                                                 +---------+---------------+---------+-----------+----------+-------+ FV Mid   Full                                                 +---------+---------------+---------+-----------+----------+-------+  FV DistalFull                                                 +---------+---------------+---------+-----------+----------+-------+ PFV      Full                                                 +---------+---------------+---------+-----------+----------+-------+ POP      Full           Yes      Yes                          +---------+---------------+---------+-----------+----------+-------+ PTV      Full                                                 +---------+---------------+---------+-----------+----------+-------+ PERO     Full                                                 +---------+---------------+---------+-----------+----------+-------+  Left Venous Findings:  +---------+---------------+---------+-----------+----------+-------+          CompressibilityPhasicitySpontaneityPropertiesSummary +---------+---------------+---------+-----------+----------+-------+ CFV      Full           Yes      Yes                          +---------+---------------+---------+-----------+----------+-------+ SFJ      Full                                                 +---------+---------------+---------+-----------+----------+-------+ FV Prox  Full                                                 +---------+---------------+---------+-----------+----------+-------+ FV Mid   Full                                                 +---------+---------------+---------+-----------+----------+-------+ FV DistalFull                                                 +---------+---------------+---------+-----------+----------+-------+ PFV      Full                                                 +---------+---------------+---------+-----------+----------+-------+  POP      Full           Yes      Yes                          +---------+---------------+---------+-----------+----------+-------+ PTV      Full                                                 +---------+---------------+---------+-----------+----------+-------+ PERO     Full                                                 +---------+---------------+---------+-----------+----------+-------+    Summary: Right: There is no evidence of deep vein thrombosis in the lower extremity. No cystic structure found in the popliteal fossa. Left: There is no evidence of deep vein thrombosis in the lower extremity. No cystic structure found in the popliteal fossa.  *See table(s) above for measurements and observations.    Preliminary     Pending Labs Unresulted Labs (From admission, onward)    Start     Ordered   12/12/18 1328  Troponin I - Now Then Q6H  Now then every 6 hours,   R      12/12/18 1327          Vitals/Pain Today's Vitals   12/12/18 1140 12/12/18 1200 12/12/18 1230 12/12/18 1301  BP:  131/79 (!) 131/92 (!) 151/132  Pulse:   (!) 55 86  Resp:   14 14  SpO2:   95% 98%  PainSc: 3        Isolation Precautions No active isolations  Medications Medications  sodium chloride flush (NS) 0.9 % injection 3 mL (3 mLs Intravenous Given 12/12/18 1108)  morphine 4 MG/ML injection 4 mg (4 mg Intravenous Given 12/12/18 1103)  sodium chloride 0.9 % bolus 500 mL (500 mLs Intravenous New Bag/Given 12/12/18 1114)  0.9 %  sodium chloride infusion ( Intravenous New Bag/Given 12/12/18 1116)  metoprolol tartrate (LOPRESSOR) injection 2.5 mg (2.5 mg Intravenous Given 12/12/18 1136)    Mobility walks with person assist High fall risk   Focused Assessments Cardiac Assessment Handoff:  Cardiac Rhythm: Atrial fibrillation Lab Results  Component Value Date   CKTOTAL 45 (L) 09/13/2017   CKMB 2.6 11/19/2011   TROPONINI <0.03 12/12/2018   No results found for: DDIMER Does the Patient currently have chest pain? No     R Recommendations: See Admitting Provider Note  Report given to:   Additional Notes:  Pt unsteady while doing orthostatic VS; recommend 1-2 person assist.

## 2018-12-12 NOTE — Consult Note (Addendum)
Cardiology Consultation:   [name] ID: Derrick [name] MRN: [id number]; DOB: [birth year]  Admit date: 12/12/2018 Date of Consult: 12/12/2018  Primary Care Provider: [name], MD Primary Cardiologist: Sanda Klein, MD  Primary Electrophysiologist:  None   [name] Profile:   Derrick [name] is a 85 y.o. male with a hx of CAD, CKD stage III, s/p nephrectomy for renal cell carcinoma hypertension, hyperlipidemia, rheumatoid arthritis, right transmetatarsal amputation, recent lumbar spine surgery for radiculopathy who is being seen today for Derrick evaluation of atrial fibrillation and chest pain at Derrick request of Dr. Sloan Leiter.  History of Present Illness:   Derrick [name] is seen by Croitoru for hypertension, hypercholesterolemia, moderate chronic renal insufficiency, s/p nephrectomy for renal cell carcinoma, transmetatarsal amputation of Derrick right foot, with osteomyelitis. He was unable to tolerate diuretics due to hypotension and syncope. He has an abnormal electrocardiogram and nonobstructive coronary artery disease (angiography 2013 showed 40-50% mid RCA, 40% proximal LAD). Echo in 09/2017 showed left ventricular ejection fraction of 55-60% and grade 1 diastolic dysfunction.   Derrick [name] presented to Derrick emergency department today with complainted of palpitations and chest discomfort this morning.  Derrick [name] underwent spinal surgery approximately 2 weeks ago and is still having left leg and back pain issues.  Derrick [name] saw his orthopedist a few days ago and was prescribed prednisone.  Apparently after his taking his morning prednisone he began to feel jittery and complained of a fast heartbeat and left-sided chest pain.    Derrick [name] is not a very good historian.  His son helps to fill in some of Derrick information.  Derrick [name] initially told me that he came to [facility] hospital because of his significant left leg pain.  His son says that actually this morning after he had taken his prednisone he began to hold his  left chest and complained of his heart beating very fast.  Derrick [name] says that he was having left-sided chest pain.  Derrick [name] has chronic mild dyspnea on exertion which is no worse recently.  He has not been active since his surgery due to Derrick pain.  He has had no orthopnea, PND or edema.  Derrick [name]'s son says that Derrick [name] went to [name] home briefly after his surgery and then was taken out by his wife.  Upon leaving [facility] Derrick [name] was given pill packs.  Derrick [name]'s son assumed that there was pain medication in Derrick pill packs but apparently there was not.  His son feels that uncontrolled pain is Derrick driver of his current complaints.  In Derrick ED Derrick [name] was given pain medicine which made him feel significantly better and with resolution of his chest pain.  He has had no further chest pain since then.  Derrick [name] reportedly had a heart rate of 80-130 reported by EMS, irregular.  EMS strips are difficult to read due to bumpy baseline.  On my evaluation of Derrick EKG which had an automated reading of atrial fibrillation, I see distinct P waves.  It appears irregular due to PACs.  This is consistent with prior EKGs on this [name].  Past Medical History:  Diagnosis Date  . Anemia   . Anxiety   . Arthritis, rheumatoid ([place])   . BPH (benign prostatic hyperplasia)   . CAD (coronary artery disease)   . CKD (chronic kidney disease)   . Depression   . Diverticulosis   . Elevated PSA   . Gangrene ([facility])    of toe  right foot  . GERD (gastroesophageal reflux disease)   . Herpes simplex labialis   . High cholesterol   . History of colonic polyps   . Hypertension   . Insomnia   . Lacunar infarction ([place])   . Memory loss   . Obesity   . Panic attacks   . Prostate cancer ([place]) YRS AGO  . Renal cell cancer ([place])    s/p nephrectomy in 2000  . Seropositive rheumatoid arthritis ([place])   . Spermatocele   . Tinnitus   . Tremor of both hands     Past Surgical History:    Procedure Laterality Date  . AMPUTATION TOE Right 06/13/2017   Procedure: AMPUTATION TOE 1st and 2nd;  Surgeon: [name], DPM;  Location: [facility];  Service: Podiatry;  Laterality: Right;  . AMPUTATION TOE     RIGHT FOOT ALL TOES GONE  . BACK SURGERY  YRS AGO   LOWER  . COLONOSCOPY    . GRAFT APPLICATION Right 1/88/4166   Procedure: EPIFIX GRAFT APPLICATION OF RIGHT FOOT;  Surgeon: [name], DPM;  Location: [facility];  Service: Podiatry;  Laterality: Right;  . I&D EXTREMITY Right 09/13/2017   Procedure: IRRIGATION AND DEBRIDEMENT AND BONE BIOPSY;  Surgeon: [name], DPM;  Location: [facility];  Service: Podiatry;  Laterality: Right;  . INCISION AND DRAINAGE OF WOUND Right 12/18/2017   Procedure: IRRIGATION AND DEBRIDEMENT WOUND ON RIGHT FOOT;  Surgeon: [name], DPM;  Location: [facility];  Service: Podiatry;  Laterality: Right;  . IR ANGIOGRAM EXTREMITY RIGHT  05/30/2017  . IR ANGIOGRAM EXTREMITY RIGHT  08/08/2017  . IR ANGIOGRAM SELECTIVE EACH ADDITIONAL VESSEL  08/08/2017  . IR ANGIOGRAM SELECTIVE EACH ADDITIONAL VESSEL  08/08/2017  . IR RADIOLOGIST EVAL & MGMT  05/16/2017  . IR TIB-PERO ART PTA MOD SED  05/30/2017  . IR TIB-PERO ART PTA MOD SED  08/08/2017  . IR TIB-PERO ART UNI PTA EA ADD VESSEL MOD SED  05/30/2017  . IR TIB-PERO ART UNI PTA EA ADD VESSEL MOD SED  08/08/2017  . IR US GUIDE VASC ACCESS RIGHT  05/30/2017  . IR US GUIDE VASC ACCESS RIGHT  08/08/2017  . LEFT HEART CATHETERIZATION WITH CORONARY ANGIOGRAM N/A 11/19/2011   Procedure: LEFT HEART CATHETERIZATION WITH CORONARY ANGIOGRAM;  Surgeon: [name], MD;  [facility];  Service: Cardiovascular;  Laterality: N/A;  . LUMBAR LAMINECTOMY/DECOMPRESSION MICRODISCECTOMY N/A 11/26/2018   Procedure: Lumbar central decompression L2-3, L3-4;  Surgeon: [name], MD;  Location: [facility];  Service: Orthopedics;  Laterality: N/A;  118min  . NEPHRECTOMY  10/1998  . TRANSMETATARSAL AMPUTATION Right 08/14/2017    Procedure: TRANSMETATARSAL AMPUTATION;  Surgeon: [name], DPM;  Location: [facility];  Service: Podiatry;  Laterality: Right;     Home Medications:  Prior to Admission medications   Medication Sig Start Date End Date Taking? Authorizing Provider  acetaminophen (TYLENOL) 500 MG tablet Take 1,000 mg by mouth every 6 (six) hours as needed for moderate pain or headache.   Yes [provider]  amLODipine (NORVASC) 5 MG tablet Take 1 tablet (5 mg total) by mouth daily. SCHEDULE OV FOR FURTHER REFILLS. 09/17/18  Yes Croitoru, Mihai, MD  clopidogrel (PLAVIX) 75 MG tablet Take 75 mg by mouth daily.   Yes [provider]  docusate sodium (COLACE) 100 MG capsule Take 100 mg by mouth daily.    Yes [provider]  finasteride (PROSCAR) 5 MG tablet Take 5  mg by mouth every evening.    Yes [provider]  gabapentin (NEURONTIN) 100 MG capsule Take 1 capsule (100 mg total) by mouth 3 (three) times daily. [name] taking differently: Take 100 mg by mouth at bedtime.  06/11/18  Yes Mesner, Corene Cornea, MD  hydroxychloroquine (PLAQUENIL) 200 MG tablet Take 200 mg by mouth daily.   Yes [provider]  methocarbamol (ROBAXIN) 500 MG tablet Take 1 tablet (500 mg total) by mouth every 6 (six) hours as needed for muscle spasms. 12/03/18  Yes Constable, Museum/gallery conservator, PA-C  Multiple Vitamin (MULTIVITAMIN WITH MINERALS) TABS tablet Take 1 tablet by mouth every evening.    Yes [provider]  Omega-3-6-9 CAPS Take 2 capsules by mouth daily.   Yes [provider]  pantoprazole (PROTONIX) 40 MG tablet Take 40 mg by mouth every evening.    Yes [provider]  predniSONE (DELTASONE) 5 MG tablet Take 5 mg by mouth daily with breakfast.   Yes [provider]  sertraline (ZOLOFT) 25 MG tablet Take 25 mg by mouth daily.   Yes [provider]  traZODone (DESYREL) 50 MG tablet Take 50-100 mg by mouth at bedtime as needed for sleep.    Yes [provider]  HYDROcodone-acetaminophen (NORCO/VICODIN) 5-325 MG tablet Take 1 tablet by mouth every 6 (six) hours as needed for moderate pain ((score 4 to 6)). [name] not taking: Reported on 12/12/2018 12/03/18   Ardeen Jourdain, PA-C    Inpatient Medications: Scheduled Meds: . amLODipine  5 mg Oral Daily  . clopidogrel  75 mg Oral Daily  . docusate sodium  100 mg Oral Daily  . enoxaparin (LOVENOX) injection  40 mg Subcutaneous Q24H  . finasteride  5 mg Oral QPM  . gabapentin  100 mg Oral QHS  . hydroxychloroquine  200 mg Oral Daily  . multivitamin with minerals  1 tablet Oral QPM  . omega-3 acid ethyl esters  1 g Oral BID  . pantoprazole  40 mg Oral QPM  . predniSONE  5 mg Oral Q breakfast  . sertraline  25 mg Oral Daily   Continuous Infusions:  PRN Meds: acetaminophen, methocarbamol, traZODone  Allergies:   No Known Allergies  Social History:   Social History   Socioeconomic History  . Marital status: Married    Spouse name: Not on file  . Number of children: Not on file  . Years of education: Not on file  . Highest education level: Not on file  Occupational History  . Not on file  Social Needs  . Financial resource strain: Not on file  . Food insecurity:    Worry: Not on file    Inability: Not on file  . Transportation needs:    Medical: Not on file    Non-medical: Not on file  Tobacco Use  . Smoking status: Former Smoker    Packs/day: 1.00    Years: 29.00    Pack years: 29.00    Types: Cigarettes    Last attempt to quit: 10/08/1978    Years since quitting: 40.2  . Smokeless tobacco: Never Used  . Tobacco comment: tried smokeless tobacco once but made him sick  Substance and Sexual Activity  . Alcohol use: No  . Drug use: No  . Sexual activity: Not on file  Lifestyle  . Physical activity:    Days per week: Not on file    Minutes per session: Not on file  . Stress: Not on file  Relationships  .  Social connections:    Talks on phone: Not on file     Gets together: Not on file    Attends religious service: Not on file    Active member of club or organization: Not on file    Attends meetings of clubs or organizations: Not on file    Relationship status: Not on file  . Intimate partner violence:    Fear of current or ex partner: Not on file    Emotionally abused: Not on file    Physically abused: Not on file    Forced sexual activity: Not on file  Other Topics Concern  . Not on file  Social History Narrative   Retired   Married   4 children          Family History:    Family History  Problem Relation Age of Onset  . Heart attack Father 52  . Stroke Father   . Kidney failure Sister        Bright's disease   . Cancer Paternal Aunt        d. 65s; NOS cancer of leg  . Kidney disease Sister        d. 43; "Bright's disease" - possible kidney cancer?  . Cancer Sister        NOS cancer; d. 33s; "sick in [facility] hospital"  . Heart attack Son 24  . Leukemia Son 11  . COPD Son   . Breast cancer Daughter 10     ROS:  Please see Derrick history of present illness.   All other ROS reviewed and negative.     Physical Exam/Data:   Vitals:   12/12/18 1230 12/12/18 1301 12/12/18 1400 12/12/18 1442  BP: (!) 131/92 (!) 151/132 137/74   Pulse: (!) 55 86 (!) 59 (!) 53  Resp: 14 14 18 16   Temp:    (!) 97.4 F (36.3 C)  TempSrc:    Oral  SpO2: 95% 98% 97% 98%  Weight:    73.2 kg  Height:    5\' 10"  (1.778 m)    Intake/Output Summary (Last 24 hours) at 12/12/2018 1600 Last data filed at 12/12/2018 1515 Gross per 24 hour  Intake 644.76 ml  Output -  Net 644.76 ml   Last 3 Weights 12/12/2018 11/26/2018 11/24/2018  Weight (lbs) 161 lb 6 oz 172 lb 172 lb  Weight (kg) 73.2 kg 78.019 kg 78.019 kg     Body mass index is 23.16 kg/m.  General:  Well nourished, well developed, in no acute distress HEENT: normal Lymph: no adenopathy Neck: no JVD Endocrine:  No thryomegaly Vascular: No carotid bruits; FA pulses 2+ bilaterally without bruits    Cardiac:  normal S1, S2; regularly irregular rhythm; 3/6 harsh systolic murmur best heard at LUSB Lungs:  clear to auscultation bilaterally, no wheezing, rhonchi or rales  Abd: soft, nontender, no hepatomegaly  Ext: no edema Musculoskeletal:  No deformities, BUE and BLE strength normal and equal Skin: warm and dry  Neuro:  CNs 2-12 intact, no focal abnormalities noted Psych:  Normal affect   EKG:  Derrick EKG was personally reviewed and demonstrates: Sinus rhythm with PACs Telemetry:  Telemetry was personally reviewed and demonstrates: Sinus rhythm with frequent PACs  Relevant CV Studies:  Echocardiogram 09/20/2017 Left ventricle: Derrick cavity size was normal. Wall thickness was normal. Systolic function was normal. Derrick estimated ejection fraction was in Derrick range of 55% to 60%. Wall motion was normal; there were no regional wall motion abnormalities. Doppler  parameters are consistent with abnormal left ventricular relaxation (grade 1 diastolic dysfunction). - Aortic valve: Trileaflet; mildly thickened, mildly calcified leaflets. - Mitral valve: There was mild regurgitation.  Laboratory Data:  Chemistry Recent Labs  Lab 12/12/18 0843  NA 133*  K 3.6  CL 105  CO2 17*  GLUCOSE 118*  BUN 10  CREATININE 1.16  CALCIUM 9.8  GFRNONAA 58*  GFRAA >60  ANIONGAP 11    No results for input(s): PROT, ALBUMIN, AST, ALT, ALKPHOS, BILITOT in Derrick last 168 hours. Hematology Recent Labs  Lab 12/12/18 0843  WBC 10.3  RBC 4.77  HGB 13.8  HCT 41.2  MCV 86.4  MCH 28.9  MCHC 33.5  RDW 15.0  PLT 341   Cardiac Enzymes Recent Labs  Lab 12/12/18 0843  TROPONINI <0.03   No results for input(s): TROPIPOC in Derrick last 168 hours.  BNP Recent Labs  Lab 12/12/18 0843  BNP 69.1    DDimer No results for input(s): DDIMER in Derrick last 168 hours.  Radiology/Studies:  Dg Chest 2 View  Result Date: 12/12/2018 CLINICAL DATA:  A fib  Chest  Pain   Chronic  Knee pain  No  injury  EXAM: CHEST - 2 VIEW COMPARISON:  09/19/2017 FINDINGS: Stable elevation of Derrick LEFT hemidiaphragm. Tortuous aorta. Heart size is normal. Lungs are free of focal consolidations and pleural effusions. No pulmonary edema. There is midthoracic spondylosis. IMPRESSION: No evidence for acute cardiopulmonary abnormality. Electronically Signed   By: Nolon Nations M.D.   On: 12/12/2018 09:34   Dg Knee Complete 4 Views Left  Result Date: 12/12/2018 CLINICAL DATA:  A fib  Chest  Pain   Chronic  Knee pain  No  injury EXAM: LEFT KNEE - COMPLETE 4+ VIEW COMPARISON:  10/05/2018 FINDINGS: There are mild degenerative changes in Derrick MEDIAL of Derrick LATERAL compartments. Chondrocalcinosis is present. There is significant atherosclerotic calcification of femoral artery and its branches. No acute fracture. No joint effusion. IMPRESSION: 1. Degenerative changes. 2. No evidence for acute abnormality. Electronically Signed   By: Nolon Nations M.D.   On: 12/12/2018 09:35   Vas [place] Lower Extremity Venous (dvt) (only [facility])  Result Date: 12/12/2018  Lower Venous Study Indications: Edema.  Performing Technologist: Oliver Hum RVT  Examination Guidelines: A complete evaluation includes B-mode imaging, spectral Doppler, color Doppler, and power Doppler as needed of all accessible portions of each vessel. Bilateral testing is considered an integral part of a complete examination. Limited examinations for reoccurring indications may be performed as noted.  Right Venous Findings: +---------+---------------+---------+-----------+----------+-------+          CompressibilityPhasicitySpontaneityPropertiesSummary +---------+---------------+---------+-----------+----------+-------+ CFV      Full           Yes      Yes                          +---------+---------------+---------+-----------+----------+-------+ SFJ      Full                                                  +---------+---------------+---------+-----------+----------+-------+ FV Prox  Full                                                 +---------+---------------+---------+-----------+----------+-------+  FV Mid   Full                                                 +---------+---------------+---------+-----------+----------+-------+ FV DistalFull                                                 +---------+---------------+---------+-----------+----------+-------+ PFV      Full                                                 +---------+---------------+---------+-----------+----------+-------+ POP      Full           Yes      Yes                          +---------+---------------+---------+-----------+----------+-------+ PTV      Full                                                 +---------+---------------+---------+-----------+----------+-------+ PERO     Full                                                 +---------+---------------+---------+-----------+----------+-------+  Left Venous Findings: +---------+---------------+---------+-----------+----------+-------+          CompressibilityPhasicitySpontaneityPropertiesSummary +---------+---------------+---------+-----------+----------+-------+ CFV      Full           Yes      Yes                          +---------+---------------+---------+-----------+----------+-------+ SFJ      Full                                                 +---------+---------------+---------+-----------+----------+-------+ FV Prox  Full                                                 +---------+---------------+---------+-----------+----------+-------+ FV Mid   Full                                                 +---------+---------------+---------+-----------+----------+-------+ FV DistalFull                                                  +---------+---------------+---------+-----------+----------+-------+  PFV      Full                                                 +---------+---------------+---------+-----------+----------+-------+ POP      Full           Yes      Yes                          +---------+---------------+---------+-----------+----------+-------+ PTV      Full                                                 +---------+---------------+---------+-----------+----------+-------+ PERO     Full                                                 +---------+---------------+---------+-----------+----------+-------+    Summary: Right: There is no evidence of deep vein thrombosis in Derrick lower extremity. No cystic structure found in Derrick popliteal fossa. Left: There is no evidence of deep vein thrombosis in Derrick lower extremity. No cystic structure found in Derrick popliteal fossa.  *See table(s) above for measurements and observations. Electronically signed by Monica Martinez MD on 12/12/2018 at 2:42:53 PM.    Final     Assessment and Plan:   Irregular heart rhythm -Cardiology was called to evaluate atrial fibrillation.  On my evaluation of Derrick EKG which had an automated reading of atrial fibrillation, I see distinct P waves.  Possible wandering pacemaker.  It appears irregular due to PACs.  This is consistent with prior EKGs on this [name]. -Derrick [name] had complaints of fast heartbeat this morning shortly after taking his morning prednisone.  He may have had fast heartbeat in part due to Derrick steroid and in part due to significant leg pain after his surgery which has been uncontrolled as he was not taking any pain medications.  Chest pain -Derrick [name] is a difficult historian.  His son said that this morning he put his hand on his left chest and complained of fast heartbeat and chest pain. This was in Derrick setting of Derrick [name] also complaining of severe left leg pain after recent back surgery.  Derrick [name]'s  chest pain resolved in Derrick ED when given IV pain medication and has not reoccurred since.  -BNP was normal and troponin negative.  -I do not feel that his pain was cardiac in nature.  -Continue to trend troponins and if remain negative can be discharged from a cardiac standpoint.   CAD -History of cath in 2013 with nonobstructive disease.  Last stress test was low risk in 2017. -[name] on Plavix and aspirin.  Not on a statin  Hypertension -Home medications include amlodipine 5 mg -[name] has had recurrent hypotensive episodes in Derrick past and so he has been allowed blood pressures in Derrick 150s to avoid syncope. -Blood pressure is fluctuating from 132/79-172/137 per documentation. -BP may improve with pain management.   CKD stage III -s/p right nephrectomy for renal cell carcinoma. -Serum creatinine 1.16, normal with mildly reduced GFR of 58.  Hyperlipidemia -Lipids have been fairly well controlled.  LDL in 09/2017 was 91.  Prior LDL was 64 in 2013.  [name] is only on omega fatty acids.  Unclear why Derrick [name] is not on a statin.  This may possibly be due to memory issues.  PAD -[name] has history of a failed limb salvage intervention and required right foot amputation.   For questions or updates, please contact [facility] Please consult [email or web address] for contact info under    Signed, Daune Perch, NP  12/12/2018 4:00 PM   History and all data above reviewed.  [name] examined.  I agree with Derrick findings as above.  Derrick [name] cannot provide any history around Derrick events this morning..  It seems that he has been having trouble with his short term memory since his back surgery.  He apparently had chest pain this morning and his wife noted his heart "thumping" in his chest.  However, he does not recall this and cannot describe this.  His son thinks this is all related to Derrick fact that he has had no pain meds for his back since surgery.  His EKG computer reading suggested atrial  fib but this was in correct.  He has MAT.  He apparently has had no recent cardiac complaints.  He was getting around prior to having back and leg pain.  He denies palpitations, presyncope or syncope, mid peaking systolic murmur at Derrick apex and radiating out Derrick outflow tract.   Derrick [name] exam reveals COR:RRR with ectopy  ,  Lungs: Clear  ,  Abd: Positive bowel sounds, no rebound no guarding, Ext No edema  .  All available labs, radiology testing, previous records reviewed. Agree with documented assessment and plan. Chest pain:  Difficult to assess but no objective evidence of ischemia.  OK to continue to rule out and if negative enzymes then no further work up in hospital.   Ectopy:  No evidence of atrial fib.  Can have an out [name] event monitor arranged at discharge.  Murmur:  I cancelled Derrick in [name] echo.  He has had some AS but not currently Derrick clinical problem.  Probably moderate.  Can have follow up clinically as an out [name].   Jeneen Rinks Freeman Borba  4:16 PM  12/12/2018

## 2018-12-12 NOTE — Progress Notes (Signed)
Primary nurse went in to check on patient. Patient was very disorient to place, time, and situation. He was combative, fighting, and kicking. Patient attempted to slide off of the bed and was caught by staff to prevent fall. Patient sustained skin tear to the back of right hand which he would not allow staff to dress as he was still fighting. Patient had already removed his iv and has now pulled the telemetry off. Attending notified and new order given. Family to be notified.

## 2018-12-12 NOTE — ED Notes (Signed)
Patient transported to Ultrasound 

## 2018-12-12 NOTE — ED Notes (Signed)
Patient transported to X-ray 

## 2018-12-12 NOTE — Progress Notes (Signed)
Family left.  Pt pulled out IV and pulled off telemetry leads.  Decided not to replace IV.  Will round on pt every 15 minutes.  Bed alarm is on.

## 2018-12-13 DIAGNOSIS — R0789 Other chest pain: Secondary | ICD-10-CM | POA: Diagnosis not present

## 2018-12-13 LAB — TROPONIN I: TROPONIN I: 0.03 ng/mL — AB (ref <0.03)

## 2018-12-13 IMAGING — DX DG FOOT COMPLETE 3+V*R*
3 series · 3 of 3 positions shown · non-contrast
Comparison: Plain films of the right foot 09/11/2017.

CLINICAL DATA: Status post transmetatarsal amputation today.

EXAM:
RIGHT FOOT COMPLETE - 3+ VIEW

[foot ap]
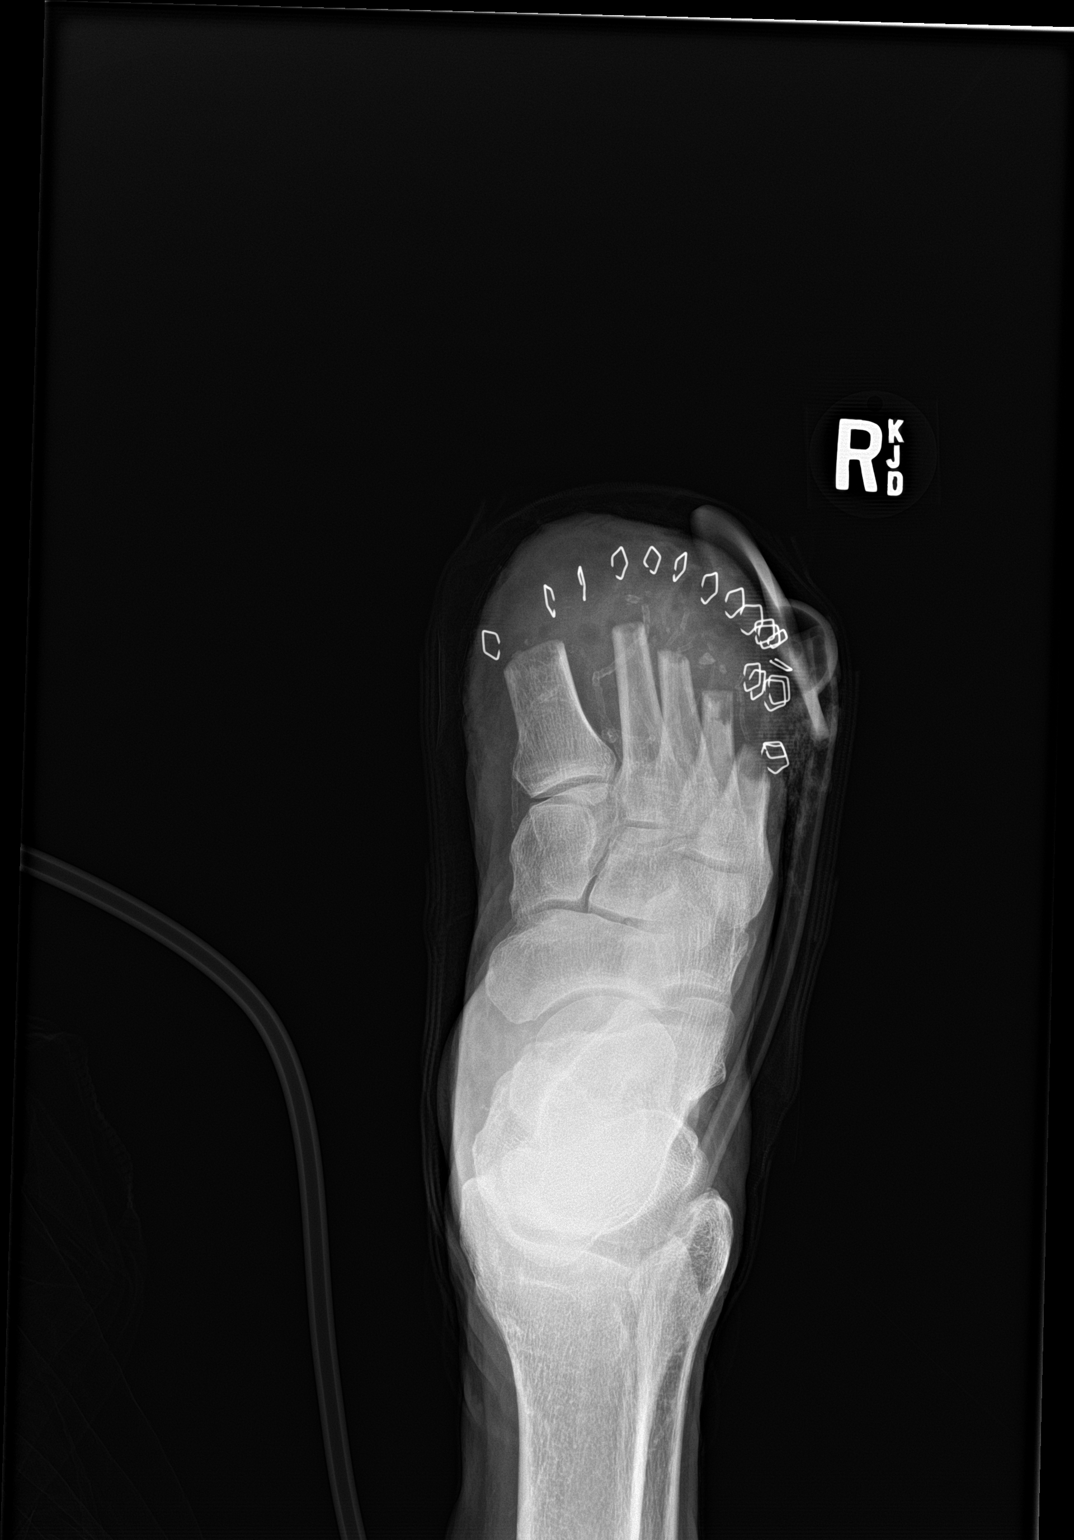

[foot obl]
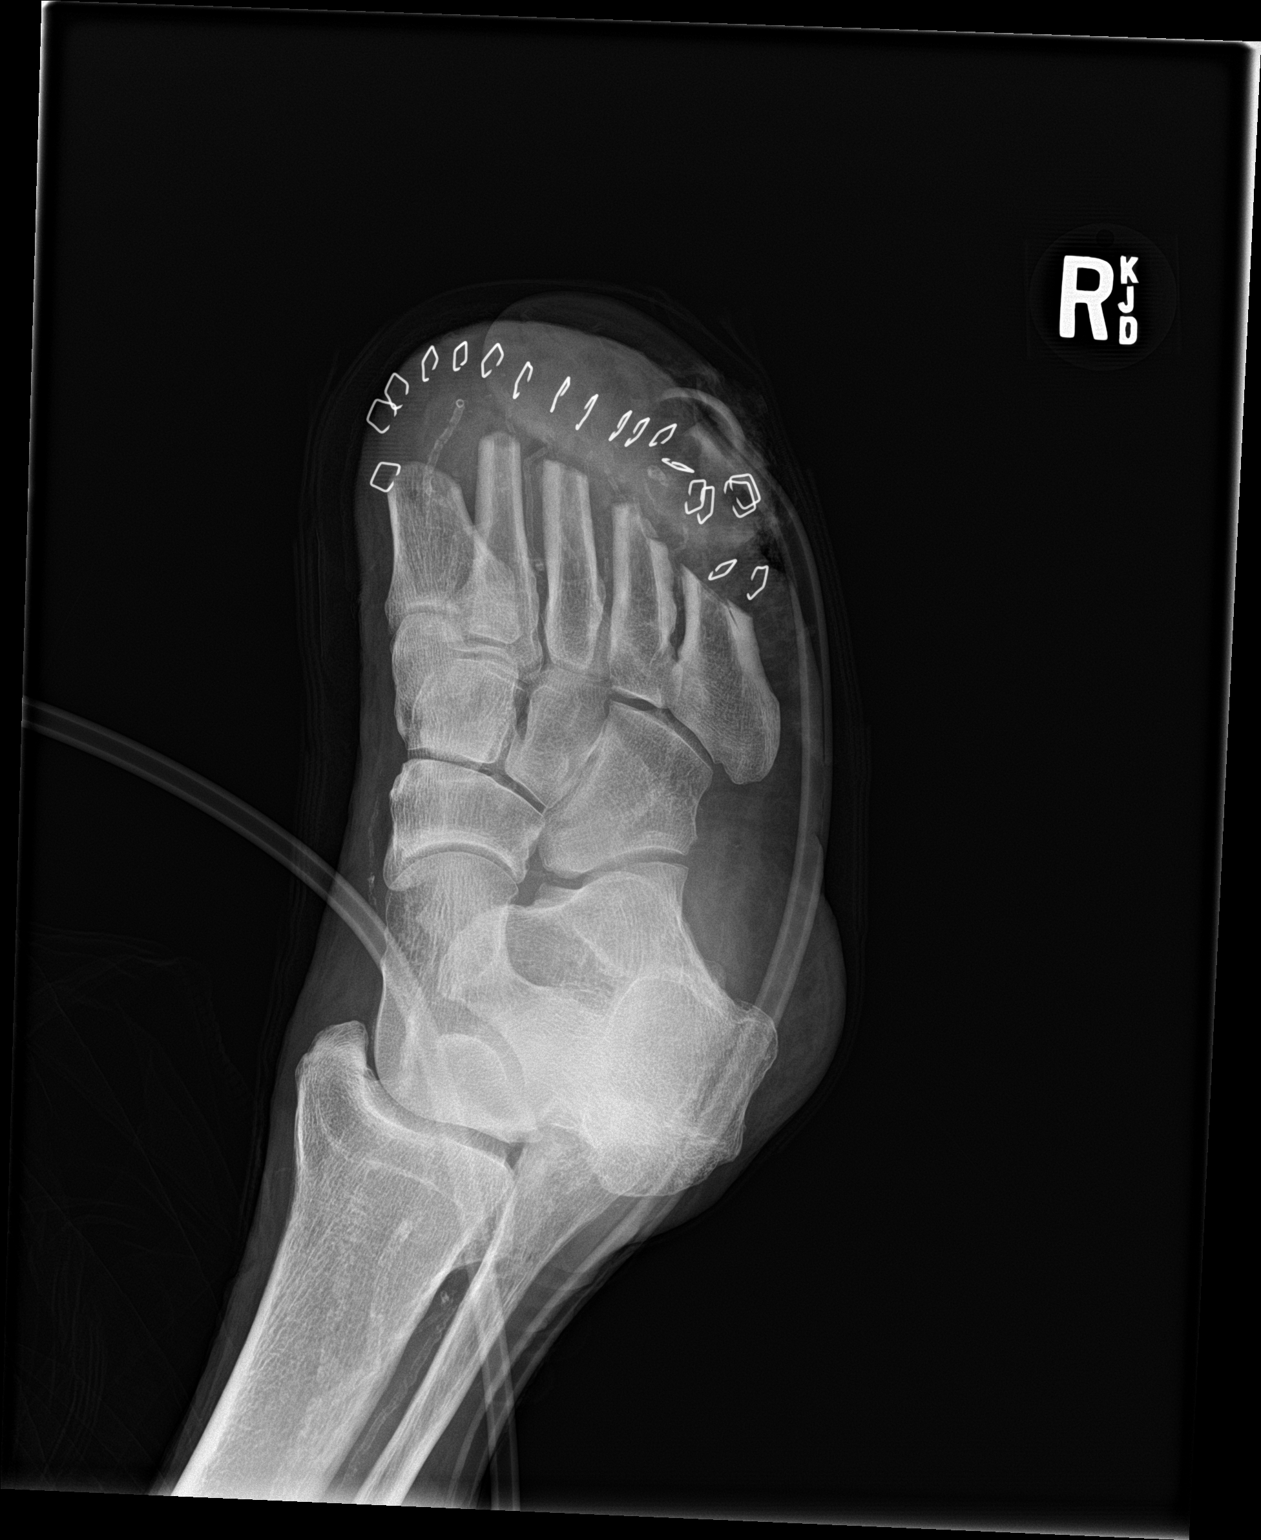

[foot lat]
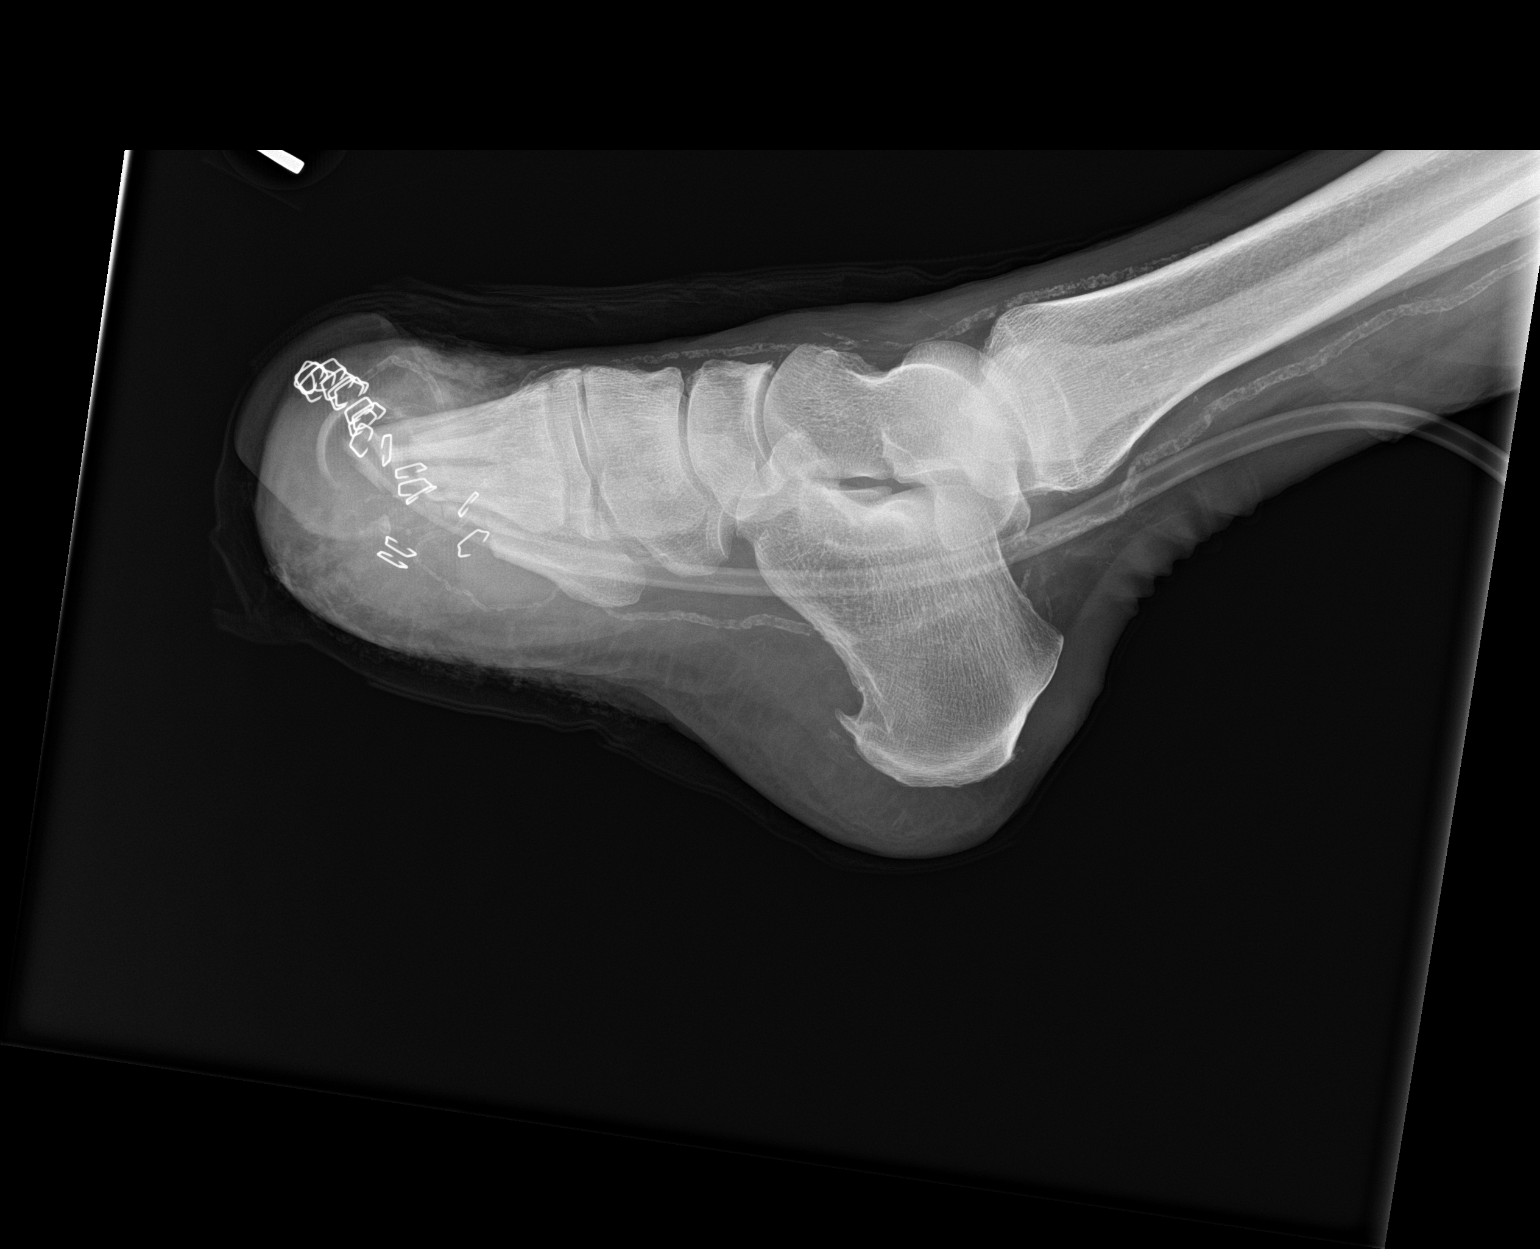

[3 of 3 positions shown; findings below may reference images not displayed]

FINDINGS: Previously seen and transmetatarsal amputation has been extended. No
acute abnormality is identified. Surgical staples are in place.
Surgical drain is noted.
IMPRESSION: Extension of prior transmetatarsal amputation.  No acute abnormality

## 2018-12-13 MED ORDER — LORAZEPAM 2 MG/ML IJ SOLN
1.0000 mg | Freq: Once | INTRAMUSCULAR | Status: AC
  Administered 2018-12-13: 1 mg via INTRAMUSCULAR
  Filled 2018-12-13: qty 1

## 2018-12-13 MED ORDER — HYDROCODONE-ACETAMINOPHEN 5-325 MG PO TABS: 1.0000 | ORAL_TABLET | Freq: Four times a day (QID) | ORAL | 0 refills | Status: DC | PRN

## 2018-12-13 MED ORDER — GABAPENTIN 100 MG PO CAPS: 100.0000 mg | ORAL_CAPSULE | Freq: Every day | ORAL | Status: DC

## 2018-12-13 MED ORDER — METOPROLOL TARTRATE 25 MG PO TABS
25.0000 mg | ORAL_TABLET | Freq: Once | ORAL | Status: AC
  Administered 2018-12-13: 25 mg via ORAL
  Filled 2018-12-13: qty 1

## 2018-12-13 NOTE — Progress Notes (Signed)
Dr.Bodenheimer returned call spoke to charge nurse and relayed to charge nurse "we will keep an eye on patient's heart rate for now".

## 2018-12-13 NOTE — Progress Notes (Addendum)
Educated pt wife at bedside on plan of care and how to contact RN  Pt wife verbalizes understanding  Pt resting comfortably in bed at this time  Bed alarm set, floor matts in place, bed in lowest position  Will continue to monitor

## 2018-12-13 NOTE — Discharge Summary (Signed)
Physician Discharge Summary  Derrick [name] OVZ:[id number] DOB: 05/24/1932 DOA: 12/12/2018  PCP: [name], MD  Admit date: 12/12/2018 Discharge date: 12/13/2018  Admitted From: home Discharge disposition: home   Recommendations for Outpatient Follow-Up:   1. 24 hour care by family 2. Bowel regimen 3. Consider outpatient palliative care referral 4. Event monitor per cardiology   Discharge Diagnosis:   Principal Problem:   Chest pain Active Problems:   Hypertension   High cholesterol   GERD (gastroesophageal reflux disease)   CKD (chronic kidney disease), stage III (HCC)   CAD (coronary artery disease)   PAD (peripheral artery disease) ([place] [place])    Discharge Condition: Improved.  Diet recommendation: Low sodium, heart healthy  Wound care: None.  Code status: Full.   History of Present Illness:   Derrick [name] is a 85 y.o. male with medical history significant of coronary artery disease, rheumatoid arthritis, BPH, chronic kidney disease stage 3, hypertension, right foot gangrene status post amputation, recent lumbar spine surgery for radiculopathy presenting from home with palpitation and chest discomfort since today morning.  Patient underwent spinal surgery 2 weeks ago, he was still having left knee pain and back pain issues.  He saw orthopedics few days ago and was prescribed prednisone and pain medications.  Today morning, he woke up with chest discomfort, left precordial, described as quivering, it was very bothersome, transient and improved when EMS was called.  No recurrence of palpitations or chest pain since then.  This was associated with feeling jittery, nervous and anxious.   Hospital Course by Problem:   MAT NOT a  Fib -cardiology consult appreciated -outpatient event monitor  Chest pain -slight elevation of troponin but patient was under stress so suspect some demand -no further episodes today  Dementia with delirium -patient will do much better at  home in a familiar environment-- discussed with wife in depth and she is in agreement    Medical Consultants:    cards  Discharge Exam:   Vitals:   12/13/18 0226 12/13/18 0828  BP: (!) 147/109 118/71  Pulse: (!) 140 97  Resp:    Temp:    SpO2:     Vitals:   12/12/18 1442 12/12/18 1808 12/13/18 0226 12/13/18 0828  BP:  (!) 140/95 (!) 147/109 118/71  Pulse: (!) 53 (!) 43 (!) 140 97  Resp: 16 17    Temp: (!) 97.4 F (36.3 C) 97.9 F (36.6 C)    TempSrc: Oral Oral    SpO2: 98% 97%    Weight: 73.2 kg     Height: 5\' 10"  (1.778 m)       General exam: Appears calm and comfortable..    The results of significant diagnostics from this hospitalization (including imaging, microbiology, ancillary and laboratory) are listed below for reference.     Procedures and Diagnostic Studies:   Dg Chest 2 View  Result Date: 12/12/2018 CLINICAL DATA:  A fib  Chest  Pain   Chronic  Knee pain  No  injury EXAM: CHEST - 2 VIEW COMPARISON:  09/19/2017 FINDINGS: Stable elevation of the LEFT hemidiaphragm. Tortuous aorta. Heart size is normal. Lungs are free of focal consolidations and pleural effusions. No pulmonary edema. There is midthoracic spondylosis. IMPRESSION: No evidence for acute cardiopulmonary abnormality. Electronically Signed   By: Nolon Nations M.D.   On: 12/12/2018 09:34   Dg Knee Complete 4 Views Left  Result Date: 12/12/2018 CLINICAL DATA:  A fib  Chest  Pain  Chronic  Knee pain  No  injury EXAM: LEFT KNEE - COMPLETE 4+ VIEW COMPARISON:  10/05/2018 FINDINGS: There are mild degenerative changes in the MEDIAL of the LATERAL compartments. Chondrocalcinosis is present. There is significant atherosclerotic calcification of femoral artery and its branches. No acute fracture. No joint effusion. IMPRESSION: 1. Degenerative changes. 2. No evidence for acute abnormality. Electronically Signed   By: Nolon Nations M.D.   On: 12/12/2018 09:35   Vas [place] Lower Extremity Venous (dvt) (only [facility])  Result Date: 12/12/2018  Lower Venous Study Indications: Edema.  Performing Technologist: Oliver Hum RVT  Examination Guidelines: A complete evaluation includes B-mode imaging, spectral Doppler, color Doppler, and power Doppler as needed of all accessible portions of each vessel. Bilateral testing is considered an integral part of a complete examination. Limited examinations for reoccurring indications may be performed as noted.  Right Venous Findings: +---------+---------------+---------+-----------+----------+-------+          CompressibilityPhasicitySpontaneityPropertiesSummary +---------+---------------+---------+-----------+----------+-------+ CFV      Full           Yes      Yes                          +---------+---------------+---------+-----------+----------+-------+ SFJ      Full                                                 +---------+---------------+---------+-----------+----------+-------+ FV Prox  Full                                                 +---------+---------------+---------+-----------+----------+-------+ FV Mid   Full                                                 +---------+---------------+---------+-----------+----------+-------+ FV DistalFull                                                 +---------+---------------+---------+-----------+----------+-------+ PFV      Full                                                 +---------+---------------+---------+-----------+----------+-------+ POP      Full           Yes      Yes                          +---------+---------------+---------+-----------+----------+-------+ PTV      Full                                                 +---------+---------------+---------+-----------+----------+-------+ PERO     Full                                                 +---------+---------------+---------+-----------+----------+-------+  Left Venous Findings:  +---------+---------------+---------+-----------+----------+-------+          CompressibilityPhasicitySpontaneityPropertiesSummary +---------+---------------+---------+-----------+----------+-------+ CFV      Full           Yes      Yes                          +---------+---------------+---------+-----------+----------+-------+ SFJ      Full                                                 +---------+---------------+---------+-----------+----------+-------+ FV Prox  Full                                                 +---------+---------------+---------+-----------+----------+-------+ FV Mid   Full                                                 +---------+---------------+---------+-----------+----------+-------+ FV DistalFull                                                 +---------+---------------+---------+-----------+----------+-------+ PFV      Full                                                 +---------+---------------+---------+-----------+----------+-------+ POP      Full           Yes      Yes                          +---------+---------------+---------+-----------+----------+-------+ PTV      Full                                                 +---------+---------------+---------+-----------+----------+-------+ PERO     Full                                                 +---------+---------------+---------+-----------+----------+-------+    Summary: Right: There is no evidence of deep vein thrombosis in the lower extremity. No cystic structure found in the popliteal fossa. Left: There is no evidence of deep vein thrombosis in the lower extremity. No cystic structure found in the popliteal fossa.  *See table(s) above for measurements and observations. Electronically signed by Monica Martinez MD on 12/12/2018 at 2:42:53 PM.    Final      Labs:   Basic Metabolic Panel: Recent Labs  Lab 12/12/18 0843  NA 133*  K 3.6  CL  105  CO2 17*  GLUCOSE 118*  BUN 10  CREATININE 1.16  CALCIUM 9.8   GFR Estimated Creatinine Clearance: 48.9 mL/min (by C-G formula based on SCr of 1.16 mg/dL). Liver Function Tests: No results for input(s): AST, ALT, ALKPHOS, BILITOT, PROT, ALBUMIN in the last 168 hours. No results for input(s): LIPASE, AMYLASE in the last 168 hours. No results for input(s): AMMONIA in the last 168 hours. Coagulation profile No results for input(s): INR, PROTIME in the last 168 hours.  CBC: Recent Labs  Lab 12/12/18 0843  WBC 10.3  HGB 13.8  HCT 41.2  MCV 86.4  PLT 341   Cardiac Enzymes: Recent Labs  Lab 12/12/18 0843 12/12/18 1715 12/12/18 1856 12/13/18 0215  TROPONINI <0.03 <0.03 <0.03 0.03*   BNP: Invalid input(s): POCBNP CBG: No results for input(s): GLUCAP in the last 168 hours. D-Dimer No results for input(s): DDIMER in the last 72 hours. Hgb A1c No results for input(s): HGBA1C in the last 72 hours. Lipid Profile No results for input(s): CHOL, HDL, LDLCALC, TRIG, CHOLHDL, LDLDIRECT in the last 72 hours. Thyroid function studies No results for input(s): TSH, T4TOTAL, T3FREE, THYROIDAB in the last 72 hours.  Invalid input(s): FREET3 Anemia work up No results for input(s): VITAMINB12, FOLATE, FERRITIN, TIBC, IRON, RETICCTPCT in the last 72 hours. Microbiology No results found for this or any previous visit (from the past 240 hour(s)).   Discharge Instructions:   Discharge Instructions    Diet - low sodium heart healthy   Complete by:  As directed    Discharge instructions   Complete by:  As directed    Bowel regimen to avoid fecal impaction/constipation-- PRN colase or senna (miralax may not work well if he is not hydrated)   Increase activity slowly   Complete by:  As directed      Allergies as of 12/13/2018   No Known Allergies     Medication List    TAKE these medications   acetaminophen 500 MG tablet Commonly known as:  TYLENOL Take 1,000 mg by mouth  every 6 (six) hours as needed for moderate pain or headache.   amLODipine 5 MG tablet Commonly known as:  NORVASC Take 1 tablet (5 mg total) by mouth daily. SCHEDULE OV FOR FURTHER REFILLS.   clopidogrel 75 MG tablet Commonly known as:  PLAVIX Take 75 mg by mouth daily.   docusate sodium 100 MG capsule Commonly known as:  COLACE Take 100 mg by mouth daily.   finasteride 5 MG tablet Commonly known as:  PROSCAR Take 5 mg by mouth every evening.   gabapentin 100 MG capsule Commonly known as:  NEURONTIN Take 1 capsule (100 mg total) by mouth at bedtime.   HYDROcodone-acetaminophen 5-325 MG tablet Commonly known as:  NORCO/VICODIN Take 1 tablet by mouth every 6 (six) hours as needed for moderate pain ((score 4 to 6)).   hydroxychloroquine 200 MG tablet Commonly known as:  PLAQUENIL Take 200 mg by mouth daily.   methocarbamol 500 MG tablet Commonly known as:  ROBAXIN Take 1 tablet (500 mg total) by mouth every 6 (six) hours as needed for muscle spasms.   multivitamin with minerals Tabs tablet Take 1 tablet by mouth every evening.   Omega-3-6-9 Caps Take 2 capsules by mouth daily.   pantoprazole 40 MG tablet Commonly known as:  PROTONIX Take 40 mg by mouth every evening.   predniSONE 5 MG tablet Commonly known as:  DELTASONE Take 5 mg by mouth daily with breakfast.   sertraline 25 MG tablet Commonly known as:  ZOLOFT Take 25 mg by mouth daily.   traZODone 50 MG tablet Commonly known as:  DESYREL Take 50-100 mg by mouth at bedtime as needed for sleep.      Follow-up Information    [name], MD Follow up in 1 week(s).   Specialty:  Internal Medicine Contact information: [address] [place] [address] [phone]        Sanda Klein, MD Follow up.   Specialty:  Cardiology Why:  outpatient event monitor Contact information: [address] [place] [address] [phone]            Time coordinating  discharge: 25 min  Signed:  [facility] 12/13/2018, 1:43 PM

## 2018-12-13 NOTE — Progress Notes (Signed)
Pt IV removed, catheter intact and telemetry removed. Pt has all belongings. Discharge education provided at bedside with pt wife [name]. Pt wife stated they have walker and cane at home to assist with ambulation. Educated pt wife on how to make follow up appointment, pt wife verbalizes understanding. Awaiting pt son for clothing and transport

## 2018-12-13 NOTE — Progress Notes (Signed)
Dr.Bodenheimer paged in regards to patient's heart rate dropping low

## 2018-12-13 NOTE — Progress Notes (Signed)
Pt discharged via wheelchair with RN and family

## 2018-12-13 NOTE — Progress Notes (Signed)
Dr.Bodenheimer paged regarding patient's elevated heart rate and patient being anxious. Orders received lopressor 25mg  by mouth and ativan 1mg  intravenous

## 2018-12-18 DIAGNOSIS — R413 Other amnesia: Secondary | ICD-10-CM | POA: Diagnosis not present

## 2018-12-18 DIAGNOSIS — M7989 Other specified soft tissue disorders: Secondary | ICD-10-CM | POA: Diagnosis not present

## 2018-12-18 DIAGNOSIS — M25562 Pain in left knee: Secondary | ICD-10-CM | POA: Diagnosis not present

## 2018-12-23 ENCOUNTER — Other Ambulatory Visit: Payer: Self-pay | Admitting: Cardiovascular Disease

## 2018-12-30 DIAGNOSIS — M25562 Pain in left knee: Secondary | ICD-10-CM | POA: Diagnosis not present

## 2019-01-06 DIAGNOSIS — Z0000 Encounter for general adult medical examination without abnormal findings: Secondary | ICD-10-CM | POA: Diagnosis not present

## 2019-01-06 DIAGNOSIS — I10 Essential (primary) hypertension: Secondary | ICD-10-CM | POA: Diagnosis not present

## 2019-01-13 DIAGNOSIS — Z0000 Encounter for general adult medical examination without abnormal findings: Secondary | ICD-10-CM | POA: Diagnosis not present

## 2019-01-13 DIAGNOSIS — R748 Abnormal levels of other serum enzymes: Secondary | ICD-10-CM | POA: Diagnosis not present

## 2019-01-13 DIAGNOSIS — I2510 Atherosclerotic heart disease of native coronary artery without angina pectoris: Secondary | ICD-10-CM | POA: Diagnosis not present

## 2019-01-13 DIAGNOSIS — D649 Anemia, unspecified: Secondary | ICD-10-CM | POA: Diagnosis not present

## 2019-01-13 DIAGNOSIS — R2689 Other abnormalities of gait and mobility: Secondary | ICD-10-CM | POA: Diagnosis not present

## 2019-01-13 DIAGNOSIS — K219 Gastro-esophageal reflux disease without esophagitis: Secondary | ICD-10-CM | POA: Diagnosis not present

## 2019-01-13 DIAGNOSIS — I10 Essential (primary) hypertension: Secondary | ICD-10-CM | POA: Diagnosis not present

## 2019-01-14 DIAGNOSIS — Z905 Acquired absence of kidney: Secondary | ICD-10-CM | POA: Diagnosis not present

## 2019-01-14 DIAGNOSIS — N189 Chronic kidney disease, unspecified: Secondary | ICD-10-CM | POA: Diagnosis not present

## 2019-01-14 DIAGNOSIS — R2689 Other abnormalities of gait and mobility: Secondary | ICD-10-CM | POA: Diagnosis not present

## 2019-01-14 DIAGNOSIS — R413 Other amnesia: Secondary | ICD-10-CM | POA: Diagnosis not present

## 2019-01-14 DIAGNOSIS — Z7902 Long term (current) use of antithrombotics/antiplatelets: Secondary | ICD-10-CM | POA: Diagnosis not present

## 2019-01-14 DIAGNOSIS — M1990 Unspecified osteoarthritis, unspecified site: Secondary | ICD-10-CM | POA: Diagnosis not present

## 2019-01-14 DIAGNOSIS — K219 Gastro-esophageal reflux disease without esophagitis: Secondary | ICD-10-CM | POA: Diagnosis not present

## 2019-01-14 DIAGNOSIS — R296 Repeated falls: Secondary | ICD-10-CM | POA: Diagnosis not present

## 2019-01-14 DIAGNOSIS — I129 Hypertensive chronic kidney disease with stage 1 through stage 4 chronic kidney disease, or unspecified chronic kidney disease: Secondary | ICD-10-CM | POA: Diagnosis not present

## 2019-01-14 DIAGNOSIS — Z6823 Body mass index (BMI) 23.0-23.9, adult: Secondary | ICD-10-CM | POA: Diagnosis not present

## 2019-01-14 DIAGNOSIS — M6281 Muscle weakness (generalized): Secondary | ICD-10-CM | POA: Diagnosis not present

## 2019-01-14 DIAGNOSIS — Z7952 Long term (current) use of systemic steroids: Secondary | ICD-10-CM | POA: Diagnosis not present

## 2019-01-14 DIAGNOSIS — R748 Abnormal levels of other serum enzymes: Secondary | ICD-10-CM | POA: Diagnosis not present

## 2019-01-14 DIAGNOSIS — Z8673 Personal history of transient ischemic attack (TIA), and cerebral infarction without residual deficits: Secondary | ICD-10-CM | POA: Diagnosis not present

## 2019-01-14 DIAGNOSIS — M059 Rheumatoid arthritis with rheumatoid factor, unspecified: Secondary | ICD-10-CM | POA: Diagnosis not present

## 2019-01-14 DIAGNOSIS — I2510 Atherosclerotic heart disease of native coronary artery without angina pectoris: Secondary | ICD-10-CM | POA: Diagnosis not present

## 2019-01-14 DIAGNOSIS — D631 Anemia in chronic kidney disease: Secondary | ICD-10-CM | POA: Diagnosis not present

## 2019-01-14 DIAGNOSIS — Z85528 Personal history of other malignant neoplasm of kidney: Secondary | ICD-10-CM | POA: Diagnosis not present

## 2019-01-14 DIAGNOSIS — E7800 Pure hypercholesterolemia, unspecified: Secondary | ICD-10-CM | POA: Diagnosis not present

## 2019-01-14 DIAGNOSIS — Z9181 History of falling: Secondary | ICD-10-CM | POA: Diagnosis not present

## 2019-01-14 DIAGNOSIS — R7303 Prediabetes: Secondary | ICD-10-CM | POA: Diagnosis not present

## 2019-01-21 DIAGNOSIS — R413 Other amnesia: Secondary | ICD-10-CM | POA: Diagnosis not present

## 2019-01-21 DIAGNOSIS — N189 Chronic kidney disease, unspecified: Secondary | ICD-10-CM | POA: Diagnosis not present

## 2019-01-21 DIAGNOSIS — Z8673 Personal history of transient ischemic attack (TIA), and cerebral infarction without residual deficits: Secondary | ICD-10-CM | POA: Diagnosis not present

## 2019-01-21 DIAGNOSIS — D631 Anemia in chronic kidney disease: Secondary | ICD-10-CM | POA: Diagnosis not present

## 2019-01-21 DIAGNOSIS — I2510 Atherosclerotic heart disease of native coronary artery without angina pectoris: Secondary | ICD-10-CM | POA: Diagnosis not present

## 2019-01-21 DIAGNOSIS — Z85528 Personal history of other malignant neoplasm of kidney: Secondary | ICD-10-CM | POA: Diagnosis not present

## 2019-01-21 DIAGNOSIS — Z905 Acquired absence of kidney: Secondary | ICD-10-CM | POA: Diagnosis not present

## 2019-01-21 DIAGNOSIS — Z7952 Long term (current) use of systemic steroids: Secondary | ICD-10-CM | POA: Diagnosis not present

## 2019-01-21 DIAGNOSIS — M059 Rheumatoid arthritis with rheumatoid factor, unspecified: Secondary | ICD-10-CM | POA: Diagnosis not present

## 2019-01-21 DIAGNOSIS — M6281 Muscle weakness (generalized): Secondary | ICD-10-CM | POA: Diagnosis not present

## 2019-01-21 DIAGNOSIS — Z9181 History of falling: Secondary | ICD-10-CM | POA: Diagnosis not present

## 2019-01-21 DIAGNOSIS — K219 Gastro-esophageal reflux disease without esophagitis: Secondary | ICD-10-CM | POA: Diagnosis not present

## 2019-01-21 DIAGNOSIS — R2689 Other abnormalities of gait and mobility: Secondary | ICD-10-CM | POA: Diagnosis not present

## 2019-01-21 DIAGNOSIS — E7800 Pure hypercholesterolemia, unspecified: Secondary | ICD-10-CM | POA: Diagnosis not present

## 2019-01-21 DIAGNOSIS — Z7902 Long term (current) use of antithrombotics/antiplatelets: Secondary | ICD-10-CM | POA: Diagnosis not present

## 2019-01-21 DIAGNOSIS — R296 Repeated falls: Secondary | ICD-10-CM | POA: Diagnosis not present

## 2019-01-21 DIAGNOSIS — R7303 Prediabetes: Secondary | ICD-10-CM | POA: Diagnosis not present

## 2019-01-21 DIAGNOSIS — I129 Hypertensive chronic kidney disease with stage 1 through stage 4 chronic kidney disease, or unspecified chronic kidney disease: Secondary | ICD-10-CM | POA: Diagnosis not present

## 2019-01-21 DIAGNOSIS — Z6823 Body mass index (BMI) 23.0-23.9, adult: Secondary | ICD-10-CM | POA: Diagnosis not present

## 2019-02-04 DIAGNOSIS — Z8679 Personal history of other diseases of the circulatory system: Secondary | ICD-10-CM | POA: Diagnosis not present

## 2019-02-04 DIAGNOSIS — R0789 Other chest pain: Secondary | ICD-10-CM | POA: Diagnosis not present

## 2019-02-04 DIAGNOSIS — R748 Abnormal levels of other serum enzymes: Secondary | ICD-10-CM | POA: Diagnosis not present

## 2019-02-04 DIAGNOSIS — I2510 Atherosclerotic heart disease of native coronary artery without angina pectoris: Secondary | ICD-10-CM | POA: Diagnosis not present

## 2019-02-05 ENCOUNTER — Telehealth: Payer: Self-pay | Admitting: Cardiology

## 2019-02-05 NOTE — Telephone Encounter (Signed)
Mychart pending, no smartphone, pre reg complete 02/05/19 AF

## 2019-02-06 ENCOUNTER — Telehealth: Payer: Self-pay

## 2019-02-06 ENCOUNTER — Encounter: Payer: Self-pay | Admitting: Cardiology

## 2019-02-06 ENCOUNTER — Telehealth (INDEPENDENT_AMBULATORY_CARE_PROVIDER_SITE_OTHER): Payer: Medicare Other | Admitting: Cardiology

## 2019-02-06 VITALS — Ht 72.0 in | Wt 165.0 lb

## 2019-02-06 DIAGNOSIS — I2510 Atherosclerotic heart disease of native coronary artery without angina pectoris: Secondary | ICD-10-CM | POA: Diagnosis not present

## 2019-02-06 DIAGNOSIS — I4891 Unspecified atrial fibrillation: Secondary | ICD-10-CM | POA: Diagnosis not present

## 2019-02-06 DIAGNOSIS — I10 Essential (primary) hypertension: Secondary | ICD-10-CM

## 2019-02-06 NOTE — Telephone Encounter (Signed)
Virtual Visit Pre-Appointment Phone Call  "Derrick [name], I am calling you today to discuss your upcoming appointment. We are currently trying to limit exposure to the virus that causes COVID-19 by seeing patients at home rather than in the office."  1. "What is the BEST phone number to call the day of the visit?" - include this in appointment notes  2. "Do you have or have access to (through a family member/friend) a smartphone with video capability that we can use for your visit?" a. If yes - list this number in appt notes as "cell" (if different from BEST phone #) and list the appointment type as a VIDEO visit in appointment notes b. If no - list the appointment type as a PHONE visit in appointment notes  3. Confirm consent - "In the setting of the current Covid19 crisis, you are scheduled for a PHONE visit with your provider on 02/06/2019 at 2:30PM.  Just as we do with many in-office visits, in order for you to participate in this visit, we must obtain consent.  If you'd like, I can send this to your mychart (if signed up) or email for you to review.  Otherwise, I can obtain your verbal consent now.  All virtual visits are billed to your insurance company just like a normal visit would be.  By agreeing to a virtual visit, we'd like you to understand that the technology does not allow for your provider to perform an examination, and thus may limit your provider's ability to fully assess your condition. If your provider identifies any concerns that need to be evaluated in person, we will make arrangements to do so.  Finally, though the technology is pretty good, we cannot assure that it will always work on either your or our end, and in the setting of a video visit, we may have to convert it to a phone-only visit.  In either situation, we cannot ensure that we have a secure connection.  Are you willing to proceed?" STAFF: Did the patient verbally acknowledge consent to telehealth visit? Document YES/NO  here: YES  4. Advise patient to be prepared - "Two hours prior to your appointment, go ahead and check your blood pressure, pulse, oxygen saturation, and your weight (if you have the equipment to check those) and write them all down. When your visit starts, your provider will ask you for this information. If you have an Apple Watch or Kardia device, please plan to have heart rate information ready on the day of your appointment. Please have a pen and paper handy nearby the day of the visit as well."  5. Give patient instructions for MyChart download to smartphone OR [email or web address] as below if video visit (depending on what platform provider is using)  6. Inform patient they will receive a phone call 15 minutes prior to their appointment time (may be from unknown caller ID) so they should be prepared to answer    TELEPHONE CALL NOTE  Derrick [name] has been deemed a candidate for a follow-up tele-health visit to limit community exposure during the Covid-19 pandemic. I spoke with the patient via phone to ensure availability of phone/video source, confirm preferred email & phone number, and discuss instructions and expectations.  I reminded Derrick [name] to be prepared with any vital sign and/or heart rhythm information that could potentially be obtained via home monitoring, at the time of his visit. I reminded Derrick [name] to expect a phone call prior to his  visit.  [name], CMA 02/06/2019 2:16 PM   INSTRUCTIONS FOR DOWNLOADING THE MYCHART APP TO SMARTPHONE  - The patient must first make sure to have activated MyChart and know their login information - If Apple, go to [employer] and type in MyChart in the search bar and download the app. If Android, ask patient to go to [employer] and type in [facility] in the search bar and download the app. The app is free but as with any other app downloads, their phone may require them to verify saved payment information or Apple/Android password.  -  The patient will need to then log into the app with their MyChart username and password, and select [facility] as their healthcare provider to link the account. When it is time for your visit, go to the MyChart app, find appointments, and click Begin Video Visit. Be sure to Select Allow for your device to access the Microphone and Camera for your visit. You will then be connected, and your provider will be with you shortly.  **If they have any issues connecting, or need assistance please contact MyChart service desk [phone])**  **If using a computer, in order to ensure the best quality for their visit they will need to use either of the following Internet Browsers: [employer], or Google Chrome**  IF USING DOXIMITY or DOXY.ME - The patient will receive a link just prior to their visit by text.     FULL LENGTH CONSENT FOR TELE-HEALTH VISIT   I hereby voluntarily request, consent and authorize [facility] and its employed or contracted physicians, physician assistants, nurse practitioners or other licensed health care professionals (the Practitioner), to provide me with telemedicine health care services (the "Services") as deemed necessary by the treating Practitioner. I acknowledge and consent to receive the Services by the Practitioner via telemedicine. I understand that the telemedicine visit will involve communicating with the Practitioner through live audiovisual communication technology and the disclosure of certain medical information by electronic transmission. I acknowledge that I have been given the opportunity to request an in-person assessment or other available alternative prior to the telemedicine visit and am voluntarily participating in the telemedicine visit.  I understand that I have the right to withhold or withdraw my consent to the use of telemedicine in the course of my care at any time, without affecting my right to future care or treatment, and that the  Practitioner or I may terminate the telemedicine visit at any time. I understand that I have the right to inspect all information obtained and/or recorded in the course of the telemedicine visit and may receive copies of available information for a reasonable fee.  I understand that some of the potential risks of receiving the Services via telemedicine include:  Marland Kitchen Delay or interruption in medical evaluation due to technological equipment failure or disruption; . Information transmitted may not be sufficient (e.g. poor resolution of images) to allow for appropriate medical decision making by the Practitioner; and/or  . In rare instances, security protocols could fail, causing a breach of personal health information.  Furthermore, I acknowledge that it is my responsibility to provide information about my medical history, conditions and care that is complete and accurate to the best of my ability. I acknowledge that Practitioner's advice, recommendations, and/or decision may be based on factors not within their control, such as incomplete or inaccurate data provided by me or distortions of diagnostic images or specimens that may result from electronic transmissions. I  understand that the practice of medicine is not an exact science and that Practitioner makes no warranties or guarantees regarding treatment outcomes. I acknowledge that I will receive a copy of this consent concurrently upon execution via email to the email address I last provided but may also request a printed copy by calling the office of [facility].    I understand that my insurance will be billed for this visit.   I have read or had this consent read to me. . I understand the contents of this consent, which adequately explains the benefits and risks of the Services being provided via telemedicine.  . I have been provided ample opportunity to ask questions regarding this consent and the Services and have had my questions answered to my  satisfaction. . I give my informed consent for the services to be provided through the use of telemedicine in my medical care  By participating in this telemedicine visit I agree to the above.

## 2019-02-06 NOTE — Progress Notes (Signed)
Virtual Visit via Telephone Note   This visit type was conducted due to national recommendations for restrictions regarding the COVID-19 Pandemic (e.g. social distancing) in an effort to limit this patient's exposure and mitigate transmission in our community.  Due to his co-morbid illnesses, this patient is at least at moderate risk for complications without adequate follow up.  This format is felt to be most appropriate for this patient at this time.  The patient did not have access to video technology/had technical difficulties with video requiring transitioning to audio format only (telephone).  All issues noted in this document were discussed and addressed.  No physical exam could be performed with this format.  Please refer to the patient's chart for his  consent to telehealth for [facility].   Date:  02/06/2019   ID:  Derrick [name], DOB 1932/07/03, MRN [id number]  Patient Location: Home Provider Location: Home  PCP:  [name], MD  Cardiologist:  Sanda Klein, MD  Electrophysiologist:  None   Evaluation Performed:  Follow-Up Visit  Chief Complaint: Post hospital follow-up  History of Present Illness:    Derrick [name] is a 85 y.o. male with a history of dementia, moderate coronary disease at catheterization in 2013 with a 40 to 50% RCA and 40% LAD, a history of renal insufficiency status post nephrectomy for renal cell cancer, labile hypertension, and recent back surgery February 2020.  The patient was readmitted to the hospital December 12, 2018 with palpitations and not feeling well.  He had been placed on steroids for persistent pain postop.  In the emergency room we were consulted for atrial fibrillation but on review it apparently was sinus rhythm sinus tachycardia with PACs.  The patient was discharged and lives at home with his wife.  When I interviewed him today over the phone he has poor memory, he could not find his blood pressure readings or his blood pressure cuff, and he  had no memory of any specific instructions given to him by any of his physicians.  I spoke to Derrick [name], she related that the patient had had some chest pain and was seen at Dr. Pennie Banter office in the last day or 2.  It sounds like he was placed on a Nitro-Dur patch.  He does not complaint of chest pain to me, his main complaint is persistent leg weakness.  He is using a walker at home.  At some point a physical therapist was coming to the house but he apparently sent them away.  Derrick [name] tells me that Dr. Shelia Media is trying to arrange for home PT to come back.  The patient does not have symptoms concerning for COVID-19 infection (fever, chills, cough, or new shortness of breath).    Past Medical History:  Diagnosis Date  . Anemia   . Anxiety   . Arthritis, rheumatoid ([place])   . BPH (benign prostatic hyperplasia)   . CAD (coronary artery disease)   . CKD (chronic kidney disease)   . Depression   . Diverticulosis   . Elevated PSA   . Gangrene ([place])    of toe right foot  . GERD (gastroesophageal reflux disease)   . Herpes simplex labialis   . High cholesterol   . History of colonic polyps   . Hypertension   . Insomnia   . Lacunar infarction ([place])   . Memory loss   . Obesity   . Panic attacks   . Prostate cancer ([place]) YRS AGO  . Renal  cell cancer ([place])    s/p nephrectomy in 2000  . Seropositive rheumatoid arthritis ([place])   . Spermatocele   . Tinnitus   . Tremor of both hands    Past Surgical History:  Procedure Laterality Date  . AMPUTATION TOE Right 06/13/2017   Procedure: AMPUTATION TOE 1st and 2nd;  Surgeon: [name], DPM;  Location: [place];  Service: Podiatry;  Laterality: Right;  . AMPUTATION TOE     RIGHT FOOT ALL TOES GONE  . BACK SURGERY  YRS AGO   LOWER  . COLONOSCOPY    . GRAFT APPLICATION Right 06/06/9406   Procedure: EPIFIX GRAFT APPLICATION OF RIGHT FOOT;  Surgeon: [name], DPM;  Location: [place];  Service: Podiatry;  Laterality: Right;  . I&D  EXTREMITY Right 09/13/2017   Procedure: IRRIGATION AND DEBRIDEMENT AND BONE BIOPSY;  Surgeon: [name], DPM;  Location: [place];  Service: Podiatry;  Laterality: Right;  . INCISION AND DRAINAGE OF WOUND Right 12/18/2017   Procedure: IRRIGATION AND DEBRIDEMENT WOUND ON RIGHT FOOT;  Surgeon: [name], DPM;  Location: [place];  Service: Podiatry;  Laterality: Right;  . IR ANGIOGRAM EXTREMITY RIGHT  05/30/2017  . IR ANGIOGRAM EXTREMITY RIGHT  08/08/2017  . IR ANGIOGRAM SELECTIVE EACH ADDITIONAL VESSEL  08/08/2017  . IR ANGIOGRAM SELECTIVE EACH ADDITIONAL VESSEL  08/08/2017  . IR RADIOLOGIST EVAL & MGMT  05/16/2017  . IR TIB-PERO ART PTA MOD SED  05/30/2017  . IR TIB-PERO ART PTA MOD SED  08/08/2017  . IR TIB-PERO ART UNI PTA EA ADD VESSEL MOD SED  05/30/2017  . IR TIB-PERO ART UNI PTA EA ADD VESSEL MOD SED  08/08/2017  . IR US GUIDE VASC ACCESS RIGHT  05/30/2017  . IR US GUIDE VASC ACCESS RIGHT  08/08/2017  . LEFT HEART CATHETERIZATION WITH CORONARY ANGIOGRAM N/A 11/19/2011   Procedure: LEFT HEART CATHETERIZATION WITH CORONARY ANGIOGRAM;  Surgeon: [name], MD;  Location: [facility] CATH LAB;  Service: Cardiovascular;  Laterality: N/A;  . LUMBAR LAMINECTOMY/DECOMPRESSION MICRODISCECTOMY N/A 11/26/2018   Procedure: Lumbar central decompression L2-3, L3-4;  Surgeon: [name], MD;  Location: [facility];  Service: Orthopedics;  Laterality: N/A;  150min  . NEPHRECTOMY  10/1998  . TRANSMETATARSAL AMPUTATION Right 08/14/2017   Procedure: TRANSMETATARSAL AMPUTATION;  Surgeon: [name], DPM;  Location: [place];  Service: Podiatry;  Laterality: Right;     Current Meds  Medication Sig  . acetaminophen (TYLENOL) 500 MG tablet Take 1,000 mg by mouth every 6 (six) hours as needed for moderate pain or headache.  Marland Kitchen amLODipine (NORVASC) 5 MG tablet Take 1 tablet (5 mg total) by mouth daily.  . clopidogrel (PLAVIX) 75 MG tablet Take 75 mg by mouth daily.  Marland Kitchen docusate sodium (COLACE) 100 MG capsule Take 100  mg by mouth daily.   . finasteride (PROSCAR) 5 MG tablet Take 5 mg by mouth every evening.   . gabapentin (NEURONTIN) 100 MG capsule Take 1 capsule (100 mg total) by mouth at bedtime.  Marland Kitchen HYDROcodone-acetaminophen (NORCO/VICODIN) 5-325 MG tablet Take 1 tablet by mouth every 6 (six) hours as needed for moderate pain ((score 4 to 6)).  . hydroxychloroquine (PLAQUENIL) 200 MG tablet Take 200 mg by mouth daily.  . Multiple Vitamin (MULTIVITAMIN WITH MINERALS) TABS tablet Take 1 tablet by mouth every evening.   . predniSONE (DELTASONE) 5 MG tablet Take 5 mg by mouth daily with breakfast.  . sertraline (ZOLOFT) 25 MG tablet Take 25 mg by  mouth daily.     Allergies:   Patient has no known allergies.   Social History   Tobacco Use  . Smoking status: Former Smoker    Packs/day: 1.00    Years: 29.00    Pack years: 29.00    Types: Cigarettes    Last attempt to quit: 10/08/1978    Years since quitting: 40.3  . Smokeless tobacco: Never Used  . Tobacco comment: tried smokeless tobacco once but made him sick  Substance Use Topics  . Alcohol use: No  . Drug use: No     Family Hx: The patient's family history includes Breast cancer (age of onset: 87) in his daughter; COPD in his son; Cancer in his paternal aunt and sister; Heart attack (age of onset: 49) in his son; Heart attack (age of onset: 72) in his father; Kidney disease in his sister; Kidney failure in his sister; Leukemia (age of onset: 68) in his son; Stroke in his father.  ROS:   Please see the history of present illness.    All other systems reviewed and are negative.   Prior CV studies:   The following studies were reviewed today: Echo 12/12/2018-  1. The left ventricle was not well visualized. Left ventricular diastology could not be evaluated.  2. Left atrial size was not well visualized.  3. Eccentric MR no fully evaluated.  4. The tricuspid valve is normal in structure.  5. The aortic valve was not well visualized.  6. The  pulmonic valve was normal in structure.  Labs/Other Tests and Data Reviewed:    EKG:  An ECG dated 02/04/2019 was personally reviewed today and demonstrated:  NSR with PACs  Recent Labs: 11/26/2018: Magnesium 2.0 11/29/2018: TSH 0.566 12/03/2018: ALT 31 12/12/2018: B Natriuretic Peptide 69.1; BUN 10; Creatinine, Ser 1.16; Hemoglobin 13.8; Platelets 341; Potassium 3.6; Sodium 133   Recent Lipid Panel Lab Results  Component Value Date/Time   CHOL 171 10/04/2017 12:01 PM   TRIG 95 10/04/2017 12:01 PM   HDL 61 10/04/2017 12:01 PM   CHOLHDL 2.8 10/04/2017 12:01 PM   CHOLHDL 2.3 09/15/2012 04:06 AM   LDLCALC 91 10/04/2017 12:01 PM    Wt Readings from Last 3 Encounters:  02/06/19 165 lb (74.8 kg)  12/12/18 161 lb 6 oz (73.2 kg)  11/26/18 172 lb (78 kg)     Objective:    Vital Signs:  Ht 6' (1.829 m)   Wt 165 lb (74.8 kg)   BMI 22.38 kg/m    VITAL SIGNS:  reviewed  ASSESSMENT & PLAN:    PAF- Not convinced of this- will ask Dr [name] to review his EKGs.  If he is felt ot have AF he does not appear to be a candidate for anticoagulation, he tells me he has been falling secondary to post op leg weakness.   Recent chest pain- Possibly angina- pt is a poor historian. Topical NTG added by PCP  CAD- Moderate at cath 2013  HTN- Reportedly labile in the past- he was unable to find his B/P cuff and did not remember what his B/P has been running  Dementia- Most of the history came from Derrick [name]  Recent back surgery- Feb 2020- residual leg weakness- his PCP is going to ask PT to re evaluate.    COVID-19 Education: The signs and symptoms of COVID-19 were discussed with the patient and how to seek care for testing (follow up with PCP or arrange E-visit).  The importance of social distancing was discussed  today.  Time:   Today, I have spent 20 minutes with the patient with telehealth technology discussing the above problems.     Medication Adjustments/Labs and Tests Ordered:  Current medicines are reviewed at length with the patient today.  Concerns regarding medicines are outlined above.   Tests Ordered: No orders of the defined types were placed in this encounter.   Medication Changes: No orders of the defined types were placed in this encounter.   Disposition:  Follow up Follow up with Dr [name] or his APP when the office opens  Signed, Kerin Ransom, [place]  02/06/2019 2:50 PM    [place]

## 2019-02-06 NOTE — Telephone Encounter (Signed)
Contacted patient to discuss AVS instructions. Patient notified and voiced understanding. 

## 2019-02-06 NOTE — Patient Instructions (Signed)
Medication Instructions:  Your physician recommends that you continue on your current medications as directed. Please refer to the Current Medication list given to you today. If you need a refill on your cardiac medications before your next appointment, please call your pharmacy.   Lab work: None  If you have labs (blood work) drawn today and your tests are completely normal, you will receive your results only by: Marland Kitchen MyChart Message (if you have MyChart) OR . A paper copy in the mail If you have any lab test that is abnormal or we need to change your treatment, we will call you to review the results.  Testing/Procedures: None   Follow-Up: At [facility], you and your health needs are our priority.  As part of our continuing mission to provide you with exceptional heart care, we have created designated Provider Care Teams.  These Care Teams include your primary Cardiologist (physician) and Advanced Practice Providers (APPs -  Physician Assistants and Nurse Practitioners) who all work together to provide you with the care you need, when you need it. You will need a follow up appointment in 3-4 months.  Please call our office 2 months in advance to schedule this appointment.  You may see Sanda Klein, MD or one of the following Advanced Practice Providers on your designated Care Team: [place], [place] . Fabian Sharp, PA-C  Any Other Special Instructions Will Be Listed Below (If Applicable).

## 2019-02-09 NOTE — Progress Notes (Signed)
I reviewed every ECG in the last 6 months. None of them are atrial fibrillation. Very frequent PACs. MCr

## 2019-02-11 ENCOUNTER — Encounter: Payer: Self-pay | Admitting: Cardiology

## 2019-02-11 NOTE — Progress Notes (Signed)
Past EKGs reviewed by Dr [name]- there was no evidence of atrial fibrillation.  Kerin Ransom PA-C 02/11/2019 8:04 AM

## 2019-02-11 NOTE — Progress Notes (Signed)
Visit diagnosis updated.  Kerin Ransom PA-C 02/11/2019 8:18 AM

## 2019-02-13 DIAGNOSIS — R0789 Other chest pain: Secondary | ICD-10-CM | POA: Diagnosis not present

## 2019-02-20 DIAGNOSIS — N179 Acute kidney failure, unspecified: Secondary | ICD-10-CM | POA: Diagnosis not present

## 2019-02-20 DIAGNOSIS — N183 Chronic kidney disease, stage 3 (moderate): Secondary | ICD-10-CM | POA: Diagnosis not present

## 2019-02-20 DIAGNOSIS — D631 Anemia in chronic kidney disease: Secondary | ICD-10-CM | POA: Diagnosis not present

## 2019-02-20 DIAGNOSIS — N189 Chronic kidney disease, unspecified: Secondary | ICD-10-CM | POA: Diagnosis not present

## 2019-02-20 DIAGNOSIS — I129 Hypertensive chronic kidney disease with stage 1 through stage 4 chronic kidney disease, or unspecified chronic kidney disease: Secondary | ICD-10-CM | POA: Diagnosis not present

## 2019-02-20 DIAGNOSIS — R809 Proteinuria, unspecified: Secondary | ICD-10-CM | POA: Diagnosis not present

## 2019-04-07 NOTE — Telephone Encounter (Signed)
This encounter was created in error - please disregard.

## 2019-04-09 ENCOUNTER — Other Ambulatory Visit: Payer: Self-pay

## 2019-04-09 NOTE — Patient Outreach (Signed)
[facility]) Care Management  04/09/2019  Derrick [name] [birth year] [id number]    Telephone Screen Referral Date :04/02/2019 Referral Source:[employer] High Risk Referral Reason:Screening to assess needs Insurance:[employer]  Outreach attempt #  1 to patient. [name] answered the phone answered the phone and stated this time would not be a good time to speak.  He was on his way out to the license plate office before it closed.  He asked if I could call him back.  He states calling in the morning is best for him.  Plan: RN Health Coach will make an outreach attempt to the patient with in four business days.  Lazaro Arms RN, BSN, [place] Direct Dial:  [phone] Fax: [phone]

## 2019-04-13 ENCOUNTER — Other Ambulatory Visit: Payer: Self-pay

## 2019-04-13 NOTE — Patient Outreach (Signed)
[facility]) Care Management  04/13/2019  Derrick [name] Mar 19, 1932 [id number]    Telephone Screen Referral Date :04/02/2019 Referral Source:[employer] High Risk Referral Reason:Screening to assess needs Insurance:[employer]   2nd attempt to outreach the patient for screening.  No answer.  HIPAA compliant voicemail left with contact information.  Plan: RN Health Coach will send letter.  RN Health Coach will make an outreach attempt to the patient within four business days.   Lazaro Arms RN, BSN, [place] Direct Dial:  [phone]  Fax: [phone]

## 2019-04-13 NOTE — Patient Outreach (Signed)
[facility]) Care Management  04/13/2019  Derrick [name] 30-May-1932 [id number]    Telephone Screen Referral Date :04/02/2019 Referral Source:[employer] High Risk Referral Reason:Screening to assess needs Insurance:[employer]   Return call from the patient for screening.  HIPAA verified.  Explained to the patient health coach role and [facility] services. Patient completed screening.  Social: The patient lives in the home with his wife. He states that he is independent/assist  with his ADLS/IADLS.  He states that he has transportation to his appointments.  He states that he has had a couple of days in the last three months he has fallen. He states that his legs give out.  He states that he has not had any injuries.  The equipment in the home includes: walker (four prong), cane, blood pressure cuff, eyeglasses, and dentures (upper and lower).  Conditions:  Per the patient he has high blood pressure that he feels he has control of.  He declines services at this time.  He states that he has one kidney and sometimes he may have problems.but nothing that constantly bothers him.   He denies any other problems.  Medications:The patient states that he is on around seven or eight medications.  His wife helps to prepare his medications and he denies any problems paying for his meds.   Plan: RN Health Coach will close the program at this time but will send the patient a letter with pamphlet for future reference. Patient thanked me for the call.   Lazaro Arms RN, BSN, [place] Direct Dial:  [phone] Fax: [phone]

## 2019-04-16 ENCOUNTER — Ambulatory Visit: Payer: Self-pay

## 2019-05-04 DIAGNOSIS — R29898 Other symptoms and signs involving the musculoskeletal system: Secondary | ICD-10-CM | POA: Diagnosis not present

## 2019-05-04 DIAGNOSIS — R238 Other skin changes: Secondary | ICD-10-CM | POA: Diagnosis not present

## 2019-05-04 DIAGNOSIS — M542 Cervicalgia: Secondary | ICD-10-CM | POA: Diagnosis not present

## 2019-05-04 DIAGNOSIS — H9192 Unspecified hearing loss, left ear: Secondary | ICD-10-CM | POA: Diagnosis not present

## 2019-05-14 DIAGNOSIS — H903 Sensorineural hearing loss, bilateral: Secondary | ICD-10-CM | POA: Diagnosis not present

## 2019-05-21 ENCOUNTER — Ambulatory Visit: Payer: Medicare Other | Admitting: Cardiovascular Disease

## 2019-06-12 DIAGNOSIS — Z23 Encounter for immunization: Secondary | ICD-10-CM | POA: Diagnosis not present

## 2019-07-04 ENCOUNTER — Emergency Department (HOSPITAL_COMMUNITY): Payer: Medicare Other

## 2019-07-04 ENCOUNTER — Emergency Department (HOSPITAL_COMMUNITY)
Admission: EM | Admit: 2019-07-04 | Discharge: 2019-07-04 | Disposition: A | Discharge status: Home / Self Care | Payer: Medicare Other | Attending: Emergency Medicine | Admitting: Emergency Medicine

## 2019-07-04 DIAGNOSIS — N183 Chronic kidney disease, stage 3 (moderate): Secondary | ICD-10-CM | POA: Insufficient documentation

## 2019-07-04 DIAGNOSIS — I2510 Atherosclerotic heart disease of native coronary artery without angina pectoris: Secondary | ICD-10-CM | POA: Diagnosis not present

## 2019-07-04 DIAGNOSIS — M79632 Pain in left forearm: Secondary | ICD-10-CM | POA: Diagnosis not present

## 2019-07-04 DIAGNOSIS — Z87891 Personal history of nicotine dependence: Secondary | ICD-10-CM | POA: Insufficient documentation

## 2019-07-04 DIAGNOSIS — Z85528 Personal history of other malignant neoplasm of kidney: Secondary | ICD-10-CM | POA: Insufficient documentation

## 2019-07-04 DIAGNOSIS — M069 Rheumatoid arthritis, unspecified: Secondary | ICD-10-CM | POA: Diagnosis not present

## 2019-07-04 DIAGNOSIS — Z7901 Long term (current) use of anticoagulants: Secondary | ICD-10-CM | POA: Insufficient documentation

## 2019-07-04 DIAGNOSIS — I129 Hypertensive chronic kidney disease with stage 1 through stage 4 chronic kidney disease, or unspecified chronic kidney disease: Secondary | ICD-10-CM | POA: Insufficient documentation

## 2019-07-04 DIAGNOSIS — J449 Chronic obstructive pulmonary disease, unspecified: Secondary | ICD-10-CM | POA: Insufficient documentation

## 2019-07-04 DIAGNOSIS — M79602 Pain in left arm: Secondary | ICD-10-CM | POA: Diagnosis not present

## 2019-07-04 DIAGNOSIS — M79642 Pain in left hand: Secondary | ICD-10-CM | POA: Diagnosis not present

## 2019-07-04 DIAGNOSIS — R000 Tachycardia, unspecified: Secondary | ICD-10-CM | POA: Diagnosis not present

## 2019-07-04 DIAGNOSIS — Z8546 Personal history of malignant neoplasm of prostate: Secondary | ICD-10-CM | POA: Diagnosis not present

## 2019-07-04 DIAGNOSIS — Z79899 Other long term (current) drug therapy: Secondary | ICD-10-CM | POA: Insufficient documentation

## 2019-07-04 LAB — APTT: aPTT: 26 seconds (ref 24–36)

## 2019-07-04 LAB — CBC WITH DIFFERENTIAL/PLATELET
Abs Immature Granulocytes: 0.03 10*3/uL (ref 0.00–0.07)
Basophils Absolute: 0 10*3/uL (ref 0.0–0.1)
Basophils Relative: 0 %
Eosinophils Absolute: 0.1 10*3/uL (ref 0.0–0.5)
Eosinophils Relative: 1 %
HCT: 37.2 % — ABNORMAL LOW (ref 39.0–52.0)
Hemoglobin: 11.6 g/dL — ABNORMAL LOW (ref 13.0–17.0)
Immature Granulocytes: 0 %
Lymphocytes Relative: 9 %
Lymphs Abs: 1 10*3/uL (ref 0.7–4.0)
MCH: 26.9 pg (ref 26.0–34.0)
MCHC: 31.2 g/dL (ref 30.0–36.0)
MCV: 86.1 fL (ref 80.0–100.0)
Monocytes Absolute: 1.1 10*3/uL — ABNORMAL HIGH (ref 0.1–1.0)
Monocytes Relative: 11 %
Neutro Abs: 8.3 10*3/uL — ABNORMAL HIGH (ref 1.7–7.7)
Neutrophils Relative %: 79 %
Platelets: 227 10*3/uL (ref 150–400)
RBC: 4.32 MIL/uL (ref 4.22–5.81)
RDW: 14.8 % (ref 11.5–15.5)
WBC: 10.5 10*3/uL (ref 4.0–10.5)
nRBC: 0 % (ref 0.0–0.2)

## 2019-07-04 LAB — PROTIME-INR
INR: 1.1 (ref 0.8–1.2)
Prothrombin Time: 13.6 seconds (ref 11.4–15.2)

## 2019-07-04 LAB — COMPREHENSIVE METABOLIC PANEL
ALT: 23 U/L (ref 0–44)
AST: 23 U/L (ref 15–41)
Albumin: 4 g/dL (ref 3.5–5.0)
Alkaline Phosphatase: 56 U/L (ref 38–126)
Anion gap: 11 (ref 5–15)
BUN: 15 mg/dL (ref 8–23)
CO2: 22 mmol/L (ref 22–32)
Calcium: 9.4 mg/dL (ref 8.9–10.3)
Chloride: 101 mmol/L (ref 98–111)
Creatinine, Ser: 1.22 mg/dL (ref 0.61–1.24)
GFR calc Af Amer: >60 mL/min (ref >60)
GFR calc non Af Amer: 54 mL/min — ABNORMAL LOW (ref >60)
Glucose, Bld: 141 mg/dL — ABNORMAL HIGH (ref 70–99)
Potassium: 4.4 mmol/L (ref 3.5–5.1)
Sodium: 134 mmol/L — ABNORMAL LOW (ref 135–145)
Total Bilirubin: 0.9 mg/dL (ref 0.3–1.2)
Total Protein: 7 g/dL (ref 6.5–8.1)

## 2019-07-04 LAB — LIPID PANEL
Cholesterol: 148 mg/dL (ref 0–200)
HDL: 83 mg/dL (ref >40)
LDL Cholesterol: 53 mg/dL (ref 0–99)
Total CHOL/HDL Ratio: 1.8 RATIO
Triglycerides: 61 mg/dL (ref <150)
VLDL: 12 mg/dL (ref 0–40)

## 2019-07-04 LAB — TROPONIN I (HIGH SENSITIVITY)
Troponin I (High Sensitivity): 14 ng/L (ref <18)
Troponin I (High Sensitivity): 14 ng/L (ref <18)

## 2019-07-04 MED ORDER — ONDANSETRON HCL 4 MG/2ML IJ SOLN
4.0000 mg | Freq: Once | INTRAMUSCULAR | Status: AC
  Administered 2019-07-04: 4 mg via INTRAVENOUS
  Filled 2019-07-04: qty 2

## 2019-07-04 MED ORDER — SODIUM CHLORIDE 0.9 % IV SOLN: INTRAVENOUS | Status: DC

## 2019-07-04 MED ORDER — OXYCODONE-ACETAMINOPHEN 5-325 MG PO TABS: 1.0000 | ORAL_TABLET | Freq: Three times a day (TID) | ORAL | 0 refills | Status: DC | PRN

## 2019-07-04 MED ORDER — MORPHINE SULFATE (PF) 4 MG/ML IV SOLN
4.0000 mg | Freq: Once | INTRAVENOUS | Status: AC
  Administered 2019-07-04: 4 mg via INTRAVENOUS
  Filled 2019-07-04: qty 1

## 2019-07-04 NOTE — ED Notes (Signed)
Spouse is waiting for pt to be roomed.  Informed her that we would call her upon his room assignment.

## 2019-07-04 NOTE — ED Triage Notes (Signed)
Pt reports waking with Left forearm pain. Denies any injury, able to wiggle fingers, increase pain with the slight bend of his Left wrist. Extremity warm to touch, palpable pulse

## 2019-07-04 NOTE — ED Provider Notes (Signed)
Medical screening examination/treatment/procedure(s) were conducted as a shared visit with non-physician practitioner(s) and myself.  I personally evaluated the patient during the encounter.  Clinical Impression:   Final diagnoses:  Left arm pain  Left hand pain    This patient is an 85 year old male, has history of rheumatoid arthritis as well as a history of coronary disease, chronic kidney disease status post nephrectomy for renal cell carcinoma, history of tremor.  He presents with a complaint of pain in his left upper extremity which he states has been slowly growing over the last 3 or 4 days, it became worse overnight and finally this afternoon he asked his wife to bring him to the hospital.  The pain extends from his elbow down towards his wrist, gets worse with any range of motion but is not associated with any swelling redness or fever.  There is been no trauma.  On exam the patient has a definite paleness to the left hand with a slowed capillary refill compared to the right side.  No other tenderness to his legs, arms, abdomen or chest, normal heart sounds, he is in atrial fibrillation.  Reportedly the patient takes Plavix but no other anticoagulants.  The patient does have dopplerable pulses bilaterally however his right upper extremity has much louder and stronger pulses than left side which is very quiet but auscultated with bedside Doppler, on my exam  Needs vascular consult,   Vascular recommends outpatient follow-up pcp, anticoagulants   Noemi Chapel, MD 07/05/19 1517

## 2019-07-04 NOTE — Discharge Instructions (Signed)
As we discussed, he should start taking daily aspirin.  Take pain medications as directed for break through pain. Do not drive or operate machinery while taking this medication.   Follow-up with referred vascular doctor.  Follow-up with your primary care doctor.  Return the emergency department for any worsening pain, redness or swelling of the arm, fevers or any other worsening or concerning symptoms.

## 2019-07-04 NOTE — ED Provider Notes (Signed)
[facility] EMERGENCY DEPARTMENT Provider Note   CSN: [id number] Arrival date & time: 07/04/19  1148     History   Chief Complaint Chief Complaint  Patient presents with   Arm Pain    HPI Derrick [name] is a 85 y.o. male who presents for evaluation of left wrist and thumb pain that began 3 days ago.  He denies any preceding trauma, injury, fall.  He states that he started having pain and has progressively worsened over the last 3 days.  He states it is most noticeable in the thenar eminence and the left thumb but he has had some radiation of pain to his LUE. He describes it as a "throbbing" type pain.  He states it is slightly worsened with movement of his wrist.  He has not noticed any overlying warmth or erythema.  He denies any numbness/tingling, nausea/vomiting.  He has not noted any fever, chest pain, difficulty breathing.      The history is provided by the patient.    Past Medical History:  Diagnosis Date   Anemia    Anxiety    Arthritis, rheumatoid (HCC)    BPH (benign prostatic hyperplasia)    CAD (coronary artery disease)    CKD (chronic kidney disease)    Depression    Diverticulosis    Elevated PSA    Gangrene (HCC)    of toe right foot   GERD (gastroesophageal reflux disease)    Herpes simplex labialis    High cholesterol    History of colonic polyps    Hypertension    Insomnia    Lacunar infarction ([place])    Memory loss    Obesity    Panic attacks    Prostate cancer ([place]) YRS AGO   Renal cell cancer ([place])    s/p nephrectomy in 2000   Seropositive rheumatoid arthritis ([place])    Spermatocele    Tinnitus    Tremor of both hands     Patient Active Problem List   Diagnosis Date Noted   New onset a-fib ([place]) 12/12/2018   Acute blood loss anemia 11/28/2018   Tachycardia 11/27/2018   Gross hematuria A999333   Acute metabolic encephalopathy A999333   Spinal stenosis, lumbar region with neurogenic  claudication 11/26/2018   Ulcer of right foot limited to breakdown of skin (HCC)    PAD (peripheral artery disease) ([place]) 09/23/2017   Syncope and collapse 09/19/2017   Osteomyelitis ([place])    Malaise 09/09/2017   Anorexia 09/09/2017   Hypotension 09/09/2017   Volume depletion 09/09/2017   AKI (acute kidney injury) ([place]) 09/09/2017   Hyponatremia 06/12/2017   COPD (chronic obstructive pulmonary disease) ([place])    Lacunar infarction ([place])    Gangrene of toe of right foot ([place])    Pre-syncope 11/21/2016   Dyspnea on exertion 03/03/2016   H/O unilateral Rt nephrectomy 03/03/2016   Asymmetrical right sensorineural hearing loss 02/24/2016   Bilateral hearing loss 02/24/2016   Bilateral impacted cerumen 02/24/2016   Arrhythmia 11/09/2015   CAD (coronary artery disease) 11/09/2015   Abnormal ECG 10/22/2013   Dyspnea 10/22/2013   Chest pain 11/18/2011   Unstable angina (HCC) 11/18/2011   Thrombocytopenia ([place]) 11/18/2011   Anemia 11/18/2011   CKD (chronic kidney disease), stage III ([name]) 11/18/2011   Hypertension    High cholesterol    GERD (gastroesophageal reflux disease)    BPH (benign prostatic hyperplasia)     Past Surgical History:  Procedure Laterality Date   AMPUTATION TOE  Right 06/13/2017   Procedure: AMPUTATION TOE 1st and 2nd;  Surgeon: [name], DPM;  Location: [facility];  Service: Podiatry;  Laterality: Right;   AMPUTATION TOE     RIGHT FOOT ALL TOES GONE   BACK SURGERY  YRS AGO   LOWER   COLONOSCOPY     GRAFT APPLICATION Right 0000000   Procedure: EPIFIX GRAFT APPLICATION OF RIGHT FOOT;  Surgeon: [name], DPM;  Location: [facility];  Service: Podiatry;  Laterality: Right;   I&D EXTREMITY Right 09/13/2017   Procedure: IRRIGATION AND DEBRIDEMENT AND BONE BIOPSY;  Surgeon: [name], DPM;  Location: [facility];  Service: Podiatry;  Laterality: Right;   INCISION AND DRAINAGE OF WOUND Right 12/18/2017   Procedure:  IRRIGATION AND DEBRIDEMENT WOUND ON RIGHT FOOT;  Surgeon: [name], DPM;  Location: [facility];  Service: Podiatry;  Laterality: Right;   IR ANGIOGRAM EXTREMITY RIGHT  05/30/2017   IR ANGIOGRAM EXTREMITY RIGHT  08/08/2017   IR ANGIOGRAM SELECTIVE EACH ADDITIONAL VESSEL  08/08/2017   IR ANGIOGRAM SELECTIVE EACH ADDITIONAL VESSEL  08/08/2017   IR RADIOLOGIST EVAL & MGMT  05/16/2017   IR TIB-PERO ART PTA MOD SED  05/30/2017   IR TIB-PERO ART PTA MOD SED  08/08/2017   IR TIB-PERO ART UNI PTA EA ADD VESSEL MOD SED  05/30/2017   IR TIB-PERO ART UNI PTA EA ADD VESSEL MOD SED  08/08/2017   IR US GUIDE VASC ACCESS RIGHT  05/30/2017   IR US GUIDE VASC ACCESS RIGHT  08/08/2017   LEFT HEART CATHETERIZATION WITH CORONARY ANGIOGRAM N/A 11/19/2011   Procedure: LEFT HEART CATHETERIZATION WITH CORONARY ANGIOGRAM;  Surgeon: [name], MD;  Location: [facility] CATH LAB;  Service: Cardiovascular;  Laterality: N/A;   LUMBAR LAMINECTOMY/DECOMPRESSION MICRODISCECTOMY N/A 11/26/2018   Procedure: Lumbar central decompression L2-3, L3-4;  Surgeon: [name], MD;  Location: [facility];  Service: Orthopedics;  Laterality: N/A;  157min   NEPHRECTOMY  10/1998   TRANSMETATARSAL AMPUTATION Right 08/14/2017   Procedure: TRANSMETATARSAL AMPUTATION;  Surgeon: [name], DPM;  Location: [facility];  Service: Podiatry;  Laterality: Right;        Home Medications    Prior to Admission medications   Medication Sig Start Date End Date Taking? Authorizing Provider  acetaminophen (TYLENOL) 500 MG tablet Take 1,000 mg by mouth every 6 (six) hours as needed for moderate pain or headache.    [provider]  amLODipine (NORVASC) 5 MG tablet Take 1 tablet (5 mg total) by mouth daily. 12/23/18   Croitoru, Mihai, MD  clopidogrel (PLAVIX) 75 MG tablet Take 75 mg by mouth daily.    [provider]  docusate sodium (COLACE) 100 MG capsule Take 100 mg by mouth daily.     [provider]  finasteride  (PROSCAR) 5 MG tablet Take 5 mg by mouth every evening.     [provider]  gabapentin (NEURONTIN) 100 MG capsule Take 1 capsule (100 mg total) by mouth at bedtime. 12/13/18   Geradine Girt, DO  HYDROcodone-acetaminophen (NORCO/VICODIN) 5-325 MG tablet Take 1 tablet by mouth every 6 (six) hours as needed for moderate pain ((score 4 to 6)). 12/13/18   Geradine Girt, DO  hydroxychloroquine (PLAQUENIL) 200 MG tablet Take 200 mg by mouth daily.    [provider]  Multiple Vitamin (MULTIVITAMIN WITH MINERALS) TABS tablet Take 1 tablet by mouth every evening.     [provider]  oxyCODONE-acetaminophen (PERCOCET/ROXICET) 5-325 MG tablet  Take 1-2 tablets by mouth every 8 (eight) hours as needed for severe pain. 07/04/19   Volanda Napoleon, PA-C  predniSONE (DELTASONE) 5 MG tablet Take 5 mg by mouth daily with breakfast.    [provider]  sertraline (ZOLOFT) 25 MG tablet Take 25 mg by mouth daily.    [provider]    Family History Family History  Problem Relation Age of Onset   Heart attack Father 21   Stroke Father    Kidney failure Sister        Bright's disease    Cancer Paternal Aunt        d. 55s; NOS cancer of leg   Kidney disease Sister        d. 69; "Bright's disease" - possible kidney cancer?   Cancer Sister        NOS cancer; d. 59s; "sick in the hospital"   Heart attack Son 81   Leukemia Son 76   COPD Son    Breast cancer Daughter 4    Social History Social History   Tobacco Use   Smoking status: Former Smoker    Packs/day: 1.00    Years: 29.00    Pack years: 29.00    Types: Cigarettes    Quit date: 10/08/1978    Years since quitting: 40.7   Smokeless tobacco: Never Used   Tobacco comment: tried smokeless tobacco once but made him sick  Substance Use Topics   Alcohol use: No   Drug use: No     Allergies   Patient has no known allergies.   Review of Systems Review of Systems  Constitutional:  Negative for fever.  Respiratory: Negative for shortness of breath.   Cardiovascular: Negative for chest pain.  Gastrointestinal: Negative for abdominal pain, nausea and vomiting.  Genitourinary: Negative for dysuria and hematuria.  Musculoskeletal:       Left wrist, hand pain  Skin: Negative for color change.  Neurological: Negative for weakness, numbness and headaches.  All other systems reviewed and are negative.    Physical Exam Updated Vital Signs BP 118/80    Pulse 77    Temp 98.2 F (36.8 C) (Oral)    Resp 18    Ht 6' (1.829 m)    Wt 74.8 kg    SpO2 97%    BMI 22.38 kg/m   Physical Exam Vitals signs and nursing note reviewed.  Constitutional:      Appearance: Normal appearance. He is well-developed.  HENT:     Head: Normocephalic and atraumatic.  Eyes:     General: Lids are normal.     Conjunctiva/sclera: Conjunctivae normal.     Pupils: Pupils are equal, round, and reactive to light.  Neck:     Musculoskeletal: Full passive range of motion without pain.  Cardiovascular:     Rate and Rhythm: Normal rate. Rhythm irregularly irregular.     Pulses:          Radial pulses are 2+ on the right side and 1+ on the left side.       Dorsalis pedis pulses are 2+ on the right side and 2+ on the left side.     Heart sounds: Normal heart sounds. No murmur. No friction rub. No gallop.      Comments: 2+ radial pulse and on the right.  1+ radial pulse noted on left. Pulmonary:     Effort: Pulmonary effort is normal.     Breath sounds: Normal breath sounds.  Comments: Lungs clear to auscultation bilaterally.  Symmetric chest rise.  No wheezing, rales, rhonchi. Abdominal:     Palpations: Abdomen is soft. Abdomen is not rigid.     Tenderness: There is no abdominal tenderness. There is no guarding.     Comments: Abdomen is soft, non-distended, non-tender. No rigidity, No guarding. No peritoneal signs.  Musculoskeletal: Normal range of motion.     Comments: Tenderness palpation  noted to the thenar eminence of left thumb and dorsal aspect of left hand.  No overlying warmth, erythema.  Tenderness worse with palpation and movement of the thumb and thenar eminence.  Mild tenderness palpation noted to left forearm.  No overlying warmth, erythema.  Compartments are soft.  No tenderness palpation of the left shoulder.  Skin:    General: Skin is warm and dry.     Capillary Refill: Capillary refill takes more than 3 seconds.     Comments: Slightly delayed cap refill noted to left thumb.  Is not dusky in appearance or cool to touch but is slightly paler than the rest of his digits.  Neurological:     Mental Status: He is alert and oriented to person, place, and time.  Psychiatric:        Speech: Speech normal.      ED Treatments / Results  Labs (all labs ordered are listed, but only abnormal results are displayed) Labs Reviewed  CBC WITH DIFFERENTIAL/PLATELET - Abnormal; Notable for the following components:      Result Value   Hemoglobin 11.6 (*)    HCT 37.2 (*)    Neutro Abs 8.3 (*)    Monocytes Absolute 1.1 (*)    All other components within normal limits  COMPREHENSIVE METABOLIC PANEL - Abnormal; Notable for the following components:   Sodium 134 (*)    Glucose, Bld 141 (*)    GFR calc non Af Amer 54 (*)    All other components within normal limits  PROTIME-INR  APTT  LIPID PANEL  TROPONIN I (HIGH SENSITIVITY)  TROPONIN I (HIGH SENSITIVITY)    EKG EKG Interpretation  Date/Time:  Saturday July 04 2019 12:11:11 EDT Ventricular Rate:  75 PR Interval:    QRS Duration: 96 QT Interval:  376 QTC Calculation: 419 R Axis:   25 Text Interpretation:  Sinus tachycardia with Blocked Premature atrial complexes ST & T wave abnormality, consider anterolateral ischemia Abnormal ECG T wave abnormality new c/w prior Confirmed by Noemi Chapel [phone]) on 07/04/2019 2:55:39 PM   Radiology Dg Forearm Left  Result Date: 07/04/2019 CLINICAL DATA:  85 year old male  with a history of left forearm pain EXAM: LEFT FOREARM - 2 VIEW COMPARISON:  None. FINDINGS: No acute displaced fracture. No focal soft tissue swelling. No radiopaque foreign body. Extensive medial calcinosis of the vasculature. Osteopenia. IMPRESSION: Negative for acute bony abnormality. Extensive medial calcinosis Electronically Signed   By: Corrie Mckusick D.O.   On: 07/04/2019 13:04    Procedures Procedures (including critical care time)  Medications Ordered in ED Medications  0.9 %  sodium chloride infusion (has no administration in time range)  morphine 4 MG/ML injection 4 mg (4 mg Intravenous Given 07/04/19 1543)  ondansetron (ZOFRAN) injection 4 mg (4 mg Intravenous Given 07/04/19 1543)     Initial Impression / Assessment and Plan / ED Course  I have reviewed the triage vital signs and the nursing notes.  Pertinent labs & imaging results that were available during my care of the patient were reviewed by me and  considered in my medical decision making (see chart for details).        85 year old male who presents for evaluation of left wrist and thumb pain x3 days.  No preceding trauma, injury.  No fevers, numbness/weakness. Patient is afebrile, non-toxic appearing, sitting comfortably on examination table. Vital signs reviewed and stable.  On exam, he has tenderness palpation noted to left thumb, left thenar eminence.  His thumb appears slightly paler and has slightly delayed cap refill but is not cool to touch or dusky in appearance.  He has radial pulse that is palpable but is slightly less than the left radial pulse.  Compartments are soft.  History/physical exam concerning for septic arthritis, DVT of left upper extremity, compartment syndrome.  He does appear to be in A. fib in the monitor.  Question if he is throwing small clots into the finger that is causing his pain.  Question if his pain is related to claudication vs vasospasm.  Initial labs ordered at triage.  Troponin is 14.   CMP is unremarkable.  CBC without any significant leukocytosis.  Hemoglobin is 11.6.  INR is 1.1.  Forearm x-ray is negative.  Review of patient's records.  He has been seen by Dr. Shelia Media ([facility]) for evaluation of palpitations.  He was thought to be in A. fib during admission in March but after review of EKGs, they felt it to be ectopy.  Suspect this is the reason that he was never started on any blood thinners.  Discussed patient with Dr. Percival Spanish ([facility]) reviewed patient's EKG.  He feels that it is sinus and does not believe that it to be A. fib.  No indications to anticoagulation at this time.  Discussed with Dr. Donnetta Hutching (Vascular).  Given that patient has pulse, does not feel he needs an acute arteriogram.  Do not feel this needs emergent vascular intervention.  He recommends outpatient vascular follow-up.  Given that he has cap refill and has a palpable pulse albeit slightly diminished compared to right, I feel that this is reasonable.  Discussed plan with patient.  He reports improvement in pain after analgesics.  Reevaluation shows no changes in skin coloration, skin temperature, pulses.  Instructed patient to start taking aspirin daily.  We will give him a short course of pain medication for home use.  Additionally, patient instructed to follow-up with vascular as directed. At this time, patient exhibits no emergent life-threatening condition that require further evaluation in ED or admission. Patient had ample opportunity for questions and discussion. All patient's questions were answered with full understanding. Strict return precautions discussed. Patient expresses understanding and agreement to plan.   CHA2DS2/VAS Stroke Risk Points  Current as of 5 minutes ago     4 >= 2 Points: High Risk  1 - 1.99 Points: Medium Risk  0 Points: Low Risk    The patient's score has not changed in the past year.: No Change     Details    This score determines the patient's risk of having a stroke if the    patient has atrial fibrillation.       Points Metrics  0 Has Congestive Heart Failure:  No    Current as of 5 minutes ago  1 Has Vascular Disease:  Yes    Current as of 5 minutes ago  1 Has Hypertension:  Yes    Current as of 5 minutes ago  2 Age:  86    Current as of 5 minutes ago  0 Has  Diabetes:  No    Current as of 5 minutes ago  0 Had Stroke:  No  Had TIA:  No  Had thromboembolism:  No    Current as of 5 minutes ago  0 Male:  No    Current as of 5 minutes ago      Portions of this note were generated with [employer]. Dictation errors may occur despite best attempts at proofreading.    Final Clinical Impressions(s) / ED Diagnoses   Final diagnoses:  Left arm pain  Left hand pain    ED Discharge Orders         Ordered    oxyCODONE-acetaminophen (PERCOCET/ROXICET) 5-325 MG tablet  Every 8 hours PRN     07/04/19 1627           Desma Mcgregor 07/04/19 1644    Noemi Chapel, MD 07/05/19 [phone]

## 2019-07-07 ENCOUNTER — Other Ambulatory Visit: Payer: Self-pay

## 2019-07-07 ENCOUNTER — Ambulatory Visit (INDEPENDENT_AMBULATORY_CARE_PROVIDER_SITE_OTHER): Payer: Medicare Other | Admitting: Vascular Surgery

## 2019-07-07 ENCOUNTER — Encounter: Payer: Self-pay | Admitting: Vascular Surgery

## 2019-07-07 DIAGNOSIS — M79602 Pain in left arm: Secondary | ICD-10-CM

## 2019-07-07 DIAGNOSIS — M79603 Pain in arm, unspecified: Secondary | ICD-10-CM | POA: Insufficient documentation

## 2019-07-07 NOTE — Progress Notes (Signed)
Patient name: Derrick [name] MRN: [id number] DOB: [birth year] Sex: male  REASON FOR CONSULT: Referral from the ED over the weekend, concern for decreased pulse at the left wrist  HPI: Derrick [name] is a 85 y.o. male, with multiple medical problems including peripheral vascular disease and a previous right TMA that presents for evaluation of his left upper extremity.  Patient initially presented to the ER this weekend when he started having pain in his left wrist and forearm.  Patient states the pain radiated down his forearm and was worse with range of motion.  He was felt to have slow capillary refill with Doppler signals and potential vascular etiology given his hand looked a little more pale. On follow-up today patient states his symptoms are 95% improved.  He has no numbness or tingling in the left hand and no motor weakness.  He is able to move his hand with good range of motion has some minor discomfort at the wrist when he does this. No previous upper extremity revascularization.  Past Medical History:  Diagnosis Date  . Anemia   . Anxiety   . Arthritis, rheumatoid ([place])   . BPH (benign prostatic hyperplasia)   . CAD (coronary artery disease)   . CKD (chronic kidney disease)   . Depression   . Diverticulosis   . Elevated PSA   . Gangrene ([place])    of toe right foot  . GERD (gastroesophageal reflux disease)   . Herpes simplex labialis   . High cholesterol   . History of colonic polyps   . Hypertension   . Insomnia   . Lacunar infarction ([place])   . Memory loss   . Obesity   . Panic attacks   . Prostate cancer ([place]) YRS AGO  . Renal cell cancer ([place])    s/p nephrectomy in 2000  . Seropositive rheumatoid arthritis ([place])   . Spermatocele   . Tinnitus   . Tremor of both hands     Past Surgical History:  Procedure Laterality Date  . AMPUTATION TOE Right 06/13/2017   Procedure: AMPUTATION TOE 1st and 2nd;  Surgeon: [name], DPM;  Location: [place];  Service: Podiatry;   Laterality: Right;  . AMPUTATION TOE     RIGHT FOOT ALL TOES GONE  . BACK SURGERY  YRS AGO   LOWER  . COLONOSCOPY    . GRAFT APPLICATION Right 0000000   Procedure: EPIFIX GRAFT APPLICATION OF RIGHT FOOT;  Surgeon: [name], DPM;  Location: [place];  Service: Podiatry;  Laterality: Right;  . I&D EXTREMITY Right 09/13/2017   Procedure: IRRIGATION AND DEBRIDEMENT AND BONE BIOPSY;  Surgeon: [name], DPM;  Location: [place];  Service: Podiatry;  Laterality: Right;  . INCISION AND DRAINAGE OF WOUND Right 12/18/2017   Procedure: IRRIGATION AND DEBRIDEMENT WOUND ON RIGHT FOOT;  Surgeon: [name], DPM;  Location: [place];  Service: Podiatry;  Laterality: Right;  . IR ANGIOGRAM EXTREMITY RIGHT  05/30/2017  . IR ANGIOGRAM EXTREMITY RIGHT  08/08/2017  . IR ANGIOGRAM SELECTIVE EACH ADDITIONAL VESSEL  08/08/2017  . IR ANGIOGRAM SELECTIVE EACH ADDITIONAL VESSEL  08/08/2017  . IR RADIOLOGIST EVAL & MGMT  05/16/2017  . IR TIB-PERO ART PTA MOD SED  05/30/2017  . IR TIB-PERO ART PTA MOD SED  08/08/2017  . IR TIB-PERO ART UNI PTA EA ADD VESSEL MOD SED  05/30/2017  . IR TIB-PERO ART UNI PTA EA ADD VESSEL MOD SED  08/08/2017  .  IR US GUIDE VASC ACCESS RIGHT  05/30/2017  . IR US GUIDE VASC ACCESS RIGHT  08/08/2017  . LEFT HEART CATHETERIZATION WITH CORONARY ANGIOGRAM N/A 11/19/2011   Procedure: LEFT HEART CATHETERIZATION WITH CORONARY ANGIOGRAM;  Surgeon: [name], MD;  Location: [facility];  Service: Cardiovascular;  Laterality: N/A;  . LUMBAR LAMINECTOMY/DECOMPRESSION MICRODISCECTOMY N/A 11/26/2018   Procedure: Lumbar central decompression L2-3, L3-4;  Surgeon: [name], MD;  Location: [facility];  Service: Orthopedics;  Laterality: N/A;  174min  . NEPHRECTOMY  10/1998  . TRANSMETATARSAL AMPUTATION Right 08/14/2017   Procedure: TRANSMETATARSAL AMPUTATION;  Surgeon: [name], DPM;  Location: [place];  Service: Podiatry;  Laterality: Right;    Family History  Problem Relation Age of  Onset  . Heart attack Father 26  . Stroke Father   . Kidney failure Sister        Bright's disease   . Cancer Paternal Aunt        d. 30s; NOS cancer of leg  . Kidney disease Sister        d. 61; "Bright's disease" - possible kidney cancer?  . Cancer Sister        NOS cancer; d. 69s; "sick in the hospital"  . Heart attack Son 43  . Leukemia Son 39  . COPD Son   . Breast cancer Daughter 75    SOCIAL HISTORY: Social History   Socioeconomic History  . Marital status: Married    Spouse name: Not on file  . Number of children: Not on file  . Years of education: Not on file  . Highest education level: Not on file  Occupational History  . Not on file  Social Needs  . Financial resource strain: Not on file  . Food insecurity    Worry: Not on file    Inability: Not on file  . Transportation needs    Medical: Not on file    Non-medical: Not on file  Tobacco Use  . Smoking status: Former Smoker    Packs/day: 1.00    Years: 29.00    Pack years: 29.00    Types: Cigarettes    Quit date: 10/08/1978    Years since quitting: 40.7  . Smokeless tobacco: Never Used  . Tobacco comment: tried smokeless tobacco once but made him sick  Substance and Sexual Activity  . Alcohol use: No  . Drug use: No  . Sexual activity: Not on file  Lifestyle  . Physical activity    Days per week: Not on file    Minutes per session: Not on file  . Stress: Not on file  Relationships  . Social Herbalist on phone: Not on file    Gets together: Not on file    Attends religious service: Not on file    Active member of club or organization: Not on file    Attends meetings of clubs or organizations: Not on file    Relationship status: Not on file  . Intimate partner violence    Fear of current or ex partner: Not on file    Emotionally abused: Not on file    Physically abused: Not on file    Forced sexual activity: Not on file  Other Topics Concern  . Not on file  Social History  Narrative   Retired   Married   4 children          No Known Allergies  Current Outpatient Medications  Medication Sig  Dispense Refill  . acetaminophen (TYLENOL) 500 MG tablet Take 1,000 mg by mouth every 6 (six) hours as needed for moderate pain or headache.    Marland Kitchen amLODipine (NORVASC) 5 MG tablet Take 1 tablet (5 mg total) by mouth daily. 30 tablet 6  . clopidogrel (PLAVIX) 75 MG tablet Take 75 mg by mouth daily.    Marland Kitchen docusate sodium (COLACE) 100 MG capsule Take 100 mg by mouth daily.     . finasteride (PROSCAR) 5 MG tablet Take 5 mg by mouth every evening.     Marland Kitchen HYDROcodone-acetaminophen (NORCO/VICODIN) 5-325 MG tablet Take 1 tablet by mouth every 6 (six) hours as needed for moderate pain ((score 4 to 6)). 10 tablet 0  . hydroxychloroquine (PLAQUENIL) 200 MG tablet Take 200 mg by mouth daily.    . Multiple Vitamin (MULTIVITAMIN WITH MINERALS) TABS tablet Take 1 tablet by mouth every evening.     . predniSONE (DELTASONE) 5 MG tablet Take 5 mg by mouth daily with breakfast.    . sertraline (ZOLOFT) 25 MG tablet Take 25 mg by mouth daily.    Marland Kitchen gabapentin (NEURONTIN) 100 MG capsule Take 1 capsule (100 mg total) by mouth at bedtime. (Patient not taking: Reported on 07/07/2019)    . oxyCODONE-acetaminophen (PERCOCET/ROXICET) 5-325 MG tablet Take 1-2 tablets by mouth every 8 (eight) hours as needed for severe pain. (Patient not taking: Reported on 07/07/2019) 8 tablet 0   No current facility-administered medications for this visit.     REVIEW OF SYSTEMS:  [X]  denotes positive finding, [ ]  denotes negative finding Cardiac  Comments:  Chest pain or chest pressure:    Shortness of breath upon exertion:    Short of breath when lying flat:    Irregular heart rhythm:        Vascular    Pain in calf, thigh, or hip brought on by ambulation:    Pain in feet at night that wakes you up from your sleep:     Blood clot in your veins:    Leg swelling:         Pulmonary    Oxygen at home:     Productive cough:     Wheezing:         Neurologic    Sudden weakness in arms or legs:     Sudden numbness in arms or legs:     Sudden onset of difficulty speaking or slurred speech:    Temporary loss of vision in one eye:     Problems with dizziness:         Gastrointestinal    Blood in stool:     Vomited blood:         Genitourinary    Burning when urinating:     Blood in urine:        Psychiatric    Major depression:         Hematologic    Bleeding problems:    Problems with blood clotting too easily:        Skin    Rashes or ulcers:        Constitutional    Fever or chills:      PHYSICAL EXAM: Vitals:   07/07/19 1123  BP: 125/74  Pulse: 87  Resp: 18  Temp: (!) 97.3 F (36.3 C)  TempSrc: Temporal  SpO2: 96%  Weight: 184 lb (83.5 kg)  Height: 5\' 8"  (1.727 m)    GENERAL: The patient is a well-nourished male, in no  acute distress. The vital signs are documented above. CARDIAC: There is a regular rate and rhythm.  VASCULAR:  Left brachial pulse palpable Brisk left ulnar signal more monophasic radial signal Fairly brisk palmar arch signal Left hand is motor and sensory intact Normal capillary refill  Hand is warm No distal ulcerations on hand PULMONARY: There is good air exchange bilaterally without wheezing or rales. ABDOMEN: Soft and non-tender with normal pitched bowel sounds.  MUSCULOSKELETAL: There are no major deformities or cyanosis. NEUROLOGIC: No focal weakness or paresthesias are detected.   DATA:   None  Assessment/Plan:  86 year old male with multiple medical problems that presents as an ER referral for evaluation of left upper extremity given concern for slow capillary refill and paleness in the hand over the weekend when he was seen for forearm and wrist pain. On evaluation today his capillary refill in the left hand is completely normal.  The hand is nice and warm.  He has very brisk ulnar signal with a weaker radial signal but has a  good signal in the palmar arch.  He has no motor or sensory deficits to suggest ischemia.  He has no distal ulcerations otherwise. I discussed I am not certain this is a vascular etiology.  I offered a left upper extremity arterial duplex here in clinic for completeness.  Patient states he does not want to wait for study and given that his symptoms are 95% resolved he would rather continue just monitor.  He will call our office if something worsens.   Marty Heck, MD [facility] Office: [phone] Pager: [phone]

## 2019-07-09 DIAGNOSIS — I2510 Atherosclerotic heart disease of native coronary artery without angina pectoris: Secondary | ICD-10-CM | POA: Diagnosis not present

## 2019-07-09 DIAGNOSIS — D649 Anemia, unspecified: Secondary | ICD-10-CM | POA: Diagnosis not present

## 2019-07-15 DIAGNOSIS — I10 Essential (primary) hypertension: Secondary | ICD-10-CM | POA: Diagnosis not present

## 2019-07-15 DIAGNOSIS — K219 Gastro-esophageal reflux disease without esophagitis: Secondary | ICD-10-CM | POA: Diagnosis not present

## 2019-07-15 DIAGNOSIS — E7800 Pure hypercholesterolemia, unspecified: Secondary | ICD-10-CM | POA: Diagnosis not present

## 2019-07-15 DIAGNOSIS — I2510 Atherosclerotic heart disease of native coronary artery without angina pectoris: Secondary | ICD-10-CM | POA: Diagnosis not present

## 2019-08-03 ENCOUNTER — Other Ambulatory Visit: Payer: Self-pay | Admitting: Cardiovascular Disease

## 2019-08-24 ENCOUNTER — Other Ambulatory Visit: Payer: Self-pay | Admitting: Cardiovascular Disease

## 2019-09-09 DIAGNOSIS — R260 Ataxic gait: Secondary | ICD-10-CM | POA: Diagnosis not present

## 2019-09-16 ENCOUNTER — Inpatient Hospital Stay (HOSPITAL_BASED_OUTPATIENT_CLINIC_OR_DEPARTMENT_OTHER)
Admission: EM | Admit: 2019-09-16 | Discharge: 2019-09-30 | DRG: 871 | Disposition: A | Discharge status: Skilled Nursing Facility | Payer: Medicare Other | Source: Ambulatory Visit | Attending: Internal Medicine | Admitting: Internal Medicine

## 2019-09-16 ENCOUNTER — Other Ambulatory Visit: Payer: Self-pay

## 2019-09-16 ENCOUNTER — Emergency Department (HOSPITAL_BASED_OUTPATIENT_CLINIC_OR_DEPARTMENT_OTHER): Payer: Medicare Other

## 2019-09-16 ENCOUNTER — Encounter (HOSPITAL_BASED_OUTPATIENT_CLINIC_OR_DEPARTMENT_OTHER): Payer: Self-pay | Admitting: Emergency Medicine

## 2019-09-16 DIAGNOSIS — Z806 Family history of leukemia: Secondary | ICD-10-CM | POA: Diagnosis not present

## 2019-09-16 DIAGNOSIS — M869 Osteomyelitis, unspecified: Secondary | ICD-10-CM | POA: Diagnosis not present

## 2019-09-16 DIAGNOSIS — F0390 Unspecified dementia without behavioral disturbance: Secondary | ICD-10-CM | POA: Diagnosis present

## 2019-09-16 DIAGNOSIS — A4151 Sepsis due to Escherichia coli [E. coli]: Secondary | ICD-10-CM | POA: Diagnosis not present

## 2019-09-16 DIAGNOSIS — R1033 Periumbilical pain: Secondary | ICD-10-CM

## 2019-09-16 DIAGNOSIS — I4821 Permanent atrial fibrillation: Secondary | ICD-10-CM | POA: Diagnosis not present

## 2019-09-16 DIAGNOSIS — K819 Cholecystitis, unspecified: Secondary | ICD-10-CM

## 2019-09-16 DIAGNOSIS — R7881 Bacteremia: Secondary | ICD-10-CM | POA: Diagnosis present

## 2019-09-16 DIAGNOSIS — B9620 Unspecified Escherichia coli [E. coli] as the cause of diseases classified elsewhere: Secondary | ICD-10-CM | POA: Diagnosis present

## 2019-09-16 DIAGNOSIS — I371 Nonrheumatic pulmonary valve insufficiency: Secondary | ICD-10-CM | POA: Diagnosis not present

## 2019-09-16 DIAGNOSIS — Z823 Family history of stroke: Secondary | ICD-10-CM | POA: Diagnosis not present

## 2019-09-16 DIAGNOSIS — N183 Chronic kidney disease, stage 3 unspecified: Secondary | ICD-10-CM | POA: Diagnosis present

## 2019-09-16 DIAGNOSIS — Z905 Acquired absence of kidney: Secondary | ICD-10-CM

## 2019-09-16 DIAGNOSIS — R410 Disorientation, unspecified: Secondary | ICD-10-CM | POA: Diagnosis not present

## 2019-09-16 DIAGNOSIS — R7401 Elevation of levels of liver transaminase levels: Secondary | ICD-10-CM | POA: Diagnosis not present

## 2019-09-16 DIAGNOSIS — K810 Acute cholecystitis: Secondary | ICD-10-CM | POA: Diagnosis present

## 2019-09-16 DIAGNOSIS — Z20828 Contact with and (suspected) exposure to other viral communicable diseases: Secondary | ICD-10-CM | POA: Diagnosis present

## 2019-09-16 DIAGNOSIS — E785 Hyperlipidemia, unspecified: Secondary | ICD-10-CM | POA: Diagnosis not present

## 2019-09-16 DIAGNOSIS — Z8673 Personal history of transient ischemic attack (TIA), and cerebral infarction without residual deficits: Secondary | ICD-10-CM | POA: Diagnosis not present

## 2019-09-16 DIAGNOSIS — N179 Acute kidney failure, unspecified: Secondary | ICD-10-CM | POA: Diagnosis not present

## 2019-09-16 DIAGNOSIS — R2689 Other abnormalities of gait and mobility: Secondary | ICD-10-CM | POA: Diagnosis not present

## 2019-09-16 DIAGNOSIS — R109 Unspecified abdominal pain: Secondary | ICD-10-CM | POA: Diagnosis not present

## 2019-09-16 DIAGNOSIS — I4891 Unspecified atrial fibrillation: Secondary | ICD-10-CM | POA: Diagnosis not present

## 2019-09-16 DIAGNOSIS — Z7401 Bed confinement status: Secondary | ICD-10-CM | POA: Diagnosis not present

## 2019-09-16 DIAGNOSIS — G9341 Metabolic encephalopathy: Secondary | ICD-10-CM | POA: Diagnosis present

## 2019-09-16 DIAGNOSIS — M069 Rheumatoid arthritis, unspecified: Secondary | ICD-10-CM | POA: Diagnosis present

## 2019-09-16 DIAGNOSIS — K820 Obstruction of gallbladder: Secondary | ICD-10-CM | POA: Diagnosis present

## 2019-09-16 DIAGNOSIS — R112 Nausea with vomiting, unspecified: Secondary | ICD-10-CM | POA: Diagnosis not present

## 2019-09-16 DIAGNOSIS — Z7902 Long term (current) use of antithrombotics/antiplatelets: Secondary | ICD-10-CM | POA: Diagnosis not present

## 2019-09-16 DIAGNOSIS — I2510 Atherosclerotic heart disease of native coronary artery without angina pectoris: Secondary | ICD-10-CM | POA: Diagnosis present

## 2019-09-16 DIAGNOSIS — F05 Delirium due to known physiological condition: Secondary | ICD-10-CM | POA: Diagnosis not present

## 2019-09-16 DIAGNOSIS — T85520A Displacement of bile duct prosthesis, initial encounter: Secondary | ICD-10-CM

## 2019-09-16 DIAGNOSIS — R1030 Lower abdominal pain, unspecified: Secondary | ICD-10-CM | POA: Diagnosis not present

## 2019-09-16 DIAGNOSIS — I480 Paroxysmal atrial fibrillation: Secondary | ICD-10-CM | POA: Diagnosis not present

## 2019-09-16 DIAGNOSIS — D638 Anemia in other chronic diseases classified elsewhere: Secondary | ICD-10-CM | POA: Diagnosis not present

## 2019-09-16 DIAGNOSIS — D649 Anemia, unspecified: Secondary | ICD-10-CM | POA: Diagnosis present

## 2019-09-16 DIAGNOSIS — N400 Enlarged prostate without lower urinary tract symptoms: Secondary | ICD-10-CM | POA: Diagnosis present

## 2019-09-16 DIAGNOSIS — K760 Fatty (change of) liver, not elsewhere classified: Secondary | ICD-10-CM | POA: Diagnosis not present

## 2019-09-16 DIAGNOSIS — Z87891 Personal history of nicotine dependence: Secondary | ICD-10-CM | POA: Diagnosis not present

## 2019-09-16 DIAGNOSIS — I739 Peripheral vascular disease, unspecified: Secondary | ICD-10-CM | POA: Diagnosis present

## 2019-09-16 DIAGNOSIS — Z79899 Other long term (current) drug therapy: Secondary | ICD-10-CM | POA: Diagnosis not present

## 2019-09-16 DIAGNOSIS — I10 Essential (primary) hypertension: Secondary | ICD-10-CM | POA: Diagnosis not present

## 2019-09-16 DIAGNOSIS — E7800 Pure hypercholesterolemia, unspecified: Secondary | ICD-10-CM | POA: Diagnosis not present

## 2019-09-16 DIAGNOSIS — Z7952 Long term (current) use of systemic steroids: Secondary | ICD-10-CM | POA: Diagnosis not present

## 2019-09-16 DIAGNOSIS — K219 Gastro-esophageal reflux disease without esophagitis: Secondary | ICD-10-CM | POA: Diagnosis present

## 2019-09-16 DIAGNOSIS — Z781 Physical restraint status: Secondary | ICD-10-CM

## 2019-09-16 DIAGNOSIS — R509 Fever, unspecified: Secondary | ICD-10-CM | POA: Diagnosis not present

## 2019-09-16 DIAGNOSIS — I340 Nonrheumatic mitral (valve) insufficiency: Secondary | ICD-10-CM | POA: Diagnosis not present

## 2019-09-16 DIAGNOSIS — F410 Panic disorder [episodic paroxysmal anxiety] without agoraphobia: Secondary | ICD-10-CM | POA: Diagnosis present

## 2019-09-16 DIAGNOSIS — N189 Chronic kidney disease, unspecified: Secondary | ICD-10-CM

## 2019-09-16 DIAGNOSIS — Z743 Need for continuous supervision: Secondary | ICD-10-CM | POA: Diagnosis not present

## 2019-09-16 DIAGNOSIS — K8309 Other cholangitis: Secondary | ICD-10-CM | POA: Insufficient documentation

## 2019-09-16 DIAGNOSIS — Z8546 Personal history of malignant neoplasm of prostate: Secondary | ICD-10-CM

## 2019-09-16 DIAGNOSIS — A419 Sepsis, unspecified organism: Secondary | ICD-10-CM | POA: Diagnosis not present

## 2019-09-16 DIAGNOSIS — R1010 Upper abdominal pain, unspecified: Secondary | ICD-10-CM | POA: Diagnosis not present

## 2019-09-16 DIAGNOSIS — R1084 Generalized abdominal pain: Secondary | ICD-10-CM | POA: Diagnosis not present

## 2019-09-16 DIAGNOSIS — Z89421 Acquired absence of other right toe(s): Secondary | ICD-10-CM

## 2019-09-16 DIAGNOSIS — M2550 Pain in unspecified joint: Secondary | ICD-10-CM | POA: Diagnosis not present

## 2019-09-16 DIAGNOSIS — Z4682 Encounter for fitting and adjustment of non-vascular catheter: Secondary | ICD-10-CM | POA: Diagnosis not present

## 2019-09-16 DIAGNOSIS — R6520 Severe sepsis without septic shock: Secondary | ICD-10-CM | POA: Diagnosis not present

## 2019-09-16 DIAGNOSIS — R1915 Other abnormal bowel sounds: Secondary | ICD-10-CM | POA: Diagnosis not present

## 2019-09-16 DIAGNOSIS — Z85528 Personal history of other malignant neoplasm of kidney: Secondary | ICD-10-CM | POA: Diagnosis not present

## 2019-09-16 DIAGNOSIS — R932 Abnormal findings on diagnostic imaging of liver and biliary tract: Secondary | ICD-10-CM | POA: Diagnosis not present

## 2019-09-16 DIAGNOSIS — G4700 Insomnia, unspecified: Secondary | ICD-10-CM | POA: Diagnosis not present

## 2019-09-16 DIAGNOSIS — D500 Iron deficiency anemia secondary to blood loss (chronic): Secondary | ICD-10-CM | POA: Diagnosis not present

## 2019-09-16 DIAGNOSIS — S98211D Complete traumatic amputation of two or more right lesser toes, subsequent encounter: Secondary | ICD-10-CM | POA: Diagnosis not present

## 2019-09-16 DIAGNOSIS — R000 Tachycardia, unspecified: Secondary | ICD-10-CM | POA: Diagnosis not present

## 2019-09-16 DIAGNOSIS — I129 Hypertensive chronic kidney disease with stage 1 through stage 4 chronic kidney disease, or unspecified chronic kidney disease: Secondary | ICD-10-CM | POA: Diagnosis present

## 2019-09-16 DIAGNOSIS — E876 Hypokalemia: Secondary | ICD-10-CM | POA: Diagnosis not present

## 2019-09-16 DIAGNOSIS — R1011 Right upper quadrant pain: Secondary | ICD-10-CM | POA: Diagnosis not present

## 2019-09-16 DIAGNOSIS — G9340 Encephalopathy, unspecified: Secondary | ICD-10-CM | POA: Diagnosis not present

## 2019-09-16 DIAGNOSIS — M6281 Muscle weakness (generalized): Secondary | ICD-10-CM | POA: Diagnosis not present

## 2019-09-16 LAB — CBC WITH DIFFERENTIAL/PLATELET
Abs Immature Granulocytes: 0.11 10*3/uL — ABNORMAL HIGH (ref 0.00–0.07)
Basophils Absolute: 0 10*3/uL (ref 0.0–0.1)
Basophils Relative: 0 %
Eosinophils Absolute: 0 10*3/uL (ref 0.0–0.5)
Eosinophils Relative: 0 %
HCT: 36.6 % — ABNORMAL LOW (ref 39.0–52.0)
Hemoglobin: 11.3 g/dL — ABNORMAL LOW (ref 13.0–17.0)
Immature Granulocytes: 1 %
Lymphocytes Relative: 5 %
Lymphs Abs: 0.9 10*3/uL (ref 0.7–4.0)
MCH: 25.5 pg — ABNORMAL LOW (ref 26.0–34.0)
MCHC: 30.9 g/dL (ref 30.0–36.0)
MCV: 82.4 fL (ref 80.0–100.0)
Monocytes Absolute: 1.5 10*3/uL — ABNORMAL HIGH (ref 0.1–1.0)
Monocytes Relative: 9 %
Neutro Abs: 15 10*3/uL — ABNORMAL HIGH (ref 1.7–7.7)
Neutrophils Relative %: 85 %
Platelets: 277 10*3/uL (ref 150–400)
RBC: 4.44 MIL/uL (ref 4.22–5.81)
RDW: 15.7 % — ABNORMAL HIGH (ref 11.5–15.5)
WBC: 17.6 10*3/uL — ABNORMAL HIGH (ref 4.0–10.5)
nRBC: 0 % (ref 0.0–0.2)

## 2019-09-16 LAB — COMPREHENSIVE METABOLIC PANEL
ALT: 159 U/L — ABNORMAL HIGH (ref 0–44)
AST: 118 U/L — ABNORMAL HIGH (ref 15–41)
Albumin: 3.6 g/dL (ref 3.5–5.0)
Alkaline Phosphatase: 112 U/L (ref 38–126)
Anion gap: 9 (ref 5–15)
BUN: 15 mg/dL (ref 8–23)
CO2: 22 mmol/L (ref 22–32)
Calcium: 9.2 mg/dL (ref 8.9–10.3)
Chloride: 101 mmol/L (ref 98–111)
Creatinine, Ser: 1.06 mg/dL (ref 0.61–1.24)
GFR calc Af Amer: >60 mL/min (ref >60)
GFR calc non Af Amer: >60 mL/min (ref >60)
Glucose, Bld: 184 mg/dL — ABNORMAL HIGH (ref 70–99)
Potassium: 4.2 mmol/L (ref 3.5–5.1)
Sodium: 132 mmol/L — ABNORMAL LOW (ref 135–145)
Total Bilirubin: 1.1 mg/dL (ref 0.3–1.2)
Total Protein: 7 g/dL (ref 6.5–8.1)

## 2019-09-16 LAB — URINALYSIS, MICROSCOPIC (REFLEX): WBC, UA: NONE SEEN WBC/hpf (ref 0–5)

## 2019-09-16 LAB — URINALYSIS, ROUTINE W REFLEX MICROSCOPIC
Bilirubin Urine: NEGATIVE
Glucose, UA: NEGATIVE mg/dL
Ketones, ur: NEGATIVE mg/dL
Leukocytes,Ua: NEGATIVE
Nitrite: NEGATIVE
Protein, ur: 100 mg/dL — AB
Specific Gravity, Urine: 1.01 (ref 1.005–1.030)
pH: 7 (ref 5.0–8.0)

## 2019-09-16 LAB — LACTIC ACID, PLASMA
Lactic Acid, Venous: 2.9 mmol/L (ref 0.5–1.9)
Lactic Acid, Venous: 1.9 mmol/L (ref 0.5–1.9)

## 2019-09-16 LAB — SARS CORONAVIRUS 2 (TAT 6-24 HRS): SARS Coronavirus 2: NEGATIVE

## 2019-09-16 LAB — LIPASE, BLOOD: Lipase: 24 U/L (ref 11–51)

## 2019-09-16 MED ORDER — PIPERACILLIN-TAZOBACTAM 3.375 G IVPB
3.3750 g | Freq: Three times a day (TID) | INTRAVENOUS | Status: DC
  Administered 2019-09-17 – 2019-09-29 (×38): 3.375 g via INTRAVENOUS
  Filled 2019-09-16 (×36): qty 50

## 2019-09-16 MED ORDER — SODIUM CHLORIDE 0.9 % IV BOLUS
500.0000 mL | Freq: Once | INTRAVENOUS | Status: AC
  Administered 2019-09-16: 500 mL via INTRAVENOUS

## 2019-09-16 MED ORDER — IOHEXOL 300 MG/ML  SOLN
100.0000 mL | Freq: Once | INTRAMUSCULAR | Status: AC | PRN
  Administered 2019-09-16: 100 mL via INTRAVENOUS

## 2019-09-16 MED ORDER — SODIUM CHLORIDE 0.9 % IV SOLN
INTRAVENOUS | Status: DC | PRN
  Administered 2019-09-16: 13:00:00 via INTRAVENOUS

## 2019-09-16 MED ORDER — LORAZEPAM 2 MG/ML IJ SOLN
1.0000 mg | Freq: Once | INTRAMUSCULAR | Status: AC
  Administered 2019-09-16: 1 mg via INTRAVENOUS
  Filled 2019-09-16: qty 1

## 2019-09-16 MED ORDER — ACETAMINOPHEN 650 MG RE SUPP
650.0000 mg | Freq: Once | RECTAL | Status: AC
  Administered 2019-09-16: 650 mg via RECTAL
  Filled 2019-09-16: qty 1

## 2019-09-16 MED ORDER — HYDROCORTISONE NA SUCCINATE PF 100 MG IJ SOLR
50.0000 mg | Freq: Three times a day (TID) | INTRAMUSCULAR | Status: DC
  Administered 2019-09-17 – 2019-09-18 (×5): 50 mg via INTRAVENOUS
  Filled 2019-09-16 (×5): qty 2

## 2019-09-16 MED ORDER — HALOPERIDOL LACTATE 5 MG/ML IJ SOLN
2.0000 mg | Freq: Once | INTRAMUSCULAR | Status: AC
  Administered 2019-09-16: 2 mg via INTRAMUSCULAR

## 2019-09-16 MED ORDER — HYDROXYCHLOROQUINE SULFATE 200 MG PO TABS: 200.0000 mg | ORAL_TABLET | Freq: Every day | ORAL | Status: DC

## 2019-09-16 MED ORDER — PIPERACILLIN-TAZOBACTAM 3.375 G IVPB 30 MIN
3.3750 g | Freq: Once | INTRAVENOUS | Status: AC
  Administered 2019-09-16: 13:00:00 3.375 g via INTRAVENOUS
  Filled 2019-09-16 (×2): qty 50

## 2019-09-16 MED ORDER — ONDANSETRON HCL 4 MG PO TABS: 4.0000 mg | ORAL_TABLET | Freq: Four times a day (QID) | ORAL | Status: DC | PRN

## 2019-09-16 MED ORDER — ONDANSETRON HCL 4 MG/2ML IJ SOLN: 4.0000 mg | Freq: Four times a day (QID) | INTRAMUSCULAR | Status: DC | PRN

## 2019-09-16 MED ORDER — LACTATED RINGERS IV SOLN
INTRAVENOUS | Status: DC
  Administered 2019-09-16 – 2019-09-21 (×9): via INTRAVENOUS

## 2019-09-16 MED ORDER — ACETAMINOPHEN 650 MG RE SUPP
650.0000 mg | Freq: Four times a day (QID) | RECTAL | Status: DC | PRN
  Administered 2019-09-17 – 2019-09-18 (×2): 650 mg via RECTAL
  Filled 2019-09-16 (×2): qty 1

## 2019-09-16 MED ORDER — ACETAMINOPHEN 325 MG PO TABS
650.0000 mg | ORAL_TABLET | Freq: Four times a day (QID) | ORAL | Status: DC | PRN
  Administered 2019-09-27: 650 mg via ORAL
  Filled 2019-09-16: qty 2

## 2019-09-16 MED ORDER — MORPHINE SULFATE (PF) 4 MG/ML IV SOLN
4.0000 mg | Freq: Once | INTRAVENOUS | Status: AC
  Administered 2019-09-16: 4 mg via INTRAVENOUS
  Filled 2019-09-16: qty 1

## 2019-09-16 MED ORDER — PREDNISONE 5 MG PO TABS: 15.0000 mg | ORAL_TABLET | Freq: Every day | ORAL | Status: DC

## 2019-09-16 MED ORDER — ENOXAPARIN SODIUM 40 MG/0.4ML ~~LOC~~ SOLN
40.0000 mg | Freq: Every day | SUBCUTANEOUS | Status: DC
  Administered 2019-09-17: 40 mg via SUBCUTANEOUS
  Filled 2019-09-16 (×2): qty 0.4

## 2019-09-16 MED ORDER — ONDANSETRON HCL 4 MG/2ML IJ SOLN
4.0000 mg | Freq: Once | INTRAMUSCULAR | Status: AC
  Administered 2019-09-16: 4 mg via INTRAVENOUS
  Filled 2019-09-16: qty 2

## 2019-09-16 MED ORDER — HALOPERIDOL LACTATE 5 MG/ML IJ SOLN
INTRAMUSCULAR | Status: AC
  Filled 2019-09-16: qty 1

## 2019-09-16 MED ORDER — ACETAMINOPHEN 500 MG PO TABS: 1000.0000 mg | ORAL_TABLET | Freq: Once | ORAL | Status: DC

## 2019-09-16 NOTE — ED Notes (Signed)
Wife given pt's room number and informed of transport by telephone

## 2019-09-16 NOTE — ED Notes (Signed)
Pt on monitor 

## 2019-09-16 NOTE — Progress Notes (Signed)
Pharmacy Antibiotic Note  Derrick [name] is a 85 y.o. male admitted on 09/16/2019 with intra-abdominal infection.  Pharmacy has been consulted for zosyn dosing.  Plan: Zosyn 3.375g IV q8h (4 hour infusion).  F/u scr/cultures  Height: 6' (182.9 cm) Weight: 178 lb 1.6 oz (80.8 kg) IBW/kg (Calculated) : 77.6  Temp (24hrs), Avg:99.5 F (37.5 C), Min:98.6 F (37 C), Max:101.6 F (38.7 C)  Recent Labs  Lab 09/16/19 1113 09/16/19 1239 09/16/19 1446  WBC 17.6*  --   --   CREATININE 1.06  --   --   LATICACIDVEN  --  2.9* 1.9    Estimated Creatinine Clearance: 56.9 mL/min (by C-G formula based on SCr of 1.06 mg/dL).    No Known Allergies  Antimicrobials this admission: 12/9 zosyn >>    >>   Dose adjustments this admission:   Microbiology results:  BCx:   UCx:    Sputum:    MRSA PCR:   Thank you for allowing pharmacy to be a part of this patient's care.  Dorrene German 09/16/2019 11:51 PM

## 2019-09-16 NOTE — ED Notes (Signed)
Report to [facility].  

## 2019-09-16 NOTE — ED Triage Notes (Signed)
Low midline abdominal pain x3 days.  Vomited x1 2 days ago. Denies fever.  No BM x3 days. Saw pmd this morning for same and was sent to Ed for eval.

## 2019-09-16 NOTE — H&P (Signed)
History and Physical    Derrick [name] [id number] DOB: Feb 14, 1932 DOA: 09/16/2019  PCP: [name], MD  Patient coming from: Home  I have personally briefly reviewed patient's old medical records in [place]  Chief Complaint: Abd pain, N/V  HPI: Derrick [name] is a 85 y.o. male with medical history significant of RA on prednisone and hydroxychloroquine, CAD and PAD, HTN.  Patient presented to [facility] ED with c/o periumbilical to lower abd pain, onset 3 days ago.  Associated with N/V.  No change in BM.  No urinary complaints.  Went to PCP today and referred to ED.   ED Course: In the ED TTP periumbilically and suprapubically without rebound or guarding.  He is septic with WBC 17k, Tm 101.6, HR 134.  Not hypotensive yet though.  Given 2L bolus.  LFTs noted to be significantly elevated with AST 118 and ALT 159.  COVID RT-PCR is negative.  Initial lactate 2.9, improves to 1.9 after IVF.  CT abd / pelvis: 1) lung bases are clear 2) findings worrisome for acute cholecystitis  [place] abd: neg for acute cholecystitis.  gen surg consulted: put on ABx, admit, get HIDA, and consult them if positive.  Although EDP note nor transfer note makes any mention of encephalopathy while in ED, by the time he arrives to the SDU here at [facility], he is encephalopathic.   Review of Systems: Unable to perform due to AMS.  Past Medical History:  Diagnosis Date  . Anemia   . Anxiety   . Arthritis, rheumatoid ([place])   . BPH (benign prostatic hyperplasia)   . CAD (coronary artery disease)   . CKD (chronic kidney disease)   . Depression   . Diverticulosis   . Elevated PSA   . Gangrene ([place])    of toe right foot  . GERD (gastroesophageal reflux disease)   . Herpes simplex labialis   . High cholesterol   . History of colonic polyps   . Hypertension   . Insomnia   . Lacunar infarction ([place])   . Memory loss   . Obesity   . Panic attacks   . Prostate cancer ([place]) YRS AGO  . Renal cell cancer  ([place])    s/p nephrectomy in 2000  . Seropositive rheumatoid arthritis ([place])   . Spermatocele   . Tinnitus   . Tremor of both hands     Past Surgical History:  Procedure Laterality Date  . AMPUTATION TOE Right 06/13/2017   Procedure: AMPUTATION TOE 1st and 2nd;  Surgeon: [name], DPM;  Location: [place];  Service: Podiatry;  Laterality: Right;  . AMPUTATION TOE     RIGHT FOOT ALL TOES GONE  . BACK SURGERY  YRS AGO   LOWER  . COLONOSCOPY    . GRAFT APPLICATION Right 0000000   Procedure: EPIFIX GRAFT APPLICATION OF RIGHT FOOT;  Surgeon: [name], DPM;  Location: [place];  Service: Podiatry;  Laterality: Right;  . I&D EXTREMITY Right 09/13/2017   Procedure: IRRIGATION AND DEBRIDEMENT AND BONE BIOPSY;  Surgeon: [name], DPM;  Location: [place];  Service: Podiatry;  Laterality: Right;  . INCISION AND DRAINAGE OF WOUND Right 12/18/2017   Procedure: IRRIGATION AND DEBRIDEMENT WOUND ON RIGHT FOOT;  Surgeon: [name], DPM;  Location: [place];  Service: Podiatry;  Laterality: Right;  . IR ANGIOGRAM EXTREMITY RIGHT  05/30/2017  . IR ANGIOGRAM EXTREMITY RIGHT  08/08/2017  . IR ANGIOGRAM SELECTIVE EACH ADDITIONAL VESSEL  08/08/2017  .  IR ANGIOGRAM SELECTIVE EACH ADDITIONAL VESSEL  08/08/2017  . IR RADIOLOGIST EVAL & MGMT  05/16/2017  . IR TIB-PERO ART PTA MOD SED  05/30/2017  . IR TIB-PERO ART PTA MOD SED  08/08/2017  . IR TIB-PERO ART UNI PTA EA ADD VESSEL MOD SED  05/30/2017  . IR TIB-PERO ART UNI PTA EA ADD VESSEL MOD SED  08/08/2017  . IR US GUIDE VASC ACCESS RIGHT  05/30/2017  . IR US GUIDE VASC ACCESS RIGHT  08/08/2017  . LEFT HEART CATHETERIZATION WITH CORONARY ANGIOGRAM N/A 11/19/2011   Procedure: LEFT HEART CATHETERIZATION WITH CORONARY ANGIOGRAM;  Surgeon: [name], MD;  Location: [facility] CATH [facility];  Service: Cardiovascular;  Laterality: N/A;  . LUMBAR LAMINECTOMY/DECOMPRESSION MICRODISCECTOMY N/A 11/26/2018   Procedure: Lumbar central decompression L2-3, L3-4;  Surgeon:  [name], MD;  Location: [facility];  Service: Orthopedics;  Laterality: N/A;  179min  . NEPHRECTOMY  10/1998  . TRANSMETATARSAL AMPUTATION Right 08/14/2017   Procedure: TRANSMETATARSAL AMPUTATION;  Surgeon: [name], DPM;  Location: [place];  Service: Podiatry;  Laterality: Right;     reports that he quit smoking about 40 years ago. His smoking use included cigarettes. He has a 29.00 pack-year smoking history. He has never used smokeless tobacco. He reports that he does not drink alcohol or use drugs.  No Known Allergies  Family History  Problem Relation Age of Onset  . Heart attack Father 61  . Stroke Father   . Kidney failure Sister        Bright's disease   . Cancer Paternal Aunt        d. 7s; NOS cancer of leg  . Kidney disease Sister        d. 61; "Bright's disease" - possible kidney cancer?  . Cancer Sister        NOS cancer; d. 27s; "sick in the hospital"  . Heart attack Son 71  . Leukemia Son 79  . COPD Son   . Breast cancer Daughter 58     Prior to Admission medications   Medication Sig Start Date End Date Taking? Authorizing Provider  acetaminophen (TYLENOL) 500 MG tablet Take 1,000 mg by mouth every 6 (six) hours as needed for moderate pain or headache.    [provider]  amLODipine (NORVASC) 5 MG tablet Take 1 tablet (5 mg total) by mouth daily. 08/25/19   Croitoru, Mihai, MD  clopidogrel (PLAVIX) 75 MG tablet Take 75 mg by mouth daily.    [provider]  docusate sodium (COLACE) 100 MG capsule Take 100 mg by mouth daily.     [provider]  finasteride (PROSCAR) 5 MG tablet Take 5 mg by mouth every evening.     [provider]  HYDROcodone-acetaminophen (NORCO/VICODIN) 5-325 MG tablet Take 1 tablet by mouth every 6 (six) hours as needed for moderate pain ((score 4 to 6)). 12/13/18   Geradine Girt, DO  hydroxychloroquine (PLAQUENIL) 200 MG tablet Take 200 mg by mouth daily.    [provider]  Multiple Vitamin  (MULTIVITAMIN WITH MINERALS) TABS tablet Take 1 tablet by mouth every evening.     [provider]  predniSONE (DELTASONE) 5 MG tablet Take 5 mg by mouth daily with breakfast.    [provider]  sertraline (ZOLOFT) 25 MG tablet Take 25 mg by mouth daily.    [provider]    Physical Exam: Vitals:   09/16/19 2114 09/16/19 2200 09/16/19 2243 09/16/19 2300  BP:  Marland Kitchen)  111/99 111/89 (!) 142/85  Pulse:  (!) 33 (!) 108   Resp:  (!) 21 18   Temp: 99.9 F (37.7 C)  99.1 F (37.3 C) 98.6 F (37 C)  TempSrc: Axillary  Oral Oral  SpO2:  96% 100% 100%  Weight:      Height:        Constitutional: NAD, calm, comfortable Eyes: PERRL, lids and conjunctivae normal ENMT: Mucous membranes are moist. Posterior pharynx clear of any exudate or lesions.Normal dentition.  Neck: normal, supple, no masses, no thyromegaly Respiratory: clear to auscultation bilaterally, no wheezing, no crackles. Normal respiratory effort. No accessory muscle use.  Cardiovascular: Regular rate and rhythm, no murmurs / rubs / gallops. No extremity edema. 2+ pedal pulses. No carotid bruits.  Abdomen: no tenderness, no masses palpated. No hepatosplenomegaly. Bowel sounds positive.  Musculoskeletal: no clubbing / cyanosis. No joint deformity upper and lower extremities. Good ROM, no contractures. Normal muscle tone.  Skin: no rashes, lesions, ulcers. No induration Neurologic: MAE Psychiatric: Encephalopathic, altered.   Labs on Admission: I have personally reviewed following labs and imaging studies  CBC: Recent Labs  Lab 09/16/19 1113  WBC 17.6*  NEUTROABS 15.0*  HGB 11.3*  HCT 36.6*  MCV 82.4  PLT 99991111   Basic Metabolic Panel: Recent Labs  Lab 09/16/19 1113  NA 132*  K 4.2  CL 101  CO2 22  GLUCOSE 184*  BUN 15  CREATININE 1.06  CALCIUM 9.2   GFR: Estimated Creatinine Clearance: 56.9 mL/min (by C-G formula based on SCr of 1.06 mg/dL). Liver Function Tests: Recent Labs  Lab  09/16/19 1113  AST 118*  ALT 159*  ALKPHOS 112  BILITOT 1.1  PROT 7.0  ALBUMIN 3.6   Recent Labs  Lab 09/16/19 1113  LIPASE 24   No results for input(s): AMMONIA in the last 168 hours. Coagulation Profile: No results for input(s): INR, PROTIME in the last 168 hours. Cardiac Enzymes: No results for input(s): CKTOTAL, CKMB, CKMBINDEX, TROPONINI in the last 168 hours. BNP (last 3 results) No results for input(s): PROBNP in the last 8760 hours. HbA1C: No results for input(s): HGBA1C in the last 72 hours. CBG: No results for input(s): GLUCAP in the last 168 hours. Lipid Profile: No results for input(s): CHOL, HDL, LDLCALC, TRIG, CHOLHDL, LDLDIRECT in the last 72 hours. Thyroid Function Tests: No results for input(s): TSH, T4TOTAL, FREET4, T3FREE, THYROIDAB in the last 72 hours. Anemia Panel: No results for input(s): VITAMINB12, FOLATE, FERRITIN, TIBC, IRON, RETICCTPCT in the last 72 hours. Urine analysis:    Component Value Date/Time   COLORURINE YELLOW 09/16/2019 1455   APPEARANCEUR CLEAR 09/16/2019 1455   LABSPEC 1.010 09/16/2019 1455   PHURINE 7.0 09/16/2019 1455   GLUCOSEU NEGATIVE 09/16/2019 1455   HGBUR SMALL (A) 09/16/2019 1455   BILIRUBINUR NEGATIVE 09/16/2019 1455   KETONESUR NEGATIVE 09/16/2019 1455   PROTEINUR 100 (A) 09/16/2019 1455   UROBILINOGEN 1.0 11/27/2013 0947   NITRITE NEGATIVE 09/16/2019 1455   LEUKOCYTESUR NEGATIVE 09/16/2019 1455    Radiological Exams on Admission: Ct Abdomen Pelvis W Contrast  Result Date: 09/16/2019 CLINICAL DATA:  Acute abdominal pain, generalized. EXAM: CT ABDOMEN AND PELVIS WITH CONTRAST TECHNIQUE: Multidetector CT imaging of the abdomen and pelvis was performed using the standard protocol following bolus administration of intravenous contrast. CONTRAST:  122mL OMNIPAQUE IOHEXOL 300 MG/ML  SOLN COMPARISON:  10/03/2018 FINDINGS: Lower chest: Small subpleural lymph node in the right lower lobe stable since 2016. Signs of  scarring and mild subpleural  reticulation, unchanged. No signs of consolidation or evidence of pleural effusion. Hepatobiliary: Liver is unremarkable. There is moderate distension of the gallbladder with signs of gallbladder wall thickening and pericholecystic stranding. Wall thickness difficult to read on CT but is much as 4 mm despite the distended appearance of the gallbladder. There is no intra or extrahepatic biliary ductal distension. Pancreas: Unremarkable. No pancreatic ductal dilatation or surrounding inflammatory changes. Spleen: Normal in size without focal abnormality. Adrenals/Urinary Tract: Signs of right nephrectomy with stable small area of soft tissue nodularity along the ligated right renal artery measuring approximately 1.5 cm this has been present since 2007. Adrenal glands are normal. Small presumed cysts in the left kidney are unchanged. Stomach/Bowel: No signs of acute gastrointestinal process. Normal appendix. Vascular/Lymphatic: Calcified atherosclerotic changes of the abdominal aorta up to 3 x 2.7 cm in greatest axial dimension. No signs of adenopathy in the upper abdomen or retroperitoneum. No signs of pelvic adenopathy. Vascular disease extends into branch vessels throughout the abdomen and pelvis. Reproductive: Prostatomegaly. No sign of pelvic adenopathy. Other: No signs of free air. No abscess. Musculoskeletal: Spinal degenerative changes with signs of lower lumbar laminectomy. Grade 1 anterolisthesis of L5 on S1. Finding similar to prior study. Also mild anterolisthesis of L4 on L5. IMPRESSION: 1. CT findings of acute cholecystitis. 2. Signs of right nephrectomy with stable small area of soft tissue nodularity along the ligated right renal artery. Unchanged since 2007. 3. Aortic atherosclerosis. 4. Prostatomegaly. Aortic Atherosclerosis (ICD10-I70.0). Electronically Signed   By: Zetta Bills M.D.   On: 09/16/2019 12:39   US Abdomen Limited  Result Date: 09/16/2019 CLINICAL DATA:   Abdominal pain. CT abdomen and pelvis today demonstrated findings worrisome for cholecystitis. EXAM: ULTRASOUND ABDOMEN LIMITED RIGHT UPPER QUADRANT COMPARISON:  CT abdomen and pelvis earlier today. FINDINGS: Gallbladder: No gallstones or pericholecystic fluid. Sonographer reports negative Murphy's sign. The gallbladder is mildly distended and there is minimal wall thickening at 0.4 cm. Common bile duct: Diameter: 0.8 cm Liver: No focal lesion. Echogenicity is increased. Portal vein is patent on color Doppler imaging with normal direction of blood flow towards the liver. Other: None. IMPRESSION: Negative for gallstones or acute cholecystitis. Fatty infiltration of the liver. Electronically Signed   By: Inge Rise M.D.   On: 09/16/2019 13:59   Dg Chest Portable 1 View  Result Date: 09/16/2019 CLINICAL DATA:  Fever, dyspnea. EXAM: PORTABLE CHEST 1 VIEW COMPARISON:  Chest radiograph 12/12/2018 FINDINGS: The patient is significantly rotated to the right, limiting evaluation. Heart size within normal limits. No definite airspace consolidation within the lungs. No evidence of pleural effusion or pneumothorax. No acute bony abnormality. Overlying cardiac monitoring leads. IMPRESSION: Patient significantly rotated to the right, limiting evaluation. No definite airspace consolidation. Electronically Signed   By: Kellie Simmering DO   On: 09/16/2019 15:16    EKG: Independently reviewed.  Assessment/Plan Principal Problem:   Ascending cholangitis Active Problems:   CAD (coronary artery disease)   Acute metabolic encephalopathy   A-fib (HCC)   Sepsis (HCC)   Transaminitis   Nausea and vomiting   Rheumatoid arthritis ([place])    1. Sepsis, abd pain, N/V, transaminitis, and rapidly progressive encephalopathy - Not jaundiced yet, but otherwise has a presentation highly suspicious for acute ascending cholangitis 1. HIDA scan ordered 2. Repeat CBC/CMP in AM 1. Though if LFTs trend further up and patient  further deteriorates significantly overnight (hypotension develops), may want to just get IR involved for decompression ASAP in AM 3. Empiric zosyn 1.  MRSA PCR nares pending, but no h/o MRSA previously 4. IVF: 2L bolus in ED and 125 cc/hr LR 5. Serial lactic acids 6. Takes chronic steroids for RA: 1. Due to apparent worsening of condition and development of encephalopathy, will start stress dose solucortef 50mg  IV Q8H 2. RA - 1. Hold plaquenil and prednisone 2. Doing stress dose steroids as above 3. H/o A.Fib - 1. In either A.Fib or irregular S.Tach right now 2. EKG to confirm which 3. HR running 100-120s 4. May end up starting cardizem if BP tolerates assuming EKG confirms A.Fib 5. Looks like he takes chronic plavix for CAD / PAD but not on chronic blood thinners otherwise 4. Acute encephalopathy - 1. Likely delirium secondary to #1 above 2. Will get CT head to eval for other causes however 5. CAD/PAD - 1. Chronic and stable 2. Holding plavix for the moment 6. HTN - hold home BP meds  DVT prophylaxis: Lovenox Code Status: Full Family Communication: No family in room Disposition Plan: Home after admit Consults called: EDP spoke with general surgery Admission status: Admit to inpatient  Severity of Illness: The appropriate patient status for this patient is INPATIENT. Inpatient status is judged to be reasonable and necessary in order to provide the required intensity of service to ensure the patient's safety. The patient's presenting symptoms, physical exam findings, and initial radiographic and laboratory data in the context of their chronic comorbidities is felt to place them at high risk for further clinical deterioration. Furthermore, it is not anticipated that the patient will be medically stable for discharge from the hospital within 2 midnights of admission. The following factors support the patient status of inpatient.   IP status due to sepsis with rapidly deteriorating  mental status, now encephalopathic.  * I certify that at the point of admission it is my clinical judgment that the patient will require inpatient hospital care spanning beyond 2 midnights from the point of admission due to high intensity of service, high risk for further deterioration and high frequency of surveillance required.*    GARDNER, JARED M. DO [facility]  How to contact the [facility] Attending or Consulting provider [place] or covering provider during after hours [place], for this patient?  1. Check the care team in [facility] and look for a) attending/consulting [facility] provider listed and b) the [facility] team listed 2. Log into [email or web address]  Amion Physician Scheduling and messaging for groups and whole hospitals  On call and physician scheduling software for group practices, residents, hospitalists and other medical providers for call, clinic, rotation and shift schedules. On[employer] is a hospital-wide system for scheduling doctors and paging doctors on call. EasyPlot is for scientific plotting and data analysis.  [email or web address]  and use [place]'s universal password to access. If you do not have the password, please contact the hospital operator.  3. Locate the [facility] provider you are looking for under [facility] and page to a number that you can be directly reached. 4. If you still have difficulty reaching the provider, please page the [facility] (Director on Call) for the Hospitalists listed on amion for assistance.  09/16/2019, 11:59 PM

## 2019-09-16 NOTE — ED Notes (Signed)
Peri care given. Noted IV infiltrated Right forearm. IV d/c intact.

## 2019-09-16 NOTE — ED Notes (Signed)
Right forearm mild edema from fluid infiltrate. MD informed. Skin intact.

## 2019-09-16 NOTE — ED Provider Notes (Signed)
[facility] EMERGENCY DEPARTMENT Provider Note   CSN: [id number] Arrival date & time: 09/16/19  1052     History   Chief Complaint Chief Complaint  Patient presents with  . Abdominal Pain    HPI Derrick [name] is a 85 y.o. male.     HPI   85 year old male with abdominal pain.  Onset about 3 days ago.  Persistent since then.  Pain is periumbilically to lower abdomen.  Associated with nausea and vomiting.  No change in his bowel movements.  No acute urinary complaints.  No fevers or chills.  Went to his PCP today for evaluation and was referred to the emergency room over concern with his degree of symptoms.    Past Medical History:  Diagnosis Date  . Anemia   . Anxiety   . Arthritis, rheumatoid ([place])   . BPH (benign prostatic hyperplasia)   . CAD (coronary artery disease)   . CKD (chronic kidney disease)   . Depression   . Diverticulosis   . Elevated PSA   . Gangrene ([employer])    of toe right foot  . GERD (gastroesophageal reflux disease)   . Herpes simplex labialis   . High cholesterol   . History of colonic polyps   . Hypertension   . Insomnia   . Lacunar infarction ([place])   . Memory loss   . Obesity   . Panic attacks   . Prostate cancer ([place]) YRS AGO  . Renal cell cancer ([place])    s/p nephrectomy in 2000  . Seropositive rheumatoid arthritis ([place])   . Spermatocele   . Tinnitus   . Tremor of both hands     Patient Active Problem List   Diagnosis Date Noted  . Arm pain 07/07/2019  . New onset a-fib ([place]) 12/12/2018  . Acute blood loss anemia 11/28/2018  . Tachycardia 11/27/2018  . Gross hematuria 11/27/2018  . Acute metabolic encephalopathy [id number]  . Spinal stenosis, lumbar region with neurogenic claudication 11/26/2018  . Ulcer of right foot limited to breakdown of skin ([place])   . PAD (peripheral artery disease) ([place]) 09/23/2017  . Syncope and collapse 09/19/2017  . Osteomyelitis ([place])   . Malaise 09/09/2017  . Anorexia 09/09/2017  .  Hypotension 09/09/2017  . Volume depletion 09/09/2017  . AKI (acute kidney injury) ([place]) 09/09/2017  . Hyponatremia 06/12/2017  . COPD (chronic obstructive pulmonary disease) ([place])   . Lacunar infarction ([place])   . Gangrene of toe of right foot ([place])   . Pre-syncope 11/21/2016  . Dyspnea on exertion 03/03/2016  . H/O unilateral Rt nephrectomy 03/03/2016  . Asymmetrical right sensorineural hearing loss 02/24/2016  . Bilateral hearing loss 02/24/2016  . Bilateral impacted cerumen 02/24/2016  . Arrhythmia 11/09/2015  . CAD (coronary artery disease) 11/09/2015  . Abnormal ECG 10/22/2013  . Dyspnea 10/22/2013  . Chest pain 11/18/2011  . Unstable angina ([place]) 11/18/2011  . Thrombocytopenia ([place]) 11/18/2011  . Anemia 11/18/2011  . CKD (chronic kidney disease), stage III 11/18/2011  . Hypertension   . High cholesterol   . GERD (gastroesophageal reflux disease)   . BPH (benign prostatic hyperplasia)     Past Surgical History:  Procedure Laterality Date  . AMPUTATION TOE Right 06/13/2017   Procedure: AMPUTATION TOE 1st and 2nd;  Surgeon: [name], DPM;  Location: [facility];  Service: Podiatry;  Laterality: Right;  . AMPUTATION TOE     RIGHT FOOT ALL TOES GONE  . BACK SURGERY  YRS AGO  LOWER  . COLONOSCOPY    . GRAFT APPLICATION Right 0000000   Procedure: EPIFIX GRAFT APPLICATION OF RIGHT FOOT;  Surgeon: [name], DPM;  Location: [facility];  Service: Podiatry;  Laterality: Right;  . I&D EXTREMITY Right 09/13/2017   Procedure: IRRIGATION AND DEBRIDEMENT AND BONE BIOPSY;  Surgeon: [name], DPM;  Location: [facility];  Service: Podiatry;  Laterality: Right;  . INCISION AND DRAINAGE OF WOUND Right 12/18/2017   Procedure: IRRIGATION AND DEBRIDEMENT WOUND ON RIGHT FOOT;  Surgeon: [name], DPM;  Location: [facility];  Service: Podiatry;  Laterality: Right;  . IR ANGIOGRAM EXTREMITY RIGHT  05/30/2017  . IR ANGIOGRAM EXTREMITY RIGHT  08/08/2017  . IR ANGIOGRAM SELECTIVE EACH  ADDITIONAL VESSEL  08/08/2017  . IR ANGIOGRAM SELECTIVE EACH ADDITIONAL VESSEL  08/08/2017  . IR RADIOLOGIST EVAL & MGMT  05/16/2017  . IR TIB-PERO ART PTA MOD SED  05/30/2017  . IR TIB-PERO ART PTA MOD SED  08/08/2017  . IR TIB-PERO ART UNI PTA EA ADD VESSEL MOD SED  05/30/2017  . IR TIB-PERO ART UNI PTA EA ADD VESSEL MOD SED  08/08/2017  . IR US GUIDE VASC ACCESS RIGHT  05/30/2017  . IR US GUIDE VASC ACCESS RIGHT  08/08/2017  . LEFT HEART CATHETERIZATION WITH CORONARY ANGIOGRAM N/A 11/19/2011   Procedure: LEFT HEART CATHETERIZATION WITH CORONARY ANGIOGRAM;  Surgeon: [name], MD;  Location: [facility] CATH LAB;  Service: Cardiovascular;  Laterality: N/A;  . LUMBAR LAMINECTOMY/DECOMPRESSION MICRODISCECTOMY N/A 11/26/2018   Procedure: Lumbar central decompression L2-3, L3-4;  Surgeon: [name], MD;  Location: [facility];  Service: Orthopedics;  Laterality: N/A;  180min  . NEPHRECTOMY  10/1998  . TRANSMETATARSAL AMPUTATION Right 08/14/2017   Procedure: TRANSMETATARSAL AMPUTATION;  Surgeon: [name], DPM;  Location: [facility];  Service: Podiatry;  Laterality: Right;        Home Medications    Prior to Admission medications   Medication Sig Start Date End Date Taking? Authorizing Provider  acetaminophen (TYLENOL) 500 MG tablet Take 1,000 mg by mouth every 6 (six) hours as needed for moderate pain or headache.    [provider]  amLODipine (NORVASC) 5 MG tablet Take 1 tablet (5 mg total) by mouth daily. 08/25/19   Croitoru, Mihai, MD  clopidogrel (PLAVIX) 75 MG tablet Take 75 mg by mouth daily.    [provider]  docusate sodium (COLACE) 100 MG capsule Take 100 mg by mouth daily.     [provider]  finasteride (PROSCAR) 5 MG tablet Take 5 mg by mouth every evening.     [provider]  gabapentin (NEURONTIN) 100 MG capsule Take 1 capsule (100 mg total) by mouth at bedtime. Patient not taking: Reported on 07/07/2019 12/13/18   Geradine Girt, DO   HYDROcodone-acetaminophen (NORCO/VICODIN) 5-325 MG tablet Take 1 tablet by mouth every 6 (six) hours as needed for moderate pain ((score 4 to 6)). 12/13/18   Geradine Girt, DO  hydroxychloroquine (PLAQUENIL) 200 MG tablet Take 200 mg by mouth daily.    [provider]  Multiple Vitamin (MULTIVITAMIN WITH MINERALS) TABS tablet Take 1 tablet by mouth every evening.     [provider]  oxyCODONE-acetaminophen (PERCOCET/ROXICET) 5-325 MG tablet Take 1-2 tablets by mouth every 8 (eight) hours as needed for severe pain. Patient not taking: Reported on 07/07/2019 07/04/19   Providence Lanius A, PA-C  predniSONE (DELTASONE) 5 MG tablet Take 5 mg by mouth daily with breakfast.  [provider]  sertraline (ZOLOFT) 25 MG tablet Take 25 mg by mouth daily.    [provider]    Family History Family History  Problem Relation Age of Onset  . Heart attack Father 55  . Stroke Father   . Kidney failure Sister        Bright's disease   . Cancer Paternal Aunt        d. 47s; NOS cancer of leg  . Kidney disease Sister        d. 1; "Bright's disease" - possible kidney cancer?  . Cancer Sister        NOS cancer; d. 53s; "sick in the hospital"  . Heart attack Son 26  . Leukemia Son 38  . COPD Son   . Breast cancer Daughter 72    Social History Social History   Tobacco Use  . Smoking status: Former Smoker    Packs/day: 1.00    Years: 29.00    Pack years: 29.00    Types: Cigarettes    Quit date: 10/08/1978    Years since quitting: 40.9  . Smokeless tobacco: Never Used  . Tobacco comment: tried smokeless tobacco once but made him sick  Substance Use Topics  . Alcohol use: No  . Drug use: No     Allergies   Patient has no known allergies.   Review of Systems Review of Systems  All systems reviewed and negative, other than as noted in HPI.  Physical Exam Updated Vital Signs BP (!) 131/91 (BP Location: Left Arm)   Pulse (!) 109   Temp 98.9 F (37.2  C) (Oral)   Resp 16   Ht 6' (1.829 m)   Wt 80.8 kg   SpO2 94%   BMI 24.15 kg/m   Physical Exam Vitals signs and nursing note reviewed.  Constitutional:      General: He is not in acute distress.    Appearance: He is well-developed.  HENT:     Head: Normocephalic and atraumatic.  Eyes:     General:        Right eye: No discharge.        Left eye: No discharge.     Conjunctiva/sclera: Conjunctivae normal.  Neck:     Musculoskeletal: Neck supple.  Cardiovascular:     Rate and Rhythm: Normal rate and regular rhythm.     Heart sounds: Normal heart sounds. No murmur. No friction rub. No gallop.   Pulmonary:     Effort: Pulmonary effort is normal. No respiratory distress.     Breath sounds: Normal breath sounds.  Abdominal:     General: There is no distension.     Palpations: Abdomen is soft.     Tenderness: There is abdominal tenderness.     Comments: Tenderness to palpation periumbilically and suprapubically without rebound or guarding.  Musculoskeletal:        General: No tenderness.  Skin:    General: Skin is warm and dry.  Neurological:     Mental Status: He is alert.  Psychiatric:        Behavior: Behavior normal.        Thought Content: Thought content normal.      ED Treatments / Results  Labs (all labs ordered are listed, but only abnormal results are displayed) Labs Reviewed  CBC WITH DIFFERENTIAL/PLATELET - Abnormal; Notable for the following components:      Result Value   WBC 17.6 (*)    Hemoglobin 11.3 (*)  HCT 36.6 (*)    MCH 25.5 (*)    RDW 15.7 (*)    Neutro Abs 15.0 (*)    Monocytes Absolute 1.5 (*)    Abs Immature Granulocytes 0.11 (*)    All other components within normal limits  COMPREHENSIVE METABOLIC PANEL - Abnormal; Notable for the following components:   Sodium 132 (*)    Glucose, Bld 184 (*)    AST 118 (*)    ALT 159 (*)    All other components within normal limits  URINALYSIS, ROUTINE W REFLEX MICROSCOPIC - Abnormal; Notable  for the following components:   Hgb urine dipstick SMALL (*)    Protein, ur 100 (*)    All other components within normal limits  LACTIC ACID, PLASMA - Abnormal; Notable for the following components:   Lactic Acid, Venous 2.9 (*)    All other components within normal limits  URINALYSIS, MICROSCOPIC (REFLEX) - Abnormal; Notable for the following components:   Bacteria, UA RARE (*)    All other components within normal limits  CULTURE, BLOOD (ROUTINE X 2)  CULTURE, BLOOD (ROUTINE X 2)  SARS CORONAVIRUS 2 (TAT 6-24 HRS)  LIPASE, BLOOD  LACTIC ACID, PLASMA    EKG EKG Interpretation  Date/Time:  Wednesday September 16 2019 12:28:56 EST Ventricular Rate:  113 PR Interval:    QRS Duration: 109 QT Interval:  354 QTC Calculation: 479 R Axis:   29 Text Interpretation: Sinus tachycardia Abnormal R-wave progression, early transition Borderline repolarization abnormality Confirmed by Virgel Manifold [phone]) on 09/16/2019 12:35:41 PM   Radiology Ct Abdomen Pelvis W Contrast  Result Date: 09/16/2019 CLINICAL DATA:  Acute abdominal pain, generalized. EXAM: CT ABDOMEN AND PELVIS WITH CONTRAST TECHNIQUE: Multidetector CT imaging of the abdomen and pelvis was performed using the standard protocol following bolus administration of intravenous contrast. CONTRAST:  150mL OMNIPAQUE IOHEXOL 300 MG/ML  SOLN COMPARISON:  10/03/2018 FINDINGS: Lower chest: Small subpleural lymph node in the right lower lobe stable since 2016. Signs of scarring and mild subpleural reticulation, unchanged. No signs of consolidation or evidence of pleural effusion. Hepatobiliary: Liver is unremarkable. There is moderate distension of the gallbladder with signs of gallbladder wall thickening and pericholecystic stranding. Wall thickness difficult to read on CT but is much as 4 mm despite the distended appearance of the gallbladder. There is no intra or extrahepatic biliary ductal distension. Pancreas: Unremarkable. No pancreatic  ductal dilatation or surrounding inflammatory changes. Spleen: Normal in size without focal abnormality. Adrenals/Urinary Tract: Signs of right nephrectomy with stable small area of soft tissue nodularity along the ligated right renal artery measuring approximately 1.5 cm this has been present since 2007. Adrenal glands are normal. Small presumed cysts in the left kidney are unchanged. Stomach/Bowel: No signs of acute gastrointestinal process. Normal appendix. Vascular/Lymphatic: Calcified atherosclerotic changes of the abdominal aorta up to 3 x 2.7 cm in greatest axial dimension. No signs of adenopathy in the upper abdomen or retroperitoneum. No signs of pelvic adenopathy. Vascular disease extends into branch vessels throughout the abdomen and pelvis. Reproductive: Prostatomegaly. No sign of pelvic adenopathy. Other: No signs of free air. No abscess. Musculoskeletal: Spinal degenerative changes with signs of lower lumbar laminectomy. Grade 1 anterolisthesis of L5 on S1. Finding similar to prior study. Also mild anterolisthesis of L4 on L5. IMPRESSION: 1. CT findings of acute cholecystitis. 2. Signs of right nephrectomy with stable small area of soft tissue nodularity along the ligated right renal artery. Unchanged since 2007. 3. Aortic atherosclerosis. 4. Prostatomegaly. Aortic Atherosclerosis (  ICD10-I70.0). Electronically Signed   By: Zetta Bills M.D.   On: 09/16/2019 12:39   US Abdomen Limited  Result Date: 09/16/2019 CLINICAL DATA:  Abdominal pain. CT abdomen and pelvis today demonstrated findings worrisome for cholecystitis. EXAM: ULTRASOUND ABDOMEN LIMITED RIGHT UPPER QUADRANT COMPARISON:  CT abdomen and pelvis earlier today. FINDINGS: Gallbladder: No gallstones or pericholecystic fluid. Sonographer reports negative Murphy's sign. The gallbladder is mildly distended and there is minimal wall thickening at 0.4 cm. Common bile duct: Diameter: 0.8 cm Liver: No focal lesion. Echogenicity is increased.  Portal vein is patent on color Doppler imaging with normal direction of blood flow towards the liver. Other: None. IMPRESSION: Negative for gallstones or acute cholecystitis. Fatty infiltration of the liver. Electronically Signed   By: Inge Rise M.D.   On: 09/16/2019 13:59   Dg Chest Portable 1 View  Result Date: 09/16/2019 CLINICAL DATA:  Fever, dyspnea. EXAM: PORTABLE CHEST 1 VIEW COMPARISON:  Chest radiograph 12/12/2018 FINDINGS: The patient is significantly rotated to the right, limiting evaluation. Heart size within normal limits. No definite airspace consolidation within the lungs. No evidence of pleural effusion or pneumothorax. No acute bony abnormality. Overlying cardiac monitoring leads. IMPRESSION: Patient significantly rotated to the right, limiting evaluation. No definite airspace consolidation. Electronically Signed   By: Kellie Simmering DO   On: 09/16/2019 15:16    Procedures Procedures (including critical care time)  Medications Ordered in ED Medications  lactated ringers infusion ( Intravenous New Bag/Given 09/16/19 1125)  morphine 4 MG/ML injection 4 mg (4 mg Intravenous Given 09/16/19 1121)  ondansetron (ZOFRAN) injection 4 mg (4 mg Intravenous Given 09/16/19 1121)     Initial Impression / Assessment and Plan / ED Course  I have reviewed the triage vital signs and the nursing notes.  Pertinent labs & imaging results that were available during my care of the patient were reviewed by me and considered in my medical decision making (see chart for details).   86 year old male with lower abdominal pain.  Suspect he may have diverticulitis? UTI? Tenderness on exam but no peritonitis.  Plan  labs, UA, symptomatic treatment and CT of the abdomen and pelvis.  CT suggestive of cholecystitis. Received Zosyn. Subsequent [place] is not though. Discussed with surgery. Recommending HIDA scan and formal consultation if positive. Otherwise medicine admit.   Called by nursing. Pt now very  agitated/combative. Febrile on re-check. Doesn't surprise me as he was having rigors shortly before this. Had to be given some sedation to obtain another IV and straight cath him. Suspect he may have a UTI. Previously ordered zosyn should cover.   Doesn't appear to have UTI. Regardless, he is febrile/altered/abdominal pain and needs admitted.   Final Clinical Impressions(s) / ED Diagnoses   Final diagnoses:  Abdominal pain, unspecified abdominal location  Fever, unspecified fever cause  Delirium    ED Discharge Orders    None       Virgel Manifold, MD 09/16/19 1526

## 2019-09-16 NOTE — ED Notes (Signed)
Check for incontinence. PT dry. Sleeping.

## 2019-09-16 NOTE — ED Notes (Signed)
Patient transported to CT 

## 2019-09-16 NOTE — ED Notes (Signed)
Pt returned from CT shaking. CT staff states pt seemed to be more altered than when he went over. EDP made aware

## 2019-09-16 NOTE — Discharge Instructions (Addendum)
Cholecystostomy, Care After This sheet gives you information about how to care for yourself after your procedure. Your health care provider may also give you more specific instructions. If you have problems or questions, contact your health care provider. What can I expect after the procedure? After your procedure, it is common to have soreness near the incision site of your drainage tube (catheter). Follow these instructions at home: Incision care   Follow instructions from your health care provider about how to take care of your incision site where the catheter was inserted. Make sure you: ? Wash your hands with soap and water before and after you change your bandage (dressing). If soap and water are not available, use hand sanitizer. ? Change your dressing as told by your health care provider.  Check the incision site every day for signs of infection. Check for: ? Redness, swelling, or pain. ? Fluid or blood. ? Warmth. ? Pus or a bad smell.  Do not take baths, swim, or use a hot tub until your health care provider approves. Ask your health care provider if you may take showers. You may only be allowed to take sponge baths. General instructions  Follow instructions from your health care provider about how to care for your catheter and collection bag at home.  Your health care provider will show you: ? How to record the amount of drainage from the catheter. ? How to flush the catheter. ? How to care for the catheter incision site.  Follow instructions from your health care provider about eating or drinking restrictions.  Take over-the-counter and prescription medicines only as told by your health care provider.  Keep all follow-up visits as told by your health care provider. This is important. Contact a health care provider if:  You have redness, swelling, or pain around the catheter incision site.  You have nausea or vomiting. Get help right away if:  Your abdominal pain  gets worse.  You feel dizzy or you faint while standing.  You have fluid or blood coming from the catheter incision site.  The area around the catheter incision site feels warm to the touch.  You have pus or a bad smell coming from the catheter incision site.  You have a fever.  You have shortness of breath.  You have a rapid heartbeat.  Your nausea or vomiting does not go away.  Your catheter becomes blocked.  Your catheter comes out of your abdomen. Summary  After your procedure, it is common to have soreness near the incision site of your drainage tube (catheter).  Wash your hands with soap and water before and after you change your bandage (dressing). Change your dressing as told by your health care provider.  Check the catheter incision site every day for signs of infection. Check for redness, swelling, pain, fluid, blood, warmth, pus, or a bad smell.  Contact your health care provider if you have nausea or vomiting, or if you have redness, swelling, or pain around your catheter incision site.  Get help right away if your abdominal pain gets worse, you feel dizzy, you have blood or fluid coming from the catheter incision site, you have a fever, or you have shortness of breath. This information is not intended to replace advice given to you by your health care provider. Make sure you discuss any questions you have with your health care provider. Document Released: 06/15/2015 Document Revised: 04/21/2018 Document Reviewed: 04/21/2018 Elsevier Patient Education  [place].

## 2019-09-16 NOTE — Progress Notes (Signed)
Was called by ED physician, Dr. Wilson Singer about patient presented to [facility] with a 3-day history of periumbilical to lower abdominal pain with nausea and vomiting with no change in bowel movements.  Patient presented to PCP and referred to the ED.  Patient seen in the ED patient noted to have a fever with a temperature of 101.6, tachycardic up to 134, blood pressure 131/91 with sats of 90% on room air.  COVID-19 pending.  The ED physician patient asymptomatic with low suspicion for COVID-19.  Abdominal ultrasound which was done negative for acute cholecystitis however CT abdomen and pelvis consistent with acute cholecystitis.  ED physician spoke with general surgery PA who recommended admission to the medical service with recommendations of getting a HIDA scan and if HIDA scan was positive then to get a formal general surgical consultation.  Blood cultures obtained.  Patient given a dose of IV Zosyn in the ED as well as IV fluids.  Hospitalist consulted for admission.  Patient accepted to stepdown bed at [facility].  No charge.

## 2019-09-17 ENCOUNTER — Inpatient Hospital Stay (HOSPITAL_COMMUNITY): Payer: Medicare Other

## 2019-09-17 ENCOUNTER — Encounter (HOSPITAL_COMMUNITY): Payer: Self-pay | Admitting: Internal Medicine

## 2019-09-17 DIAGNOSIS — R6520 Severe sepsis without septic shock: Secondary | ICD-10-CM

## 2019-09-17 DIAGNOSIS — R7881 Bacteremia: Secondary | ICD-10-CM

## 2019-09-17 DIAGNOSIS — I10 Essential (primary) hypertension: Secondary | ICD-10-CM

## 2019-09-17 DIAGNOSIS — G9340 Encephalopathy, unspecified: Secondary | ICD-10-CM

## 2019-09-17 DIAGNOSIS — M069 Rheumatoid arthritis, unspecified: Secondary | ICD-10-CM

## 2019-09-17 DIAGNOSIS — B9620 Unspecified Escherichia coli [E. coli] as the cause of diseases classified elsewhere: Secondary | ICD-10-CM

## 2019-09-17 DIAGNOSIS — G9341 Metabolic encephalopathy: Secondary | ICD-10-CM

## 2019-09-17 DIAGNOSIS — K8309 Other cholangitis: Secondary | ICD-10-CM

## 2019-09-17 LAB — BLOOD CULTURE ID PANEL (REFLEXED)

## 2019-09-17 LAB — LACTIC ACID, PLASMA
Lactic Acid, Venous: 1.5 mmol/L (ref 0.5–1.9)
Lactic Acid, Venous: 1.4 mmol/L (ref 0.5–1.9)

## 2019-09-17 LAB — COMPREHENSIVE METABOLIC PANEL
ALT: 167 U/L — ABNORMAL HIGH (ref 0–44)
AST: 111 U/L — ABNORMAL HIGH (ref 15–41)
Albumin: 3.1 g/dL — ABNORMAL LOW (ref 3.5–5.0)
Alkaline Phosphatase: 133 U/L — ABNORMAL HIGH (ref 38–126)
Anion gap: 11 (ref 5–15)
BUN: 17 mg/dL (ref 8–23)
CO2: 21 mmol/L — ABNORMAL LOW (ref 22–32)
Calcium: 9 mg/dL (ref 8.9–10.3)
Chloride: 103 mmol/L (ref 98–111)
Creatinine, Ser: 1.06 mg/dL (ref 0.61–1.24)
GFR calc Af Amer: >60 mL/min (ref >60)
GFR calc non Af Amer: >60 mL/min (ref >60)
Glucose, Bld: 115 mg/dL — ABNORMAL HIGH (ref 70–99)
Potassium: 4.5 mmol/L (ref 3.5–5.1)
Sodium: 135 mmol/L (ref 135–145)
Total Bilirubin: 1.5 mg/dL — ABNORMAL HIGH (ref 0.3–1.2)
Total Protein: 6.6 g/dL (ref 6.5–8.1)

## 2019-09-17 LAB — CBC
HCT: 35.1 % — ABNORMAL LOW (ref 39.0–52.0)
Hemoglobin: 10.6 g/dL — ABNORMAL LOW (ref 13.0–17.0)
MCH: 26.2 pg (ref 26.0–34.0)
MCHC: 30.2 g/dL (ref 30.0–36.0)
MCV: 86.9 fL (ref 80.0–100.0)
Platelets: 219 10*3/uL (ref 150–400)
RBC: 4.04 MIL/uL — ABNORMAL LOW (ref 4.22–5.81)
RDW: 15.9 % — ABNORMAL HIGH (ref 11.5–15.5)
WBC: 20.2 10*3/uL — ABNORMAL HIGH (ref 4.0–10.5)
nRBC: 0 % (ref 0.0–0.2)

## 2019-09-17 LAB — HEPATIC FUNCTION PANEL
ALT: 166 U/L — ABNORMAL HIGH (ref 0–44)
AST: 112 U/L — ABNORMAL HIGH (ref 15–41)
Albumin: 3.1 g/dL — ABNORMAL LOW (ref 3.5–5.0)
Alkaline Phosphatase: 132 U/L — ABNORMAL HIGH (ref 38–126)
Bilirubin, Direct: 0.8 mg/dL — ABNORMAL HIGH (ref 0.0–0.2)
Indirect Bilirubin: 0.6 mg/dL (ref 0.3–0.9)
Total Bilirubin: 1.4 mg/dL — ABNORMAL HIGH (ref 0.3–1.2)
Total Protein: 6.6 g/dL (ref 6.5–8.1)

## 2019-09-17 LAB — MRSA PCR SCREENING: MRSA by PCR: NEGATIVE

## 2019-09-17 LAB — HEMOGLOBIN A1C
Hgb A1c MFr Bld: 6.1 % — ABNORMAL HIGH (ref 4.8–5.6)
Mean Plasma Glucose: 128.37 mg/dL

## 2019-09-17 LAB — GLUCOSE, CAPILLARY
Glucose-Capillary: 116 mg/dL — ABNORMAL HIGH (ref 70–99)
Glucose-Capillary: 110 mg/dL — ABNORMAL HIGH (ref 70–99)
Glucose-Capillary: 115 mg/dL — ABNORMAL HIGH (ref 70–99)
Glucose-Capillary: 118 mg/dL — ABNORMAL HIGH (ref 70–99)

## 2019-09-17 MED ORDER — LORAZEPAM 2 MG/ML IJ SOLN
INTRAMUSCULAR | Status: AC
  Filled 2019-09-17: qty 1

## 2019-09-17 MED ORDER — SODIUM CHLORIDE 0.9 % IV SOLN: INTRAVENOUS | Status: DC

## 2019-09-17 MED ORDER — MORPHINE SULFATE (PF) 4 MG/ML IV SOLN: 3.0000 mg | Freq: Once | INTRAVENOUS | Status: AC

## 2019-09-17 MED ORDER — INSULIN ASPART 100 UNIT/ML ~~LOC~~ SOLN
0.0000 [IU] | SUBCUTANEOUS | Status: DC
  Administered 2019-09-17 – 2019-09-18 (×2): 1 [IU] via SUBCUTANEOUS
  Administered 2019-09-18: 2 [IU] via SUBCUTANEOUS
  Administered 2019-09-18 – 2019-09-22 (×12): 1 [IU] via SUBCUTANEOUS
  Administered 2019-09-22: 2 [IU] via SUBCUTANEOUS
  Administered 2019-09-23 (×2): 1 [IU] via SUBCUTANEOUS
  Administered 2019-09-24: 2 [IU] via SUBCUTANEOUS
  Administered 2019-09-25 (×2): 1 [IU] via SUBCUTANEOUS

## 2019-09-17 MED ORDER — TECHNETIUM TC 99M MEBROFENIN IV KIT
5.3500 | PACK | Freq: Once | INTRAVENOUS | Status: AC
  Administered 2019-09-17: 5.35 via INTRAVENOUS

## 2019-09-17 MED ORDER — MORPHINE SULFATE (PF) 2 MG/ML IV SOLN
2.0000 mg | Freq: Once | INTRAVENOUS | Status: AC
  Administered 2019-09-17: 2 mg via INTRAVENOUS
  Filled 2019-09-17: qty 1

## 2019-09-17 MED ORDER — ORAL CARE MOUTH RINSE
15.0000 mL | Freq: Two times a day (BID) | OROMUCOSAL | Status: DC
  Administered 2019-09-17 – 2019-09-28 (×23): 15 mL via OROMUCOSAL

## 2019-09-17 MED ORDER — MORPHINE SULFATE (PF) 2 MG/ML IV SOLN: 2.0000 mg | Freq: Once | INTRAVENOUS | Status: DC

## 2019-09-17 MED ORDER — CHLORHEXIDINE GLUCONATE CLOTH 2 % EX PADS
6.0000 | MEDICATED_PAD | Freq: Every day | CUTANEOUS | Status: DC
  Administered 2019-09-17 – 2019-09-28 (×9): 6 via TOPICAL

## 2019-09-17 MED ORDER — LORAZEPAM 2 MG/ML IJ SOLN
1.0000 mg | Freq: Once | INTRAMUSCULAR | Status: AC
  Administered 2019-09-17: 1 mg via INTRAVENOUS

## 2019-09-17 MED ORDER — LORAZEPAM 2 MG/ML IJ SOLN: 0.5000 mg | Freq: Once | INTRAMUSCULAR | Status: DC

## 2019-09-17 MED ORDER — MORPHINE SULFATE (PF) 4 MG/ML IV SOLN
INTRAVENOUS | Status: AC
  Administered 2019-09-17: 3 mg via INTRAVENOUS
  Filled 2019-09-17: qty 1

## 2019-09-17 MED ORDER — LORAZEPAM 2 MG/ML IJ SOLN
1.0000 mg | Freq: Once | INTRAMUSCULAR | Status: AC
  Administered 2019-09-17: 1 mg via INTRAVENOUS
  Filled 2019-09-17: qty 1

## 2019-09-17 MED ORDER — HALOPERIDOL LACTATE 5 MG/ML IJ SOLN
2.0000 mg | Freq: Once | INTRAMUSCULAR | Status: AC
  Administered 2019-09-17: 2 mg via INTRAMUSCULAR
  Filled 2019-09-17: qty 1

## 2019-09-17 NOTE — Progress Notes (Signed)
Called in regards to consult but patient has seen [facility] in the past, Dr. Paulita Fujita. I have called and let [facility] know. They will see the patient later today.  Ellouise Newer, PA-C [facility]

## 2019-09-17 NOTE — Progress Notes (Signed)
Initial Nutrition Assessment   DOCUMENTATION CODES:   Not applicable  INTERVENTION:  - diet advancement as medically feasible.    NUTRITION DIAGNOSIS:   Inadequate oral intake related to inability to eat as evidenced by NPO status.  GOAL:   Patient will meet greater than or equal to 90% of their needs  MONITOR:   Diet advancement, Labs, Weight trends  REASON FOR ASSESSMENT:   Malnutrition Screening Tool  ASSESSMENT:   85 y.o. male with medical history significant of RA on prednisone and hydroxychloroquine, CAD, PAD, and HTN. He presented to the ED from PCP's office with complaint of periumbilical and lower abdominal pain with associated N/V that began 3 days PTA. In the ED, he was found to be septic and was given 2L IV fluids. LFTs were noted to be significantly elevated.  Patient has been NPO since admission. Flow sheet documentation indicates that patient is a/o to self only. Patient laying in bed with no family/visitors at bedside. RN and tech preparing to provide patient care. Able to confirm with RN that patient remains NPO and she confirms that he has remained very confused this AM.   Per chart review, current weight is 178 lb and weight on 9/29 was 184 lb. This indicates 6 lb weight loss (3.2% body weight) in the past 2.5 months; not significant for time frame. Weight on 5/1 was 164 lb.   Per notes: - sepsis 2/2 probable ascending cholangitis/E. Coli bacteremia - abd ultrasound in the ED was negative for acute cholecystitis but CT abd/pelvis worrisome for acute cholecystitis  - pending HIDA and possible need for ERCP with decompression - hx of RA - hx of afib--currently controlled  - acute metabolic encephalopathy    Labs reviewed; CBGs: 116, 110, and 115 mg/dl, Alk Phos and LFTs elevated. Medications reviewed; 50 mg solu-cortef TID, sliding scale novolog.  IVF; LR @ 125 ml/hr.     NUTRITION - FOCUSED PHYSICAL EXAM:  unable to complete at this time.   Diet  Order:   Diet Order            Diet NPO time specified Except for: Sips with Meds  Diet effective now              EDUCATION NEEDS:   No education needs have been identified at this time  Skin:  Skin Assessment: Reviewed RN Assessment  Last BM:  PTA/unknown  Height:   Ht Readings from Last 1 Encounters:  09/16/19 6' (1.829 m)    Weight:   Wt Readings from Last 1 Encounters:  09/16/19 80.8 kg    Ideal Body Weight:  80.9 kg  BMI:  Body mass index is 24.15 kg/m.  Estimated Nutritional Needs:   Kcal:  2020-2260 kcal  Protein:  100-115 grams  Fluid:  >/= 2 L/day      Jarome Matin, MS, RD, LDN, [facility] Inpatient Clinical Dietitian Pager # [phone] After hours/weekend pager # [phone]

## 2019-09-17 NOTE — Consult Note (Signed)
Derrick [name] 1932-03-18  [id number].    Requesting MD: Dr. Irine Seal Chief Complaint/Reason for Consult: acute cholecystitis  HPI:  This is an 85 yo white male with a history of atrial fibrillation, chronic kidney disease, coronary artery disease on Plavix, peripheral artery disease, renal cell carcinoma status post nephrectomy, prostate cancer, prior CVA who is currently encephalopathic and unable to provide any history at all.  According to the chart he developed some abdominal pain along with some nausea and vomiting.  There is really limited history in the chart.  He was brought to the hospital and found to be septic with some abdominal pain.  His LFTs have been mildly elevated.  He underwent a CT scan which was concerning for cholecystitis but then an abdominal ultrasound which appeared normal.  He then underwent a HIDA scan today which revealed a cystic duct obstruction consistent with cholecystitis.  We have been asked to evaluate the patient for further surgical recommendations.  ROS: ROS: Unable to obtain secondary to encephalopathy.  Family History  Problem Relation Age of Onset  . Heart attack Father 32  . Stroke Father   . Kidney failure Sister        Bright's disease   . Cancer Paternal Aunt        d. 57s; NOS cancer of leg  . Kidney disease Sister        d. 49; "Bright's disease" - possible kidney cancer?  . Cancer Sister        NOS cancer; d. 73s; "sick in the hospital"  . Heart attack Son 32  . Leukemia Son 19  . COPD Son   . Breast cancer Daughter 29    Past Medical History:  Diagnosis Date  . Anemia   . Anxiety   . Arthritis, rheumatoid ([place])   . BPH (benign prostatic hyperplasia)   . CAD (coronary artery disease)   . CKD (chronic kidney disease)   . Depression   . Diverticulosis   . Elevated PSA   . Gangrene ([place])    of toe right foot  . GERD (gastroesophageal reflux disease)   . Herpes simplex labialis   . High cholesterol   . History  of colonic polyps   . Hypertension   . Insomnia   . Lacunar infarction ([place])   . Memory loss   . Obesity   . Panic attacks   . Prostate cancer ([place]) YRS AGO  . Renal cell cancer ([place])    s/p nephrectomy in 2000  . Seropositive rheumatoid arthritis ([place])   . Spermatocele   . Tinnitus   . Tremor of both hands     Past Surgical History:  Procedure Laterality Date  . AMPUTATION TOE Right 06/13/2017   Procedure: AMPUTATION TOE 1st and 2nd;  Surgeon: [name], DPM;  Location: [facility];  Service: Podiatry;  Laterality: Right;  . AMPUTATION TOE     RIGHT FOOT ALL TOES GONE  . BACK SURGERY  YRS AGO   LOWER  . COLONOSCOPY    . GRAFT APPLICATION Right 0000000   Procedure: EPIFIX GRAFT APPLICATION OF RIGHT FOOT;  Surgeon: [name], DPM;  Location: [facility];  Service: Podiatry;  Laterality: Right;  . I&D EXTREMITY Right 09/13/2017   Procedure: IRRIGATION AND DEBRIDEMENT AND BONE BIOPSY;  Surgeon: [name], DPM;  Location: [facility];  Service: Podiatry;  Laterality: Right;  . INCISION AND DRAINAGE OF WOUND Right 12/18/2017   Procedure: IRRIGATION  AND DEBRIDEMENT WOUND ON RIGHT FOOT;  Surgeon: [name], DPM;  Location: [facility];  Service: Podiatry;  Laterality: Right;  . IR ANGIOGRAM EXTREMITY RIGHT  05/30/2017  . IR ANGIOGRAM EXTREMITY RIGHT  08/08/2017  . IR ANGIOGRAM SELECTIVE EACH ADDITIONAL VESSEL  08/08/2017  . IR ANGIOGRAM SELECTIVE EACH ADDITIONAL VESSEL  08/08/2017  . IR RADIOLOGIST EVAL & MGMT  05/16/2017  . IR TIB-PERO ART PTA MOD SED  05/30/2017  . IR TIB-PERO ART PTA MOD SED  08/08/2017  . IR TIB-PERO ART UNI PTA EA ADD VESSEL MOD SED  05/30/2017  . IR TIB-PERO ART UNI PTA EA ADD VESSEL MOD SED  08/08/2017  . IR US GUIDE VASC ACCESS RIGHT  05/30/2017  . IR US GUIDE VASC ACCESS RIGHT  08/08/2017  . LEFT HEART CATHETERIZATION WITH CORONARY ANGIOGRAM N/A 11/19/2011   Procedure: LEFT HEART CATHETERIZATION WITH CORONARY ANGIOGRAM;  Surgeon: [name], MD;  Location: [facility]  CATH LAB;  Service: Cardiovascular;  Laterality: N/A;  . LUMBAR LAMINECTOMY/DECOMPRESSION MICRODISCECTOMY N/A 11/26/2018   Procedure: Lumbar central decompression L2-3, L3-4;  Surgeon: [name], MD;  Location: [facility];  Service: Orthopedics;  Laterality: N/A;  132min  . NEPHRECTOMY  10/1998  . TRANSMETATARSAL AMPUTATION Right 08/14/2017   Procedure: TRANSMETATARSAL AMPUTATION;  Surgeon: [name], DPM;  Location: [facility];  Service: Podiatry;  Laterality: Right;    Social History:  reports that he quit smoking about 40 years ago. His smoking use included cigarettes. He has a 29.00 pack-year smoking history. He has never used smokeless tobacco. He reports that he does not drink alcohol or use drugs.  Allergies:  Allergies  Allergen Reactions  . Effexor [Venlafaxine] Other (See Comments)    Sexual side affects  . Lisinopril Cough  . Paxil [Paroxetine] Other (See Comments)    Sexual side affects  . Viagra [Sildenafil] Other (See Comments)    Chest discomfort  . Wellbutrin [Bupropion] Itching    Medications Prior to Admission  Medication Sig Dispense Refill  . acetaminophen (TYLENOL) 500 MG tablet Take 1,000 mg by mouth every 6 (six) hours as needed for moderate pain or headache.    Marland Kitchen amLODipine (NORVASC) 5 MG tablet Take 1 tablet (5 mg total) by mouth daily. 30 tablet 4  . aspirin 81 MG chewable tablet Chew 81 mg by mouth daily.    Marland Kitchen atorvastatin (LIPITOR) 80 MG tablet Take 40 mg by mouth daily.    . clopidogrel (PLAVIX) 75 MG tablet Take 75 mg by mouth daily.    Marland Kitchen docusate sodium (COLACE) 250 MG capsule Take 250 mg by mouth daily.    . finasteride (PROSCAR) 5 MG tablet Take 5 mg by mouth every evening.     . hydroxychloroquine (PLAQUENIL) 200 MG tablet Take 200 mg by mouth daily.    . Multiple Vitamin (MULTIVITAMIN WITH MINERALS) TABS tablet Take 1 tablet by mouth every evening.     . nitroGLYCERIN (NITRODUR - DOSED IN MG/24 HR) 0.1 mg/hr patch Place 0.1 mg onto the skin  daily.    . nitroGLYCERIN (NITROSTAT) 0.3 MG SL tablet Place 0.3 mg under the tongue every 5 (five) minutes as needed for chest pain.    . Omega-3 Fatty Acids (FISH OIL) 1000 MG CAPS Take 1,000 mg by mouth daily.    . pantoprazole (PROTONIX) 40 MG tablet Take 40 mg by mouth 2 (two) times daily.    . polyethylene glycol (MIRALAX / GLYCOLAX) 17 g packet Take 17 g by mouth  daily as needed for mild constipation.    . predniSONE (DELTASONE) 5 MG tablet Take 5 mg by mouth daily with breakfast.    . sertraline (ZOLOFT) 25 MG tablet Take 25 mg by mouth daily.    . traMADol (ULTRAM) 50 MG tablet Take 50-100 mg by mouth every 6 (six) hours as needed for moderate pain.    . traZODone (DESYREL) 50 MG tablet Take 50-100 mg by mouth at bedtime as needed for sleep.    Marland Kitchen HYDROcodone-acetaminophen (NORCO/VICODIN) 5-325 MG tablet Take 1 tablet by mouth every 6 (six) hours as needed for moderate pain ((score 4 to 6)). (Patient not taking: Reported on 09/17/2019) 10 tablet 0     Physical Exam: Blood pressure (!) 136/109, pulse 97, temperature 99.1 F (37.3 C), temperature source Axillary, resp. rate (!) 22, height 6' (1.829 m), weight 80.8 kg, SpO2 100 %. General: pleasant, weak appearing white male who is laying in bed in NAD HEENT: head is normocephalic, atraumatic.  Sclera are noninjected.  PERRL.  Ears and nose without any masses or lesions.  Mouth is pink and dry Heart: Irregular.  Normal s1,s2. No obvious murmurs, gallops, or rubs noted.  Palpable radial and pedal pulses bilaterally Lungs: CTAB, no wheezes, rhonchi, or rales noted.  Respiratory effort nonlabored Abd: soft, tender in the right upper quadrant where expected, ND, +BS, no masses, hernias, or organomegaly, scar present on the right side of the abdomen likely from prior nephrectomy MS: all 4 extremities are symmetrical with no cyanosis, clubbing, or edema.  His right foot is missing all of its digits, status post amputations Skin: warm and dry  with no masses, lesions, or rashes Psych: Awakens and opens eyes when his name is called.  The patient is oriented to himself, but not to time, place, or situation   Results for orders placed or performed during the hospital encounter of 09/16/19 (from the past 48 hour(s))  CBC with Differential     Status: Abnormal   Collection Time: 09/16/19 11:13 AM  Result Value Ref Range   WBC 17.6 (H) 4.0 - 10.5 K/uL   RBC 4.44 4.22 - 5.81 MIL/uL   Hemoglobin 11.3 (L) 13.0 - 17.0 g/dL   HCT 36.6 (L) 39.0 - 52.0 %   MCV 82.4 80.0 - 100.0 fL   MCH 25.5 (L) 26.0 - 34.0 pg   MCHC 30.9 30.0 - 36.0 g/dL   RDW 15.7 (H) 11.5 - 15.5 %   Platelets 277 150 - 400 K/uL   nRBC 0.0 0.0 - 0.2 %   Neutrophils Relative % 85 %   Neutro Abs 15.0 (H) 1.7 - 7.7 K/uL   Lymphocytes Relative 5 %   Lymphs Abs 0.9 0.7 - 4.0 K/uL   Monocytes Relative 9 %   Monocytes Absolute 1.5 (H) 0.1 - 1.0 K/uL   Eosinophils Relative 0 %   Eosinophils Absolute 0.0 0.0 - 0.5 K/uL   Basophils Relative 0 %   Basophils Absolute 0.0 0.0 - 0.1 K/uL   Immature Granulocytes 1 %   Abs Immature Granulocytes 0.11 (H) 0.00 - 0.07 K/uL    Comment: Performed at [facility], [address] [place], [place] [address]  Comprehensive metabolic panel     Status: Abnormal   Collection Time: 09/16/19 11:13 AM  Result Value Ref Range   Sodium 132 (L) 135 - 145 mmol/L   Potassium 4.2 3.5 - 5.1 mmol/L   Chloride 101 98 - 111 mmol/L   CO2  22 22 - 32 mmol/L   Glucose, Bld 184 (H) 70 - 99 mg/dL   BUN 15 8 - 23 mg/dL   Creatinine, Ser 1.06 0.61 - 1.24 mg/dL   Calcium 9.2 8.9 - 10.3 mg/dL   Total Protein 7.0 6.5 - 8.1 g/dL   Albumin 3.6 3.5 - 5.0 g/dL   AST 118 (H) 15 - 41 U/L   ALT 159 (H) 0 - 44 U/L   Alkaline Phosphatase 112 38 - 126 U/L   Total Bilirubin 1.1 0.3 - 1.2 mg/dL   GFR calc non Af Amer >60 >60 mL/min   GFR calc Af Amer >60 >60 mL/min   Anion gap 9 5 - 15    Comment: Performed at [facility], [address] [place], [place] [address]  Lipase, blood     Status: None   Collection Time: 09/16/19 11:13 AM  Result Value Ref Range   Lipase 24 11 - 51 U/L    Comment: Performed at [facility], [address] [place], [place] [address]  Blood culture (routine x 2)     Status: None (Preliminary result)   Collection Time: 09/16/19 12:15 PM   Specimen: Right Antecubital; Blood  Result Value Ref Range   Specimen Description      RIGHT ANTECUBITAL Performed at [facility], [address] [place], [place] [address]    Special Requests      BOTTLES DRAWN AEROBIC AND ANAEROBIC Blood Culture adequate volume Performed at [facility], [address] [place], [place] [address]    Culture  Setup Time      IN BOTH AEROBIC AND ANAEROBIC BOTTLES GRAM NEGATIVE RODS CRITICAL VALUE NOTED.  VALUE IS CONSISTENT WITH PREVIOUSLY REPORTED AND CALLED VALUE.    Culture      NO GROWTH < 24 HOURS Performed at [facility], [place] [address], [place], [place] [address]    Report Status PENDING   Blood culture (routine x 2)     Status: None (Preliminary result)   Collection Time: 09/16/19 12:30 PM   Specimen: BLOOD LEFT WRIST  Result Value Ref Range   Specimen Description      BLOOD LEFT WRIST Performed at [facility], [address] [place], [place] [address]    Special Requests      BOTTLES DRAWN AEROBIC AND ANAEROBIC Blood Culture adequate volume Performed at [facility], [address] [place], [place] [address]    Culture  Setup Time      IN BOTH AEROBIC AND ANAEROBIC BOTTLES GRAM NEGATIVE RODS CRITICAL RESULT CALLED TO, READ BACK BY AND VERIFIED WITH: B GREEN PHARMD 09/17/19 0428 JDW    Culture      CULTURE REINCUBATED FOR BETTER GROWTH Performed at [facility], [place] [address], [place], [place] [address]    Report Status PENDING   Blood Culture ID Panel (Reflexed)     Status: Abnormal   Collection Time: 09/16/19 12:30 PM   Result Value Ref Range   Enterococcus species NOT DETECTED NOT DETECTED   Listeria monocytogenes NOT DETECTED NOT DETECTED   Staphylococcus species NOT DETECTED NOT DETECTED   Staphylococcus aureus (BCID) NOT DETECTED NOT DETECTED   Streptococcus species NOT DETECTED NOT DETECTED   Streptococcus agalactiae NOT DETECTED NOT DETECTED   Streptococcus pneumoniae NOT DETECTED NOT DETECTED   Streptococcus pyogenes NOT DETECTED NOT DETECTED   Acinetobacter baumannii  NOT DETECTED NOT DETECTED   Enterobacteriaceae species DETECTED (A) NOT DETECTED    Comment: Enterobacteriaceae represent a large family of gram-negative bacteria, not a single organism. CRITICAL RESULT CALLED TO, READ BACK BY AND VERIFIED WITH: B GREEN PHARMD 09/17/19 0428 JDW    Enterobacter cloacae complex NOT DETECTED NOT DETECTED   Escherichia coli DETECTED (A) NOT DETECTED    Comment: CRITICAL RESULT CALLED TO, READ BACK BY AND VERIFIED WITH: B GREEN PHARMD 09/17/19 0428 JDW    Klebsiella oxytoca NOT DETECTED NOT DETECTED   Klebsiella pneumoniae NOT DETECTED NOT DETECTED   Proteus species NOT DETECTED NOT DETECTED   Serratia marcescens NOT DETECTED NOT DETECTED   Carbapenem resistance NOT DETECTED NOT DETECTED   Haemophilus influenzae NOT DETECTED NOT DETECTED   Neisseria meningitidis NOT DETECTED NOT DETECTED   Pseudomonas aeruginosa NOT DETECTED NOT DETECTED   Candida albicans NOT DETECTED NOT DETECTED   Candida glabrata NOT DETECTED NOT DETECTED   Candida krusei NOT DETECTED NOT DETECTED   Candida parapsilosis NOT DETECTED NOT DETECTED   Candida tropicalis NOT DETECTED NOT DETECTED    Comment: Performed at [facility], [address] [place], [place] [address]  Lactic acid, plasma     Status: Abnormal   Collection Time: 09/16/19 12:39 PM  Result Value Ref Range   Lactic Acid, Venous 2.9 (HH) 0.5 - 1.9 mmol/L    Comment: CRITICAL RESULT CALLED TO, READ BACK BY AND VERIFIED WITH: CALLED TO M.SIMS AT 1313  ON [id number] BY [facility] Performed at [facility], [address] [place], [place] [address]   SARS CORONAVIRUS 2 (TAT 6-24 HRS) Nasopharyngeal Nasopharyngeal Swab     Status: None   Collection Time: 09/16/19 12:50 PM   Specimen: Nasopharyngeal Swab  Result Value Ref Range   SARS Coronavirus 2 NEGATIVE NEGATIVE    Comment: (NOTE) SARS-CoV-2 target nucleic acids are NOT DETECTED. The SARS-CoV-2 RNA is generally detectable in upper and lower respiratory specimens during the acute phase of infection. Negative results do not preclude SARS-CoV-2 infection, do not rule out co-infections with other pathogens, and should not be used as the sole basis for treatment or other patient management decisions. Negative results must be combined with clinical observations, patient history, and epidemiological information. The expected result is Negative. Fact Sheet for Patients: SugarRoll.be Fact Sheet for Healthcare Providers: https://www.woods-mathews.com/ This test is not yet approved or cleared by the [place] FDA and  has been authorized for detection and/or diagnosis of SARS-CoV-2 by FDA under an Emergency Use Authorization (EUA). This EUA will remain  in effect (meaning this test can be used) for the duration of the COVID-19 declaration under Section 56 4(b)(1) of the Act, 21 U.S.C. section 360bbb-3(b)(1), unless the authorization is terminated or revoked sooner. Performed at [facility], [place] [address], [place], [place] [address]   Lactic acid, plasma     Status: None   Collection Time: 09/16/19  2:46 PM  Result Value Ref Range   Lactic Acid, Venous 1.9 0.5 - 1.9 mmol/L    Comment: Performed at [facility], [address] [place], [place] [address]  Urinalysis, Routine w reflex microscopic     Status: Abnormal   Collection Time: 09/16/19  2:55 PM  Result Value Ref Range   Color, Urine YELLOW YELLOW   APPearance  CLEAR CLEAR   Specific Gravity, Urine 1.010 1.005 - 1.030   pH 7.0 5.0 - 8.0   Glucose, UA NEGATIVE  NEGATIVE mg/dL   Hgb urine dipstick SMALL (A) NEGATIVE   Bilirubin Urine NEGATIVE NEGATIVE   Ketones, ur NEGATIVE NEGATIVE mg/dL   Protein, ur 100 (A) NEGATIVE mg/dL   Nitrite NEGATIVE NEGATIVE   Leukocytes,Ua NEGATIVE NEGATIVE    Comment: Performed at [facility], [address] [place], [place] [address]  Urinalysis, Microscopic (reflex)     Status: Abnormal   Collection Time: 09/16/19  2:55 PM  Result Value Ref Range   RBC / HPF 0-5 0 - 5 RBC/hpf   WBC, UA NONE SEEN 0 - 5 WBC/hpf   Bacteria, UA RARE (A) NONE SEEN   Squamous Epithelial / LPF 6-10 0 - 5    Comment: Performed at [facility], [address] [place], [place] [address]  MRSA PCR Screening     Status: None   Collection Time: 09/16/19 11:34 PM   Specimen: Nasal Mucosa; Nasopharyngeal  Result Value Ref Range   MRSA by PCR NEGATIVE NEGATIVE    Comment:        The GeneXpert MRSA Assay (FDA approved for NASAL specimens only), is one component of a comprehensive MRSA colonization surveillance program. It is not intended to diagnose MRSA infection nor to guide or monitor treatment for MRSA infections. Performed at [facility], [place] [address] [place], [place] [address]   Lactic acid, plasma     Status: None   Collection Time: 09/17/19 12:02 AM  Result Value Ref Range   Lactic Acid, Venous 1.5 0.5 - 1.9 mmol/L    Comment: Performed at [facility], [place] [address], [place], [place] [address]  Hemoglobin A1c     Status: Abnormal   Collection Time: 09/17/19 12:30 AM  Result Value Ref Range   Hgb A1c MFr Bld 6.1 (H) 4.8 - 5.6 %    Comment: (NOTE) Pre diabetes:          5.7%-6.4% Diabetes:              >6.4% Glycemic control for   <7.0% adults with diabetes    Mean Plasma Glucose 128.37 mg/dL    Comment: Performed at [facility] [address] [place], [place] [address]  Glucose, capillary     Status: Abnormal   Collection Time: 09/17/19  1:34 AM  Result Value Ref Range   Glucose-Capillary 116 (H) 70 - 99 mg/dL  CBC     Status: Abnormal   Collection Time: 09/17/19  2:33 AM  Result Value Ref Range   WBC 20.2 (H) 4.0 - 10.5 K/uL   RBC 4.04 (L) 4.22 - 5.81 MIL/uL   Hemoglobin 10.6 (L) 13.0 - 17.0 g/dL   HCT 35.1 (L) 39.0 - 52.0 %   MCV 86.9 80.0 - 100.0 fL   MCH 26.2 26.0 - 34.0 pg   MCHC 30.2 30.0 - 36.0 g/dL   RDW 15.9 (H) 11.5 - 15.5 %   Platelets 219 150 - 400 K/uL   nRBC 0.0 0.0 - 0.2 %    Comment: Performed at [facility], [place] [address] [place],  [address]  Comprehensive metabolic panel     Status: Abnormal   Collection Time: 09/17/19  2:33 AM  Result Value Ref Range   Sodium 135 135 - 145 mmol/L   Potassium 4.5 3.5 - 5.1 mmol/L   Chloride 103 98 - 111 mmol/L   CO2 21 (L) 22 - 32 mmol/L   Glucose, Bld 115 (H)  70 - 99 mg/dL   BUN 17 8 - 23 mg/dL   Creatinine, Ser 1.06 0.61 - 1.24 mg/dL   Calcium 9.0 8.9 - 10.3 mg/dL   Total Protein 6.6 6.5 - 8.1 g/dL   Albumin 3.1 (L) 3.5 - 5.0 g/dL   AST 111 (H) 15 - 41 U/L   ALT 167 (H) 0 - 44 U/L   Alkaline Phosphatase 133 (H) 38 - 126 U/L   Total Bilirubin 1.5 (H) 0.3 - 1.2 mg/dL   GFR calc non Af Amer >60 >60 mL/min   GFR calc Af Amer >60 >60 mL/min   Anion gap 11 5 - 15    Comment: Performed at [facility], [place] [address], [place], [place] [address]  Lactic acid, plasma     Status: None   Collection Time: 09/17/19  2:33 AM  Result Value Ref Range   Lactic Acid, Venous 1.4 0.5 - 1.9 mmol/L    Comment: Performed at [facility], [place] [address], [place], [place] [address]  Hepatic function panel     Status: Abnormal   Collection Time: 09/17/19  2:33 AM  Result Value Ref Range   Total Protein 6.6 6.5 - 8.1 g/dL   Albumin 3.1 (L) 3.5 - 5.0 g/dL   AST 112 (H) 15 - 41 U/L   ALT 166 (H) 0 - 44 U/L    Alkaline Phosphatase 132 (H) 38 - 126 U/L   Total Bilirubin 1.4 (H) 0.3 - 1.2 mg/dL   Bilirubin, Direct 0.8 (H) 0.0 - 0.2 mg/dL   Indirect Bilirubin 0.6 0.3 - 0.9 mg/dL    Comment: Performed at [facility], [place] [address] [place],  [address]  Glucose, capillary     Status: Abnormal   Collection Time: 09/17/19  3:29 AM  Result Value Ref Range   Glucose-Capillary 110 (H) 70 - 99 mg/dL  Glucose, capillary     Status: Abnormal   Collection Time: 09/17/19  7:54 AM  Result Value Ref Range   Glucose-Capillary 115 (H) 70 - 99 mg/dL   CT HEAD WO CONTRAST  Result Date: 09/17/2019 CLINICAL DATA:  Encephalopathy. EXAM: CT HEAD WITHOUT CONTRAST TECHNIQUE: Contiguous axial images were obtained from the base of the skull through the vertex without intravenous contrast. COMPARISON:  October 02, 2012. FINDINGS: Brain: Mild diffuse cortical atrophy is noted. Mild chronic ischemic white matter disease is noted. No mass effect or midline shift is noted. Ventricular size is within normal limits. There is no evidence of mass lesion, hemorrhage or acute infarction. Vascular: No hyperdense vessel or unexpected calcification. Skull: Normal. Negative for fracture or focal lesion. Sinuses/Orbits: No acute finding. Other: None. IMPRESSION: Mild diffuse cortical atrophy. Mild chronic ischemic white matter disease. No acute intracranial abnormality seen. Electronically Signed   By: Marijo Conception M.D.   On: 09/17/2019 11:46   CT ABDOMEN PELVIS W CONTRAST  Result Date: 09/16/2019 CLINICAL DATA:  Acute abdominal pain, generalized. EXAM: CT ABDOMEN AND PELVIS WITH CONTRAST TECHNIQUE: Multidetector CT imaging of the abdomen and pelvis was performed using the standard protocol following bolus administration of intravenous contrast. CONTRAST:  147mL OMNIPAQUE IOHEXOL 300 MG/ML  SOLN COMPARISON:  10/03/2018 FINDINGS: Lower chest: Small subpleural lymph node in the right lower lobe stable since 2016.  Signs of scarring and mild subpleural reticulation, unchanged. No signs of consolidation or evidence of pleural effusion. Hepatobiliary: Liver is unremarkable. There is moderate distension of the gallbladder with signs of gallbladder wall thickening  and pericholecystic stranding. Wall thickness difficult to read on CT but is much as 4 mm despite the distended appearance of the gallbladder. There is no intra or extrahepatic biliary ductal distension. Pancreas: Unremarkable. No pancreatic ductal dilatation or surrounding inflammatory changes. Spleen: Normal in size without focal abnormality. Adrenals/Urinary Tract: Signs of right nephrectomy with stable small area of soft tissue nodularity along the ligated right renal artery measuring approximately 1.5 cm this has been present since 2007. Adrenal glands are normal. Small presumed cysts in the left kidney are unchanged. Stomach/Bowel: No signs of acute gastrointestinal process. Normal appendix. Vascular/Lymphatic: Calcified atherosclerotic changes of the abdominal aorta up to 3 x 2.7 cm in greatest axial dimension. No signs of adenopathy in the upper abdomen or retroperitoneum. No signs of pelvic adenopathy. Vascular disease extends into branch vessels throughout the abdomen and pelvis. Reproductive: Prostatomegaly. No sign of pelvic adenopathy. Other: No signs of free air. No abscess. Musculoskeletal: Spinal degenerative changes with signs of lower lumbar laminectomy. Grade 1 anterolisthesis of L5 on S1. Finding similar to prior study. Also mild anterolisthesis of L4 on L5. IMPRESSION: 1. CT findings of acute cholecystitis. 2. Signs of right nephrectomy with stable small area of soft tissue nodularity along the ligated right renal artery. Unchanged since 2007. 3. Aortic atherosclerosis. 4. Prostatomegaly. Aortic Atherosclerosis (ICD10-I70.0). Electronically Signed   By: Zetta Bills M.D.   On: 09/16/2019 12:39   NM Hepato W/EjeCT Fract  Result Date:  09/17/2019 CLINICAL DATA:  RIGHT upper quadrant pain with nausea and vomiting for 3 days EXAM: NUCLEAR MEDICINE HEPATOBILIARY IMAGING TECHNIQUE: Sequential images of the abdomen were obtained out to 60 minutes following intravenous administration of radiopharmaceutical. RADIOPHARMACEUTICALS:  5.35 mCi Tc-70m  Choletec IV Pharmaceutical: Morphine sulfate 3 mg IV COMPARISON:  Ultrasound abdomen 09/16/2019 FINDINGS: Normal tracer extraction from bloodstream indicating normal hepatocellular function. Normal excretion of tracer into biliary tree. Small bowel visualized at 14 minutes. At 1 hour, gallbladder had not visualized. Patient then received morphine and imaging was continued for an additional 30 minutes. Gallbladder failed to visualize following morphine augmentation. Findings are consistent with cystic duct obstruction and acute cholecystitis. IMPRESSION: Patent CBD. Nonvisualization of the gallbladder despite morphine augmentation consistent with cystic duct obstruction and acute cholecystitis. Electronically Signed   By: Lavonia Dana M.D.   On: 09/17/2019 14:36   US Abdomen Limited  Result Date: 09/16/2019 CLINICAL DATA:  Abdominal pain. CT abdomen and pelvis today demonstrated findings worrisome for cholecystitis. EXAM: ULTRASOUND ABDOMEN LIMITED RIGHT UPPER QUADRANT COMPARISON:  CT abdomen and pelvis earlier today. FINDINGS: Gallbladder: No gallstones or pericholecystic fluid. Sonographer reports negative Murphy's sign. The gallbladder is mildly distended and there is minimal wall thickening at 0.4 cm. Common bile duct: Diameter: 0.8 cm Liver: No focal lesion. Echogenicity is increased. Portal vein is patent on color Doppler imaging with normal direction of blood flow towards the liver. Other: None. IMPRESSION: Negative for gallstones or acute cholecystitis. Fatty infiltration of the liver. Electronically Signed   By: Inge Rise M.D.   On: 09/16/2019 13:59   DG Chest Portable 1 View  Result  Date: 09/16/2019 CLINICAL DATA:  Fever, dyspnea. EXAM: PORTABLE CHEST 1 VIEW COMPARISON:  Chest radiograph 12/12/2018 FINDINGS: The patient is significantly rotated to the right, limiting evaluation. Heart size within normal limits. No definite airspace consolidation within the lungs. No evidence of pleural effusion or pneumothorax. No acute bony abnormality. Overlying cardiac monitoring leads. IMPRESSION: Patient significantly rotated to the right, limiting evaluation. No definite airspace consolidation. Electronically  Signed   By: Kellie Simmering DO   On: 09/16/2019 15:16      Assessment/Plan Atrial fibrillation Coronary artery disease Peripheral artery disease Rheumatoid arthritis History of CVA History of renal cell carcinoma, status post nephrectomy History of prostate cancer Hypertension Chronic kidney disease  Acute cholecystitis with bacteremia and sepsis The patient has a positive HIDA scan consistent with acute cholecystitis along with multiple gram-negative bacteria in his blood that is almost certainly from his cholecystitis.  Given the patient's current encephalopathy, frailty, multiple medical problems, bacteremia and sepsis, the patient is high risk for surgical intervention at this time.  We will consult interventional radiology for placement of a percutaneous cholecystostomy tube.  I have discussed this with the patient's wife as well as the patient's son via phone.  Both understand the situation and why surgery is not recommended at this time.  They also both understand the procedure of cholecystostomy tube placement and that this will likely be in for 6 to 8 weeks.  He will then need a cardiac evaluation for clearance and to determine if he is a surgical candidate at that time to undergo cholecystectomy.  The son seems to understand this better than the wife, but she does also agree to move forward with this plan.  Thank you for this consultation and we will continue to follow the  patient with you.   FEN -NPO VTE -Plavix on hold, Lovenox ID -[place], [facility] 09/17/2019, 4:31 PM Please see Amion for pager number during day hours 7:00am-4:30pm

## 2019-09-17 NOTE — Progress Notes (Signed)
PHARMACY - PHYSICIAN COMMUNICATION CRITICAL VALUE ALERT - BLOOD CULTURE IDENTIFICATION (BCID)  Derrick [name] is an 85 y.o. male who presented to [facility] on 09/16/2019 with a chief complaint of abdominal pain.   Assessment:  Cholecystitis? (include suspected source if known) 4 of 4 bottles e coli.   Name of physician (or Provider) ContactedLorra Hals, NP  Current antibiotics: Zosyn  Changes to prescribed antibiotics recommended:  Patient is on recommended antibiotics - No changes needed  Results for orders placed or performed during the hospital encounter of 09/16/19  Blood Culture ID Panel (Reflexed) (Collected: 09/16/2019 12:30 PM)  Result Value Ref Range   Enterococcus species NOT DETECTED NOT DETECTED   Listeria monocytogenes NOT DETECTED NOT DETECTED   Staphylococcus species NOT DETECTED NOT DETECTED   Staphylococcus aureus (BCID) NOT DETECTED NOT DETECTED   Streptococcus species NOT DETECTED NOT DETECTED   Streptococcus agalactiae NOT DETECTED NOT DETECTED   Streptococcus pneumoniae NOT DETECTED NOT DETECTED   Streptococcus pyogenes NOT DETECTED NOT DETECTED   Acinetobacter baumannii NOT DETECTED NOT DETECTED   Enterobacteriaceae species DETECTED (A) NOT DETECTED   Enterobacter cloacae complex NOT DETECTED NOT DETECTED   Escherichia coli DETECTED (A) NOT DETECTED   Klebsiella oxytoca NOT DETECTED NOT DETECTED   Klebsiella pneumoniae NOT DETECTED NOT DETECTED   Proteus species NOT DETECTED NOT DETECTED   Serratia marcescens NOT DETECTED NOT DETECTED   Carbapenem resistance NOT DETECTED NOT DETECTED   Haemophilus influenzae NOT DETECTED NOT DETECTED   Neisseria meningitidis NOT DETECTED NOT DETECTED   Pseudomonas aeruginosa NOT DETECTED NOT DETECTED   Candida albicans NOT DETECTED NOT DETECTED   Candida glabrata NOT DETECTED NOT DETECTED   Candida krusei NOT DETECTED NOT DETECTED   Candida parapsilosis NOT DETECTED NOT DETECTED   Candida tropicalis NOT DETECTED NOT  DETECTED    Dorrene German 09/17/2019  4:36 AM

## 2019-09-17 NOTE — Progress Notes (Signed)
Patient's office computer chart was reviewed and he is not seeing [place] since 2011 when he had a endoscopy and colonoscopy and patient's hospital computer chart reviewed and case discussed with the other GI team and we are awaiting a HIDA scan and if negative for acute cholecystitis probably would need a MRCP next and please call us if any question or problem otherwise we will see later today or tomorrow

## 2019-09-17 NOTE — Progress Notes (Signed)
PROGRESS NOTE    SAGE MON  [id number] DOB: 11-16-1931 DOA: 09/16/2019 PCP: [name], MD    Brief Narrative:  HPI per [name] is a 85 y.o. male with medical history significant of RA on prednisone and hydroxychloroquine, CAD and PAD, HTN.  Patient presented to [facility] ED with c/o periumbilical to lower abd pain, onset 3 days ago.  Associated with N/V.  No change in BM.  No urinary complaints.  Went to PCP today and referred to ED.   ED Course: In the ED TTP periumbilically and suprapubically without rebound or guarding.  He is septic with WBC 17k, Tm 101.6, HR 134.  Not hypotensive yet though.  Given 2L bolus.  LFTs noted to be significantly elevated with AST 118 and ALT 159.  COVID RT-PCR is negative.  Initial lactate 2.9, improves to 1.9 after IVF.  CT abd / pelvis: 1) lung bases are clear 2) findings worrisome for acute cholecystitis  [place] abd: neg for acute cholecystitis.  gen surg consulted: put on ABx, admit, get HIDA, and consult them if positive.  Although EDP note nor transfer note makes any mention of encephalopathy while in ED, by the time he arrives to the SDU here at [facility], he is encephalopathic.   Assessment & Plan:   Principal Problem:   Sepsis ([place]) Active Problems:   Ascending cholangitis   E coli bacteremia   HTN (hypertension)   GERD (gastroesophageal reflux disease)   CAD (coronary artery disease)   Osteomyelitis (HCC)   Acute metabolic encephalopathy   A-fib (HCC)   Transaminitis   Nausea and vomiting   Rheumatoid arthritis ([place])  #1 sepsis secondary to probable ascending cholangitis/abdominal pain/E. coli bacteremia Patient presented with periumbilical to lower abdominal pain, fevers, nausea and vomiting.  Abdominal ultrasound done in the ED negative for acute cholecystitis.  CT abdomen and pelvis done worrisome for acute cholecystitis.  Patient noted to have a transaminitis, rapidly progressive encephalopathy  on admission, with positive blood cultures of E. coli.  Patient on IV fluids.  HIDA scan ordered and pending.  Continue empiric IV Zosyn.  Continue IV fluids.  Patient started on stress dose IV Solu-Cortef 50 mg every 8 hours as patient on chronic steroids for RA and due to worsening encephalopathy.  Consult with GI for further evaluation and management.  May need ERCP with decompression.  2.  Rheumatoid arthritis Plaquenil and prednisone on hold.  Patient on IV stress dose steroids.  Likely resume Plaquenil once acute infection has resolved.  3.  History of atrial fibrillation Currently rate controlled.  Patient on chronic Plavix for coronary artery disease/PAD but not on chronic blood thinners.  Will hold Plavix for now pending evaluation by GI.  4.  Acute metabolic encephalopathy Likely secondary to problem #1.  Patient also with a E. coli bacteremia.  Head CT pending.  Continue IV Zosyn.  Follow.  5.  Coronary artery disease/PAD Stable.  Hold Plavix for now pending evaluation by GI for problem #1 just in case patient needs to undergo her procedure.  6.  Hypertension Continue to hold antihypertensive blood pressure medications.   DVT prophylaxis: Lovenox Code Status: Full Family Communication: Updated patient.  No family at bedside. Disposition Plan: To be determined.   Consultants:   GI pending  Procedures:   CT abdomen and pelvis 09/16/2019  Right upper quadrant ultrasound 09/16/2019  Chest x-ray 09/16/2019  Antimicrobials:   IV Zosyn 09/16/2019   Subjective: Patient laying in bed somewhat  confused however following commands appropriately.  Alert to self only.  Denies any chest pain or shortness of breath.  States abdominal pain has improved.  Objective: Vitals:   09/17/19 0500 09/17/19 0600 09/17/19 0700 09/17/19 0800  BP: (!) 155/83 126/75 (!) 149/77   Pulse: (!) 110 (!) 102 (!) 101   Resp: 20 15 15    Temp:    99.1 F (37.3 C)  TempSrc:    Axillary  SpO2: 100%  99% 100%   Weight:      Height:        Intake/Output Summary (Last 24 hours) at 09/17/2019 1028 Last data filed at 09/17/2019 0800 Gross per 24 hour  Intake 1970.37 ml  Output -  Net 1970.37 ml   Filed Weights   09/16/19 1110  Weight: 80.8 kg    Examination:  General exam: Confusion.  Dry mucous membranes. Respiratory system: Clear to auscultation anterior lung fields.  No wheezes, no crackles, no rhonchi. Respiratory effort normal. Cardiovascular system: Irregularly irregular.  No JVD.  No lower extremity edema.  Gastrointestinal system: Abdomen is soft, nontender, nondistended, positive bowel sounds.  No rebound.  No guarding. Central nervous system: Alert to self only.  Moving extremities spontaneously.  Somewhat confused.  Extremities: Symmetric 5 x 5 power. Skin: No rashes, lesions or ulcers Psychiatry: Judgement and insight appear poor. Mood & affect appropriate.     Data Reviewed: I have personally reviewed following labs and imaging studies  CBC: Recent Labs  Lab 09/16/19 1113 09/17/19 0233  WBC 17.6* 20.2*  NEUTROABS 15.0*  --   HGB 11.3* 10.6*  HCT 36.6* 35.1*  MCV 82.4 86.9  PLT 277 [id number]   Basic Metabolic Panel: Recent Labs  Lab 09/16/19 1113 09/17/19 0233  NA 132* 135  K 4.2 4.5  CL 101 103  CO2 22 21*  GLUCOSE 184* 115*  BUN 15 17  CREATININE 1.06 1.06  CALCIUM 9.2 9.0   GFR: Estimated Creatinine Clearance: 56.9 mL/min (by C-G formula based on SCr of 1.06 mg/dL). Liver Function Tests: Recent Labs  Lab 09/16/19 1113 09/17/19 0233  AST 118* 112*  111*  ALT 159* 166*  167*  ALKPHOS 112 132*  133*  BILITOT 1.1 1.4*  1.5*  PROT 7.0 6.6  6.6  ALBUMIN 3.6 3.1*  3.1*   Recent Labs  Lab 09/16/19 1113  LIPASE 24   No results for input(s): AMMONIA in the last 168 hours. Coagulation Profile: No results for input(s): INR, PROTIME in the last 168 hours. Cardiac Enzymes: No results for input(s): CKTOTAL, CKMB, CKMBINDEX, TROPONINI in  the last 168 hours. BNP (last 3 results) No results for input(s): PROBNP in the last 8760 hours. HbA1C: Recent Labs    09/17/19 0030  HGBA1C 6.1*   CBG: Recent Labs  Lab 09/17/19 0134 09/17/19 0329 09/17/19 0754  GLUCAP 116* 110* 115*   Lipid Profile: No results for input(s): CHOL, HDL, LDLCALC, TRIG, CHOLHDL, LDLDIRECT in the last 72 hours. Thyroid Function Tests: No results for input(s): TSH, T4TOTAL, FREET4, T3FREE, THYROIDAB in the last 72 hours. Anemia Panel: No results for input(s): VITAMINB12, FOLATE, FERRITIN, TIBC, IRON, RETICCTPCT in the last 72 hours. Sepsis Labs: Recent Labs  Lab 09/16/19 1239 09/16/19 1446 09/17/19 0002 09/17/19 0233  LATICACIDVEN 2.9* 1.9 1.5 1.4    Recent Results (from the past 240 hour(s))  Blood culture (routine x 2)     Status: None (Preliminary result)   Collection Time: 09/16/19 12:15 PM   Specimen: Right Antecubital; Blood  Result Value Ref Range Status   Specimen Description   Final    RIGHT ANTECUBITAL Performed at [facility], [address] [place], [place] [address]    Special Requests   Final    BOTTLES DRAWN AEROBIC AND ANAEROBIC Blood Culture adequate volume Performed at [facility], [address] [place], [place] [address]    Culture  Setup Time   Final    IN BOTH AEROBIC AND ANAEROBIC BOTTLES GRAM NEGATIVE RODS CRITICAL VALUE NOTED.  VALUE IS CONSISTENT WITH PREVIOUSLY REPORTED AND CALLED VALUE.    Culture   Final    NO GROWTH < 24 HOURS Performed at [facility], [place] [address], [place], [place] [address]    Report Status PENDING  Incomplete  Blood culture (routine x 2)     Status: None (Preliminary result)   Collection Time: 09/16/19 12:30 PM   Specimen: BLOOD LEFT WRIST  Result Value Ref Range Status   Specimen Description   Final    BLOOD LEFT WRIST Performed at [facility], [address] [place], [place] [address]    Special Requests   Final    BOTTLES  DRAWN AEROBIC AND ANAEROBIC Blood Culture adequate volume Performed at [facility], [address] [place], [place] [address]    Culture  Setup Time   Final    IN BOTH AEROBIC AND ANAEROBIC BOTTLES GRAM NEGATIVE RODS Organism ID to follow    Culture   Final    NO GROWTH < 24 HOURS Performed at [facility], [place] [address], [place], [place] [address]    Report Status PENDING  Incomplete  Blood Culture ID Panel (Reflexed)     Status: Abnormal   Collection Time: 09/16/19 12:30 PM  Result Value Ref Range Status   Enterococcus species NOT DETECTED NOT DETECTED Final   Listeria monocytogenes NOT DETECTED NOT DETECTED Final   Staphylococcus species NOT DETECTED NOT DETECTED Final   Staphylococcus aureus (BCID) NOT DETECTED NOT DETECTED Final   Streptococcus species NOT DETECTED NOT DETECTED Final   Streptococcus agalactiae NOT DETECTED NOT DETECTED Final   Streptococcus pneumoniae NOT DETECTED NOT DETECTED Final   Streptococcus pyogenes NOT DETECTED NOT DETECTED Final   Acinetobacter baumannii NOT DETECTED NOT DETECTED Final   Enterobacteriaceae species DETECTED (A) NOT DETECTED Final    Comment: Enterobacteriaceae represent a large family of gram-negative bacteria, not a single organism. CRITICAL RESULT CALLED TO, READ BACK BY AND VERIFIED WITH: B GREEN PHARMD 09/17/19 0428 JDW    Enterobacter cloacae complex NOT DETECTED NOT DETECTED Final   Escherichia coli DETECTED (A) NOT DETECTED Final    Comment: CRITICAL RESULT CALLED TO, READ BACK BY AND VERIFIED WITH: B GREEN PHARMD 09/17/19 0428 JDW    Klebsiella oxytoca NOT DETECTED NOT DETECTED Final   Klebsiella pneumoniae NOT DETECTED NOT DETECTED Final   Proteus species NOT DETECTED NOT DETECTED Final   Serratia marcescens NOT DETECTED NOT DETECTED Final   Carbapenem resistance NOT DETECTED NOT DETECTED Final   Haemophilus influenzae NOT DETECTED NOT DETECTED Final   Neisseria meningitidis NOT DETECTED NOT DETECTED  Final   Pseudomonas aeruginosa NOT DETECTED NOT DETECTED Final   Candida albicans NOT DETECTED NOT DETECTED Final   Candida glabrata NOT DETECTED NOT DETECTED Final   Candida krusei NOT DETECTED NOT DETECTED Final   Candida parapsilosis NOT DETECTED NOT DETECTED Final   Candida tropicalis NOT DETECTED NOT DETECTED Final  Comment: Performed at [facility], [place] [address] [place], [place] [address]  SARS CORONAVIRUS 2 (TAT 6-24 HRS) Nasopharyngeal Nasopharyngeal Swab     Status: None   Collection Time: 09/16/19 12:50 PM   Specimen: Nasopharyngeal Swab  Result Value Ref Range Status   SARS Coronavirus 2 NEGATIVE NEGATIVE Final    Comment: (NOTE) SARS-CoV-2 target nucleic acids are NOT DETECTED. The SARS-CoV-2 RNA is generally detectable in upper and lower respiratory specimens during the acute phase of infection. Negative results do not preclude SARS-CoV-2 infection, do not rule out co-infections with other pathogens, and should not be used as the sole basis for treatment or other patient management decisions. Negative results must be combined with clinical observations, patient history, and epidemiological information. The expected result is Negative. Fact Sheet for Patients: SugarRoll.be Fact Sheet for Healthcare Providers: https://www.woods-mathews.com/ This test is not yet approved or cleared by the [place] FDA and  has been authorized for detection and/or diagnosis of SARS-CoV-2 by FDA under an Emergency Use Authorization (EUA). This EUA will remain  in effect (meaning this test can be used) for the duration of the COVID-19 declaration under Section 56 4(b)(1) of the Act, 21 U.S.C. section 360bbb-3(b)(1), unless the authorization is terminated or revoked sooner. Performed at [facility], [place] [address], [place], [place] [address]   MRSA PCR Screening     Status: None   Collection Time: 09/16/19 11:34 PM    Specimen: Nasal Mucosa; Nasopharyngeal  Result Value Ref Range Status   MRSA by PCR NEGATIVE NEGATIVE Final    Comment:        The GeneXpert MRSA Assay (FDA approved for NASAL specimens only), is one component of a comprehensive MRSA colonization surveillance program. It is not intended to diagnose MRSA infection nor to guide or monitor treatment for MRSA infections. Performed at [facility], [place] [address], [place], [place] [address]          Radiology Studies: CT ABDOMEN PELVIS W CONTRAST  Result Date: 09/16/2019 CLINICAL DATA:  Acute abdominal pain, generalized. EXAM: CT ABDOMEN AND PELVIS WITH CONTRAST TECHNIQUE: Multidetector CT imaging of the abdomen and pelvis was performed using the standard protocol following bolus administration of intravenous contrast. CONTRAST:  128mL OMNIPAQUE IOHEXOL 300 MG/ML  SOLN COMPARISON:  10/03/2018 FINDINGS: Lower chest: Small subpleural lymph node in the right lower lobe stable since 2016. Signs of scarring and mild subpleural reticulation, unchanged. No signs of consolidation or evidence of pleural effusion. Hepatobiliary: Liver is unremarkable. There is moderate distension of the gallbladder with signs of gallbladder wall thickening and pericholecystic stranding. Wall thickness difficult to read on CT but is much as 4 mm despite the distended appearance of the gallbladder. There is no intra or extrahepatic biliary ductal distension. Pancreas: Unremarkable. No pancreatic ductal dilatation or surrounding inflammatory changes. Spleen: Normal in size without focal abnormality. Adrenals/Urinary Tract: Signs of right nephrectomy with stable small area of soft tissue nodularity along the ligated right renal artery measuring approximately 1.5 cm this has been present since 2007. Adrenal glands are normal. Small presumed cysts in the left kidney are unchanged. Stomach/Bowel: No signs of acute gastrointestinal process. Normal appendix.  Vascular/Lymphatic: Calcified atherosclerotic changes of the abdominal aorta up to 3 x 2.7 cm in greatest axial dimension. No signs of adenopathy in the upper abdomen or retroperitoneum. No signs of pelvic adenopathy. Vascular disease extends into branch vessels throughout the abdomen and pelvis. Reproductive: Prostatomegaly. No sign of pelvic adenopathy. Other:  No signs of free air. No abscess. Musculoskeletal: Spinal degenerative changes with signs of lower lumbar laminectomy. Grade 1 anterolisthesis of L5 on S1. Finding similar to prior study. Also mild anterolisthesis of L4 on L5. IMPRESSION: 1. CT findings of acute cholecystitis. 2. Signs of right nephrectomy with stable small area of soft tissue nodularity along the ligated right renal artery. Unchanged since 2007. 3. Aortic atherosclerosis. 4. Prostatomegaly. Aortic Atherosclerosis (ICD10-I70.0). Electronically Signed   By: Zetta Bills M.D.   On: 09/16/2019 12:39   US Abdomen Limited  Result Date: 09/16/2019 CLINICAL DATA:  Abdominal pain. CT abdomen and pelvis today demonstrated findings worrisome for cholecystitis. EXAM: ULTRASOUND ABDOMEN LIMITED RIGHT UPPER QUADRANT COMPARISON:  CT abdomen and pelvis earlier today. FINDINGS: Gallbladder: No gallstones or pericholecystic fluid. Sonographer reports negative Murphy's sign. The gallbladder is mildly distended and there is minimal wall thickening at 0.4 cm. Common bile duct: Diameter: 0.8 cm Liver: No focal lesion. Echogenicity is increased. Portal vein is patent on color Doppler imaging with normal direction of blood flow towards the liver. Other: None. IMPRESSION: Negative for gallstones or acute cholecystitis. Fatty infiltration of the liver. Electronically Signed   By: Inge Rise M.D.   On: 09/16/2019 13:59   DG Chest Portable 1 View  Result Date: 09/16/2019 CLINICAL DATA:  Fever, dyspnea. EXAM: PORTABLE CHEST 1 VIEW COMPARISON:  Chest radiograph 12/12/2018 FINDINGS: The patient is  significantly rotated to the right, limiting evaluation. Heart size within normal limits. No definite airspace consolidation within the lungs. No evidence of pleural effusion or pneumothorax. No acute bony abnormality. Overlying cardiac monitoring leads. IMPRESSION: Patient significantly rotated to the right, limiting evaluation. No definite airspace consolidation. Electronically Signed   By: Kellie Simmering DO   On: 09/16/2019 15:16        Scheduled Meds: . Chlorhexidine Gluconate Cloth  6 each Topical Daily  . enoxaparin (LOVENOX) injection  40 mg Subcutaneous Daily  . hydrocortisone sod succinate (SOLU-CORTEF) inj  50 mg Intravenous Q8H  . insulin aspart  0-9 Units Subcutaneous Q4H  . mouth rinse  15 mL Mouth Rinse BID   Continuous Infusions: . sodium chloride Stopped (09/16/19 1347)  . lactated ringers 125 mL/hr at 09/17/19 0800  . piperacillin-tazobactam (ZOSYN)  IV 3.375 g (09/17/19 0700)     LOS: 1 day    Time spent: 70 minutes    Irine Seal, MD [facility]  If 7PM-7AM, please contact night-coverage [email or web address] 09/17/2019, 10:28 AM

## 2019-09-17 NOTE — Progress Notes (Signed)
HIDA results noted surgical plans noted if patient does not improve with PERC drain or if liver tests continue to rise please get MRCP and call us back otherwise he will need a drain injection at some point to try to evaluate his CBD or an Intra-Op cholangiogram at the time of cholecystectomy

## 2019-09-18 ENCOUNTER — Inpatient Hospital Stay (HOSPITAL_COMMUNITY): Payer: Medicare Other

## 2019-09-18 ENCOUNTER — Encounter (HOSPITAL_COMMUNITY): Payer: Self-pay | Admitting: Internal Medicine

## 2019-09-18 DIAGNOSIS — A4151 Sepsis due to Escherichia coli [E. coli]: Principal | ICD-10-CM

## 2019-09-18 DIAGNOSIS — I4821 Permanent atrial fibrillation: Secondary | ICD-10-CM

## 2019-09-18 DIAGNOSIS — N183 Chronic kidney disease, stage 3 unspecified: Secondary | ICD-10-CM

## 2019-09-18 DIAGNOSIS — K810 Acute cholecystitis: Secondary | ICD-10-CM

## 2019-09-18 DIAGNOSIS — K219 Gastro-esophageal reflux disease without esophagitis: Secondary | ICD-10-CM

## 2019-09-18 HISTORY — PX: IR PERC CHOLECYSTOSTOMY: IMG2326

## 2019-09-18 LAB — CBC WITH DIFFERENTIAL/PLATELET
Abs Immature Granulocytes: 0.15 10*3/uL — ABNORMAL HIGH (ref 0.00–0.07)
Basophils Absolute: 0 10*3/uL (ref 0.0–0.1)
Basophils Relative: 0 %
Eosinophils Absolute: 0 10*3/uL (ref 0.0–0.5)
Eosinophils Relative: 0 %
HCT: 26.6 % — ABNORMAL LOW (ref 39.0–52.0)
Hemoglobin: 8 g/dL — ABNORMAL LOW (ref 13.0–17.0)
Immature Granulocytes: 1 %
Lymphocytes Relative: 3 %
Lymphs Abs: 0.5 10*3/uL — ABNORMAL LOW (ref 0.7–4.0)
MCH: 26 pg (ref 26.0–34.0)
MCHC: 30.1 g/dL (ref 30.0–36.0)
MCV: 86.4 fL (ref 80.0–100.0)
Monocytes Absolute: 1.1 10*3/uL — ABNORMAL HIGH (ref 0.1–1.0)
Monocytes Relative: 7 %
Neutro Abs: 14.3 10*3/uL — ABNORMAL HIGH (ref 1.7–7.7)
Neutrophils Relative %: 89 %
Platelets: 176 10*3/uL (ref 150–400)
RBC: 3.08 MIL/uL — ABNORMAL LOW (ref 4.22–5.81)
RDW: 16 % — ABNORMAL HIGH (ref 11.5–15.5)
WBC: 16.1 10*3/uL — ABNORMAL HIGH (ref 4.0–10.5)
nRBC: 0 % (ref 0.0–0.2)

## 2019-09-18 LAB — GLUCOSE, CAPILLARY
Glucose-Capillary: 102 mg/dL — ABNORMAL HIGH (ref 70–99)
Glucose-Capillary: 109 mg/dL — ABNORMAL HIGH (ref 70–99)
Glucose-Capillary: 119 mg/dL — ABNORMAL HIGH (ref 70–99)
Glucose-Capillary: 122 mg/dL — ABNORMAL HIGH (ref 70–99)
Glucose-Capillary: 124 mg/dL — ABNORMAL HIGH (ref 70–99)
Glucose-Capillary: 130 mg/dL — ABNORMAL HIGH (ref 70–99)
Glucose-Capillary: 98 mg/dL (ref 70–99)

## 2019-09-18 LAB — COMPREHENSIVE METABOLIC PANEL
ALT: 96 U/L — ABNORMAL HIGH (ref 0–44)
AST: 50 U/L — ABNORMAL HIGH (ref 15–41)
Albumin: 2.4 g/dL — ABNORMAL LOW (ref 3.5–5.0)
Alkaline Phosphatase: 102 U/L (ref 38–126)
Anion gap: 11 (ref 5–15)
BUN: 24 mg/dL — ABNORMAL HIGH (ref 8–23)
CO2: 20 mmol/L — ABNORMAL LOW (ref 22–32)
Calcium: 8.5 mg/dL — ABNORMAL LOW (ref 8.9–10.3)
Chloride: 104 mmol/L (ref 98–111)
Creatinine, Ser: 0.93 mg/dL (ref 0.61–1.24)
GFR calc Af Amer: >60 mL/min (ref >60)
GFR calc non Af Amer: >60 mL/min (ref >60)
Glucose, Bld: 121 mg/dL — ABNORMAL HIGH (ref 70–99)
Potassium: 4.1 mmol/L (ref 3.5–5.1)
Sodium: 135 mmol/L (ref 135–145)
Total Bilirubin: 1 mg/dL (ref 0.3–1.2)
Total Protein: 5.4 g/dL — ABNORMAL LOW (ref 6.5–8.1)

## 2019-09-18 LAB — PROTIME-INR
INR: 1.2 (ref 0.8–1.2)
Prothrombin Time: 15.1 seconds (ref 11.4–15.2)

## 2019-09-18 MED ORDER — SERTRALINE HCL 25 MG PO TABS
25.0000 mg | ORAL_TABLET | Freq: Every day | ORAL | Status: DC
  Administered 2019-09-18 – 2019-09-30 (×12): 25 mg via ORAL
  Filled 2019-09-18 (×13): qty 1

## 2019-09-18 MED ORDER — MIDAZOLAM HCL 2 MG/2ML IJ SOLN
INTRAMUSCULAR | Status: AC
  Filled 2019-09-18: qty 4

## 2019-09-18 MED ORDER — FENTANYL CITRATE (PF) 100 MCG/2ML IJ SOLN
25.0000 ug | Freq: Once | INTRAMUSCULAR | Status: AC
  Administered 2019-09-18: 25 ug via INTRAVENOUS
  Filled 2019-09-18: qty 2

## 2019-09-18 MED ORDER — LIDOCAINE-EPINEPHRINE (PF) 1 %-1:200000 IJ SOLN
INTRAMUSCULAR | Status: AC | PRN
  Administered 2019-09-18: 10 mL

## 2019-09-18 MED ORDER — PANTOPRAZOLE SODIUM 40 MG PO TBEC
40.0000 mg | DELAYED_RELEASE_TABLET | Freq: Two times a day (BID) | ORAL | Status: DC
  Administered 2019-09-18 – 2019-09-30 (×21): 40 mg via ORAL
  Filled 2019-09-18 (×25): qty 1

## 2019-09-18 MED ORDER — ASPIRIN 81 MG PO CHEW
81.0000 mg | CHEWABLE_TABLET | Freq: Every day | ORAL | Status: DC
  Administered 2019-09-18 – 2019-09-30 (×11): 81 mg via ORAL
  Filled 2019-09-18 (×13): qty 1

## 2019-09-18 MED ORDER — IOHEXOL 300 MG/ML  SOLN
50.0000 mL | Freq: Once | INTRAMUSCULAR | Status: AC | PRN
  Administered 2019-09-18: 10 mL

## 2019-09-18 MED ORDER — OMEGA-3-ACID ETHYL ESTERS 1 G PO CAPS
1.0000 g | ORAL_CAPSULE | Freq: Every day | ORAL | Status: DC
  Administered 2019-09-18 – 2019-09-30 (×9): 1 g via ORAL
  Filled 2019-09-18 (×13): qty 1

## 2019-09-18 MED ORDER — HYDROCORTISONE NA SUCCINATE PF 100 MG IJ SOLR
50.0000 mg | Freq: Two times a day (BID) | INTRAMUSCULAR | Status: DC
  Administered 2019-09-18 – 2019-09-19 (×3): 50 mg via INTRAVENOUS
  Filled 2019-09-18 (×3): qty 2

## 2019-09-18 MED ORDER — FENTANYL CITRATE (PF) 100 MCG/2ML IJ SOLN
INTRAMUSCULAR | Status: AC
  Filled 2019-09-18: qty 2

## 2019-09-18 MED ORDER — FINASTERIDE 5 MG PO TABS
5.0000 mg | ORAL_TABLET | Freq: Every evening | ORAL | Status: DC
  Administered 2019-09-19 – 2019-09-29 (×10): 5 mg via ORAL
  Filled 2019-09-18 (×11): qty 1

## 2019-09-18 MED ORDER — DILTIAZEM HCL 25 MG/5ML IV SOLN
5.0000 mg | Freq: Once | INTRAVENOUS | Status: AC
  Administered 2019-09-18: 5 mg via INTRAVENOUS
  Filled 2019-09-18: qty 5

## 2019-09-18 MED ORDER — FENTANYL CITRATE (PF) 100 MCG/2ML IJ SOLN
INTRAMUSCULAR | Status: AC | PRN
  Administered 2019-09-18: 50 ug via INTRAVENOUS

## 2019-09-18 MED ORDER — MORPHINE SULFATE (PF) 2 MG/ML IV SOLN
1.0000 mg | INTRAVENOUS | Status: DC | PRN
  Administered 2019-09-18 – 2019-09-27 (×6): 1 mg via INTRAVENOUS
  Filled 2019-09-18 (×7): qty 1

## 2019-09-18 MED ORDER — ATORVASTATIN CALCIUM 40 MG PO TABS
40.0000 mg | ORAL_TABLET | Freq: Every day | ORAL | Status: DC
  Administered 2019-09-18 – 2019-09-30 (×11): 40 mg via ORAL
  Filled 2019-09-18 (×13): qty 1

## 2019-09-18 MED ORDER — SODIUM CHLORIDE 0.9% FLUSH
5.0000 mL | Freq: Three times a day (TID) | INTRAVENOUS | Status: DC
  Administered 2019-09-18 – 2019-09-30 (×30): 5 mL

## 2019-09-18 MED ORDER — ENOXAPARIN SODIUM 40 MG/0.4ML ~~LOC~~ SOLN
40.0000 mg | Freq: Every day | SUBCUTANEOUS | Status: DC
  Administered 2019-09-19 – 2019-09-30 (×12): 40 mg via SUBCUTANEOUS
  Filled 2019-09-18 (×11): qty 0.4

## 2019-09-18 MED ORDER — LIDOCAINE HCL 1 % IJ SOLN
INTRAMUSCULAR | Status: AC
  Filled 2019-09-18: qty 20

## 2019-09-18 MED ORDER — DILTIAZEM HCL-DEXTROSE 125-5 MG/125ML-% IV SOLN (PREMIX)
5.0000 mg/h | INTRAVENOUS | Status: DC
  Administered 2019-09-18: 5 mg/h via INTRAVENOUS
  Filled 2019-09-18 (×2): qty 125

## 2019-09-18 MED ORDER — DILTIAZEM LOAD VIA INFUSION
5.0000 mg | Freq: Once | INTRAVENOUS | Status: AC
  Administered 2019-09-18: 5 mg via INTRAVENOUS
  Filled 2019-09-18: qty 5

## 2019-09-18 MED ORDER — MIDAZOLAM HCL 2 MG/2ML IJ SOLN
INTRAMUSCULAR | Status: AC | PRN
  Administered 2019-09-18: 1 mg via INTRAVENOUS

## 2019-09-18 MED ORDER — MORPHINE SULFATE (PF) 2 MG/ML IV SOLN
1.0000 mg | Freq: Once | INTRAVENOUS | Status: AC
  Administered 2019-09-18: 1 mg via INTRAVENOUS

## 2019-09-18 MED ORDER — DOCUSATE SODIUM 50 MG PO CAPS
250.0000 mg | ORAL_CAPSULE | Freq: Every day | ORAL | Status: DC
  Administered 2019-09-18 – 2019-09-30 (×9): 250 mg via ORAL
  Filled 2019-09-18 (×13): qty 1

## 2019-09-18 NOTE — Progress Notes (Signed)
Pt came back from IR in [facility]. Dr. Grandville Silos made aware and orders were received for a x1 dose of 5mg  of cardizem followed by the initiation of a cardizem gtt. Pt also much more agitated and confused following the drain placement. Order also received for morphine prn.

## 2019-09-18 NOTE — Consult Note (Signed)
Reason for Consult: Gallstones elevated liver tests Referring Physician: Hospital team  Derrick [name] is an 85 y.o. male.  HPI: Patient seen and examined in office computer chart and hospital computer chart reviewed and he has not been in our office since 2011 where he had a colonoscopy for polyps and he was visited yesterday but in nuclear medicine and today he is seen and denies any pain or nausea or vomiting and all he wants is a Coke but has no new complaints and his work-up was reviewed  Past Medical History:  Diagnosis Date  . Anemia   . Anxiety   . Arthritis, rheumatoid ([place])   . BPH (benign prostatic hyperplasia)   . CAD (coronary artery disease)   . CKD (chronic kidney disease)   . Depression   . Diverticulosis   . Elevated PSA   . Gangrene ([place])    of toe right foot  . GERD (gastroesophageal reflux disease)   . Herpes simplex labialis   . High cholesterol   . History of colonic polyps   . Hypertension   . Insomnia   . Lacunar infarction ([place])   . Memory loss   . Obesity   . Panic attacks   . Prostate cancer ([place]) YRS AGO  . Renal cell cancer ([place])    s/p nephrectomy in 2000  . Seropositive rheumatoid arthritis ([place])   . Spermatocele   . Tinnitus   . Tremor of both hands     Past Surgical History:  Procedure Laterality Date  . AMPUTATION TOE Right 06/13/2017   Procedure: AMPUTATION TOE 1st and 2nd;  Surgeon: [name], DPM;  Location: [place];  Service: Podiatry;  Laterality: Right;  . AMPUTATION TOE     RIGHT FOOT ALL TOES GONE  . BACK SURGERY  YRS AGO   LOWER  . COLONOSCOPY    . GRAFT APPLICATION Right 0000000   Procedure: EPIFIX GRAFT APPLICATION OF RIGHT FOOT;  Surgeon: [name], DPM;  Location: [facility];  Service: Podiatry;  Laterality: Right;  . I&D EXTREMITY Right 09/13/2017   Procedure: IRRIGATION AND DEBRIDEMENT AND BONE BIOPSY;  Surgeon: [name], DPM;  Location: [facility];  Service: Podiatry;  Laterality: Right;  . INCISION AND  DRAINAGE OF WOUND Right 12/18/2017   Procedure: IRRIGATION AND DEBRIDEMENT WOUND ON RIGHT FOOT;  Surgeon: [name], DPM;  Location: [facility];  Service: Podiatry;  Laterality: Right;  . IR ANGIOGRAM EXTREMITY RIGHT  05/30/2017  . IR ANGIOGRAM EXTREMITY RIGHT  08/08/2017  . IR ANGIOGRAM SELECTIVE EACH ADDITIONAL VESSEL  08/08/2017  . IR ANGIOGRAM SELECTIVE EACH ADDITIONAL VESSEL  08/08/2017  . IR RADIOLOGIST EVAL & MGMT  05/16/2017  . IR TIB-PERO ART PTA MOD SED  05/30/2017  . IR TIB-PERO ART PTA MOD SED  08/08/2017  . IR TIB-PERO ART UNI PTA EA ADD VESSEL MOD SED  05/30/2017  . IR TIB-PERO ART UNI PTA EA ADD VESSEL MOD SED  08/08/2017  . IR US GUIDE VASC ACCESS RIGHT  05/30/2017  . IR US GUIDE VASC ACCESS RIGHT  08/08/2017  . LEFT HEART CATHETERIZATION WITH CORONARY ANGIOGRAM N/A 11/19/2011   Procedure: LEFT HEART CATHETERIZATION WITH CORONARY ANGIOGRAM;  Surgeon: [name], MD;  Location: [facility] CATH LAB;  Service: Cardiovascular;  Laterality: N/A;  . LUMBAR LAMINECTOMY/DECOMPRESSION MICRODISCECTOMY N/A 11/26/2018   Procedure: Lumbar central decompression L2-3, L3-4;  Surgeon: [name], MD;  Location: [facility];  Service: Orthopedics;  Laterality: N/A;  159min  .  NEPHRECTOMY  10/1998  . TRANSMETATARSAL AMPUTATION Right 08/14/2017   Procedure: TRANSMETATARSAL AMPUTATION;  Surgeon: [name], DPM;  Location: [facility];  Service: Podiatry;  Laterality: Right;    Family History  Problem Relation Age of Onset  . Heart attack Father 52  . Stroke Father   . Kidney failure Sister        Bright's disease   . Cancer Paternal Aunt        d. 47s; NOS cancer of leg  . Kidney disease Sister        d. 52; "Bright's disease" - possible kidney cancer?  . Cancer Sister        NOS cancer; d. 23s; "sick in the hospital"  . Heart attack Son 85  . Leukemia Son 72  . COPD Son   . Breast cancer Daughter 43    Social History:  reports that he quit smoking about 40 years ago. His smoking use included  cigarettes. He has a 29.00 pack-year smoking history. He has never used smokeless tobacco. He reports that he does not drink alcohol or use drugs.  Allergies:  Allergies  Allergen Reactions  . Effexor [Venlafaxine] Other (See Comments)    Sexual side affects  . Lisinopril Cough  . Paxil [Paroxetine] Other (See Comments)    Sexual side affects  . Viagra [Sildenafil] Other (See Comments)    Chest discomfort  . Wellbutrin [Bupropion] Itching    Medications: I have reviewed the patient's current medications.  Results for orders placed or performed during the hospital encounter of 09/16/19 (from the past 48 hour(s))  CBC with Differential     Status: Abnormal   Collection Time: 09/16/19 11:13 AM  Result Value Ref Range   WBC 17.6 (H) 4.0 - 10.5 K/uL   RBC 4.44 4.22 - 5.81 MIL/uL   Hemoglobin 11.3 (L) 13.0 - 17.0 g/dL   HCT 36.6 (L) 39.0 - 52.0 %   MCV 82.4 80.0 - 100.0 fL   MCH 25.5 (L) 26.0 - 34.0 pg   MCHC 30.9 30.0 - 36.0 g/dL   RDW 15.7 (H) 11.5 - 15.5 %   Platelets 277 150 - 400 K/uL   nRBC 0.0 0.0 - 0.2 %   Neutrophils Relative % 85 %   Neutro Abs 15.0 (H) 1.7 - 7.7 K/uL   Lymphocytes Relative 5 %   Lymphs Abs 0.9 0.7 - 4.0 K/uL   Monocytes Relative 9 %   Monocytes Absolute 1.5 (H) 0.1 - 1.0 K/uL   Eosinophils Relative 0 %   Eosinophils Absolute 0.0 0.0 - 0.5 K/uL   Basophils Relative 0 %   Basophils Absolute 0.0 0.0 - 0.1 K/uL   Immature Granulocytes 1 %   Abs Immature Granulocytes 0.11 (H) 0.00 - 0.07 K/uL    Comment: Performed at [facility], [address] [place], [place] [address]  Comprehensive metabolic panel     Status: Abnormal   Collection Time: 09/16/19 11:13 AM  Result Value Ref Range   Sodium 132 (L) 135 - 145 mmol/L   Potassium 4.2 3.5 - 5.1 mmol/L   Chloride 101 98 - 111 mmol/L   CO2 22 22 - 32 mmol/L   Glucose, Bld 184 (H) 70 - 99 mg/dL   BUN 15 8 - 23 mg/dL   Creatinine, Ser 1.06 0.61 - 1.24 mg/dL   Calcium 9.2 8.9 - 10.3 mg/dL    Total Protein 7.0 6.5 - 8.1 g/dL   Albumin 3.6 3.5 -  5.0 g/dL   AST 118 (H) 15 - 41 U/L   ALT 159 (H) 0 - 44 U/L   Alkaline Phosphatase 112 38 - 126 U/L   Total Bilirubin 1.1 0.3 - 1.2 mg/dL   GFR calc non Af Amer >60 >60 mL/min   GFR calc Af Amer >60 >60 mL/min   Anion gap 9 5 - 15    Comment: Performed at [facility], [address] [place], [place] [address]  Lipase, blood     Status: None   Collection Time: 09/16/19 11:13 AM  Result Value Ref Range   Lipase 24 11 - 51 U/L    Comment: Performed at [facility], [address] [place], [place] [address]  Blood culture (routine x 2)     Status: Abnormal (Preliminary result)   Collection Time: 09/16/19 12:15 PM   Specimen: Right Antecubital; Blood  Result Value Ref Range   Specimen Description      RIGHT ANTECUBITAL Performed at [facility], [address] [place], [place] [address]    Special Requests      BOTTLES DRAWN AEROBIC AND ANAEROBIC Blood Culture adequate volume Performed at [facility], [address] [place], [place] [address]    Culture  Setup Time      IN BOTH AEROBIC AND ANAEROBIC BOTTLES GRAM NEGATIVE RODS CRITICAL VALUE NOTED.  VALUE IS CONSISTENT WITH PREVIOUSLY REPORTED AND CALLED VALUE.    Culture (A)     ESCHERICHIA COLI SUSCEPTIBILITIES TO FOLLOW Performed at [facility], [place] [address] [place], [place] [address]    Report Status PENDING   Blood culture (routine x 2)     Status: Abnormal (Preliminary result)   Collection Time: 09/16/19 12:30 PM   Specimen: BLOOD LEFT WRIST  Result Value Ref Range   Specimen Description      BLOOD LEFT WRIST Performed at [facility], [address] [place], [place] [address]    Special Requests      BOTTLES DRAWN AEROBIC AND ANAEROBIC Blood Culture adequate volume Performed at [facility], [address] [place], [place] [address]    Culture  Setup Time      IN BOTH AEROBIC AND  ANAEROBIC BOTTLES GRAM NEGATIVE RODS CRITICAL RESULT CALLED TO, READ BACK BY AND VERIFIED WITH: B GREEN PHARMD 09/17/19 0428 JDW    Culture (A)     ESCHERICHIA COLI SUSCEPTIBILITIES TO FOLLOW Performed at [facility], [place] [address] [place], [place] [address]    Report Status PENDING   Blood Culture ID Panel (Reflexed)     Status: Abnormal   Collection Time: 09/16/19 12:30 PM  Result Value Ref Range   Enterococcus species NOT DETECTED NOT DETECTED   Listeria monocytogenes NOT DETECTED NOT DETECTED   Staphylococcus species NOT DETECTED NOT DETECTED   Staphylococcus aureus (BCID) NOT DETECTED NOT DETECTED   Streptococcus species NOT DETECTED NOT DETECTED   Streptococcus agalactiae NOT DETECTED NOT DETECTED   Streptococcus pneumoniae NOT DETECTED NOT DETECTED   Streptococcus pyogenes NOT DETECTED NOT DETECTED   Acinetobacter baumannii NOT DETECTED NOT DETECTED   Enterobacteriaceae species DETECTED (A) NOT DETECTED    Comment: Enterobacteriaceae represent a large family of gram-negative bacteria, not a single organism. CRITICAL RESULT CALLED TO, READ BACK BY AND VERIFIED WITH: B GREEN PHARMD 09/17/19 0428 JDW    Enterobacter cloacae complex NOT DETECTED NOT DETECTED  Escherichia coli DETECTED (A) NOT DETECTED    Comment: CRITICAL RESULT CALLED TO, READ BACK BY AND VERIFIED WITH: B GREEN PHARMD 09/17/19 0428 JDW    Klebsiella oxytoca NOT DETECTED NOT DETECTED   Klebsiella pneumoniae NOT DETECTED NOT DETECTED   Proteus species NOT DETECTED NOT DETECTED   Serratia marcescens NOT DETECTED NOT DETECTED   Carbapenem resistance NOT DETECTED NOT DETECTED   Haemophilus influenzae NOT DETECTED NOT DETECTED   Neisseria meningitidis NOT DETECTED NOT DETECTED   Pseudomonas aeruginosa NOT DETECTED NOT DETECTED   Candida albicans NOT DETECTED NOT DETECTED   Candida glabrata NOT DETECTED NOT DETECTED   Candida krusei NOT DETECTED NOT DETECTED   Candida parapsilosis NOT DETECTED NOT  DETECTED   Candida tropicalis NOT DETECTED NOT DETECTED    Comment: Performed at [facility] [address], [place], [place] [address]  Lactic acid, plasma     Status: Abnormal   Collection Time: 09/16/19 12:39 PM  Result Value Ref Range   Lactic Acid, Venous 2.9 (HH) 0.5 - 1.9 mmol/L    Comment: CRITICAL RESULT CALLED TO, READ BACK BY AND VERIFIED WITH: CALLED TO M.SIMS AT 1313 ON [id number] BY [facility] Performed at [facility], [address] [place], [place] [address]   SARS CORONAVIRUS 2 (TAT 6-24 HRS) Nasopharyngeal Nasopharyngeal Swab     Status: None   Collection Time: 09/16/19 12:50 PM   Specimen: Nasopharyngeal Swab  Result Value Ref Range   SARS Coronavirus 2 NEGATIVE NEGATIVE    Comment: (NOTE) SARS-CoV-2 target nucleic acids are NOT DETECTED. The SARS-CoV-2 RNA is generally detectable in upper and lower respiratory specimens during the acute phase of infection. Negative results do not preclude SARS-CoV-2 infection, do not rule out co-infections with other pathogens, and should not be used as the sole basis for treatment or other patient management decisions. Negative results must be combined with clinical observations, patient history, and epidemiological information. The expected result is Negative. Fact Sheet for Patients: SugarRoll.be Fact Sheet for Healthcare Providers: https://www.woods-mathews.com/ This test is not yet approved or cleared by the [place] FDA and  has been authorized for detection and/or diagnosis of SARS-CoV-2 by FDA under an Emergency Use Authorization (EUA). This EUA will remain  in effect (meaning this test can be used) for the duration of the COVID-19 declaration under Section 56 4(b)(1) of the Act, 21 U.S.C. section 360bbb-3(b)(1), unless the authorization is terminated or revoked sooner. Performed at [facility], [place] [address] [place], [place] [address]   Lactic acid,  plasma     Status: None   Collection Time: 09/16/19  2:46 PM  Result Value Ref Range   Lactic Acid, Venous 1.9 0.5 - 1.9 mmol/L    Comment: Performed at [facility], [address] [place], [place] [address]  Urinalysis, Routine w reflex microscopic     Status: Abnormal   Collection Time: 09/16/19  2:55 PM  Result Value Ref Range   Color, Urine YELLOW YELLOW   APPearance CLEAR CLEAR   Specific Gravity, Urine 1.010 1.005 - 1.030   pH 7.0 5.0 - 8.0   Glucose, UA NEGATIVE NEGATIVE mg/dL   Hgb urine dipstick SMALL (A) NEGATIVE   Bilirubin Urine NEGATIVE NEGATIVE   Ketones, ur NEGATIVE NEGATIVE mg/dL   Protein, ur 100 (A) NEGATIVE mg/dL   Nitrite NEGATIVE NEGATIVE   Leukocytes,Ua NEGATIVE NEGATIVE    Comment: Performed at [facility], [address] [place], [place] [address]  Urinalysis, Microscopic (reflex)     Status: Abnormal   Collection Time: 09/16/19  2:55 PM  Result Value Ref Range   RBC / HPF 0-5 0 - 5 RBC/hpf   WBC, UA NONE SEEN 0 - 5 WBC/hpf   Bacteria, UA RARE (A) NONE SEEN   Squamous Epithelial / LPF 6-10 0 - 5    Comment: Performed at [facility], [address] [place], [place] [address]  MRSA PCR Screening     Status: None   Collection Time: 09/16/19 11:34 PM   Specimen: Nasal Mucosa; Nasopharyngeal  Result Value Ref Range   MRSA by PCR NEGATIVE NEGATIVE    Comment:        The GeneXpert MRSA Assay (FDA approved for NASAL specimens only), is one component of a comprehensive MRSA colonization surveillance program. It is not intended to diagnose MRSA infection nor to guide or monitor treatment for MRSA infections. Performed at [facility], [place] [address] [place], [place] [address]   Lactic acid, plasma     Status: None   Collection Time: 09/17/19 12:02 AM  Result Value Ref Range   Lactic Acid, Venous 1.5 0.5 - 1.9 mmol/L    Comment: Performed at [facility], [place] [address]  [place], [place] [address]  Hemoglobin A1c     Status: Abnormal   Collection Time: 09/17/19 12:30 AM  Result Value Ref Range   Hgb A1c MFr Bld 6.1 (H) 4.8 - 5.6 %    Comment: (NOTE) Pre diabetes:          5.7%-6.4% Diabetes:              >6.4% Glycemic control for   <7.0% adults with diabetes    Mean Plasma Glucose 128.37 mg/dL    Comment: Performed at [place] [address], [place], [place] [address]  Glucose, capillary     Status: Abnormal   Collection Time: 09/17/19  1:34 AM  Result Value Ref Range   Glucose-Capillary 116 (H) 70 - 99 mg/dL  CBC     Status: Abnormal   Collection Time: 09/17/19  2:33 AM  Result Value Ref Range   WBC 20.2 (H) 4.0 - 10.5 K/uL   RBC 4.04 (L) 4.22 - 5.81 MIL/uL   Hemoglobin 10.6 (L) 13.0 - 17.0 g/dL   HCT 35.1 (L) 39.0 - 52.0 %   MCV 86.9 80.0 - 100.0 fL   MCH 26.2 26.0 - 34.0 pg   MCHC 30.2 30.0 - 36.0 g/dL   RDW 15.9 (H) 11.5 - 15.5 %   Platelets 219 150 - 400 K/uL   nRBC 0.0 0.0 - 0.2 %    Comment: Performed at [facility], [place] [address], [place], [place] [address]  Comprehensive metabolic panel     Status: Abnormal   Collection Time: 09/17/19  2:33 AM  Result Value Ref Range   Sodium 135 135 - 145 mmol/L   Potassium 4.5 3.5 - 5.1 mmol/L   Chloride 103 98 - 111 mmol/L   CO2 21 (L) 22 - 32 mmol/L   Glucose, Bld 115 (H) 70 - 99 mg/dL   BUN 17 8 - 23 mg/dL   Creatinine, Ser 1.06 0.61 - 1.24 mg/dL   Calcium 9.0 8.9 - 10.3 mg/dL   Total Protein 6.6 6.5 - 8.1 g/dL   Albumin 3.1 (L) 3.5 - 5.0 g/dL   AST 111 (H) 15 - 41 U/L  ALT 167 (H) 0 - 44 U/L   Alkaline Phosphatase 133 (H) 38 - 126 U/L   Total Bilirubin 1.5 (H) 0.3 - 1.2 mg/dL   GFR calc non Af Amer >60 >60 mL/min   GFR calc Af Amer >60 >60 mL/min   Anion gap 11 5 - 15    Comment: Performed at [facility], [place] [address], [place], [place] [address]  Lactic acid, plasma     Status: None   Collection Time: 09/17/19  2:33 AM  Result  Value Ref Range   Lactic Acid, Venous 1.4 0.5 - 1.9 mmol/L    Comment: Performed at [facility], [place] [address], [place], [place] [address]  Hepatic function panel     Status: Abnormal   Collection Time: 09/17/19  2:33 AM  Result Value Ref Range   Total Protein 6.6 6.5 - 8.1 g/dL   Albumin 3.1 (L) 3.5 - 5.0 g/dL   AST 112 (H) 15 - 41 U/L   ALT 166 (H) 0 - 44 U/L   Alkaline Phosphatase 132 (H) 38 - 126 U/L   Total Bilirubin 1.4 (H) 0.3 - 1.2 mg/dL   Bilirubin, Direct 0.8 (H) 0.0 - 0.2 mg/dL   Indirect Bilirubin 0.6 0.3 - 0.9 mg/dL    Comment: Performed at [facility], [place] [address], [place], [place] [address]  Glucose, capillary     Status: Abnormal   Collection Time: 09/17/19  3:29 AM  Result Value Ref Range   Glucose-Capillary 110 (H) 70 - 99 mg/dL  Glucose, capillary     Status: Abnormal   Collection Time: 09/17/19  7:54 AM  Result Value Ref Range   Glucose-Capillary 115 (H) 70 - 99 mg/dL  Glucose, capillary     Status: Abnormal   Collection Time: 09/17/19  4:21 PM  Result Value Ref Range   Glucose-Capillary 118 (H) 70 - 99 mg/dL  Glucose, capillary     Status: Abnormal   Collection Time: 09/17/19  7:48 PM  Result Value Ref Range   Glucose-Capillary 130 (H) 70 - 99 mg/dL  Glucose, capillary     Status: Abnormal   Collection Time: 09/17/19 11:59 PM  Result Value Ref Range   Glucose-Capillary 102 (H) 70 - 99 mg/dL  CBC with Differential/Platelet     Status: Abnormal   Collection Time: 09/18/19  2:17 AM  Result Value Ref Range   WBC 16.1 (H) 4.0 - 10.5 K/uL   RBC 3.08 (L) 4.22 - 5.81 MIL/uL   Hemoglobin 8.0 (L) 13.0 - 17.0 g/dL   HCT 26.6 (L) 39.0 - 52.0 %   MCV 86.4 80.0 - 100.0 fL   MCH 26.0 26.0 - 34.0 pg   MCHC 30.1 30.0 - 36.0 g/dL   RDW 16.0 (H) 11.5 - 15.5 %   Platelets 176 150 - 400 K/uL   nRBC 0.0 0.0 - 0.2 %   Neutrophils Relative % 89 %   Neutro Abs 14.3 (H) 1.7 - 7.7 K/uL   Lymphocytes Relative 3 %   Lymphs Abs 0.5  (L) 0.7 - 4.0 K/uL   Monocytes Relative 7 %   Monocytes Absolute 1.1 (H) 0.1 - 1.0 K/uL   Eosinophils Relative 0 %   Eosinophils Absolute 0.0 0.0 - 0.5 K/uL   Basophils Relative 0 %   Basophils Absolute 0.0 0.0 - 0.1 K/uL   Immature Granulocytes 1 %   Abs Immature Granulocytes 0.15 (H) 0.00 - 0.07 K/uL    Comment:  Performed at [facility], [place] [address] [place], [place] [address]  Comprehensive metabolic panel     Status: Abnormal   Collection Time: 09/18/19  2:17 AM  Result Value Ref Range   Sodium 135 135 - 145 mmol/L   Potassium 4.1 3.5 - 5.1 mmol/L   Chloride 104 98 - 111 mmol/L   CO2 20 (L) 22 - 32 mmol/L   Glucose, Bld 121 (H) 70 - 99 mg/dL   BUN 24 (H) 8 - 23 mg/dL   Creatinine, Ser 0.93 0.61 - 1.24 mg/dL   Calcium 8.5 (L) 8.9 - 10.3 mg/dL   Total Protein 5.4 (L) 6.5 - 8.1 g/dL   Albumin 2.4 (L) 3.5 - 5.0 g/dL   AST 50 (H) 15 - 41 U/L   ALT 96 (H) 0 - 44 U/L   Alkaline Phosphatase 102 38 - 126 U/L   Total Bilirubin 1.0 0.3 - 1.2 mg/dL   GFR calc non Af Amer >60 >60 mL/min   GFR calc Af Amer >60 >60 mL/min   Anion gap 11 5 - 15    Comment: Performed at [facility], [place] [address], [place], [place] [address]  Glucose, capillary     Status: Abnormal   Collection Time: 09/18/19  3:58 AM  Result Value Ref Range   Glucose-Capillary 124 (H) 70 - 99 mg/dL  Glucose, capillary     Status: Abnormal   Collection Time: 09/18/19  8:07 AM  Result Value Ref Range   Glucose-Capillary 122 (H) 70 - 99 mg/dL   Comment 1 Notify RN    Comment 2 Document in Chart   Protime-INR     Status: None   Collection Time: 09/18/19  9:05 AM  Result Value Ref Range   Prothrombin Time 15.1 11.4 - 15.2 seconds   INR 1.2 0.8 - 1.2    Comment: (NOTE) INR goal varies based on device and disease states. Performed at [facility], [place] [address], [place], [place] [address]     CT HEAD WO CONTRAST  Result Date: 09/17/2019 CLINICAL DATA:   Encephalopathy. EXAM: CT HEAD WITHOUT CONTRAST TECHNIQUE: Contiguous axial images were obtained from the base of the skull through the vertex without intravenous contrast. COMPARISON:  October 02, 2012. FINDINGS: Brain: Mild diffuse cortical atrophy is noted. Mild chronic ischemic white matter disease is noted. No mass effect or midline shift is noted. Ventricular size is within normal limits. There is no evidence of mass lesion, hemorrhage or acute infarction. Vascular: No hyperdense vessel or unexpected calcification. Skull: Normal. Negative for fracture or focal lesion. Sinuses/Orbits: No acute finding. Other: None. IMPRESSION: Mild diffuse cortical atrophy. Mild chronic ischemic white matter disease. No acute intracranial abnormality seen. Electronically Signed   By: Marijo Conception M.D.   On: 09/17/2019 11:46   CT ABDOMEN PELVIS W CONTRAST  Result Date: 09/16/2019 CLINICAL DATA:  Acute abdominal pain, generalized. EXAM: CT ABDOMEN AND PELVIS WITH CONTRAST TECHNIQUE: Multidetector CT imaging of the abdomen and pelvis was performed using the standard protocol following bolus administration of intravenous contrast. CONTRAST:  163mL OMNIPAQUE IOHEXOL 300 MG/ML  SOLN COMPARISON:  10/03/2018 FINDINGS: Lower chest: Small subpleural lymph node in the right lower lobe stable since 2016. Signs of scarring and mild subpleural reticulation, unchanged. No signs of consolidation or evidence of pleural effusion. Hepatobiliary: Liver is unremarkable. There is moderate distension of the gallbladder with signs of gallbladder wall thickening and pericholecystic stranding. Wall thickness difficult to read on  CT but is much as 4 mm despite the distended appearance of the gallbladder. There is no intra or extrahepatic biliary ductal distension. Pancreas: Unremarkable. No pancreatic ductal dilatation or surrounding inflammatory changes. Spleen: Normal in size without focal abnormality. Adrenals/Urinary Tract: Signs of right  nephrectomy with stable small area of soft tissue nodularity along the ligated right renal artery measuring approximately 1.5 cm this has been present since 2007. Adrenal glands are normal. Small presumed cysts in the left kidney are unchanged. Stomach/Bowel: No signs of acute gastrointestinal process. Normal appendix. Vascular/Lymphatic: Calcified atherosclerotic changes of the abdominal aorta up to 3 x 2.7 cm in greatest axial dimension. No signs of adenopathy in the upper abdomen or retroperitoneum. No signs of pelvic adenopathy. Vascular disease extends into branch vessels throughout the abdomen and pelvis. Reproductive: Prostatomegaly. No sign of pelvic adenopathy. Other: No signs of free air. No abscess. Musculoskeletal: Spinal degenerative changes with signs of lower lumbar laminectomy. Grade 1 anterolisthesis of L5 on S1. Finding similar to prior study. Also mild anterolisthesis of L4 on L5. IMPRESSION: 1. CT findings of acute cholecystitis. 2. Signs of right nephrectomy with stable small area of soft tissue nodularity along the ligated right renal artery. Unchanged since 2007. 3. Aortic atherosclerosis. 4. Prostatomegaly. Aortic Atherosclerosis (ICD10-I70.0). Electronically Signed   By: Zetta Bills M.D.   On: 09/16/2019 12:39   NM Hepato W/EjeCT Fract  Result Date: 09/17/2019 CLINICAL DATA:  RIGHT upper quadrant pain with nausea and vomiting for 3 days EXAM: NUCLEAR MEDICINE HEPATOBILIARY IMAGING TECHNIQUE: Sequential images of the abdomen were obtained out to 60 minutes following intravenous administration of radiopharmaceutical. RADIOPHARMACEUTICALS:  5.35 mCi Tc-19m  Choletec IV Pharmaceutical: Morphine sulfate 3 mg IV COMPARISON:  Ultrasound abdomen 09/16/2019 FINDINGS: Normal tracer extraction from bloodstream indicating normal hepatocellular function. Normal excretion of tracer into biliary tree. Small bowel visualized at 14 minutes. At 1 hour, gallbladder had not visualized. Patient then  received morphine and imaging was continued for an additional 30 minutes. Gallbladder failed to visualize following morphine augmentation. Findings are consistent with cystic duct obstruction and acute cholecystitis. IMPRESSION: Patent CBD. Nonvisualization of the gallbladder despite morphine augmentation consistent with cystic duct obstruction and acute cholecystitis. Electronically Signed   By: Lavonia Dana M.D.   On: 09/17/2019 14:36   US Abdomen Limited  Result Date: 09/16/2019 CLINICAL DATA:  Abdominal pain. CT abdomen and pelvis today demonstrated findings worrisome for cholecystitis. EXAM: ULTRASOUND ABDOMEN LIMITED RIGHT UPPER QUADRANT COMPARISON:  CT abdomen and pelvis earlier today. FINDINGS: Gallbladder: No gallstones or pericholecystic fluid. Sonographer reports negative Murphy's sign. The gallbladder is mildly distended and there is minimal wall thickening at 0.4 cm. Common bile duct: Diameter: 0.8 cm Liver: No focal lesion. Echogenicity is increased. Portal vein is patent on color Doppler imaging with normal direction of blood flow towards the liver. Other: None. IMPRESSION: Negative for gallstones or acute cholecystitis. Fatty infiltration of the liver. Electronically Signed   By: Inge Rise M.D.   On: 09/16/2019 13:59   DG Chest Portable 1 View  Result Date: 09/16/2019 CLINICAL DATA:  Fever, dyspnea. EXAM: PORTABLE CHEST 1 VIEW COMPARISON:  Chest radiograph 12/12/2018 FINDINGS: The patient is significantly rotated to the right, limiting evaluation. Heart size within normal limits. No definite airspace consolidation within the lungs. No evidence of pleural effusion or pneumothorax. No acute bony abnormality. Overlying cardiac monitoring leads. IMPRESSION: Patient significantly rotated to the right, limiting evaluation. No definite airspace consolidation. Electronically Signed   By: Kellie Simmering DO  On: 09/16/2019 15:16    Review of Systems negative except above Blood pressure (!)  104/58, pulse (!) 104, temperature 98.1 F (36.7 C), temperature source Oral, resp. rate 18, height 6' (1.829 m), weight 80.8 kg, SpO2 93 %. Physical Exam vital signs stable afebrile no acute distress abdomen is soft nontender good bowel sounds CT and HIDA scan pertinent for acute cholecystitis LFTs decreased white count decreased decreased hemoglobin as well  Assessment/Plan: Acute cholecystitis Plan: Since patient seems to be much better to me one may asked surgery to reevaluate cholecystectomy now versus proceeding with IR and we will be on standby for an Intra-Op cholangiogram or if IR is able to inject the CBD and please call my partner Dr. Michail Sermon this weekend if any GI question or problems otherwise consider MRCP if needed going forward  [facility] E 09/18/2019, 9:48 AM

## 2019-09-18 NOTE — Progress Notes (Addendum)
Subjective: No complaints. Pleasant and alert but not oriented to place, time, or situation  Objective: Vital signs in last 24 hours: Temp:  [97.5 F (36.4 C)-98.2 F (36.8 C)] 98.2 F (36.8 C) (12/11 0435) Pulse Rate:  [72-116] 104 (12/11 0600) Resp:  [10-23] 18 (12/11 0600) BP: (103-164)/(48-130) 104/58 (12/11 0600) SpO2:  [93 %-100 %] 93 % (12/11 0600)    Intake/Output from previous day: 12/10 0701 - 12/11 0700 In: 4233.1 [I.V.:4066.4; IV Piggyback:166.8] Out: 400 [Urine:400] Intake/Output this shift: Total I/O In: -  Out: 300 [Urine:300]  General appearance: alert and cooperative Resp: clear to auscultation bilaterally Cardio: regular rate and rhythm GI: soft, nontender  Lab Results:  Recent Labs    09/17/19 0233 09/18/19 0217  WBC 20.2* 16.1*  HGB 10.6* 8.0*  HCT 35.1* 26.6*  PLT 219 176   BMET Recent Labs    09/17/19 0233 09/18/19 0217  NA 135 135  K 4.5 4.1  CL 103 104  CO2 21* 20*  GLUCOSE 115* 121*  BUN 17 24*  CREATININE 1.06 0.93  CALCIUM 9.0 8.5*   PT/INR No results for input(s): LABPROT, INR in the last 72 hours. ABG No results for input(s): PHART, HCO3 in the last 72 hours.  Invalid input(s): PCO2, PO2  Studies/Results: CT HEAD WO CONTRAST  Result Date: 09/17/2019 CLINICAL DATA:  Encephalopathy. EXAM: CT HEAD WITHOUT CONTRAST TECHNIQUE: Contiguous axial images were obtained from the base of the skull through the vertex without intravenous contrast. COMPARISON:  October 02, 2012. FINDINGS: Brain: Mild diffuse cortical atrophy is noted. Mild chronic ischemic white matter disease is noted. No mass effect or midline shift is noted. Ventricular size is within normal limits. There is no evidence of mass lesion, hemorrhage or acute infarction. Vascular: No hyperdense vessel or unexpected calcification. Skull: Normal. Negative for fracture or focal lesion. Sinuses/Orbits: No acute finding. Other: None. IMPRESSION: Mild diffuse cortical  atrophy. Mild chronic ischemic white matter disease. No acute intracranial abnormality seen. Electronically Signed   By: Marijo Conception M.D.   On: 09/17/2019 11:46   CT ABDOMEN PELVIS W CONTRAST  Result Date: 09/16/2019 CLINICAL DATA:  Acute abdominal pain, generalized. EXAM: CT ABDOMEN AND PELVIS WITH CONTRAST TECHNIQUE: Multidetector CT imaging of the abdomen and pelvis was performed using the standard protocol following bolus administration of intravenous contrast. CONTRAST:  141mL OMNIPAQUE IOHEXOL 300 MG/ML  SOLN COMPARISON:  10/03/2018 FINDINGS: Lower chest: Small subpleural lymph node in the right lower lobe stable since 2016. Signs of scarring and mild subpleural reticulation, unchanged. No signs of consolidation or evidence of pleural effusion. Hepatobiliary: Liver is unremarkable. There is moderate distension of the gallbladder with signs of gallbladder wall thickening and pericholecystic stranding. Wall thickness difficult to read on CT but is much as 4 mm despite the distended appearance of the gallbladder. There is no intra or extrahepatic biliary ductal distension. Pancreas: Unremarkable. No pancreatic ductal dilatation or surrounding inflammatory changes. Spleen: Normal in size without focal abnormality. Adrenals/Urinary Tract: Signs of right nephrectomy with stable small area of soft tissue nodularity along the ligated right renal artery measuring approximately 1.5 cm this has been present since 2007. Adrenal glands are normal. Small presumed cysts in the left kidney are unchanged. Stomach/Bowel: No signs of acute gastrointestinal process. Normal appendix. Vascular/Lymphatic: Calcified atherosclerotic changes of the abdominal aorta up to 3 x 2.7 cm in greatest axial dimension. No signs of adenopathy in the upper abdomen or retroperitoneum. No signs of pelvic adenopathy. Vascular disease  extends into branch vessels throughout the abdomen and pelvis. Reproductive: Prostatomegaly. No sign of pelvic  adenopathy. Other: No signs of free air. No abscess. Musculoskeletal: Spinal degenerative changes with signs of lower lumbar laminectomy. Grade 1 anterolisthesis of L5 on S1. Finding similar to prior study. Also mild anterolisthesis of L4 on L5. IMPRESSION: 1. CT findings of acute cholecystitis. 2. Signs of right nephrectomy with stable small area of soft tissue nodularity along the ligated right renal artery. Unchanged since 2007. 3. Aortic atherosclerosis. 4. Prostatomegaly. Aortic Atherosclerosis (ICD10-I70.0). Electronically Signed   By: Zetta Bills M.D.   On: 09/16/2019 12:39   NM Hepato W/EjeCT Fract  Result Date: 09/17/2019 CLINICAL DATA:  RIGHT upper quadrant pain with nausea and vomiting for 3 days EXAM: NUCLEAR MEDICINE HEPATOBILIARY IMAGING TECHNIQUE: Sequential images of the abdomen were obtained out to 60 minutes following intravenous administration of radiopharmaceutical. RADIOPHARMACEUTICALS:  5.35 mCi Tc-36m  Choletec IV Pharmaceutical: Morphine sulfate 3 mg IV COMPARISON:  Ultrasound abdomen 09/16/2019 FINDINGS: Normal tracer extraction from bloodstream indicating normal hepatocellular function. Normal excretion of tracer into biliary tree. Small bowel visualized at 14 minutes. At 1 hour, gallbladder had not visualized. Patient then received morphine and imaging was continued for an additional 30 minutes. Gallbladder failed to visualize following morphine augmentation. Findings are consistent with cystic duct obstruction and acute cholecystitis. IMPRESSION: Patent CBD. Nonvisualization of the gallbladder despite morphine augmentation consistent with cystic duct obstruction and acute cholecystitis. Electronically Signed   By: Lavonia Dana M.D.   On: 09/17/2019 14:36   US Abdomen Limited  Result Date: 09/16/2019 CLINICAL DATA:  Abdominal pain. CT abdomen and pelvis today demonstrated findings worrisome for cholecystitis. EXAM: ULTRASOUND ABDOMEN LIMITED RIGHT UPPER QUADRANT COMPARISON:  CT  abdomen and pelvis earlier today. FINDINGS: Gallbladder: No gallstones or pericholecystic fluid. Sonographer reports negative Murphy's sign. The gallbladder is mildly distended and there is minimal wall thickening at 0.4 cm. Common bile duct: Diameter: 0.8 cm Liver: No focal lesion. Echogenicity is increased. Portal vein is patent on color Doppler imaging with normal direction of blood flow towards the liver. Other: None. IMPRESSION: Negative for gallstones or acute cholecystitis. Fatty infiltration of the liver. Electronically Signed   By: Inge Rise M.D.   On: 09/16/2019 13:59   DG Chest Portable 1 View  Result Date: 09/16/2019 CLINICAL DATA:  Fever, dyspnea. EXAM: PORTABLE CHEST 1 VIEW COMPARISON:  Chest radiograph 12/12/2018 FINDINGS: The patient is significantly rotated to the right, limiting evaluation. Heart size within normal limits. No definite airspace consolidation within the lungs. No evidence of pleural effusion or pneumothorax. No acute bony abnormality. Overlying cardiac monitoring leads. IMPRESSION: Patient significantly rotated to the right, limiting evaluation. No definite airspace consolidation. Electronically Signed   By: Kellie Simmering DO   On: 09/16/2019 15:16    Anti-infectives: Anti-infectives (From admission, onward)   Start     Dose/Rate Route Frequency Ordered Stop   09/17/19 1000  hydroxychloroquine (PLAQUENIL) tablet 200 mg  Status:  Discontinued     200 mg Oral Daily 09/16/19 2340 09/16/19 2352   09/17/19 0000  piperacillin-tazobactam (ZOSYN) IVPB 3.375 g     3.375 g 12.5 mL/hr over 240 Minutes Intravenous Every 8 hours 09/16/19 2349     09/16/19 1230  piperacillin-tazobactam (ZOSYN) IVPB 3.375 g     3.375 g 100 mL/hr over 30 Minutes Intravenous  Once 09/16/19 1227 09/16/19 1347      Assessment/Plan: s/p  Continue bowel rest . Continue abx Atrial fibrillation Coronary artery disease Peripheral  artery disease Rheumatoid arthritis History of CVA History of  renal cell carcinoma, status post nephrectomy History of prostate cancer Hypertension Chronic kidney disease  Acute cholecystitis with bacteremia and sepsis The patient has a positive HIDA scan consistent with acute cholecystitis along with multiple gram-negative bacteria in his blood that is almost certainly from his cholecystitis.  Given the patient's current encephalopathy, frailty, multiple medical problems, bacteremia and sepsis, the patient is high risk for surgical intervention at this time.  We will consult interventional radiology for placement of a percutaneous cholecystostomy tube.  I have discussed this with the patient's wife as well as the patient's son via phone.  Both understand the situation and why surgery is not recommended at this time.  They also both understand the procedure of cholecystostomy tube placement and that this will likely be in for 6 to 8 weeks.  He will then need a cardiac evaluation for clearance and to determine if he is a surgical candidate at that time to undergo cholecystectomy.  The son seems to understand this better than the wife, but she does also agree to move forward with this plan.  If his sepsis seems to be getting worse and the perc drain needs to go in today then could consider hanging platelets at time of procedure. Otherwise defer holding time of plavix to cardiology and medicine.if procedure is to be done today then lovenox will need to be held as well.   FEN -NPO VTE -Plavix on hold, Lovenox ID -[id number]  LOS: 2 days    Derrick [name] 09/18/2019

## 2019-09-18 NOTE — Procedures (Signed)
  Procedure: IR/US perc cholecystostomy cath placement 31f EBL:   minimal Complications:  none immediate  See full dictation in [facility].  Dillard Cannon MD Main # [phone] Pager  [phone]

## 2019-09-18 NOTE — Progress Notes (Signed)
MEDICATION-RELATED CONSULT NOTE   IR Procedure Consult - Anticoagulant/Antiplatelet PTA/Inpatient Med List Review by Pharmacist    Procedure: perc cholecystostomy cath placement    Completed: [id number] 09/18/19  Post-Procedural bleeding risk per IR MD assessment: HIGH  Antithrombotic medications on inpatient or PTA profile prior to procedure:  Lovenox 40 mg SQ daily;  ASA 81 mg not held    Recommended restart time per IR Post-Procedure Guidelines:  POD1 AM   Other considerations:  none   Plan:     Resume Lovenox 40 mg SQ daily 12/12 AM  Reuel Boom, PharmD, BCPS [phone] 09/18/2019, 5:07 PM

## 2019-09-18 NOTE — Progress Notes (Signed)
PROGRESS NOTE    Derrick [name]  [id number] DOB: 08/28/1932 DOA: 09/16/2019 PCP: Derrick Pretty, MD    Brief Narrative:  HPI per Dr. Satira [name] is a 85 y.o. male with medical history significant of RA on prednisone and hydroxychloroquine, CAD and PAD, HTN.  Patient presented to [facility] ED with c/o periumbilical to lower abd pain, onset 3 days ago.  Associated with N/V.  No change in BM.  No urinary complaints.  Went to PCP today and referred to ED.   ED Course: In the ED TTP periumbilically and suprapubically without rebound or guarding.  He is septic with WBC 17k, Tm 101.6, HR 134.  Not hypotensive yet though.  Given 2L bolus.  LFTs noted to be significantly elevated with AST 118 and ALT 159.  COVID RT-PCR is negative.  Initial lactate 2.9, improves to 1.9 after IVF.  CT abd / pelvis: 1) lung bases are clear 2) findings worrisome for acute cholecystitis  [place] abd: neg for acute cholecystitis.  gen surg consulted: put on ABx, admit, get HIDA, and consult them if positive.  Although EDP note nor transfer note makes any mention of encephalopathy while in ED, by the time he arrives to the SDU here at [facility], he is encephalopathic.   Assessment & Plan:   Principal Problem:   Sepsis ([name]) Active Problems:   E coli bacteremia   Acute cholecystitis   HTN (hypertension)   GERD (gastroesophageal reflux disease)   CAD (coronary artery disease)   Osteomyelitis (HCC)   Acute metabolic encephalopathy   A-fib (HCC)   Transaminitis   Nausea and vomiting   Rheumatoid arthritis ([name])  #1 sepsis secondary to cholecystitis and E. coli bacteremia.  Patient presented with periumbilical to lower abdominal pain, fevers, nausea and vomiting.  Abdominal ultrasound done in the ED negative for acute cholecystitis.  CT abdomen and pelvis done worrisome for acute cholecystitis.  Patient noted to have a transaminitis, rapidly progressive encephalopathy on admission, with positive  blood cultures of E. coli.  Patient on IV fluids.  HIDA scan ordered and consistent with acute cholecystitis with cystic duct obstruction.  General surgical consultation obtained and general surgery recommending cholecystostomy tube placement at this time which would likely be in for about 6 to 8 weeks.  Per general surgery will likely need cardiac evaluation for preop clearance to determine whether patient is surgical candidate at that time to undergo cholecystectomy.  Continue empiric IV Zosyn.  Continue IV fluids.  Decrease stress dose steroids to IV Solu-Cortef 50 mg IV every 12 hours.  Patient noted to be on Plaquenil and chronic steroids for RA and steroid started due to worsening encephalopathy.  GI review chart and due to HIDA scan results at this time is deferring to general surgery recommending that if patient does not improve with percutaneous drain or if LFTs continue to rise patient will need a MRCP and GI to be called back to reconsult.  General surgery following and appreciate input and recommendations.   2.  Rheumatoid arthritis Plaquenil and prednisone on hold.  Patient on IV stress dose steroids.  Likely resume Plaquenil once acute infection has resolved.  3.  History of atrial fibrillation Currently rate controlled.  Patient on chronic Plavix for coronary artery disease/PAD but not on chronic blood thinners.  Plavix on hold pending probable cholecystectomy tube placement.   4.  Acute metabolic encephalopathy Likely secondary to problem #1.  Patient also with a E. coli bacteremia.  Head CT with  no acute abnormalities noted.  Continue empiric IV Zosyn.  Supportive care.   5.  Coronary artery disease/PAD Stable.  Hold Plavix for now pending cholecystostomy tube placement per IR which has been ordered per general surgery.  General surgery to advise when Plavix may be resumed.  Resume home regimen of aspirin, Lipitor, fish oil.  Will likely need outpatient evaluation by cardiology post  discharge for preop clearance for possible cholecystectomy in about 6 to 8 weeks.  General surgery to advise when Plavix may be resumed.  6.  Hypertension Blood pressure borderline.  Continue to hold antihypertensive blood pressure medications.  Gentle hydration.  Patient on stress dose IV Solu-Cortef.  7.  Gastroesophageal reflux disease Resume home regimen PPI.   DVT prophylaxis: Lovenox Code Status: Full Family Communication: Updated patient.  No family at bedside. Disposition Plan: Remain in stepdown unit.  To be determined.   Consultants:   GI: Dr. Watt Climes--- reviewed chart.  General surgery: Derrick Danker, PA/Dr. Marlou [name] 09/17/2019  Procedures:   CT abdomen and pelvis 09/16/2019  Right upper quadrant ultrasound 09/16/2019  Chest x-ray 09/16/2019  HIDA scan 09/17/2019  CT head 09/17/2019  Antimicrobials:   IV Zosyn 09/16/2019   Subjective: Patient laying in bed in mittens.  Patient pleasantly confused.  Denies any chest pain or shortness of breath.  Denies any abdominal pain.   Objective: Vitals:   09/18/19 0200 09/18/19 0400 09/18/19 0435 09/18/19 0600  BP: (!) 103/59 (!) 153/95  (!) 104/58  Pulse: 75 72  (!) 104  Resp: 12 12  18   Temp:   98.2 F (36.8 C)   TempSrc:   Axillary   SpO2: 94% 95%  93%  Weight:      Height:        Intake/Output Summary (Last 24 hours) at 09/18/2019 0908 Last data filed at 09/18/2019 0745 Gross per 24 hour  Intake 2317.56 ml  Output 700 ml  Net 1617.56 ml   Filed Weights   09/16/19 1110  Weight: 80.8 kg    Examination:  General exam: Pleasantly confused. Dry mucous membranes. Respiratory system: CTAB anterior lung fields.  No wheezes, no crackles, no rhonchi.  Normal respiratory effort.   Cardiovascular system: Irregularly irregular.  No JVD.  No lower extremity edema.   Gastrointestinal system: Abdomen is nontender, nondistended, soft, positive bowel sounds.  No rebound.  No guarding.  Central nervous system: Alert to  self only.  Moving extremities spontaneously.  Pleasantly confused. Extremities: Symmetric 5 x 5 power. Skin: No rashes, lesions or ulcers Psychiatry: Judgement and insight appear poor. Mood & affect appropriate.     Data Reviewed: I have personally reviewed following labs and imaging studies  CBC: Recent Labs  Lab 09/16/19 1113 09/17/19 0233 09/18/19 0217  WBC 17.6* 20.2* 16.1*  NEUTROABS 15.0*  --  14.3*  HGB 11.3* 10.6* 8.0*  HCT 36.6* 35.1* 26.6*  MCV 82.4 86.9 86.4  PLT 277 219 0000000   Basic Metabolic Panel: Recent Labs  Lab 09/16/19 1113 09/17/19 0233 09/18/19 0217  NA 132* 135 135  K 4.2 4.5 4.1  CL 101 103 104  CO2 22 21* 20*  GLUCOSE 184* 115* 121*  BUN 15 17 24*  CREATININE 1.06 1.06 0.93  CALCIUM 9.2 9.0 8.5*   GFR: Estimated Creatinine Clearance: 64.9 mL/min (by C-G formula based on SCr of 0.93 mg/dL). Liver Function Tests: Recent Labs  Lab 09/16/19 1113 09/17/19 0233 09/18/19 0217  AST 118* 112*  111* 50*  ALT 159* 166*  167* 96*  ALKPHOS 112 132*  133* 102  BILITOT 1.1 1.4*  1.5* 1.0  PROT 7.0 6.6  6.6 5.4*  ALBUMIN 3.6 3.1*  3.1* 2.4*   Recent Labs  Lab 09/16/19 1113  LIPASE 24   No results for input(s): AMMONIA in the last 168 hours. Coagulation Profile: No results for input(s): INR, PROTIME in the last 168 hours. Cardiac Enzymes: No results for input(s): CKTOTAL, CKMB, CKMBINDEX, TROPONINI in the last 168 hours. BNP (last 3 results) No results for input(s): PROBNP in the last 8760 hours. HbA1C: Recent Labs    09/17/19 0030  HGBA1C 6.1*   CBG: Recent Labs  Lab 09/17/19 0754 09/17/19 1621 09/17/19 1948 09/17/19 2359 09/18/19 0358  GLUCAP 115* 118* 130* 102* 124*   Lipid Profile: No results for input(s): CHOL, HDL, LDLCALC, TRIG, CHOLHDL, LDLDIRECT in the last 72 hours. Thyroid Function Tests: No results for input(s): TSH, T4TOTAL, FREET4, T3FREE, THYROIDAB in the last 72 hours. Anemia Panel: No results for  input(s): VITAMINB12, FOLATE, FERRITIN, TIBC, IRON, RETICCTPCT in the last 72 hours. Sepsis Labs: Recent Labs  Lab 09/16/19 1239 09/16/19 1446 09/17/19 0002 09/17/19 0233  LATICACIDVEN 2.9* 1.9 1.5 1.4    Recent Results (from the past 240 hour(s))  Blood culture (routine x 2)     Status: None (Preliminary result)   Collection Time: 09/16/19 12:15 PM   Specimen: Right Antecubital; Blood  Result Value Ref Range Status   Specimen Description   Final    RIGHT ANTECUBITAL Performed at [facility], [address] [place], [place] [address]    Special Requests   Final    BOTTLES DRAWN AEROBIC AND ANAEROBIC Blood Culture adequate volume Performed at [facility], [address] [place], [place] [address]    Culture  Setup Time   Final    IN BOTH AEROBIC AND ANAEROBIC BOTTLES GRAM NEGATIVE RODS CRITICAL VALUE NOTED.  VALUE IS CONSISTENT WITH PREVIOUSLY REPORTED AND CALLED VALUE.    Culture   Final    NO GROWTH < 24 HOURS Performed at [facility], [place] [address], [place], [place] [address]    Report Status PENDING  Incomplete  Blood culture (routine x 2)     Status: None (Preliminary result)   Collection Time: 09/16/19 12:30 PM   Specimen: BLOOD LEFT WRIST  Result Value Ref Range Status   Specimen Description   Final    BLOOD LEFT WRIST Performed at [facility], [address] [place], [place] [address]    Special Requests   Final    BOTTLES DRAWN AEROBIC AND ANAEROBIC Blood Culture adequate volume Performed at [facility], [address] [place], [place] [address]    Culture  Setup Time   Final    IN BOTH AEROBIC AND ANAEROBIC BOTTLES GRAM NEGATIVE RODS CRITICAL RESULT CALLED TO, READ BACK BY AND VERIFIED WITH: B GREEN PHARMD 09/17/19 0428 JDW    Culture   Final    CULTURE REINCUBATED FOR BETTER GROWTH Performed at [facility], [place] [address] [place], [place] [address]    Report Status PENDING  Incomplete   Blood Culture ID Panel (Reflexed)     Status: Abnormal   Collection Time: 09/16/19 12:30 PM  Result Value Ref Range Status   Enterococcus species NOT DETECTED NOT DETECTED Final   Listeria monocytogenes NOT DETECTED NOT DETECTED Final   Staphylococcus species NOT DETECTED NOT DETECTED Final  Staphylococcus aureus (BCID) NOT DETECTED NOT DETECTED Final   Streptococcus species NOT DETECTED NOT DETECTED Final   Streptococcus agalactiae NOT DETECTED NOT DETECTED Final   Streptococcus pneumoniae NOT DETECTED NOT DETECTED Final   Streptococcus pyogenes NOT DETECTED NOT DETECTED Final   Acinetobacter baumannii NOT DETECTED NOT DETECTED Final   Enterobacteriaceae species DETECTED (A) NOT DETECTED Final    Comment: Enterobacteriaceae represent a large family of gram-negative bacteria, not a single organism. CRITICAL RESULT CALLED TO, READ BACK BY AND VERIFIED WITH: B GREEN PHARMD 09/17/19 0428 JDW    Enterobacter cloacae complex NOT DETECTED NOT DETECTED Final   Escherichia coli DETECTED (A) NOT DETECTED Final    Comment: CRITICAL RESULT CALLED TO, READ BACK BY AND VERIFIED WITH: B GREEN PHARMD 09/17/19 0428 JDW    Klebsiella oxytoca NOT DETECTED NOT DETECTED Final   Klebsiella pneumoniae NOT DETECTED NOT DETECTED Final   Proteus species NOT DETECTED NOT DETECTED Final   Serratia marcescens NOT DETECTED NOT DETECTED Final   Carbapenem resistance NOT DETECTED NOT DETECTED Final   Haemophilus influenzae NOT DETECTED NOT DETECTED Final   Neisseria meningitidis NOT DETECTED NOT DETECTED Final   Pseudomonas aeruginosa NOT DETECTED NOT DETECTED Final   Candida albicans NOT DETECTED NOT DETECTED Final   Candida glabrata NOT DETECTED NOT DETECTED Final   Candida krusei NOT DETECTED NOT DETECTED Final   Candida parapsilosis NOT DETECTED NOT DETECTED Final   Candida tropicalis NOT DETECTED NOT DETECTED Final    Comment: Performed at [facility], [place] [address], [place], [place] [address]   SARS CORONAVIRUS 2 (TAT 6-24 HRS) Nasopharyngeal Nasopharyngeal Swab     Status: None   Collection Time: 09/16/19 12:50 PM   Specimen: Nasopharyngeal Swab  Result Value Ref Range Status   SARS Coronavirus 2 NEGATIVE NEGATIVE Final    Comment: (NOTE) SARS-CoV-2 target nucleic acids are NOT DETECTED. The SARS-CoV-2 RNA is generally detectable in upper and lower respiratory specimens during the acute phase of infection. Negative results do not preclude SARS-CoV-2 infection, do not rule out co-infections with other pathogens, and should not be used as the sole basis for treatment or other patient management decisions. Negative results must be combined with clinical observations, patient history, and epidemiological information. The expected result is Negative. Fact Sheet for Patients: SugarRoll.be Fact Sheet for Healthcare Providers: https://www.woods-mathews.com/ This test is not yet approved or cleared by the [place] FDA and  has been authorized for detection and/or diagnosis of SARS-CoV-2 by FDA under an Emergency Use Authorization (EUA). This EUA will remain  in effect (meaning this test can be used) for the duration of the COVID-19 declaration under Section 56 4(b)(1) of the Act, 21 U.S.C. section 360bbb-3(b)(1), unless the authorization is terminated or revoked sooner. Performed at [facility], [place] [address], [place], [place] [address]   MRSA PCR Screening     Status: None   Collection Time: 09/16/19 11:34 PM   Specimen: Nasal Mucosa; Nasopharyngeal  Result Value Ref Range Status   MRSA by PCR NEGATIVE NEGATIVE Final    Comment:        The GeneXpert MRSA Assay (FDA approved for NASAL specimens only), is one component of a comprehensive MRSA colonization surveillance program. It is not intended to diagnose MRSA infection nor to guide or monitor treatment for MRSA infections. Performed at [facility], [place] [address] [place], [place] [address]          Radiology Studies: CT HEAD WO  CONTRAST  Result Date: 09/17/2019 CLINICAL DATA:  Encephalopathy. EXAM: CT HEAD WITHOUT CONTRAST TECHNIQUE: Contiguous axial images were obtained from the base of the skull through the vertex without intravenous contrast. COMPARISON:  October 02, 2012. FINDINGS: Brain: Mild diffuse cortical atrophy is noted. Mild chronic ischemic white matter disease is noted. No mass effect or midline shift is noted. Ventricular size is within normal limits. There is no evidence of mass lesion, hemorrhage or acute infarction. Vascular: No hyperdense vessel or unexpected calcification. Skull: Normal. Negative for fracture or focal lesion. Sinuses/Orbits: No acute finding. Other: None. IMPRESSION: Mild diffuse cortical atrophy. Mild chronic ischemic white matter disease. No acute intracranial abnormality seen. Electronically Signed   By: Marijo Conception M.D.   On: 09/17/2019 11:46   CT ABDOMEN PELVIS W CONTRAST  Result Date: 09/16/2019 CLINICAL DATA:  Acute abdominal pain, generalized. EXAM: CT ABDOMEN AND PELVIS WITH CONTRAST TECHNIQUE: Multidetector CT imaging of the abdomen and pelvis was performed using the standard protocol following bolus administration of intravenous contrast. CONTRAST:  143mL OMNIPAQUE IOHEXOL 300 MG/ML  SOLN COMPARISON:  10/03/2018 FINDINGS: Lower chest: Small subpleural lymph node in the right lower lobe stable since 2016. Signs of scarring and mild subpleural reticulation, unchanged. No signs of consolidation or evidence of pleural effusion. Hepatobiliary: Liver is unremarkable. There is moderate distension of the gallbladder with signs of gallbladder wall thickening and pericholecystic stranding. Wall thickness difficult to read on CT but is much as 4 mm despite the distended appearance of the gallbladder. There is no intra or extrahepatic biliary ductal distension. Pancreas: Unremarkable. No  pancreatic ductal dilatation or surrounding inflammatory changes. Spleen: Normal in size without focal abnormality. Adrenals/Urinary Tract: Signs of right nephrectomy with stable small area of soft tissue nodularity along the ligated right renal artery measuring approximately 1.5 cm this has been present since 2007. Adrenal glands are normal. Small presumed cysts in the left kidney are unchanged. Stomach/Bowel: No signs of acute gastrointestinal process. Normal appendix. Vascular/Lymphatic: Calcified atherosclerotic changes of the abdominal aorta up to 3 x 2.7 cm in greatest axial dimension. No signs of adenopathy in the upper abdomen or retroperitoneum. No signs of pelvic adenopathy. Vascular disease extends into branch vessels throughout the abdomen and pelvis. Reproductive: Prostatomegaly. No sign of pelvic adenopathy. Other: No signs of free air. No abscess. Musculoskeletal: Spinal degenerative changes with signs of lower lumbar laminectomy. Grade 1 anterolisthesis of L5 on S1. Finding similar to prior study. Also mild anterolisthesis of L4 on L5. IMPRESSION: 1. CT findings of acute cholecystitis. 2. Signs of right nephrectomy with stable small area of soft tissue nodularity along the ligated right renal artery. Unchanged since 2007. 3. Aortic atherosclerosis. 4. Prostatomegaly. Aortic Atherosclerosis (ICD10-I70.0). Electronically Signed   By: Zetta Bills M.D.   On: 09/16/2019 12:39   NM Hepato W/EjeCT Fract  Result Date: 09/17/2019 CLINICAL DATA:  RIGHT upper quadrant pain with nausea and vomiting for 3 days EXAM: NUCLEAR MEDICINE HEPATOBILIARY IMAGING TECHNIQUE: Sequential images of the abdomen were obtained out to 60 minutes following intravenous administration of radiopharmaceutical. RADIOPHARMACEUTICALS:  5.35 mCi Tc-61m  Choletec IV Pharmaceutical: Morphine sulfate 3 mg IV COMPARISON:  Ultrasound abdomen 09/16/2019 FINDINGS: Normal tracer extraction from bloodstream indicating normal hepatocellular  function. Normal excretion of tracer into biliary tree. Small bowel visualized at 14 minutes. At 1 hour, gallbladder had not visualized. Patient then received morphine and imaging was continued for an additional 30 minutes. Gallbladder failed to visualize following morphine augmentation. Findings are consistent with cystic duct  obstruction and acute cholecystitis. IMPRESSION: Patent CBD. Nonvisualization of the gallbladder despite morphine augmentation consistent with cystic duct obstruction and acute cholecystitis. Electronically Signed   By: Lavonia Dana M.D.   On: 09/17/2019 14:36   US Abdomen Limited  Result Date: 09/16/2019 CLINICAL DATA:  Abdominal pain. CT abdomen and pelvis today demonstrated findings worrisome for cholecystitis. EXAM: ULTRASOUND ABDOMEN LIMITED RIGHT UPPER QUADRANT COMPARISON:  CT abdomen and pelvis earlier today. FINDINGS: Gallbladder: No gallstones or pericholecystic fluid. Sonographer reports negative Murphy's sign. The gallbladder is mildly distended and there is minimal wall thickening at 0.4 cm. Common bile duct: Diameter: 0.8 cm Liver: No focal lesion. Echogenicity is increased. Portal vein is patent on color Doppler imaging with normal direction of blood flow towards the liver. Other: None. IMPRESSION: Negative for gallstones or acute cholecystitis. Fatty infiltration of the liver. Electronically Signed   By: Inge Rise M.D.   On: 09/16/2019 13:59   DG Chest Portable 1 View  Result Date: 09/16/2019 CLINICAL DATA:  Fever, dyspnea. EXAM: PORTABLE CHEST 1 VIEW COMPARISON:  Chest radiograph 12/12/2018 FINDINGS: The patient is significantly rotated to the right, limiting evaluation. Heart size within normal limits. No definite airspace consolidation within the lungs. No evidence of pleural effusion or pneumothorax. No acute bony abnormality. Overlying cardiac monitoring leads. IMPRESSION: Patient significantly rotated to the right, limiting evaluation. No definite airspace  consolidation. Electronically Signed   By: Kellie Simmering DO   On: 09/16/2019 15:16        Scheduled Meds: . Chlorhexidine Gluconate Cloth  6 each Topical Daily  . enoxaparin (LOVENOX) injection  40 mg Subcutaneous Daily  . hydrocortisone sod succinate (SOLU-CORTEF) inj  50 mg Intravenous Q8H  . insulin aspart  0-9 Units Subcutaneous Q4H  . mouth rinse  15 mL Mouth Rinse BID   Continuous Infusions: . sodium chloride Stopped (09/16/19 1347)  . lactated ringers 100 mL/hr at 09/18/19 0600  . piperacillin-tazobactam (ZOSYN)  IV 3.375 g (09/18/19 0803)     LOS: 2 days    Time spent: 61 minutes    Irine Seal, MD [facility]  If 7PM-7AM, please contact night-coverage [email or web address] 09/18/2019, 9:08 AM

## 2019-09-18 NOTE — Consult Note (Signed)
Chief Complaint: Patient was seen in consultation today for percutaneous cholecystostomy Chief Complaint  Patient presents with  . Abdominal Pain     Referring Physician(s): Toth,P  Supervising Physician: Arne Cleveland  Patient Status: [facility] - In-pt  History of Present Illness: Derrick [name] is an 85 y.o. male with past medical history significant for anemia, anxiety, rheumatoid arthritis, atrial fibrillation, BPH along with prostate cancer, coronary artery disease/PAD on plavix, chronic kidney disease, depression, diverticulosis, GERD, hypercholesterolemia, hypertension, prior lacunar infarct, renal cell cancer with prior right nephrectomy.  He recently presented to [facility] long hospital with some persistent abdominal pain along with nausea and vomiting and intermittent fevers.  CT abdomen pelvis on 12/9 revealed findings consistent with acute cholecystitis.  Medicine hepatobiliary study on 12/10 revealed patent common bile duct with nonvisualization of the gallbladder consistent with cystic duct obstruction and acute cholecystitis.  Current labs include PT 15.1, INR 1.2, creatinine 0.93, total bilirubin 1.0, BBC 16.1, hemoglobin 8, platelets 176k, blood cultures with E. Coli, COVID-19 negative.  Patient is a poor surgical candidate and request now received for percutaneous cholecystostomy.  Last dose of Plavix was on 12/8.  Past Medical History:  Diagnosis Date  . Anemia   . Anxiety   . Arthritis, rheumatoid ([place])   . BPH (benign prostatic hyperplasia)   . CAD (coronary artery disease)   . CKD (chronic kidney disease)   . Depression   . Diverticulosis   . Elevated PSA   . Gangrene ([place])    of toe right foot  . GERD (gastroesophageal reflux disease)   . Herpes simplex labialis   . High cholesterol   . History of colonic polyps   . Hypertension   . Insomnia   . Lacunar infarction ([place])   . Memory loss   . Obesity   . Panic attacks   . Prostate cancer ([place]) YRS AGO  .  Renal cell cancer ([place])    s/p nephrectomy in 2000  . Seropositive rheumatoid arthritis ([place])   . Spermatocele   . Tinnitus   . Tremor of both hands     Past Surgical History:  Procedure Laterality Date  . AMPUTATION TOE Right 06/13/2017   Procedure: AMPUTATION TOE 1st and 2nd;  Surgeon: [name], DPM;  Location: [place];  Service: Podiatry;  Laterality: Right;  . AMPUTATION TOE     RIGHT FOOT ALL TOES GONE  . BACK SURGERY  YRS AGO   LOWER  . COLONOSCOPY    . GRAFT APPLICATION Right [id number]   Procedure: EPIFIX GRAFT APPLICATION OF RIGHT FOOT;  Surgeon: [name], DPM;  Location: [place];  Service: Podiatry;  Laterality: Right;  . I&D EXTREMITY Right 09/13/2017   Procedure: IRRIGATION AND DEBRIDEMENT AND BONE BIOPSY;  Surgeon: [name], DPM;  Location: [place];  Service: Podiatry;  Laterality: Right;  . INCISION AND DRAINAGE OF WOUND Right 12/18/2017   Procedure: IRRIGATION AND DEBRIDEMENT WOUND ON RIGHT FOOT;  Surgeon: [name], DPM;  Location: [place];  Service: Podiatry;  Laterality: Right;  . IR ANGIOGRAM EXTREMITY RIGHT  05/30/2017  . IR ANGIOGRAM EXTREMITY RIGHT  08/08/2017  . IR ANGIOGRAM SELECTIVE EACH ADDITIONAL VESSEL  08/08/2017  . IR ANGIOGRAM SELECTIVE EACH ADDITIONAL VESSEL  08/08/2017  . IR RADIOLOGIST EVAL & MGMT  05/16/2017  . IR TIB-PERO ART PTA MOD SED  05/30/2017  . IR TIB-PERO ART PTA MOD SED  08/08/2017  . IR TIB-PERO ART UNI PTA EA ADD VESSEL  MOD SED  05/30/2017  . IR TIB-PERO ART UNI PTA EA ADD VESSEL MOD SED  08/08/2017  . IR US GUIDE VASC ACCESS RIGHT  05/30/2017  . IR US GUIDE VASC ACCESS RIGHT  08/08/2017  . LEFT HEART CATHETERIZATION WITH CORONARY ANGIOGRAM N/A 11/19/2011   Procedure: LEFT HEART CATHETERIZATION WITH CORONARY ANGIOGRAM;  Surgeon: [name], MD;  Location: [facility] CATH LAB;  Service: Cardiovascular;  Laterality: N/A;  . LUMBAR LAMINECTOMY/DECOMPRESSION MICRODISCECTOMY N/A 11/26/2018   Procedure: Lumbar central decompression L2-3,  L3-4;  Surgeon: [name], MD;  Location: [facility];  Service: Orthopedics;  Laterality: N/A;  137min  . NEPHRECTOMY  10/1998  . TRANSMETATARSAL AMPUTATION Right 08/14/2017   Procedure: TRANSMETATARSAL AMPUTATION;  Surgeon: [name], DPM;  Location: [place];  Service: Podiatry;  Laterality: Right;    Allergies: Effexor [venlafaxine], Lisinopril, Paxil [paroxetine], Viagra [sildenafil], and Wellbutrin [bupropion]  Medications: Prior to Admission medications   Medication Sig Start Date End Date Taking? Authorizing Provider  acetaminophen (TYLENOL) 500 MG tablet Take 1,000 mg by mouth every 6 (six) hours as needed for moderate pain or headache.   Yes [provider]  amLODipine (NORVASC) 5 MG tablet Take 1 tablet (5 mg total) by mouth daily. 08/25/19  Yes Croitoru, Mihai, MD  aspirin 81 MG chewable tablet Chew 81 mg by mouth daily.   Yes [provider]  atorvastatin (LIPITOR) 80 MG tablet Take 40 mg by mouth daily.   Yes [provider]  clopidogrel (PLAVIX) 75 MG tablet Take 75 mg by mouth daily.   Yes [provider]  docusate sodium (COLACE) 250 MG capsule Take 250 mg by mouth daily.   Yes [provider]  finasteride (PROSCAR) 5 MG tablet Take 5 mg by mouth every evening.    Yes [provider]  hydroxychloroquine (PLAQUENIL) 200 MG tablet Take 200 mg by mouth daily.   Yes [provider]  Multiple Vitamin (MULTIVITAMIN WITH MINERALS) TABS tablet Take 1 tablet by mouth every evening.    Yes [provider]  nitroGLYCERIN (NITRODUR - DOSED IN MG/24 HR) 0.1 mg/hr patch Place 0.1 mg onto the skin daily. 08/14/19  Yes [provider]  nitroGLYCERIN (NITROSTAT) 0.3 MG SL tablet Place 0.3 mg under the tongue every 5 (five) minutes as needed for chest pain.   Yes [provider]  Omega-3 Fatty Acids (FISH OIL) 1000 MG CAPS Take 1,000 mg by mouth daily.   Yes [provider]  pantoprazole  (PROTONIX) 40 MG tablet Take 40 mg by mouth 2 (two) times daily. 08/03/19  Yes [provider]  polyethylene glycol (MIRALAX / GLYCOLAX) 17 g packet Take 17 g by mouth daily as needed for mild constipation.   Yes [provider]  predniSONE (DELTASONE) 5 MG tablet Take 5 mg by mouth daily with breakfast.   Yes [provider]  sertraline (ZOLOFT) 25 MG tablet Take 25 mg by mouth daily.   Yes [provider]  traMADol (ULTRAM) 50 MG tablet Take 50-100 mg by mouth every 6 (six) hours as needed for moderate pain.   Yes [provider]  traZODone (DESYREL) 50 MG tablet Take 50-100 mg by mouth at bedtime as needed for sleep. 08/24/19  Yes [provider]  HYDROcodone-acetaminophen (NORCO/VICODIN) 5-325 MG tablet Take 1 tablet by mouth every 6 (six) hours as needed for moderate pain ((score 4 to 6)). Patient not taking: Reported on 09/17/2019 12/13/18   Geradine Girt, DO  Family History  Problem Relation Age of Onset  . Heart attack Father 40  . Stroke Father   . Kidney failure Sister        Bright's disease   . Cancer Paternal Aunt        d. 56s; NOS cancer of leg  . Kidney disease Sister        d. 46; "Bright's disease" - possible kidney cancer?  . Cancer Sister        NOS cancer; d. 57s; "sick in the hospital"  . Heart attack Son 41  . Leukemia Son 61  . COPD Son   . Breast cancer Daughter 17    Social History   Socioeconomic History  . Marital status: Married    Spouse name: Not on file  . Number of children: Not on file  . Years of education: Not on file  . Highest education level: Not on file  Occupational History  . Not on file  Tobacco Use  . Smoking status: Former Smoker    Packs/day: 1.00    Years: 29.00    Pack years: 29.00    Types: Cigarettes    Quit date: 10/08/1978    Years since quitting: 40.9  . Smokeless tobacco: Never Used  . Tobacco comment: tried smokeless tobacco once but made him sick  Substance  and Sexual Activity  . Alcohol use: No  . Drug use: No  . Sexual activity: Not on file  Other Topics Concern  . Not on file  Social History Narrative   Retired   Married   4 children         Social Determinants of Radio broadcast assistant Strain:   . Difficulty of Paying Living Expenses: Not on file  Food Insecurity:   . Worried About Charity fundraiser in the Last Year: Not on file  . Ran Out of Food in the Last Year: Not on file  Transportation Needs:   . Lack of Transportation (Medical): Not on file  . Lack of Transportation (Non-Medical): Not on file  Physical Activity:   . Days of Exercise per Week: Not on file  . Minutes of Exercise per Session: Not on file  Stress:   . Feeling of Stress : Not on file  Social Connections:   . Frequency of Communication with Friends and Family: Not on file  . Frequency of Social Gatherings with Friends and Family: Not on file  . Attends Religious Services: Not on file  . Active Member of Clubs or Organizations: Not on file  . Attends Archivist Meetings: Not on file  . Marital Status: Not on file      Review of Systems patient currently denies fever, headache, respiratory problems, worsening abdominal pain, back pain, nausea, vomiting.  Vital Signs: BP (!) 104/58 (BP Location: Right Arm)   Pulse (!) 104   Temp 98.1 F (36.7 C) (Oral)   Resp 18   Ht 6' (1.829 m)   Wt 178 lb 1.6 oz (80.8 kg)   SpO2 93%   BMI 24.15 kg/m   Physical Exam patient slightly drowsy but arousable.  Answers simple questions; chest clear to auscultation bilaterally anteriorly.  Heart with irregularly irregular rhythm.  Abdomen soft, positive bowel sounds, nontender.  No significant lower extremity edema.  Imaging: CT HEAD WO CONTRAST  Result Date: 09/17/2019 CLINICAL DATA:  Encephalopathy. EXAM: CT HEAD WITHOUT CONTRAST TECHNIQUE: Contiguous axial images were obtained from the base of the skull through  the vertex without intravenous  contrast. COMPARISON:  October 02, 2012. FINDINGS: Brain: Mild diffuse cortical atrophy is noted. Mild chronic ischemic white matter disease is noted. No mass effect or midline shift is noted. Ventricular size is within normal limits. There is no evidence of mass lesion, hemorrhage or acute infarction. Vascular: No hyperdense vessel or unexpected calcification. Skull: Normal. Negative for fracture or focal lesion. Sinuses/Orbits: No acute finding. Other: None. IMPRESSION: Mild diffuse cortical atrophy. Mild chronic ischemic white matter disease. No acute intracranial abnormality seen. Electronically Signed   By: Marijo Conception M.D.   On: 09/17/2019 11:46   CT ABDOMEN PELVIS W CONTRAST  Result Date: 09/16/2019 CLINICAL DATA:  Acute abdominal pain, generalized. EXAM: CT ABDOMEN AND PELVIS WITH CONTRAST TECHNIQUE: Multidetector CT imaging of the abdomen and pelvis was performed using the standard protocol following bolus administration of intravenous contrast. CONTRAST:  128mL OMNIPAQUE IOHEXOL 300 MG/ML  SOLN COMPARISON:  10/03/2018 FINDINGS: Lower chest: Small subpleural lymph node in the right lower lobe stable since 2016. Signs of scarring and mild subpleural reticulation, unchanged. No signs of consolidation or evidence of pleural effusion. Hepatobiliary: Liver is unremarkable. There is moderate distension of the gallbladder with signs of gallbladder wall thickening and pericholecystic stranding. Wall thickness difficult to read on CT but is much as 4 mm despite the distended appearance of the gallbladder. There is no intra or extrahepatic biliary ductal distension. Pancreas: Unremarkable. No pancreatic ductal dilatation or surrounding inflammatory changes. Spleen: Normal in size without focal abnormality. Adrenals/Urinary Tract: Signs of right nephrectomy with stable small area of soft tissue nodularity along the ligated right renal artery measuring approximately 1.5 cm this has been present since 2007.  Adrenal glands are normal. Small presumed cysts in the left kidney are unchanged. Stomach/Bowel: No signs of acute gastrointestinal process. Normal appendix. Vascular/Lymphatic: Calcified atherosclerotic changes of the abdominal aorta up to 3 x 2.7 cm in greatest axial dimension. No signs of adenopathy in the upper abdomen or retroperitoneum. No signs of pelvic adenopathy. Vascular disease extends into branch vessels throughout the abdomen and pelvis. Reproductive: Prostatomegaly. No sign of pelvic adenopathy. Other: No signs of free air. No abscess. Musculoskeletal: Spinal degenerative changes with signs of lower lumbar laminectomy. Grade 1 anterolisthesis of L5 on S1. Finding similar to prior study. Also mild anterolisthesis of L4 on L5. IMPRESSION: 1. CT findings of acute cholecystitis. 2. Signs of right nephrectomy with stable small area of soft tissue nodularity along the ligated right renal artery. Unchanged since 2007. 3. Aortic atherosclerosis. 4. Prostatomegaly. Aortic Atherosclerosis (ICD10-I70.0). Electronically Signed   By: Zetta Bills M.D.   On: 09/16/2019 12:39   NM Hepato W/EjeCT Fract  Result Date: 09/17/2019 CLINICAL DATA:  RIGHT upper quadrant pain with nausea and vomiting for 3 days EXAM: NUCLEAR MEDICINE HEPATOBILIARY IMAGING TECHNIQUE: Sequential images of the abdomen were obtained out to 60 minutes following intravenous administration of radiopharmaceutical. RADIOPHARMACEUTICALS:  5.35 mCi Tc-77m  Choletec IV Pharmaceutical: Morphine sulfate 3 mg IV COMPARISON:  Ultrasound abdomen 09/16/2019 FINDINGS: Normal tracer extraction from bloodstream indicating normal hepatocellular function. Normal excretion of tracer into biliary tree. Small bowel visualized at 14 minutes. At 1 hour, gallbladder had not visualized. Patient then received morphine and imaging was continued for an additional 30 minutes. Gallbladder failed to visualize following morphine augmentation. Findings are consistent with  cystic duct obstruction and acute cholecystitis. IMPRESSION: Patent CBD. Nonvisualization of the gallbladder despite morphine augmentation consistent with cystic duct obstruction and acute cholecystitis. Electronically Signed  By: Lavonia Dana M.D.   On: 09/17/2019 14:36   US Abdomen Limited  Result Date: 09/16/2019 CLINICAL DATA:  Abdominal pain. CT abdomen and pelvis today demonstrated findings worrisome for cholecystitis. EXAM: ULTRASOUND ABDOMEN LIMITED RIGHT UPPER QUADRANT COMPARISON:  CT abdomen and pelvis earlier today. FINDINGS: Gallbladder: No gallstones or pericholecystic fluid. Sonographer reports negative Murphy's sign. The gallbladder is mildly distended and there is minimal wall thickening at 0.4 cm. Common bile duct: Diameter: 0.8 cm Liver: No focal lesion. Echogenicity is increased. Portal vein is patent on color Doppler imaging with normal direction of blood flow towards the liver. Other: None. IMPRESSION: Negative for gallstones or acute cholecystitis. Fatty infiltration of the liver. Electronically Signed   By: Inge Rise M.D.   On: 09/16/2019 13:59   DG Chest Portable 1 View  Result Date: 09/16/2019 CLINICAL DATA:  Fever, dyspnea. EXAM: PORTABLE CHEST 1 VIEW COMPARISON:  Chest radiograph 12/12/2018 FINDINGS: The patient is significantly rotated to the right, limiting evaluation. Heart size within normal limits. No definite airspace consolidation within the lungs. No evidence of pleural effusion or pneumothorax. No acute bony abnormality. Overlying cardiac monitoring leads. IMPRESSION: Patient significantly rotated to the right, limiting evaluation. No definite airspace consolidation. Electronically Signed   By: Kellie Simmering DO   On: 09/16/2019 15:16    Labs:  CBC: Recent Labs    07/04/19 1223 09/16/19 1113 09/17/19 0233 09/18/19 0217  WBC 10.5 17.6* 20.2* 16.1*  HGB 11.6* 11.3* 10.6* 8.0*  HCT 37.2* 36.6* 35.1* 26.6*  PLT 227 277 219 176    COAGS: Recent Labs     11/24/18 1122 07/04/19 1223 09/18/19 0905  INR 0.96 1.1 1.2  APTT  --  26  --     BMP: Recent Labs    07/04/19 1223 09/16/19 1113 09/17/19 0233 09/18/19 0217  NA 134* 132* 135 135  K 4.4 4.2 4.5 4.1  CL 101 101 103 104  CO2 22 22 21* 20*  GLUCOSE 141* 184* 115* 121*  BUN 15 15 17  24*  CALCIUM 9.4 9.2 9.0 8.5*  CREATININE 1.22 1.06 1.06 0.93  GFRNONAA 54* >60 >60 >60  GFRAA >60 >60 >60 >60    LIVER FUNCTION TESTS: Recent Labs    07/04/19 1223 09/16/19 1113 09/17/19 0233 09/18/19 0217  BILITOT 0.9 1.1 1.4*  1.5* 1.0  AST 23 118* 112*  111* 50*  ALT 23 159* 166*  167* 96*  ALKPHOS 56 112 132*  133* 102  PROT 7.0 7.0 6.6  6.6 5.4*  ALBUMIN 4.0 3.6 3.1*  3.1* 2.4*    TUMOR MARKERS: No results for input(s): AFPTM, CEA, CA199, CHROMGRNA in the last 8760 hours.  Assessment and Plan: 85 y.o. male with past medical history significant for anemia, anxiety, rheumatoid arthritis, atrial fibrillation, BPH along with prostate cancer, coronary artery disease/PAD on plavix, chronic kidney disease, depression, diverticulosis, GERD, hypercholesterolemia, hypertension, prior lacunar infarct, renal cell cancer with prior right nephrectomy.  He recently presented to [facility] long hospital with some persistent abdominal pain along with nausea and vomiting and intermittent fevers.  CT abdomen pelvis on 12/9 revealed findings consistent with acute cholecystitis.  Medicine hepatobiliary study on 12/10 revealed patent common bile duct with nonvisualization of the gallbladder consistent with cystic duct obstruction and acute cholecystitis.  Current labs include PT 15.1, INR 1.2, creatinine 0.93, total bilirubin 1.0, BBC 16.1, hemoglobin 8, platelets 176k, blood cultures with E. Coli, COVID-19 negative.  Patient is a poor surgical candidate and request now received  for percutaneous cholecystostomy.  Last dose of Plavix was on 12/8.  Imaging studies have been reviewed by Dr. Vernard Gambles.   Details/risks of procedure, including not limited to, internal bleeding, infection, injury to adjacent structures discussed with patient/spouse/son with their understanding and consent.  Procedure scheduled for today.   Thank you for this interesting consult.  I greatly enjoyed meeting Derrick [name] and look forward to participating in their care.  A copy of this report was sent to the requesting provider on this date.  Electronically Signed: D. Rowe Robert, PA-C 09/18/2019, 11:06 AM   I spent a total of  30 minutes   in face to face in clinical consultation, greater than 50% of which was counseling/coordinating care for percutaneous cholecystostomy

## 2019-09-19 ENCOUNTER — Inpatient Hospital Stay (HOSPITAL_COMMUNITY): Payer: Medicare Other

## 2019-09-19 DIAGNOSIS — I340 Nonrheumatic mitral (valve) insufficiency: Secondary | ICD-10-CM

## 2019-09-19 DIAGNOSIS — I4891 Unspecified atrial fibrillation: Secondary | ICD-10-CM

## 2019-09-19 DIAGNOSIS — I371 Nonrheumatic pulmonary valve insufficiency: Secondary | ICD-10-CM

## 2019-09-19 LAB — CULTURE, BLOOD (ROUTINE X 2)
Special Requests: ADEQUATE
Special Requests: ADEQUATE

## 2019-09-19 LAB — COMPREHENSIVE METABOLIC PANEL
ALT: 74 U/L — ABNORMAL HIGH (ref 0–44)
AST: 34 U/L (ref 15–41)
Albumin: 2.4 g/dL — ABNORMAL LOW (ref 3.5–5.0)
Alkaline Phosphatase: 89 U/L (ref 38–126)
Anion gap: 9 (ref 5–15)
BUN: 20 mg/dL (ref 8–23)
CO2: 23 mmol/L (ref 22–32)
Calcium: 8.5 mg/dL — ABNORMAL LOW (ref 8.9–10.3)
Chloride: 106 mmol/L (ref 98–111)
Creatinine, Ser: 0.95 mg/dL (ref 0.61–1.24)
GFR calc Af Amer: >60 mL/min (ref >60)
GFR calc non Af Amer: >60 mL/min (ref >60)
Glucose, Bld: 139 mg/dL — ABNORMAL HIGH (ref 70–99)
Potassium: 3.9 mmol/L (ref 3.5–5.1)
Sodium: 138 mmol/L (ref 135–145)
Total Bilirubin: 0.6 mg/dL (ref 0.3–1.2)
Total Protein: 5.5 g/dL — ABNORMAL LOW (ref 6.5–8.1)

## 2019-09-19 LAB — CBC WITH DIFFERENTIAL/PLATELET
Abs Immature Granulocytes: 0.15 10*3/uL — ABNORMAL HIGH (ref 0.00–0.07)
Basophils Absolute: 0 10*3/uL (ref 0.0–0.1)
Basophils Relative: 0 %
Eosinophils Absolute: 0 10*3/uL (ref 0.0–0.5)
Eosinophils Relative: 0 %
HCT: 29 % — ABNORMAL LOW (ref 39.0–52.0)
Hemoglobin: 8.6 g/dL — ABNORMAL LOW (ref 13.0–17.0)
Immature Granulocytes: 1 %
Lymphocytes Relative: 4 %
Lymphs Abs: 0.5 10*3/uL — ABNORMAL LOW (ref 0.7–4.0)
MCH: 25.4 pg — ABNORMAL LOW (ref 26.0–34.0)
MCHC: 29.7 g/dL — ABNORMAL LOW (ref 30.0–36.0)
MCV: 85.8 fL (ref 80.0–100.0)
Monocytes Absolute: 0.9 10*3/uL (ref 0.1–1.0)
Monocytes Relative: 7 %
Neutro Abs: 11.3 10*3/uL — ABNORMAL HIGH (ref 1.7–7.7)
Neutrophils Relative %: 88 %
Platelets: 205 10*3/uL (ref 150–400)
RBC: 3.38 MIL/uL — ABNORMAL LOW (ref 4.22–5.81)
RDW: 16.1 % — ABNORMAL HIGH (ref 11.5–15.5)
WBC: 12.9 10*3/uL — ABNORMAL HIGH (ref 4.0–10.5)
nRBC: 0 % (ref 0.0–0.2)

## 2019-09-19 LAB — ECHOCARDIOGRAM COMPLETE
Height: 72 in
Weight: 2849.6 oz

## 2019-09-19 LAB — GLUCOSE, CAPILLARY
Glucose-Capillary: 124 mg/dL — ABNORMAL HIGH (ref 70–99)
Glucose-Capillary: 148 mg/dL — ABNORMAL HIGH (ref 70–99)
Glucose-Capillary: 122 mg/dL — ABNORMAL HIGH (ref 70–99)
Glucose-Capillary: 121 mg/dL — ABNORMAL HIGH (ref 70–99)
Glucose-Capillary: 122 mg/dL — ABNORMAL HIGH (ref 70–99)
Glucose-Capillary: 119 mg/dL — ABNORMAL HIGH (ref 70–99)

## 2019-09-19 LAB — MAGNESIUM: Magnesium: 1.9 mg/dL (ref 1.7–2.4)

## 2019-09-19 MED ORDER — DILTIAZEM HCL 30 MG PO TABS
30.0000 mg | ORAL_TABLET | Freq: Four times a day (QID) | ORAL | Status: DC
  Administered 2019-09-19 (×3): 30 mg via ORAL
  Filled 2019-09-19 (×5): qty 1

## 2019-09-19 MED ORDER — MAGNESIUM SULFATE 2 GM/50ML IV SOLN
2.0000 g | Freq: Once | INTRAVENOUS | Status: AC
  Administered 2019-09-19: 2 g via INTRAVENOUS
  Filled 2019-09-19: qty 50

## 2019-09-19 NOTE — Evaluation (Signed)
Physical Therapy Evaluation Patient Details Name: Derrick [name] MRN: [id number] DOB: 08/04/1932 Today's Date: 09/19/2019   History of Present Illness  Pt admitted through ED 09/16/19 with abdominal pain.  Pt dx with sesis 2* ecoli bacteremia and acute cholecystitis.  Pt with hx of RA, memory loss, CKD, CAD, lumbar lami and R transmetatarsal amputation (18)  Clinical Impression  Pt admitted as above and presenting with functional mobility limitations 2* generalized weakness, balance deficits and cognitive deficits.  Pt would benefit from follow up rehab at SNF level to maximize IND and safety - dependent on acute stay progress and level of assist available at home.    Follow Up Recommendations SNF    Equipment Recommendations  None recommended by PT    Recommendations for Other Services       Precautions / Restrictions Precautions Precautions: Fall Restrictions Weight Bearing Restrictions: No      Mobility  Bed Mobility Overal bed mobility: Needs Assistance Bed Mobility: Supine to Sit;Sit to Supine     Supine to sit: HOB elevated;Min assist Sit to supine: HOB elevated;+2 for safety/equipment;+2 for physical assistance;Min assist   General bed mobility comments: Increased time and min assist to to move to sitting EOB;  assist of two to control trunk and bring legs up on return to bed  Transfers Overall transfer level: Needs assistance Equipment used: Rolling walker (2 wheeled) Transfers: Sit to/from Stand Sit to Stand: Mod assist;+2 physical assistance;+2 safety/equipment         General transfer comment: cues for LE management, use of UEs to self assist and physical assist to bring wt up and fwd and to balance in initial standing  Ambulation/Gait Ambulation/Gait assistance: Min assist;Mod assist;+2 physical assistance;+2 safety/equipment Gait Distance (Feet): 48 Feet Assistive device: Rolling walker (2 wheeled) Gait Pattern/deviations: Step-through pattern;Decreased  step length - right;Decreased step length - left;Shuffle;Trunk flexed;Drifts right/left Gait velocity: decr   General Gait Details: Right drift, cues for sequence, posture and position from RW; physical assist for support/balance  Stairs            Wheelchair Mobility    Modified Rankin (Stroke Patients Only)       Balance Overall balance assessment: Needs assistance Sitting-balance support: Bilateral upper extremity supported;Feet supported Sitting balance-Leahy Scale: Fair     Standing balance support: Bilateral upper extremity supported Standing balance-Leahy Scale: Poor Standing balance comment: reliant on UEs on RW and outside assist to maintain balance                             Pertinent Vitals/Pain Pain Assessment: Faces Faces Pain Scale: Hurts little more Pain Location: R side at drain site Pain Descriptors / Indicators: Sore;Grimacing;Guarding Pain Intervention(s): Limited activity within patient's tolerance;Monitored during session;Premedicated before session;Ice applied    Home Living Family/patient expects to be discharged to:: Private residence Living Arrangements: Spouse/significant other Available Help at Discharge: Family;Available 24 hours/day Type of Home: House Home Access: Stairs to enter Entrance Stairs-Rails: Right;Left;Can reach both Entrance Stairs-Number of Steps: 2 Home Layout: One level Home Equipment: Walker - 4 wheels;Bedside commode;Cane - single point;Wheelchair - Sport and exercise psychologist Comments: Spouse in room and available 24/7 but limited physically and with questionable memory     Prior Function Level of Independence: Needs assistance   Gait / Transfers Assistance Needed: Per spouse, pt is ambulating in home - sometimes using RW and sometimes not           Hand  Dominance   Dominant Hand: Right    Extremity/Trunk Assessment   Upper Extremity Assessment Upper Extremity Assessment: Generalized  weakness    Lower Extremity Assessment Lower Extremity Assessment: Generalized weakness    Cervical / Trunk Assessment Cervical / Trunk Assessment: Kyphotic  Communication   Communication: No difficulties  Cognition Arousal/Alertness: Awake/alert Behavior During Therapy: WFL for tasks assessed/performed Overall Cognitive Status: History of cognitive impairments - at baseline                                        General Comments      Exercises     Assessment/Plan    PT Assessment Patient needs continued PT services  PT Problem List Decreased strength;Decreased range of motion;Decreased activity tolerance;Decreased balance;Decreased mobility;Decreased cognition;Decreased knowledge of use of DME;Pain;Decreased safety awareness       PT Treatment Interventions DME instruction;Gait training;Stair training;Functional mobility training;Therapeutic activities;Therapeutic exercise;Balance training;Cognitive remediation    PT Goals (Current goals can be found in the Care Plan section)  Acute Rehab PT Goals Patient Stated Goal: Get up and move PT Goal Formulation: With patient Time For Goal Achievement: 10/03/19 Potential to Achieve Goals: Fair    Frequency Min 3X/week   Barriers to discharge Decreased caregiver support Home with elderly spouse    Co-evaluation               AM-PAC PT "6 Clicks" Mobility  Outcome Measure Help needed turning from your back to your side while in a flat bed without using bedrails?: A Little Help needed moving from lying on your back to sitting on the side of a flat bed without using bedrails?: A Little Help needed moving to and from a bed to a chair (including a wheelchair)?: A Lot Help needed standing up from a chair using your arms (e.g., wheelchair or bedside chair)?: A Lot Help needed to walk in hospital room?: A Lot Help needed climbing 3-5 steps with a railing? : Total 6 Click Score: 13    End of Session  Equipment Utilized During Treatment: Gait belt Activity Tolerance: Patient limited by fatigue Patient left: in bed;with call bell/phone within reach;with bed alarm set;with nursing/sitter in room;with family/visitor present Nurse Communication: Mobility status PT Visit Diagnosis: Unsteadiness on feet (R26.81);Muscle weakness (generalized) (M62.81);Difficulty in walking, not elsewhere classified (R26.2);Pain Pain - Right/Left: Right Pain - part of body: (RIght side at drain site)    Time: [id number] PT Time Calculation (min) (ACUTE ONLY): 30 min   Charges:   PT Evaluation $PT Eval Low Complexity: 1 Low PT Treatments $Gait Training: 8-22 mins        Debe Coder PT Acute Rehabilitation Services Pager [phone] Office [phone]   Kion Huntsberry 09/19/2019, 4:31 PM

## 2019-09-19 NOTE — Progress Notes (Addendum)
PROGRESS NOTE    Derrick [name]  [id number] DOB: Jun 27, 1932 DOA: 09/16/2019 PCP: [name], MD    Brief Narrative:  HPI per [name] is a 85 y.o. male with medical history significant of RA on prednisone and hydroxychloroquine, CAD and PAD, HTN.  Patient presented to [facility] ED with c/o periumbilical to lower abd pain, onset 3 days ago.  Associated with N/V.  No change in BM.  No urinary complaints.  Went to PCP today and referred to ED.   ED Course: In the ED TTP periumbilically and suprapubically without rebound or guarding.  He is septic with WBC 17k, Tm 101.6, HR 134.  Not hypotensive yet though.  Given 2L bolus.  LFTs noted to be significantly elevated with AST 118 and ALT 159.  COVID RT-PCR is negative.  Initial lactate 2.9, improves to 1.9 after IVF.  CT abd / pelvis: 1) lung bases are clear 2) findings worrisome for acute cholecystitis  [place] abd: neg for acute cholecystitis.  gen surg consulted: put on ABx, admit, get HIDA, and consult them if positive.  Although EDP note nor transfer note makes any mention of encephalopathy while in ED, by the time he arrives to the SDU here at [facility], he is encephalopathic.   Assessment & Plan:   Principal Problem:   Sepsis ([place]) Active Problems:   E coli bacteremia   Acute cholecystitis   Atrial fibrillation with RVR (HCC)   HTN (hypertension)   GERD (gastroesophageal reflux disease)   CAD (coronary artery disease)   Osteomyelitis (HCC)   Acute metabolic encephalopathy   A-fib (HCC)   Transaminitis   Nausea and vomiting   Rheumatoid arthritis (HCC)  #1 sepsis secondary to acute cholecystitis and E. coli bacteremia.  Patient presented with periumbilical to lower abdominal pain, fevers, nausea and vomiting.  Abdominal ultrasound done in the ED negative for acute cholecystitis.  CT abdomen and pelvis done worrisome for acute cholecystitis.  Patient noted to have a transaminitis, rapidly progressive  encephalopathy on admission, with positive blood cultures of E. coli.  Patient on IV fluids.  HIDA scan ordered and consistent with acute cholecystitis with cystic duct obstruction.  General surgical consultation obtained and general surgery recommending cholecystostomy tube placement at this time which would likely be in for about 6 to 8 weeks.  Per general surgery will likely need cardiac evaluation for preop clearance to determine whether patient is surgical candidate at that time to undergo cholecystectomy.  Patient status post cholecystostomy tube placement by IR on 09/18/2019 with bilious drainage.  LFTs trending down.  Leukocytosis trending down.  Patient with a E. coli bacteremia and sensitivities returned and sensitive to third-generation cephalosporins, quinolones, gentamicin, imipenem, Bactrim, Zosyn.  Continue empiric IV Zosyn.  Continue IV fluids.  Decreased stress dose steroids to IV Solu-Cortef 50 mg IV every 12 hours.  Patient noted to be on Plaquenil and chronic steroids for RA and steroid started due to worsening encephalopathy.  GI reviewed chart and due to HIDA scan results at this time is deferring to general surgery recommending that if patient does not improve with percutaneous drain or if LFTs continue to rise patient will need a MRCP and GI to be called back to reconsult.  GI formally consulted on patient on 09/18/2019.  General surgery following and appreciate input and recommendations.  2.  E. coli bacteremia Likely secondary to acute cholecystitis.  Sensitivities resulted and sensitive to third-generation cephalosporins, quinolones, gentamicin, imipenem, Bactrim, Zosyn.  Slowly improving  currently afebrile.  Leukocytosis trending down.  Continue empiric IV Zosyn.  Will likely need 10 to 14-day treatment of antibiotic.  3.  Rheumatoid arthritis Plaquenil and prednisone on hold.  Patient on IV stress dose steroids.  Solu-Cortef was decreased to every 12 hours on 09/18/2019.  Likely  resume Plaquenil once acute infection has resolved in the outpatient setting.  4.  History of atrial fibrillation/A. fib with RVR Patient noted to go into A. fib with RVR post cholecystostomy tube placement yesterday evening and had to be placed on a Cardizem drip.  Heart rate better controlled on Cardizem drip.  We will transition to Cardizem 30 mg p.o. every 6 hours and turn off Cardizem drip 2 hours after oral Cardizem has been given. Check 2 d echo.  Patient on chronic Plavix for coronary artery disease/PAD but not on chronic blood thinners.  Plavix on hold for cholecystostomy tube placement.  General surgery/IR to advise when Plavix may be resumed.   5.  Acute metabolic encephalopathy Likely secondary to problem #1. May have underlying mild cognitive dysfunction. Patient also with a E. coli bacteremia.  Head CT with no acute abnormalities noted.  Continue empiric IV Zosyn.  Supportive care.   6.  Coronary artery disease/PAD Stable.  Plavix on hold prior to cholecystostomy tube placement per IR.  General surgery to advise when Plavix may be resumed.  Continue home regimen of aspirin, Lipitor, fish oil.  Will likely need outpatient evaluation by cardiology post discharge for preop clearance for possible cholecystectomy in about 6 to 8 weeks.  General surgery to advise when Plavix may be resumed.  7.  Hypertension Blood pressure was borderline yesterday however improved.  Patient currently on Cardizem drip as noted to go into A. fib with RVR post procedure on 09/18/2019.  Continue IV Solu-Cortef every 12 hours and taper.   8.  Gastroesophageal reflux disease Continue PPI.    DVT prophylaxis: Lovenox Code Status: Full Family Communication: Updated patient.  Updated son, Prescott Gum over telephone. Disposition Plan: Remain in stepdown unit.  To be determined.   Consultants:   GI: Dr. Watt Climes 09/18/2019   General surgery: Saverio Danker, PA/Dr. Marlou Starks 09/17/2019  Procedures:   CT abdomen  and pelvis 09/16/2019  Right upper quadrant ultrasound 09/16/2019  Chest x-ray 09/16/2019  HIDA scan 09/17/2019  CT head 09/17/2019  IR/ultrasound percutaneous cholecystostomy cath placement per Dr. Vernard Gambles 09/18/2019  Antimicrobials:   IV Zosyn 09/16/2019   Subjective: Patient in bed in mittens.  Denies any significant shortness of breath.  Complains of chest pain that points towards his abdomen.  Complains of abdominal pain.  No nausea or vomiting.  Asking whether he can have a Coke.   Objective: Vitals:   09/19/19 0326 09/19/19 0400 09/19/19 0700 09/19/19 0800  BP:  (!) 157/68 (!) 106/44 120/73  Pulse:  100 79 68  Resp:  (!) 26 (!) 24 12  Temp: 98.6 F (37 C)  98.2 F (36.8 C)   TempSrc: Axillary  Oral   SpO2:  95% 96% 100%  Weight:      Height:        Intake/Output Summary (Last 24 hours) at 09/19/2019 0922 Last data filed at 09/19/2019 0645 Gross per 24 hour  Intake 2466.58 ml  Output 635 ml  Net 1831.58 ml   Filed Weights   09/16/19 1110  Weight: 80.8 kg    Examination:  General exam: Pleasantly confused. Dry mucous membranes.  In mittens Respiratory system: Lungs clear to auscultation bilaterally.  No wheezes, no crackles, no rhonchi.  Normal respiratory effort.  Cardiovascular system: Irregularly irregular.  No JVD.  No lower extremity edema.   Gastrointestinal system: Abdomen is soft, nondistended, tender to palpation in the right upper quadrant, positive bowel sounds.  No rebound.  No guarding.  Cholecystostomy tube intact with biliary drainage noted.  Central nervous system: Alert to self only.  Moving extremities spontaneously.  Pleasantly confused. Extremities: s/p right transmetartasal amputation. Skin: No rashes, lesions or ulcers Psychiatry: Judgement and insight appear poor. Mood & affect appropriate.     Data Reviewed: I have personally reviewed following labs and imaging studies  CBC: Recent Labs  Lab 09/16/19 1113 09/17/19 0233  09/18/19 0217 09/19/19 0158  WBC 17.6* 20.2* 16.1* 12.9*  NEUTROABS 15.0*  --  14.3* 11.3*  HGB 11.3* 10.6* 8.0* 8.6*  HCT 36.6* 35.1* 26.6* 29.0*  MCV 82.4 86.9 86.4 85.8  PLT 277 219 176 [id number]   Basic Metabolic Panel: Recent Labs  Lab 09/16/19 1113 09/17/19 0233 09/18/19 0217 09/19/19 0158  NA 132* 135 135 138  K 4.2 4.5 4.1 3.9  CL 101 103 104 106  CO2 22 21* 20* 23  GLUCOSE 184* 115* 121* 139*  BUN 15 17 24* 20  CREATININE 1.06 1.06 0.93 0.95  CALCIUM 9.2 9.0 8.5* 8.5*  MG  --   --   --  1.9   GFR: Estimated Creatinine Clearance: 63.5 mL/min (by C-G formula based on SCr of 0.95 mg/dL). Liver Function Tests: Recent Labs  Lab 09/16/19 1113 09/17/19 0233 09/18/19 0217 09/19/19 0158  AST 118* 112*  111* 50* 34  ALT 159* 166*  167* 96* 74*  ALKPHOS 112 132*  133* 102 89  BILITOT 1.1 1.4*  1.5* 1.0 0.6  PROT 7.0 6.6  6.6 5.4* 5.5*  ALBUMIN 3.6 3.1*  3.1* 2.4* 2.4*   Recent Labs  Lab 09/16/19 1113  LIPASE 24   No results for input(s): AMMONIA in the last 168 hours. Coagulation Profile: Recent Labs  Lab 09/18/19 0905  INR 1.2   Cardiac Enzymes: No results for input(s): CKTOTAL, CKMB, CKMBINDEX, TROPONINI in the last 168 hours. BNP (last 3 results) No results for input(s): PROBNP in the last 8760 hours. HbA1C: Recent Labs    09/17/19 0030  HGBA1C 6.1*   CBG: Recent Labs  Lab 09/18/19 1218 09/18/19 1720 09/18/19 2004 09/18/19 2356 09/19/19 0324  GLUCAP 109* 124* 98 119* 148*   Lipid Profile: No results for input(s): CHOL, HDL, LDLCALC, TRIG, CHOLHDL, LDLDIRECT in the last 72 hours. Thyroid Function Tests: No results for input(s): TSH, T4TOTAL, FREET4, T3FREE, THYROIDAB in the last 72 hours. Anemia Panel: No results for input(s): VITAMINB12, FOLATE, FERRITIN, TIBC, IRON, RETICCTPCT in the last 72 hours. Sepsis Labs: Recent Labs  Lab 09/16/19 1239 09/16/19 1446 09/17/19 0002 09/17/19 0233  LATICACIDVEN 2.9* 1.9 1.5 1.4    Recent  Results (from the past 240 hour(s))  Blood culture (routine x 2)     Status: Abnormal   Collection Time: 09/16/19 12:15 PM   Specimen: Right Antecubital; Blood  Result Value Ref Range Status   Specimen Description   Final    RIGHT ANTECUBITAL Performed at [facility], [address] [place], [place] [address]    Special Requests   Final    BOTTLES DRAWN AEROBIC AND ANAEROBIC Blood Culture adequate volume Performed at [facility], [address] [place], [place] [address]    Culture  Setup Time  Final    IN BOTH AEROBIC AND ANAEROBIC BOTTLES GRAM NEGATIVE RODS CRITICAL VALUE NOTED.  VALUE IS CONSISTENT WITH PREVIOUSLY REPORTED AND CALLED VALUE.    Culture (A)  Final    ESCHERICHIA COLI SUSCEPTIBILITIES PERFORMED ON PREVIOUS CULTURE WITHIN THE LAST 5 DAYS. Performed at [facility], [place] [address], [place], [place] [address]    Report Status 09/19/2019 FINAL  Final  Blood culture (routine x 2)     Status: Abnormal   Collection Time: 09/16/19 12:30 PM   Specimen: BLOOD LEFT WRIST  Result Value Ref Range Status   Specimen Description   Final    BLOOD LEFT WRIST Performed at [facility] [address] [place], [place] [address]    Special Requests   Final    BOTTLES DRAWN AEROBIC AND ANAEROBIC Blood Culture adequate volume Performed at [facility], [address] [place], [place] [address]    Culture  Setup Time   Final    IN BOTH AEROBIC AND ANAEROBIC BOTTLES GRAM NEGATIVE RODS CRITICAL RESULT CALLED TO, READ BACK BY AND VERIFIED WITH: B GREEN PHARMD 09/17/19 0428 JDW Performed at [facility], [place] [address] [place], [place] [address]    Culture ESCHERICHIA COLI (A)  Final   Report Status 09/19/2019 FINAL  Final   Organism ID, Bacteria ESCHERICHIA COLI  Final      Susceptibility   Escherichia coli - MIC*    AMPICILLIN >=32 RESISTANT Resistant     CEFAZOLIN >=64 RESISTANT Resistant     CEFEPIME <=1 SENSITIVE  Sensitive     CEFTAZIDIME <=1 SENSITIVE Sensitive     CEFTRIAXONE <=1 SENSITIVE Sensitive     CIPROFLOXACIN <=0.25 SENSITIVE Sensitive     GENTAMICIN <=1 SENSITIVE Sensitive     IMIPENEM <=0.25 SENSITIVE Sensitive     TRIMETH/SULFA <=20 SENSITIVE Sensitive     AMPICILLIN/SULBACTAM >=32 RESISTANT Resistant     PIP/TAZO 8 SENSITIVE Sensitive     * ESCHERICHIA COLI  Blood Culture ID Panel (Reflexed)     Status: Abnormal   Collection Time: 09/16/19 12:30 PM  Result Value Ref Range Status   Enterococcus species NOT DETECTED NOT DETECTED Final   Listeria monocytogenes NOT DETECTED NOT DETECTED Final   Staphylococcus species NOT DETECTED NOT DETECTED Final   Staphylococcus aureus (BCID) NOT DETECTED NOT DETECTED Final   Streptococcus species NOT DETECTED NOT DETECTED Final   Streptococcus agalactiae NOT DETECTED NOT DETECTED Final   Streptococcus pneumoniae NOT DETECTED NOT DETECTED Final   Streptococcus pyogenes NOT DETECTED NOT DETECTED Final   Acinetobacter baumannii NOT DETECTED NOT DETECTED Final   Enterobacteriaceae species DETECTED (A) NOT DETECTED Final    Comment: Enterobacteriaceae represent a large family of gram-negative bacteria, not a single organism. CRITICAL RESULT CALLED TO, READ BACK BY AND VERIFIED WITH: B GREEN PHARMD 09/17/19 0428 JDW    Enterobacter cloacae complex NOT DETECTED NOT DETECTED Final   Escherichia coli DETECTED (A) NOT DETECTED Final    Comment: CRITICAL RESULT CALLED TO, READ BACK BY AND VERIFIED WITH: B GREEN PHARMD 09/17/19 0428 JDW    Klebsiella oxytoca NOT DETECTED NOT DETECTED Final   Klebsiella pneumoniae NOT DETECTED NOT DETECTED Final   Proteus species NOT DETECTED NOT DETECTED Final   Serratia marcescens NOT DETECTED NOT DETECTED Final   Carbapenem resistance NOT DETECTED NOT DETECTED Final   Haemophilus influenzae NOT DETECTED NOT DETECTED Final   Neisseria meningitidis NOT DETECTED NOT DETECTED Final  Pseudomonas aeruginosa NOT DETECTED  NOT DETECTED Final   Candida albicans NOT DETECTED NOT DETECTED Final   Candida glabrata NOT DETECTED NOT DETECTED Final   Candida krusei NOT DETECTED NOT DETECTED Final   Candida parapsilosis NOT DETECTED NOT DETECTED Final   Candida tropicalis NOT DETECTED NOT DETECTED Final    Comment: Performed at [facility], [place] [address] [place], [place] [address]  SARS CORONAVIRUS 2 (TAT 6-24 HRS) Nasopharyngeal Nasopharyngeal Swab     Status: None   Collection Time: 09/16/19 12:50 PM   Specimen: Nasopharyngeal Swab  Result Value Ref Range Status   SARS Coronavirus 2 NEGATIVE NEGATIVE Final    Comment: (NOTE) SARS-CoV-2 target nucleic acids are NOT DETECTED. The SARS-CoV-2 RNA is generally detectable in upper and lower respiratory specimens during the acute phase of infection. Negative results do not preclude SARS-CoV-2 infection, do not rule out co-infections with other pathogens, and should not be used as the sole basis for treatment or other patient management decisions. Negative results must be combined with clinical observations, patient history, and epidemiological information. The expected result is Negative. Fact Sheet for Patients: SugarRoll.be Fact Sheet for Healthcare Providers: https://www.woods-mathews.com/ This test is not yet approved or cleared by the [place] FDA and  has been authorized for detection and/or diagnosis of SARS-CoV-2 by FDA under an Emergency Use Authorization (EUA). This EUA will remain  in effect (meaning this test can be used) for the duration of the COVID-19 declaration under Section 56 4(b)(1) of the Act, 21 U.S.C. section 360bbb-3(b)(1), unless the authorization is terminated or revoked sooner. Performed at [facility], [place] [address], [place], [place] [address]   MRSA PCR Screening     Status: None   Collection Time: 09/16/19 11:34 PM   Specimen: Nasal Mucosa; Nasopharyngeal  Result Value  Ref Range Status   MRSA by PCR NEGATIVE NEGATIVE Final    Comment:        The GeneXpert MRSA Assay (FDA approved for NASAL specimens only), is one component of a comprehensive MRSA colonization surveillance program. It is not intended to diagnose MRSA infection nor to guide or monitor treatment for MRSA infections. Performed at [facility], [place] [address] [place], [place] [address]          Radiology Studies: CT HEAD WO CONTRAST  Result Date: 09/17/2019 CLINICAL DATA:  Encephalopathy. EXAM: CT HEAD WITHOUT CONTRAST TECHNIQUE: Contiguous axial images were obtained from the base of the skull through the vertex without intravenous contrast. COMPARISON:  October 02, 2012. FINDINGS: Brain: Mild diffuse cortical atrophy is noted. Mild chronic ischemic white matter disease is noted. No mass effect or midline shift is noted. Ventricular size is within normal limits. There is no evidence of mass lesion, hemorrhage or acute infarction. Vascular: No hyperdense vessel or unexpected calcification. Skull: Normal. Negative for fracture or focal lesion. Sinuses/Orbits: No acute finding. Other: None. IMPRESSION: Mild diffuse cortical atrophy. Mild chronic ischemic white matter disease. No acute intracranial abnormality seen. Electronically Signed   By: Marijo Conception M.D.   On: 09/17/2019 11:46   NM Hepato W/EjeCT Fract  Result Date: 09/17/2019 CLINICAL DATA:  RIGHT upper quadrant pain with nausea and vomiting for 3 days EXAM: NUCLEAR MEDICINE HEPATOBILIARY IMAGING TECHNIQUE: Sequential images of the abdomen were obtained out to 60 minutes following intravenous administration of radiopharmaceutical. RADIOPHARMACEUTICALS:  5.35 mCi Tc-12m  Choletec IV Pharmaceutical: Morphine sulfate 3 mg IV COMPARISON:  Ultrasound abdomen 09/16/2019 FINDINGS: Normal tracer extraction from  bloodstream indicating normal hepatocellular function. Normal excretion of tracer into biliary tree. Small  bowel visualized at 14 minutes. At 1 hour, gallbladder had not visualized. Patient then received morphine and imaging was continued for an additional 30 minutes. Gallbladder failed to visualize following morphine augmentation. Findings are consistent with cystic duct obstruction and acute cholecystitis. IMPRESSION: Patent CBD. Nonvisualization of the gallbladder despite morphine augmentation consistent with cystic duct obstruction and acute cholecystitis. Electronically Signed   By: Lavonia Dana M.D.   On: 09/17/2019 14:36   IR Perc Cholecystostomy  Result Date: 09/18/2019 CLINICAL DATA:  Acute cholecystitis, currently poor operative candidate EXAM: PERCUTANEOUS CHOLECYSTOSTOMY TUBE PLACEMENT WITH ULTRASOUND AND FLUOROSCOPIC GUIDANCE FLUOROSCOPY TIME:  0.5 minutes; 197 uGym2 DAP TECHNIQUE: The procedure, risks (including but not limited to bleeding, infection, organ damage ), benefits, and alternatives were explained to the patient and family. Questions regarding the procedure were encouraged and answered. The family understands and consents to the procedure. Survey ultrasound of the abdomen was performed and an appropriate skin entry site was identified. Skin site was marked, prepped with Betadine, and draped in usual sterile fashion, and infiltrated locally with 1% lidocaine. Intravenous Fentanyl 188mcg and Versed 2mg  were administered as conscious sedation during continuous monitoring of the patient's level of consciousness and physiological / cardiorespiratory status by the radiology RN, with a total moderate sedation time of 10 minutes. Under real-time ultrasound guidance, gallbladder was accessed using a transhepatic approach with a 21-gauge needle. Ultrasound image documentation was saved. Bile returned through the hub. Needle was exchanged over a 018 guidewire for transitional dilator which allowed placement of 035 J wire. Over this, a 10.2 French pigtail catheter was advanced and formed centrally in the  gallbladder lumen. Small contrast injection confirmed appropriate position. Catheter secured externally with 0 Prolene suture and placed external drain bag. Patient tolerated the procedure well. COMPLICATIONS: COMPLICATIONS none IMPRESSION: 1. Technically successful percutaneous cholecystostomy tube placement with ultrasound and fluoroscopic guidance. Electronically Signed   By: Lucrezia Europe M.D.   On: 09/18/2019 14:56        Scheduled Meds: . aspirin  81 mg Oral Daily  . atorvastatin  40 mg Oral Daily  . Chlorhexidine Gluconate Cloth  6 each Topical Daily  . docusate sodium  250 mg Oral Daily  . enoxaparin (LOVENOX) injection  40 mg Subcutaneous Daily  . finasteride  5 mg Oral QPM  . hydrocortisone sod succinate (SOLU-CORTEF) inj  50 mg Intravenous Q12H  . insulin aspart  0-9 Units Subcutaneous Q4H  . mouth rinse  15 mL Mouth Rinse BID  . omega-3 acid ethyl esters  1 g Oral Daily  . pantoprazole  40 mg Oral BID  . sertraline  25 mg Oral Daily  . sodium chloride flush  5 mL Intracatheter Q8H   Continuous Infusions: . sodium chloride Stopped (09/16/19 1347)  . diltiazem (CARDIZEM) infusion 5 mg/hr (09/19/19 0600)  . lactated ringers 100 mL/hr at 09/19/19 0600  . magnesium sulfate bolus IVPB    . piperacillin-tazobactam (ZOSYN)  IV 3.375 g (09/19/19 0856)     LOS: 3 days    Time spent: 13 minutes    Irine Seal, MD [facility]  If 7PM-7AM, please contact night-coverage [email or web address] 09/19/2019, 9:22 AM

## 2019-09-19 NOTE — Progress Notes (Signed)
Referring Physician(s): Dr Leighton Ruff  Supervising Physician: Corrie Mckusick  Patient Status:  [facility] - In-pt  Chief Complaint:  Perc chole drain placed in IR 12/11  Subjective:  Up in bed No complaints Minimally confused  Allergies: Effexor [venlafaxine], Lisinopril, Paxil [paroxetine], Viagra [sildenafil], and Wellbutrin [bupropion]  Medications: Prior to Admission medications   Medication Sig Start Date End Date Taking? Authorizing Provider  acetaminophen (TYLENOL) 500 MG tablet Take 1,000 mg by mouth every 6 (six) hours as needed for moderate pain or headache.   Yes [provider]  amLODipine (NORVASC) 5 MG tablet Take 1 tablet (5 mg total) by mouth daily. 08/25/19  Yes Croitoru, Mihai, MD  aspirin 81 MG chewable tablet Chew 81 mg by mouth daily.   Yes [provider]  atorvastatin (LIPITOR) 80 MG tablet Take 40 mg by mouth daily.   Yes [provider]  clopidogrel (PLAVIX) 75 MG tablet Take 75 mg by mouth daily.   Yes [provider]  docusate sodium (COLACE) 250 MG capsule Take 250 mg by mouth daily.   Yes [provider]  finasteride (PROSCAR) 5 MG tablet Take 5 mg by mouth every evening.    Yes [provider]  hydroxychloroquine (PLAQUENIL) 200 MG tablet Take 200 mg by mouth daily.   Yes [provider]  Multiple Vitamin (MULTIVITAMIN WITH MINERALS) TABS tablet Take 1 tablet by mouth every evening.    Yes [provider]  nitroGLYCERIN (NITRODUR - DOSED IN MG/24 HR) 0.1 mg/hr patch Place 0.1 mg onto the skin daily. 08/14/19  Yes [provider]  nitroGLYCERIN (NITROSTAT) 0.3 MG SL tablet Place 0.3 mg under the tongue every 5 (five) minutes as needed for chest pain.   Yes [provider]  Omega-3 Fatty Acids (FISH OIL) 1000 MG CAPS Take 1,000 mg by mouth daily.   Yes [provider]  pantoprazole (PROTONIX) 40 MG tablet Take 40 mg by mouth 2 (two) times daily. 08/03/19   Yes [provider]  polyethylene glycol (MIRALAX / GLYCOLAX) 17 g packet Take 17 g by mouth daily as needed for mild constipation.   Yes [provider]  predniSONE (DELTASONE) 5 MG tablet Take 5 mg by mouth daily with breakfast.   Yes [provider]  sertraline (ZOLOFT) 25 MG tablet Take 25 mg by mouth daily.   Yes [provider]  traMADol (ULTRAM) 50 MG tablet Take 50-100 mg by mouth every 6 (six) hours as needed for moderate pain.   Yes [provider]  traZODone (DESYREL) 50 MG tablet Take 50-100 mg by mouth at bedtime as needed for sleep. 08/24/19  Yes [provider]  HYDROcodone-acetaminophen (NORCO/VICODIN) 5-325 MG tablet Take 1 tablet by mouth every 6 (six) hours as needed for moderate pain ((score 4 to 6)). Patient not taking: Reported on 09/17/2019 12/13/18   Geradine Girt, DO     Vital Signs: BP 132/86   Pulse 95   Temp 98.2 F (36.8 C) (Oral)   Resp 17   Ht 6' (1.829 m)   Wt 178 lb 1.6 oz (80.8 kg)   SpO2 96%   BMI 24.15 kg/m   Physical Exam Skin:    General: Skin is warm and dry.     Comments: Site of chole drain is clean and dry NT no bleeding OP 400 cc yesterday 100 in bag now-- bloody bile     Imaging: CT HEAD WO CONTRAST  Result Date: 09/17/2019 CLINICAL DATA:  Encephalopathy. EXAM: CT HEAD WITHOUT CONTRAST TECHNIQUE: Contiguous axial images were obtained from the base of the skull through the vertex without intravenous contrast. COMPARISON:  October 02, 2012. FINDINGS: Brain: Mild diffuse cortical atrophy is noted. Mild chronic ischemic white matter disease is noted. No mass effect or midline shift is noted. Ventricular size is within normal limits. There is no evidence of mass lesion, hemorrhage or acute infarction. Vascular: No hyperdense vessel or unexpected calcification. Skull: Normal. Negative for fracture or focal lesion. Sinuses/Orbits: No acute finding. Other: None. IMPRESSION: Mild diffuse  cortical atrophy. Mild chronic ischemic white matter disease. No acute intracranial abnormality seen. Electronically Signed   By: Marijo Conception M.D.   On: 09/17/2019 11:46   CT ABDOMEN PELVIS W CONTRAST  Result Date: 09/16/2019 CLINICAL DATA:  Acute abdominal pain, generalized. EXAM: CT ABDOMEN AND PELVIS WITH CONTRAST TECHNIQUE: Multidetector CT imaging of the abdomen and pelvis was performed using the standard protocol following bolus administration of intravenous contrast. CONTRAST:  171mL OMNIPAQUE IOHEXOL 300 MG/ML  SOLN COMPARISON:  10/03/2018 FINDINGS: Lower chest: Small subpleural lymph node in the right lower lobe stable since 2016. Signs of scarring and mild subpleural reticulation, unchanged. No signs of consolidation or evidence of pleural effusion. Hepatobiliary: Liver is unremarkable. There is moderate distension of the gallbladder with signs of gallbladder wall thickening and pericholecystic stranding. Wall thickness difficult to read on CT but is much as 4 mm despite the distended appearance of the gallbladder. There is no intra or extrahepatic biliary ductal distension. Pancreas: Unremarkable. No pancreatic ductal dilatation or surrounding inflammatory changes. Spleen: Normal in size without focal abnormality. Adrenals/Urinary Tract: Signs of right nephrectomy with stable small area of soft tissue nodularity along the ligated right renal artery measuring approximately 1.5 cm this has been present since 2007. Adrenal glands are normal. Small presumed cysts in the left kidney are unchanged. Stomach/Bowel: No signs of acute gastrointestinal process. Normal appendix. Vascular/Lymphatic: Calcified atherosclerotic changes of the abdominal aorta up to 3 x 2.7 cm in greatest axial dimension. No signs of adenopathy in the upper abdomen or retroperitoneum. No signs of pelvic adenopathy. Vascular disease extends into branch vessels throughout the abdomen and pelvis. Reproductive: Prostatomegaly. No sign  of pelvic adenopathy. Other: No signs of free air. No abscess. Musculoskeletal: Spinal degenerative changes with signs of lower lumbar laminectomy. Grade 1 anterolisthesis of L5 on S1. Finding similar to prior study. Also mild anterolisthesis of L4 on L5. IMPRESSION: 1. CT findings of acute cholecystitis. 2. Signs of right nephrectomy with stable small area of soft tissue nodularity along the ligated right renal artery. Unchanged since 2007. 3. Aortic atherosclerosis. 4. Prostatomegaly. Aortic Atherosclerosis (ICD10-I70.0). Electronically Signed   By: Zetta Bills M.D.   On: 09/16/2019 12:39   NM Hepato W/EjeCT Fract  Result Date: 09/17/2019 CLINICAL DATA:  RIGHT upper quadrant pain with nausea and vomiting for 3 days EXAM: NUCLEAR MEDICINE HEPATOBILIARY IMAGING TECHNIQUE: Sequential images of the abdomen were obtained out to 60 minutes following intravenous administration of radiopharmaceutical. RADIOPHARMACEUTICALS:  5.35 mCi Tc-70m  Choletec IV Pharmaceutical: Morphine sulfate 3 mg IV COMPARISON:  Ultrasound abdomen 09/16/2019 FINDINGS: Normal tracer extraction from bloodstream indicating normal hepatocellular function. Normal excretion of tracer into biliary tree. Small bowel visualized at 14 minutes. At 1 hour, gallbladder had not visualized. Patient then received morphine and imaging was continued for an additional 30 minutes. Gallbladder failed to visualize following morphine augmentation. Findings are consistent with cystic duct obstruction and acute cholecystitis. IMPRESSION: Patent CBD. Nonvisualization  of the gallbladder despite morphine augmentation consistent with cystic duct obstruction and acute cholecystitis. Electronically Signed   By: Lavonia Dana M.D.   On: 09/17/2019 14:36   US Abdomen Limited  Result Date: 09/16/2019 CLINICAL DATA:  Abdominal pain. CT abdomen and pelvis today demonstrated findings worrisome for cholecystitis. EXAM: ULTRASOUND ABDOMEN LIMITED RIGHT UPPER QUADRANT  COMPARISON:  CT abdomen and pelvis earlier today. FINDINGS: Gallbladder: No gallstones or pericholecystic fluid. Sonographer reports negative Murphy's sign. The gallbladder is mildly distended and there is minimal wall thickening at 0.4 cm. Common bile duct: Diameter: 0.8 cm Liver: No focal lesion. Echogenicity is increased. Portal vein is patent on color Doppler imaging with normal direction of blood flow towards the liver. Other: None. IMPRESSION: Negative for gallstones or acute cholecystitis. Fatty infiltration of the liver. Electronically Signed   By: Inge Rise M.D.   On: 09/16/2019 13:59   IR Perc Cholecystostomy  Result Date: 09/18/2019 CLINICAL DATA:  Acute cholecystitis, currently poor operative candidate EXAM: PERCUTANEOUS CHOLECYSTOSTOMY TUBE PLACEMENT WITH ULTRASOUND AND FLUOROSCOPIC GUIDANCE FLUOROSCOPY TIME:  0.5 minutes; 197 uGym2 DAP TECHNIQUE: The procedure, risks (including but not limited to bleeding, infection, organ damage ), benefits, and alternatives were explained to the patient and family. Questions regarding the procedure were encouraged and answered. The family understands and consents to the procedure. Survey ultrasound of the abdomen was performed and an appropriate skin entry site was identified. Skin site was marked, prepped with Betadine, and draped in usual sterile fashion, and infiltrated locally with 1% lidocaine. Intravenous Fentanyl 129mcg and Versed 2mg  were administered as conscious sedation during continuous monitoring of the patient's level of consciousness and physiological / cardiorespiratory status by the radiology RN, with a total moderate sedation time of 10 minutes. Under real-time ultrasound guidance, gallbladder was accessed using a transhepatic approach with a 21-gauge needle. Ultrasound image documentation was saved. Bile returned through the hub. Needle was exchanged over a 018 guidewire for transitional dilator which allowed placement of 035 J wire.  Over this, a 10.2 French pigtail catheter was advanced and formed centrally in the gallbladder lumen. Small contrast injection confirmed appropriate position. Catheter secured externally with 0 Prolene suture and placed external drain bag. Patient tolerated the procedure well. COMPLICATIONS: COMPLICATIONS none IMPRESSION: 1. Technically successful percutaneous cholecystostomy tube placement with ultrasound and fluoroscopic guidance. Electronically Signed   By: Lucrezia Europe M.D.   On: 09/18/2019 14:56   DG Chest Portable 1 View  Result Date: 09/16/2019 CLINICAL DATA:  Fever, dyspnea. EXAM: PORTABLE CHEST 1 VIEW COMPARISON:  Chest radiograph 12/12/2018 FINDINGS: The patient is significantly rotated to the right, limiting evaluation. Heart size within normal limits. No definite airspace consolidation within the lungs. No evidence of pleural effusion or pneumothorax. No acute bony abnormality. Overlying cardiac monitoring leads. IMPRESSION: Patient significantly rotated to the right, limiting evaluation. No definite airspace consolidation. Electronically Signed   By: Kellie Simmering DO   On: 09/16/2019 15:16    Labs:  CBC: Recent Labs    09/16/19 1113 09/17/19 0233 09/18/19 0217 09/19/19 0158  WBC 17.6* 20.2* 16.1* 12.9*  HGB 11.3* 10.6* 8.0* 8.6*  HCT 36.6* 35.1* 26.6* 29.0*  PLT 277 219 176 205    COAGS: Recent Labs    11/24/18 1122 07/04/19 1223 09/18/19 0905  INR 0.96 1.1 1.2  APTT  --  26  --     BMP: Recent Labs    09/16/19 1113 09/17/19 0233 09/18/19 0217 09/19/19 0158  NA 132* 135 135 138  K  4.2 4.5 4.1 3.9  CL 101 103 104 106  CO2 22 21* 20* 23  GLUCOSE 184* 115* 121* 139*  BUN 15 17 24* 20  CALCIUM 9.2 9.0 8.5* 8.5*  CREATININE 1.06 1.06 0.93 0.95  GFRNONAA >60 >60 >60 >60  GFRAA >60 >60 >60 >60    LIVER FUNCTION TESTS: Recent Labs    09/16/19 1113 09/17/19 0233 09/18/19 0217 09/19/19 0158  BILITOT 1.1 1.4*  1.5* 1.0 0.6  AST 118* 112*  111* 50* 34  ALT  159* 166*  167* 96* 74*  ALKPHOS 112 132*  133* 102 89  PROT 7.0 6.6  6.6 5.4* 5.5*  ALBUMIN 3.6 3.1*  3.1* 2.4* 2.4*    Assessment and Plan:  Percutaneous chole drain intact Wbc down Afebrile Will follow Will need drain 8 weeks or more-- plan per [facility] Will need flushed daily - even at home; 10 cc sterile saline daily And record OP  Electronically Signed: Lavonia Drafts, PA-C 09/19/2019, 12:05 PM   I spent a total of 15 Minutes at the the patient's bedside AND on the patient's hospital floor or unit, greater than 50% of which was counseling/coordinating care for perc chole drain

## 2019-09-19 NOTE — Progress Notes (Signed)
  Echocardiogram 2D Echocardiogram has been performed.  Derrick [name]  09/19/2019, 11:51 AM

## 2019-09-19 NOTE — Progress Notes (Signed)
Subjective: No complaints.   Objective: Vital signs in last 24 hours: Temp:  [97.9 F (36.6 C)-100.3 F (37.9 C)] 98.2 F (36.8 C) (12/12 0700) Pulse Rate:  [68-140] 68 (12/12 0800) Resp:  [12-26] 12 (12/12 0800) BP: (99-203)/(44-117) 120/73 (12/12 0800) SpO2:  [90 %-100 %] 100 % (12/12 0800)    Intake/Output from previous day: 12/11 0701 - 12/12 0700 In: 2466.6 [I.V.:2316.8; IV Piggyback:149.8] Out: 935 [Urine:550; Drains:385] Intake/Output this shift: No intake/output data recorded.  General appearance: alert and cooperative GI: soft, nontender RUQ drain in place, bilious output  Lab Results:  Recent Labs    09/18/19 0217 09/19/19 0158  WBC 16.1* 12.9*  HGB 8.0* 8.6*  HCT 26.6* 29.0*  PLT 176 205   BMET Recent Labs    09/18/19 0217 09/19/19 0158  NA 135 138  K 4.1 3.9  CL 104 106  CO2 20* 23  GLUCOSE 121* 139*  BUN 24* 20  CREATININE 0.93 0.95  CALCIUM 8.5* 8.5*   PT/INR Recent Labs    09/18/19 0905  LABPROT 15.1  INR 1.2   ABG No results for input(s): PHART, HCO3 in the last 72 hours.  Invalid input(s): PCO2, PO2  Studies/Results: CT HEAD WO CONTRAST  Result Date: 09/17/2019 CLINICAL DATA:  Encephalopathy. EXAM: CT HEAD WITHOUT CONTRAST TECHNIQUE: Contiguous axial images were obtained from the base of the skull through the vertex without intravenous contrast. COMPARISON:  October 02, 2012. FINDINGS: Brain: Mild diffuse cortical atrophy is noted. Mild chronic ischemic white matter disease is noted. No mass effect or midline shift is noted. Ventricular size is within normal limits. There is no evidence of mass lesion, hemorrhage or acute infarction. Vascular: No hyperdense vessel or unexpected calcification. Skull: Normal. Negative for fracture or focal lesion. Sinuses/Orbits: No acute finding. Other: None. IMPRESSION: Mild diffuse cortical atrophy. Mild chronic ischemic white matter disease. No acute intracranial abnormality seen.  Electronically Signed   By: Marijo Conception M.D.   On: 09/17/2019 11:46   NM Hepato W/EjeCT Fract  Result Date: 09/17/2019 CLINICAL DATA:  RIGHT upper quadrant pain with nausea and vomiting for 3 days EXAM: NUCLEAR MEDICINE HEPATOBILIARY IMAGING TECHNIQUE: Sequential images of the abdomen were obtained out to 60 minutes following intravenous administration of radiopharmaceutical. RADIOPHARMACEUTICALS:  5.35 mCi Tc-86m  Choletec IV Pharmaceutical: Morphine sulfate 3 mg IV COMPARISON:  Ultrasound abdomen 09/16/2019 FINDINGS: Normal tracer extraction from bloodstream indicating normal hepatocellular function. Normal excretion of tracer into biliary tree. Small bowel visualized at 14 minutes. At 1 hour, gallbladder had not visualized. Patient then received morphine and imaging was continued for an additional 30 minutes. Gallbladder failed to visualize following morphine augmentation. Findings are consistent with cystic duct obstruction and acute cholecystitis. IMPRESSION: Patent CBD. Nonvisualization of the gallbladder despite morphine augmentation consistent with cystic duct obstruction and acute cholecystitis. Electronically Signed   By: Lavonia Dana M.D.   On: 09/17/2019 14:36   IR Perc Cholecystostomy  Result Date: 09/18/2019 CLINICAL DATA:  Acute cholecystitis, currently poor operative candidate EXAM: PERCUTANEOUS CHOLECYSTOSTOMY TUBE PLACEMENT WITH ULTRASOUND AND FLUOROSCOPIC GUIDANCE FLUOROSCOPY TIME:  0.5 minutes; 197 uGym2 DAP TECHNIQUE: The procedure, risks (including but not limited to bleeding, infection, organ damage ), benefits, and alternatives were explained to the patient and family. Questions regarding the procedure were encouraged and answered. The family understands and consents to the procedure. Survey ultrasound of the abdomen was performed and an appropriate skin entry site was identified. Skin site was marked, prepped with Betadine, and  draped in usual sterile fashion, and infiltrated  locally with 1% lidocaine. Intravenous Fentanyl 156mcg and Versed 2mg  were administered as conscious sedation during continuous monitoring of the patient's level of consciousness and physiological / cardiorespiratory status by the radiology RN, with a total moderate sedation time of 10 minutes. Under real-time ultrasound guidance, gallbladder was accessed using a transhepatic approach with a 21-gauge needle. Ultrasound image documentation was saved. Bile returned through the hub. Needle was exchanged over a 018 guidewire for transitional dilator which allowed placement of 035 J wire. Over this, a 10.2 French pigtail catheter was advanced and formed centrally in the gallbladder lumen. Small contrast injection confirmed appropriate position. Catheter secured externally with 0 Prolene suture and placed external drain bag. Patient tolerated the procedure well. COMPLICATIONS: COMPLICATIONS none IMPRESSION: 1. Technically successful percutaneous cholecystostomy tube placement with ultrasound and fluoroscopic guidance. Electronically Signed   By: Lucrezia Europe M.D.   On: 09/18/2019 14:56    Anti-infectives: Anti-infectives (From admission, onward)   Start     Dose/Rate Route Frequency Ordered Stop   09/17/19 1000  hydroxychloroquine (PLAQUENIL) tablet 200 mg  Status:  Discontinued     200 mg Oral Daily 09/16/19 2340 09/16/19 2352   09/17/19 0000  piperacillin-tazobactam (ZOSYN) IVPB 3.375 g     3.375 g 12.5 mL/hr over 240 Minutes Intravenous Every 8 hours 09/16/19 2349     09/16/19 1230  piperacillin-tazobactam (ZOSYN) IVPB 3.375 g     3.375 g 100 mL/hr over 30 Minutes Intravenous  Once 09/16/19 1227 09/16/19 1347      Assessment/Plan: s/p  Continue bowel rest . Continue abx Atrial fibrillation Coronary artery disease Peripheral artery disease Rheumatoid arthritis History of CVA History of renal cell carcinoma, status post nephrectomy History of prostate cancer Hypertension Chronic kidney  disease  Acute cholecystitis with bacteremia and sepsis The patient has a positive HIDA scan consistent with acute cholecystitis along with multiple gram-negative bacteria in his blood that is almost certainly from his cholecystitis.  Given the patient's encephalopathy, frailty, multiple medical problems, bacteremia and sepsis, the patient was high risk for surgical intervention, therefore, interventional radiology was consulted for placement of a percutaneous cholecystostomy tube. This was performed 12/11.    FEN - can advance diet as tolerated VTE -Plavix on hold, Lovenox ID -[id number]  LOS: 3 days    Rosario Adie [id number]

## 2019-09-20 ENCOUNTER — Inpatient Hospital Stay (HOSPITAL_COMMUNITY): Payer: Medicare Other

## 2019-09-20 LAB — GLUCOSE, CAPILLARY
Glucose-Capillary: 143 mg/dL — ABNORMAL HIGH (ref 70–99)
Glucose-Capillary: 146 mg/dL — ABNORMAL HIGH (ref 70–99)
Glucose-Capillary: 146 mg/dL — ABNORMAL HIGH (ref 70–99)
Glucose-Capillary: 114 mg/dL — ABNORMAL HIGH (ref 70–99)
Glucose-Capillary: 105 mg/dL — ABNORMAL HIGH (ref 70–99)
Glucose-Capillary: 135 mg/dL — ABNORMAL HIGH (ref 70–99)
Glucose-Capillary: 98 mg/dL (ref 70–99)

## 2019-09-20 LAB — COMPREHENSIVE METABOLIC PANEL
ALT: 59 U/L — ABNORMAL HIGH (ref 0–44)
AST: 27 U/L (ref 15–41)
Albumin: 2.4 g/dL — ABNORMAL LOW (ref 3.5–5.0)
Alkaline Phosphatase: 76 U/L (ref 38–126)
Anion gap: 9 (ref 5–15)
BUN: 16 mg/dL (ref 8–23)
CO2: 24 mmol/L (ref 22–32)
Calcium: 8.5 mg/dL — ABNORMAL LOW (ref 8.9–10.3)
Chloride: 105 mmol/L (ref 98–111)
Creatinine, Ser: 0.83 mg/dL (ref 0.61–1.24)
GFR calc Af Amer: >60 mL/min (ref >60)
GFR calc non Af Amer: >60 mL/min (ref >60)
Glucose, Bld: 130 mg/dL — ABNORMAL HIGH (ref 70–99)
Potassium: 3.1 mmol/L — ABNORMAL LOW (ref 3.5–5.1)
Sodium: 138 mmol/L (ref 135–145)
Total Bilirubin: 0.9 mg/dL (ref 0.3–1.2)
Total Protein: 5.6 g/dL — ABNORMAL LOW (ref 6.5–8.1)

## 2019-09-20 LAB — CBC WITH DIFFERENTIAL/PLATELET
Abs Immature Granulocytes: 0.03 10*3/uL (ref 0.00–0.07)
Basophils Absolute: 0 10*3/uL (ref 0.0–0.1)
Basophils Relative: 0 %
Eosinophils Absolute: 0 10*3/uL (ref 0.0–0.5)
Eosinophils Relative: 0 %
HCT: 27.7 % — ABNORMAL LOW (ref 39.0–52.0)
Hemoglobin: 8.8 g/dL — ABNORMAL LOW (ref 13.0–17.0)
Immature Granulocytes: 0 %
Lymphocytes Relative: 6 %
Lymphs Abs: 0.5 10*3/uL — ABNORMAL LOW (ref 0.7–4.0)
MCH: 26.7 pg (ref 26.0–34.0)
MCHC: 31.8 g/dL (ref 30.0–36.0)
MCV: 83.9 fL (ref 80.0–100.0)
Monocytes Absolute: 0.4 10*3/uL (ref 0.1–1.0)
Monocytes Relative: 5 %
Neutro Abs: 7.6 10*3/uL (ref 1.7–7.7)
Neutrophils Relative %: 89 %
Platelets: 208 10*3/uL (ref 150–400)
RBC: 3.3 MIL/uL — ABNORMAL LOW (ref 4.22–5.81)
RDW: 15.9 % — ABNORMAL HIGH (ref 11.5–15.5)
WBC: 8.6 10*3/uL (ref 4.0–10.5)
nRBC: 0 % (ref 0.0–0.2)

## 2019-09-20 LAB — MAGNESIUM: Magnesium: 2.1 mg/dL (ref 1.7–2.4)

## 2019-09-20 MED ORDER — HALOPERIDOL LACTATE 5 MG/ML IJ SOLN
1.0000 mg | Freq: Four times a day (QID) | INTRAMUSCULAR | Status: DC | PRN
  Administered 2019-09-20 – 2019-09-21 (×2): 1 mg via INTRAVENOUS
  Filled 2019-09-20 (×2): qty 1

## 2019-09-20 MED ORDER — DILTIAZEM HCL 60 MG PO TABS
60.0000 mg | ORAL_TABLET | Freq: Four times a day (QID) | ORAL | Status: DC
  Filled 2019-09-20: qty 1

## 2019-09-20 MED ORDER — HYDROCORTISONE NA SUCCINATE PF 100 MG IJ SOLR
50.0000 mg | Freq: Every day | INTRAMUSCULAR | Status: DC
  Administered 2019-09-20 – 2019-09-21 (×2): 50 mg via INTRAVENOUS
  Filled 2019-09-20 (×2): qty 2

## 2019-09-20 MED ORDER — OLANZAPINE 5 MG PO TBDP
2.5000 mg | ORAL_TABLET | Freq: Two times a day (BID) | ORAL | Status: DC
  Administered 2019-09-20 (×2): 2.5 mg via ORAL
  Filled 2019-09-20 (×3): qty 0.5

## 2019-09-20 MED ORDER — LABETALOL HCL 5 MG/ML IV SOLN
10.0000 mg | INTRAVENOUS | Status: DC | PRN
  Administered 2019-09-21 – 2019-09-22 (×2): 10 mg via INTRAVENOUS
  Filled 2019-09-20 (×3): qty 4

## 2019-09-20 MED ORDER — DILTIAZEM HCL 60 MG PO TABS
60.0000 mg | ORAL_TABLET | Freq: Four times a day (QID) | ORAL | Status: DC
  Administered 2019-09-20 – 2019-09-21 (×6): 60 mg via ORAL
  Filled 2019-09-20 (×7): qty 1

## 2019-09-20 MED ORDER — LORAZEPAM 2 MG/ML IJ SOLN
1.0000 mg | Freq: Once | INTRAMUSCULAR | Status: AC
  Administered 2019-09-20: 1 mg via INTRAVENOUS
  Filled 2019-09-20: qty 1

## 2019-09-20 MED ORDER — POTASSIUM CHLORIDE 20 MEQ PO PACK
40.0000 meq | PACK | ORAL | Status: AC
  Administered 2019-09-20 (×2): 40 meq via ORAL
  Filled 2019-09-20 (×2): qty 2

## 2019-09-20 NOTE — Progress Notes (Signed)
PT Cancellation Note  Patient Details Name: Derrick [name] MRN: [id number] DOB: 10/27/1931   Cancelled Treatment:     PT attempted but deferred at request of RN - pt with increased confusion and in restraints.  Will follow.  Debe Coder PT Acute Rehabilitation Services Pager [phone] Office [phone]    [facility] 09/20/2019, 11:38 AM

## 2019-09-20 NOTE — Progress Notes (Signed)
Patient was restless and agitated the entire shift.  Patient removed 2 IV's and pulled at biliary tube.  Staff attempted to redirect patient's attention with activity vest and television.  Neither action was successful.  Restraints were ordered and placed to keep patient safe.

## 2019-09-20 NOTE — Progress Notes (Addendum)
PROGRESS NOTE    Derrick [name]  [id number] DOB: 1932/08/17 DOA: 09/16/2019 PCP: Derrick Pretty, MD    Brief Narrative:  HPI per Dr. Satira [name] is a 85 y.o. male with medical history significant of RA on prednisone and hydroxychloroquine, CAD and PAD, HTN.  Patient presented to [facility] ED with c/o periumbilical to lower abd pain, onset 3 days ago.  Associated with N/V.  No change in BM.  No urinary complaints.  Went to PCP today and referred to ED.   ED Course: In the ED TTP periumbilically and suprapubically without rebound or guarding.  He is septic with WBC 17k, Tm 101.6, HR 134.  Not hypotensive yet though.  Given 2L bolus.  LFTs noted to be significantly elevated with AST 118 and ALT 159.  COVID RT-PCR is negative.  Initial lactate 2.9, improves to 1.9 after IVF.  CT abd / pelvis: 1) lung bases are clear 2) findings worrisome for acute cholecystitis  [place] abd: neg for acute cholecystitis.  gen surg consulted: put on ABx, admit, get HIDA, and consult them if positive.  Although EDP note nor transfer note makes any mention of encephalopathy while in ED, by the time he arrives to the SDU here at [facility], he is encephalopathic.   Assessment & Plan:   Principal Problem:   Sepsis ([place]) Active Problems:   E coli bacteremia   Acute cholecystitis   Atrial fibrillation with RVR (HCC)   HTN (hypertension)   GERD (gastroesophageal reflux disease)   CAD (coronary artery disease)   Osteomyelitis (HCC)   Acute metabolic encephalopathy   A-fib (HCC)   Transaminitis   Nausea and vomiting   Rheumatoid arthritis (HCC)  #1 sepsis secondary to acute cholecystitis and E. coli bacteremia.  Patient presented with periumbilical to lower abdominal pain, fevers, nausea and vomiting.  Abdominal ultrasound done in the ED negative for acute cholecystitis.  CT abdomen and pelvis done worrisome for acute cholecystitis.  Patient noted to have a transaminitis, rapidly progressive  encephalopathy on admission, with positive blood cultures of E. coli.  Patient on IV fluids.  HIDA scan ordered and consistent with acute cholecystitis with cystic duct obstruction.  General surgical consultation obtained and general surgery recommending cholecystostomy tube placement at this time which would likely be in for about 6 to 8 weeks.  Per general surgery will likely need cardiac evaluation for preop clearance to determine whether patient is surgical candidate at that time to undergo cholecystectomy.  Patient status post cholecystostomy tube placement by IR on 09/18/2019 with bilious drainage.  LFTs trending down.  Leukocytosis trending down.  Patient with a E. coli bacteremia and sensitivities returned and sensitive to third-generation cephalosporins, quinolones, gentamicin, imipenem, Bactrim, Zosyn.  Continue empiric IV Zosyn.  Continue IV fluids.  Decreased stress dose steroids to IV Solu-Cortef 50 mg IV daily.  Patient noted to be on Plaquenil and chronic steroids for RA and steroid started due to worsening encephalopathy.  GI reviewed chart and due to HIDA scan results at this time was deferring to general surgery recommending that if patient does not improve with percutaneous drain or if LFTs continue to rise patient will need a MRCP and GI to be called back to reconsult.  GI formally consulted on patient on 09/18/2019.  IR to reassess cholecystostomy tube as patient noted to have gotten confused overnight and tube removed.  General surgery following and appreciate input and recommendations.  2.  E. coli bacteremia Likely secondary to acute cholecystitis.  Sensitivities resulted and sensitive to third-generation cephalosporins, quinolones, gentamicin, imipenem, Bactrim, Zosyn.  Slowly improving currently afebrile.  Leukocytosis trending down.  Continue empiric IV Zosyn.  Will likely need 10 to 14-day treatment of antibiotic.  3.  Rheumatoid arthritis Plaquenil and prednisone on hold.  Patient  on IV stress dose steroids.  Solu-Cortef was decreased to every 12 hours on 09/18/2019.  Decrease IV Solu-Cortef to daily.  Likely resume Plaquenil once acute infection has resolved in the outpatient setting.  4.  History of atrial fibrillation/A. fib with RVR Patient noted to go into A. fib with RVR post cholecystostomy tube placement the evening of 09/18/2019 and had to be placed on a Cardizem drip.  Heart rate was better controlled on 09/19/2019 and patient transition to oral Cardizem 30 mg p.o. every 6 hours.  Cardizem drip has been discontinued.  2D echo with a EF of 55 to 60%, mild LVH, no wall motion abnormalities, mild to moderate mitral valvular regurgitation.  Heart rate noted overnight to be in the 120s.  Increase oral Cardizem to 60 mg p.o. every 6 hours and monitor heart rate closely. Patient on chronic Plavix for coronary artery disease/PAD but not on chronic blood thinners.  Plavix on hold s/p cholecystostomy tube placement.  General surgery/IR to advise when Plavix may be resumed.   5.  Acute metabolic encephalopathy/cognitive dysfunction. Likely secondary to problem #1.  Patient may have a likely mild cognitive dysfunction at baseline.  Patient noted to have some confusion overnight pulling at lines as well as cholecystostomy tube.  Head CT negative for any acute abnormalities.  Patient with a E. coli bacteremia as well as an acute cholecystitis.  Continue empiric IV Zosyn.  Place empirically on Zyprexa.  Continue sitter and soft restraints.  Supportive care.   6.  Coronary artery disease/PAD Stable.  Plavix on hold prior to cholecystostomy tube placement per IR.  General surgery to advise when Plavix may be resumed.  Continue home regimen of aspirin, Lipitor, fish oil.  Will likely need outpatient evaluation by cardiology post discharge for preop clearance for possible cholecystectomy in about 6 to 8 weeks.  General surgery to advise when Plavix may be resumed.  7.  Hypertension Blood  pressure noted to be borderline however improved.  Patient was on a Cardizem drip and has been transitioned to oral Cardizem.  Resting heart rates noted to be in the 120s.  Increase oral Cardizem to 60 mg p.o. every 6.  Taper IV Solu-Cortef to 50 mg daily.  8.  Gastroesophageal reflux disease PPI.  9.  Hypokalemia K. Dur 40 mEq p.o. every 4 hours x2 doses.  Magnesium at 2.1.  Follow.   DVT prophylaxis: Lovenox Code Status: Full Family Communication: Updated patient.  Updated son, Prescott Gum over telephone. Disposition Plan: Remain in stepdown unit.  To be determined.   Consultants:   GI: Dr. Watt Climes 09/18/2019   General surgery: Saverio Danker, PA/Dr. Marlou Starks 09/17/2019  Procedures:   CT abdomen and pelvis 09/16/2019  Right upper quadrant ultrasound 09/16/2019  Chest x-ray 09/16/2019  HIDA scan 09/17/2019  CT head 09/17/2019  IR/ultrasound percutaneous cholecystostomy cath placement per Dr. Vernard Gambles 09/18/2019  Antimicrobials:   IV Zosyn 09/16/2019   Subjective: Patient laying in bed: Pulling at EKG leads.  Patient denies any chest pain.  No shortness of breath.  No abdominal pain.  Heart rate noted to be elevated in the 120s overnight.  Patient noted to have pulled out cholecystostomy tube last night after being confused and  agitated, stitches in place.  Tolerating clears.    Objective: Vitals:   09/20/19 0500 09/20/19 0600 09/20/19 0635 09/20/19 0729  BP: (!) 164/108 (!) 184/126 (!) 155/94   Pulse: (!) 109  (!) 106   Resp: 19 20 19    Temp:      TempSrc:      SpO2: 93%  100%   Weight:    83.3 kg  Height:        Intake/Output Summary (Last 24 hours) at 09/20/2019 0838 Last data filed at 09/20/2019 0752 Gross per 24 hour  Intake 2538.45 ml  Output 375 ml  Net 2163.45 ml   Filed Weights   09/16/19 1110 09/20/19 0729  Weight: 80.8 kg 83.3 kg    Examination:  General exam: Pleasantly confused.  Dry mucous membranes.:  Not telemetry leads.  Respiratory system:  CTA B bilaterally.  No wheezes, no crackles, no rhonchi.  Normal respiratory effort.  Cardiovascular system: Irregularly irregular.  No JVD.  No lower extremity edema.  Gastrointestinal system: Abdomen is soft, nondistended, nontender to palpation, positive bowel sounds.  No rebound.  No guarding.  Cholecystostomy tube noted with biliary drainage however stitches seem to have been removed.  Central nervous system: Alert to self only.  Moving extremities spontaneously.  Pleasantly confused. Extremities: s/p right transmetartasal amputation. Skin: No rashes, lesions or ulcers Psychiatry: Judgement and insight appear poor. Mood & affect appropriate.     Data Reviewed: I have personally reviewed following labs and imaging studies  CBC: Recent Labs  Lab 09/16/19 1113 09/17/19 0233 09/18/19 0217 09/19/19 0158 09/20/19 0214  WBC 17.6* 20.2* 16.1* 12.9* 8.6  NEUTROABS 15.0*  --  14.3* 11.3* 7.6  HGB 11.3* 10.6* 8.0* 8.6* 8.8*  HCT 36.6* 35.1* 26.6* 29.0* 27.7*  MCV 82.4 86.9 86.4 85.8 83.9  PLT 277 219 176 205 [id number]   Basic Metabolic Panel: Recent Labs  Lab 09/16/19 1113 09/17/19 0233 09/18/19 0217 09/19/19 0158 09/20/19 0214  NA 132* 135 135 138 138  K 4.2 4.5 4.1 3.9 3.1*  CL 101 103 104 106 105  CO2 22 21* 20* 23 24  GLUCOSE 184* 115* 121* 139* 130*  BUN 15 17 24* 20 16  CREATININE 1.06 1.06 0.93 0.95 0.83  CALCIUM 9.2 9.0 8.5* 8.5* 8.5*  MG  --   --   --  1.9 2.1   GFR: Estimated Creatinine Clearance: 72.7 mL/min (by C-G formula based on SCr of 0.83 mg/dL). Liver Function Tests: Recent Labs  Lab 09/16/19 1113 09/17/19 0233 09/18/19 0217 09/19/19 0158 09/20/19 0214  AST 118* 112*  111* 50* 34 27  ALT 159* 166*  167* 96* 74* 59*  ALKPHOS 112 132*  133* 102 89 76  BILITOT 1.1 1.4*  1.5* 1.0 0.6 0.9  PROT 7.0 6.6  6.6 5.4* 5.5* 5.6*  ALBUMIN 3.6 3.1*  3.1* 2.4* 2.4* 2.4*   Recent Labs  Lab 09/16/19 1113  LIPASE 24   No results for input(s): AMMONIA in  the last 168 hours. Coagulation Profile: Recent Labs  Lab 09/18/19 0905  INR 1.2   Cardiac Enzymes: No results for input(s): CKTOTAL, CKMB, CKMBINDEX, TROPONINI in the last 168 hours. BNP (last 3 results) No results for input(s): PROBNP in the last 8760 hours. HbA1C: No results for input(s): HGBA1C in the last 72 hours. CBG: Recent Labs  Lab 09/19/19 1551 09/19/19 1956 09/20/19 0050 09/20/19 0436 09/20/19 0735  GLUCAP 122* 119* 146* 146* 143*   Lipid Profile: No results for input(s):  CHOL, HDL, LDLCALC, TRIG, CHOLHDL, LDLDIRECT in the last 72 hours. Thyroid Function Tests: No results for input(s): TSH, T4TOTAL, FREET4, T3FREE, THYROIDAB in the last 72 hours. Anemia Panel: No results for input(s): VITAMINB12, FOLATE, FERRITIN, TIBC, IRON, RETICCTPCT in the last 72 hours. Sepsis Labs: Recent Labs  Lab 09/16/19 1239 09/16/19 1446 09/17/19 0002 09/17/19 0233  LATICACIDVEN 2.9* 1.9 1.5 1.4    Recent Results (from the past 240 hour(s))  Blood culture (routine x 2)     Status: Abnormal   Collection Time: 09/16/19 12:15 PM   Specimen: Right Antecubital; Blood  Result Value Ref Range Status   Specimen Description   Final    RIGHT ANTECUBITAL Performed at [facility], [address] [place], [place] [address]    Special Requests   Final    BOTTLES DRAWN AEROBIC AND ANAEROBIC Blood Culture adequate volume Performed at [facility], [address] [place], [place] [address]    Culture  Setup Time   Final    IN BOTH AEROBIC AND ANAEROBIC BOTTLES GRAM NEGATIVE RODS CRITICAL VALUE NOTED.  VALUE IS CONSISTENT WITH PREVIOUSLY REPORTED AND CALLED VALUE.    Culture (A)  Final    ESCHERICHIA COLI SUSCEPTIBILITIES PERFORMED ON PREVIOUS CULTURE WITHIN THE LAST 5 DAYS. Performed at [facility], [place] [address], [place], [place] [address]    Report Status 09/19/2019 FINAL  Final  Blood culture (routine x 2)     Status: Abnormal   Collection Time:  09/16/19 12:30 PM   Specimen: BLOOD LEFT WRIST  Result Value Ref Range Status   Specimen Description   Final    BLOOD LEFT WRIST Performed at [facility], [address] [place], [place] [address]    Special Requests   Final    BOTTLES DRAWN AEROBIC AND ANAEROBIC Blood Culture adequate volume Performed at [facility], [address] [place], [place] [address]    Culture  Setup Time   Final    IN BOTH AEROBIC AND ANAEROBIC BOTTLES GRAM NEGATIVE RODS CRITICAL RESULT CALLED TO, READ BACK BY AND VERIFIED WITH: B GREEN PHARMD 09/17/19 0428 JDW Performed at [facility], [place] [address] [place], [place] [address]    Culture ESCHERICHIA COLI (A)  Final   Report Status 09/19/2019 FINAL  Final   Organism ID, Bacteria ESCHERICHIA COLI  Final      Susceptibility   Escherichia coli - MIC*    AMPICILLIN >=32 RESISTANT Resistant     CEFAZOLIN >=64 RESISTANT Resistant     CEFEPIME <=1 SENSITIVE Sensitive     CEFTAZIDIME <=1 SENSITIVE Sensitive     CEFTRIAXONE <=1 SENSITIVE Sensitive     CIPROFLOXACIN <=0.25 SENSITIVE Sensitive     GENTAMICIN <=1 SENSITIVE Sensitive     IMIPENEM <=0.25 SENSITIVE Sensitive     TRIMETH/SULFA <=20 SENSITIVE Sensitive     AMPICILLIN/SULBACTAM >=32 RESISTANT Resistant     PIP/TAZO 8 SENSITIVE Sensitive     * ESCHERICHIA COLI  Blood Culture ID Panel (Reflexed)     Status: Abnormal   Collection Time: 09/16/19 12:30 PM  Result Value Ref Range Status   Enterococcus species NOT DETECTED NOT DETECTED Final   Listeria monocytogenes NOT DETECTED NOT DETECTED Final   Staphylococcus species NOT DETECTED NOT DETECTED Final   Staphylococcus aureus (BCID) NOT DETECTED NOT DETECTED Final   Streptococcus species NOT DETECTED NOT DETECTED Final   Streptococcus agalactiae NOT DETECTED NOT DETECTED  Final   Streptococcus pneumoniae NOT DETECTED NOT DETECTED Final   Streptococcus pyogenes NOT DETECTED NOT DETECTED Final   Acinetobacter baumannii  NOT DETECTED NOT DETECTED Final   Enterobacteriaceae species DETECTED (A) NOT DETECTED Final    Comment: Enterobacteriaceae represent a large family of gram-negative bacteria, not a single organism. CRITICAL RESULT CALLED TO, READ BACK BY AND VERIFIED WITH: B GREEN PHARMD 09/17/19 0428 JDW    Enterobacter cloacae complex NOT DETECTED NOT DETECTED Final   Escherichia coli DETECTED (A) NOT DETECTED Final    Comment: CRITICAL RESULT CALLED TO, READ BACK BY AND VERIFIED WITH: B GREEN PHARMD 09/17/19 0428 JDW    Klebsiella oxytoca NOT DETECTED NOT DETECTED Final   Klebsiella pneumoniae NOT DETECTED NOT DETECTED Final   Proteus species NOT DETECTED NOT DETECTED Final   Serratia marcescens NOT DETECTED NOT DETECTED Final   Carbapenem resistance NOT DETECTED NOT DETECTED Final   Haemophilus influenzae NOT DETECTED NOT DETECTED Final   Neisseria meningitidis NOT DETECTED NOT DETECTED Final   Pseudomonas aeruginosa NOT DETECTED NOT DETECTED Final   Candida albicans NOT DETECTED NOT DETECTED Final   Candida glabrata NOT DETECTED NOT DETECTED Final   Candida krusei NOT DETECTED NOT DETECTED Final   Candida parapsilosis NOT DETECTED NOT DETECTED Final   Candida tropicalis NOT DETECTED NOT DETECTED Final    Comment: Performed at [facility], [place] [address], [place], [place] [address]  SARS CORONAVIRUS 2 (TAT 6-24 HRS) Nasopharyngeal Nasopharyngeal Swab     Status: None   Collection Time: 09/16/19 12:50 PM   Specimen: Nasopharyngeal Swab  Result Value Ref Range Status   SARS Coronavirus 2 NEGATIVE NEGATIVE Final    Comment: (NOTE) SARS-CoV-2 target nucleic acids are NOT DETECTED. The SARS-CoV-2 RNA is generally detectable in upper and lower respiratory specimens during the acute phase of infection. Negative results do not preclude SARS-CoV-2 infection, do not rule out co-infections with other pathogens, and should not be used as the sole basis for treatment or other patient management  decisions. Negative results must be combined with clinical observations, patient history, and epidemiological information. The expected result is Negative. Fact Sheet for Patients: SugarRoll.be Fact Sheet for Healthcare Providers: https://www.woods-mathews.com/ This test is not yet approved or cleared by the [place] FDA and  has been authorized for detection and/or diagnosis of SARS-CoV-2 by FDA under an Emergency Use Authorization (EUA). This EUA will remain  in effect (meaning this test can be used) for the duration of the COVID-19 declaration under Section 56 4(b)(1) of the Act, 21 U.S.C. section 360bbb-3(b)(1), unless the authorization is terminated or revoked sooner. Performed at [facility], [place] [address], [place], [place] [address]   MRSA PCR Screening     Status: None   Collection Time: 09/16/19 11:34 PM   Specimen: Nasal Mucosa; Nasopharyngeal  Result Value Ref Range Status   MRSA by PCR NEGATIVE NEGATIVE Final    Comment:        The GeneXpert MRSA Assay (FDA approved for NASAL specimens only), is one component of a comprehensive MRSA colonization surveillance program. It is not intended to diagnose MRSA infection nor to guide or monitor treatment for MRSA infections. Performed at [facility], [place] [address], [place], [place] [address]          Radiology Studies: IR Perc Cholecystostomy  Result Date: 09/18/2019 CLINICAL DATA:  Acute cholecystitis, currently poor operative candidate EXAM: PERCUTANEOUS CHOLECYSTOSTOMY TUBE PLACEMENT WITH ULTRASOUND AND FLUOROSCOPIC GUIDANCE FLUOROSCOPY TIME:  0.5 minutes; 197 uGym2 DAP TECHNIQUE: The procedure, risks (including but not limited to bleeding, infection, organ damage ), benefits, and alternatives were explained to the patient and family. Questions regarding the procedure were encouraged and answered. The family understands and consents to the  procedure. Survey ultrasound of the abdomen was performed and an appropriate skin entry site was identified. Skin site was marked, prepped with Betadine, and draped in usual sterile fashion, and infiltrated locally with 1% lidocaine. Intravenous Fentanyl 128mcg and Versed 2mg  were administered as conscious sedation during continuous monitoring of the patient's level of consciousness and physiological / cardiorespiratory status by the radiology RN, with a total moderate sedation time of 10 minutes. Under real-time ultrasound guidance, gallbladder was accessed using a transhepatic approach with a 21-gauge needle. Ultrasound image documentation was saved. Bile returned through the hub. Needle was exchanged over a 018 guidewire for transitional dilator which allowed placement of 035 J wire. Over this, a 10.2 French pigtail catheter was advanced and formed centrally in the gallbladder lumen. Small contrast injection confirmed appropriate position. Catheter secured externally with 0 Prolene suture and placed external drain bag. Patient tolerated the procedure well. COMPLICATIONS: COMPLICATIONS none IMPRESSION: 1. Technically successful percutaneous cholecystostomy tube placement with ultrasound and fluoroscopic guidance. Electronically Signed   By: Lucrezia Europe M.D.   On: 09/18/2019 14:56   ECHOCARDIOGRAM COMPLETE  Result Date: 09/19/2019   ECHOCARDIOGRAM REPORT   Patient Name:   TRAVARIUS BACHER Date of Exam: 09/19/2019 Medical Rec #:  [id number]   Height:       72.0 in Accession #:    [id number]  Weight:       178.1 lb Date of Birth:  [birth year]  BSA:          2.03 m Patient Age:    33 years    BP:           137/69 mmHg Patient Gender: M           HR:           94 bpm. Exam Location:  Inpatient Procedure: 2D Echo Indications:    Atrial Fibrillation 427.31 / I48.91  History:        Patient has prior history of Echocardiogram examinations, most                 recent 12/12/2018. CAD; Risk Factors:Hypertension. AKI                  Sepsis.  Sonographer:    Vikki Ports Turrentine Referring Phys: [id number] Tre[address] Ashkar V Haniyah Maciolek  Sonographer Comments: Suboptimal subcostal window due to patient body habitus. IMPRESSIONS  1. Left ventricular ejection fraction, by visual estimation, is 55 to 60%. The left ventricle has normal function. There is mildly increased left ventricular hypertrophy.  2. Left ventricular diastolic parameters are indeterminate.  3. The left ventricle has no regional wall motion abnormalities.  4. Global right ventricle has normal systolic function.The right ventricular size is normal. No increase in right ventricular wall thickness.  5. Left atrial size was moderately dilated.  6. Right atrial size was normal.  7. The mitral valve is normal in structure. Mild to moderate mitral valve regurgitation.  8. Very eccentric anterior directed MR jet. The degree of MR may be underestimated due to the eccentrity of the jet. The MV to AV VTI ratio is 0.6 suggesting mild MR.  9. The tricuspid valve is normal in structure. Tricuspid valve regurgitation is trivial. 10. The aortic valve is tricuspid.  Aortic valve regurgitation is not visualized. No evidence of aortic valve sclerosis or stenosis. 11. Pulmonic regurgitation is mild. 12. The pulmonic valve was not well visualized. Pulmonic valve regurgitation is mild. 13. The interatrial septum was not well visualized. FINDINGS  Left Ventricle: Left ventricular ejection fraction, by visual estimation, is 55 to 60%. The left ventricle has normal function. The left ventricle has no regional wall motion abnormalities. There is mildly increased left ventricular hypertrophy. Left ventricular diastolic parameters are indeterminate. Right Ventricle: The right ventricular size is normal. No increase in right ventricular wall thickness. Global RV systolic function is has normal systolic function. Left Atrium: Left atrial size was moderately dilated. Right Atrium: Right atrial size was normal in size Pericardium:  There is no evidence of pericardial effusion. Mitral Valve: The mitral valve is normal in structure. Mild to moderate mitral valve regurgitation. Very eccentric anterior directed MR jet. The degree of MR may be underestimated due to the eccentrity of the jet. The MV to AV VTI ratio is 0.6 suggesting  mild MR. Tricuspid Valve: The tricuspid valve is normal in structure. Tricuspid valve regurgitation is trivial. Aortic Valve: The aortic valve is tricuspid. Aortic valve regurgitation is not visualized. The aortic valve is structurally normal, with no evidence of sclerosis or stenosis. Aortic valve mean gradient measures 7.3 mmHg. Aortic valve peak gradient measures 13.7 mmHg. Aortic valve area, by VTI measures 1.77 cm. Pulmonic Valve: The pulmonic valve was not well visualized. Pulmonic valve regurgitation is mild. Pulmonic regurgitation is mild. No evidence of pulmonic stenosis. Aorta: The aortic root is normal in size and structure. Pulmonary Artery: Indeterminate PASP, inadequate TR jet. Venous: The inferior vena cava was not well visualized. IAS/Shunts: The interatrial septum was not well visualized.  LEFT VENTRICLE PLAX 2D LVIDd:         4.50 cm LVIDs:         3.30 cm LV PW:         1.20 cm LV IVS:        1.20 cm LVOT diam:     2.10 cm LV SV:         48 ml LV SV Index:   23.80 LVOT Area:     3.46 cm  RIGHT VENTRICLE RV S prime:     16.00 cm/s TAPSE (M-mode): 1.6 cm LEFT ATRIUM              Index       RIGHT ATRIUM           Index LA diam:        4.60 cm  2.27 cm/m  RA Area:     13.90 cm LA Vol (A2C):   103.0 ml 50.79 ml/m RA Volume:   27.40 ml  13.51 ml/m LA Vol (A4C):   52.3 ml  25.79 ml/m LA Biplane Vol: 78.9 ml  38.90 ml/m  AORTIC VALVE AV Area (Vmax):    1.72 cm AV Area (Vmean):   1.81 cm AV Area (VTI):     1.77 cm AV Vmax:           185.22 cm/s AV Vmean:          124.794 cm/s AV VTI:            0.352 m AV Peak Grad:      13.7 mmHg AV Mean Grad:      7.3 mmHg LVOT Vmax:         92.03 cm/s LVOT  Vmean:  65.200 cm/s LVOT VTI:          0.180 m LVOT/AV VTI ratio: 0.51  AORTA Ao Root diam: 3.40 cm MITRAL VALVE                        TRICUSPID VALVE MV Area (PHT): 3.91 cm             TR Peak grad:   13.4 mmHg MV VTI:        0.21 m               TR Vmax:        183.00 cm/s MV PHT:        56.26 msec MV Decel Time: 194 msec             SHUNTS MV E velocity: 84.27 cm/s 103 cm/s  Systemic VTI:  0.18 m MV A velocity: 79.70 cm/s 70.3 cm/s Systemic Diam: 2.10 cm MV E/A ratio:  1.06       1.5  Carlyle Dolly MD Electronically signed by Carlyle Dolly MD Signature Date/Time: 09/19/2019/12:26:36 PM    Final         Scheduled Meds: . aspirin  81 mg Oral Daily  . atorvastatin  40 mg Oral Daily  . Chlorhexidine Gluconate Cloth  6 each Topical Daily  . diltiazem  60 mg Oral Q6H  . docusate sodium  250 mg Oral Daily  . enoxaparin (LOVENOX) injection  40 mg Subcutaneous Daily  . finasteride  5 mg Oral QPM  . hydrocortisone sod succinate (SOLU-CORTEF) inj  50 mg Intravenous Daily  . insulin aspart  0-9 Units Subcutaneous Q4H  . mouth rinse  15 mL Mouth Rinse BID  . omega-3 acid ethyl esters  1 g Oral Daily  . pantoprazole  40 mg Oral BID  . potassium chloride  40 mEq Oral Q4H  . sertraline  25 mg Oral Daily  . sodium chloride flush  5 mL Intracatheter Q8H   Continuous Infusions: . sodium chloride Stopped (09/16/19 1347)  . lactated ringers 75 mL/hr at 09/20/19 0754  . piperacillin-tazobactam (ZOSYN)  IV 3.375 g (09/20/19 0804)     LOS: 4 days    Time spent: 42 minutes    Irine Seal, MD [facility]  If 7PM-7AM, please contact night-coverage [email or web address] 09/20/2019, 8:38 AM

## 2019-09-20 NOTE — Progress Notes (Signed)
Pharmacy Antibiotic Note  Derrick [name] is a 85 y.o. male admitted on 09/16/2019 with intra-abdominal infection.  Pharmacy has been consulted for zosyn dosing.  Plan: Zosyn 3.375g IV q8h (4 hour infusion).  F/u scr/cultures   Dosage will likely  remain stable at above dosage and need for further dosage adjustment appears unlikely at present.    Will sign off at this time.  Please reconsult if a change in clinical status warrants re-evaluation of dosage.     Height: 6' (182.9 cm) Weight: 183 lb 10.3 oz (83.3 kg) IBW/kg (Calculated) : 77.6  Temp (24hrs), Avg:98.2 F (36.8 C), Min:97.9 F (36.6 C), Max:98.7 F (37.1 C)  Recent Labs  Lab 09/16/19 1113 09/16/19 1239 09/16/19 1446 09/17/19 0002 09/17/19 0233 09/18/19 0217 09/19/19 0158 09/20/19 0214  WBC 17.6*  --   --   --  20.2* 16.1* 12.9* 8.6  CREATININE 1.06  --   --   --  1.06 0.93 0.95 0.83  LATICACIDVEN  --  2.9* 1.9 1.5 1.4  --   --   --     Estimated Creatinine Clearance: 72.7 mL/min (by C-G formula based on SCr of 0.83 mg/dL).    Allergies  Allergen Reactions  . Effexor [Venlafaxine] Other (See Comments)    Sexual side affects  . Lisinopril Cough  . Paxil [Paroxetine] Other (See Comments)    Sexual side affects  . Viagra [Sildenafil] Other (See Comments)    Chest discomfort  . Wellbutrin [Bupropion] Itching    Antimicrobials this admission: 12/9 zosyn x 1  12/10>>>   Dose adjustments this admission:   Microbiology results: 12/9 MRSA PCR: negative  12/10: BCID 4 of 4 bottles e-coli ( R to am, Unasyn and cefazolin)  Thank you for allowing pharmacy to be a part of this patient's care.  Royetta Asal, PharmD, BCPS 09/20/2019 10:06 AM

## 2019-09-20 NOTE — Progress Notes (Addendum)
Pt is in three point restraints with a safety sitter and is continually trying to get out of bed, rip off restraints, and states he is leaving. PRN haldol given with no change in behavior. MD notified. Will continue to monitor.   Order for PRN ativan received and medication given. Pt's BP and HR are increasing due to pts agitation. MD notified. Will continue to monitor.

## 2019-09-20 NOTE — Progress Notes (Signed)
Subjective: No complaints.  Pulled out chole tube some last night.  RN staff able to partially put it back in  Objective: Vital signs in last 24 hours: Temp:  [97.9 F (36.6 C)-98.3 F (36.8 C)] 98 F (36.7 C) (12/13 0410) Pulse Rate:  [68-126] 106 (12/13 0635) Resp:  [12-23] 19 (12/13 0635) BP: (113-184)/(64-126) 155/94 (12/13 0635) SpO2:  [82 %-100 %] 100 % (12/13 0635)    Intake/Output from previous day: 12/12 0701 - 12/13 0700 In: 2538.5 [P.O.:350; I.V.:1984.6; IV Piggyback:193.9] Out: 375 [Urine:100; Drains:275] Intake/Output this shift: No intake/output data recorded.  General appearance: alert and cooperative GI: soft, nontender RUQ drain in place, bilious output  Lab Results:  Recent Labs    09/19/19 0158 09/20/19 0214  WBC 12.9* 8.6  HGB 8.6* 8.8*  HCT 29.0* 27.7*  PLT 205 208   BMET Recent Labs    09/19/19 0158 09/20/19 0214  NA 138 138  K 3.9 3.1*  CL 106 105  CO2 23 24  GLUCOSE 139* 130*  BUN 20 16  CREATININE 0.95 0.83  CALCIUM 8.5* 8.5*   PT/INR Recent Labs    09/18/19 0905  LABPROT 15.1  INR 1.2   ABG No results for input(s): PHART, HCO3 in the last 72 hours.  Invalid input(s): PCO2, PO2  Studies/Results: IR Perc Cholecystostomy  Result Date: 09/18/2019 CLINICAL DATA:  Acute cholecystitis, currently poor operative candidate EXAM: PERCUTANEOUS CHOLECYSTOSTOMY TUBE PLACEMENT WITH ULTRASOUND AND FLUOROSCOPIC GUIDANCE FLUOROSCOPY TIME:  0.5 minutes; 197 uGym2 DAP TECHNIQUE: The procedure, risks (including but not limited to bleeding, infection, organ damage ), benefits, and alternatives were explained to the patient and family. Questions regarding the procedure were encouraged and answered. The family understands and consents to the procedure. Survey ultrasound of the abdomen was performed and an appropriate skin entry site was identified. Skin site was marked, prepped with Betadine, and draped in usual sterile fashion, and  infiltrated locally with 1% lidocaine. Intravenous Fentanyl 144mcg and Versed 2mg  were administered as conscious sedation during continuous monitoring of the patient's level of consciousness and physiological / cardiorespiratory status by the radiology RN, with a total moderate sedation time of 10 minutes. Under real-time ultrasound guidance, gallbladder was accessed using a transhepatic approach with a 21-gauge needle. Ultrasound image documentation was saved. Bile returned through the hub. Needle was exchanged over a 018 guidewire for transitional dilator which allowed placement of 035 J wire. Over this, a 10.2 French pigtail catheter was advanced and formed centrally in the gallbladder lumen. Small contrast injection confirmed appropriate position. Catheter secured externally with 0 Prolene suture and placed external drain bag. Patient tolerated the procedure well. COMPLICATIONS: COMPLICATIONS none IMPRESSION: 1. Technically successful percutaneous cholecystostomy tube placement with ultrasound and fluoroscopic guidance. Electronically Signed   By: Lucrezia Europe M.D.   On: 09/18/2019 14:56   ECHOCARDIOGRAM COMPLETE  Result Date: 09/19/2019   ECHOCARDIOGRAM REPORT   Patient Name:   Derrick [name] Date of Exam: 09/19/2019 Medical Rec #:  [id number]   Height:       72.0 in Accession #:    [id number]  Weight:       178.1 lb Date of Birth:  Oct 30, 1931  BSA:          2.03 m Patient Age:    85 years    BP:           137/69 mmHg Patient Gender: M           HR:  94 bpm. Exam Location:  Inpatient Procedure: 2D Echo Indications:    Atrial Fibrillation 427.31 / I48.91  History:        Patient has prior history of Echocardiogram examinations, most                 recent 12/12/2018. CAD; Risk Factors:Hypertension. AKI                 Sepsis.  Sonographer:    Vikki Ports Turrentine Referring Phys: [id number] DANIEL V THOMPSON  Sonographer Comments: Suboptimal subcostal window due to patient body habitus. IMPRESSIONS  1. Left  ventricular ejection fraction, by visual estimation, is 55 to 60%. The left ventricle has normal function. There is mildly increased left ventricular hypertrophy.  2. Left ventricular diastolic parameters are indeterminate.  3. The left ventricle has no regional wall motion abnormalities.  4. Global right ventricle has normal systolic function.The right ventricular size is normal. No increase in right ventricular wall thickness.  5. Left atrial size was moderately dilated.  6. Right atrial size was normal.  7. The mitral valve is normal in structure. Mild to moderate mitral valve regurgitation.  8. Very eccentric anterior directed MR jet. The degree of MR may be underestimated due to the eccentrity of the jet. The MV to AV VTI ratio is 0.6 suggesting mild MR.  9. The tricuspid valve is normal in structure. Tricuspid valve regurgitation is trivial. 10. The aortic valve is tricuspid. Aortic valve regurgitation is not visualized. No evidence of aortic valve sclerosis or stenosis. 11. Pulmonic regurgitation is mild. 12. The pulmonic valve was not well visualized. Pulmonic valve regurgitation is mild. 13. The interatrial septum was not well visualized. FINDINGS  Left Ventricle: Left ventricular ejection fraction, by visual estimation, is 55 to 60%. The left ventricle has normal function. The left ventricle has no regional wall motion abnormalities. There is mildly increased left ventricular hypertrophy. Left ventricular diastolic parameters are indeterminate. Right Ventricle: The right ventricular size is normal. No increase in right ventricular wall thickness. Global RV systolic function is has normal systolic function. Left Atrium: Left atrial size was moderately dilated. Right Atrium: Right atrial size was normal in size Pericardium: There is no evidence of pericardial effusion. Mitral Valve: The mitral valve is normal in structure. Mild to moderate mitral valve regurgitation. Very eccentric anterior directed MR jet.  The degree of MR may be underestimated due to the eccentrity of the jet. The MV to AV VTI ratio is 0.6 suggesting  mild MR. Tricuspid Valve: The tricuspid valve is normal in structure. Tricuspid valve regurgitation is trivial. Aortic Valve: The aortic valve is tricuspid. Aortic valve regurgitation is not visualized. The aortic valve is structurally normal, with no evidence of sclerosis or stenosis. Aortic valve mean gradient measures 7.3 mmHg. Aortic valve peak gradient measures 13.7 mmHg. Aortic valve area, by VTI measures 1.77 cm. Pulmonic Valve: The pulmonic valve was not well visualized. Pulmonic valve regurgitation is mild. Pulmonic regurgitation is mild. No evidence of pulmonic stenosis. Aorta: The aortic root is normal in size and structure. Pulmonary Artery: Indeterminate PASP, inadequate TR jet. Venous: The inferior vena cava was not well visualized. IAS/Shunts: The interatrial septum was not well visualized.  LEFT VENTRICLE PLAX 2D LVIDd:         4.50 cm LVIDs:         3.30 cm LV PW:         1.20 cm LV IVS:        1.20 cm LVOT diam:  2.10 cm LV SV:         48 ml LV SV Index:   23.80 LVOT Area:     3.46 cm  RIGHT VENTRICLE RV S prime:     16.00 cm/s TAPSE (M-mode): 1.6 cm LEFT ATRIUM              Index       RIGHT ATRIUM           Index LA diam:        4.60 cm  2.27 cm/m  RA Area:     13.90 cm LA Vol (A2C):   103.0 ml 50.79 ml/m RA Volume:   27.40 ml  13.51 ml/m LA Vol (A4C):   52.3 ml  25.79 ml/m LA Biplane Vol: 78.9 ml  38.90 ml/m  AORTIC VALVE AV Area (Vmax):    1.72 cm AV Area (Vmean):   1.81 cm AV Area (VTI):     1.77 cm AV Vmax:           185.22 cm/s AV Vmean:          124.794 cm/s AV VTI:            0.352 m AV Peak Grad:      13.7 mmHg AV Mean Grad:      7.3 mmHg LVOT Vmax:         92.03 cm/s LVOT Vmean:        65.200 cm/s LVOT VTI:          0.180 m LVOT/AV VTI ratio: 0.51  AORTA Ao Root diam: 3.40 cm MITRAL VALVE                        TRICUSPID VALVE MV Area (PHT): 3.91 cm              TR Peak grad:   13.4 mmHg MV VTI:        0.21 m               TR Vmax:        183.00 cm/s MV PHT:        56.26 msec MV Decel Time: 194 msec             SHUNTS MV E velocity: 84.27 cm/s 103 cm/s  Systemic VTI:  0.18 m MV A velocity: 79.70 cm/s 70.3 cm/s Systemic Diam: 2.10 cm MV E/A ratio:  1.06       1.5  Carlyle Dolly MD Electronically signed by Carlyle Dolly MD Signature Date/Time: 09/19/2019/12:26:36 PM    Final     Anti-infectives: Anti-infectives (From admission, onward)   Start     Dose/Rate Route Frequency Ordered Stop   09/17/19 1000  hydroxychloroquine (PLAQUENIL) tablet 200 mg  Status:  Discontinued     200 mg Oral Daily 09/16/19 2340 09/16/19 2352   09/17/19 0000  piperacillin-tazobactam (ZOSYN) IVPB 3.375 g     3.375 g 12.5 mL/hr over 240 Minutes Intravenous Every 8 hours 09/16/19 2349     09/16/19 1230  piperacillin-tazobactam (ZOSYN) IVPB 3.375 g     3.375 g 100 mL/hr over 30 Minutes Intravenous  Once 09/16/19 1227 09/16/19 1347      Assessment/Plan: s/p  Continue bowel rest . Continue abx Atrial fibrillation Coronary artery disease Peripheral artery disease Rheumatoid arthritis History of CVA History of renal cell carcinoma, status post nephrectomy History of prostate cancer Hypertension Chronic kidney disease  Acute cholecystitis with bacteremia and sepsis The  patient has a positive HIDA scan consistent with acute cholecystitis along with multiple gram-negative bacteria in his blood that is almost certainly from his cholecystitis.  Given the patient's encephalopathy, frailty, multiple medical problems, bacteremia and sepsis, the patient was high risk for surgical intervention, therefore, interventional radiology was consulted for placement of a percutaneous cholecystostomy tube. This was performed 12/11.   Pt got confused and pulled out the tube on night of 12/12.  Currently still draining well but stitch has come loose at the skin.  Will defer further  management to IR since tube is still working.    FEN - can advance diet as tolerated VTE -Plavix on hold, Lovenox ID -[email or web address]  LOS: 4 days    Rosario Adie [phone]

## 2019-09-21 LAB — CBC WITH DIFFERENTIAL/PLATELET
Abs Immature Granulocytes: 0.05 10*3/uL (ref 0.00–0.07)
Basophils Absolute: 0 10*3/uL (ref 0.0–0.1)
Basophils Relative: 0 %
Eosinophils Absolute: 0.1 10*3/uL (ref 0.0–0.5)
Eosinophils Relative: 1 %
HCT: 24.6 % — ABNORMAL LOW (ref 39.0–52.0)
Hemoglobin: 7.9 g/dL — ABNORMAL LOW (ref 13.0–17.0)
Immature Granulocytes: 1 %
Lymphocytes Relative: 13 %
Lymphs Abs: 1.1 10*3/uL (ref 0.7–4.0)
MCH: 26 pg (ref 26.0–34.0)
MCHC: 32.1 g/dL (ref 30.0–36.0)
MCV: 80.9 fL (ref 80.0–100.0)
Monocytes Absolute: 0.9 10*3/uL (ref 0.1–1.0)
Monocytes Relative: 10 %
Neutro Abs: 6.7 10*3/uL (ref 1.7–7.7)
Neutrophils Relative %: 75 %
Platelets: 199 10*3/uL (ref 150–400)
RBC: 3.04 MIL/uL — ABNORMAL LOW (ref 4.22–5.81)
RDW: 15.9 % — ABNORMAL HIGH (ref 11.5–15.5)
WBC: 9.6 10*3/uL (ref 4.0–10.5)
nRBC: 0 % (ref 0.0–0.2)

## 2019-09-21 LAB — GLUCOSE, CAPILLARY
Glucose-Capillary: 107 mg/dL — ABNORMAL HIGH (ref 70–99)
Glucose-Capillary: 107 mg/dL — ABNORMAL HIGH (ref 70–99)
Glucose-Capillary: 112 mg/dL — ABNORMAL HIGH (ref 70–99)
Glucose-Capillary: 86 mg/dL (ref 70–99)
Glucose-Capillary: 86 mg/dL (ref 70–99)
Glucose-Capillary: 93 mg/dL (ref 70–99)

## 2019-09-21 LAB — COMPREHENSIVE METABOLIC PANEL
ALT: 51 U/L — ABNORMAL HIGH (ref 0–44)
AST: 30 U/L (ref 15–41)
Albumin: 2.4 g/dL — ABNORMAL LOW (ref 3.5–5.0)
Alkaline Phosphatase: 65 U/L (ref 38–126)
Anion gap: 9 (ref 5–15)
BUN: 11 mg/dL (ref 8–23)
CO2: 25 mmol/L (ref 22–32)
Calcium: 8.5 mg/dL — ABNORMAL LOW (ref 8.9–10.3)
Chloride: 108 mmol/L (ref 98–111)
Creatinine, Ser: 0.86 mg/dL (ref 0.61–1.24)
GFR calc Af Amer: >60 mL/min (ref >60)
GFR calc non Af Amer: >60 mL/min (ref >60)
Glucose, Bld: 88 mg/dL (ref 70–99)
Potassium: 2.8 mmol/L — ABNORMAL LOW (ref 3.5–5.1)
Sodium: 142 mmol/L (ref 135–145)
Total Bilirubin: 1 mg/dL (ref 0.3–1.2)
Total Protein: 5.4 g/dL — ABNORMAL LOW (ref 6.5–8.1)

## 2019-09-21 LAB — POTASSIUM: Potassium: 4.1 mmol/L (ref 3.5–5.1)

## 2019-09-21 MED ORDER — PREDNISONE 20 MG PO TABS
60.0000 mg | ORAL_TABLET | Freq: Every day | ORAL | Status: DC
  Administered 2019-09-22 – 2019-09-23 (×2): 60 mg via ORAL
  Filled 2019-09-21 (×2): qty 3

## 2019-09-21 MED ORDER — POTASSIUM CHLORIDE IN NACL 40-0.9 MEQ/L-% IV SOLN
INTRAVENOUS | Status: DC
  Administered 2019-09-21 – 2019-09-25 (×8): 75 mL/h via INTRAVENOUS
  Filled 2019-09-21 (×9): qty 1000

## 2019-09-21 MED ORDER — OLANZAPINE 5 MG PO TBDP
5.0000 mg | ORAL_TABLET | Freq: Two times a day (BID) | ORAL | Status: DC
  Administered 2019-09-21 – 2019-09-24 (×7): 5 mg via ORAL
  Filled 2019-09-21 (×9): qty 1

## 2019-09-21 MED ORDER — POTASSIUM CHLORIDE 10 MEQ/100ML IV SOLN
10.0000 meq | INTRAVENOUS | Status: AC
  Administered 2019-09-21 (×4): 10 meq via INTRAVENOUS
  Filled 2019-09-21 (×3): qty 100

## 2019-09-21 MED ORDER — POTASSIUM CHLORIDE 20 MEQ PO PACK
40.0000 meq | PACK | ORAL | Status: AC
  Administered 2019-09-21 (×2): 40 meq via ORAL
  Filled 2019-09-21 (×2): qty 2

## 2019-09-21 MED ORDER — SODIUM CHLORIDE 0.9% FLUSH: 10.0000 mL | INTRAVENOUS | Status: DC | PRN

## 2019-09-21 MED ORDER — SODIUM CHLORIDE 0.9 % IV SOLN: INTRAVENOUS | Status: DC

## 2019-09-21 NOTE — Progress Notes (Signed)
PROGRESS NOTE    [name]  [id number] DOB: 1932-09-12 DOA: 09/16/2019 PCP: [name], MD    Brief Narrative:  HPI per [name] is a 85 y.o. male with medical history significant of RA on prednisone and hydroxychloroquine, CAD and PAD, HTN.  Patient presented to [facility] ED with c/o periumbilical to lower abd pain, onset 3 days ago.  Associated with N/V.  No change in BM.  No urinary complaints.  Went to PCP today and referred to ED.   ED Course: In the ED TTP periumbilically and suprapubically without rebound or guarding.  He is septic with WBC 17k, Tm 101.6, HR 134.  Not hypotensive yet though.  Given 2L bolus.  LFTs noted to be significantly elevated with AST 118 and ALT 159.  COVID RT-PCR is negative.  Initial lactate 2.9, improves to 1.9 after IVF.  CT abd / pelvis: 1) lung bases are clear 2) findings worrisome for acute cholecystitis  [place] abd: neg for acute cholecystitis.  gen surg consulted: put on ABx, admit, get HIDA, and consult them if positive.  Although EDP note nor transfer note makes any mention of encephalopathy while in ED, by the time he arrives to the SDU here at [facility], he is encephalopathic.   Assessment & Plan:   Principal Problem:   Sepsis ([place]) Active Problems:   E coli bacteremia   Acute cholecystitis   Atrial fibrillation with RVR (HCC)   HTN (hypertension)   GERD (gastroesophageal reflux disease)   CAD (coronary artery disease)   Osteomyelitis (HCC)   Acute metabolic encephalopathy   A-fib (HCC)   Transaminitis   Nausea and vomiting   Rheumatoid arthritis ([place])  1 sepsis secondary to acute cholecystitis and E. coli bacteremia.  Patient presented with periumbilical to lower abdominal pain, fevers, nausea and vomiting.  Abdominal ultrasound done in the ED negative for acute cholecystitis.  CT abdomen and pelvis done worrisome for acute cholecystitis.  Patient noted to have a transaminitis, rapidly progressive  encephalopathy on admission, with positive blood cultures of E. coli.  Patient on IV fluids.  HIDA scan ordered and consistent with acute cholecystitis with cystic duct obstruction.  General surgical consultation obtained and general surgery recommended cholecystostomy tube placement at this time which would likely be in for about 6 to 8 weeks.  Per general surgery will likely need cardiac evaluation for preop clearance to determine whether patient is surgical candidate at that time to undergo cholecystectomy.  Patient status post cholecystostomy tube placement by IR on 09/18/2019 with bilious drainage.  LFTs trending down.  Leukocytosis trending down.  Patient with a E. coli bacteremia and sensitivities returned and sensitive to third-generation cephalosporins, quinolones, gentamicin, imipenem, Bactrim, Zosyn.  Continue empiric IV Zosyn.  Continue IV fluids.  Decreased stress dose steroids to IV Solu-Cortef 50 mg IV daily.  Patient noted to be on Plaquenil and chronic steroids for RA and steroid started due to worsening encephalopathy.  GI reviewed chart and due to HIDA scan results at this time was deferring to general surgery recommending that if patient does not improve with percutaneous drain or if LFTs continue to rise patient will need a MRCP and GI to be called back to reconsult.  GI formally consulted on patient on 09/18/2019.  IR to reassess cholecystostomy tube as patient noted to have gotten confused overnight and tube removed.  Currently on full liquid diet.  Defer diet advancement to general surgery.  General surgery following and appreciate input and  recommendations.  2.  E. coli bacteremia Likely secondary to acute cholecystitis.  Sensitivities resulted and sensitive to third-generation cephalosporins, quinolones, gentamicin, imipenem, Bactrim, Zosyn.  Slowly improving currently afebrile.  Leukocytosis trending down.  Continue empiric IV Zosyn.  Will likely need 10 to 14-day treatment of  antibiotic.  3.  Rheumatoid arthritis Plaquenil and prednisone on hold.  Patient was started on IV stress dose steroids.  Solu-Cortef was decreased to every 12 hours on 09/18/2019.  Decreased IV Solu-Cortef to daily.  Likely resume Plaquenil once acute infection has resolved in the outpatient setting.  4.  History of atrial fibrillation/A. fib with RVR Patient noted to go into A. fib with RVR post cholecystostomy tube placement the evening of 09/18/2019 and had to be placed on a Cardizem drip.  Heart rate was better controlled on 09/19/2019 and patient transition to oral Cardizem 30 mg p.o. every 6 hours.  Cardizem drip has been discontinued.  2D echo with a EF of 55 to 60%, mild LVH, no wall motion abnormalities, mild to moderate mitral valvular regurgitation.  Heart rate noted overnight to be in the 120s.  Continue current dose of oral Cardizem to 60 mg p.o. every 6 hours and monitor heart rate closely. Patient on chronic Plavix for coronary artery disease/PAD but not on chronic blood thinners.  Plavix on hold s/p cholecystostomy tube placement.  General surgery/IR to advise when Plavix may be resumed.   5.  Acute metabolic encephalopathy/cognitive dysfunction. Likely secondary to problem #1.  Patient may have a likely mild cognitive dysfunction/possible dementia at baseline.  Patient noted to have some confusion overnight and agitation requiring Haldol per RN. Head CT negative for any acute abnormalities.  Patient with a E. coli bacteremia as well as an acute cholecystitis.  Continue empiric IV Zosyn.  Continue Zyprexa 5 mg p.o. twice daily.  Landscape architect.  Soft restraints.  DC Haldol as per RN patient with QT prolongation.  Repeat EKG pending.  Supportive care.    6.  Coronary artery disease/PAD Stable.  Plavix on hold prior to cholecystostomy tube placement per IR.  General surgery to advise when Plavix may be resumed.  Continue home regimen of aspirin, Lipitor, fish oil.  Will likely need  outpatient evaluation by cardiology post discharge for preop clearance for possible cholecystectomy in about 6 to 8 weeks.  General surgery to advise when Plavix may be resumed.  7.  Hypertension Blood pressure noted to be borderline initially however has improved.  Patient also noted to have some agitation requiring Haldol. Patient was on a Cardizem drip and has been transitioned to oral Cardizem currently at 60 mg every 6 hours.  Continue IV Solu-Cortef 50 mg daily.  8.  Gastroesophageal reflux disease Continue PPI.  9.  Hypokalemia K. Dur 40 mEq p.o. every 4 hours x2 doses.  Per RN patient refusing oral intake.  Will place potassium and IV fluids.  We will give KCl 10 mEq IV every 1 hour x4 runs.  Repeat BMET this afternoon.  10.  QT prolongation Per RN patient with QT prolongation on telemetry.  Get a EKG.  Replete electrolytes.  Discontinue Haldol.  Ativan as needed.   DVT prophylaxis: Lovenox Code Status: Full Family Communication: Updated patient.  Updated son, Prescott Gum over telephone. Disposition Plan: Remain in stepdown unit.  Could likely transfer to telemetry tomorrow if continued stabilization and improvement.  To be determined.   Consultants:   GI: Dr. Watt Climes 09/18/2019   General surgery: Saverio Danker, PA/Dr. Marlou Starks  09/17/2019  Procedures:   CT abdomen and pelvis 09/16/2019  Right upper quadrant ultrasound 09/16/2019  Chest x-ray 09/16/2019  HIDA scan 09/17/2019  CT head 09/17/2019  IR/ultrasound percutaneous cholecystostomy cath placement per Dr. Vernard Gambles 09/18/2019  Antimicrobials:   IV Zosyn 09/16/2019   Subjective: Patient noted to be agitated overnight and received Haldol.  Patient confused.  Patient somewhat drowsy.  Patient refusing oral potassium per RN.  Currently on a full liquid diet.     Objective: Vitals:   09/21/19 0500 09/21/19 0600 09/21/19 0700 09/21/19 0800  BP: (!) 168/109 (!) 172/76 (!) 129/96 (!) 134/97  Pulse: (!) 103 95 89 98   Resp:    (!) 21  Temp:    97.9 F (36.6 C)  TempSrc:    Oral  SpO2: 100% 100% 100% 99%  Weight:      Height:        Intake/Output Summary (Last 24 hours) at 09/21/2019 0904 Last data filed at 09/21/2019 0903 Gross per 24 hour  Intake 1534.6 ml  Output 2900 ml  Net -1365.4 ml   Filed Weights   09/16/19 1110 09/20/19 0729 09/21/19 0230  Weight: 80.8 kg 83.3 kg 82.7 kg    Examination:  General exam: Pleasantly confused.  Dry mucous membranes. Respiratory system: Lungs clear to auscultation bilaterally anterior lung fields.  No wheezes, no crackles, no rhonchi.  Normal respiratory effort.  Cardiovascular system: Irregularly irregular.  No JVD.  No lower extremity edema.   Gastrointestinal system: Abdomen is nondistended, nontender, soft, positive bowel sounds.  No rebound.  No guarding.  Cholecystostomy tube with biliary drainage noted. Central nervous system: Alert to self only.  Moving extremities spontaneously.  Pleasantly confused. Extremities: s/p right transmetartasal amputation. Skin: No rashes, lesions or ulcers Psychiatry: Judgement and insight appear poor. Mood & affect appropriate.     Data Reviewed: I have personally reviewed following labs and imaging studies  CBC: Recent Labs  Lab 09/16/19 1113 09/17/19 0233 09/18/19 0217 09/19/19 0158 09/20/19 0214 09/21/19 0214  WBC 17.6* 20.2* 16.1* 12.9* 8.6 9.6  NEUTROABS 15.0*  --  14.3* 11.3* 7.6 6.7  HGB 11.3* 10.6* 8.0* 8.6* 8.8* 7.9*  HCT 36.6* 35.1* 26.6* 29.0* 27.7* 24.6*  MCV 82.4 86.9 86.4 85.8 83.9 80.9  PLT 277 219 176 205 208 [id number]   Basic Metabolic Panel: Recent Labs  Lab 09/17/19 0233 09/18/19 0217 09/19/19 0158 09/20/19 0214 09/21/19 0214  NA 135 135 138 138 142  K 4.5 4.1 3.9 3.1* 2.8*  CL 103 104 106 105 108  CO2 21* 20* 23 24 25   GLUCOSE 115* 121* 139* 130* 88  BUN 17 24* 20 16 11   CREATININE 1.06 0.93 0.95 0.83 0.86  CALCIUM 9.0 8.5* 8.5* 8.5* 8.5*  MG  --   --  1.9 2.1  --     GFR: Estimated Creatinine Clearance: 70.2 mL/min (by C-G formula based on SCr of 0.86 mg/dL). Liver Function Tests: Recent Labs  Lab 09/17/19 0233 09/18/19 0217 09/19/19 0158 09/20/19 0214 09/21/19 0214  AST 112*  111* 50* 34 27 30  ALT 166*  167* 96* 74* 59* 51*  ALKPHOS 132*  133* 102 89 76 65  BILITOT 1.4*  1.5* 1.0 0.6 0.9 1.0  PROT 6.6  6.6 5.4* 5.5* 5.6* 5.4*  ALBUMIN 3.1*  3.1* 2.4* 2.4* 2.4* 2.4*   Recent Labs  Lab 09/16/19 1113  LIPASE 24   No results for input(s): AMMONIA in the last 168 hours. Coagulation Profile: Recent Labs  Lab 09/18/19 0905  INR 1.2   Cardiac Enzymes: No results for input(s): CKTOTAL, CKMB, CKMBINDEX, TROPONINI in the last 168 hours. BNP (last 3 results) No results for input(s): PROBNP in the last 8760 hours. HbA1C: No results for input(s): HGBA1C in the last 72 hours. CBG: Recent Labs  Lab 09/20/19 1702 09/20/19 2022 09/20/19 2313 09/21/19 0339 09/21/19 0744  GLUCAP 135* 105* 98 86 86   Lipid Profile: No results for input(s): CHOL, HDL, LDLCALC, TRIG, CHOLHDL, LDLDIRECT in the last 72 hours. Thyroid Function Tests: No results for input(s): TSH, T4TOTAL, FREET4, T3FREE, THYROIDAB in the last 72 hours. Anemia Panel: No results for input(s): VITAMINB12, FOLATE, FERRITIN, TIBC, IRON, RETICCTPCT in the last 72 hours. Sepsis Labs: Recent Labs  Lab 09/16/19 1239 09/16/19 1446 09/17/19 0002 09/17/19 0233  LATICACIDVEN 2.9* 1.9 1.5 1.4    Recent Results (from the past 240 hour(s))  Blood culture (routine x 2)     Status: Abnormal   Collection Time: 09/16/19 12:15 PM   Specimen: Right Antecubital; Blood  Result Value Ref Range Status   Specimen Description   Final    RIGHT ANTECUBITAL Performed at [facility], [address] [place], [place] [address]    Special Requests   Final    BOTTLES DRAWN AEROBIC AND ANAEROBIC Blood Culture adequate volume Performed at [facility], [address] [place], [place] [address]    Culture  Setup Time   Final    IN BOTH AEROBIC AND ANAEROBIC BOTTLES GRAM NEGATIVE RODS CRITICAL VALUE NOTED.  VALUE IS CONSISTENT WITH PREVIOUSLY REPORTED AND CALLED VALUE.    Culture (A)  Final    ESCHERICHIA COLI SUSCEPTIBILITIES PERFORMED ON PREVIOUS CULTURE WITHIN THE LAST 5 DAYS. Performed at [facility], [place] [address], [place], [place] [address]    Report Status 09/19/2019 FINAL  Final  Blood culture (routine x 2)     Status: Abnormal   Collection Time: 09/16/19 12:30 PM   Specimen: BLOOD LEFT WRIST  Result Value Ref Range Status   Specimen Description   Final    BLOOD LEFT WRIST Performed at [facility], [address] [place], [place] [address]    Special Requests   Final    BOTTLES DRAWN AEROBIC AND ANAEROBIC Blood Culture adequate volume Performed at [facility], [address] [place], [place] [address]    Culture  Setup Time   Final    IN BOTH AEROBIC AND ANAEROBIC BOTTLES GRAM NEGATIVE RODS CRITICAL RESULT CALLED TO, READ BACK BY AND VERIFIED WITH: B GREEN PHARMD 09/17/19 0428 JDW Performed at [facility], [place] [address], [place],  [address]    Culture ESCHERICHIA COLI (A)  Final   Report Status 09/19/2019 FINAL  Final   Organism ID, Bacteria ESCHERICHIA COLI  Final      Susceptibility   Escherichia coli - MIC*    AMPICILLIN >=32 RESISTANT Resistant     CEFAZOLIN >=64 RESISTANT Resistant     CEFEPIME <=1 SENSITIVE Sensitive     CEFTAZIDIME <=1 SENSITIVE Sensitive     CEFTRIAXONE <=1 SENSITIVE Sensitive     CIPROFLOXACIN <=0.25 SENSITIVE Sensitive     GENTAMICIN <=1 SENSITIVE Sensitive     IMIPENEM <=0.25 SENSITIVE Sensitive     TRIMETH/SULFA <=20 SENSITIVE Sensitive     AMPICILLIN/SULBACTAM >=32 RESISTANT Resistant     PIP/TAZO 8 SENSITIVE Sensitive     * ESCHERICHIA COLI  Blood Culture ID Panel (Reflexed)     Status: Abnormal   Collection Time: 09/16/19 12:30 PM  Result Value  Ref Range Status   Enterococcus species NOT DETECTED NOT DETECTED Final   Listeria monocytogenes NOT DETECTED NOT DETECTED Final   Staphylococcus species NOT DETECTED NOT DETECTED Final   Staphylococcus aureus (BCID) NOT DETECTED NOT DETECTED Final   Streptococcus species NOT DETECTED NOT DETECTED Final   Streptococcus agalactiae NOT DETECTED NOT DETECTED Final   Streptococcus pneumoniae NOT DETECTED NOT DETECTED Final   Streptococcus pyogenes NOT DETECTED NOT DETECTED Final   Acinetobacter baumannii NOT DETECTED NOT DETECTED Final   Enterobacteriaceae species DETECTED (A) NOT DETECTED Final    Comment: Enterobacteriaceae represent a large family of gram-negative bacteria, not a single organism. CRITICAL RESULT CALLED TO, READ BACK BY AND VERIFIED WITH: B GREEN PHARMD 09/17/19 0428 JDW    Enterobacter cloacae complex NOT DETECTED NOT DETECTED Final   Escherichia coli DETECTED (A) NOT DETECTED Final    Comment: CRITICAL RESULT CALLED TO, READ BACK BY AND VERIFIED WITH: B GREEN PHARMD 09/17/19 0428 JDW    Klebsiella oxytoca NOT DETECTED NOT DETECTED Final   Klebsiella pneumoniae NOT DETECTED NOT DETECTED Final   Proteus species NOT DETECTED NOT DETECTED Final   Serratia marcescens NOT DETECTED NOT DETECTED Final   Carbapenem resistance NOT DETECTED NOT DETECTED Final   Haemophilus influenzae NOT DETECTED NOT DETECTED Final   Neisseria meningitidis NOT DETECTED NOT DETECTED Final   Pseudomonas aeruginosa NOT DETECTED NOT DETECTED Final   Candida albicans NOT DETECTED NOT DETECTED Final   Candida glabrata NOT DETECTED NOT DETECTED Final   Candida krusei NOT DETECTED NOT DETECTED Final   Candida parapsilosis NOT DETECTED NOT DETECTED Final   Candida tropicalis NOT DETECTED NOT DETECTED Final    Comment: Performed at [facility], [place] [address] [place], [place] [address]  SARS CORONAVIRUS 2 (TAT 6-24 HRS) Nasopharyngeal Nasopharyngeal Swab     Status: None   Collection Time:  09/16/19 12:50 PM   Specimen: Nasopharyngeal Swab  Result Value Ref Range Status   SARS Coronavirus 2 NEGATIVE NEGATIVE Final    Comment: (NOTE) SARS-CoV-2 target nucleic acids are NOT DETECTED. The SARS-CoV-2 RNA is generally detectable in upper and lower respiratory specimens during the acute phase of infection. Negative results do not preclude SARS-CoV-2 infection, do not rule out co-infections with other pathogens, and should not be used as the sole basis for treatment or other patient management decisions. Negative results must be combined with clinical observations, patient history, and epidemiological information. The expected result is Negative. Fact Sheet for Patients: SugarRoll.be Fact Sheet for Healthcare Providers: https://www.woods-mathews.com/ This test is not yet approved or cleared by the [place] FDA and  has been authorized for detection and/or diagnosis of SARS-CoV-2 by FDA under an Emergency Use Authorization (EUA). This EUA will remain  in effect (meaning this test can be used) for the duration of the COVID-19 declaration under Section 56 4(b)(1) of the Act, 21 U.S.C. section 360bbb-3(b)(1), unless the authorization is terminated or revoked sooner. Performed at [facility], [place] [address], [place], [place] [address]   MRSA PCR Screening     Status: None   Collection Time: 09/16/19 11:34 PM   Specimen: Nasal Mucosa; Nasopharyngeal  Result Value Ref Range Status   MRSA by PCR NEGATIVE NEGATIVE Final    Comment:        The GeneXpert MRSA Assay (FDA approved for NASAL specimens only), is  one component of a comprehensive MRSA colonization surveillance program. It is not intended to diagnose MRSA infection nor to guide or monitor treatment for MRSA infections. Performed at [facility], [place] [address], [place], [place] [address]          Radiology Studies: DG Abd Portable  1V  Result Date: 09/20/2019 CLINICAL DATA:  Biliary drainage tube. EXAM: PORTABLE ABDOMEN - 1 VIEW COMPARISON:  September 18, 2019. FINDINGS: The bowel gas pattern is normal. Percutaneous drainage catheter is noted in right upper quadrant. No radio-opaque calculi or other significant radiographic abnormality are seen. IMPRESSION: Percutaneous drainage catheter is noted in right upper quadrant. Whether or not catheter tip is positioned within the gallbladder lumen cannot be determined by this radiograph. Electronically Signed   By: Marijo Conception M.D.   On: 09/20/2019 11:17   ECHOCARDIOGRAM COMPLETE  Result Date: 09/19/2019   ECHOCARDIOGRAM REPORT   Patient Name:   RASHAWN HABICHT Date of Exam: 09/19/2019 Medical Rec #:  [id number]   Height:       72.0 in Accession #:    [id number]  Weight:       178.1 lb Date of Birth:  [birth year]  BSA:          2.03 m Patient Age:    37 years    BP:           137/69 mmHg Patient Gender: M           HR:           94 bpm. Exam Location:  Inpatient Procedure: 2D Echo Indications:    Atrial Fibrillation 427.31 / I48.91  History:        Patient has prior history of Echocardiogram examinations, most                 recent 12/12/2018. CAD; Risk Factors:Hypertension. AKI                 Sepsis.  Sonographer:    Vikki Ports Turrentine Referring Phys: [id number] Shaliyah Taite V Maha Fischel  Sonographer Comments: Suboptimal subcostal window due to patient body habitus. IMPRESSIONS  1. Left ventricular ejection fraction, by visual estimation, is 55 to 60%. The left ventricle has normal function. There is mildly increased left ventricular hypertrophy.  2. Left ventricular diastolic parameters are indeterminate.  3. The left ventricle has no regional wall motion abnormalities.  4. Global right ventricle has normal systolic function.The right ventricular size is normal. No increase in right ventricular wall thickness.  5. Left atrial size was moderately dilated.  6. Right atrial size was normal.  7. The mitral  valve is normal in structure. Mild to moderate mitral valve regurgitation.  8. Very eccentric anterior directed MR jet. The degree of MR may be underestimated due to the eccentrity of the jet. The MV to AV VTI ratio is 0.6 suggesting mild MR.  9. The tricuspid valve is normal in structure. Tricuspid valve regurgitation is trivial. 10. The aortic valve is tricuspid. Aortic valve regurgitation is not visualized. No evidence of aortic valve sclerosis or stenosis. 11. Pulmonic regurgitation is mild. 12. The pulmonic valve was not well visualized. Pulmonic valve regurgitation is mild. 13. The interatrial septum was not well visualized. FINDINGS  Left Ventricle: Left ventricular ejection fraction, by visual estimation, is 55 to 60%. The left ventricle has normal function. The left ventricle has no regional wall motion abnormalities. There is mildly increased left ventricular hypertrophy. Left ventricular diastolic parameters are indeterminate. Right Ventricle:  The right ventricular size is normal. No increase in right ventricular wall thickness. Global RV systolic function is has normal systolic function. Left Atrium: Left atrial size was moderately dilated. Right Atrium: Right atrial size was normal in size Pericardium: There is no evidence of pericardial effusion. Mitral Valve: The mitral valve is normal in structure. Mild to moderate mitral valve regurgitation. Very eccentric anterior directed MR jet. The degree of MR may be underestimated due to the eccentrity of the jet. The MV to AV VTI ratio is 0.6 suggesting  mild MR. Tricuspid Valve: The tricuspid valve is normal in structure. Tricuspid valve regurgitation is trivial. Aortic Valve: The aortic valve is tricuspid. Aortic valve regurgitation is not visualized. The aortic valve is structurally normal, with no evidence of sclerosis or stenosis. Aortic valve mean gradient measures 7.3 mmHg. Aortic valve peak gradient measures 13.7 mmHg. Aortic valve area, by VTI  measures 1.77 cm. Pulmonic Valve: The pulmonic valve was not well visualized. Pulmonic valve regurgitation is mild. Pulmonic regurgitation is mild. No evidence of pulmonic stenosis. Aorta: The aortic root is normal in size and structure. Pulmonary Artery: Indeterminate PASP, inadequate TR jet. Venous: The inferior vena cava was not well visualized. IAS/Shunts: The interatrial septum was not well visualized.  LEFT VENTRICLE PLAX 2D LVIDd:         4.50 cm LVIDs:         3.30 cm LV PW:         1.20 cm LV IVS:        1.20 cm LVOT diam:     2.10 cm LV SV:         48 ml LV SV Index:   23.80 LVOT Area:     3.46 cm  RIGHT VENTRICLE RV S prime:     16.00 cm/s TAPSE (M-mode): 1.6 cm LEFT ATRIUM              Index       RIGHT ATRIUM           Index LA diam:        4.60 cm  2.27 cm/m  RA Area:     13.90 cm LA Vol (A2C):   103.0 ml 50.79 ml/m RA Volume:   27.40 ml  13.51 ml/m LA Vol (A4C):   52.3 ml  25.79 ml/m LA Biplane Vol: 78.9 ml  38.90 ml/m  AORTIC VALVE AV Area (Vmax):    1.72 cm AV Area (Vmean):   1.81 cm AV Area (VTI):     1.77 cm AV Vmax:           185.22 cm/s AV Vmean:          124.794 cm/s AV VTI:            0.352 m AV Peak Grad:      13.7 mmHg AV Mean Grad:      7.3 mmHg LVOT Vmax:         92.03 cm/s LVOT Vmean:        65.200 cm/s LVOT VTI:          0.180 m LVOT/AV VTI ratio: 0.51  AORTA Ao Root diam: 3.40 cm MITRAL VALVE                        TRICUSPID VALVE MV Area (PHT): 3.91 cm             TR Peak grad:   13.4 mmHg MV VTI:  0.21 m               TR Vmax:        183.00 cm/s MV PHT:        56.26 msec MV Decel Time: 194 msec             SHUNTS MV E velocity: 84.27 cm/s 103 cm/s  Systemic VTI:  0.18 m MV A velocity: 79.70 cm/s 70.3 cm/s Systemic Diam: 2.10 cm MV E/A ratio:  1.06       1.5  Carlyle Dolly MD Electronically signed by Carlyle Dolly MD Signature Date/Time: 09/19/2019/12:26:36 PM    Final         Scheduled Meds: . aspirin  81 mg Oral Daily  . atorvastatin  40 mg Oral Daily   . Chlorhexidine Gluconate Cloth  6 each Topical Daily  . diltiazem  60 mg Oral Q6H  . docusate sodium  250 mg Oral Daily  . enoxaparin (LOVENOX) injection  40 mg Subcutaneous Daily  . finasteride  5 mg Oral QPM  . hydrocortisone sod succinate (SOLU-CORTEF) inj  50 mg Intravenous Daily  . insulin aspart  0-9 Units Subcutaneous Q4H  . mouth rinse  15 mL Mouth Rinse BID  . OLANZapine zydis  5 mg Oral BID  . omega-3 acid ethyl esters  1 g Oral Daily  . pantoprazole  40 mg Oral BID  . potassium chloride  40 mEq Oral Q4H  . sertraline  25 mg Oral Daily  . sodium chloride flush  5 mL Intracatheter Q8H   Continuous Infusions: . sodium chloride Stopped (09/16/19 1347)  . piperacillin-tazobactam (ZOSYN)  IV 12.5 mL/hr at 09/21/19 0903  . potassium chloride    . sodium chloride 0.9 % 1,000 mL with potassium chloride 40 mEq infusion       LOS: 5 days    Time spent: 40 minutes    Irine Seal, MD [facility]  If 7PM-7AM, please contact night-coverage [email or web address] 09/21/2019, 9:04 AM

## 2019-09-21 NOTE — Progress Notes (Signed)
OT Cancellation Note  Patient Details Name: Derrick [name] MRN: [id number] DOB: [birth year]   Cancelled Treatment:    Reason Eval/Treat Not Completed: Patient at procedure or test/ unavailable  Will check on pt day  [name], [place] [phone] Office- [phone], Thereasa Parkin 09/21/2019, 4:20 PM

## 2019-09-21 NOTE — Progress Notes (Signed)
Chief Complaint: Patient was seen today for perc chole drain  Supervising Physician: Jacqulynn Cadet  Patient Status: [facility] - In-pt  Subjective: S/p perc chole 12/11 Still with encephalopathy Pulled at drain and broke suture. Drain looks intact per RN  Objective: Physical Exam: BP (!) 134/45   Pulse 75   Temp 98.6 F (37 C) (Oral)   Resp 12   Ht 6' (1.829 m)   Wt 82.7 kg   SpO2 100%   BMI 24.73 kg/m  RUQ drain intact. Suture broke but drain does not appear retracted. Bilious output in bag. Drain flushes easily with brisk return of bile   Current Facility-Administered Medications:  .  0.9 %  sodium chloride infusion, , Intravenous, PRN, Virgel Manifold, MD, Stopped at 09/16/19 1347 .  0.9 % NaCl with KCl 40 mEq / L  infusion, , Intravenous, Continuous, Eugenie Filler, MD, Stopped at 09/21/19 1104 .  acetaminophen (TYLENOL) tablet 650 mg, 650 mg, Oral, Q6H PRN **OR** acetaminophen (TYLENOL) suppository 650 mg, 650 mg, Rectal, Q6H PRN, Etta Quill, DO, 650 mg at 09/18/19 2200 .  aspirin chewable tablet 81 mg, 81 mg, Oral, Daily, Eugenie Filler, MD, 81 mg at 09/20/19 0943 .  atorvastatin (LIPITOR) tablet 40 mg, 40 mg, Oral, Daily, Eugenie Filler, MD, 40 mg at 09/20/19 0944 .  Chlorhexidine Gluconate Cloth 2 % PADS 6 each, 6 each, Topical, Daily, Eugenie Filler, MD, 6 each at 09/21/19 0957 .  diltiazem (CARDIZEM) tablet 60 mg, 60 mg, Oral, Q6H, Eugenie Filler, MD, 60 mg at 09/21/19 1148 .  docusate sodium (COLACE) capsule 250 mg, 250 mg, Oral, Daily, Eugenie Filler, MD, 250 mg at 09/19/19 1001 .  enoxaparin (LOVENOX) injection 40 mg, 40 mg, Subcutaneous, Daily, Allred, Darrell K, PA-C, 40 mg at 09/21/19 0957 .  finasteride (PROSCAR) tablet 5 mg, 5 mg, Oral, QPM, Eugenie Filler, MD, 5 mg at 09/20/19 1711 .  insulin aspart (novoLOG) injection 0-9 Units, 0-9 Units, Subcutaneous, Q4H, Gardner, Jared M, DO, 1 Units at 09/20/19 1712 .  labetalol  (NORMODYNE) injection 10 mg, 10 mg, Intravenous, Q4H PRN, Rai, Ripudeep K, MD, 10 mg at 09/21/19 0605 .  MEDLINE mouth rinse, 15 mL, Mouth Rinse, BID, Eugenie Filler, MD, 15 mL at 09/21/19 0958 .  morphine 2 MG/ML injection 1 mg, 1 mg, Intravenous, Q4H PRN, Eugenie Filler, MD, 1 mg at 09/20/19 0055 .  OLANZapine zydis (ZYPREXA) disintegrating tablet 5 mg, 5 mg, Oral, BID, Eugenie Filler, MD, 5 mg at 09/21/19 0953 .  omega-3 acid ethyl esters (LOVAZA) capsule 1 g, 1 g, Oral, Daily, Eugenie Filler, MD, 1 g at 09/19/19 1001 .  ondansetron (ZOFRAN) tablet 4 mg, 4 mg, Oral, Q6H PRN **OR** ondansetron (ZOFRAN) injection 4 mg, 4 mg, Intravenous, Q6H PRN, Alcario Drought, Jared M, DO .  pantoprazole (PROTONIX) EC tablet 40 mg, 40 mg, Oral, BID, Eugenie Filler, MD, 40 mg at 09/20/19 2148 .  piperacillin-tazobactam (ZOSYN) IVPB 3.375 g, 3.375 g, Intravenous, Q8H, Dorrene German, [place], Stopped at 09/21/19 1153 .  potassium chloride (KLOR-CON) packet 40 mEq, 40 mEq, Oral, Q4H, Eugenie Filler, MD, 40 mEq at 09/21/19 1148 .  potassium chloride 10 mEq in 100 mL IVPB, 10 mEq, Intravenous, Q1 Hr x 4, Eugenie Filler, MD, Last Rate: 100 mL/hr at 09/21/19 1308, 10 mEq at 09/21/19 1308 .  [START ON 09/22/2019] predniSONE (DELTASONE) tablet 60 mg, 60 mg, Oral, QAC breakfast, [place] Silos,  Malachy Moan, MD .  sertraline (ZOLOFT) tablet 25 mg, 25 mg, Oral, Daily, Eugenie Filler, MD, 25 mg at 09/20/19 0945 .  sodium chloride flush (NS) 0.9 % injection 10-40 mL, 10-40 mL, Intracatheter, PRN, Clarene Essex, MD .  sodium chloride flush (NS) 0.9 % injection 5 mL, 5 mL, Intracatheter, Q8H, Arne Cleveland, MD, 5 mL at 09/21/19 0542  Labs: CBC Recent Labs    09/20/19 0214 09/21/19 0214  WBC 8.6 9.6  HGB 8.8* 7.9*  HCT 27.7* 24.6*  PLT 208 199   BMET Recent Labs    09/20/19 0214 09/21/19 0214  NA 138 142  K 3.1* 2.8*  CL 105 108  CO2 24 25  GLUCOSE 130* 88  BUN 16 11  CREATININE 0.83 0.86   CALCIUM 8.5* 8.5*   LFT Recent Labs    09/21/19 0214  PROT 5.4*  ALBUMIN 2.4*  AST 30  ALT 51*  ALKPHOS 65  BILITOT 1.0   PT/INR No results for input(s): LABPROT, INR in the last 72 hours.   Studies/Results: DG Abd Portable 1V  Result Date: 09/20/2019 CLINICAL DATA:  Biliary drainage tube. EXAM: PORTABLE ABDOMEN - 1 VIEW COMPARISON:  September 18, 2019. FINDINGS: The bowel gas pattern is normal. Percutaneous drainage catheter is noted in right upper quadrant. No radio-opaque calculi or other significant radiographic abnormality are seen. IMPRESSION: Percutaneous drainage catheter is noted in right upper quadrant. Whether or not catheter tip is positioned within the gallbladder lumen cannot be determined by this radiograph. Electronically Signed   By: Marijo Conception M.D.   On: 09/20/2019 11:17    Assessment/Plan: S/p perc chole Drain remains intact and functional. Secure dressing. Agree with binder and/or wrist restraints IR following    LOS: 5 days   I spent a total of 20 minutes in face to face in clinical consultation, greater than 50% of which was counseling/coordinating care for perc chole drain  Ascencion Dike PA-C 09/21/2019 1:21 PM

## 2019-09-21 NOTE — Progress Notes (Signed)
Patient ID: Derrick [name], male   DOB: 1932/01/08, 85 y.o.   MRN: [id number]       Subjective: Confused.  Really doesn't converse.  Pulled perc chole drain out some yesterday.  ROS: unable  Objective: Vital signs in last 24 hours: Temp:  [97.6 F (36.4 C)-99.2 F (37.3 C)] 97.9 F (36.6 C) (12/14 0800) Pulse Rate:  [83-126] 88 (12/14 1000) Resp:  [15-31] 18 (12/14 1000) BP: (129-172)/(60-130) 139/60 (12/14 1000) SpO2:  [91 %-100 %] 99 % (12/14 1000) Weight:  [82.7 kg] 82.7 kg (12/14 0230)    Intake/Output from previous day: 12/13 0701 - 12/14 0700 In: 1286.7 [P.O.:120; I.V.:1040.3; IV Piggyback:116.4] Out: 2900 [Urine:2550; Drains:350] Intake/Output this shift: Total I/O In: 247.9 [I.V.:227.9; Other:5; IV Piggyback:15] Out: -   PE: Abd: soft, perc chole drain with suture out.  Tube appears likely still in place with bilious output.  Soft, seems relatively nontender, +BS.  Lab Results:  Recent Labs    09/20/19 0214 09/21/19 0214  WBC 8.6 9.6  HGB 8.8* 7.9*  HCT 27.7* 24.6*  PLT 208 199   BMET Recent Labs    09/20/19 0214 09/21/19 0214  NA 138 142  K 3.1* 2.8*  CL 105 108  CO2 24 25  GLUCOSE 130* 88  BUN 16 11  CREATININE 0.83 0.86  CALCIUM 8.5* 8.5*   PT/INR No results for input(s): LABPROT, INR in the last 72 hours. CMP     Component Value Date/Time   NA 142 09/21/2019 0214   K 2.8 (L) 09/21/2019 0214   CL 108 09/21/2019 0214   CO2 25 09/21/2019 0214   GLUCOSE 88 09/21/2019 0214   BUN 11 09/21/2019 0214   CREATININE 0.86 09/21/2019 0214   CREATININE 1.68 (H) 04/09/2016 1037   CALCIUM 8.5 (L) 09/21/2019 0214   PROT 5.4 (L) 09/21/2019 0214   ALBUMIN 2.4 (L) 09/21/2019 0214   AST 30 09/21/2019 0214   ALT 51 (H) 09/21/2019 0214   ALKPHOS 65 09/21/2019 0214   BILITOT 1.0 09/21/2019 0214   GFRNONAA >60 09/21/2019 0214   GFRAA >60 09/21/2019 0214   Lipase     Component Value Date/Time   LIPASE 24 09/16/2019 1113        Studies/Results: DG Abd Portable 1V  Result Date: 09/20/2019 CLINICAL DATA:  Biliary drainage tube. EXAM: PORTABLE ABDOMEN - 1 VIEW COMPARISON:  September 18, 2019. FINDINGS: The bowel gas pattern is normal. Percutaneous drainage catheter is noted in right upper quadrant. No radio-opaque calculi or other significant radiographic abnormality are seen. IMPRESSION: Percutaneous drainage catheter is noted in right upper quadrant. Whether or not catheter tip is positioned within the gallbladder lumen cannot be determined by this radiograph. Electronically Signed   By: Marijo Conception M.D.   On: 09/20/2019 11:17   ECHOCARDIOGRAM COMPLETE  Result Date: 09/19/2019   ECHOCARDIOGRAM REPORT   Patient Name:   Derrick [name] Date of Exam: 09/19/2019 Medical Rec #:  [id number]   Height:       72.0 in Accession #:    [id number]  Weight:       178.1 lb Date of Birth:  07/23/1932  BSA:          2.03 m Patient Age:    12 years    BP:           137/69 mmHg Patient Gender: M           HR:  94 bpm. Exam Location:  Inpatient Procedure: 2D Echo Indications:    Atrial Fibrillation 427.31 / I48.91  History:        Patient has prior history of Echocardiogram examinations, most                 recent 12/12/2018. CAD; Risk Factors:Hypertension. AKI                 Sepsis.  Sonographer:    Vikki Ports Turrentine Referring Phys: [id number] DANIEL V THOMPSON  Sonographer Comments: Suboptimal subcostal window due to patient body habitus. IMPRESSIONS  1. Left ventricular ejection fraction, by visual estimation, is 55 to 60%. The left ventricle has normal function. There is mildly increased left ventricular hypertrophy.  2. Left ventricular diastolic parameters are indeterminate.  3. The left ventricle has no regional wall motion abnormalities.  4. Global right ventricle has normal systolic function.The right ventricular size is normal. No increase in right ventricular wall thickness.  5. Left atrial size was moderately dilated.  6.  Right atrial size was normal.  7. The mitral valve is normal in structure. Mild to moderate mitral valve regurgitation.  8. Very eccentric anterior directed MR jet. The degree of MR may be underestimated due to the eccentrity of the jet. The MV to AV VTI ratio is 0.6 suggesting mild MR.  9. The tricuspid valve is normal in structure. Tricuspid valve regurgitation is trivial. 10. The aortic valve is tricuspid. Aortic valve regurgitation is not visualized. No evidence of aortic valve sclerosis or stenosis. 11. Pulmonic regurgitation is mild. 12. The pulmonic valve was not well visualized. Pulmonic valve regurgitation is mild. 13. The interatrial septum was not well visualized. FINDINGS  Left Ventricle: Left ventricular ejection fraction, by visual estimation, is 55 to 60%. The left ventricle has normal function. The left ventricle has no regional wall motion abnormalities. There is mildly increased left ventricular hypertrophy. Left ventricular diastolic parameters are indeterminate. Right Ventricle: The right ventricular size is normal. No increase in right ventricular wall thickness. Global RV systolic function is has normal systolic function. Left Atrium: Left atrial size was moderately dilated. Right Atrium: Right atrial size was normal in size Pericardium: There is no evidence of pericardial effusion. Mitral Valve: The mitral valve is normal in structure. Mild to moderate mitral valve regurgitation. Very eccentric anterior directed MR jet. The degree of MR may be underestimated due to the eccentrity of the jet. The MV to AV VTI ratio is 0.6 suggesting  mild MR. Tricuspid Valve: The tricuspid valve is normal in structure. Tricuspid valve regurgitation is trivial. Aortic Valve: The aortic valve is tricuspid. Aortic valve regurgitation is not visualized. The aortic valve is structurally normal, with no evidence of sclerosis or stenosis. Aortic valve mean gradient measures 7.3 mmHg. Aortic valve peak gradient  measures 13.7 mmHg. Aortic valve area, by VTI measures 1.77 cm. Pulmonic Valve: The pulmonic valve was not well visualized. Pulmonic valve regurgitation is mild. Pulmonic regurgitation is mild. No evidence of pulmonic stenosis. Aorta: The aortic root is normal in size and structure. Pulmonary Artery: Indeterminate PASP, inadequate TR jet. Venous: The inferior vena cava was not well visualized. IAS/Shunts: The interatrial septum was not well visualized.  LEFT VENTRICLE PLAX 2D LVIDd:         4.50 cm LVIDs:         3.30 cm LV PW:         1.20 cm LV IVS:        1.20 cm LVOT diam:  2.10 cm LV SV:         48 ml LV SV Index:   23.80 LVOT Area:     3.46 cm  RIGHT VENTRICLE RV S prime:     16.00 cm/s TAPSE (M-mode): 1.6 cm LEFT ATRIUM              Index       RIGHT ATRIUM           Index LA diam:        4.60 cm  2.27 cm/m  RA Area:     13.90 cm LA Vol (A2C):   103.0 ml 50.79 ml/m RA Volume:   27.40 ml  13.51 ml/m LA Vol (A4C):   52.3 ml  25.79 ml/m LA Biplane Vol: 78.9 ml  38.90 ml/m  AORTIC VALVE AV Area (Vmax):    1.72 cm AV Area (Vmean):   1.81 cm AV Area (VTI):     1.77 cm AV Vmax:           185.22 cm/s AV Vmean:          124.794 cm/s AV VTI:            0.352 m AV Peak Grad:      13.7 mmHg AV Mean Grad:      7.3 mmHg LVOT Vmax:         92.03 cm/s LVOT Vmean:        65.200 cm/s LVOT VTI:          0.180 m LVOT/AV VTI ratio: 0.51  AORTA Ao Root diam: 3.40 cm MITRAL VALVE                        TRICUSPID VALVE MV Area (PHT): 3.91 cm             TR Peak grad:   13.4 mmHg MV VTI:        0.21 m               TR Vmax:        183.00 cm/s MV PHT:        56.26 msec MV Decel Time: 194 msec             SHUNTS MV E velocity: 84.27 cm/s 103 cm/s  Systemic VTI:  0.18 m MV A velocity: 79.70 cm/s 70.3 cm/s Systemic Diam: 2.10 cm MV E/A ratio:  1.06       1.5  Carlyle Dolly MD Electronically signed by Carlyle Dolly MD Signature Date/Time: 09/19/2019/12:26:36 PM    Final     Anti-infectives: Anti-infectives (From  admission, onward)   Start     Dose/Rate Route Frequency Ordered Stop   09/17/19 1000  hydroxychloroquine (PLAQUENIL) tablet 200 mg  Status:  Discontinued     200 mg Oral Daily 09/16/19 2340 09/16/19 2352   09/17/19 0000  piperacillin-tazobactam (ZOSYN) IVPB 3.375 g     3.375 g 12.5 mL/hr over 240 Minutes Intravenous Every 8 hours 09/16/19 2349     09/16/19 1230  piperacillin-tazobactam (ZOSYN) IVPB 3.375 g     3.375 g 100 mL/hr over 30 Minutes Intravenous  Once 09/16/19 1227 09/16/19 1347       Assessment/Plan Atrial fibrillation Coronary artery disease Peripheral artery disease Rheumatoid arthritis History of CVA History of renal cell carcinoma, status post nephrectomy History of prostate cancer Hypertension Chronic kidney disease  Acute cholecystitis with bacteremia and sepsis -abdominal binder to help minimize pulling at tube.   -  IR will need to reassess tube as they feel necessary given patient ruptured suture and pulled drain some.  It appears to still be working currently. -abx per ID given bacteremia -may resume plavix per medicine    FEN - can advance diet as tolerated VTE -Plavix on hold, but can resume, Lovenox ID -Zosyn   LOS: 5 days    Henreitta Cea , [facility] 09/21/2019, 10:42 AM Please see Amion for pager number during day hours 7:00am-4:30pm

## 2019-09-22 LAB — COMPREHENSIVE METABOLIC PANEL
ALT: 53 U/L — ABNORMAL HIGH (ref 0–44)
ALT: 54 U/L — ABNORMAL HIGH (ref 0–44)
AST: 57 U/L — ABNORMAL HIGH (ref 15–41)
AST: 29 U/L (ref 15–41)
Albumin: 2.8 g/dL — ABNORMAL LOW (ref 3.5–5.0)
Albumin: 2.8 g/dL — ABNORMAL LOW (ref 3.5–5.0)
Alkaline Phosphatase: 67 U/L (ref 38–126)
Alkaline Phosphatase: 64 U/L (ref 38–126)
Anion gap: 10 (ref 5–15)
Anion gap: 8 (ref 5–15)
BUN: 12 mg/dL (ref 8–23)
BUN: 11 mg/dL (ref 8–23)
CO2: 23 mmol/L (ref 22–32)
CO2: 24 mmol/L (ref 22–32)
Calcium: 8.3 mg/dL — ABNORMAL LOW (ref 8.9–10.3)
Calcium: 8.5 mg/dL — ABNORMAL LOW (ref 8.9–10.3)
Chloride: 106 mmol/L (ref 98–111)
Chloride: 108 mmol/L (ref 98–111)
Creatinine, Ser: 0.96 mg/dL (ref 0.61–1.24)
Creatinine, Ser: 0.87 mg/dL (ref 0.61–1.24)
GFR calc Af Amer: >60 mL/min (ref >60)
GFR calc Af Amer: >60 mL/min (ref >60)
GFR calc non Af Amer: >60 mL/min (ref >60)
GFR calc non Af Amer: >60 mL/min (ref >60)
Glucose, Bld: 114 mg/dL — ABNORMAL HIGH (ref 70–99)
Glucose, Bld: 108 mg/dL — ABNORMAL HIGH (ref 70–99)
Potassium: 4.5 mmol/L (ref 3.5–5.1)
Potassium: 3.3 mmol/L — ABNORMAL LOW (ref 3.5–5.1)
Sodium: 139 mmol/L (ref 135–145)
Sodium: 140 mmol/L (ref 135–145)
Total Bilirubin: 2.1 mg/dL — ABNORMAL HIGH (ref 0.3–1.2)
Total Bilirubin: 1.3 mg/dL — ABNORMAL HIGH (ref 0.3–1.2)
Total Protein: 6.2 g/dL — ABNORMAL LOW (ref 6.5–8.1)
Total Protein: 6 g/dL — ABNORMAL LOW (ref 6.5–8.1)

## 2019-09-22 LAB — CBC
HCT: 32.6 % — ABNORMAL LOW (ref 39.0–52.0)
Hemoglobin: 10.2 g/dL — ABNORMAL LOW (ref 13.0–17.0)
MCH: 26.4 pg (ref 26.0–34.0)
MCHC: 31.3 g/dL (ref 30.0–36.0)
MCV: 84.2 fL (ref 80.0–100.0)
Platelets: 261 10*3/uL (ref 150–400)
RBC: 3.87 MIL/uL — ABNORMAL LOW (ref 4.22–5.81)
RDW: 16 % — ABNORMAL HIGH (ref 11.5–15.5)
WBC: 11.6 10*3/uL — ABNORMAL HIGH (ref 4.0–10.5)
nRBC: 0 % (ref 0.0–0.2)

## 2019-09-22 LAB — MAGNESIUM
Magnesium: 2.3 mg/dL (ref 1.7–2.4)
Magnesium: 2.1 mg/dL (ref 1.7–2.4)

## 2019-09-22 LAB — GLUCOSE, CAPILLARY
Glucose-Capillary: 100 mg/dL — ABNORMAL HIGH (ref 70–99)
Glucose-Capillary: 99 mg/dL (ref 70–99)
Glucose-Capillary: 141 mg/dL — ABNORMAL HIGH (ref 70–99)
Glucose-Capillary: 173 mg/dL — ABNORMAL HIGH (ref 70–99)
Glucose-Capillary: 141 mg/dL — ABNORMAL HIGH (ref 70–99)

## 2019-09-22 MED ORDER — DILTIAZEM HCL 60 MG PO TABS
60.0000 mg | ORAL_TABLET | Freq: Four times a day (QID) | ORAL | Status: DC
  Administered 2019-09-22 – 2019-09-30 (×32): 60 mg via ORAL
  Filled 2019-09-22: qty 2
  Filled 2019-09-22 (×3): qty 1
  Filled 2019-09-22: qty 2
  Filled 2019-09-22 (×3): qty 1
  Filled 2019-09-22: qty 2
  Filled 2019-09-22 (×4): qty 1
  Filled 2019-09-22 (×2): qty 2
  Filled 2019-09-22 (×2): qty 1
  Filled 2019-09-22: qty 2
  Filled 2019-09-22: qty 1
  Filled 2019-09-22 (×3): qty 2
  Filled 2019-09-22: qty 1
  Filled 2019-09-22 (×2): qty 2
  Filled 2019-09-22: qty 1
  Filled 2019-09-22 (×4): qty 2
  Filled 2019-09-22: qty 1
  Filled 2019-09-22 (×2): qty 2

## 2019-09-22 MED ORDER — CLOPIDOGREL BISULFATE 75 MG PO TABS
75.0000 mg | ORAL_TABLET | Freq: Every day | ORAL | Status: DC
  Administered 2019-09-22 – 2019-09-30 (×9): 75 mg via ORAL
  Filled 2019-09-22 (×9): qty 1

## 2019-09-22 MED ORDER — POTASSIUM CHLORIDE 10 MEQ/100ML IV SOLN
10.0000 meq | INTRAVENOUS | Status: AC
  Administered 2019-09-22 (×4): 10 meq via INTRAVENOUS
  Filled 2019-09-22 (×4): qty 100

## 2019-09-22 NOTE — Progress Notes (Signed)
OT Cancellation Note  Patient Details Name: Derrick [name] MRN: [id number] DOB: [birth year]   Cancelled Treatment:    Reason Eval/Treat Not Completed: Other (comment)  Noted pt is very confused and plan is SNF. Will defer OT needs to SNF  [name], [place] [facility] [phone] Office- [phone], Thereasa Parkin 09/22/2019, 1:49 PM

## 2019-09-22 NOTE — Progress Notes (Signed)
Referring Physician(s): Dr. Leighton Ruff  Supervising Physician: Aletta Edouard  Patient Status:  Derrick [name] - In-pt  Chief Complaint: Follow up percutaneous cholecystostomy placed 09/18/19 by Dr. Vernard Gambles  Subjective:  Patient sleeping upon arrival to room - sitter and wife at bedside, arouses to tactile and loud verbal cues. Patient states that he is cold and would like another blanket, when blanket is removed to assess drain patient visibly shivering which stops when blanket is replaced. Per his wife he has been cold all day.   Allergies: Effexor [venlafaxine], Lisinopril, Paxil [paroxetine], Viagra [sildenafil], and Wellbutrin [bupropion]  Medications: Prior to Admission medications   Medication Sig Start Date End Date Taking? Authorizing Provider  acetaminophen (TYLENOL) 500 MG tablet Take 1,000 mg by mouth every 6 (six) hours as needed for moderate pain or headache.   Yes [provider]  amLODipine (NORVASC) 5 MG tablet Take 1 tablet (5 mg total) by mouth daily. 08/25/19  Yes Croitoru, Mihai, MD  aspirin 81 MG chewable tablet Chew 81 mg by mouth daily.   Yes [provider]  atorvastatin (LIPITOR) 80 MG tablet Take 40 mg by mouth daily.   Yes [provider]  clopidogrel (PLAVIX) 75 MG tablet Take 75 mg by mouth daily.   Yes [provider]  docusate sodium (COLACE) 250 MG capsule Take 250 mg by mouth daily.   Yes [provider]  finasteride (PROSCAR) 5 MG tablet Take 5 mg by mouth every evening.    Yes [provider]  hydroxychloroquine (PLAQUENIL) 200 MG tablet Take 200 mg by mouth daily.   Yes [provider]  Multiple Vitamin (MULTIVITAMIN WITH MINERALS) TABS tablet Take 1 tablet by mouth every evening.    Yes [provider]  nitroGLYCERIN (NITRODUR - DOSED IN MG/24 HR) 0.1 mg/hr patch Place 0.1 mg onto the skin daily. 08/14/19  Yes [provider]  nitroGLYCERIN (NITROSTAT) 0.3 MG SL tablet  Place 0.3 mg under the tongue every 5 (five) minutes as needed for chest pain.   Yes [provider]  Omega-3 Fatty Acids (FISH OIL) 1000 MG CAPS Take 1,000 mg by mouth daily.   Yes [provider]  pantoprazole (PROTONIX) 40 MG tablet Take 40 mg by mouth 2 (two) times daily. 08/03/19  Yes [provider]  polyethylene glycol (MIRALAX / GLYCOLAX) 17 g packet Take 17 g by mouth daily as needed for mild constipation.   Yes [provider]  predniSONE (DELTASONE) 5 MG tablet Take 5 mg by mouth daily with breakfast.   Yes [provider]  sertraline (ZOLOFT) 25 MG tablet Take 25 mg by mouth daily.   Yes [provider]  traMADol (ULTRAM) 50 MG tablet Take 50-100 mg by mouth every 6 (six) hours as needed for moderate pain.   Yes [provider]  traZODone (DESYREL) 50 MG tablet Take 50-100 mg by mouth at bedtime as needed for sleep. 08/24/19  Yes [provider]  HYDROcodone-acetaminophen (NORCO/VICODIN) 5-325 MG tablet Take 1 tablet by mouth every 6 (six) hours as needed for moderate pain ((score 4 to 6)). Patient not taking: Reported on 09/17/2019 12/13/18   Geradine Girt, DO     Vital Signs: BP (!) 154/78   Pulse (!) 48   Temp 98.3 F (36.8 C) (Oral)   Resp 15   Ht 6' (1.829 m)   Wt 171 lb (77.6 kg)   SpO2 90%   BMI 23.19 kg/m   Physical Exam Vitals and  nursing note reviewed.  Constitutional:      General: He is not in acute distress.    Appearance: He is ill-appearing.     Comments: Somnolent - arouses briefly to tactile and loud verbal cues, asking for more blankets.  HENT:     Head: Normocephalic.  Cardiovascular:     Rate and Rhythm: Bradycardia present.  Pulmonary:     Effort: Pulmonary effort is normal.  Abdominal:     General: There is no distension.     Palpations: Abdomen is soft.     Comments: Abdominal binder in place (+) perc chole to gravity with bilious OP, no stat lock and suture is broken  however drain appears to be in place still - flushes/aspirates on my exam today. Insertion site unremarkable.   Skin:    General: Skin is warm and dry.  Neurological:     Mental Status: Mental status is at baseline.     Imaging: IR Perc Cholecystostomy  Result Date: 09/18/2019 CLINICAL DATA:  Acute cholecystitis, currently poor operative candidate EXAM: PERCUTANEOUS CHOLECYSTOSTOMY TUBE PLACEMENT WITH ULTRASOUND AND FLUOROSCOPIC GUIDANCE FLUOROSCOPY TIME:  0.5 minutes; 197 uGym2 DAP TECHNIQUE: The procedure, risks (including but not limited to bleeding, infection, organ damage ), benefits, and alternatives were explained to the patient and family. Questions regarding the procedure were encouraged and answered. The family understands and consents to the procedure. Survey ultrasound of the abdomen was performed and an appropriate skin entry site was identified. Skin site was marked, prepped with Betadine, and draped in usual sterile fashion, and infiltrated locally with 1% lidocaine. Intravenous Fentanyl 178mcg and Versed 2mg  were administered as conscious sedation during continuous monitoring of the patient's level of consciousness and physiological / cardiorespiratory status by the radiology RN, with a total moderate sedation time of 10 minutes. Under real-time ultrasound guidance, gallbladder was accessed using a transhepatic approach with a 21-gauge needle. Ultrasound image documentation was saved. Bile returned through the hub. Needle was exchanged over a 018 guidewire for transitional dilator which allowed placement of 035 J wire. Over this, a 10.2 French pigtail catheter was advanced and formed centrally in the gallbladder lumen. Small contrast injection confirmed appropriate position. Catheter secured externally with 0 Prolene suture and placed external drain bag. Patient tolerated the procedure well. COMPLICATIONS: COMPLICATIONS none IMPRESSION: 1. Technically successful percutaneous  cholecystostomy tube placement with ultrasound and fluoroscopic guidance. Electronically Signed   By: Lucrezia Europe M.D.   On: 09/18/2019 14:56   DG Abd Portable 1V  Result Date: 09/20/2019 CLINICAL DATA:  Biliary drainage tube. EXAM: PORTABLE ABDOMEN - 1 VIEW COMPARISON:  September 18, 2019. FINDINGS: The bowel gas pattern is normal. Percutaneous drainage catheter is noted in right upper quadrant. No radio-opaque calculi or other significant radiographic abnormality are seen. IMPRESSION: Percutaneous drainage catheter is noted in right upper quadrant. Whether or not catheter tip is positioned within the gallbladder lumen cannot be determined by this radiograph. Electronically Signed   By: Marijo Conception M.D.   On: 09/20/2019 11:17   ECHOCARDIOGRAM COMPLETE  Result Date: 09/19/2019   ECHOCARDIOGRAM REPORT   Patient Name:   Derrick [name] Date of Exam: 09/19/2019 Medical Rec #:  [id number]   Height:       72.0 in Accession #:    [id number]  Weight:       178.1 lb Date of Birth:  May 04, 1932  BSA:          2.03 m Patient Age:    42 years  BP:           137/69 mmHg Patient Gender: M           HR:           94 bpm. Exam Location:  Inpatient Procedure: 2D Echo Indications:    Atrial Fibrillation 427.31 / I48.91  History:        Patient has prior history of Echocardiogram examinations, most                 recent 12/12/2018. CAD; Risk Factors:Hypertension. AKI                 Sepsis.  Sonographer:    Vikki Ports Turrentine Referring Phys: [id number] DANIEL V THOMPSON  Sonographer Comments: Suboptimal subcostal window due to patient body habitus. IMPRESSIONS  1. Left ventricular ejection fraction, by visual estimation, is 55 to 60%. The left ventricle has normal function. There is mildly increased left ventricular hypertrophy.  2. Left ventricular diastolic parameters are indeterminate.  3. The left ventricle has no regional wall motion abnormalities.  4. Global right ventricle has normal systolic function.The right ventricular  size is normal. No increase in right ventricular wall thickness.  5. Left atrial size was moderately dilated.  6. Right atrial size was normal.  7. The mitral valve is normal in structure. Mild to moderate mitral valve regurgitation.  8. Very eccentric anterior directed MR jet. The degree of MR may be underestimated due to the eccentrity of the jet. The MV to AV VTI ratio is 0.6 suggesting mild MR.  9. The tricuspid valve is normal in structure. Tricuspid valve regurgitation is trivial. 10. The aortic valve is tricuspid. Aortic valve regurgitation is not visualized. No evidence of aortic valve sclerosis or stenosis. 11. Pulmonic regurgitation is mild. 12. The pulmonic valve was not well visualized. Pulmonic valve regurgitation is mild. 13. The interatrial septum was not well visualized. FINDINGS  Left Ventricle: Left ventricular ejection fraction, by visual estimation, is 55 to 60%. The left ventricle has normal function. The left ventricle has no regional wall motion abnormalities. There is mildly increased left ventricular hypertrophy. Left ventricular diastolic parameters are indeterminate. Right Ventricle: The right ventricular size is normal. No increase in right ventricular wall thickness. Global RV systolic function is has normal systolic function. Left Atrium: Left atrial size was moderately dilated. Right Atrium: Right atrial size was normal in size Pericardium: There is no evidence of pericardial effusion. Mitral Valve: The mitral valve is normal in structure. Mild to moderate mitral valve regurgitation. Very eccentric anterior directed MR jet. The degree of MR may be underestimated due to the eccentrity of the jet. The MV to AV VTI ratio is 0.6 suggesting  mild MR. Tricuspid Valve: The tricuspid valve is normal in structure. Tricuspid valve regurgitation is trivial. Aortic Valve: The aortic valve is tricuspid. Aortic valve regurgitation is not visualized. The aortic valve is structurally normal, with no  evidence of sclerosis or stenosis. Aortic valve mean gradient measures 7.3 mmHg. Aortic valve peak gradient measures 13.7 mmHg. Aortic valve area, by VTI measures 1.77 cm. Pulmonic Valve: The pulmonic valve was not well visualized. Pulmonic valve regurgitation is mild. Pulmonic regurgitation is mild. No evidence of pulmonic stenosis. Aorta: The aortic root is normal in size and structure. Pulmonary Artery: Indeterminate PASP, inadequate TR jet. Venous: The inferior vena cava was not well visualized. IAS/Shunts: The interatrial septum was not well visualized.  LEFT VENTRICLE PLAX 2D LVIDd:         4.50  cm LVIDs:         3.30 cm LV PW:         1.20 cm LV IVS:        1.20 cm LVOT diam:     2.10 cm LV SV:         48 ml LV SV Index:   23.80 LVOT Area:     3.46 cm  RIGHT VENTRICLE RV S prime:     16.00 cm/s TAPSE (M-mode): 1.6 cm LEFT ATRIUM              Index       RIGHT ATRIUM           Index LA diam:        4.60 cm  2.27 cm/m  RA Area:     13.90 cm LA Vol (A2C):   103.0 ml 50.79 ml/m RA Volume:   27.40 ml  13.51 ml/m LA Vol (A4C):   52.3 ml  25.79 ml/m LA Biplane Vol: 78.9 ml  38.90 ml/m  AORTIC VALVE AV Area (Vmax):    1.72 cm AV Area (Vmean):   1.81 cm AV Area (VTI):     1.77 cm AV Vmax:           185.22 cm/s AV Vmean:          124.794 cm/s AV VTI:            0.352 m AV Peak Grad:      13.7 mmHg AV Mean Grad:      7.3 mmHg LVOT Vmax:         92.03 cm/s LVOT Vmean:        65.200 cm/s LVOT VTI:          0.180 m LVOT/AV VTI ratio: 0.51  AORTA Ao Root diam: 3.40 cm MITRAL VALVE                        TRICUSPID VALVE MV Area (PHT): 3.91 cm             TR Peak grad:   13.4 mmHg MV VTI:        0.21 m               TR Vmax:        183.00 cm/s MV PHT:        56.26 msec MV Decel Time: 194 msec             SHUNTS MV E velocity: 84.27 cm/s 103 cm/s  Systemic VTI:  0.18 m MV A velocity: 79.70 cm/s 70.3 cm/s Systemic Diam: 2.10 cm MV E/A ratio:  1.06       1.5  Carlyle Dolly MD Electronically signed by Carlyle Dolly MD Signature Date/Time: 09/19/2019/12:26:36 PM    Final     Labs:  CBC: Recent Labs    09/19/19 0158 09/20/19 0214 09/21/19 0214 09/22/19 0638  WBC 12.9* 8.6 9.6 11.6*  HGB 8.6* 8.8* 7.9* 10.2*  HCT 29.0* 27.7* 24.6* 32.6*  PLT 205 208 199 261    COAGS: Recent Labs    11/24/18 1122 07/04/19 1223 09/18/19 0905  INR 0.96 1.1 1.2  APTT  --  26  --     BMP: Recent Labs    09/20/19 0214 09/21/19 0214 09/21/19 1555 09/22/19 0500 09/22/19 0638  NA 138 142  --  139 140  K 3.1* 2.8* 4.1 4.5 3.3*  CL 105 108  --  106 108  CO2 24 25  --  23 24  GLUCOSE 130* 88  --  114* 108*  BUN 16 11  --  12 11  CALCIUM 8.5* 8.5*  --  8.3* 8.5*  CREATININE 0.83 0.86  --  0.96 0.87  GFRNONAA >60 >60  --  >60 >60  GFRAA >60 >60  --  >60 >60    LIVER FUNCTION TESTS: Recent Labs    09/20/19 0214 09/21/19 0214 09/22/19 0500 09/22/19 0638  BILITOT 0.9 1.0 2.1* 1.3*  AST 27 30 57* 29  ALT 59* 51* 53* 54*  ALKPHOS 76 65 67 64  PROT 5.6* 5.4* 6.2* 6.0*  ALBUMIN 2.4* 2.4* 2.8* 2.8*    Assessment and Plan:  85 y/o M s/p percutaneous cholecystostomy placed 12/11 by Dr. Vernard Gambles seen today for drain follow up. As noted yesterday suture is broken however the drain does not appear to be retracted and appears to be functioning well on my exam today - no concerns reported by floor staff. Output appears bilious as expected, 250 cc in last 24H per I/O.  Continue TID flushes with 3-5 cc NS, record output once daily, call IR if drain becomes difficult to flush. Would recommend continuing abdominal binder +/- wrist restraints for now to prevent drain from being incidentally pulled out.   IR will continue to follow - please call with questions or concerns.   Electronically Signed: Joaquim Nam, PA-C 09/22/2019, 2:13 PM   I spent a total of 15 Minutes at the the patient's bedside AND on the patient's hospital floor or unit, greater than 50% of which was counseling/coordinating  care for percutaneous cholecystostomy follow up.

## 2019-09-22 NOTE — Progress Notes (Addendum)
PROGRESS NOTE    Derrick [name]  [id number] DOB: 1932/08/06 DOA: 09/16/2019 PCP: [name], MD    Brief Narrative:  HPI per [name] is a 85 y.o. male with medical history significant of RA on prednisone and hydroxychloroquine, CAD and PAD, HTN.  Patient presented to [facility] ED with c/o periumbilical to lower abd pain, onset 3 days ago.  Associated with N/V.  No change in BM.  No urinary complaints.  Went to PCP today and referred to ED.   ED Course: In the ED TTP periumbilically and suprapubically without rebound or guarding.  He is septic with WBC 17k, Tm 101.6, HR 134.  Not hypotensive yet though.  Given 2L bolus.  LFTs noted to be significantly elevated with AST 118 and ALT 159.  COVID RT-PCR is negative.  Initial lactate 2.9, improves to 1.9 after IVF.  CT abd / pelvis: 1) lung bases are clear 2) findings worrisome for acute cholecystitis  [place] abd: neg for acute cholecystitis.  gen surg consulted: put on ABx, admit, get HIDA, and consult them if positive.  Although EDP note nor transfer note makes any mention of encephalopathy while in ED, by the time he arrives to the SDU here at [facility], he is encephalopathic.   Assessment & Plan:   Principal Problem:   Sepsis ([place]) Active Problems:   E coli bacteremia   Acute cholecystitis   Atrial fibrillation with RVR (HCC)   HTN (hypertension)   GERD (gastroesophageal reflux disease)   Stage 3 chronic kidney disease   CAD (coronary artery disease)   Osteomyelitis (HCC)   Acute metabolic encephalopathy   A-fib (HCC)   Transaminitis   Nausea and vomiting   Rheumatoid arthritis ([place])  1 sepsis secondary to acute cholecystitis and E. coli bacteremia.  Patient presented with periumbilical to lower abdominal pain, fevers, nausea and vomiting.  Abdominal ultrasound done in the ED negative for acute cholecystitis.  CT abdomen and pelvis done worrisome for acute cholecystitis.  Patient noted to have a  transaminitis, rapidly progressive encephalopathy on admission, with positive blood cultures of E. coli.  Patient on IV fluids.  HIDA scan ordered and consistent with acute cholecystitis with cystic duct obstruction.  General surgical consultation obtained and general surgery recommended cholecystostomy tube placement at this time which would likely be in for about 6 to 8 weeks.  Per general surgery will likely need cardiac evaluation for preop clearance to determine whether patient is surgical candidate at that time to undergo cholecystectomy.  Patient status post cholecystostomy tube placement by IR on 09/18/2019 with bilious drainage.  LFTs trending down.  Leukocytosis trending down.  Patient with a E. coli bacteremia and sensitivities returned and sensitive to third-generation cephalosporins, quinolones, gentamicin, imipenem, Bactrim, Zosyn.  Continue empiric IV Zosyn.  Continue IV fluids.  Transition from IV Solu-Cortef to oral prednisone 60 mg daily and taper back to home dose. Patient noted to be on Plaquenil and chronic steroids for RA and steroid started due to worsening encephalopathy.  GI reviewed chart and due to HIDA scan results at this time was deferring to general surgery recommending that if patient does not improve with percutaneous drain or if LFTs continue to rise patient will need a MRCP and GI to be called back to reconsult.  GI formally consulted on patient on 09/18/2019.  IR has reassessed cholecystostomy tube as patient noted to have gotten confused overnight and pulled tube.  Currently on full liquid diet.  Defer diet advancement  to general surgery.  General surgery following and appreciate input and recommendations.  2.  E. coli bacteremia Likely secondary to acute cholecystitis.  Sensitivities resulted and sensitive to third-generation cephalosporins, quinolones, gentamicin, imipenem, Bactrim, Zosyn.  Slowly improving currently afebrile.  Leukocytosis trending down.  Continue empiric  IV Zosyn.  Will likely need 10 to 14-day treatment of antibiotic.  3.  Rheumatoid arthritis Plaquenil and prednisone on hold.  Patient was started on IV stress dose steroids.  Solu-Cortef was decreased to every 12 hours on 09/18/2019.  Transition from IV Solu-Cortef to oral prednisone 60 mg daily and taper back to home dose. Likely resume Plaquenil once acute infection has resolved in the outpatient setting.  4.  History of atrial fibrillation/A. fib with RVR Patient noted to go into A. fib with RVR post cholecystostomy tube placement the evening of 09/18/2019 and had to be placed on a Cardizem drip.  Heart rate was better controlled on 09/19/2019 and patient initially transitioned to oral Cardizem 30 mg p.o. every 6 hours.  Cardizem drip has been discontinued.  2D echo with a EF of 55 to 60%, mild LVH, no wall motion abnormalities, mild to moderate mitral valvular regurgitation.  Heart rate noted overnight(09/20/2019) to be in the 120s.  Continue current dose of oral Cardizem to 60 mg p.o. every 6 hours and monitor heart rate closely. Patient on chronic Plavix for coronary artery disease/PAD but not on chronic blood thinners.  Will resume Plavix.   5.  Acute metabolic encephalopathy/cognitive dysfunction. Likely secondary to problem #1.  Patient may have a likely mild cognitive dysfunction/possible dementia at baseline.  Patient noted to have some confusion overnight(09/20/2019) and agitation requiring Haldol per RN. Head CT negative for any acute abnormalities.  Patient with a E. coli bacteremia as well as an acute cholecystitis.  Continue empiric IV Zosyn.  Continue Zyprexa 5 mg p.o. twice daily.  Landscape architect.  Soft restraints.  DC Haldol as per RN patient with QT prolongation on 09/21/2019.  Repeat EKG with resolution of QT prolongation.  Ativan as needed. Supportive care.    6.  Coronary artery disease/PAD Stable.  Plavix on hold prior to cholecystostomy tube placement per IR. Continue home  regimen of aspirin, Lipitor, fish oil.  Will likely need outpatient evaluation by cardiology post discharge for preop clearance for possible cholecystectomy in about 6 to 8 weeks.  Resume Plavix.   7.  Hypertension Blood pressure noted to be borderline initially however has improved.  Patient also noted to have some agitation requiring Haldol. Patient was on a Cardizem drip and has been transitioned to oral Cardizem currently at 60 mg every 6 hours.   8.  Gastroesophageal reflux disease Continue PPI.  9.  Hypokalemia KCl 10 mEq every hour x4 runs.  Oral potassium 40 p.o. x1.   10.  QT prolongation Per RN patient with QT prolongation on telemetry on 09/21/2019.  EKG this morning 09/22/2019 with resolution of QTC prolongation.  Keep potassium greater than 4.  Keep magnesium greater than 2.  Haldol has been discontinued.  Ativan as needed..    DVT prophylaxis: Lovenox Code Status: Full Family Communication: Updated patient.  Updated son [name] on the telephone.   Disposition Plan: Transfer to telemetry.   Consultants:   GI: Dr. Watt Climes 09/18/2019   General surgery: Saverio Danker, PA/Dr. Marlou Starks 09/17/2019  Procedures:   CT abdomen and pelvis 09/16/2019  Right upper quadrant ultrasound 09/16/2019  Chest x-ray 09/16/2019  HIDA scan 09/17/2019  CT head 09/17/2019  IR/ultrasound percutaneous cholecystostomy cath placement per Dr. Vernard Gambles 09/18/2019  Antimicrobials:   IV Zosyn 09/16/2019   Subjective: Patient in bed with restraints on.  Pleasantly confused.  Incomprehensible speech.  Per RN patient took medications this morning.  Objective: Vitals:   09/22/19 0400 09/22/19 0528 09/22/19 0600 09/22/19 0800  BP: (!) 167/68  (!) 128/51 (!) 160/54  Pulse: (!) 113   (!) 103  Resp: 20  (!) 25 19  Temp:    98.8 F (37.1 C)  TempSrc:    Oral  SpO2: 95%  96% 98%  Weight:  77.6 kg    Height:        Intake/Output Summary (Last 24 hours) at 09/22/2019 1003 Last data filed at  09/22/2019 0902 Gross per 24 hour  Intake 1169.93 ml  Output 3050 ml  Net -1880.07 ml   Filed Weights   09/20/19 0729 09/21/19 0230 09/22/19 0528  Weight: 83.3 kg 82.7 kg 77.6 kg    Examination:  General exam: Sleeping easily arousable.  Pleasantly confused.  Dry mucous membranes.  Respiratory system: CTAB anterior lung fields.  No wheezes, no crackles, no rhonchi.  Normal respiratory effort.  Cardiovascular system: Irregularly irregular.  No JVD.  No lower extremity edema.   Gastrointestinal system: Abdomen is soft, positive bowel sounds, some tenderness to palpation right upper quadrant.  Cholecystostomy tube intact.  Abdominal binder on. Central nervous system: Alert to self only.  Moving extremities spontaneously.  Pleasantly confused. Extremities: s/p right transmetartasal amputation. Skin: No rashes, lesions or ulcers Psychiatry: Judgement and insight appear poor. Mood & affect appropriate.     Data Reviewed: I have personally reviewed following labs and imaging studies  CBC: Recent Labs  Lab 09/16/19 1113 09/18/19 0217 09/19/19 0158 09/20/19 0214 09/21/19 0214 09/22/19 0638  WBC 17.6* 16.1* 12.9* 8.6 9.6 11.6*  NEUTROABS 15.0* 14.3* 11.3* 7.6 6.7  --   HGB 11.3* 8.0* 8.6* 8.8* 7.9* 10.2*  HCT 36.6* 26.6* 29.0* 27.7* 24.6* 32.6*  MCV 82.4 86.4 85.8 83.9 80.9 84.2  PLT 277 176 205 208 199 0000000   Basic Metabolic Panel: Recent Labs  Lab 09/19/19 0158 09/20/19 0214 09/21/19 0214 09/21/19 1555 09/22/19 0500 09/22/19 0638  NA 138 138 142  --  139 140  K 3.9 3.1* 2.8* 4.1 4.5 3.3*  CL 106 105 108  --  106 108  CO2 23 24 25   --  23 24  GLUCOSE 139* 130* 88  --  114* 108*  BUN 20 16 11   --  12 11  CREATININE 0.95 0.83 0.86  --  0.96 0.87  CALCIUM 8.5* 8.5* 8.5*  --  8.3* 8.5*  MG 1.9 2.1  --   --  2.3 2.1   GFR: Estimated Creatinine Clearance: 69.4 mL/min (by C-G formula based on SCr of 0.87 mg/dL). Liver Function Tests: Recent Labs  Lab 09/19/19 0158  09/20/19 0214 09/21/19 0214 09/22/19 0500 09/22/19 0638  AST 34 27 30 57* 29  ALT 74* 59* 51* 53* 54*  ALKPHOS 89 76 65 67 64  BILITOT 0.6 0.9 1.0 2.1* 1.3*  PROT 5.5* 5.6* 5.4* 6.2* 6.0*  ALBUMIN 2.4* 2.4* 2.4* 2.8* 2.8*   Recent Labs  Lab 09/16/19 1113  LIPASE 24   No results for input(s): AMMONIA in the last 168 hours. Coagulation Profile: Recent Labs  Lab 09/18/19 0905  INR 1.2   Cardiac Enzymes: No results for input(s): CKTOTAL, CKMB, CKMBINDEX, TROPONINI in the last 168 hours. BNP (last 3 results) No results  for input(s): PROBNP in the last 8760 hours. HbA1C: No results for input(s): HGBA1C in the last 72 hours. CBG: Recent Labs  Lab 09/21/19 1542 09/21/19 1918 09/21/19 2334 09/22/19 0324 09/22/19 0722  GLUCAP 107* 107* 112* 100* 99   Lipid Profile: No results for input(s): CHOL, HDL, LDLCALC, TRIG, CHOLHDL, LDLDIRECT in the last 72 hours. Thyroid Function Tests: No results for input(s): TSH, T4TOTAL, FREET4, T3FREE, THYROIDAB in the last 72 hours. Anemia Panel: No results for input(s): VITAMINB12, FOLATE, FERRITIN, TIBC, IRON, RETICCTPCT in the last 72 hours. Sepsis Labs: Recent Labs  Lab 09/16/19 1239 09/16/19 1446 09/17/19 0002 09/17/19 0233  LATICACIDVEN 2.9* 1.9 1.5 1.4    Recent Results (from the past 240 hour(s))  Blood culture (routine x 2)     Status: Abnormal   Collection Time: 09/16/19 12:15 PM   Specimen: Right Antecubital; Blood  Result Value Ref Range Status   Specimen Description   Final    RIGHT ANTECUBITAL Performed at [facility], [address] [place], [place] [address]    Special Requests   Final    BOTTLES DRAWN AEROBIC AND ANAEROBIC Blood Culture adequate volume Performed at [facility], [address] [place], [place] [address]    Culture  Setup Time   Final    IN BOTH AEROBIC AND ANAEROBIC BOTTLES GRAM NEGATIVE RODS CRITICAL VALUE NOTED.  VALUE IS CONSISTENT WITH PREVIOUSLY REPORTED AND  CALLED VALUE.    Culture (A)  Final    ESCHERICHIA COLI SUSCEPTIBILITIES PERFORMED ON PREVIOUS CULTURE WITHIN THE LAST 5 DAYS. Performed at [facility], [place] [address] [place],  [address]    Report Status 09/19/2019 FINAL  Final  Blood culture (routine x 2)     Status: Abnormal   Collection Time: 09/16/19 12:30 PM   Specimen: BLOOD LEFT WRIST  Result Value Ref Range Status   Specimen Description   Final    BLOOD LEFT WRIST Performed at [facility], [address] [place], [place] [address]    Special Requests   Final    BOTTLES DRAWN AEROBIC AND ANAEROBIC Blood Culture adequate volume Performed at [facility], [address] [place], [place] [address]    Culture  Setup Time   Final    IN BOTH AEROBIC AND ANAEROBIC BOTTLES GRAM NEGATIVE RODS CRITICAL RESULT CALLED TO, READ BACK BY AND VERIFIED WITH: B GREEN PHARMD 09/17/19 0428 JDW Performed at [facility], [place] [address], [place], [place] [address]    Culture ESCHERICHIA COLI (A)  Final   Report Status 09/19/2019 FINAL  Final   Organism ID, Bacteria ESCHERICHIA COLI  Final      Susceptibility   Escherichia coli - MIC*    AMPICILLIN >=32 RESISTANT Resistant     CEFAZOLIN >=64 RESISTANT Resistant     CEFEPIME <=1 SENSITIVE Sensitive     CEFTAZIDIME <=1 SENSITIVE Sensitive     CEFTRIAXONE <=1 SENSITIVE Sensitive     CIPROFLOXACIN <=0.25 SENSITIVE Sensitive     GENTAMICIN <=1 SENSITIVE Sensitive     IMIPENEM <=0.25 SENSITIVE Sensitive     TRIMETH/SULFA <=20 SENSITIVE Sensitive     AMPICILLIN/SULBACTAM >=32 RESISTANT Resistant     PIP/TAZO 8 SENSITIVE Sensitive     * ESCHERICHIA COLI  Blood Culture ID Panel (Reflexed)     Status: Abnormal   Collection Time: 09/16/19 12:30 PM  Result Value Ref Range Status   Enterococcus species NOT  DETECTED NOT DETECTED Final   Listeria monocytogenes NOT DETECTED NOT DETECTED Final   Staphylococcus species NOT DETECTED NOT DETECTED Final    Staphylococcus aureus (BCID) NOT DETECTED NOT DETECTED Final   Streptococcus species NOT DETECTED NOT DETECTED Final   Streptococcus agalactiae NOT DETECTED NOT DETECTED Final   Streptococcus pneumoniae NOT DETECTED NOT DETECTED Final   Streptococcus pyogenes NOT DETECTED NOT DETECTED Final   Acinetobacter baumannii NOT DETECTED NOT DETECTED Final   Enterobacteriaceae species DETECTED (A) NOT DETECTED Final    Comment: Enterobacteriaceae represent a large family of gram-negative bacteria, not a single organism. CRITICAL RESULT CALLED TO, READ BACK BY AND VERIFIED WITH: B GREEN PHARMD 09/17/19 0428 JDW    Enterobacter cloacae complex NOT DETECTED NOT DETECTED Final   Escherichia coli DETECTED (A) NOT DETECTED Final    Comment: CRITICAL RESULT CALLED TO, READ BACK BY AND VERIFIED WITH: B GREEN PHARMD 09/17/19 0428 JDW    Klebsiella oxytoca NOT DETECTED NOT DETECTED Final   Klebsiella pneumoniae NOT DETECTED NOT DETECTED Final   Proteus species NOT DETECTED NOT DETECTED Final   Serratia marcescens NOT DETECTED NOT DETECTED Final   Carbapenem resistance NOT DETECTED NOT DETECTED Final   Haemophilus influenzae NOT DETECTED NOT DETECTED Final   Neisseria meningitidis NOT DETECTED NOT DETECTED Final   Pseudomonas aeruginosa NOT DETECTED NOT DETECTED Final   Candida albicans NOT DETECTED NOT DETECTED Final   Candida glabrata NOT DETECTED NOT DETECTED Final   Candida krusei NOT DETECTED NOT DETECTED Final   Candida parapsilosis NOT DETECTED NOT DETECTED Final   Candida tropicalis NOT DETECTED NOT DETECTED Final    Comment: Performed at [facility], [place] [address], [place], [place] [address]  SARS CORONAVIRUS 2 (TAT 6-24 HRS) Nasopharyngeal Nasopharyngeal Swab     Status: None   Collection Time: 09/16/19 12:50 PM   Specimen: Nasopharyngeal Swab  Result Value Ref Range Status   SARS Coronavirus 2 NEGATIVE NEGATIVE Final    Comment: (NOTE) SARS-CoV-2 target nucleic acids are NOT  DETECTED. The SARS-CoV-2 RNA is generally detectable in upper and lower respiratory specimens during the acute phase of infection. Negative results do not preclude SARS-CoV-2 infection, do not rule out co-infections with other pathogens, and should not be used as the sole basis for treatment or other patient management decisions. Negative results must be combined with clinical observations, patient history, and epidemiological information. The expected result is Negative. Fact Sheet for Patients: SugarRoll.be Fact Sheet for Healthcare Providers: https://www.woods-mathews.com/ This test is not yet approved or cleared by the [place] FDA and  has been authorized for detection and/or diagnosis of SARS-CoV-2 by FDA under an Emergency Use Authorization (EUA). This EUA will remain  in effect (meaning this test can be used) for the duration of the COVID-19 declaration under Section 56 4(b)(1) of the Act, 21 U.S.C. section 360bbb-3(b)(1), unless the authorization is terminated or revoked sooner. Performed at [facility], [place] [address], [place],  [address]   MRSA PCR Screening     Status: None   Collection Time: 09/16/19 11:34 PM   Specimen: Nasal Mucosa; Nasopharyngeal  Result Value Ref Range Status   MRSA by PCR NEGATIVE NEGATIVE Final    Comment:        The GeneXpert MRSA Assay (FDA approved for NASAL specimens only), is one component of a comprehensive MRSA colonization surveillance program. It is not intended to diagnose MRSA infection nor to guide or monitor treatment for MRSA infections. Performed at [facility]  [place] [address], [place], [place] [address]          Radiology Studies: DG Abd Portable 1V  Result Date: 09/20/2019 CLINICAL DATA:  Biliary drainage tube. EXAM: PORTABLE ABDOMEN - 1 VIEW COMPARISON:  September 18, 2019. FINDINGS: The bowel gas pattern is normal. Percutaneous drainage  catheter is noted in right upper quadrant. No radio-opaque calculi or other significant radiographic abnormality are seen. IMPRESSION: Percutaneous drainage catheter is noted in right upper quadrant. Whether or not catheter tip is positioned within the gallbladder lumen cannot be determined by this radiograph. Electronically Signed   By: Marijo Conception M.D.   On: 09/20/2019 11:17        Scheduled Meds: . aspirin  81 mg Oral Daily  . atorvastatin  40 mg Oral Daily  . Chlorhexidine Gluconate Cloth  6 each Topical Daily  . clopidogrel  75 mg Oral Daily  . diltiazem  60 mg Oral Q6H  . docusate sodium  250 mg Oral Daily  . enoxaparin (LOVENOX) injection  40 mg Subcutaneous Daily  . finasteride  5 mg Oral QPM  . insulin aspart  0-9 Units Subcutaneous Q4H  . mouth rinse  15 mL Mouth Rinse BID  . OLANZapine zydis  5 mg Oral BID  . omega-3 acid ethyl esters  1 g Oral Daily  . pantoprazole  40 mg Oral BID  . predniSONE  60 mg Oral QAC breakfast  . sertraline  25 mg Oral Daily  . sodium chloride flush  5 mL Intracatheter Q8H   Continuous Infusions: . sodium chloride Stopped (09/16/19 1347)  . 0.9 % NaCl with KCl 40 mEq / L 75 mL/hr at 09/22/19 0902  . piperacillin-tazobactam (ZOSYN)  IV 12.5 mL/hr at 09/22/19 0902  . potassium chloride 10 mEq (09/22/19 0903)     LOS: 6 days    Time spent: 40 minutes    Irine Seal, MD [facility]  If 7PM-7AM, please contact night-coverage [email or web address] 09/22/2019, 10:03 AM

## 2019-09-22 NOTE — TOC Initial Note (Addendum)
Transition of Care [facility]) - Initial/Assessment Note    Patient Details  Name: Derrick [name] MRN: [id number] Date of Birth: 03-19-1932  Transition of [facility]) CM/SW Contact:    Lynnell Catalan, RN Phone Number: 09/22/2019, 12:59 PM  Clinical Narrative:                 Pt from home with wife. SNF recommended by PT. Pt has been to [facility] in the past.  At this time pt is confused and in restraints, TOC will continue to follow and assist with DC as needed.   12/16 Addendum:  Spoke with wife via phone. She states that she thinks pt may need snf for a short time unless he clears mentally. She says that he was independent with ADLs and walked with a walker prior to admission. She states she will think it over and talk with her son about pt going to snf.    Admission diagnosis:  Delirium [R41.0] Abdominal pain, unspecified abdominal location [R10.9] Fever, unspecified fever cause [R50.9] Patient Active Problem List   Diagnosis Date Noted  . Atrial fibrillation with RVR ([place]) 09/19/2019  . Acute cholecystitis 09/18/2019  . E coli bacteremia 09/17/2019  . Sepsis ([place]) 09/16/2019  . Transaminitis 09/16/2019  . Nausea and vomiting 09/16/2019  . Rheumatoid arthritis ([place]) 09/16/2019  . Arm pain 07/07/2019  . A-fib ([place]) 12/12/2018  . Acute blood loss anemia 11/28/2018  . Tachycardia 11/27/2018  . Gross hematuria 11/27/2018  . Acute metabolic encephalopathy [id number]  . Spinal stenosis, lumbar region with neurogenic claudication 11/26/2018  . Ulcer of right foot limited to breakdown of skin ([place])   . PAD (peripheral artery disease) ([place]) 09/23/2017  . Syncope and collapse 09/19/2017  . Osteomyelitis ([place])   . Malaise 09/09/2017  . Anorexia 09/09/2017  . Hypotension 09/09/2017  . Volume depletion 09/09/2017  . AKI (acute kidney injury) ([place]) 09/09/2017  . Hyponatremia 06/12/2017  . COPD (chronic obstructive pulmonary disease) ([place])   . Lacunar infarction ([place])   . Gangrene of  toe of right foot ([place])   . Pre-syncope 11/21/2016  . Dyspnea on exertion 03/03/2016  . H/O unilateral Rt nephrectomy 03/03/2016  . Asymmetrical right sensorineural hearing loss 02/24/2016  . Bilateral hearing loss 02/24/2016  . Bilateral impacted cerumen 02/24/2016  . Arrhythmia 11/09/2015  . CAD (coronary artery disease) 11/09/2015  . Abnormal ECG 10/22/2013  . Dyspnea 10/22/2013  . Chest pain 11/18/2011  . Unstable angina ([place]) 11/18/2011  . Thrombocytopenia ([place]) 11/18/2011  . Anemia 11/18/2011  . Stage 3 chronic kidney disease 11/18/2011  . HTN (hypertension)   . High cholesterol   . GERD (gastroesophageal reflux disease)   . BPH (benign prostatic hyperplasia)    PCP:  [name], MD Pharmacy:   [facility], [address] [place] [address] Phone: [phone] Fax: [phone]     Social Determinants of Health (SDOH) Interventions    Readmission Risk Interventions Readmission Risk Prevention Plan 09/22/2019  Transportation Screening Complete  Medication Review ([place]) Complete  PCP or Specialist appointment within 3-5 days of discharge Complete  HRI or Home Care Consult Complete  SW Recovery Care/Counseling Consult Complete  Palliative Care Screening Not [place] Complete  Some recent data might [name] hidden

## 2019-09-22 NOTE — Progress Notes (Signed)
Physical Therapy Treatment Patient Details Name: Derrick [name] MRN: [id number] DOB: May 31, 1932 Today's Date: 09/22/2019    History of Present Illness Pt admitted through ED 09/16/19 with abdominal pain.  Pt dx with sesis 2* ecoli bacteremia and acute cholecystitis.  Pt with hx of RA, memory loss, CKD, CAD, lumbar lami and R transmetatarsal amputation (18)    PT Comments    Pt restless this session, with elements of paranoia today stating "you are keeping me here, and are trying to kill me". Pt assured of his safety throughout session. Pt requires min-max assist +2 for mobility this session, tolerating 25 ft ambulation with min assist for steadying and guiding pt/RW. VSS during session. Will continue to follow acutely, and continue to recommend SNF level of care post-acutely.    Follow Up Recommendations  SNF     Equipment Recommendations  None recommended by PT    Recommendations for Other Services       Precautions / Restrictions Precautions Precautions: Fall Restrictions Weight Bearing Restrictions: No    Mobility  Bed Mobility Overal bed mobility: Needs Assistance Bed Mobility: Supine to Sit;Sit to Supine     Supine to sit: Total assist;+2 for physical assistance;HOB elevated;+2 for safety/equipment Sit to supine: Min assist;HOB elevated;+2 for safety/equipment   General bed mobility comments: total assist for supine to sit for trunk and LE management, pt contributing 0% to assist in moving to EOB. Min assist for sit to supine for LE lifting into bed, scooting pt up in bed.  Transfers Overall transfer level: Needs assistance Equipment used: Rolling walker (2 wheeled) Transfers: Sit to/from Stand Sit to Stand: +2 safety/equipment;+2 physical assistance;From elevated surface;Mod assist         General transfer comment: Mod +2 for power up, steadying, and correcting posterior leaning upon initial standing. Pt also requires bilateral LE guarding as pt with tendency to  let LEs slide forward  Ambulation/Gait Ambulation/Gait assistance: Min assist;+2 safety/equipment;+2 physical assistance Gait Distance (Feet): 25 Feet Assistive device: Rolling walker (2 wheeled) Gait Pattern/deviations: Step-through pattern;Shuffle;Trunk flexed;Drifts right/left;Decreased stride length Gait velocity: decr   General Gait Details: Min assist +2 for guiding pt and RW, steadying pt. Pt very unsteady in standing   Stairs             Wheelchair Mobility    Modified Rankin (Stroke Patients Only)       Balance Overall balance assessment: Needs assistance Sitting-balance support: Bilateral upper extremity supported;Feet supported Sitting balance-Leahy Scale: Fair Sitting balance - Comments: posterior leaning with fatigue Postural control: Posterior lean Standing balance support: Bilateral upper extremity supported Standing balance-Leahy Scale: Poor Standing balance comment: reliant on UEs on RW and outside assist to maintain balance                            Cognition Arousal/Alertness: Awake/alert Behavior During Therapy: WFL for tasks assessed/performed Overall Cognitive Status: History of cognitive impairments - at baseline                                 General Comments: Pt restless and irritable, pt stating "you are trying to keep me in this prison, you're trying to kill me". Pt assured that everyone is here to help him      Exercises      General Comments General comments (skin integrity, edema, etc.): VSS      Pertinent Vitals/Pain  Pain Assessment: Faces Faces Pain Scale: Hurts even more Pain Location: abdomen Pain Descriptors / Indicators: Sore;Grimacing;Guarding Pain Intervention(s): Limited activity within patient's tolerance;Monitored during session;Premedicated before session;Repositioned    Home Living                      Prior Function            PT Goals (current goals can now be found in  the care plan section) Acute Rehab PT Goals Patient Stated Goal: go home PT Goal Formulation: With patient Time For Goal Achievement: 10/03/19 Potential to Achieve Goals: Fair Progress towards PT goals: Progressing toward goals    Frequency    Min 2X/week      PT Plan Frequency needs to be updated    Co-evaluation              AM-PAC PT "6 Clicks" Mobility   Outcome Measure  Help needed turning from your back to your side while in a flat bed without using bedrails?: A Little Help needed moving from lying on your back to sitting on the side of a flat bed without using bedrails?: Total Help needed moving to and from a bed to a chair (including a wheelchair)?: Total Help needed standing up from a chair using your arms (e.g., wheelchair or bedside chair)?: A Lot Help needed to walk in hospital room?: A Little Help needed climbing 3-5 steps with a railing? : Total 6 Click Score: 11    End of Session Equipment Utilized During Treatment: Gait belt Activity Tolerance: Treatment limited secondary to agitation;Patient limited by fatigue Patient left: in bed;with call bell/phone within reach;with bed alarm set;with nursing/sitter in room;with restraints reapplied Nurse Communication: Mobility status PT Visit Diagnosis: Unsteadiness on feet (R26.81);Muscle weakness (generalized) (M62.81);Difficulty in walking, not elsewhere classified (R26.2);Pain Pain - Right/Left: Right Pain - part of body: (RIght side at drain site)     Time: [id number] PT Time Calculation (min) (ACUTE ONLY): 21 min  Charges:  $Gait Training: 8-22 mins                    Symone Cornman E, PT [place] Pager [phone]  Office [phone]    Kaisei Gilbo D Braedyn Kauk 09/22/2019, 1:24 PM

## 2019-09-23 DIAGNOSIS — R109 Unspecified abdominal pain: Secondary | ICD-10-CM

## 2019-09-23 DIAGNOSIS — R1010 Upper abdominal pain, unspecified: Secondary | ICD-10-CM

## 2019-09-23 LAB — CBC WITH DIFFERENTIAL/PLATELET
Abs Immature Granulocytes: 0.15 10*3/uL — ABNORMAL HIGH (ref 0.00–0.07)
Basophils Absolute: 0 10*3/uL (ref 0.0–0.1)
Basophils Relative: 0 %
Eosinophils Absolute: 0.1 10*3/uL (ref 0.0–0.5)
Eosinophils Relative: 1 %
HCT: 32.6 % — ABNORMAL LOW (ref 39.0–52.0)
Hemoglobin: 10 g/dL — ABNORMAL LOW (ref 13.0–17.0)
Immature Granulocytes: 1 %
Lymphocytes Relative: 12 %
Lymphs Abs: 1.6 10*3/uL (ref 0.7–4.0)
MCH: 25.8 pg — ABNORMAL LOW (ref 26.0–34.0)
MCHC: 30.7 g/dL (ref 30.0–36.0)
MCV: 84.2 fL (ref 80.0–100.0)
Monocytes Absolute: 1.1 10*3/uL — ABNORMAL HIGH (ref 0.1–1.0)
Monocytes Relative: 8 %
Neutro Abs: 10.8 10*3/uL — ABNORMAL HIGH (ref 1.7–7.7)
Neutrophils Relative %: 78 %
Platelets: 290 10*3/uL (ref 150–400)
RBC: 3.87 MIL/uL — ABNORMAL LOW (ref 4.22–5.81)
RDW: 16.1 % — ABNORMAL HIGH (ref 11.5–15.5)
WBC: 13.8 10*3/uL — ABNORMAL HIGH (ref 4.0–10.5)
nRBC: 0 % (ref 0.0–0.2)

## 2019-09-23 LAB — COMPREHENSIVE METABOLIC PANEL
ALT: 67 U/L — ABNORMAL HIGH (ref 0–44)
AST: 39 U/L (ref 15–41)
Albumin: 2.6 g/dL — ABNORMAL LOW (ref 3.5–5.0)
Alkaline Phosphatase: 64 U/L (ref 38–126)
Anion gap: 9 (ref 5–15)
BUN: 11 mg/dL (ref 8–23)
CO2: 22 mmol/L (ref 22–32)
Calcium: 8.6 mg/dL — ABNORMAL LOW (ref 8.9–10.3)
Chloride: 110 mmol/L (ref 98–111)
Creatinine, Ser: 0.94 mg/dL (ref 0.61–1.24)
GFR calc Af Amer: >60 mL/min (ref >60)
GFR calc non Af Amer: >60 mL/min (ref >60)
Glucose, Bld: 97 mg/dL (ref 70–99)
Potassium: 3.8 mmol/L (ref 3.5–5.1)
Sodium: 141 mmol/L (ref 135–145)
Total Bilirubin: 0.8 mg/dL (ref 0.3–1.2)
Total Protein: 5.7 g/dL — ABNORMAL LOW (ref 6.5–8.1)

## 2019-09-23 LAB — GLUCOSE, CAPILLARY
Glucose-Capillary: 124 mg/dL — ABNORMAL HIGH (ref 70–99)
Glucose-Capillary: 94 mg/dL (ref 70–99)
Glucose-Capillary: 91 mg/dL (ref 70–99)
Glucose-Capillary: 145 mg/dL — ABNORMAL HIGH (ref 70–99)
Glucose-Capillary: 139 mg/dL — ABNORMAL HIGH (ref 70–99)
Glucose-Capillary: 122 mg/dL — ABNORMAL HIGH (ref 70–99)

## 2019-09-23 LAB — MAGNESIUM: Magnesium: 1.9 mg/dL (ref 1.7–2.4)

## 2019-09-23 MED ORDER — PRO-STAT SUGAR FREE PO LIQD
30.0000 mL | Freq: Two times a day (BID) | ORAL | Status: DC
  Administered 2019-09-23 – 2019-09-29 (×11): 30 mL via ORAL
  Filled 2019-09-23 (×11): qty 30

## 2019-09-23 MED ORDER — PREDNISONE 20 MG PO TABS
40.0000 mg | ORAL_TABLET | Freq: Every day | ORAL | Status: DC
  Administered 2019-09-24: 40 mg via ORAL
  Filled 2019-09-23: qty 2

## 2019-09-23 MED ORDER — ENSURE ENLIVE PO LIQD
237.0000 mL | Freq: Two times a day (BID) | ORAL | Status: DC
  Administered 2019-09-23 – 2019-09-28 (×11): 237 mL via ORAL

## 2019-09-23 MED ORDER — ADULT MULTIVITAMIN W/MINERALS CH
1.0000 | ORAL_TABLET | Freq: Every day | ORAL | Status: DC
  Administered 2019-09-24 – 2019-09-30 (×7): 1 via ORAL
  Filled 2019-09-23 (×8): qty 1

## 2019-09-23 NOTE — Progress Notes (Signed)
Nutrition Follow-up  RD working remotely.  DOCUMENTATION CODES:   Not applicable  INTERVENTION:  - will order Ensure Enlive BID, each supplement provides 350 kcal and 20 grams of protein. - will order 30 mL Prostat BID, each supplement provides 100 kcal and 15 grams of protein. - will order daily multivitamin with minerals. - continue to encourage PO intakes and advance diet as medically feasible.    NUTRITION DIAGNOSIS:   Inadequate oral intake related to other (see comment)(current diet order) as evidenced by other (comment)(FLD does not meet estimated nutrition needs.). -revised, ongoing   GOAL:   Patient will meet greater than or equal to 90% of their needs -unable to meet  MONITOR:   PO intake, Supplement acceptance, Diet advancement, Labs, Weight trends  ASSESSMENT:   85 y.o. male with medical history significant of RA on prednisone and hydroxychloroquine, CAD, PAD, and HTN. He presented to the ED from PCP's office with complaint of periumbilical and lower abdominal pain with associated N/V that began 3 days PTA. In the ED, he was found to be septic and was given 2L IV fluids. LFTs were noted to be significantly elevated.  Diet advanced from NPO to [facility] on 12/12 at 0900 and to [place] on 12/13 at 0750. No intakes documented since diet advancement began. Will order supplements as outlined above. Weight today consistent with admission (12/9) weight.   Perc cholecystostomy placed on 12/11 due to acute cholecystitis. Patient was septic on admission 2/2 cholecystitis and bacteremia. He is noted to have acute metabolic encephalopathy/cognitive dysfunction (a/o to self only).    Labs reviewed; CBGs: 94 and 91 mg/dl, Ca: 8.6 mg/dl. Medications reviewed; 250 mg colace/day, sliding scale novolog, 1 g lovaza/day, 40 mg oral protonix BID, 10 mEq IV KCl x4 runs 12/15, 60 mg deltasone/day.  IVF; NS-40 mEq @ 75 ml/hr.   Diet Order:   Diet Order            Diet full liquid Room service  appropriate? Yes; Fluid consistency: Thin  Diet effective now              EDUCATION NEEDS:   No education needs have been identified at this time  Skin:  Skin Assessment: Reviewed RN Assessment  Last BM:  PTA/unknown  Height:   Ht Readings from Last 1 Encounters:  09/16/19 6' (1.829 m)    Weight:   Wt Readings from Last 1 Encounters:  09/23/19 79.4 kg    Ideal Body Weight:  80.9 kg  BMI:  Body mass index is 23.73 kg/m.  Estimated Nutritional Needs:   Kcal:  2020-2260 kcal  Protein:  100-115 grams  Fluid:  >/= 2 L/day     Jarome Matin, MS, RD, LDN, [facility] Inpatient Clinical Dietitian Pager # [phone] After hours/weekend pager # [phone]

## 2019-09-23 NOTE — Progress Notes (Signed)
Patient ID: Derrick [name], male   DOB: [birth year], 85 y.o.   MRN: [id number]       Subjective: Sitting up, but refusing to eat his breakfast.  He is confused.  Denies abdominal pain  ROS: unable due to confusion  Objective: Vital signs in last 24 hours: Temp:  [97.4 F (36.3 C)-98.3 F (36.8 C)] 98.1 F (36.7 C) (12/16 0700) Pulse Rate:  [38-88] 70 (12/16 0400) Resp:  [15-28] 16 (12/16 0800) BP: (117-196)/(55-111) 168/92 (12/16 0747) SpO2:  [90 %-98 %] 93 % (12/16 0800) Weight:  [79.4 kg] 79.4 kg (12/16 0637) Last BM Date: 09/22/19  Intake/Output from previous day: 12/15 0701 - 12/16 0700 In: 1661.6 [P.O.:30; I.V.:1066.6; IV Piggyback:550] Out: 751 [Urine:451; Drains:300] Intake/Output this shift: Total I/O In: -  Out: 50 [Drains:50]  PE: Abd: soft, NT, Nd, +BS, perc chole drain in place under abdominal binder with bilious output  Lab Results:  Recent Labs    09/22/19 0638 09/23/19 0500  WBC 11.6* 13.8*  HGB 10.2* 10.0*  HCT 32.6* 32.6*  PLT 261 290   BMET Recent Labs    09/22/19 0638 09/23/19 0500  NA 140 141  K 3.3* 3.8  CL 108 110  CO2 24 22  GLUCOSE 108* 97  BUN 11 11  CREATININE 0.87 0.94  CALCIUM 8.5* 8.6*   PT/INR No results for input(s): LABPROT, INR in the last 72 hours. CMP     Component Value Date/Time   NA 141 09/23/2019 0500   K 3.8 09/23/2019 0500   CL 110 09/23/2019 0500   CO2 22 09/23/2019 0500   GLUCOSE 97 09/23/2019 0500   BUN 11 09/23/2019 0500   CREATININE 0.94 09/23/2019 0500   CREATININE 1.68 (H) 04/09/2016 1037   CALCIUM 8.6 (L) 09/23/2019 0500   PROT 5.7 (L) 09/23/2019 0500   ALBUMIN 2.6 (L) 09/23/2019 0500   AST 39 09/23/2019 0500   ALT 67 (H) 09/23/2019 0500   ALKPHOS 64 09/23/2019 0500   BILITOT 0.8 09/23/2019 0500   GFRNONAA >60 09/23/2019 0500   GFRAA >60 09/23/2019 0500   Lipase     Component Value Date/Time   LIPASE 24 09/16/2019 1113       Studies/Results: No results  found.  Anti-infectives: Anti-infectives (From admission, onward)   Start     Dose/Rate Route Frequency Ordered Stop   09/17/19 1000  hydroxychloroquine (PLAQUENIL) tablet 200 mg  Status:  Discontinued     200 mg Oral Daily 09/16/19 2340 09/16/19 2352   09/17/19 0000  piperacillin-tazobactam (ZOSYN) IVPB 3.375 g     3.375 g 12.5 mL/hr over 240 Minutes Intravenous Every 8 hours 09/16/19 2349     09/16/19 1230  piperacillin-tazobactam (ZOSYN) IVPB 3.375 g     3.375 g 100 mL/hr over 30 Minutes Intravenous  Once 09/16/19 1227 09/16/19 1347       Assessment/Plan Atrial fibrillation Coronary artery disease Peripheral artery disease Rheumatoid arthritis History of CVA History of renal cell carcinoma, status post nephrectomy History of prostate cancer Hypertension Chronic kidney disease  Acute cholecystitis with bacteremia and sepsis -abdominal binder to help minimize pulling at tube.   -cont perc chole drain -diet as tolerates -surgically stable -abx per ID given bacteremia -patient will need to follow up with Dr. Marlou Starks in 6 weeks  FEN - can advance diet as tolerated VTE -Plavix, Lovenox ID -Zosyn, per ID Follow up: Dr. Marlou Starks   LOS: 7 days    Henreitta Cea , [facility]  09/23/2019, 8:44 AM Please see Amion for pager number during day hours 7:00am-4:30pm

## 2019-09-23 NOTE — Progress Notes (Signed)
[facility]                                                                              Patient Demographics  Derrick [name], is a 85 y.o. male, DOB - 10-01-1932, RXV:[id number]  Admit date - 09/16/2019   Admitting Physician Eugenie Filler, MD  Outpatient Primary MD for the patient is [name], MD  Outpatient specialists:   LOS - 7  days   Medical records reviewed and are as summarized below:    Chief Complaint  Patient presents with  . Abdominal Pain       Brief summary   Derrick Corro Odomis a 86 y.o.malewith medical history significant ofRA on prednisone and hydroxychloroquine, CAD and PAD, HTN. Patient presented to [facility] ED with c/o periumbilical to lower abd pain, onset 3 days ago. Associated with N/V. No change in BM. No urinary complaints. Went to PCP today and referred to ED. In ED, patient was found to be septic with WBC 17 K, temp 101.6 F, heart rate 134.  Acute transaminitis, COVID-19 negative.  CT abdomen pelvis worrisome for acute cholecystitis. Patient was admitted, general surgery was consulted  Assessment & Plan    Sepsis secondary to acute cholecystitis and E. coli bacteremia -Patient met sepsis criteria at the time of admission with fevers, leukocytosis, tachycardia, source likely due to acute cholecystitis -CT abdomen pelvis showed acute cholecystitis, patient also had transaminitis, rapidly progressive encephalopathy on admission -Blood cultures positive for E. coli.  Patient was placed on n.p.o. status, IV fluids and empiric IV antibiotics. General surgery was consulted -Status post cholecystostomy tube placed by IR on 12/11 with bilious drainage.  LFTs trending down -Antibiotics narrowed to IV Zosyn per sensitivities -Transition from IV Solu-Cortef to oral prednisone (Patient noted to be on Plaquenil and chronic steroids for RA and steroid started due to worsening encephalopathy).  -GI was consulted on 12/11, recommended to continue  current management.  Defer to general surgery and IR, if patient does not improve with PERC drain or if LFTs continues to trend up, patient will need MRCP. -Continue full liquid diet  E. coli bacteremia -Likely due to acute cholecystitis -Continue IV Zosyn, will likely need 10 to 14-day antibiotic course   Rheumatoid arthritis -Plaquenil and prednisone were placed on hold and patient was started on IV stress dose steroids -IV Solu-Cortef as now being transitioned to oral prednisone and slowly taper back to home dose (5 mg) -Restart Plaquenil outpatient once acute infection has resolved - will taper prednisone to 40 mg, starting tomorrow  A. fib with RVR -Patient was noted to be in atrial fibrillation with RVR post cholecystostomy tube on the evening of 12/11 and had to be placed on Cardizem drip -Heart rate now controlled, Cardizem drip has been off, currently tolerating oral Cardizem 60 mg every 6 hours -Continue chronic Plavix for CAD/PAD but not on chronic blood thinners   Acute metabolic encephalopathy, likely has underlying dementia at baseline -Oriented to self only, patient may have underlying mild dementia now worsening with sepsis, delirium and sundowning -Head CT negative for acute abnormalities -Continue IV Zosyn, sitter, soft restraints as needed  Coronary disease/PAD -Currently no chest pain or shortness of breath, stable -Continue home regimen of aspirin, Lipitor, fish oil.  Continue Plavix -Outpatient follow-up with cardiology post discharge for preop clearance for possible cholecystectomy in about 6 to 8 weeks  Essential hypertension -BP stable, continue oral Cardizem  GERD Continue PPI  QT prolongation -EKG on 12/15 showed sinus rhythm, QTC 477 -Avoid haldol, keep potassium> 4, magnesium above  Code Status: Full CODE STATUS DVT Prophylaxis:  Lovenox Family Communication: Discussed all imaging results, lab results, explained to the patient     Disposition Plan: Currently very confused, sitter at bedside, unsafe to discharge home, will likely need skilled nursing facility  Time Spent in minutes 35 minutes  Procedures:  Cholecystostomy tube  Consultants:   Interventional radiology GI Surgery  Antimicrobials:   Anti-infectives (From admission, onward)   Start     Dose/Rate Route Frequency Ordered Stop   09/17/19 1000  hydroxychloroquine (PLAQUENIL) tablet 200 mg  Status:  Discontinued     200 mg Oral Daily 09/16/19 2340 09/16/19 2352   09/17/19 0000  piperacillin-tazobactam (ZOSYN) IVPB 3.375 g     3.375 g 12.5 mL/hr over 240 Minutes Intravenous Every 8 hours 09/16/19 2349     09/16/19 1230  piperacillin-tazobactam (ZOSYN) IVPB 3.375 g     3.375 g 100 mL/hr over 30 Minutes Intravenous  Once 09/16/19 1227 09/16/19 1347          Medications  Scheduled Meds: . aspirin  81 mg Oral Daily  . atorvastatin  40 mg Oral Daily  . Chlorhexidine Gluconate Cloth  6 each Topical Daily  . clopidogrel  75 mg Oral Daily  . diltiazem  60 mg Oral Q6H  . docusate sodium  250 mg Oral Daily  . enoxaparin (LOVENOX) injection  40 mg Subcutaneous Daily  . feeding supplement (ENSURE ENLIVE)  237 mL Oral BID BM  . feeding supplement (PRO-STAT SUGAR FREE 64)  30 mL Oral BID  . finasteride  5 mg Oral QPM  . insulin aspart  0-9 Units Subcutaneous Q4H  . mouth rinse  15 mL Mouth Rinse BID  . multivitamin with minerals  1 tablet Oral Daily  . OLANZapine zydis  5 mg Oral BID  . omega-3 acid ethyl esters  1 g Oral Daily  . pantoprazole  40 mg Oral BID  . predniSONE  60 mg Oral QAC breakfast  . sertraline  25 mg Oral Daily  . sodium chloride flush  5 mL Intracatheter Q8H   Continuous Infusions: . sodium chloride Stopped (09/22/19 2342)  . 0.9 % NaCl with KCl 40 mEq / L 75 mL/hr (09/23/19 1002)  . piperacillin-tazobactam (ZOSYN)  IV Stopped (09/23/19 1148)   PRN Meds:.sodium chloride, acetaminophen **OR** acetaminophen, labetalol,  morphine injection, ondansetron **OR** ondansetron (ZOFRAN) IV, sodium chloride flush      Subjective:   Derrick [name] was seen and examined today.  Confused, oriented to self only. Patient denies dizziness, chest pain, shortness of breath, abdominal pain, N/V/D/C. No acute events overnight.    Objective:   Vitals:   09/23/19 0700 09/23/19 0747 09/23/19 0800 09/23/19 1100  BP:  (!) 168/92    Pulse:      Resp:   16   Temp: 98.1 F (36.7 C)   97.9 F (36.6 C)  TempSrc: Oral   Oral  SpO2:   93%   Weight:      Height:        Intake/Output Summary (Last 24 hours) at 09/23/2019 1227  Last data filed at 09/23/2019 0747 Gross per 24 hour  Intake 1086.06 ml  Output 800 ml  Net 286.06 ml     Wt Readings from Last 3 Encounters:  09/23/19 79.4 kg  07/07/19 83.5 kg  07/04/19 74.8 kg     Exam  General: Alert and oriented x 1, NAD  Eyes:  HEENT:  Atraumatic, normocephalic  Cardiovascular: S1 S2 auscultated, no murmurs, RRR  Respiratory: Clear to auscultation bilaterally, no wheezing, rales or rhonchi  Gastrointestinal: Soft, cholecystostomy tube intact  Ext: no pedal edema bilaterally  Neuro: Moving all 4 extremities spontaneously  Musculoskeletal: No digital cyanosis, clubbing  Skin: No rashes  Psych: Confused   Data Reviewed:  I have personally reviewed following labs and imaging studies  Micro Results Recent Results (from the past 240 hour(s))  Blood culture (routine x 2)     Status: Abnormal   Collection Time: 09/16/19 12:15 PM   Specimen: Right Antecubital; Blood  Result Value Ref Range Status   Specimen Description   Final    RIGHT ANTECUBITAL Performed at [facility], [address] [place], [place] [address]    Special Requests   Final    BOTTLES DRAWN AEROBIC AND ANAEROBIC Blood Culture adequate volume Performed at [facility], [address] [place], [place] [address]    Culture  Setup Time   Final    IN BOTH AEROBIC  AND ANAEROBIC BOTTLES GRAM NEGATIVE RODS CRITICAL VALUE NOTED.  VALUE IS CONSISTENT WITH PREVIOUSLY REPORTED AND CALLED VALUE.    Culture (A)  Final    ESCHERICHIA COLI SUSCEPTIBILITIES PERFORMED ON PREVIOUS CULTURE WITHIN THE LAST 5 DAYS. Performed at [facility], [place] [address], [place], [place] [address]    Report Status 09/19/2019 FINAL  Final  Blood culture (routine x 2)     Status: Abnormal   Collection Time: 09/16/19 12:30 PM   Specimen: BLOOD LEFT WRIST  Result Value Ref Range Status   Specimen Description   Final    BLOOD LEFT WRIST Performed at [facility], [address] [place], [place] [address]    Special Requests   Final    BOTTLES DRAWN AEROBIC AND ANAEROBIC Blood Culture adequate volume Performed at [facility], [address] [place], [place] [address]    Culture  Setup Time   Final    IN BOTH AEROBIC AND ANAEROBIC BOTTLES GRAM NEGATIVE RODS CRITICAL RESULT CALLED TO, READ BACK BY AND VERIFIED WITH: B GREEN PHARMD 09/17/19 0428 JDW Performed at [facility], [place] [address], [place], [place] [address]    Culture ESCHERICHIA COLI (A)  Final   Report Status 09/19/2019 FINAL  Final   Organism ID, Bacteria ESCHERICHIA COLI  Final      Susceptibility   Escherichia coli - MIC*    AMPICILLIN >=32 RESISTANT Resistant     CEFAZOLIN >=64 RESISTANT Resistant     CEFEPIME <=1 SENSITIVE Sensitive     CEFTAZIDIME <=1 SENSITIVE Sensitive     CEFTRIAXONE <=1 SENSITIVE Sensitive     CIPROFLOXACIN <=0.25 SENSITIVE Sensitive     GENTAMICIN <=1 SENSITIVE Sensitive     IMIPENEM <=0.25 SENSITIVE Sensitive     TRIMETH/SULFA <=20 SENSITIVE Sensitive     AMPICILLIN/SULBACTAM >=32 RESISTANT Resistant     PIP/TAZO 8 SENSITIVE Sensitive     * ESCHERICHIA COLI  Blood Culture ID Panel (Reflexed)     Status: Abnormal   Collection  Time: 09/16/19 12:30 PM  Result Value Ref Range Status   Enterococcus species NOT DETECTED NOT DETECTED Final    Listeria monocytogenes NOT DETECTED NOT DETECTED Final   Staphylococcus species NOT DETECTED NOT DETECTED Final   Staphylococcus aureus (BCID) NOT DETECTED NOT DETECTED Final   Streptococcus species NOT DETECTED NOT DETECTED Final   Streptococcus agalactiae NOT DETECTED NOT DETECTED Final   Streptococcus pneumoniae NOT DETECTED NOT DETECTED Final   Streptococcus pyogenes NOT DETECTED NOT DETECTED Final   Acinetobacter baumannii NOT DETECTED NOT DETECTED Final   Enterobacteriaceae species DETECTED (A) NOT DETECTED Final    Comment: Enterobacteriaceae represent a large family of gram-negative bacteria, not a single organism. CRITICAL RESULT CALLED TO, READ BACK BY AND VERIFIED WITH: B GREEN PHARMD 09/17/19 0428 JDW    Enterobacter cloacae complex NOT DETECTED NOT DETECTED Final   Escherichia coli DETECTED (A) NOT DETECTED Final    Comment: CRITICAL RESULT CALLED TO, READ BACK BY AND VERIFIED WITH: B GREEN PHARMD 09/17/19 0428 JDW    Klebsiella oxytoca NOT DETECTED NOT DETECTED Final   Klebsiella pneumoniae NOT DETECTED NOT DETECTED Final   Proteus species NOT DETECTED NOT DETECTED Final   Serratia marcescens NOT DETECTED NOT DETECTED Final   Carbapenem resistance NOT DETECTED NOT DETECTED Final   Haemophilus influenzae NOT DETECTED NOT DETECTED Final   Neisseria meningitidis NOT DETECTED NOT DETECTED Final   Pseudomonas aeruginosa NOT DETECTED NOT DETECTED Final   Candida albicans NOT DETECTED NOT DETECTED Final   Candida glabrata NOT DETECTED NOT DETECTED Final   Candida krusei NOT DETECTED NOT DETECTED Final   Candida parapsilosis NOT DETECTED NOT DETECTED Final   Candida tropicalis NOT DETECTED NOT DETECTED Final    Comment: Performed at [facility], [place] [address] [place], [place] [address]  SARS CORONAVIRUS 2 (TAT 6-24 HRS) Nasopharyngeal Nasopharyngeal Swab     Status: None   Collection Time: 09/16/19 12:50 PM   Specimen: Nasopharyngeal Swab  Result Value Ref Range  Status   SARS Coronavirus 2 NEGATIVE NEGATIVE Final    Comment: (NOTE) SARS-CoV-2 target nucleic acids are NOT DETECTED. The SARS-CoV-2 RNA is generally detectable in upper and lower respiratory specimens during the acute phase of infection. Negative results do not preclude SARS-CoV-2 infection, do not rule out co-infections with other pathogens, and should not be used as the sole basis for treatment or other patient management decisions. Negative results must be combined with clinical observations, patient history, and epidemiological information. The expected result is Negative. Fact Sheet for Patients: SugarRoll.be Fact Sheet for Healthcare Providers: https://www.woods-mathews.com/ This test is not yet approved or cleared by the [place] FDA and  has been authorized for detection and/or diagnosis of SARS-CoV-2 by FDA under an Emergency Use Authorization (EUA). This EUA will remain  in effect (meaning this test can be used) for the duration of the COVID-19 declaration under Section 56 4(b)(1) of the Act, 21 U.S.C. section 360bbb-3(b)(1), unless the authorization is terminated or revoked sooner. Performed at [facility], [place] [address], [place], [place] [address]   MRSA PCR Screening     Status: None   Collection Time: 09/16/19 11:34 PM   Specimen: Nasal Mucosa; Nasopharyngeal  Result Value Ref Range Status   MRSA by PCR NEGATIVE NEGATIVE Final    Comment:        The GeneXpert MRSA Assay (FDA approved for NASAL specimens only), is one component of a comprehensive MRSA colonization surveillance program. It is not intended to  diagnose MRSA infection nor to guide or monitor treatment for MRSA infections. Performed at [facility], [place] [address], [place], [place] [address]     Radiology Reports CT HEAD WO CONTRAST  Result Date: 09/17/2019 CLINICAL DATA:  Encephalopathy. EXAM: CT HEAD WITHOUT CONTRAST  TECHNIQUE: Contiguous axial images were obtained from the base of the skull through the vertex without intravenous contrast. COMPARISON:  October 02, 2012. FINDINGS: Brain: Mild diffuse cortical atrophy is noted. Mild chronic ischemic white matter disease is noted. No mass effect or midline shift is noted. Ventricular size is within normal limits. There is no evidence of mass lesion, hemorrhage or acute infarction. Vascular: No hyperdense vessel or unexpected calcification. Skull: Normal. Negative for fracture or focal lesion. Sinuses/Orbits: No acute finding. Other: None. IMPRESSION: Mild diffuse cortical atrophy. Mild chronic ischemic white matter disease. No acute intracranial abnormality seen. Electronically Signed   By: Marijo Conception M.D.   On: 09/17/2019 11:46   CT ABDOMEN PELVIS W CONTRAST  Result Date: 09/16/2019 CLINICAL DATA:  Acute abdominal pain, generalized. EXAM: CT ABDOMEN AND PELVIS WITH CONTRAST TECHNIQUE: Multidetector CT imaging of the abdomen and pelvis was performed using the standard protocol following bolus administration of intravenous contrast. CONTRAST:  115m OMNIPAQUE IOHEXOL 300 MG/ML  SOLN COMPARISON:  10/03/2018 FINDINGS: Lower chest: Small subpleural lymph node in the right lower lobe stable since 2016. Signs of scarring and mild subpleural reticulation, unchanged. No signs of consolidation or evidence of pleural effusion. Hepatobiliary: Liver is unremarkable. There is moderate distension of the gallbladder with signs of gallbladder wall thickening and pericholecystic stranding. Wall thickness difficult to read on CT but is much as 4 mm despite the distended appearance of the gallbladder. There is no intra or extrahepatic biliary ductal distension. Pancreas: Unremarkable. No pancreatic ductal dilatation or surrounding inflammatory changes. Spleen: Normal in size without focal abnormality. Adrenals/Urinary Tract: Signs of right nephrectomy with stable small area of soft tissue  nodularity along the ligated right renal artery measuring approximately 1.5 cm this has been present since 2007. Adrenal glands are normal. Small presumed cysts in the left kidney are unchanged. Stomach/Bowel: No signs of acute gastrointestinal process. Normal appendix. Vascular/Lymphatic: Calcified atherosclerotic changes of the abdominal aorta up to 3 x 2.7 cm in greatest axial dimension. No signs of adenopathy in the upper abdomen or retroperitoneum. No signs of pelvic adenopathy. Vascular disease extends into branch vessels throughout the abdomen and pelvis. Reproductive: Prostatomegaly. No sign of pelvic adenopathy. Other: No signs of free air. No abscess. Musculoskeletal: Spinal degenerative changes with signs of lower lumbar laminectomy. Grade 1 anterolisthesis of L5 on S1. Finding similar to prior study. Also mild anterolisthesis of L4 on L5. IMPRESSION: 1. CT findings of acute cholecystitis. 2. Signs of right nephrectomy with stable small area of soft tissue nodularity along the ligated right renal artery. Unchanged since 2007. 3. Aortic atherosclerosis. 4. Prostatomegaly. Aortic Atherosclerosis (ICD10-I70.0). Electronically Signed   By: GZetta BillsM.D.   On: 09/16/2019 12:39   NM Hepato W/EjeCT Fract  Result Date: 09/17/2019 CLINICAL DATA:  RIGHT upper quadrant pain with nausea and vomiting for 3 days EXAM: NUCLEAR MEDICINE HEPATOBILIARY IMAGING TECHNIQUE: Sequential images of the abdomen were obtained out to 60 minutes following intravenous administration of radiopharmaceutical. RADIOPHARMACEUTICALS:  5.35 mCi Tc-970mCholetec IV Pharmaceutical: Morphine sulfate 3 mg IV COMPARISON:  Ultrasound abdomen 09/16/2019 FINDINGS: Normal tracer extraction from bloodstream indicating normal hepatocellular function. Normal excretion of tracer into biliary tree. Small bowel visualized at 14  minutes. At 1 hour, gallbladder had not visualized. Patient then received morphine and imaging was continued for an  additional 30 minutes. Gallbladder failed to visualize following morphine augmentation. Findings are consistent with cystic duct obstruction and acute cholecystitis. IMPRESSION: Patent CBD. Nonvisualization of the gallbladder despite morphine augmentation consistent with cystic duct obstruction and acute cholecystitis. Electronically Signed   By: Lavonia Dana M.D.   On: 09/17/2019 14:36   US Abdomen Limited  Result Date: 09/16/2019 CLINICAL DATA:  Abdominal pain. CT abdomen and pelvis today demonstrated findings worrisome for cholecystitis. EXAM: ULTRASOUND ABDOMEN LIMITED RIGHT UPPER QUADRANT COMPARISON:  CT abdomen and pelvis earlier today. FINDINGS: Gallbladder: No gallstones or pericholecystic fluid. Sonographer reports negative Murphy's sign. The gallbladder is mildly distended and there is minimal wall thickening at 0.4 cm. Common bile duct: Diameter: 0.8 cm Liver: No focal lesion. Echogenicity is increased. Portal vein is patent on color Doppler imaging with normal direction of blood flow towards the liver. Other: None. IMPRESSION: Negative for gallstones or acute cholecystitis. Fatty infiltration of the liver. Electronically Signed   By: Inge Rise M.D.   On: 09/16/2019 13:59   IR Perc Cholecystostomy  Result Date: 09/18/2019 CLINICAL DATA:  Acute cholecystitis, currently poor operative candidate EXAM: PERCUTANEOUS CHOLECYSTOSTOMY TUBE PLACEMENT WITH ULTRASOUND AND FLUOROSCOPIC GUIDANCE FLUOROSCOPY TIME:  0.5 minutes; 197 uGym2 DAP TECHNIQUE: The procedure, risks (including but not limited to bleeding, infection, organ damage ), benefits, and alternatives were explained to the patient and family. Questions regarding the procedure were encouraged and answered. The family understands and consents to the procedure. Survey ultrasound of the abdomen was performed and an appropriate skin entry site was identified. Skin site was marked, prepped with Betadine, and draped in usual sterile fashion, and  infiltrated locally with 1% lidocaine. Intravenous Fentanyl 160mg and Versed 260mwere administered as conscious sedation during continuous monitoring of the patient's level of consciousness and physiological / cardiorespiratory status by the radiology RN, with a total moderate sedation time of 10 minutes. Under real-time ultrasound guidance, gallbladder was accessed using a transhepatic approach with a 21-gauge needle. Ultrasound image documentation was saved. Bile returned through the hub. Needle was exchanged over a 018 guidewire for transitional dilator which allowed placement of 035 J wire. Over this, a 10.2 French pigtail catheter was advanced and formed centrally in the gallbladder lumen. Small contrast injection confirmed appropriate position. Catheter secured externally with 0 Prolene suture and placed external drain bag. Patient tolerated the procedure well. COMPLICATIONS: COMPLICATIONS none IMPRESSION: 1. Technically successful percutaneous cholecystostomy tube placement with ultrasound and fluoroscopic guidance. Electronically Signed   By: D Lucrezia Europe.D.   On: 09/18/2019 14:56   DG Chest Portable 1 View  Result Date: 09/16/2019 CLINICAL DATA:  Fever, dyspnea. EXAM: PORTABLE CHEST 1 VIEW COMPARISON:  Chest radiograph 12/12/2018 FINDINGS: The patient is significantly rotated to the right, limiting evaluation. Heart size within normal limits. No definite airspace consolidation within the lungs. No evidence of pleural effusion or pneumothorax. No acute bony abnormality. Overlying cardiac monitoring leads. IMPRESSION: Patient significantly rotated to the right, limiting evaluation. No definite airspace consolidation. Electronically Signed   By: KyKellie SimmeringO   On: 09/16/2019 15:16   DG Abd Portable 1V  Result Date: 09/20/2019 CLINICAL DATA:  Biliary drainage tube. EXAM: PORTABLE ABDOMEN - 1 VIEW COMPARISON:  September 18, 2019. FINDINGS: The bowel gas pattern is normal. Percutaneous drainage  catheter is noted in right upper quadrant. No radio-opaque calculi or other significant radiographic abnormality are seen. IMPRESSION:  Percutaneous drainage catheter is noted in right upper quadrant. Whether or not catheter tip is positioned within the gallbladder lumen cannot be determined by this radiograph. Electronically Signed   By: Marijo Conception M.D.   On: 09/20/2019 11:17   ECHOCARDIOGRAM COMPLETE  Result Date: 09/19/2019   ECHOCARDIOGRAM REPORT   Patient Name:   Derrick [name] Date of Exam: 09/19/2019 Medical Rec #:  [id number]   Height:       72.0 in Accession #:    [id number]  Weight:       178.1 lb Date of Birth:  [birth year]  BSA:          2.03 m Patient Age:    43 years    BP:           137/69 mmHg Patient Gender: M           HR:           94 bpm. Exam Location:  Inpatient Procedure: 2D Echo Indications:    Atrial Fibrillation 427.31 / I48.91  History:        Patient has prior history of Echocardiogram examinations, most                 recent 12/12/2018. CAD; Risk Factors:Hypertension. AKI                 Sepsis.  Sonographer:    Vikki Ports Turrentine Referring Phys: [id number] DANIEL V THOMPSON  Sonographer Comments: Suboptimal subcostal window due to patient body habitus. IMPRESSIONS  1. Left ventricular ejection fraction, by visual estimation, is 55 to 60%. The left ventricle has normal function. There is mildly increased left ventricular hypertrophy.  2. Left ventricular diastolic parameters are indeterminate.  3. The left ventricle has no regional wall motion abnormalities.  4. Global right ventricle has normal systolic function.The right ventricular size is normal. No increase in right ventricular wall thickness.  5. Left atrial size was moderately dilated.  6. Right atrial size was normal.  7. The mitral valve is normal in structure. Mild to moderate mitral valve regurgitation.  8. Very eccentric anterior directed MR jet. The degree of MR may be underestimated due to the eccentrity of the jet. The MV to  AV VTI ratio is 0.6 suggesting mild MR.  9. The tricuspid valve is normal in structure. Tricuspid valve regurgitation is trivial. 10. The aortic valve is tricuspid. Aortic valve regurgitation is not visualized. No evidence of aortic valve sclerosis or stenosis. 11. Pulmonic regurgitation is mild. 12. The pulmonic valve was not well visualized. Pulmonic valve regurgitation is mild. 13. The interatrial septum was not well visualized. FINDINGS  Left Ventricle: Left ventricular ejection fraction, by visual estimation, is 55 to 60%. The left ventricle has normal function. The left ventricle has no regional wall motion abnormalities. There is mildly increased left ventricular hypertrophy. Left ventricular diastolic parameters are indeterminate. Right Ventricle: The right ventricular size is normal. No increase in right ventricular wall thickness. Global RV systolic function is has normal systolic function. Left Atrium: Left atrial size was moderately dilated. Right Atrium: Right atrial size was normal in size Pericardium: There is no evidence of pericardial effusion. Mitral Valve: The mitral valve is normal in structure. Mild to moderate mitral valve regurgitation. Very eccentric anterior directed MR jet. The degree of MR may be underestimated due to the eccentrity of the jet. The MV to AV VTI ratio is 0.6 suggesting  mild MR. Tricuspid Valve: The tricuspid valve is normal in structure.  Tricuspid valve regurgitation is trivial. Aortic Valve: The aortic valve is tricuspid. Aortic valve regurgitation is not visualized. The aortic valve is structurally normal, with no evidence of sclerosis or stenosis. Aortic valve mean gradient measures 7.3 mmHg. Aortic valve peak gradient measures 13.7 mmHg. Aortic valve area, by VTI measures 1.77 cm. Pulmonic Valve: The pulmonic valve was not well visualized. Pulmonic valve regurgitation is mild. Pulmonic regurgitation is mild. No evidence of pulmonic stenosis. Aorta: The aortic root is  normal in size and structure. Pulmonary Artery: Indeterminate PASP, inadequate TR jet. Venous: The inferior vena cava was not well visualized. IAS/Shunts: The interatrial septum was not well visualized.  LEFT VENTRICLE PLAX 2D LVIDd:         4.50 cm LVIDs:         3.30 cm LV PW:         1.20 cm LV IVS:        1.20 cm LVOT diam:     2.10 cm LV SV:         48 ml LV SV Index:   23.80 LVOT Area:     3.46 cm  RIGHT VENTRICLE RV S prime:     16.00 cm/s TAPSE (M-mode): 1.6 cm LEFT ATRIUM              Index       RIGHT ATRIUM           Index LA diam:        4.60 cm  2.27 cm/m  RA Area:     13.90 cm LA Vol (A2C):   103.0 ml 50.79 ml/m RA Volume:   27.40 ml  13.51 ml/m LA Vol (A4C):   52.3 ml  25.79 ml/m LA Biplane Vol: 78.9 ml  38.90 ml/m  AORTIC VALVE AV Area (Vmax):    1.72 cm AV Area (Vmean):   1.81 cm AV Area (VTI):     1.77 cm AV Vmax:           185.22 cm/s AV Vmean:          124.794 cm/s AV VTI:            0.352 m AV Peak Grad:      13.7 mmHg AV Mean Grad:      7.3 mmHg LVOT Vmax:         92.03 cm/s LVOT Vmean:        65.200 cm/s LVOT VTI:          0.180 m LVOT/AV VTI ratio: 0.51  AORTA Ao Root diam: 3.40 cm MITRAL VALVE                        TRICUSPID VALVE MV Area (PHT): 3.91 cm             TR Peak grad:   13.4 mmHg MV VTI:        0.21 m               TR Vmax:        183.00 cm/s MV PHT:        56.26 msec MV Decel Time: 194 msec             SHUNTS MV E velocity: 84.27 cm/s 103 cm/s  Systemic VTI:  0.18 m MV A velocity: 79.70 cm/s 70.3 cm/s Systemic Diam: 2.10 cm MV E/A ratio:  1.06       1.5  Carlyle Dolly MD Electronically signed by  Carlyle Dolly MD Signature Date/Time: 09/19/2019/12:26:36 PM    Final     Lab Data:  CBC: Recent Labs  Lab 09/18/19 0217 09/19/19 0158 09/20/19 0214 09/21/19 0214 09/22/19 3462 09/23/19 0500  WBC 16.1* 12.9* 8.6 9.6 11.6* 13.8*  NEUTROABS 14.3* 11.3* 7.6 6.7  --  10.8*  HGB 8.0* 8.6* 8.8* 7.9* 10.2* 10.0*  HCT 26.6* 29.0* 27.7* 24.6* 32.6* 32.6*  MCV 86.4  85.8 83.9 80.9 84.2 84.2  PLT 176 205 208 199 261 194   Basic Metabolic Panel: Recent Labs  Lab 09/19/19 0158 09/20/19 0214 09/21/19 0214 09/21/19 1555 09/22/19 0500 09/22/19 0638 09/23/19 0500  NA 138 138 142  --  139 140 141  K 3.9 3.1* 2.8* 4.1 4.5 3.3* 3.8  CL 106 105 108  --  106 108 110  CO2 '23 24 25  ' --  '23 24 22  ' GLUCOSE 139* 130* 88  --  114* 108* 97  BUN '20 16 11  ' --  '12 11 11  ' CREATININE 0.95 0.83 0.86  --  0.96 0.87 0.94  CALCIUM 8.5* 8.5* 8.5*  --  8.3* 8.5* 8.6*  MG 1.9 2.1  --   --  2.3 2.1 1.9   GFR: Estimated Creatinine Clearance: 64.2 mL/min (by C-G formula based on SCr of 0.94 mg/dL). Liver Function Tests: Recent Labs  Lab 09/20/19 0214 09/21/19 0214 09/22/19 0500 09/22/19 0638 09/23/19 0500  AST 27 30 57* 29 39  ALT 59* 51* 53* 54* 67*  ALKPHOS 76 65 67 64 64  BILITOT 0.9 1.0 2.1* 1.3* 0.8  PROT 5.6* 5.4* 6.2* 6.0* 5.7*  ALBUMIN 2.4* 2.4* 2.8* 2.8* 2.6*   No results for input(s): LIPASE, AMYLASE in the last 168 hours. No results for input(s): AMMONIA in the last 168 hours. Coagulation Profile: Recent Labs  Lab 09/18/19 0905  INR 1.2   Cardiac Enzymes: No results for input(s): CKTOTAL, CKMB, CKMBINDEX, TROPONINI in the last 168 hours. BNP (last 3 results) No results for input(s): PROBNP in the last 8760 hours. HbA1C: No results for input(s): HGBA1C in the last 72 hours. CBG: Recent Labs  Lab 09/22/19 1957 09/22/19 2339 09/23/19 0359 09/23/19 0725 09/23/19 1122  GLUCAP 141* 124* 94 91 145*   Lipid Profile: No results for input(s): CHOL, HDL, LDLCALC, TRIG, CHOLHDL, LDLDIRECT in the last 72 hours. Thyroid Function Tests: No results for input(s): TSH, T4TOTAL, FREET4, T3FREE, THYROIDAB in the last 72 hours. Anemia Panel: No results for input(s): VITAMINB12, FOLATE, FERRITIN, TIBC, IRON, RETICCTPCT in the last 72 hours. Urine analysis:    Component Value Date/Time   COLORURINE YELLOW 09/16/2019 1455   APPEARANCEUR CLEAR  09/16/2019 1455   LABSPEC 1.010 09/16/2019 1455   PHURINE 7.0 09/16/2019 1455   GLUCOSEU NEGATIVE 09/16/2019 1455   HGBUR SMALL (A) 09/16/2019 1455   BILIRUBINUR NEGATIVE 09/16/2019 1455   KETONESUR NEGATIVE 09/16/2019 1455   PROTEINUR 100 (A) 09/16/2019 1455   UROBILINOGEN 1.0 11/27/2013 0947   NITRITE NEGATIVE 09/16/2019 1455   LEUKOCYTESUR NEGATIVE 09/16/2019 1455     Lakena Sparlin M.D. [facility] 09/23/2019, 12:27 PM   Call night coverage person covering after 7pm

## 2019-09-24 LAB — GLUCOSE, CAPILLARY
Glucose-Capillary: 102 mg/dL — ABNORMAL HIGH (ref 70–99)
Glucose-Capillary: 117 mg/dL — ABNORMAL HIGH (ref 70–99)
Glucose-Capillary: 121 mg/dL — ABNORMAL HIGH (ref 70–99)
Glucose-Capillary: 127 mg/dL — ABNORMAL HIGH (ref 70–99)
Glucose-Capillary: 167 mg/dL — ABNORMAL HIGH (ref 70–99)
Glucose-Capillary: 95 mg/dL (ref 70–99)

## 2019-09-24 MED ORDER — PREDNISONE 20 MG PO TABS
20.0000 mg | ORAL_TABLET | Freq: Every day | ORAL | Status: AC
  Administered 2019-09-25: 20 mg via ORAL
  Filled 2019-09-24: qty 1

## 2019-09-24 MED ORDER — QUETIAPINE FUMARATE 25 MG PO TABS
25.0000 mg | ORAL_TABLET | Freq: Every day | ORAL | Status: DC
  Administered 2019-09-24: 25 mg via ORAL
  Filled 2019-09-24: qty 1

## 2019-09-24 NOTE — Care Management Important Message (Signed)
Important Message  Patient Details IM Letter given to Dessa Phi RN Case Manager to present to the Patient Name: Derrick [name] MRN: [id number] Date of Birth: [birth year]   Medicare Important Message Given:  Yes     Kerin Salen 09/24/2019, 11:23 AM

## 2019-09-24 NOTE — NC FL2 (Signed)
[place] LEVEL OF CARE SCREENING TOOL     IDENTIFICATION  Patient Name: Derrick [name] Birthdate: 27-Aug-1932 Sex: male Admission Date (Current Location): 09/16/2019  [facility] and [place] Number:  Herbalist and Address:  [facility],  [place], [address]      Provider Number: [id number]  Attending Physician Name and Address:  Mendel Corning, MD  Relative Name and Phone Number:  Anthuan Tenny son [phone]    Current Level of Care: Hospital Recommended Level of Care: [place] Prior Approval Number:    Date Approved/Denied:   PASRR Number: [id number]  Discharge Plan: SNF    Current Diagnoses: Patient Active Problem List   Diagnosis Date Noted  . Atrial fibrillation with RVR ([place]) 09/19/2019  . Acute cholecystitis 09/18/2019  . E coli bacteremia 09/17/2019  . Sepsis ([place]) 09/16/2019  . Transaminitis 09/16/2019  . Nausea and vomiting 09/16/2019  . Rheumatoid arthritis ([place]) 09/16/2019  . Arm pain 07/07/2019  . A-fib ([place]) 12/12/2018  . Acute blood loss anemia 11/28/2018  . Tachycardia 11/27/2018  . Gross hematuria 11/27/2018  . Acute metabolic encephalopathy [id number]  . Spinal stenosis, lumbar region with neurogenic claudication 11/26/2018  . Ulcer of right foot limited to breakdown of skin ([place])   . PAD (peripheral artery disease) ([place]) 09/23/2017  . Syncope and collapse 09/19/2017  . Osteomyelitis ([place])   . Malaise 09/09/2017  . Anorexia 09/09/2017  . Hypotension 09/09/2017  . Volume depletion 09/09/2017  . AKI (acute kidney injury) ([place]) 09/09/2017  . Hyponatremia 06/12/2017  . COPD (chronic obstructive pulmonary disease) ([place])   . Lacunar infarction ([place])   . Gangrene of toe of right foot ([place])   . Pre-syncope 11/21/2016  . Dyspnea on exertion 03/03/2016  . H/O unilateral Rt nephrectomy 03/03/2016  . Asymmetrical right sensorineural hearing loss 02/24/2016  . Bilateral hearing loss  02/24/2016  . Bilateral impacted cerumen 02/24/2016  . Arrhythmia 11/09/2015  . CAD (coronary artery disease) 11/09/2015  . Abnormal ECG 10/22/2013  . Dyspnea 10/22/2013  . Chest pain 11/18/2011  . Unstable angina ([place]) 11/18/2011  . Thrombocytopenia ([place]) 11/18/2011  . Anemia 11/18/2011  . Stage 3 chronic kidney disease 11/18/2011  . HTN (hypertension)   . High cholesterol   . GERD (gastroesophageal reflux disease)   . BPH (benign prostatic hyperplasia)     Orientation RESPIRATION BLADDER Height & Weight     Self  Normal Incontinent Weight: 79.4 kg Height:  6' (182.9 cm)  BEHAVIORAL SYMPTOMS/MOOD NEUROLOGICAL BOWEL NUTRITION STATUS      Incontinent Diet(regular by d/c)  AMBULATORY STATUS COMMUNICATION OF NEEDS Skin   Limited Assist Verbally Surgical wounds(biliary drain)                       Personal Care Assistance Level of Assistance  Bathing, Feeding, Dressing Bathing Assistance: Limited assistance Feeding assistance: Limited assistance Dressing Assistance: Limited assistance     Functional Limitations Info  Sight, Hearing, Speech Sight Info: Impaired(eyeglasses) Hearing Info: Adequate Speech Info: Adequate    SPECIAL CARE FACTORS FREQUENCY  PT (By licensed PT), OT (By licensed OT)     PT Frequency: 5x week OT Frequency: 5x week            Contractures Contractures Info: Not present    Additional Factors Info  Code Status, Allergies Code Status Info: Full code Allergies Info: Effexor Venlafaxine, Lisinopril, Paxil Paroxetine, Viagra Sildenafil, Wellbutrin Bupropion  Current Medications (09/24/2019):  This is the current hospital active medication list Current Facility-Administered Medications  Medication Dose Route Frequency Provider Last Rate Last Admin  . 0.9 %  sodium chloride infusion   Intravenous PRN Virgel Manifold, MD   Stopped at 09/22/19 2342  . 0.9 % NaCl with KCl 40 mEq / L  infusion   Intravenous Continuous Eugenie Filler, MD 75 mL/hr at 09/23/19 2247 75 mL/hr at 09/23/19 2247  . acetaminophen (TYLENOL) tablet 650 mg  650 mg Oral Q6H PRN Etta Quill, DO       Or  . acetaminophen (TYLENOL) suppository 650 mg  650 mg Rectal Q6H PRN Etta Quill, DO   650 mg at 09/18/19 2200  . aspirin chewable tablet 81 mg  81 mg Oral Daily Eugenie Filler, MD   81 mg at 09/24/19 1257  . atorvastatin (LIPITOR) tablet 40 mg  40 mg Oral Daily Eugenie Filler, MD   40 mg at 09/24/19 1257  . Chlorhexidine Gluconate Cloth 2 % PADS 6 each  6 each Topical Daily Eugenie Filler, MD   6 each at 09/23/19 [phone]  . clopidogrel (PLAVIX) tablet 75 mg  75 mg Oral Daily Eugenie Filler, MD   75 mg at 09/24/19 1258  . diltiazem (CARDIZEM) tablet 60 mg  60 mg Oral Q6H Eugenie Filler, MD   60 mg at 09/24/19 1256  . docusate sodium (COLACE) capsule 250 mg  250 mg Oral Daily Eugenie Filler, MD   250 mg at 09/24/19 1259  . enoxaparin (LOVENOX) injection 40 mg  40 mg Subcutaneous Daily Allred, Darrell K, PA-C   40 mg at 09/24/19 1300  . feeding supplement (ENSURE ENLIVE) (ENSURE ENLIVE) liquid 237 mL  237 mL Oral BID BM Rai, Ripudeep K, MD   237 mL at 09/23/19 1545  . feeding supplement (PRO-STAT SUGAR FREE 64) liquid 30 mL  30 mL Oral BID Rai, Ripudeep K, MD   30 mL at 09/24/19 1301  . finasteride (PROSCAR) tablet 5 mg  5 mg Oral QPM Eugenie Filler, MD   5 mg at 09/23/19 [id number]  . insulin aspart (novoLOG) injection 0-9 Units  0-9 Units Subcutaneous Q4H Etta Quill, DO   1 Units at 09/23/19 1548  . labetalol (NORMODYNE) injection 10 mg  10 mg Intravenous Q4H PRN Rai, Ripudeep K, MD   10 mg at 09/22/19 1114  . MEDLINE mouth rinse  15 mL Mouth Rinse BID Eugenie Filler, MD   15 mL at 09/23/19 2210  . morphine 2 MG/ML injection 1 mg  1 mg Intravenous Q4H PRN Eugenie Filler, MD   1 mg at 09/23/19 [id number]  . multivitamin with minerals tablet 1 tablet  1 tablet Oral Daily Rai, Ripudeep K, MD   1 tablet at 09/24/19  1257  . OLANZapine zydis (ZYPREXA) disintegrating tablet 5 mg  5 mg Oral BID Eugenie Filler, MD   5 mg at 09/24/19 1258  . omega-3 acid ethyl esters (LOVAZA) capsule 1 g  1 g Oral Daily Eugenie Filler, MD   1 g at 09/24/19 1257  . ondansetron (ZOFRAN) tablet 4 mg  4 mg Oral Q6H PRN Etta Quill, DO       Or  . ondansetron [facility]) injection 4 mg  4 mg Intravenous Q6H PRN Etta Quill, DO      . pantoprazole (PROTONIX) EC tablet 40 mg  40 mg Oral BID Irine Seal  V, MD   40 mg at 09/24/19 1257  . piperacillin-tazobactam (ZOSYN) IVPB 3.375 g  3.375 g Intravenous Q8H Dorrene German, RPH 12.5 mL/hr at 09/24/19 0958 3.375 g at 09/24/19 0958  . predniSONE (DELTASONE) tablet 40 mg  40 mg Oral Q breakfast Rai, Ripudeep K, MD   40 mg at 09/24/19 1257  . sertraline (ZOLOFT) tablet 25 mg  25 mg Oral Daily Eugenie Filler, MD   25 mg at 09/24/19 1258  . sodium chloride flush (NS) 0.9 % injection 10-40 mL  10-40 mL Intracatheter PRN Clarene Essex, MD      . sodium chloride flush (NS) 0.9 % injection 5 mL  5 mL Intracatheter Q8H Arne Cleveland, MD   5 mL at 09/23/19 1546     Discharge Medications: Please see discharge summary for a list of discharge medications.  Relevant Imaging Results:  Relevant Lab Results:   Additional Information [phone]  Dessa Phi, RN

## 2019-09-24 NOTE — Consult Note (Signed)
   [facility] CM Inpatient Consult   09/24/2019  ZEE VILL [birth year] [id number]   Patient screened for potential [facility] Care Management services due to unplanned readmission risk score of 34%, extreme.   Per chart review, current recommendation is for SNF. Will continue to follow for progression and disposition plans and attempt to engage patient if appropriate.  Of note, [facility] Care Management services does not replace or interfere with any services that are arranged by inpatient case management or social work.  Netta Cedars, MSN, [facility] Liaison Nurse Mobile Phone [phone]  Toll free office [phone]

## 2019-09-24 NOTE — Progress Notes (Addendum)
Referring Physician(s): Saverio Danker  Supervising Physician: Markus Daft  Patient Status:  [facility] - In-pt  Chief Complaint: None  Subjective:  History of acute cholecystitis s/p percutaneous cholecystostomy tube placement in IR 09/18/2019 by Dr. Vernard Gambles. Patient awake and alert laying in bed. He answers yes/no questions appropriately. No complaints. Accompanied by wife at bedside. Cholecystostomy tube site c/d/i.   Allergies: Effexor [venlafaxine], Lisinopril, Paxil [paroxetine], Viagra [sildenafil], and Wellbutrin [bupropion]  Medications: Prior to Admission medications   Medication Sig Start Date End Date Taking? Authorizing Provider  acetaminophen (TYLENOL) 500 MG tablet Take 1,000 mg by mouth every 6 (six) hours as needed for moderate pain or headache.   Yes [provider]  amLODipine (NORVASC) 5 MG tablet Take 1 tablet (5 mg total) by mouth daily. 08/25/19  Yes Croitoru, Mihai, MD  aspirin 81 MG chewable tablet Chew 81 mg by mouth daily.   Yes [provider]  atorvastatin (LIPITOR) 80 MG tablet Take 40 mg by mouth daily.   Yes [provider]  clopidogrel (PLAVIX) 75 MG tablet Take 75 mg by mouth daily.   Yes [provider]  docusate sodium (COLACE) 250 MG capsule Take 250 mg by mouth daily.   Yes [provider]  finasteride (PROSCAR) 5 MG tablet Take 5 mg by mouth every evening.    Yes [provider]  hydroxychloroquine (PLAQUENIL) 200 MG tablet Take 200 mg by mouth daily.   Yes [provider]  Multiple Vitamin (MULTIVITAMIN WITH MINERALS) TABS tablet Take 1 tablet by mouth every evening.    Yes [provider]  nitroGLYCERIN (NITRODUR - DOSED IN MG/24 HR) 0.1 mg/hr patch Place 0.1 mg onto the skin daily. 08/14/19  Yes [provider]  nitroGLYCERIN (NITROSTAT) 0.3 MG SL tablet Place 0.3 mg under the tongue every 5 (five) minutes as needed for chest pain.   Yes [provider]   Omega-3 Fatty Acids (FISH OIL) 1000 MG CAPS Take 1,000 mg by mouth daily.   Yes [provider]  pantoprazole (PROTONIX) 40 MG tablet Take 40 mg by mouth 2 (two) times daily. 08/03/19  Yes [provider]  polyethylene glycol (MIRALAX / GLYCOLAX) 17 g packet Take 17 g by mouth daily as needed for mild constipation.   Yes [provider]  predniSONE (DELTASONE) 5 MG tablet Take 5 mg by mouth daily with breakfast.   Yes [provider]  sertraline (ZOLOFT) 25 MG tablet Take 25 mg by mouth daily.   Yes [provider]  traMADol (ULTRAM) 50 MG tablet Take 50-100 mg by mouth every 6 (six) hours as needed for moderate pain.   Yes [provider]  traZODone (DESYREL) 50 MG tablet Take 50-100 mg by mouth at bedtime as needed for sleep. 08/24/19  Yes [provider]  HYDROcodone-acetaminophen (NORCO/VICODIN) 5-325 MG tablet Take 1 tablet by mouth every 6 (six) hours as needed for moderate pain ((score 4 to 6)). Patient not taking: Reported on 09/17/2019 12/13/18   Geradine Girt, DO     Vital Signs: BP 127/71 (BP Location: Left Leg)   Pulse 78   Temp 98.1 F (36.7 C) (Oral)   Resp 11   Ht 6' (1.829 m)   Wt 175 lb (79.4 kg)   SpO2 97%   BMI 23.73 kg/m   Physical Exam Vitals and nursing note reviewed.  Constitutional:      General: He is not in acute distress. Pulmonary:     Effort: Pulmonary  effort is normal. No respiratory distress.  Abdominal:     Comments: Abdominal binder in place. Cholecystostomy tube site with minimal clear yellow drainage on bandage, no tenderness, erythema, or active drainage/bleeding noted; approximately 50 cc dark brown fluid in gravity bag.  Skin:    General: Skin is warm and dry.  Neurological:     Mental Status: He is alert.     Imaging: No results found.  Labs:  CBC: Recent Labs    09/20/19 0214 09/21/19 0214 09/22/19 0638 09/23/19 0500  WBC 8.6 9.6 11.6* 13.8*  HGB 8.8* 7.9*  10.2* 10.0*  HCT 27.7* 24.6* 32.6* 32.6*  PLT 208 199 261 290    COAGS: Recent Labs    11/24/18 1122 07/04/19 1223 09/18/19 0905  INR 0.96 1.1 1.2  APTT  --  26  --     BMP: Recent Labs    09/21/19 0214 09/21/19 1555 09/22/19 0500 09/22/19 0638 09/23/19 0500  NA 142  --  139 140 141  K 2.8* 4.1 4.5 3.3* 3.8  CL 108  --  106 108 110  CO2 25  --  23 24 22   GLUCOSE 88  --  114* 108* 97  BUN 11  --  12 11 11   CALCIUM 8.5*  --  8.3* 8.5* 8.6*  CREATININE 0.86  --  0.96 0.87 0.94  GFRNONAA >60  --  >60 >60 >60  GFRAA >60  --  >60 >60 >60    LIVER FUNCTION TESTS: Recent Labs    09/21/19 0214 09/22/19 0500 09/22/19 0638 09/23/19 0500  BILITOT 1.0 2.1* 1.3* 0.8  AST 30 57* 29 39  ALT 51* 53* 54* 67*  ALKPHOS 65 67 64 64  PROT 5.4* 6.2* 6.0* 5.7*  ALBUMIN 2.4* 2.8* 2.8* 2.6*    Assessment and Plan:  History of acute cholecystitis s/p percutaneous cholecystostomy tube placement in IR 09/18/2019 by Dr. Vernard Gambles. Cholecystostomy tube stable with approximately 50 cc of dark brown bile in gravity bag (additional 125 cc output in past 24 hours per chart). Continue current drain management- continue with Qshift flushes/monitor of output. Further plans per TRH/CCS/GI- appreciate and agree with management. IR to follow.   Electronically Signed: Earley Abide, PA-C 09/24/2019, 1:05 PM   I spent a total of 25 Minutes at the the patient's bedside AND on the patient's hospital floor or unit, greater than 50% of which was counseling/coordinating care for acute cholecystitis s/p cholecystostomy tube placement.

## 2019-09-24 NOTE — TOC Initial Note (Signed)
Transition of Care [facility]) - Initial/Assessment Note    Patient Details  Name: Derrick [name] MRN: [id number] Date of Birth: Apr 02, 1932  [facility] CM/SW Contact:    Dessa Phi, RN Phone Number: 09/24/2019, 1:22 PM  Clinical Narrative: Damaris Schooner to son [name] agreed to fax out to SNF-await bed offers.                  Expected Discharge Plan: Skilled Nursing Facility Barriers to Discharge: Continued Medical Work up   Patient Goals and CMS Choice Patient states their goals for this hospitalization and ongoing recovery are:: go to rehab CMS Medicare.gov Compare Post Acute Care list provided to:: Patient Represenative (must comment) Choice offered to / list presented to : Adult Children  Expected Discharge Plan and Services Expected Discharge Plan: [place]   Discharge Planning Services: CM Consult Post Acute Care Choice: [place] Living arrangements for the past 2 months: Single Family Home                                      Prior Living Arrangements/Services Living arrangements for the past 2 months: Single Family Home Lives with:: Spouse   Do you feel safe going back to the place where you live?: Yes               Activities of Daily Living      Permission Sought/Granted Permission sought to share information with : Case Manager Permission granted to share information with : Yes, Verbal Permission Granted              Emotional Assessment              Admission diagnosis:  Delirium [R41.0] Abdominal pain, unspecified abdominal location [R10.9] Fever, unspecified fever cause [R50.9] Patient Active Problem List   Diagnosis Date Noted  . Atrial fibrillation with RVR ([place]) 09/19/2019  . Acute cholecystitis 09/18/2019  . E coli bacteremia 09/17/2019  . Sepsis ([place]) 09/16/2019  . Transaminitis 09/16/2019  . Nausea and vomiting 09/16/2019  . Rheumatoid arthritis ([place]) 09/16/2019  . Arm pain  07/07/2019  . A-fib ([place]) 12/12/2018  . Acute blood loss anemia 11/28/2018  . Tachycardia 11/27/2018  . Gross hematuria 11/27/2018  . Acute metabolic encephalopathy [id number]  . Spinal stenosis, lumbar region with neurogenic claudication 11/26/2018  . Ulcer of right foot limited to breakdown of skin ([place])   . PAD (peripheral artery disease) ([place]) 09/23/2017  . Syncope and collapse 09/19/2017  . Osteomyelitis ([place])   . Malaise 09/09/2017  . Anorexia 09/09/2017  . Hypotension 09/09/2017  . Volume depletion 09/09/2017  . AKI (acute kidney injury) ([place]) 09/09/2017  . Hyponatremia 06/12/2017  . COPD (chronic obstructive pulmonary disease) ([place])   . Lacunar infarction ([place])   . Gangrene of toe of right foot ([place])   . Pre-syncope 11/21/2016  . Dyspnea on exertion 03/03/2016  . H/O unilateral Rt nephrectomy 03/03/2016  . Asymmetrical right sensorineural hearing loss 02/24/2016  . Bilateral hearing loss 02/24/2016  . Bilateral impacted cerumen 02/24/2016  . Arrhythmia 11/09/2015  . CAD (coronary artery disease) 11/09/2015  . Abnormal ECG 10/22/2013  . Dyspnea 10/22/2013  . Chest pain 11/18/2011  . Unstable angina ([place]) 11/18/2011  . Thrombocytopenia ([place]) 11/18/2011  . Anemia 11/18/2011  . Stage 3 chronic kidney disease 11/18/2011  . HTN (hypertension)   . High cholesterol   . GERD (gastroesophageal  reflux disease)   . BPH (benign prostatic hyperplasia)    PCP:  [name], MD Pharmacy:   [facility] [address] [place] [address] Phone: [phone] Fax: [phone]     Social Determinants of Health (SDOH) Interventions    Readmission Risk Interventions Readmission Risk Prevention Plan 09/22/2019  Transportation Screening Complete  Medication Review ([place]) Complete  PCP or Specialist appointment within 3-5 days of discharge Complete  HRI or Home Care Consult Complete  SW Recovery  Care/Counseling Consult Complete  Palliative Care Screening Not [place] Complete  Some recent data might be hidden

## 2019-09-24 NOTE — Progress Notes (Signed)
[facility]                                                                              Patient Demographics  Derrick [name], is a 85 y.o. male, DOB - 06-Jun-1932, ZSM:[id number]  Admit date - 09/16/2019   Admitting Physician Eugenie Filler, MD  Outpatient Primary MD for the patient is [name], MD  Outpatient specialists:   LOS - 8  days   Medical records reviewed and are as summarized below:    Chief Complaint  Patient presents with  . Abdominal Pain       Brief summary   Derrick [name] a 86 y.o.malewith medical history significant ofRA on prednisone and hydroxychloroquine, CAD and PAD, HTN. Patient presented to [facility] ED with c/o periumbilical to lower abd pain, onset 3 days ago. Associated with N/V. No change in BM. No urinary complaints. Went to PCP today and referred to ED. In ED, patient was found to be septic with WBC 17 K, temp 101.6 F, heart rate 134.  Acute transaminitis, COVID-19 negative.  CT abdomen pelvis worrisome for acute cholecystitis. Patient was admitted, general surgery was consulted  Assessment & Plan    Sepsis secondary to acute cholecystitis and E. coli bacteremia - Patient met sepsis criteria at the time of admission with fevers, leukocytosis, tachycardia, source likely due to acute cholecystitis -CT abdomen pelvis showed acute cholecystitis, patient also had transaminitis, rapidly progressive encephalopathy on admission -Blood cultures positive for E. coli.  Patient was placed on n.p.o. status, IV fluids and empiric IV antibiotics. General surgery was consulted -Status post cholecystostomy tube placed by IR on 12/11 with bilious drainage.   -Antibiotics narrowed to IV Zosyn per sensitivities -Transition from IV Solu-Cortef to oral prednisone (Patient noted to be on Plaquenil and chronic steroids for RA and steroid started due to worsening encephalopathy).  -GI was consulted on 12/11, recommended to continue current  management.  Defer to general surgery and IR, if patient does not improve with PERC drain or if LFTs continues to trend up, patient will need MRCP. -Seen by IR, stable, continue current drain management -Tolerating full liquid diet  E. coli bacteremia -Likely due to acute cholecystitis -Continue IV Zosyn, will likely need 10 to 14 days antibiotics  Rheumatoid arthritis - Plaquenil and prednisone were placed on hold and patient was started on IV stress dose steroids - IV Solu-Cortef as now being transitioned to oral prednisone and slowly taper back to home dose (5 mg) - Restart Plaquenil outpatient once acute infection has resolved - will taper prednisone to 41m. Per son, he was on 564mbut for the last month PTA he wasn't taking prednisone, had not refilled.   A. fib with RVR -Patient was noted to be in atrial fibrillation with RVR post cholecystostomy tube on the evening of 12/11 and had to be placed on Cardizem drip -Heart rate now controlled, Cardizem drip has been off, currently tolerating oral Cardizem 60 mg every 6 hours -Continue chronic Plavix for CAD/PAD but not on chronic blood thinners   Acute metabolic encephalopathy, likely has underlying dementia at baseline -Still very confused, had a rough  night, was placed on restraints, sitter at the bedside -Per son, has baseline mild dementia, likely worsened now with sepsis, delirium and sundowning  -Head CT negative for acute abnormalities, showed mild chronic atrophy and chronic ischemic changes -Will DC olanzapine, placed on Seroquel 25 mg at bedtime, continue sertraline in the morning, cut down prednisone -If no significant improvement in next 24 to 48 hours, will consult psychiatry  Coronary disease/PAD -Currently no chest pain or shortness of breath, stable -Continue home regimen of aspirin, Lipitor, fish oil.  Continue Plavix -Outpatient follow-up with cardiology post discharge for preop clearance for possible  cholecystectomy in about 6 to 8 weeks  Essential hypertension -BP stable, continue oral Cardizem  GERD Cont PPI  QT prolongation -EKG on 12/15 showed sinus rhythm, QTC 477 -Avoid haldol, keep potassium> 4, magnesium above  Code Status: Full CODE STATUS DVT Prophylaxis:  Lovenox Family Communication: Discussed all imaging results, lab results, explained to the patient's son on the phone   Disposition Plan: Currently very confused, sitter at bedside, unsafe to discharge home, will likely need skilled nursing facility once mental status improved  Time Spent in minutes 35 minutes  Procedures:  Cholecystostomy tube  Consultants:   Interventional radiology GI Surgery  Antimicrobials:   Anti-infectives (From admission, onward)   Start     Dose/Rate Route Frequency Ordered Stop   09/17/19 1000  hydroxychloroquine (PLAQUENIL) tablet 200 mg  Status:  Discontinued     200 mg Oral Daily 09/16/19 2340 09/16/19 2352   09/17/19 0000  piperacillin-tazobactam (ZOSYN) IVPB 3.375 g     3.375 g 12.5 mL/hr over 240 Minutes Intravenous Every 8 hours 09/16/19 2349     09/16/19 1230  piperacillin-tazobactam (ZOSYN) IVPB 3.375 g     3.375 g 100 mL/hr over 30 Minutes Intravenous  Once 09/16/19 1227 09/16/19 1347         Medications  Scheduled Meds: . aspirin  81 mg Oral Daily  . atorvastatin  40 mg Oral Daily  . Chlorhexidine Gluconate Cloth  6 each Topical Daily  . clopidogrel  75 mg Oral Daily  . diltiazem  60 mg Oral Q6H  . docusate sodium  250 mg Oral Daily  . enoxaparin (LOVENOX) injection  40 mg Subcutaneous Daily  . feeding supplement (ENSURE ENLIVE)  237 mL Oral BID BM  . feeding supplement (PRO-STAT SUGAR FREE 64)  30 mL Oral BID  . finasteride  5 mg Oral QPM  . insulin aspart  0-9 Units Subcutaneous Q4H  . mouth rinse  15 mL Mouth Rinse BID  . multivitamin with minerals  1 tablet Oral Daily  . OLANZapine zydis  5 mg Oral BID  . omega-3 acid ethyl esters  1 g Oral  Daily  . pantoprazole  40 mg Oral BID  . predniSONE  40 mg Oral Q breakfast  . sertraline  25 mg Oral Daily  . sodium chloride flush  5 mL Intracatheter Q8H   Continuous Infusions: . sodium chloride Stopped (09/22/19 2342)  . 0.9 % NaCl with KCl 40 mEq / L 75 mL/hr (09/24/19 1327)  . piperacillin-tazobactam (ZOSYN)  IV 3.375 g (09/24/19 0958)   PRN Meds:.sodium chloride, acetaminophen **OR** acetaminophen, labetalol, morphine injection, ondansetron **OR** ondansetron (ZOFRAN) IV, sodium chloride flush      Subjective:   Amoni Morales was seen and examined today.  Very confused, somnolent at the time of my encounter, had a rough night.  Oriented to himself only.  Difficult to obtain review of system from  the patient.  Sitter at the bedside.  No active nausea or vomiting or any respiratory distress.  No fevers  Objective:   Vitals:   09/23/19 2003 09/24/19 0001 09/24/19 0353 09/24/19 1242  BP: 105/66 (!) 160/139 118/87 127/71  Pulse: 85 86 (!) 106 78  Resp: '18 18 18 11  ' Temp: 98.8 F (37.1 C) 98 F (36.7 C) 98.4 F (36.9 C) 98.1 F (36.7 C)  TempSrc: Oral Oral Oral Oral  SpO2: 96% 97% 95% 97%  Weight:      Height:        Intake/Output Summary (Last 24 hours) at 09/24/2019 1354 Last data filed at 09/24/2019 0817 Gross per 24 hour  Intake 2794.3 ml  Output 2240 ml  Net 554.3 ml     Wt Readings from Last 3 Encounters:  09/23/19 79.4 kg  07/07/19 83.5 kg  07/04/19 74.8 kg   Physical Exam  General: Sleepy but arousable, oriented x1  Eyes:   HEENT:  Atraumatic, normocephalic  Cardiovascular: S1 S2 clear, no murmurs, RRR. No pedal edema b/l  Respiratory: CTAB, no wheezing, rales or rhonchi  Gastrointestinal: Soft, NBS, cholecystotomy tube  Ext: no pedal edema bilaterally  Neuro: no new deficits  Musculoskeletal: No cyanosis, clubbing  Skin: No rashes  Psych: Confused    Data Reviewed:  I have personally reviewed following labs and imaging  studies  Micro Results Recent Results (from the past 240 hour(s))  Blood culture (routine x 2)     Status: Abnormal   Collection Time: 09/16/19 12:15 PM   Specimen: Right Antecubital; Blood  Result Value Ref Range Status   Specimen Description   Final    RIGHT ANTECUBITAL Performed at [facility], [address] [place], [place] [address]    Special Requests   Final    BOTTLES DRAWN AEROBIC AND ANAEROBIC Blood Culture adequate volume Performed at [facility], [address] [place], [place] [address]    Culture  Setup Time   Final    IN BOTH AEROBIC AND ANAEROBIC BOTTLES GRAM NEGATIVE RODS CRITICAL VALUE NOTED.  VALUE IS CONSISTENT WITH PREVIOUSLY REPORTED AND CALLED VALUE.    Culture (A)  Final    ESCHERICHIA COLI SUSCEPTIBILITIES PERFORMED ON PREVIOUS CULTURE WITHIN THE LAST 5 DAYS. Performed at [facility], [place] [address] [place], [place] [address]    Report Status 09/19/2019 FINAL  Final  Blood culture (routine x 2)     Status: Abnormal   Collection Time: 09/16/19 12:30 PM   Specimen: BLOOD LEFT WRIST  Result Value Ref Range Status   Specimen Description   Final    BLOOD LEFT WRIST Performed at [facility], [address] [place], [place] [address]    Special Requests   Final    BOTTLES DRAWN AEROBIC AND ANAEROBIC Blood Culture adequate volume Performed at [facility], [address] [place], [place] [address]    Culture  Setup Time   Final    IN BOTH AEROBIC AND ANAEROBIC BOTTLES GRAM NEGATIVE RODS CRITICAL RESULT CALLED TO, READ BACK BY AND VERIFIED WITH: B GREEN PHARMD 09/17/19 0428 JDW Performed at [facility], [place] [address], [place], [place] [address]    Culture ESCHERICHIA COLI (A)  Final   Report Status 09/19/2019 FINAL  Final   Organism ID, Bacteria ESCHERICHIA COLI  Final      Susceptibility   Escherichia coli - MIC*  AMPICILLIN >=32 RESISTANT Resistant     CEFAZOLIN >=64 RESISTANT  Resistant     CEFEPIME <=1 SENSITIVE Sensitive     CEFTAZIDIME <=1 SENSITIVE Sensitive     CEFTRIAXONE <=1 SENSITIVE Sensitive     CIPROFLOXACIN <=0.25 SENSITIVE Sensitive     GENTAMICIN <=1 SENSITIVE Sensitive     IMIPENEM <=0.25 SENSITIVE Sensitive     TRIMETH/SULFA <=20 SENSITIVE Sensitive     AMPICILLIN/SULBACTAM >=32 RESISTANT Resistant     PIP/TAZO 8 SENSITIVE Sensitive     * ESCHERICHIA COLI  Blood Culture ID Panel (Reflexed)     Status: Abnormal   Collection Time: 09/16/19 12:30 PM  Result Value Ref Range Status   Enterococcus species NOT DETECTED NOT DETECTED Final   Listeria monocytogenes NOT DETECTED NOT DETECTED Final   Staphylococcus species NOT DETECTED NOT DETECTED Final   Staphylococcus aureus (BCID) NOT DETECTED NOT DETECTED Final   Streptococcus species NOT DETECTED NOT DETECTED Final   Streptococcus agalactiae NOT DETECTED NOT DETECTED Final   Streptococcus pneumoniae NOT DETECTED NOT DETECTED Final   Streptococcus pyogenes NOT DETECTED NOT DETECTED Final   Acinetobacter baumannii NOT DETECTED NOT DETECTED Final   Enterobacteriaceae species DETECTED (A) NOT DETECTED Final    Comment: Enterobacteriaceae represent a large family of gram-negative bacteria, not a single organism. CRITICAL RESULT CALLED TO, READ BACK BY AND VERIFIED WITH: B GREEN PHARMD 09/17/19 0428 JDW    Enterobacter cloacae complex NOT DETECTED NOT DETECTED Final   Escherichia coli DETECTED (A) NOT DETECTED Final    Comment: CRITICAL RESULT CALLED TO, READ BACK BY AND VERIFIED WITH: B GREEN PHARMD 09/17/19 0428 JDW    Klebsiella oxytoca NOT DETECTED NOT DETECTED Final   Klebsiella pneumoniae NOT DETECTED NOT DETECTED Final   Proteus species NOT DETECTED NOT DETECTED Final   Serratia marcescens NOT DETECTED NOT DETECTED Final   Carbapenem resistance NOT DETECTED NOT DETECTED Final   Haemophilus influenzae NOT DETECTED NOT DETECTED Final   Neisseria meningitidis NOT DETECTED NOT DETECTED Final    Pseudomonas aeruginosa NOT DETECTED NOT DETECTED Final   Candida albicans NOT DETECTED NOT DETECTED Final   Candida glabrata NOT DETECTED NOT DETECTED Final   Candida krusei NOT DETECTED NOT DETECTED Final   Candida parapsilosis NOT DETECTED NOT DETECTED Final   Candida tropicalis NOT DETECTED NOT DETECTED Final    Comment: Performed at [facility], [place] [address], [place], [place] [address]  SARS CORONAVIRUS 2 (TAT 6-24 HRS) Nasopharyngeal Nasopharyngeal Swab     Status: None   Collection Time: 09/16/19 12:50 PM   Specimen: Nasopharyngeal Swab  Result Value Ref Range Status   SARS Coronavirus 2 NEGATIVE NEGATIVE Final    Comment: (NOTE) SARS-CoV-2 target nucleic acids are NOT DETECTED. The SARS-CoV-2 RNA is generally detectable in upper and lower respiratory specimens during the acute phase of infection. Negative results do not preclude SARS-CoV-2 infection, do not rule out co-infections with other pathogens, and should not be used as the sole basis for treatment or other patient management decisions. Negative results must be combined with clinical observations, patient history, and epidemiological information. The expected result is Negative. Fact Sheet for Patients: SugarRoll.be Fact Sheet for Healthcare Providers: https://www.woods-mathews.com/ This test is not yet approved or cleared by the [place] FDA and  has been authorized for detection and/or diagnosis of SARS-CoV-2 by FDA under an Emergency Use Authorization (EUA). This EUA will remain  in effect (meaning this test can be used) for the duration of the COVID-19 declaration  under Section 56 4(b)(1) of the Act, 21 U.S.C. section 360bbb-3(b)(1), unless the authorization is terminated or revoked sooner. Performed at [facility], [place] [address] [place], [place] [address]   MRSA PCR Screening     Status: None   Collection Time: 09/16/19 11:34 PM   Specimen:  Nasal Mucosa; Nasopharyngeal  Result Value Ref Range Status   MRSA by PCR NEGATIVE NEGATIVE Final    Comment:        The GeneXpert MRSA Assay (FDA approved for NASAL specimens only), is one component of a comprehensive MRSA colonization surveillance program. It is not intended to diagnose MRSA infection nor to guide or monitor treatment for MRSA infections. Performed at [facility], [place] [address] [place], [place] [address]     Radiology Reports CT HEAD WO CONTRAST  Result Date: 09/17/2019 CLINICAL DATA:  Encephalopathy. EXAM: CT HEAD WITHOUT CONTRAST TECHNIQUE: Contiguous axial images were obtained from the base of the skull through the vertex without intravenous contrast. COMPARISON:  October 02, 2012. FINDINGS: Brain: Mild diffuse cortical atrophy is noted. Mild chronic ischemic white matter disease is noted. No mass effect or midline shift is noted. Ventricular size is within normal limits. There is no evidence of mass lesion, hemorrhage or acute infarction. Vascular: No hyperdense vessel or unexpected calcification. Skull: Normal. Negative for fracture or focal lesion. Sinuses/Orbits: No acute finding. Other: None. IMPRESSION: Mild diffuse cortical atrophy. Mild chronic ischemic white matter disease. No acute intracranial abnormality seen. Electronically Signed   By: Marijo Conception M.D.   On: 09/17/2019 11:46   CT ABDOMEN PELVIS W CONTRAST  Result Date: 09/16/2019 CLINICAL DATA:  Acute abdominal pain, generalized. EXAM: CT ABDOMEN AND PELVIS WITH CONTRAST TECHNIQUE: Multidetector CT imaging of the abdomen and pelvis was performed using the standard protocol following bolus administration of intravenous contrast. CONTRAST:  188m OMNIPAQUE IOHEXOL 300 MG/ML  SOLN COMPARISON:  10/03/2018 FINDINGS: Lower chest: Small subpleural lymph node in the right lower lobe stable since 2016. Signs of scarring and mild subpleural reticulation, unchanged. No signs of  consolidation or evidence of pleural effusion. Hepatobiliary: Liver is unremarkable. There is moderate distension of the gallbladder with signs of gallbladder wall thickening and pericholecystic stranding. Wall thickness difficult to read on CT but is much as 4 mm despite the distended appearance of the gallbladder. There is no intra or extrahepatic biliary ductal distension. Pancreas: Unremarkable. No pancreatic ductal dilatation or surrounding inflammatory changes. Spleen: Normal in size without focal abnormality. Adrenals/Urinary Tract: Signs of right nephrectomy with stable small area of soft tissue nodularity along the ligated right renal artery measuring approximately 1.5 cm this has been present since 2007. Adrenal glands are normal. Small presumed cysts in the left kidney are unchanged. Stomach/Bowel: No signs of acute gastrointestinal process. Normal appendix. Vascular/Lymphatic: Calcified atherosclerotic changes of the abdominal aorta up to 3 x 2.7 cm in greatest axial dimension. No signs of adenopathy in the upper abdomen or retroperitoneum. No signs of pelvic adenopathy. Vascular disease extends into branch vessels throughout the abdomen and pelvis. Reproductive: Prostatomegaly. No sign of pelvic adenopathy. Other: No signs of free air. No abscess. Musculoskeletal: Spinal degenerative changes with signs of lower lumbar laminectomy. Grade 1 anterolisthesis of L5 on S1. Finding similar to prior study. Also mild anterolisthesis of L4 on L5. IMPRESSION: 1. CT findings of acute cholecystitis. 2. Signs of right nephrectomy with stable small area of soft tissue nodularity along the ligated right renal artery. Unchanged since 2007. 3. Aortic atherosclerosis.  4. Prostatomegaly. Aortic Atherosclerosis (ICD10-I70.0). Electronically Signed   By: Zetta Bills M.D.   On: 09/16/2019 12:39   NM Hepato W/EjeCT Fract  Result Date: 09/17/2019 CLINICAL DATA:  RIGHT upper quadrant pain with nausea and vomiting for 3  days EXAM: NUCLEAR MEDICINE HEPATOBILIARY IMAGING TECHNIQUE: Sequential images of the abdomen were obtained out to 60 minutes following intravenous administration of radiopharmaceutical. RADIOPHARMACEUTICALS:  5.35 mCi Tc-21m Choletec IV Pharmaceutical: Morphine sulfate 3 mg IV COMPARISON:  Ultrasound abdomen 09/16/2019 FINDINGS: Normal tracer extraction from bloodstream indicating normal hepatocellular function. Normal excretion of tracer into biliary tree. Small bowel visualized at 14 minutes. At 1 hour, gallbladder had not visualized. Patient then received morphine and imaging was continued for an additional 30 minutes. Gallbladder failed to visualize following morphine augmentation. Findings are consistent with cystic duct obstruction and acute cholecystitis. IMPRESSION: Patent CBD. Nonvisualization of the gallbladder despite morphine augmentation consistent with cystic duct obstruction and acute cholecystitis. Electronically Signed   By: MLavonia DanaM.D.   On: 09/17/2019 14:36   UKoreaAbdomen Limited  Result Date: 09/16/2019 CLINICAL DATA:  Abdominal pain. CT abdomen and pelvis today demonstrated findings worrisome for cholecystitis. EXAM: ULTRASOUND ABDOMEN LIMITED RIGHT UPPER QUADRANT COMPARISON:  CT abdomen and pelvis earlier today. FINDINGS: Gallbladder: No gallstones or pericholecystic fluid. Sonographer reports negative Murphy's sign. The gallbladder is mildly distended and there is minimal wall thickening at 0.4 cm. Common bile duct: Diameter: 0.8 cm Liver: No focal lesion. Echogenicity is increased. Portal vein is patent on color Doppler imaging with normal direction of blood flow towards the liver. Other: None. IMPRESSION: Negative for gallstones or acute cholecystitis. Fatty infiltration of the liver. Electronically Signed   By: TInge RiseM.D.   On: 09/16/2019 13:59   IR Perc Cholecystostomy  Result Date: 09/18/2019 CLINICAL DATA:  Acute cholecystitis, currently poor operative candidate  EXAM: PERCUTANEOUS CHOLECYSTOSTOMY TUBE PLACEMENT WITH ULTRASOUND AND FLUOROSCOPIC GUIDANCE FLUOROSCOPY TIME:  0.5 minutes; 197 uGym2 DAP TECHNIQUE: The procedure, risks (including but not limited to bleeding, infection, organ damage ), benefits, and alternatives were explained to the patient and family. Questions regarding the procedure were encouraged and answered. The family understands and consents to the procedure. Survey ultrasound of the abdomen was performed and an appropriate skin entry site was identified. Skin site was marked, prepped with Betadine, and draped in usual sterile fashion, and infiltrated locally with 1% lidocaine. Intravenous Fentanyl 1068m and Versed 81m48mere administered as conscious sedation during continuous monitoring of the patient's level of consciousness and physiological / cardiorespiratory status by the radiology RN, with a total moderate sedation time of 10 minutes. Under real-time ultrasound guidance, gallbladder was accessed using a transhepatic approach with a 21-gauge needle. Ultrasound image documentation was saved. Bile returned through the hub. Needle was exchanged over a 018 guidewire for transitional dilator which allowed placement of 035 J wire. Over this, a 10.2 French pigtail catheter was advanced and formed centrally in the gallbladder lumen. Small contrast injection confirmed appropriate position. Catheter secured externally with 0 Prolene suture and placed external drain bag. Patient tolerated the procedure well. COMPLICATIONS: COMPLICATIONS none IMPRESSION: 1. Technically successful percutaneous cholecystostomy tube placement with ultrasound and fluoroscopic guidance. Electronically Signed   By: D  Lucrezia EuropeD.   On: 09/18/2019 14:56   DG Chest Portable 1 View  Result Date: 09/16/2019 CLINICAL DATA:  Fever, dyspnea. EXAM: PORTABLE CHEST 1 VIEW COMPARISON:  Chest radiograph 12/12/2018 FINDINGS: The patient is significantly rotated to the right, limiting  evaluation. Heart size  within normal limits. No definite airspace consolidation within the lungs. No evidence of pleural effusion or pneumothorax. No acute bony abnormality. Overlying cardiac monitoring leads. IMPRESSION: Patient significantly rotated to the right, limiting evaluation. No definite airspace consolidation. Electronically Signed   By: Kellie Simmering DO   On: 09/16/2019 15:16   DG Abd Portable 1V  Result Date: 09/20/2019 CLINICAL DATA:  Biliary drainage tube. EXAM: PORTABLE ABDOMEN - 1 VIEW COMPARISON:  September 18, 2019. FINDINGS: The bowel gas pattern is normal. Percutaneous drainage catheter is noted in right upper quadrant. No radio-opaque calculi or other significant radiographic abnormality are seen. IMPRESSION: Percutaneous drainage catheter is noted in right upper quadrant. Whether or not catheter tip is positioned within the gallbladder lumen cannot be determined by this radiograph. Electronically Signed   By: Marijo Conception M.D.   On: 09/20/2019 11:17   ECHOCARDIOGRAM COMPLETE  Result Date: 09/19/2019   ECHOCARDIOGRAM REPORT   Patient Name:   PENIEL HASS Date of Exam: 09/19/2019 Medical Rec #:  [id number]   Height:       72.0 in Accession #:    [id number]  Weight:       178.1 lb Date of Birth:  03/22/1932  BSA:          2.03 m Patient Age:    75 years    BP:           137/69 mmHg Patient Gender: M           HR:           94 bpm. Exam Location:  Inpatient Procedure: 2D Echo Indications:    Atrial Fibrillation 427.31 / I48.91  History:        Patient has prior history of Echocardiogram examinations, most                 recent 12/12/2018. CAD; Risk Factors:Hypertension. AKI                 Sepsis.  Sonographer:    Vikki Ports Turrentine Referring Phys: [id number] DANIEL V THOMPSON  Sonographer Comments: Suboptimal subcostal window due to patient body habitus. IMPRESSIONS  1. Left ventricular ejection fraction, by visual estimation, is 55 to 60%. The left ventricle has normal function. There is  mildly increased left ventricular hypertrophy.  2. Left ventricular diastolic parameters are indeterminate.  3. The left ventricle has no regional wall motion abnormalities.  4. Global right ventricle has normal systolic function.The right ventricular size is normal. No increase in right ventricular wall thickness.  5. Left atrial size was moderately dilated.  6. Right atrial size was normal.  7. The mitral valve is normal in structure. Mild to moderate mitral valve regurgitation.  8. Very eccentric anterior directed MR jet. The degree of MR may be underestimated due to the eccentrity of the jet. The MV to AV VTI ratio is 0.6 suggesting mild MR.  9. The tricuspid valve is normal in structure. Tricuspid valve regurgitation is trivial. 10. The aortic valve is tricuspid. Aortic valve regurgitation is not visualized. No evidence of aortic valve sclerosis or stenosis. 11. Pulmonic regurgitation is mild. 12. The pulmonic valve was not well visualized. Pulmonic valve regurgitation is mild. 13. The interatrial septum was not well visualized. FINDINGS  Left Ventricle: Left ventricular ejection fraction, by visual estimation, is 55 to 60%. The left ventricle has normal function. The left ventricle has no regional wall motion abnormalities. There is mildly increased left ventricular hypertrophy. Left ventricular diastolic parameters are  indeterminate. Right Ventricle: The right ventricular size is normal. No increase in right ventricular wall thickness. Global RV systolic function is has normal systolic function. Left Atrium: Left atrial size was moderately dilated. Right Atrium: Right atrial size was normal in size Pericardium: There is no evidence of pericardial effusion. Mitral Valve: The mitral valve is normal in structure. Mild to moderate mitral valve regurgitation. Very eccentric anterior directed MR jet. The degree of MR may be underestimated due to the eccentrity of the jet. The MV to AV VTI ratio is 0.6 suggesting   mild MR. Tricuspid Valve: The tricuspid valve is normal in structure. Tricuspid valve regurgitation is trivial. Aortic Valve: The aortic valve is tricuspid. Aortic valve regurgitation is not visualized. The aortic valve is structurally normal, with no evidence of sclerosis or stenosis. Aortic valve mean gradient measures 7.3 mmHg. Aortic valve peak gradient measures 13.7 mmHg. Aortic valve area, by VTI measures 1.77 cm. Pulmonic Valve: The pulmonic valve was not well visualized. Pulmonic valve regurgitation is mild. Pulmonic regurgitation is mild. No evidence of pulmonic stenosis. Aorta: The aortic root is normal in size and structure. Pulmonary Artery: Indeterminate PASP, inadequate TR jet. Venous: The inferior vena cava was not well visualized. IAS/Shunts: The interatrial septum was not well visualized.  LEFT VENTRICLE PLAX 2D LVIDd:         4.50 cm LVIDs:         3.30 cm LV PW:         1.20 cm LV IVS:        1.20 cm LVOT diam:     2.10 cm LV SV:         48 ml LV SV Index:   23.80 LVOT Area:     3.46 cm  RIGHT VENTRICLE RV S prime:     16.00 cm/s TAPSE (M-mode): 1.6 cm LEFT ATRIUM              Index       RIGHT ATRIUM           Index LA diam:        4.60 cm  2.27 cm/m  RA Area:     13.90 cm LA Vol (A2C):   103.0 ml 50.79 ml/m RA Volume:   27.40 ml  13.51 ml/m LA Vol (A4C):   52.3 ml  25.79 ml/m LA Biplane Vol: 78.9 ml  38.90 ml/m  AORTIC VALVE AV Area (Vmax):    1.72 cm AV Area (Vmean):   1.81 cm AV Area (VTI):     1.77 cm AV Vmax:           185.22 cm/s AV Vmean:          124.794 cm/s AV VTI:            0.352 m AV Peak Grad:      13.7 mmHg AV Mean Grad:      7.3 mmHg LVOT Vmax:         92.03 cm/s LVOT Vmean:        65.200 cm/s LVOT VTI:          0.180 m LVOT/AV VTI ratio: 0.51  AORTA Ao Root diam: 3.40 cm MITRAL VALVE                        TRICUSPID VALVE MV Area (PHT): 3.91 cm             TR Peak grad:   13.4 mmHg MV VTI:  0.21 m               TR Vmax:        183.00 cm/s MV PHT:        56.26  msec MV Decel Time: 194 msec             SHUNTS MV E velocity: 84.27 cm/s 103 cm/s  Systemic VTI:  0.18 m MV A velocity: 79.70 cm/s 70.3 cm/s Systemic Diam: 2.10 cm MV E/A ratio:  1.06       1.5  Carlyle Dolly MD Electronically signed by Carlyle Dolly MD Signature Date/Time: 09/19/2019/12:26:36 PM    Final     Lab Data:  CBC: Recent Labs  Lab 09/18/19 0217 09/19/19 0158 09/20/19 0214 09/21/19 0214 09/22/19 0638 09/23/19 0500  WBC 16.1* 12.9* 8.6 9.6 11.6* 13.8*  NEUTROABS 14.3* 11.3* 7.6 6.7  --  10.8*  HGB 8.0* 8.6* 8.8* 7.9* 10.2* 10.0*  HCT 26.6* 29.0* 27.7* 24.6* 32.6* 32.6*  MCV 86.4 85.8 83.9 80.9 84.2 84.2  PLT 176 205 208 199 261 035   Basic Metabolic Panel: Recent Labs  Lab 09/19/19 0158 09/20/19 0214 09/21/19 0214 09/21/19 1555 09/22/19 0500 09/22/19 0638 09/23/19 0500  NA 138 138 142  --  139 140 141  K 3.9 3.1* 2.8* 4.1 4.5 3.3* 3.8  CL 106 105 108  --  106 108 110  CO2 '23 24 25  ' --  '23 24 22  ' GLUCOSE 139* 130* 88  --  114* 108* 97  BUN '20 16 11  ' --  '12 11 11  ' CREATININE 0.95 0.83 0.86  --  0.96 0.87 0.94  CALCIUM 8.5* 8.5* 8.5*  --  8.3* 8.5* 8.6*  MG 1.9 2.1  --   --  2.3 2.1 1.9   GFR: Estimated Creatinine Clearance: 64.2 mL/min (by C-G formula based on SCr of 0.94 mg/dL). Liver Function Tests: Recent Labs  Lab 09/20/19 0214 09/21/19 0214 09/22/19 0500 09/22/19 0638 09/23/19 0500  AST 27 30 57* 29 39  ALT 59* 51* 53* 54* 67*  ALKPHOS 76 65 67 64 64  BILITOT 0.9 1.0 2.1* 1.3* 0.8  PROT 5.6* 5.4* 6.2* 6.0* 5.7*  ALBUMIN 2.4* 2.4* 2.8* 2.8* 2.6*   No results for input(s): LIPASE, AMYLASE in the last 168 hours. No results for input(s): AMMONIA in the last 168 hours. Coagulation Profile: Recent Labs  Lab 09/18/19 0905  INR 1.2   Cardiac Enzymes: No results for input(s): CKTOTAL, CKMB, CKMBINDEX, TROPONINI in the last 168 hours. BNP (last 3 results) No results for input(s): PROBNP in the last 8760 hours. HbA1C: No results for  input(s): HGBA1C in the last 72 hours. CBG: Recent Labs  Lab 09/23/19 2150 09/24/19 0008 09/24/19 0359 09/24/19 0805 09/24/19 1226  GLUCAP 122* 121* 117* 95 102*   Lipid Profile: No results for input(s): CHOL, HDL, LDLCALC, TRIG, CHOLHDL, LDLDIRECT in the last 72 hours. Thyroid Function Tests: No results for input(s): TSH, T4TOTAL, FREET4, T3FREE, THYROIDAB in the last 72 hours. Anemia Panel: No results for input(s): VITAMINB12, FOLATE, FERRITIN, TIBC, IRON, RETICCTPCT in the last 72 hours. Urine analysis:    Component Value Date/Time   COLORURINE YELLOW 09/16/2019 1455   APPEARANCEUR CLEAR 09/16/2019 1455   LABSPEC 1.010 09/16/2019 1455   PHURINE 7.0 09/16/2019 1455   GLUCOSEU NEGATIVE 09/16/2019 1455   HGBUR SMALL (A) 09/16/2019 1455   BILIRUBINUR NEGATIVE 09/16/2019 1455   [place] 09/16/2019 1455   PROTEINUR 100 (A) 09/16/2019 1455  UROBILINOGEN 1.0 11/27/2013 0947   NITRITE NEGATIVE 09/16/2019 1455   LEUKOCYTESUR NEGATIVE 09/16/2019 1455     Damin Salido M.D. [facility] 09/24/2019, 1:54 PM   Call night coverage person covering after 7pm

## 2019-09-25 LAB — GLUCOSE, CAPILLARY
Glucose-Capillary: 102 mg/dL — ABNORMAL HIGH (ref 70–99)
Glucose-Capillary: 114 mg/dL — ABNORMAL HIGH (ref 70–99)
Glucose-Capillary: 115 mg/dL — ABNORMAL HIGH (ref 70–99)
Glucose-Capillary: 115 mg/dL — ABNORMAL HIGH (ref 70–99)
Glucose-Capillary: 120 mg/dL — ABNORMAL HIGH (ref 70–99)
Glucose-Capillary: 136 mg/dL — ABNORMAL HIGH (ref 70–99)
Glucose-Capillary: 144 mg/dL — ABNORMAL HIGH (ref 70–99)

## 2019-09-25 MED ORDER — QUETIAPINE FUMARATE 25 MG PO TABS: 25.0000 mg | ORAL_TABLET | Freq: Every day | ORAL | Status: DC

## 2019-09-25 MED ORDER — QUETIAPINE FUMARATE 25 MG PO TABS: 12.5000 mg | ORAL_TABLET | Freq: Every day | ORAL | Status: DC

## 2019-09-25 MED ORDER — PREDNISONE 5 MG PO TABS
10.0000 mg | ORAL_TABLET | Freq: Every day | ORAL | Status: AC
  Administered 2019-09-26: 10 mg via ORAL
  Filled 2019-09-25: qty 2

## 2019-09-25 MED ORDER — QUETIAPINE FUMARATE 25 MG PO TABS
25.0000 mg | ORAL_TABLET | ORAL | Status: DC
  Administered 2019-09-25 – 2019-09-29 (×5): 25 mg via ORAL
  Filled 2019-09-25 (×5): qty 1

## 2019-09-25 NOTE — TOC Progression Note (Signed)
Transition of Care [facility]) - Progression Note    Patient Details  Name: Derrick [name] MRN: [id number] Date of Birth: 1931-10-22  [facility] CM/SW Contact  Joi Leyva, Juliann Pulse, RN Phone Number: 09/25/2019, 1:41 PM  Clinical Narrative:  Bed offers given to son [name] who patient/spouse defers to-await choice.     Expected Discharge Plan: [place] Barriers to Discharge: Continued Medical Work up  Expected Discharge Plan and Services Expected Discharge Plan: [place]   Discharge Planning Services: CM Consult Post Acute Care Choice: [place] Living arrangements for the past 2 months: Single Family Home                                       Social Determinants of Health (SDOH) Interventions    Readmission Risk Interventions Readmission Risk Prevention Plan 09/22/2019  Transportation Screening Complete  Medication Review [employer]) Complete  PCP or Specialist appointment within 3-5 days of discharge Complete  HRI or Home Care Consult Complete  SW Recovery Care/Counseling Consult Complete  Palliative Care Screening Not [place] Complete  Some recent data might be hidden

## 2019-09-25 NOTE — Progress Notes (Addendum)
[facility]                                                                              Patient Demographics  Derrick [name], is a 85 y.o. male, DOB - [birth year], MOQ:[id number]  Admit date - 09/16/2019   Admitting Physician Derrick Filler, MD  Outpatient Primary MD for the patient is Derrick Pretty, MD  Outpatient specialists:   LOS - 9  days   Medical records reviewed and are as summarized below:    Chief Complaint  Patient presents with  . Abdominal Pain       Brief summary   Derrick Renne Odomis a 86 y.o.malewith medical history significant ofRA on prednisone and hydroxychloroquine, CAD and PAD, HTN. Patient presented to [facility] ED with c/o periumbilical to lower abd pain, onset 3 days ago. Associated with N/V. No change in BM. No urinary complaints. Went to PCP today and referred to ED. In ED, patient was found to be septic with WBC 17 K, temp 101.6 F, heart rate 134.  Acute transaminitis, COVID-19 negative.  CT abdomen pelvis worrisome for acute cholecystitis. Patient was admitted, general surgery was consulted  Assessment & Plan    Sepsis secondary to acute cholecystitis and E. coli bacteremia - Patient met sepsis criteria at the time of admission with fevers, leukocytosis, tachycardia, source likely due to acute cholecystitis -CT abdomen pelvis showed acute cholecystitis, patient also had transaminitis, rapidly progressive encephalopathy on admission -Blood cultures positive for E. coli.  Patient was placed on n.p.o. status, IV fluids and empiric IV antibiotics. General surgery was consulted -Status post cholecystostomy tube placed by IR on 12/11 with bilious drainage.   -Antibiotics narrowed to IV Zosyn per sensitivities -Transition from IV Solu-Cortef to oral prednisone (Patient noted to be on Plaquenil and chronic steroids for RA and steroid started due to worsening encephalopathy).  -GI was consulted on 12/11, recommended to continue current  management.  Defer to general surgery and IR, if patient does not improve with PERC drain or if LFTs continues to trend up, patient will need MRCP. -Continue current drain management per IR, eventually will need cholecystectomy, tolerating full liquid diet  E. coli bacteremia -Likely due to acute cholecystitis -Continue IV Zosyn, will likely need 10 to 14 days antibiotics  Rheumatoid arthritis - Plaquenil and prednisone were placed on hold and patient was started on IV stress dose steroids - IV Solu-Cortef as now transitioned to oral prednisone and slowly taper back to home dose (5 mg) - Restart Plaquenil outpatient once acute infection has resolved -Tapering prednisone to 10 mg tomorrow, then will keep on maintenance dose of 5 mg daily  A. fib with RVR -Patient was noted to be in atrial fibrillation with RVR post cholecystostomy tube on the evening of 12/11 and had to be placed on Cardizem drip -Heart rate now controlled, Cardizem drip has been off, currently tolerating oral Cardizem 60 mg every 6 hours -Continue chronic Plavix for CAD/PAD but not on chronic blood thinners   Acute metabolic encephalopathy, likely has underlying dementia at baseline -Per son, has baseline mild dementia, likely worsened now with sepsis, delirium and sundowning  -  Head CT negative for acute abnormalities, showed mild chronic atrophy and chronic ischemic changes -Olanzapine was discontinued on 12/17, was placed on Seroquel 25 mg.  Patient did extremely well and slept good overnight.  Today much more alert, oriented participated with PT - will request pharmacy to change Seroquel timing to 8pm.  Coronary disease/PAD -Currently no chest pain or shortness of breath, stable -Continue home regimen of aspirin, Lipitor, Plavix  -Outpatient follow-up with cardiology post discharge for preop clearance for possible cholecystectomy in about 6 to 8 weeks  Essential hypertension BP is fairly stable, continue  Cardizem  GERD Continue PPI  QT prolongation -EKG on 12/15 showed sinus rhythm, QTC 477 -Avoid haldol, keep potassium> 4, magnesium above  Code Status: Full CODE STATUS DVT Prophylaxis:  Lovenox Family Communication: Discussed all imaging results, lab results, explained to the patient's son, Derrick [name] on the phone.     Disposition Plan: Will need skilled nursing facility, social work aware, family in agreement  Time Spent in minutes 25 minutes  Procedures:  Cholecystostomy tube  Consultants:   Interventional radiology GI Surgery  Antimicrobials:   Anti-infectives (From admission, onward)   Start     Dose/Rate Route Frequency Ordered Stop   09/17/19 1000  hydroxychloroquine (PLAQUENIL) tablet 200 mg  Status:  Discontinued     200 mg Oral Daily 09/16/19 2340 09/16/19 2352   09/17/19 0000  piperacillin-tazobactam (ZOSYN) IVPB 3.375 g     3.375 g 12.5 mL/hr over 240 Minutes Intravenous Every 8 hours 09/16/19 2349     09/16/19 1230  piperacillin-tazobactam (ZOSYN) IVPB 3.375 g     3.375 g 100 mL/hr over 30 Minutes Intravenous  Once 09/16/19 1227 09/16/19 1347         Medications  Scheduled Meds: . aspirin  81 mg Oral Daily  . atorvastatin  40 mg Oral Daily  . Chlorhexidine Gluconate Cloth  6 each Topical Daily  . clopidogrel  75 mg Oral Daily  . diltiazem  60 mg Oral Q6H  . docusate sodium  250 mg Oral Daily  . enoxaparin (LOVENOX) injection  40 mg Subcutaneous Daily  . feeding supplement (ENSURE ENLIVE)  237 mL Oral BID BM  . feeding supplement (PRO-STAT SUGAR FREE 64)  30 mL Oral BID  . finasteride  5 mg Oral QPM  . insulin aspart  0-9 Units Subcutaneous Q4H  . mouth rinse  15 mL Mouth Rinse BID  . multivitamin with minerals  1 tablet Oral Daily  . omega-3 acid ethyl esters  1 g Oral Daily  . pantoprazole  40 mg Oral BID  . QUEtiapine  12.5 mg Oral QHS  . sertraline  25 mg Oral Daily  . sodium chloride flush  5 mL Intracatheter Q8H   Continuous  Infusions: . sodium chloride Stopped (09/22/19 2342)  . 0.9 % NaCl with KCl 40 mEq / L 75 mL/hr (09/25/19 0337)  . piperacillin-tazobactam (ZOSYN)  IV 3.375 g (09/25/19 0822)   PRN Meds:.sodium chloride, acetaminophen **OR** acetaminophen, labetalol, morphine injection, ondansetron **OR** ondansetron (ZOFRAN) IV, sodium chloride flush      Subjective:   Derrick [name] was seen and examined today.  Slept very well last night, much more alert and awake today.  No acute issues, did not require restraints or sitter today.  No active nausea vomiting or fevers.    Objective:   Vitals:   09/25/19 0019 09/25/19 0023 09/25/19 0500 09/25/19 1318  BP: 122/63 122/63 120/70 (!) 141/118  Pulse:  89 85 (!) 151  Resp:   19 18  Temp:   98.1 F (36.7 C)   TempSrc:   Oral   SpO2:  95% 95% 92%  Weight:      Height:        Intake/Output Summary (Last 24 hours) at 09/25/2019 1419 Last data filed at 09/25/2019 0600 Gross per 24 hour  Intake 1976.34 ml  Output 1970 ml  Net 6.34 ml     Wt Readings from Last 3 Encounters:  09/23/19 79.4 kg  07/07/19 83.5 kg  07/04/19 74.8 kg    Physical Exam  General: Sleepy at the time of my examination but subsequently alert and awake, NAD  Eyes:   HEENT:  Atraumatic, normocephalic  Cardiovascular: S1 S2 clear, RRR. No pedal edema b/l  Respiratory: CTAB, no wheezing, rales or rhonchi  Gastrointestinal: Soft, cholecystostomy tube+  Ext: no pedal edema bilaterally  Neuro: no new deficits  Musculoskeletal: No cyanosis, clubbing  Skin: No rashes  Psych: Mental status improving    Data Reviewed:  I have personally reviewed following labs and imaging studies  Micro Results Recent Results (from the past 240 hour(s))  Blood culture (routine x 2)     Status: Abnormal   Collection Time: 09/16/19 12:15 PM   Specimen: Right Antecubital; Blood  Result Value Ref Range Status   Specimen Description   Final    RIGHT ANTECUBITAL Performed at [facility], [address] [place], [place] [address]    Special Requests   Final    BOTTLES DRAWN AEROBIC AND ANAEROBIC Blood Culture adequate volume Performed at [facility], [address] [place], [place] [address]    Culture  Setup Time   Final    IN BOTH AEROBIC AND ANAEROBIC BOTTLES GRAM NEGATIVE RODS CRITICAL VALUE NOTED.  VALUE IS CONSISTENT WITH PREVIOUSLY REPORTED AND CALLED VALUE.    Culture (A)  Final    ESCHERICHIA COLI SUSCEPTIBILITIES PERFORMED ON PREVIOUS CULTURE WITHIN THE LAST 5 DAYS. Performed at [facility], [place] [address] [place], [place] [address]    Report Status 09/19/2019 FINAL  Final  Blood culture (routine x 2)     Status: Abnormal   Collection Time: 09/16/19 12:30 PM   Specimen: BLOOD LEFT WRIST  Result Value Ref Range Status   Specimen Description   Final    BLOOD LEFT WRIST Performed at [facility], [address] [place], [place] [address]    Special Requests   Final    BOTTLES DRAWN AEROBIC AND ANAEROBIC Blood Culture adequate volume Performed at [facility], [address] [place], [place] [address]    Culture  Setup Time   Final    IN BOTH AEROBIC AND ANAEROBIC BOTTLES GRAM NEGATIVE RODS CRITICAL RESULT CALLED TO, READ BACK BY AND VERIFIED WITH: B GREEN PHARMD 09/17/19 0428 JDW Performed at [facility], [place] [address] [place], [place] [address]    Culture ESCHERICHIA COLI (A)  Final   Report Status 09/19/2019 FINAL  Final   Organism ID, Bacteria ESCHERICHIA COLI  Final      Susceptibility   Escherichia coli - MIC*    AMPICILLIN >=32 RESISTANT Resistant     CEFAZOLIN >=64 RESISTANT Resistant     CEFEPIME <=1 SENSITIVE Sensitive     CEFTAZIDIME <=1 SENSITIVE Sensitive     CEFTRIAXONE <=1 SENSITIVE Sensitive     CIPROFLOXACIN <=0.25 SENSITIVE Sensitive     GENTAMICIN <=1 SENSITIVE  Sensitive     IMIPENEM <=0.25 SENSITIVE Sensitive     TRIMETH/SULFA <=20 SENSITIVE Sensitive      AMPICILLIN/SULBACTAM >=32 RESISTANT Resistant     PIP/TAZO 8 SENSITIVE Sensitive     * ESCHERICHIA COLI  Blood Culture ID Panel (Reflexed)     Status: Abnormal   Collection Time: 09/16/19 12:30 PM  Result Value Ref Range Status   Enterococcus species NOT DETECTED NOT DETECTED Final   Listeria monocytogenes NOT DETECTED NOT DETECTED Final   Staphylococcus species NOT DETECTED NOT DETECTED Final   Staphylococcus aureus (BCID) NOT DETECTED NOT DETECTED Final   Streptococcus species NOT DETECTED NOT DETECTED Final   Streptococcus agalactiae NOT DETECTED NOT DETECTED Final   Streptococcus pneumoniae NOT DETECTED NOT DETECTED Final   Streptococcus pyogenes NOT DETECTED NOT DETECTED Final   Acinetobacter baumannii NOT DETECTED NOT DETECTED Final   Enterobacteriaceae species DETECTED (A) NOT DETECTED Final    Comment: Enterobacteriaceae represent a large family of gram-negative bacteria, not a single organism. CRITICAL RESULT CALLED TO, READ BACK BY AND VERIFIED WITH: B GREEN PHARMD 09/17/19 0428 JDW    Enterobacter cloacae complex NOT DETECTED NOT DETECTED Final   Escherichia coli DETECTED (A) NOT DETECTED Final    Comment: CRITICAL RESULT CALLED TO, READ BACK BY AND VERIFIED WITH: B GREEN PHARMD 09/17/19 0428 JDW    Klebsiella oxytoca NOT DETECTED NOT DETECTED Final   Klebsiella pneumoniae NOT DETECTED NOT DETECTED Final   Proteus species NOT DETECTED NOT DETECTED Final   Serratia marcescens NOT DETECTED NOT DETECTED Final   Carbapenem resistance NOT DETECTED NOT DETECTED Final   Haemophilus influenzae NOT DETECTED NOT DETECTED Final   Neisseria meningitidis NOT DETECTED NOT DETECTED Final   Pseudomonas aeruginosa NOT DETECTED NOT DETECTED Final   Candida albicans NOT DETECTED NOT DETECTED Final   Candida glabrata NOT DETECTED NOT DETECTED Final   Candida krusei NOT DETECTED NOT DETECTED Final   Candida parapsilosis NOT DETECTED NOT DETECTED Final   Candida tropicalis NOT DETECTED NOT  DETECTED Final    Comment: Performed at [facility], [place] [address] [place], [place] [address]  SARS CORONAVIRUS 2 (TAT 6-24 HRS) Nasopharyngeal Nasopharyngeal Swab     Status: None   Collection Time: 09/16/19 12:50 PM   Specimen: Nasopharyngeal Swab  Result Value Ref Range Status   SARS Coronavirus 2 NEGATIVE NEGATIVE Final    Comment: (NOTE) SARS-CoV-2 target nucleic acids are NOT DETECTED. The SARS-CoV-2 RNA is generally detectable in upper and lower respiratory specimens during the acute phase of infection. Negative results do not preclude SARS-CoV-2 infection, do not rule out co-infections with other pathogens, and should not be used as the sole basis for treatment or other patient management decisions. Negative results must be combined with clinical observations, patient history, and epidemiological information. The expected result is Negative. Fact Sheet for Patients: SugarRoll.be Fact Sheet for Healthcare Providers: https://www.woods-mathews.com/ This test is not yet approved or cleared by the [place] FDA and  has been authorized for detection and/or diagnosis of SARS-CoV-2 by FDA under an Emergency Use Authorization (EUA). This EUA will remain  in effect (meaning this test can be used) for the duration of the COVID-19 declaration under Section 56 4(b)(1) of the Act, 21 U.S.C. section 360bbb-3(b)(1), unless the authorization is terminated or revoked sooner. Performed at [facility], [place] [address] [place], [place] [address]   MRSA PCR Screening     Status: None   Collection Time: 09/16/19 11:34 PM  Specimen: Nasal Mucosa; Nasopharyngeal  Result Value Ref Range Status   MRSA by PCR NEGATIVE NEGATIVE Final    Comment:        The GeneXpert MRSA Assay (FDA approved for NASAL specimens only), is one component of a comprehensive MRSA colonization surveillance program. It is not intended to diagnose  MRSA infection nor to guide or monitor treatment for MRSA infections. Performed at [facility], [place] [address], [place], [place] [address]     Radiology Reports CT HEAD WO CONTRAST  Result Date: 09/17/2019 CLINICAL DATA:  Encephalopathy. EXAM: CT HEAD WITHOUT CONTRAST TECHNIQUE: Contiguous axial images were obtained from the base of the skull through the vertex without intravenous contrast. COMPARISON:  October 02, 2012. FINDINGS: Brain: Mild diffuse cortical atrophy is noted. Mild chronic ischemic white matter disease is noted. No mass effect or midline shift is noted. Ventricular size is within normal limits. There is no evidence of mass lesion, hemorrhage or acute infarction. Vascular: No hyperdense vessel or unexpected calcification. Skull: Normal. Negative for fracture or focal lesion. Sinuses/Orbits: No acute finding. Other: None. IMPRESSION: Mild diffuse cortical atrophy. Mild chronic ischemic white matter disease. No acute intracranial abnormality seen. Electronically Signed   By: Marijo Conception M.D.   On: 09/17/2019 11:46   CT ABDOMEN PELVIS W CONTRAST  Result Date: 09/16/2019 CLINICAL DATA:  Acute abdominal pain, generalized. EXAM: CT ABDOMEN AND PELVIS WITH CONTRAST TECHNIQUE: Multidetector CT imaging of the abdomen and pelvis was performed using the standard protocol following bolus administration of intravenous contrast. CONTRAST:  132m OMNIPAQUE IOHEXOL 300 MG/ML  SOLN COMPARISON:  10/03/2018 FINDINGS: Lower chest: Small subpleural lymph node in the right lower lobe stable since 2016. Signs of scarring and mild subpleural reticulation, unchanged. No signs of consolidation or evidence of pleural effusion. Hepatobiliary: Liver is unremarkable. There is moderate distension of the gallbladder with signs of gallbladder wall thickening and pericholecystic stranding. Wall thickness difficult to read on CT but is much as 4 mm despite the distended appearance of the  gallbladder. There is no intra or extrahepatic biliary ductal distension. Pancreas: Unremarkable. No pancreatic ductal dilatation or surrounding inflammatory changes. Spleen: Normal in size without focal abnormality. Adrenals/Urinary Tract: Signs of right nephrectomy with stable small area of soft tissue nodularity along the ligated right renal artery measuring approximately 1.5 cm this has been present since 2007. Adrenal glands are normal. Small presumed cysts in the left kidney are unchanged. Stomach/Bowel: No signs of acute gastrointestinal process. Normal appendix. Vascular/Lymphatic: Calcified atherosclerotic changes of the abdominal aorta up to 3 x 2.7 cm in greatest axial dimension. No signs of adenopathy in the upper abdomen or retroperitoneum. No signs of pelvic adenopathy. Vascular disease extends into branch vessels throughout the abdomen and pelvis. Reproductive: Prostatomegaly. No sign of pelvic adenopathy. Other: No signs of free air. No abscess. Musculoskeletal: Spinal degenerative changes with signs of lower lumbar laminectomy. Grade 1 anterolisthesis of L5 on S1. Finding similar to prior study. Also mild anterolisthesis of L4 on L5. IMPRESSION: 1. CT findings of acute cholecystitis. 2. Signs of right nephrectomy with stable small area of soft tissue nodularity along the ligated right renal artery. Unchanged since 2007. 3. Aortic atherosclerosis. 4. Prostatomegaly. Aortic Atherosclerosis (ICD10-I70.0). Electronically Signed   By: GZetta BillsM.D.   On: 09/16/2019 12:39   NM Hepato W/EjeCT Fract  Result Date: 09/17/2019 CLINICAL DATA:  RIGHT upper quadrant pain with nausea and vomiting for 3 days EXAM: NUCLEAR MEDICINE HEPATOBILIARY IMAGING TECHNIQUE: Sequential images of  the abdomen were obtained out to 60 minutes following intravenous administration of radiopharmaceutical. RADIOPHARMACEUTICALS:  5.35 mCi Tc-35m Choletec IV Pharmaceutical: Morphine sulfate 3 mg IV COMPARISON:  Ultrasound  abdomen 09/16/2019 FINDINGS: Normal tracer extraction from bloodstream indicating normal hepatocellular function. Normal excretion of tracer into biliary tree. Small bowel visualized at 14 minutes. At 1 hour, gallbladder had not visualized. Patient then received morphine and imaging was continued for an additional 30 minutes. Gallbladder failed to visualize following morphine augmentation. Findings are consistent with cystic duct obstruction and acute cholecystitis. IMPRESSION: Patent CBD. Nonvisualization of the gallbladder despite morphine augmentation consistent with cystic duct obstruction and acute cholecystitis. Electronically Signed   By: MLavonia DanaM.D.   On: 09/17/2019 14:36   UKoreaAbdomen Limited  Result Date: 09/16/2019 CLINICAL DATA:  Abdominal pain. CT abdomen and pelvis today demonstrated findings worrisome for cholecystitis. EXAM: ULTRASOUND ABDOMEN LIMITED RIGHT UPPER QUADRANT COMPARISON:  CT abdomen and pelvis earlier today. FINDINGS: Gallbladder: No gallstones or pericholecystic fluid. Sonographer reports negative Murphy's sign. The gallbladder is mildly distended and there is minimal wall thickening at 0.4 cm. Common bile duct: Diameter: 0.8 cm Liver: No focal lesion. Echogenicity is increased. Portal vein is patent on color Doppler imaging with normal direction of blood flow towards the liver. Other: None. IMPRESSION: Negative for gallstones or acute cholecystitis. Fatty infiltration of the liver. Electronically Signed   By: TInge RiseM.D.   On: 09/16/2019 13:59   IR Perc Cholecystostomy  Result Date: 09/18/2019 CLINICAL DATA:  Acute cholecystitis, currently poor operative candidate EXAM: PERCUTANEOUS CHOLECYSTOSTOMY TUBE PLACEMENT WITH ULTRASOUND AND FLUOROSCOPIC GUIDANCE FLUOROSCOPY TIME:  0.5 minutes; 197 uGym2 DAP TECHNIQUE: The procedure, risks (including but not limited to bleeding, infection, organ damage ), benefits, and alternatives were explained to the patient and  family. Questions regarding the procedure were encouraged and answered. The family understands and consents to the procedure. Survey ultrasound of the abdomen was performed and an appropriate skin entry site was identified. Skin site was marked, prepped with Betadine, and draped in usual sterile fashion, and infiltrated locally with 1% lidocaine. Intravenous Fentanyl 10813m and Versed 13m49mere administered as conscious sedation during continuous monitoring of the patient's level of consciousness and physiological / cardiorespiratory status by the radiology RN, with a total moderate sedation time of 10 minutes. Under real-time ultrasound guidance, gallbladder was accessed using a transhepatic approach with a 21-gauge needle. Ultrasound image documentation was saved. Bile returned through the hub. Needle was exchanged over a 018 guidewire for transitional dilator which allowed placement of 035 J wire. Over this, a 10.2 French pigtail catheter was advanced and formed centrally in the gallbladder lumen. Small contrast injection confirmed appropriate position. Catheter secured externally with 0 Prolene suture and placed external drain bag. Patient tolerated the procedure well. COMPLICATIONS: COMPLICATIONS none IMPRESSION: 1. Technically successful percutaneous cholecystostomy tube placement with ultrasound and fluoroscopic guidance. Electronically Signed   By: D  Lucrezia EuropeD.   On: 09/18/2019 14:56   DG Chest Portable 1 View  Result Date: 09/16/2019 CLINICAL DATA:  Fever, dyspnea. EXAM: PORTABLE CHEST 1 VIEW COMPARISON:  Chest radiograph 12/12/2018 FINDINGS: The patient is significantly rotated to the right, limiting evaluation. Heart size within normal limits. No definite airspace consolidation within the lungs. No evidence of pleural effusion or pneumothorax. No acute bony abnormality. Overlying cardiac monitoring leads. IMPRESSION: Patient significantly rotated to the right, limiting evaluation. No definite  airspace consolidation. Electronically Signed   By: KylKellie Simmering   On: 09/16/2019 15:16  DG Abd Portable 1V  Result Date: 09/20/2019 CLINICAL DATA:  Biliary drainage tube. EXAM: PORTABLE ABDOMEN - 1 VIEW COMPARISON:  September 18, 2019. FINDINGS: The bowel gas pattern is normal. Percutaneous drainage catheter is noted in right upper quadrant. No radio-opaque calculi or other significant radiographic abnormality are seen. IMPRESSION: Percutaneous drainage catheter is noted in right upper quadrant. Whether or not catheter tip is positioned within the gallbladder lumen cannot be determined by this radiograph. Electronically Signed   By: Marijo Conception M.D.   On: 09/20/2019 11:17   ECHOCARDIOGRAM COMPLETE  Result Date: 09/19/2019   ECHOCARDIOGRAM REPORT   Patient Name:   ERION WEIGHTMAN Date of Exam: 09/19/2019 Medical Rec #:  [id number]   Height:       72.0 in Accession #:    [id number]  Weight:       178.1 lb Date of Birth:  August 20, 1932  BSA:          2.03 m Patient Age:    58 years    BP:           137/69 mmHg Patient Gender: M           HR:           94 bpm. Exam Location:  Inpatient Procedure: 2D Echo Indications:    Atrial Fibrillation 427.31 / I48.91  History:        Patient has prior history of Echocardiogram examinations, most                 recent 12/12/2018. CAD; Risk Factors:Hypertension. AKI                 Sepsis.  Sonographer:    Vikki Ports Turrentine Referring Phys: [id number] DANIEL V THOMPSON  Sonographer Comments: Suboptimal subcostal window due to patient body habitus. IMPRESSIONS  1. Left ventricular ejection fraction, by visual estimation, is 55 to 60%. The left ventricle has normal function. There is mildly increased left ventricular hypertrophy.  2. Left ventricular diastolic parameters are indeterminate.  3. The left ventricle has no regional wall motion abnormalities.  4. Global right ventricle has normal systolic function.The right ventricular size is normal. No increase in right ventricular  wall thickness.  5. Left atrial size was moderately dilated.  6. Right atrial size was normal.  7. The mitral valve is normal in structure. Mild to moderate mitral valve regurgitation.  8. Very eccentric anterior directed MR jet. The degree of MR may be underestimated due to the eccentrity of the jet. The MV to AV VTI ratio is 0.6 suggesting mild MR.  9. The tricuspid valve is normal in structure. Tricuspid valve regurgitation is trivial. 10. The aortic valve is tricuspid. Aortic valve regurgitation is not visualized. No evidence of aortic valve sclerosis or stenosis. 11. Pulmonic regurgitation is mild. 12. The pulmonic valve was not well visualized. Pulmonic valve regurgitation is mild. 13. The interatrial septum was not well visualized. FINDINGS  Left Ventricle: Left ventricular ejection fraction, by visual estimation, is 55 to 60%. The left ventricle has normal function. The left ventricle has no regional wall motion abnormalities. There is mildly increased left ventricular hypertrophy. Left ventricular diastolic parameters are indeterminate. Right Ventricle: The right ventricular size is normal. No increase in right ventricular wall thickness. Global RV systolic function is has normal systolic function. Left Atrium: Left atrial size was moderately dilated. Right Atrium: Right atrial size was normal in size Pericardium: There is no evidence of pericardial effusion. Mitral Valve: The mitral  valve is normal in structure. Mild to moderate mitral valve regurgitation. Very eccentric anterior directed MR jet. The degree of MR may be underestimated due to the eccentrity of the jet. The MV to AV VTI ratio is 0.6 suggesting  mild MR. Tricuspid Valve: The tricuspid valve is normal in structure. Tricuspid valve regurgitation is trivial. Aortic Valve: The aortic valve is tricuspid. Aortic valve regurgitation is not visualized. The aortic valve is structurally normal, with no evidence of sclerosis or stenosis. Aortic valve  mean gradient measures 7.3 mmHg. Aortic valve peak gradient measures 13.7 mmHg. Aortic valve area, by VTI measures 1.77 cm. Pulmonic Valve: The pulmonic valve was not well visualized. Pulmonic valve regurgitation is mild. Pulmonic regurgitation is mild. No evidence of pulmonic stenosis. Aorta: The aortic root is normal in size and structure. Pulmonary Artery: Indeterminate PASP, inadequate TR jet. Venous: The inferior vena cava was not well visualized. IAS/Shunts: The interatrial septum was not well visualized.  LEFT VENTRICLE PLAX 2D LVIDd:         4.50 cm LVIDs:         3.30 cm LV PW:         1.20 cm LV IVS:        1.20 cm LVOT diam:     2.10 cm LV SV:         48 ml LV SV Index:   23.80 LVOT Area:     3.46 cm  RIGHT VENTRICLE RV S prime:     16.00 cm/s TAPSE (M-mode): 1.6 cm LEFT ATRIUM              Index       RIGHT ATRIUM           Index LA diam:        4.60 cm  2.27 cm/m  RA Area:     13.90 cm LA Vol (A2C):   103.0 ml 50.79 ml/m RA Volume:   27.40 ml  13.51 ml/m LA Vol (A4C):   52.3 ml  25.79 ml/m LA Biplane Vol: 78.9 ml  38.90 ml/m  AORTIC VALVE AV Area (Vmax):    1.72 cm AV Area (Vmean):   1.81 cm AV Area (VTI):     1.77 cm AV Vmax:           185.22 cm/s AV Vmean:          124.794 cm/s AV VTI:            0.352 m AV Peak Grad:      13.7 mmHg AV Mean Grad:      7.3 mmHg LVOT Vmax:         92.03 cm/s LVOT Vmean:        65.200 cm/s LVOT VTI:          0.180 m LVOT/AV VTI ratio: 0.51  AORTA Ao Root diam: 3.40 cm MITRAL VALVE                        TRICUSPID VALVE MV Area (PHT): 3.91 cm             TR Peak grad:   13.4 mmHg MV VTI:        0.21 m               TR Vmax:        183.00 cm/s MV PHT:        56.26 msec MV Decel Time: 194 msec  SHUNTS MV E velocity: 84.27 cm/s 103 cm/s  Systemic VTI:  0.18 m MV A velocity: 79.70 cm/s 70.3 cm/s Systemic Diam: 2.10 cm MV E/A ratio:  1.06       1.5  Carlyle Dolly MD Electronically signed by Carlyle Dolly MD Signature Date/Time: 09/19/2019/12:26:36 PM     Final     Lab Data:  CBC: Recent Labs  Lab 09/19/19 0158 09/20/19 0214 09/21/19 0214 09/22/19 0638 09/23/19 0500  WBC 12.9* 8.6 9.6 11.6* 13.8*  NEUTROABS 11.3* 7.6 6.7  --  10.8*  HGB 8.6* 8.8* 7.9* 10.2* 10.0*  HCT 29.0* 27.7* 24.6* 32.6* 32.6*  MCV 85.8 83.9 80.9 84.2 84.2  PLT 205 208 199 261 539   Basic Metabolic Panel: Recent Labs  Lab 09/19/19 0158 09/20/19 0214 09/21/19 0214 09/21/19 1555 09/22/19 0500 09/22/19 0638 09/23/19 0500  NA 138 138 142  --  139 140 141  K 3.9 3.1* 2.8* 4.1 4.5 3.3* 3.8  CL 106 105 108  --  106 108 110  CO2 _0 --  _1 GLUCOSE 139* 130* 88  --  114* 108* 97  BUN _2 --  _3 CREATININE 0.95 0.83 0.86  --  0.96 0.87 0.94  CALCIUM 8.5* 8.5* 8.5*  --  8.3* 8.5* 8.6*  MG 1.9 2.1  --   --  2.3 2.1 1.9   GFR: Estimated Creatinine Clearance: 64.2 mL/min (by C-G formula based on SCr of 0.94 mg/dL). Liver Function Tests: Recent Labs  Lab 09/20/19 0214 09/21/19 0214 09/22/19 0500 09/22/19 0638 09/23/19 0500  AST 27 30 57* 29 39  ALT 59* 51* 53* 54* 67*  ALKPHOS 76 65 67 64 64  BILITOT 0.9 1.0 2.1* 1.3* 0.8  PROT 5.6* 5.4* 6.2* 6.0* 5.7*  ALBUMIN 2.4* 2.4* 2.8* 2.8* 2.6*   No results for input(s): LIPASE, AMYLASE in the last 168 hours. No results for input(s): AMMONIA in the last 168 hours. Coagulation Profile: No results for input(s): INR, PROTIME in the last 168 hours. Cardiac Enzymes: No results for input(s): CKTOTAL, CKMB, CKMBINDEX, TROPONINI in the last 168 hours. BNP (last 3 results) No results for input(s): PROBNP in the last 8760 hours. HbA1C: No results for input(s): HGBA1C in the last 72 hours. CBG: Recent Labs  Lab 09/24/19 1226 09/24/19 1701 09/24/19 1943 09/25/19 0004 09/25/19 1133  GLUCAP 102* 127* 167* 136* 115*   Lipid Profile: No results for input(s): CHOL, HDL, LDLCALC, TRIG, CHOLHDL, LDLDIRECT in the last 72 hours. Thyroid Function Tests: No results for input(s): TSH,  T4TOTAL, FREET4, T3FREE, THYROIDAB in the last 72 hours. Anemia Panel: No results for input(s): VITAMINB12, FOLATE, FERRITIN, TIBC, IRON, RETICCTPCT in the last 72 hours. Urine analysis:    Component Value Date/Time   COLORURINE YELLOW 09/16/2019 1455   APPEARANCEUR CLEAR 09/16/2019 1455   LABSPEC 1.010 09/16/2019 1455   PHURINE 7.0 09/16/2019 1455   GLUCOSEU NEGATIVE 09/16/2019 1455   HGBUR SMALL (A) 09/16/2019 1455   BILIRUBINUR NEGATIVE 09/16/2019 1455   KETONESUR NEGATIVE 09/16/2019 1455   PROTEINUR 100 (A) 09/16/2019 1455   UROBILINOGEN 1.0 11/27/2013 0947   NITRITE NEGATIVE 09/16/2019 1455   LEUKOCYTESUR NEGATIVE 09/16/2019 1455     Maleiyah Releford M.D. [facility] 09/25/2019, 2:19 PM   Call night coverage person covering after 7pm

## 2019-09-25 NOTE — Progress Notes (Signed)
Physical Therapy Treatment Patient Details Name: Derrick [name] MRN: [id number] DOB: [birth year] Today's Date: 09/25/2019    History of Present Illness Pt admitted through ED 09/16/19 with abdominal pain.  Pt dx with sesis 2* ecoli bacteremia and acute cholecystitis.  Pt with hx of RA, memory loss, CKD, CAD, lumbar lami and R transmetatarsal amputation (18)    PT Comments    Patient made excellent progress today and was eager to participate in therapy session. He demonstrated improved ability to follow simple cues for bed mobility and required min assist to manage LE's and raise trunk up to sit EOB. Pt continues to require mod assist for sit<>stand transfer due to LE weakness and progressed gait distance to ~60 feet with min assist for balance and cues for step pattern and posture throughout. He will continue to benefit from skilled PT interventions to address impairments and progress independence and safety with functional mobility.   Follow Up Recommendations  SNF     Equipment Recommendations  None recommended by PT    Recommendations for Other Services       Precautions / Restrictions Precautions Precautions: Fall Restrictions Weight Bearing Restrictions: No    Mobility  Bed Mobility Overal bed mobility: Needs Assistance Bed Mobility: Supine to Sit     Supine to sit: Min assist;+2 for safety/equipment     General bed mobility comments: verbal cues provided for pt to roll to Lt side and to press up to sit EOB, assistance required to bring LE's fully off EOB and to initiate press up from side.  Transfers Overall transfer level: Needs assistance Equipment used: Rolling walker (2 wheeled) Transfers: Sit to/from Stand Sit to Stand: Mod assist;+2 safety/equipment         General transfer comment: 2 persons for safety with line management and to steady walker, mod assist to initiate power up and steady with rising to stand, no posterior lean this session and pt able to  maitain balance once upright. verbal cues requried to improve posture.  Ambulation/Gait Ambulation/Gait assistance: Min assist;+2 safety/equipment Gait Distance (Feet): 60 Feet Assistive device: Rolling walker (2 wheeled) Gait Pattern/deviations: Shuffle;Trunk flexed;Drifts right/left;Decreased stride length;Step-through pattern Gait velocity: decreased   General Gait Details: min assist to steady with RW and verbal cues to improve posture throughout. verbal/visual cues to facilitate improved step length. 2 person assist for safety ad to manage IV pole and chair follow.   Stairs             Wheelchair Mobility    Modified Rankin (Stroke Patients Only)       Balance Overall balance assessment: Needs assistance Sitting-balance support: Bilateral upper extremity supported;Feet supported Sitting balance-Leahy Scale: Fair   Postural control: Posterior lean Standing balance support: Bilateral upper extremity supported Standing balance-Leahy Scale: Poor Standing balance comment: reliant on UEs on RW and outside assist to maintain balance         Cognition Arousal/Alertness: Awake/alert Behavior During Therapy: WFL for tasks assessed/performed Overall Cognitive Status: History of cognitive impairments - at baseline        General Comments: pt in pleasant mood today and agreeable to therapy, pt discussing his wife Derrick [name] and how long they have been married, discussing that he likes to have the bright light from the window. Pt pleasantly surprised at his mobiliy improvements remarking on his own improved ability to sit up at EOB.      Exercises      General Comments        Pertinent  Vitals/Pain Pain Assessment: No/denies pain           PT Goals (current goals can now be found in the care plan section) Acute Rehab PT Goals Patient Stated Goal: go home PT Goal Formulation: With patient Time For Goal Achievement: 10/03/19 Potential to Achieve Goals: Fair Progress  towards PT goals: Progressing toward goals    Frequency    Min 2X/week      PT Plan Frequency needs to be updated       AM-PAC PT "6 Clicks" Mobility   Outcome Measure  Help needed turning from your back to your side while in a flat bed without using bedrails?: A Little Help needed moving from lying on your back to sitting on the side of a flat bed without using bedrails?: A Little Help needed moving to and from a bed to a chair (including a wheelchair)?: A Lot Help needed standing up from a chair using your arms (e.g., wheelchair or bedside chair)?: A Lot Help needed to walk in hospital room?: A Little Help needed climbing 3-5 steps with a railing? : A Lot 6 Click Score: 15    End of Session Equipment Utilized During Treatment: Gait belt Activity Tolerance: Treatment limited secondary to agitation;Patient limited by fatigue Patient left: with restraints reapplied;in chair;with call bell/phone within reach;with chair alarm set(UE restraints, LE's left without restraints with permission from RN) Nurse Communication: Mobility status PT Visit Diagnosis: Unsteadiness on feet (R26.81);Muscle weakness (generalized) (M62.81);Difficulty in walking, not elsewhere classified (R26.2);Pain     Time: 1245-1311 PT Time Calculation (min) (ACUTE ONLY): 26 min  Charges:  $Gait Training: 8-22 mins $Therapeutic Activity: 8-22 mins                     Derrick [name] PT, DPT Physical Therapist with [facility]  09/25/2019 1:39 PM

## 2019-09-25 NOTE — Progress Notes (Addendum)
Patient ID: Derrick [name], male   DOB: 07/25/1932, 84 y.o.   MRN: [id number]       Subjective: Just had some vanilla pudding, otherwise not much else to eat today.  No complaints of abdominal pain.  Still confused  ROS: unable to due confusion  Objective: Vital signs in last 24 hours: Temp:  [98 F (36.7 C)-98.1 F (36.7 C)] 98.1 F (36.7 C) (12/18 0500) Pulse Rate:  [78-89] 85 (12/18 0500) Resp:  [11-19] 19 (12/18 0500) BP: (120-144)/(63-77) 120/70 (12/18 0500) SpO2:  [95 %-97 %] 95 % (12/18 0500) Last BM Date: (UTA)  Intake/Output from previous day: 12/17 0701 - 12/18 0700 In: 2216.3 [P.O.:240; I.V.:1746.3; IV Piggyback:220] Out: [id number] [Urine:3100; Drains:220] Intake/Output this shift: No intake/output data recorded.  PE: Abd: soft, NT, ND, +BS, perc chole drain with bilious output, abdominal binder in place  Lab Results:  Recent Labs    09/23/19 0500  WBC 13.8*  HGB 10.0*  HCT 32.6*  PLT 290   BMET Recent Labs    09/23/19 0500  NA 141  K 3.8  CL 110  CO2 22  GLUCOSE 97  BUN 11  CREATININE 0.94  CALCIUM 8.6*   PT/INR No results for input(s): LABPROT, INR in the last 72 hours. CMP     Component Value Date/Time   NA 141 09/23/2019 0500   K 3.8 09/23/2019 0500   CL 110 09/23/2019 0500   CO2 22 09/23/2019 0500   GLUCOSE 97 09/23/2019 0500   BUN 11 09/23/2019 0500   CREATININE 0.94 09/23/2019 0500   CREATININE 1.68 (H) 04/09/2016 1037   CALCIUM 8.6 (L) 09/23/2019 0500   PROT 5.7 (L) 09/23/2019 0500   ALBUMIN 2.6 (L) 09/23/2019 0500   AST 39 09/23/2019 0500   ALT 67 (H) 09/23/2019 0500   ALKPHOS 64 09/23/2019 0500   BILITOT 0.8 09/23/2019 0500   GFRNONAA >60 09/23/2019 0500   GFRAA >60 09/23/2019 0500   Lipase     Component Value Date/Time   LIPASE 24 09/16/2019 1113       Studies/Results: No results found.  Anti-infectives: Anti-infectives (From admission, onward)   Start     Dose/Rate Route Frequency Ordered Stop   09/17/19 1000   hydroxychloroquine (PLAQUENIL) tablet 200 mg  Status:  Discontinued     200 mg Oral Daily 09/16/19 2340 09/16/19 2352   09/17/19 0000  piperacillin-tazobactam (ZOSYN) IVPB 3.375 g     3.375 g 12.5 mL/hr over 240 Minutes Intravenous Every 8 hours 09/16/19 2349     09/16/19 1230  piperacillin-tazobactam (ZOSYN) IVPB 3.375 g     3.375 g 100 mL/hr over 30 Minutes Intravenous  Once 09/16/19 1227 09/16/19 1347       Assessment/Plan Atrial fibrillation Coronary artery disease Peripheral artery disease Rheumatoid arthritis History of CVA History of renal cell carcinoma, status post nephrectomy History of prostate cancer Hypertension Chronic kidney disease  Acute cholecystitis with bacteremia and sepsis -abdominal binder to help minimize pulling at tube.  -cont perc chole drain -diet as tolerates -surgically stable -abx per ID given bacteremia -patient will need to follow up with Dr. Marlou Starks in 6 weeks -will see prn over the weekend  FEN - FLD, diet as tolerates from our standpoint VTE -Plavix, Lovenox ID -Zosyn, per ID Follow up: Dr. Marlou Starks   LOS: 9 days    Henreitta Cea , [facility] Surgery 09/25/2019, 11:15 AM Please see Amion for pager number during day hours 7:00am-4:30pm

## 2019-09-26 ENCOUNTER — Inpatient Hospital Stay (HOSPITAL_COMMUNITY): Payer: Medicare Other

## 2019-09-26 HISTORY — PX: IR CATHETER TUBE CHANGE: IMG717

## 2019-09-26 LAB — CBC
HCT: 34.6 % — ABNORMAL LOW (ref 39.0–52.0)
Hemoglobin: 10.4 g/dL — ABNORMAL LOW (ref 13.0–17.0)
MCH: 25.6 pg — ABNORMAL LOW (ref 26.0–34.0)
MCHC: 30.1 g/dL (ref 30.0–36.0)
MCV: 85 fL (ref 80.0–100.0)
Platelets: 366 10*3/uL (ref 150–400)
RBC: 4.07 MIL/uL — ABNORMAL LOW (ref 4.22–5.81)
RDW: 16.6 % — ABNORMAL HIGH (ref 11.5–15.5)
WBC: 15 10*3/uL — ABNORMAL HIGH (ref 4.0–10.5)
nRBC: 0 % (ref 0.0–0.2)

## 2019-09-26 LAB — GLUCOSE, CAPILLARY
Glucose-Capillary: 89 mg/dL (ref 70–99)
Glucose-Capillary: 81 mg/dL (ref 70–99)
Glucose-Capillary: 113 mg/dL — ABNORMAL HIGH (ref 70–99)

## 2019-09-26 LAB — COMPREHENSIVE METABOLIC PANEL
ALT: 111 U/L — ABNORMAL HIGH (ref 0–44)
AST: 46 U/L — ABNORMAL HIGH (ref 15–41)
Albumin: 2.9 g/dL — ABNORMAL LOW (ref 3.5–5.0)
Alkaline Phosphatase: 59 U/L (ref 38–126)
Anion gap: 10 (ref 5–15)
BUN: 21 mg/dL (ref 8–23)
CO2: 21 mmol/L — ABNORMAL LOW (ref 22–32)
Calcium: 9 mg/dL (ref 8.9–10.3)
Chloride: 108 mmol/L (ref 98–111)
Creatinine, Ser: 1.16 mg/dL (ref 0.61–1.24)
GFR calc Af Amer: >60 mL/min (ref >60)
GFR calc non Af Amer: 58 mL/min — ABNORMAL LOW (ref >60)
Glucose, Bld: 103 mg/dL — ABNORMAL HIGH (ref 70–99)
Potassium: 4.1 mmol/L (ref 3.5–5.1)
Sodium: 139 mmol/L (ref 135–145)
Total Bilirubin: 0.8 mg/dL (ref 0.3–1.2)
Total Protein: 5.9 g/dL — ABNORMAL LOW (ref 6.5–8.1)

## 2019-09-26 MED ORDER — LIDOCAINE HCL 1 % IJ SOLN
INTRAMUSCULAR | Status: AC
  Filled 2019-09-26: qty 20

## 2019-09-26 MED ORDER — PREDNISONE 5 MG PO TABS
5.0000 mg | ORAL_TABLET | Freq: Every day | ORAL | Status: DC
  Administered 2019-09-27 – 2019-09-30 (×4): 5 mg via ORAL
  Filled 2019-09-26 (×4): qty 1

## 2019-09-26 MED ORDER — LIDOCAINE HCL 1 % IJ SOLN
INTRAMUSCULAR | Status: DC | PRN
  Administered 2019-09-26: 5 mL

## 2019-09-26 MED ORDER — INSULIN ASPART 100 UNIT/ML ~~LOC~~ SOLN
0.0000 [IU] | Freq: Three times a day (TID) | SUBCUTANEOUS | Status: DC
  Administered 2019-09-28: 2 [IU] via SUBCUTANEOUS
  Administered 2019-09-29: 1 [IU] via SUBCUTANEOUS

## 2019-09-26 MED ORDER — INSULIN ASPART 100 UNIT/ML ~~LOC~~ SOLN: 0.0000 [IU] | Freq: Every day | SUBCUTANEOUS | Status: DC

## 2019-09-26 MED ORDER — IOHEXOL 300 MG/ML  SOLN
5.0000 mL | Freq: Once | INTRAMUSCULAR | Status: AC | PRN
  Administered 2019-09-26: 5 mL

## 2019-09-26 NOTE — Progress Notes (Addendum)
[facility]                                                                              Patient Demographics  Derrick [name], is a 85 y.o. male, DOB - [birth year], ZCH:[id number]  Admit date - 09/16/2019   Admitting Physician Eugenie Filler, MD  Outpatient Primary MD for the patient is [name], MD  Outpatient specialists:   LOS - 10  days   Medical records reviewed and are as summarized below:    Chief Complaint  Patient presents with  . Abdominal Pain       Brief summary   Derrick [name] Odomis a 86 y.o.malewith medical history significant ofRA on prednisone and hydroxychloroquine, CAD and PAD, HTN. Patient presented to [facility] ED with c/o periumbilical to lower abd pain, onset 3 days ago. Associated with N/V. No change in BM. No urinary complaints. Went to PCP today and referred to ED. In ED, patient was found to be septic with WBC 17 K, temp 101.6 F, heart rate 134.  Acute transaminitis, COVID-19 negative.  CT abdomen pelvis worrisome for acute cholecystitis. Patient was admitted, general surgery was consulted  Assessment & Plan    Sepsis secondary to acute cholecystitis and E. coli bacteremia - Patient met sepsis criteria at the time of admission with fevers, leukocytosis, tachycardia, source likely due to acute cholecystitis -CT abdomen pelvis showed acute cholecystitis, patient also had transaminitis, rapidly progressive encephalopathy on admission -Blood cultures positive for E. coli.  Patient was placed on n.p.o. status, IV fluids and empiric IV antibiotics. General surgery was consulted -Status post cholecystostomy tube placed by IR on 12/11 with bilious drainage.   -Antibiotics narrowed to IV Zosyn per sensitivities -Transition from IV Solu-Cortef to oral prednisone (Patient noted to be on Plaquenil and chronic steroids for RA and steroid started due to worsening encephalopathy).  -GI was consulted on 12/11, recommended to continue current  management.  Defer to general surgery and IR, if patient does not improve with PERC drain or if LFTs continues to trend up, patient will need MRCP. -Continue current drain management per IR -Eventually will need cholecystectomy, advance diet to soft solids.  E. coli bacteremia -Likely due to acute cholecystitis -Continue IV Zosyn, will likely need 10 to 14 days antibiotics, will transition to Ceftin and Flagyl close to DC  Rheumatoid arthritis - Plaquenil and prednisone were placed on hold and patient was started on IV stress dose steroids - IV Solu-Cortef as now transitioned to oral prednisone and slowly taper back to home dose (5 mg).  Transition to oral prednisone 5 mg maintenance dose tomorrow. - Restart Plaquenil outpatient once acute infection has resolved   A. fib with RVR -Patient was noted to be in atrial fibrillation with RVR post cholecystostomy tube on the evening of 12/11 and had to be placed on Cardizem drip -Heart rate now controlled, Cardizem drip has been off, currently tolerating oral Cardizem 60 mg every 6 hours -Continue chronic Plavix for CAD/PAD but not on chronic blood thinners   Acute metabolic encephalopathy, likely has underlying dementia at baseline -Per son, has baseline mild dementia, likely worsened now with  sepsis, delirium and sundowning  -Head CT negative for acute abnormalities, showed mild chronic atrophy and chronic ischemic changes. Olanzapine was discontinued on 12/17, was placed on Seroquel 25 mg.   -Patient has been doing well on Seroquel 25 mg daily at 8 PM, alert and oriented today  Coronary disease/PAD -Currently no chest pain or shortness of breath, stable -Continue home regimen of aspirin, Lipitor, Plavix  -Outpatient follow-up with cardiology post discharge for preop clearance for possible cholecystectomy in about 6 to 8 weeks  Essential hypertension Continue Cardizem  GERD Continue PPI  QT prolongation -EKG on 12/15 showed sinus  rhythm, QTC 477 -Avoid haldol, keep potassium> 4, magnesium above  Code Status: Full CODE STATUS DVT Prophylaxis:  Lovenox Family Communication: Discussed all imaging results, lab results, explained to the patient's son, [name] on the phone yesterday.     Disposition Plan: Patient now much more alert and oriented, following commands, will reevaluate with PT if still needs skilled nursing facility  Time Spent in minutes 25 minutes  Procedures:  Cholecystostomy tube  Consultants:   Interventional radiology GI Surgery  Antimicrobials:   Anti-infectives (From admission, onward)   Start     Dose/Rate Route Frequency Ordered Stop   09/17/19 1000  hydroxychloroquine (PLAQUENIL) tablet 200 mg  Status:  Discontinued     200 mg Oral Daily 09/16/19 2340 09/16/19 2352   09/17/19 0000  piperacillin-tazobactam (ZOSYN) IVPB 3.375 g     3.375 g 12.5 mL/hr over 240 Minutes Intravenous Every 8 hours 09/16/19 2349     09/16/19 1230  piperacillin-tazobactam (ZOSYN) IVPB 3.375 g     3.375 g 100 mL/hr over 30 Minutes Intravenous  Once 09/16/19 1227 09/16/19 1347         Medications  Scheduled Meds: . aspirin  81 mg Oral Daily  . atorvastatin  40 mg Oral Daily  . Chlorhexidine Gluconate Cloth  6 each Topical Daily  . clopidogrel  75 mg Oral Daily  . diltiazem  60 mg Oral Q6H  . docusate sodium  250 mg Oral Daily  . enoxaparin (LOVENOX) injection  40 mg Subcutaneous Daily  . feeding supplement (ENSURE ENLIVE)  237 mL Oral BID BM  . feeding supplement (PRO-STAT SUGAR FREE 64)  30 mL Oral BID  . finasteride  5 mg Oral QPM  . insulin aspart  0-5 Units Subcutaneous QHS  . insulin aspart  0-9 Units Subcutaneous TID WC  . mouth rinse  15 mL Mouth Rinse BID  . multivitamin with minerals  1 tablet Oral Daily  . omega-3 acid ethyl esters  1 g Oral Daily  . pantoprazole  40 mg Oral BID  . QUEtiapine  25 mg Oral Q24H  . sertraline  25 mg Oral Daily  . sodium chloride flush  5 mL Intracatheter  Q8H   Continuous Infusions: . sodium chloride Stopped (09/22/19 2342)  . piperacillin-tazobactam (ZOSYN)  IV 3.375 g (09/26/19 0838)   PRN Meds:.sodium chloride, acetaminophen **OR** acetaminophen, labetalol, morphine injection, ondansetron **OR** ondansetron (ZOFRAN) IV, sodium chloride flush      Subjective:   Rashid Whitenight was seen and examined today.  Alert and oriented, no acute complaints.  Slept well overnight.  Tolerating full liquid diet.  No active nausea vomiting or fevers.  No acute issues overnight.  Objective:   Vitals:   09/25/19 1841 09/25/19 2108 09/26/19 0429 09/26/19 0429  BP: (!) 123/103 (!) 153/88 (!) 174/95   Pulse: 88 88 (!) 114 72  Resp:  18 18  Temp:  97.8 F (36.6 C) 98.6 F (37 C)   TempSrc:  Oral    SpO2:  99% 99% 98%  Weight:      Height:        Intake/Output Summary (Last 24 hours) at 09/26/2019 1331 Last data filed at 09/26/2019 1108 Gross per 24 hour  Intake 340 ml  Output 1600 ml  Net -1260 ml     Wt Readings from Last 3 Encounters:  09/23/19 79.4 kg  07/07/19 83.5 kg  07/04/19 74.8 kg   Physical Exam  General: Alert and oriented, NAD, mental status much improved  Eyes: ,  HEENT:  Atraumatic, normocephalic  Cardiovascular: S1 S2 clear,  RRR. No pedal edema b/l  Respiratory: CTAB, no wheezing, rales or rhonchi  Gastrointestinal: Soft, nontender, nondistended, NBS, cholecystostomy tube+  Ext: no pedal edema bilaterally  Neuro: no new deficits  Musculoskeletal: No cyanosis, clubbing  Skin: No rashes  Psych: Alert and oriented   Data Reviewed:  I have personally reviewed following labs and imaging studies  Micro Results Recent Results (from the past 240 hour(s))  MRSA PCR Screening     Status: None   Collection Time: 09/16/19 11:34 PM   Specimen: Nasal Mucosa; Nasopharyngeal  Result Value Ref Range Status   MRSA by PCR NEGATIVE NEGATIVE Final    Comment:        The GeneXpert MRSA Assay (FDA approved for NASAL  specimens only), is one component of a comprehensive MRSA colonization surveillance program. It is not intended to diagnose MRSA infection nor to guide or monitor treatment for MRSA infections. Performed at [facility], [place] [address] [place], [place] [address]     Radiology Reports CT HEAD WO CONTRAST  Result Date: 09/17/2019 CLINICAL DATA:  Encephalopathy. EXAM: CT HEAD WITHOUT CONTRAST TECHNIQUE: Contiguous axial images were obtained from the base of the skull through the vertex without intravenous contrast. COMPARISON:  October 02, 2012. FINDINGS: Brain: Mild diffuse cortical atrophy is noted. Mild chronic ischemic white matter disease is noted. No mass effect or midline shift is noted. Ventricular size is within normal limits. There is no evidence of mass lesion, hemorrhage or acute infarction. Vascular: No hyperdense vessel or unexpected calcification. Skull: Normal. Negative for fracture or focal lesion. Sinuses/Orbits: No acute finding. Other: None. IMPRESSION: Mild diffuse cortical atrophy. Mild chronic ischemic white matter disease. No acute intracranial abnormality seen. Electronically Signed   By: Marijo Conception M.D.   On: 09/17/2019 11:46   CT ABDOMEN PELVIS W CONTRAST  Result Date: 09/16/2019 CLINICAL DATA:  Acute abdominal pain, generalized. EXAM: CT ABDOMEN AND PELVIS WITH CONTRAST TECHNIQUE: Multidetector CT imaging of the abdomen and pelvis was performed using the standard protocol following bolus administration of intravenous contrast. CONTRAST:  153m OMNIPAQUE IOHEXOL 300 MG/ML  SOLN COMPARISON:  10/03/2018 FINDINGS: Lower chest: Small subpleural lymph node in the right lower lobe stable since 2016. Signs of scarring and mild subpleural reticulation, unchanged. No signs of consolidation or evidence of pleural effusion. Hepatobiliary: Liver is unremarkable. There is moderate distension of the gallbladder with signs of gallbladder wall thickening and  pericholecystic stranding. Wall thickness difficult to read on CT but is much as 4 mm despite the distended appearance of the gallbladder. There is no intra or extrahepatic biliary ductal distension. Pancreas: Unremarkable. No pancreatic ductal dilatation or surrounding inflammatory changes. Spleen: Normal in size without focal abnormality. Adrenals/Urinary Tract: Signs of right nephrectomy with stable small area of soft tissue nodularity along the ligated  right renal artery measuring approximately 1.5 cm this has been present since 2007. Adrenal glands are normal. Small presumed cysts in the left kidney are unchanged. Stomach/Bowel: No signs of acute gastrointestinal process. Normal appendix. Vascular/Lymphatic: Calcified atherosclerotic changes of the abdominal aorta up to 3 x 2.7 cm in greatest axial dimension. No signs of adenopathy in the upper abdomen or retroperitoneum. No signs of pelvic adenopathy. Vascular disease extends into branch vessels throughout the abdomen and pelvis. Reproductive: Prostatomegaly. No sign of pelvic adenopathy. Other: No signs of free air. No abscess. Musculoskeletal: Spinal degenerative changes with signs of lower lumbar laminectomy. Grade 1 anterolisthesis of L5 on S1. Finding similar to prior study. Also mild anterolisthesis of L4 on L5. IMPRESSION: 1. CT findings of acute cholecystitis. 2. Signs of right nephrectomy with stable small area of soft tissue nodularity along the ligated right renal artery. Unchanged since 2007. 3. Aortic atherosclerosis. 4. Prostatomegaly. Aortic Atherosclerosis (ICD10-I70.0). Electronically Signed   By: Zetta Bills M.D.   On: 09/16/2019 12:39   NM Hepato W/EjeCT Fract  Result Date: 09/17/2019 CLINICAL DATA:  RIGHT upper quadrant pain with nausea and vomiting for 3 days EXAM: NUCLEAR MEDICINE HEPATOBILIARY IMAGING TECHNIQUE: Sequential images of the abdomen were obtained out to 60 minutes following intravenous administration of  radiopharmaceutical. RADIOPHARMACEUTICALS:  5.35 mCi Tc-711m Choletec IV Pharmaceutical: Morphine sulfate 3 mg IV COMPARISON:  Ultrasound abdomen 09/16/2019 FINDINGS: Normal tracer extraction from bloodstream indicating normal hepatocellular function. Normal excretion of tracer into biliary tree. Small bowel visualized at 14 minutes. At 1 hour, gallbladder had not visualized. Patient then received morphine and imaging was continued for an additional 30 minutes. Gallbladder failed to visualize following morphine augmentation. Findings are consistent with cystic duct obstruction and acute cholecystitis. IMPRESSION: Patent CBD. Nonvisualization of the gallbladder despite morphine augmentation consistent with cystic duct obstruction and acute cholecystitis. Electronically Signed   By: MLavonia DanaM.D.   On: 09/17/2019 14:36   UKoreaAbdomen Limited  Result Date: 09/16/2019 CLINICAL DATA:  Abdominal pain. CT abdomen and pelvis today demonstrated findings worrisome for cholecystitis. EXAM: ULTRASOUND ABDOMEN LIMITED RIGHT UPPER QUADRANT COMPARISON:  CT abdomen and pelvis earlier today. FINDINGS: Gallbladder: No gallstones or pericholecystic fluid. Sonographer reports negative Murphy's sign. The gallbladder is mildly distended and there is minimal wall thickening at 0.4 cm. Common bile duct: Diameter: 0.8 cm Liver: No focal lesion. Echogenicity is increased. Portal vein is patent on color Doppler imaging with normal direction of blood flow towards the liver. Other: None. IMPRESSION: Negative for gallstones or acute cholecystitis. Fatty infiltration of the liver. Electronically Signed   By: TInge RiseM.D.   On: 09/16/2019 13:59   IR Perc Cholecystostomy  Result Date: 09/18/2019 CLINICAL DATA:  Acute cholecystitis, currently poor operative candidate EXAM: PERCUTANEOUS CHOLECYSTOSTOMY TUBE PLACEMENT WITH ULTRASOUND AND FLUOROSCOPIC GUIDANCE FLUOROSCOPY TIME:  0.5 minutes; 197 uGym2 DAP TECHNIQUE: The procedure,  risks (including but not limited to bleeding, infection, organ damage ), benefits, and alternatives were explained to the patient and family. Questions regarding the procedure were encouraged and answered. The family understands and consents to the procedure. Survey ultrasound of the abdomen was performed and an appropriate skin entry site was identified. Skin site was marked, prepped with Betadine, and draped in usual sterile fashion, and infiltrated locally with 1% lidocaine. Intravenous Fentanyl 1018m and Versed 11m68mere administered as conscious sedation during continuous monitoring of the patient's level of consciousness and physiological / cardiorespiratory status by the radiology RN, with a total moderate sedation  time of 10 minutes. Under real-time ultrasound guidance, gallbladder was accessed using a transhepatic approach with a 21-gauge needle. Ultrasound image documentation was saved. Bile returned through the hub. Needle was exchanged over a 018 guidewire for transitional dilator which allowed placement of 035 J wire. Over this, a 10.2 French pigtail catheter was advanced and formed centrally in the gallbladder lumen. Small contrast injection confirmed appropriate position. Catheter secured externally with 0 Prolene suture and placed external drain bag. Patient tolerated the procedure well. COMPLICATIONS: COMPLICATIONS none IMPRESSION: 1. Technically successful percutaneous cholecystostomy tube placement with ultrasound and fluoroscopic guidance. Electronically Signed   By: Lucrezia Europe M.D.   On: 09/18/2019 14:56   DG Chest Portable 1 View  Result Date: 09/16/2019 CLINICAL DATA:  Fever, dyspnea. EXAM: PORTABLE CHEST 1 VIEW COMPARISON:  Chest radiograph 12/12/2018 FINDINGS: The patient is significantly rotated to the right, limiting evaluation. Heart size within normal limits. No definite airspace consolidation within the lungs. No evidence of pleural effusion or pneumothorax. No acute bony  abnormality. Overlying cardiac monitoring leads. IMPRESSION: Patient significantly rotated to the right, limiting evaluation. No definite airspace consolidation. Electronically Signed   By: Kellie Simmering DO   On: 09/16/2019 15:16   DG Abd Portable 1V  Result Date: 09/20/2019 CLINICAL DATA:  Biliary drainage tube. EXAM: PORTABLE ABDOMEN - 1 VIEW COMPARISON:  September 18, 2019. FINDINGS: The bowel gas pattern is normal. Percutaneous drainage catheter is noted in right upper quadrant. No radio-opaque calculi or other significant radiographic abnormality are seen. IMPRESSION: Percutaneous drainage catheter is noted in right upper quadrant. Whether or not catheter tip is positioned within the gallbladder lumen cannot be determined by this radiograph. Electronically Signed   By: Marijo Conception M.D.   On: 09/20/2019 11:17   ECHOCARDIOGRAM COMPLETE  Result Date: 09/19/2019   ECHOCARDIOGRAM REPORT   Patient Name:   YAACOV KOZIOL Date of Exam: 09/19/2019 Medical Rec #:  [id number]   Height:       72.0 in Accession #:    [id number]  Weight:       178.1 lb Date of Birth:  07-15-1932  BSA:          2.03 m Patient Age:    78 years    BP:           137/69 mmHg Patient Gender: M           HR:           94 bpm. Exam Location:  Inpatient Procedure: 2D Echo Indications:    Atrial Fibrillation 427.31 / I48.91  History:        Patient has prior history of Echocardiogram examinations, most                 recent 12/12/2018. CAD; Risk Factors:Hypertension. AKI                 Sepsis.  Sonographer:    Vikki Ports Turrentine Referring Phys: [id number] DANIEL V THOMPSON  Sonographer Comments: Suboptimal subcostal window due to patient body habitus. IMPRESSIONS  1. Left ventricular ejection fraction, by visual estimation, is 55 to 60%. The left ventricle has normal function. There is mildly increased left ventricular hypertrophy.  2. Left ventricular diastolic parameters are indeterminate.  3. The left ventricle has no regional wall motion  abnormalities.  4. Global right ventricle has normal systolic function.The right ventricular size is normal. No increase in right ventricular wall thickness.  5. Left atrial size was moderately dilated.  6.  Right atrial size was normal.  7. The mitral valve is normal in structure. Mild to moderate mitral valve regurgitation.  8. Very eccentric anterior directed MR jet. The degree of MR may be underestimated due to the eccentrity of the jet. The MV to AV VTI ratio is 0.6 suggesting mild MR.  9. The tricuspid valve is normal in structure. Tricuspid valve regurgitation is trivial. 10. The aortic valve is tricuspid. Aortic valve regurgitation is not visualized. No evidence of aortic valve sclerosis or stenosis. 11. Pulmonic regurgitation is mild. 12. The pulmonic valve was not well visualized. Pulmonic valve regurgitation is mild. 13. The interatrial septum was not well visualized. FINDINGS  Left Ventricle: Left ventricular ejection fraction, by visual estimation, is 55 to 60%. The left ventricle has normal function. The left ventricle has no regional wall motion abnormalities. There is mildly increased left ventricular hypertrophy. Left ventricular diastolic parameters are indeterminate. Right Ventricle: The right ventricular size is normal. No increase in right ventricular wall thickness. Global RV systolic function is has normal systolic function. Left Atrium: Left atrial size was moderately dilated. Right Atrium: Right atrial size was normal in size Pericardium: There is no evidence of pericardial effusion. Mitral Valve: The mitral valve is normal in structure. Mild to moderate mitral valve regurgitation. Very eccentric anterior directed MR jet. The degree of MR may be underestimated due to the eccentrity of the jet. The MV to AV VTI ratio is 0.6 suggesting  mild MR. Tricuspid Valve: The tricuspid valve is normal in structure. Tricuspid valve regurgitation is trivial. Aortic Valve: The aortic valve is tricuspid.  Aortic valve regurgitation is not visualized. The aortic valve is structurally normal, with no evidence of sclerosis or stenosis. Aortic valve mean gradient measures 7.3 mmHg. Aortic valve peak gradient measures 13.7 mmHg. Aortic valve area, by VTI measures 1.77 cm. Pulmonic Valve: The pulmonic valve was not well visualized. Pulmonic valve regurgitation is mild. Pulmonic regurgitation is mild. No evidence of pulmonic stenosis. Aorta: The aortic root is normal in size and structure. Pulmonary Artery: Indeterminate PASP, inadequate TR jet. Venous: The inferior vena cava was not well visualized. IAS/Shunts: The interatrial septum was not well visualized.  LEFT VENTRICLE PLAX 2D LVIDd:         4.50 cm LVIDs:         3.30 cm LV PW:         1.20 cm LV IVS:        1.20 cm LVOT diam:     2.10 cm LV SV:         48 ml LV SV Index:   23.80 LVOT Area:     3.46 cm  RIGHT VENTRICLE RV S prime:     16.00 cm/s TAPSE (M-mode): 1.6 cm LEFT ATRIUM              Index       RIGHT ATRIUM           Index LA diam:        4.60 cm  2.27 cm/m  RA Area:     13.90 cm LA Vol (A2C):   103.0 ml 50.79 ml/m RA Volume:   27.40 ml  13.51 ml/m LA Vol (A4C):   52.3 ml  25.79 ml/m LA Biplane Vol: 78.9 ml  38.90 ml/m  AORTIC VALVE AV Area (Vmax):    1.72 cm AV Area (Vmean):   1.81 cm AV Area (VTI):     1.77 cm AV Vmax:  185.22 cm/s AV Vmean:          124.794 cm/s AV VTI:            0.352 m AV Peak Grad:      13.7 mmHg AV Mean Grad:      7.3 mmHg LVOT Vmax:         92.03 cm/s LVOT Vmean:        65.200 cm/s LVOT VTI:          0.180 m LVOT/AV VTI ratio: 0.51  AORTA Ao Root diam: 3.40 cm MITRAL VALVE                        TRICUSPID VALVE MV Area (PHT): 3.91 cm             TR Peak grad:   13.4 mmHg MV VTI:        0.21 m               TR Vmax:        183.00 cm/s MV PHT:        56.26 msec MV Decel Time: 194 msec             SHUNTS MV E velocity: 84.27 cm/s 103 cm/s  Systemic VTI:  0.18 m MV A velocity: 79.70 cm/s 70.3 cm/s Systemic Diam:  2.10 cm MV E/A ratio:  1.06       1.5  Carlyle Dolly MD Electronically signed by Carlyle Dolly MD Signature Date/Time: 09/19/2019/12:26:36 PM    Final     Lab Data:  CBC: Recent Labs  Lab 09/20/19 0214 09/21/19 0214 09/22/19 0638 09/23/19 0500 09/26/19 0420  WBC 8.6 9.6 11.6* 13.8* 15.0*  NEUTROABS 7.6 6.7  --  10.8*  --   HGB 8.8* 7.9* 10.2* 10.0* 10.4*  HCT 27.7* 24.6* 32.6* 32.6* 34.6*  MCV 83.9 80.9 84.2 84.2 85.0  PLT 208 199 261 290 336   Basic Metabolic Panel: Recent Labs  Lab 09/20/19 0214 09/21/19 0214 09/21/19 1555 09/22/19 0500 09/22/19 0638 09/23/19 0500 09/26/19 0420  NA 138 142  --  139 140 141 139  K 3.1* 2.8* 4.1 4.5 3.3* 3.8 4.1  CL 105 108  --  106 108 110 108  CO2 24 25  --  '23 24 22 ' 21*  GLUCOSE 130* 88  --  114* 108* 97 103*  BUN 16 11  --  '12 11 11 21  ' CREATININE 0.83 0.86  --  0.96 0.87 0.94 1.16  CALCIUM 8.5* 8.5*  --  8.3* 8.5* 8.6* 9.0  MG 2.1  --   --  2.3 2.1 1.9  --    GFR: Estimated Creatinine Clearance: 52 mL/min (by C-G formula based on SCr of 1.16 mg/dL). Liver Function Tests: Recent Labs  Lab 09/21/19 0214 09/22/19 0500 09/22/19 0638 09/23/19 0500 09/26/19 0420  AST 30 57* 29 39 46*  ALT 51* 53* 54* 67* 111*  ALKPHOS 65 67 64 64 59  BILITOT 1.0 2.1* 1.3* 0.8 0.8  PROT 5.4* 6.2* 6.0* 5.7* 5.9*  ALBUMIN 2.4* 2.8* 2.8* 2.6* 2.9*   No results for input(s): LIPASE, AMYLASE in the last 168 hours. No results for input(s): AMMONIA in the last 168 hours. Coagulation Profile: No results for input(s): INR, PROTIME in the last 168 hours. Cardiac Enzymes: No results for input(s): CKTOTAL, CKMB, CKMBINDEX, TROPONINI in the last 168 hours. BNP (last 3 results) No results for input(s): PROBNP in the last 8760  hours. HbA1C: No results for input(s): HGBA1C in the last 72 hours. CBG: Recent Labs  Lab 09/25/19 1633 09/25/19 2005 09/25/19 2350 09/26/19 0439 09/26/19 0754  GLUCAP 115* 144* 102* 89 81   Lipid Profile: No  results for input(s): CHOL, HDL, LDLCALC, TRIG, CHOLHDL, LDLDIRECT in the last 72 hours. Thyroid Function Tests: No results for input(s): TSH, T4TOTAL, FREET4, T3FREE, THYROIDAB in the last 72 hours. Anemia Panel: No results for input(s): VITAMINB12, FOLATE, FERRITIN, TIBC, IRON, RETICCTPCT in the last 72 hours. Urine analysis:    Component Value Date/Time   COLORURINE YELLOW 09/16/2019 1455   APPEARANCEUR CLEAR 09/16/2019 1455   LABSPEC 1.010 09/16/2019 1455   PHURINE 7.0 09/16/2019 1455   GLUCOSEU NEGATIVE 09/16/2019 1455   HGBUR SMALL (A) 09/16/2019 1455   BILIRUBINUR NEGATIVE 09/16/2019 1455   KETONESUR NEGATIVE 09/16/2019 1455   PROTEINUR 100 (A) 09/16/2019 1455   UROBILINOGEN 1.0 11/27/2013 0947   NITRITE NEGATIVE 09/16/2019 1455   LEUKOCYTESUR NEGATIVE 09/16/2019 1455     Bernedette Auston M.D. [facility] 09/26/2019, 1:31 PM   Call night coverage person covering after 7pm

## 2019-09-26 NOTE — Progress Notes (Signed)
Pt pleasant t/o shift, slept intermittently after midnight. Dressing changed to drain, emptied for 100 ml of green bile. Pt pulled out left IV.

## 2019-09-26 NOTE — Progress Notes (Signed)
Pt out of restraints at beginning of shift, day shift RN took him out of them, pt is being appropriate at this time,

## 2019-09-26 NOTE — Progress Notes (Signed)
Patient found with blood spot soaking through gown.  When gown pulled back patient had visible clots and blood coming from percutaneous chole tube.  Patient cleaned up and pressure placed over area to stop the bleeding.  Drain had sutures dangling from it and was out of body approximately 6 inches.  Bright red blood was visible in tubing.  Gauze dressing place over site and tubing taped up patient's side.  Dr. Tana Coast and Dr. Myrle Sheng notified.  Somebody from IR to come evaluate.

## 2019-09-27 DIAGNOSIS — I480 Paroxysmal atrial fibrillation: Secondary | ICD-10-CM

## 2019-09-27 LAB — CBC
HCT: 31.3 % — ABNORMAL LOW (ref 39.0–52.0)
Hemoglobin: 9.6 g/dL — ABNORMAL LOW (ref 13.0–17.0)
MCH: 26.1 pg (ref 26.0–34.0)
MCHC: 30.7 g/dL (ref 30.0–36.0)
MCV: 85.1 fL (ref 80.0–100.0)
Platelets: 306 10*3/uL (ref 150–400)
RBC: 3.68 MIL/uL — ABNORMAL LOW (ref 4.22–5.81)
RDW: 16.8 % — ABNORMAL HIGH (ref 11.5–15.5)
WBC: 13.9 10*3/uL — ABNORMAL HIGH (ref 4.0–10.5)
nRBC: 0 % (ref 0.0–0.2)

## 2019-09-27 LAB — COMPREHENSIVE METABOLIC PANEL
ALT: 99 U/L — ABNORMAL HIGH (ref 0–44)
AST: 40 U/L (ref 15–41)
Albumin: 2.5 g/dL — ABNORMAL LOW (ref 3.5–5.0)
Alkaline Phosphatase: 81 U/L (ref 38–126)
Anion gap: 7 (ref 5–15)
BUN: 26 mg/dL — ABNORMAL HIGH (ref 8–23)
CO2: 25 mmol/L (ref 22–32)
Calcium: 8.8 mg/dL — ABNORMAL LOW (ref 8.9–10.3)
Chloride: 107 mmol/L (ref 98–111)
Creatinine, Ser: 1.3 mg/dL — ABNORMAL HIGH (ref 0.61–1.24)
GFR calc Af Amer: 58 mL/min — ABNORMAL LOW (ref >60)
GFR calc non Af Amer: 50 mL/min — ABNORMAL LOW (ref >60)
Glucose, Bld: 116 mg/dL — ABNORMAL HIGH (ref 70–99)
Potassium: 4 mmol/L (ref 3.5–5.1)
Sodium: 139 mmol/L (ref 135–145)
Total Bilirubin: 0.7 mg/dL (ref 0.3–1.2)
Total Protein: 5.3 g/dL — ABNORMAL LOW (ref 6.5–8.1)

## 2019-09-27 LAB — GLUCOSE, CAPILLARY
Glucose-Capillary: 105 mg/dL — ABNORMAL HIGH (ref 70–99)
Glucose-Capillary: 109 mg/dL — ABNORMAL HIGH (ref 70–99)
Glucose-Capillary: 117 mg/dL — ABNORMAL HIGH (ref 70–99)
Glucose-Capillary: 119 mg/dL — ABNORMAL HIGH (ref 70–99)
Glucose-Capillary: 129 mg/dL — ABNORMAL HIGH (ref 70–99)

## 2019-09-27 NOTE — Progress Notes (Signed)
Physical Therapy Treatment Patient Details Name: Derrick [name] MRN: [id number] DOB: [birth year] Today's Date: 09/27/2019    History of Present Illness Pt admitted through ED 09/16/19 with abdominal pain.  Pt dx with sesis 2* ecoli bacteremia and acute cholecystitis.  Pt with hx of RA, memory loss, CKD, CAD, lumbar lami and R transmetatarsal amputation (18)    PT Comments    Pt continues to require +2 assistance for mobility. He participated well with therapy. He is weak. Continue to recommend SNF.    Follow Up Recommendations  SNF     Equipment Recommendations  None recommended by PT    Recommendations for Other Services       Precautions / Restrictions Precautions Precautions: Fall Precaution Comments: drain R abdomen Restrictions Weight Bearing Restrictions: No    Mobility  Bed Mobility Overal bed mobility: Needs Assistance Bed Mobility: Sidelying to Sit   Sidelying to sit: Min assist;+2 for safety/equipment       General bed mobility comments: Assist for trunk. Increased time.  Transfers Overall transfer level: Needs assistance Equipment used: Rolling walker (2 wheeled) Transfers: Sit to/from Stand Sit to Stand: Mod assist;+2 physical assistance;From elevated surface;+2 safety/equipment         General transfer comment: Assist to power up, control descent. Poor quad strength (concentrically and eccentrically). VCs safety, technique, hand placement.  Ambulation/Gait Ambulation/Gait assistance: Min assist;+2 safety/equipment Gait Distance (Feet): 50 Feet Assistive device: Rolling walker (2 wheeled) Gait Pattern/deviations: Trunk flexed;Decreased stride length;Step-through pattern     General Gait Details: Assist to stabilize/support pt throughout ambulation distance. Cues for safety, proper distance from RW. Followed with recliner.   Stairs             Wheelchair Mobility    Modified Rankin (Stroke Patients Only)       Balance Overall  balance assessment: Needs assistance         Standing balance support: Bilateral upper extremity supported Standing balance-Leahy Scale: Poor                              Cognition Arousal/Alertness: Awake/alert Behavior During Therapy: WFL for tasks assessed/performed Overall Cognitive Status: History of cognitive impairments - at baseline                                        Exercises      General Comments        Pertinent Vitals/Pain Pain Assessment: Faces Faces Pain Scale: No hurt    Home Living                      Prior Function            PT Goals (current goals can now be found in the care plan section) Progress towards PT goals: Progressing toward goals    Frequency    Min 2X/week      PT Plan Current plan remains appropriate    Co-evaluation              AM-PAC PT "6 Clicks" Mobility   Outcome Measure  Help needed turning from your back to your side while in a flat bed without using bedrails?: A Little Help needed moving from lying on your back to sitting on the side of a flat bed without using bedrails?: A Little Help needed moving  to and from a bed to a chair (including a wheelchair)?: A Lot Help needed standing up from a chair using your arms (e.g., wheelchair or bedside chair)?: A Lot Help needed to walk in hospital room?: A Little Help needed climbing 3-5 steps with a railing? : A Lot 6 Click Score: 15    End of Session Equipment Utilized During Treatment: Gait belt Activity Tolerance: Patient tolerated treatment well Patient left: in chair;with call bell/phone within reach;with chair alarm set   PT Visit Diagnosis: Unsteadiness on feet (R26.81);Muscle weakness (generalized) (M62.81);Difficulty in walking, not elsewhere classified (R26.2);Pain     Time: [id number] PT Time Calculation (min) (ACUTE ONLY): 28 min  Charges:  $Gait Training: 8-22 mins $Therapeutic Activity: 8-22 mins                         Doreatha Massed, PT Acute Rehabilitation Services

## 2019-09-27 NOTE — Progress Notes (Addendum)
[facility]                                                                              Patient Demographics  Derrick [name], is a 85 y.o. male, DOB - 11-12-1931, ZOX:[id number]  Admit date - 09/16/2019   Admitting Physician Eugenie Filler, MD  Outpatient Primary MD for the patient is [name], MD  Outpatient specialists:   LOS - 11  days   Medical records reviewed and are as summarized below:    Chief Complaint  Patient presents with  . Abdominal Pain       Brief summary   Derrick [name] a 86 y.o.malewith medical history significant ofRA on prednisone and hydroxychloroquine, CAD and PAD, HTN. Patient presented to [facility] ED with c/o periumbilical to lower abd pain, onset 3 days ago. Associated with N/V. No change in BM. No urinary complaints. Went to PCP today and referred to ED. In ED, patient was found to be septic with WBC 17 K, temp 101.6 F, heart rate 134.  Acute transaminitis, COVID-19 negative.  CT abdomen pelvis worrisome for acute cholecystitis. Patient was admitted, general surgery was consulted  Status post cholecystostomy tube placed by IR on 12/11 with bilious drainage.  Per surgery, follow-up with Dr. Marlou Starks in 6 weeks and will need cholecystectomy.  Hospitalization complicated with delirium superimposed on the likely underlying dementia.  Will need skilled nursing facility at discharge.  Assessment & Plan    Sepsis secondary to acute cholecystitis and E. coli bacteremia - Patient met sepsis criteria at the time of admission with fevers, leukocytosis, tachycardia, source likely due to acute cholecystitis.  -CT abdomen pelvis showed acute cholecystitis, patient also had transaminitis, rapidly progressive encephalopathy on admission -Blood cultures were positive for E. coli.  Patient was placed on n.p.o. status, IV fluids and empiric IV antibiotics. General surgery was consulted -Patient underwent cholecystostomy tube placement by  interventional radiology on 12/11 with bilious drainage.   -Continue IV Zosyn per sensitivities, will likely need 10 to 14 days of antibiotics will transition to Ceftin and Flagyl close to discharge. -Patient has been transitioned from IV Solu-Cortef to oral prednisone, now on maintenance dose. (Patient noted to be on Plaquenil and chronic steroids for RA)  -GI was consulted on 12/11, recommended to continue current management.  Defer to general surgery and IR, if patient does not improve with PERC drain or if LFTs continues to trend up, patient will need MRCP. -Surgery following, will need outpatient follow-up with Dr. Marlou Starks in 6 weeks and then cholecystectomy  - place abdominal binder to help minimize pulling the tube, pulled Cole tube out on 12/19, replaced back by IR  E. coli bacteremia -Likely due to acute cholecystitis -Continue IV Zosyn, will likely need 10 to 14 days antibiotics, will transition to Ceftin and Flagyl close to DC  Rheumatoid arthritis - Plaquenil and prednisone were placed on hold and patient was started on IV stress dose steroids - IV Solu-Cortef as now transitioned to oral prednisone and tapered back to home dose 5 mg daily.  - Restart Plaquenil outpatient once acute infection has resolved   A. fib with  RVR, paroxysmal -Patient was noted to be in atrial fibrillation with RVR post cholecystostomy tube on the evening of 12/11 and had to be placed on Cardizem drip -Heart rate now controlled, Cardizem drip has been off, currently tolerating oral Cardizem 60 mg every 6 hours -Continue Plavix for CAD/PAD but not on chronic blood thinners   Acute metabolic encephalopathy, likely has underlying dementia at baseline -Per son, has baseline mild dementia, likely worsened now with sepsis, delirium and sundowning  -Head CT negative for acute abnormalities, showed mild chronic atrophy and chronic ischemic changes. Olanzapine was discontinued on 12/17, was placed on Seroquel 25 mg.    -Patient has been doing well on Seroquel 25 mg, 8 PM daily.  Coronary disease/PAD -Currently no chest pain or shortness of breath, stable -Continue home regimen of aspirin, Lipitor, Plavix  -Outpatient follow-up with cardiology post discharge for preop clearance for possible cholecystectomy in about 6 to 8 weeks  Essential hypertension Continue Cardizem  GERD Continue PPI  QT prolongation -EKG on 12/15 showed sinus rhythm, QTC 477 -Avoid haldol, keep potassium> 4, magnesium above  Code Status: Full CODE STATUS DVT Prophylaxis:  Lovenox Family Communication: Discussed all imaging results, lab results, explained to the patient's son, [name] on the phone  Patient family requesting skilled nursing facility for rehab, social work aware   Disposition Plan: Skilled nursing facility.  Time Spent in minutes 25 minutes  Procedures:  Cholecystostomy tube  Consultants:   Interventional radiology GI Surgery  Antimicrobials:   Anti-infectives (From admission, onward)   Start     Dose/Rate Route Frequency Ordered Stop   09/17/19 1000  hydroxychloroquine (PLAQUENIL) tablet 200 mg  Status:  Discontinued     200 mg Oral Daily 09/16/19 2340 09/16/19 2352   09/17/19 0000  piperacillin-tazobactam (ZOSYN) IVPB 3.375 g     3.375 g 12.5 mL/hr over 240 Minutes Intravenous Every 8 hours 09/16/19 2349     09/16/19 1230  piperacillin-tazobactam (ZOSYN) IVPB 3.375 g     3.375 g 100 mL/hr over 30 Minutes Intravenous  Once 09/16/19 1227 09/16/19 1347         Medications  Scheduled Meds: . aspirin  81 mg Oral Daily  . atorvastatin  40 mg Oral Daily  . Chlorhexidine Gluconate Cloth  6 each Topical Daily  . clopidogrel  75 mg Oral Daily  . diltiazem  60 mg Oral Q6H  . docusate sodium  250 mg Oral Daily  . enoxaparin (LOVENOX) injection  40 mg Subcutaneous Daily  . feeding supplement (ENSURE ENLIVE)  237 mL Oral BID BM  . feeding supplement (PRO-STAT SUGAR FREE 64)  30 mL Oral BID   . finasteride  5 mg Oral QPM  . insulin aspart  0-5 Units Subcutaneous QHS  . insulin aspart  0-9 Units Subcutaneous TID WC  . mouth rinse  15 mL Mouth Rinse BID  . multivitamin with minerals  1 tablet Oral Daily  . omega-3 acid ethyl esters  1 g Oral Daily  . pantoprazole  40 mg Oral BID  . predniSONE  5 mg Oral Q breakfast  . QUEtiapine  25 mg Oral Q24H  . sertraline  25 mg Oral Daily  . sodium chloride flush  5 mL Intracatheter Q8H   Continuous Infusions: . sodium chloride Stopped (09/22/19 2342)  . piperacillin-tazobactam (ZOSYN)  IV 3.375 g (09/27/19 0929)   PRN Meds:.sodium chloride, acetaminophen **OR** acetaminophen, labetalol, lidocaine, morphine injection, ondansetron **OR** ondansetron (ZOFRAN) IV, sodium chloride flush  Subjective:   Derrick [name] was seen and examined today.  Yesterday pulled the perc chole tube, was noticed to be bleeding, tube now replaced.  No acute complaints, sleepy at the time of my examination, had received morphine this morning.  No fevers  Objective:   Vitals:   09/26/19 1422 09/26/19 1646 09/26/19 1902 09/26/19 2111  BP: 125/76  (!) 135/93 137/79  Pulse: (!) 44 92 72 69  Resp:   16 18  Temp: 97.8 F (36.6 C)  97.9 F (36.6 C) 98.4 F (36.9 C)  TempSrc: Oral  Oral Oral  SpO2: 98%  100% 100%  Weight:      Height:        Intake/Output Summary (Last 24 hours) at 09/27/2019 1138 Last data filed at 09/27/2019 0012 Gross per 24 hour  Intake 308.8 ml  Output 200 ml  Net 108.8 ml     Wt Readings from Last 3 Encounters:  09/23/19 79.4 kg  07/07/19 83.5 kg  07/04/19 74.8 kg   Physical Exam  General: Sleepy but arousable  Eyes:   HEENT:   Cardiovascular: S1 S2 clear, RRR. No pedal edema b/l  Respiratory: CTAB, no wheezing, rales or rhonchi  Gastrointestinal: Soft, NT, ND, NBS, per chole tube+  Ext: no pedal edema bilaterally  Neuro: no new deficits  Musculoskeletal: No cyanosis, clubbing  Skin: No  rashes  Psych: sleepy   Data Reviewed:  I have personally reviewed following labs and imaging studies  Micro Results No results found for this or any previous visit (from the past 240 hour(s)).  Radiology Reports CT HEAD WO CONTRAST  Result Date: 09/17/2019 CLINICAL DATA:  Encephalopathy. EXAM: CT HEAD WITHOUT CONTRAST TECHNIQUE: Contiguous axial images were obtained from the base of the skull through the vertex without intravenous contrast. COMPARISON:  October 02, 2012. FINDINGS: Brain: Mild diffuse cortical atrophy is noted. Mild chronic ischemic white matter disease is noted. No mass effect or midline shift is noted. Ventricular size is within normal limits. There is no evidence of mass lesion, hemorrhage or acute infarction. Vascular: No hyperdense vessel or unexpected calcification. Skull: Normal. Negative for fracture or focal lesion. Sinuses/Orbits: No acute finding. Other: None. IMPRESSION: Mild diffuse cortical atrophy. Mild chronic ischemic white matter disease. No acute intracranial abnormality seen. Electronically Signed   By: Marijo Conception M.D.   On: 09/17/2019 11:46   CT ABDOMEN PELVIS W CONTRAST  Result Date: 09/16/2019 CLINICAL DATA:  Acute abdominal pain, generalized. EXAM: CT ABDOMEN AND PELVIS WITH CONTRAST TECHNIQUE: Multidetector CT imaging of the abdomen and pelvis was performed using the standard protocol following bolus administration of intravenous contrast. CONTRAST:  166m OMNIPAQUE IOHEXOL 300 MG/ML  SOLN COMPARISON:  10/03/2018 FINDINGS: Lower chest: Small subpleural lymph node in the right lower lobe stable since 2016. Signs of scarring and mild subpleural reticulation, unchanged. No signs of consolidation or evidence of pleural effusion. Hepatobiliary: Liver is unremarkable. There is moderate distension of the gallbladder with signs of gallbladder wall thickening and pericholecystic stranding. Wall thickness difficult to read on CT but is much as 4 mm despite the  distended appearance of the gallbladder. There is no intra or extrahepatic biliary ductal distension. Pancreas: Unremarkable. No pancreatic ductal dilatation or surrounding inflammatory changes. Spleen: Normal in size without focal abnormality. Adrenals/Urinary Tract: Signs of right nephrectomy with stable small area of soft tissue nodularity along the ligated right renal artery measuring approximately 1.5 cm this has been present since 2007. Adrenal glands are normal. Small presumed cysts  in the left kidney are unchanged. Stomach/Bowel: No signs of acute gastrointestinal process. Normal appendix. Vascular/Lymphatic: Calcified atherosclerotic changes of the abdominal aorta up to 3 x 2.7 cm in greatest axial dimension. No signs of adenopathy in the upper abdomen or retroperitoneum. No signs of pelvic adenopathy. Vascular disease extends into branch vessels throughout the abdomen and pelvis. Reproductive: Prostatomegaly. No sign of pelvic adenopathy. Other: No signs of free air. No abscess. Musculoskeletal: Spinal degenerative changes with signs of lower lumbar laminectomy. Grade 1 anterolisthesis of L5 on S1. Finding similar to prior study. Also mild anterolisthesis of L4 on L5. IMPRESSION: 1. CT findings of acute cholecystitis. 2. Signs of right nephrectomy with stable small area of soft tissue nodularity along the ligated right renal artery. Unchanged since 2007. 3. Aortic atherosclerosis. 4. Prostatomegaly. Aortic Atherosclerosis (ICD10-I70.0). Electronically Signed   By: Zetta Bills M.D.   On: 09/16/2019 12:39   IR Catheter Tube Change  Result Date: 09/27/2019 INDICATION: 85 year old male with history of acute calculus cholecystitis status post placement of a percutaneous cholecystostomy tube on 09/18/2019. Earlier today, the patient inadvertently pulled on his tube displacing it by approximately 6 inches in resulting in significant bleeding from the tube entry site. The tube is now clotted off with  blood. Patient presents for tube check and likely exchange. EXAM: IR CATHETER TUBE CHANGE MEDICATIONS: None ANESTHESIA/SEDATION: None FLUOROSCOPY TIME:  Fluoroscopy Time: 1 minutes 12 seconds (24.4 mGy). COMPLICATIONS: None immediate. PROCEDURE: Informed written consent was obtained from the patient after a thorough discussion of the procedural risks, benefits and alternatives. All questions were addressed. Maximal Sterile Barrier Technique was utilized including caps, mask, sterile gowns, sterile gloves, sterile drape, hand hygiene and skin antiseptic. A timeout was performed prior to the initiation of the procedure. A gentle hand injection of contrast material was performed. The tube has pulled back significantly but remains just within the gallbladder lumen. The tube was transected and carefully removed over a stiff Glidewire. A new Cook 10.2 [place] all-purpose drainage catheter was advanced over the wire and formed in the gallbladder lumen. A hand injection of contrast material was performed confirming the tube location. Images were obtained and stored for the medical record. Of note, the cystic duct is patent. IMPRESSION: 1. Successful rescue and exchange for a new 10.2 French percutaneous cholecystostomy tube. 2. The cystic duct is patent. PLAN: 1. Continue to follow-up with general surgery to determine if patient would be a candidate for elective cholecystectomy in the future. 2. Return to interventional Radiology in 8 weeks for routine percutaneous cholecystostomy tube exchange. Signed, Criselda Peaches, MD, [facility]iology Electronically Signed   By: Jacqulynn Cadet M.D.   On: 09/27/2019 09:02   NM Hepato W/EjeCT Fract  Result Date: 09/17/2019 CLINICAL DATA:  RIGHT upper quadrant pain with nausea and vomiting for 3 days EXAM: NUCLEAR MEDICINE HEPATOBILIARY IMAGING TECHNIQUE: Sequential images of the abdomen were obtained out to 60 minutes  following intravenous administration of radiopharmaceutical. RADIOPHARMACEUTICALS:  5.35 mCi Tc-95m Choletec IV Pharmaceutical: Morphine sulfate 3 mg IV COMPARISON:  Ultrasound abdomen 09/16/2019 FINDINGS: Normal tracer extraction from bloodstream indicating normal hepatocellular function. Normal excretion of tracer into biliary tree. Small bowel visualized at 14 minutes. At 1 hour, gallbladder had not visualized. Patient then received morphine and imaging was continued for an additional 30 minutes. Gallbladder failed to visualize following morphine augmentation. Findings are consistent with cystic duct obstruction and acute cholecystitis. IMPRESSION: Patent CBD. Nonvisualization of the gallbladder despite  morphine augmentation consistent with cystic duct obstruction and acute cholecystitis. Electronically Signed   By: Lavonia Dana M.D.   On: 09/17/2019 14:36   US Abdomen Limited  Result Date: 09/16/2019 CLINICAL DATA:  Abdominal pain. CT abdomen and pelvis today demonstrated findings worrisome for cholecystitis. EXAM: ULTRASOUND ABDOMEN LIMITED RIGHT UPPER QUADRANT COMPARISON:  CT abdomen and pelvis earlier today. FINDINGS: Gallbladder: No gallstones or pericholecystic fluid. Sonographer reports negative Murphy's sign. The gallbladder is mildly distended and there is minimal wall thickening at 0.4 cm. Common bile duct: Diameter: 0.8 cm Liver: No focal lesion. Echogenicity is increased. Portal vein is patent on color Doppler imaging with normal direction of blood flow towards the liver. Other: None. IMPRESSION: Negative for gallstones or acute cholecystitis. Fatty infiltration of the liver. Electronically Signed   By: Inge Rise M.D.   On: 09/16/2019 13:59   IR Perc Cholecystostomy  Result Date: 09/18/2019 CLINICAL DATA:  Acute cholecystitis, currently poor operative candidate EXAM: PERCUTANEOUS CHOLECYSTOSTOMY TUBE PLACEMENT WITH ULTRASOUND AND FLUOROSCOPIC GUIDANCE FLUOROSCOPY TIME:  0.5 minutes;  197 uGym2 DAP TECHNIQUE: The procedure, risks (including but not limited to bleeding, infection, organ damage ), benefits, and alternatives were explained to the patient and family. Questions regarding the procedure were encouraged and answered. The family understands and consents to the procedure. Survey ultrasound of the abdomen was performed and an appropriate skin entry site was identified. Skin site was marked, prepped with Betadine, and draped in usual sterile fashion, and infiltrated locally with 1% lidocaine. Intravenous Fentanyl 126mg and Versed 22mwere administered as conscious sedation during continuous monitoring of the patient's level of consciousness and physiological / cardiorespiratory status by the radiology RN, with a total moderate sedation time of 10 minutes. Under real-time ultrasound guidance, gallbladder was accessed using a transhepatic approach with a 21-gauge needle. Ultrasound image documentation was saved. Bile returned through the hub. Needle was exchanged over a 018 guidewire for transitional dilator which allowed placement of 035 J wire. Over this, a 10.2 French pigtail catheter was advanced and formed centrally in the gallbladder lumen. Small contrast injection confirmed appropriate position. Catheter secured externally with 0 Prolene suture and placed external drain bag. Patient tolerated the procedure well. COMPLICATIONS: COMPLICATIONS none IMPRESSION: 1. Technically successful percutaneous cholecystostomy tube placement with ultrasound and fluoroscopic guidance. Electronically Signed   By: D Lucrezia Europe.D.   On: 09/18/2019 14:56   DG Chest Portable 1 View  Result Date: 09/16/2019 CLINICAL DATA:  Fever, dyspnea. EXAM: PORTABLE CHEST 1 VIEW COMPARISON:  Chest radiograph 12/12/2018 FINDINGS: The patient is significantly rotated to the right, limiting evaluation. Heart size within normal limits. No definite airspace consolidation within the lungs. No evidence of pleural effusion  or pneumothorax. No acute bony abnormality. Overlying cardiac monitoring leads. IMPRESSION: Patient significantly rotated to the right, limiting evaluation. No definite airspace consolidation. Electronically Signed   By: KyKellie SimmeringO   On: 09/16/2019 15:16   DG Abd Portable 1V  Result Date: 09/20/2019 CLINICAL DATA:  Biliary drainage tube. EXAM: PORTABLE ABDOMEN - 1 VIEW COMPARISON:  September 18, 2019. FINDINGS: The bowel gas pattern is normal. Percutaneous drainage catheter is noted in right upper quadrant. No radio-opaque calculi or other significant radiographic abnormality are seen. IMPRESSION: Percutaneous drainage catheter is noted in right upper quadrant. Whether or not catheter tip is positioned within the gallbladder lumen cannot be determined by this radiograph. Electronically Signed   By: JaMarijo Conception.D.   On: 09/20/2019 11:17   ECHOCARDIOGRAM COMPLETE  Result  Date: 09/19/2019   ECHOCARDIOGRAM REPORT   Patient Name:   LABRON BLOODGOOD Date of Exam: 09/19/2019 Medical Rec #:  [id number]   Height:       72.0 in Accession #:    [id number]  Weight:       178.1 lb Date of Birth:  Dec 20, 1931  BSA:          2.03 m Patient Age:    89 years    BP:           137/69 mmHg Patient Gender: M           HR:           94 bpm. Exam Location:  Inpatient Procedure: 2D Echo Indications:    Atrial Fibrillation 427.31 / I48.91  History:        Patient has prior history of Echocardiogram examinations, most                 recent 12/12/2018. CAD; Risk Factors:Hypertension. AKI                 Sepsis.  Sonographer:    Vikki Ports Turrentine Referring Phys: [id number] DANIEL V THOMPSON  Sonographer Comments: Suboptimal subcostal window due to patient body habitus. IMPRESSIONS  1. Left ventricular ejection fraction, by visual estimation, is 55 to 60%. The left ventricle has normal function. There is mildly increased left ventricular hypertrophy.  2. Left ventricular diastolic parameters are indeterminate.  3. The left ventricle has  no regional wall motion abnormalities.  4. Global right ventricle has normal systolic function.The right ventricular size is normal. No increase in right ventricular wall thickness.  5. Left atrial size was moderately dilated.  6. Right atrial size was normal.  7. The mitral valve is normal in structure. Mild to moderate mitral valve regurgitation.  8. Very eccentric anterior directed MR jet. The degree of MR may be underestimated due to the eccentrity of the jet. The MV to AV VTI ratio is 0.6 suggesting mild MR.  9. The tricuspid valve is normal in structure. Tricuspid valve regurgitation is trivial. 10. The aortic valve is tricuspid. Aortic valve regurgitation is not visualized. No evidence of aortic valve sclerosis or stenosis. 11. Pulmonic regurgitation is mild. 12. The pulmonic valve was not well visualized. Pulmonic valve regurgitation is mild. 13. The interatrial septum was not well visualized. FINDINGS  Left Ventricle: Left ventricular ejection fraction, by visual estimation, is 55 to 60%. The left ventricle has normal function. The left ventricle has no regional wall motion abnormalities. There is mildly increased left ventricular hypertrophy. Left ventricular diastolic parameters are indeterminate. Right Ventricle: The right ventricular size is normal. No increase in right ventricular wall thickness. Global RV systolic function is has normal systolic function. Left Atrium: Left atrial size was moderately dilated. Right Atrium: Right atrial size was normal in size Pericardium: There is no evidence of pericardial effusion. Mitral Valve: The mitral valve is normal in structure. Mild to moderate mitral valve regurgitation. Very eccentric anterior directed MR jet. The degree of MR may be underestimated due to the eccentrity of the jet. The MV to AV VTI ratio is 0.6 suggesting  mild MR. Tricuspid Valve: The tricuspid valve is normal in structure. Tricuspid valve regurgitation is trivial. Aortic Valve: The aortic  valve is tricuspid. Aortic valve regurgitation is not visualized. The aortic valve is structurally normal, with no evidence of sclerosis or stenosis. Aortic valve mean gradient measures 7.3 mmHg. Aortic valve peak gradient measures 13.7 mmHg. Aortic  valve area, by VTI measures 1.77 cm. Pulmonic Valve: The pulmonic valve was not well visualized. Pulmonic valve regurgitation is mild. Pulmonic regurgitation is mild. No evidence of pulmonic stenosis. Aorta: The aortic root is normal in size and structure. Pulmonary Artery: Indeterminate PASP, inadequate TR jet. Venous: The inferior vena cava was not well visualized. IAS/Shunts: The interatrial septum was not well visualized.  LEFT VENTRICLE PLAX 2D LVIDd:         4.50 cm LVIDs:         3.30 cm LV PW:         1.20 cm LV IVS:        1.20 cm LVOT diam:     2.10 cm LV SV:         48 ml LV SV Index:   23.80 LVOT Area:     3.46 cm  RIGHT VENTRICLE RV S prime:     16.00 cm/s TAPSE (M-mode): 1.6 cm LEFT ATRIUM              Index       RIGHT ATRIUM           Index LA diam:        4.60 cm  2.27 cm/m  RA Area:     13.90 cm LA Vol (A2C):   103.0 ml 50.79 ml/m RA Volume:   27.40 ml  13.51 ml/m LA Vol (A4C):   52.3 ml  25.79 ml/m LA Biplane Vol: 78.9 ml  38.90 ml/m  AORTIC VALVE AV Area (Vmax):    1.72 cm AV Area (Vmean):   1.81 cm AV Area (VTI):     1.77 cm AV Vmax:           185.22 cm/s AV Vmean:          124.794 cm/s AV VTI:            0.352 m AV Peak Grad:      13.7 mmHg AV Mean Grad:      7.3 mmHg LVOT Vmax:         92.03 cm/s LVOT Vmean:        65.200 cm/s LVOT VTI:          0.180 m LVOT/AV VTI ratio: 0.51  AORTA Ao Root diam: 3.40 cm MITRAL VALVE                        TRICUSPID VALVE MV Area (PHT): 3.91 cm             TR Peak grad:   13.4 mmHg MV VTI:        0.21 m               TR Vmax:        183.00 cm/s MV PHT:        56.26 msec MV Decel Time: 194 msec             SHUNTS MV E velocity: 84.27 cm/s 103 cm/s  Systemic VTI:  0.18 m MV A velocity: 79.70 cm/s 70.3  cm/s Systemic Diam: 2.10 cm MV E/A ratio:  1.06       1.5  Carlyle Dolly MD Electronically signed by Carlyle Dolly MD Signature Date/Time: 09/19/2019/12:26:36 PM    Final     Lab Data:  CBC: Recent Labs  Lab 09/21/19 0214 09/22/19 6333 09/23/19 0500 09/26/19 0420 09/27/19 0934  WBC 9.6 11.6* 13.8* 15.0* 13.9*  NEUTROABS 6.7  --  10.8*  --   --   HGB 7.9* 10.2* 10.0* 10.4* 9.6*  HCT 24.6* 32.6* 32.6* 34.6* 31.3*  MCV 80.9 84.2 84.2 85.0 85.1  PLT 199 261 290 366 518   Basic Metabolic Panel: Recent Labs  Lab 09/22/19 0500 09/22/19 0638 09/23/19 0500 09/26/19 0420 09/27/19 0934  NA 139 140 141 139 139  K 4.5 3.3* 3.8 4.1 4.0  CL 106 108 110 108 107  CO2 '23 24 22 ' 21* 25  GLUCOSE 114* 108* 97 103* 116*  BUN '12 11 11 21 ' 26*  CREATININE 0.96 0.87 0.94 1.16 1.30*  CALCIUM 8.3* 8.5* 8.6* 9.0 8.8*  MG 2.3 2.1 1.9  --   --    GFR: Estimated Creatinine Clearance: 46.4 mL/min (A) (by C-G formula based on SCr of 1.3 mg/dL (H)). Liver Function Tests: Recent Labs  Lab 09/22/19 0500 09/22/19 0638 09/23/19 0500 09/26/19 0420 09/27/19 0934  AST 57* 29 39 46* 40  ALT 53* 54* 67* 111* 99*  ALKPHOS 67 64 64 59 81  BILITOT 2.1* 1.3* 0.8 0.8 0.7  PROT 6.2* 6.0* 5.7* 5.9* 5.3*  ALBUMIN 2.8* 2.8* 2.6* 2.9* 2.5*   No results for input(s): LIPASE, AMYLASE in the last 168 hours. No results for input(s): AMMONIA in the last 168 hours. Coagulation Profile: No results for input(s): INR, PROTIME in the last 168 hours. Cardiac Enzymes: No results for input(s): CKTOTAL, CKMB, CKMBINDEX, TROPONINI in the last 168 hours. BNP (last 3 results) No results for input(s): PROBNP in the last 8760 hours. HbA1C: No results for input(s): HGBA1C in the last 72 hours. CBG: Recent Labs  Lab 09/26/19 0754 09/26/19 2214 09/27/19 0009 09/27/19 0750 09/27/19 1131  GLUCAP 81 113* 109* 117* 119*   Lipid Profile: No results for input(s): CHOL, HDL, LDLCALC, TRIG, CHOLHDL, LDLDIRECT in the last  72 hours. Thyroid Function Tests: No results for input(s): TSH, T4TOTAL, FREET4, T3FREE, THYROIDAB in the last 72 hours. Anemia Panel: No results for input(s): VITAMINB12, FOLATE, FERRITIN, TIBC, IRON, RETICCTPCT in the last 72 hours. Urine analysis:    Component Value Date/Time   COLORURINE YELLOW 09/16/2019 1455   APPEARANCEUR CLEAR 09/16/2019 1455   LABSPEC 1.010 09/16/2019 1455   PHURINE 7.0 09/16/2019 1455   GLUCOSEU NEGATIVE 09/16/2019 1455   HGBUR SMALL (A) 09/16/2019 1455   BILIRUBINUR NEGATIVE 09/16/2019 1455   KETONESUR NEGATIVE 09/16/2019 1455   PROTEINUR 100 (A) 09/16/2019 1455   UROBILINOGEN 1.0 11/27/2013 0947   NITRITE NEGATIVE 09/16/2019 1455   LEUKOCYTESUR NEGATIVE 09/16/2019 1455     Garfield Coiner M.D. [facility] 09/27/2019, 11:38 AM   Call night coverage person covering after 7pm

## 2019-09-28 LAB — COMPREHENSIVE METABOLIC PANEL
ALT: 74 U/L — ABNORMAL HIGH (ref 0–44)
AST: 24 U/L (ref 15–41)
Albumin: 2.4 g/dL — ABNORMAL LOW (ref 3.5–5.0)
Alkaline Phosphatase: 61 U/L (ref 38–126)
Anion gap: 9 (ref 5–15)
BUN: 22 mg/dL (ref 8–23)
CO2: 21 mmol/L — ABNORMAL LOW (ref 22–32)
Calcium: 8.7 mg/dL — ABNORMAL LOW (ref 8.9–10.3)
Chloride: 107 mmol/L (ref 98–111)
Creatinine, Ser: 1.31 mg/dL — ABNORMAL HIGH (ref 0.61–1.24)
GFR calc Af Amer: 57 mL/min — ABNORMAL LOW (ref >60)
GFR calc non Af Amer: 49 mL/min — ABNORMAL LOW (ref >60)
Glucose, Bld: 94 mg/dL (ref 70–99)
Potassium: 3.9 mmol/L (ref 3.5–5.1)
Sodium: 137 mmol/L (ref 135–145)
Total Bilirubin: 0.6 mg/dL (ref 0.3–1.2)
Total Protein: 5.2 g/dL — ABNORMAL LOW (ref 6.5–8.1)

## 2019-09-28 LAB — CBC
HCT: 29.4 % — ABNORMAL LOW (ref 39.0–52.0)
Hemoglobin: 8.7 g/dL — ABNORMAL LOW (ref 13.0–17.0)
MCH: 25.5 pg — ABNORMAL LOW (ref 26.0–34.0)
MCHC: 29.6 g/dL — ABNORMAL LOW (ref 30.0–36.0)
MCV: 86.2 fL (ref 80.0–100.0)
Platelets: 260 10*3/uL (ref 150–400)
RBC: 3.41 MIL/uL — ABNORMAL LOW (ref 4.22–5.81)
RDW: 16.7 % — ABNORMAL HIGH (ref 11.5–15.5)
WBC: 11.2 10*3/uL — ABNORMAL HIGH (ref 4.0–10.5)
nRBC: 0 % (ref 0.0–0.2)

## 2019-09-28 LAB — GLUCOSE, CAPILLARY
Glucose-Capillary: 116 mg/dL — ABNORMAL HIGH (ref 70–99)
Glucose-Capillary: 116 mg/dL — ABNORMAL HIGH (ref 70–99)
Glucose-Capillary: 131 mg/dL — ABNORMAL HIGH (ref 70–99)
Glucose-Capillary: 173 mg/dL — ABNORMAL HIGH (ref 70–99)
Glucose-Capillary: 90 mg/dL (ref 70–99)

## 2019-09-28 NOTE — Progress Notes (Signed)
Referring Physician(s): [place]  Supervising Physician: Arne Cleveland  Patient Status:  [name]  Chief Complaint:  cholecystitis  Subjective: Pt currently doing ok; has some soreness at GB drain site; denies N/V   Allergies: Effexor [venlafaxine], Lisinopril, Paxil [paroxetine], Viagra [sildenafil], and Wellbutrin [bupropion]  Medications: Prior to Admission medications   Medication Sig Start Date End Date Taking? Authorizing Provider  acetaminophen (TYLENOL) 500 MG tablet Take 1,000 mg by mouth every 6 (six) hours as needed for moderate pain or headache.   Yes [provider]  amLODipine (NORVASC) 5 MG tablet Take 1 tablet (5 mg total) by mouth daily. 08/25/19  Yes Croitoru, Mihai, MD  aspirin 81 MG chewable tablet Chew 81 mg by mouth daily.   Yes [provider]  atorvastatin (LIPITOR) 80 MG tablet Take 40 mg by mouth daily.   Yes [provider]  clopidogrel (PLAVIX) 75 MG tablet Take 75 mg by mouth daily.   Yes [provider]  docusate sodium (COLACE) 250 MG capsule Take 250 mg by mouth daily.   Yes [provider]  finasteride (PROSCAR) 5 MG tablet Take 5 mg by mouth every evening.    Yes [provider]  hydroxychloroquine (PLAQUENIL) 200 MG tablet Take 200 mg by mouth daily.   Yes [provider]  Multiple Vitamin (MULTIVITAMIN WITH MINERALS) TABS tablet Take 1 tablet by mouth every evening.    Yes [provider]  nitroGLYCERIN (NITRODUR - DOSED IN MG/24 HR) 0.1 mg/hr patch Place 0.1 mg onto the skin daily. 08/14/19  Yes [provider]  nitroGLYCERIN (NITROSTAT) 0.3 MG SL tablet Place 0.3 mg under the tongue every 5 (five) minutes as needed for chest pain.   Yes [provider]  Omega-3 Fatty Acids (FISH OIL) 1000 MG CAPS Take 1,000 mg by mouth daily.   Yes [provider]  pantoprazole (PROTONIX) 40 MG tablet Take 40 mg by mouth 2 (two) times daily. 08/03/19  Yes  [provider]  polyethylene glycol (MIRALAX / GLYCOLAX) 17 g packet Take 17 g by mouth daily as needed for mild constipation.   Yes [provider]  predniSONE (DELTASONE) 5 MG tablet Take 5 mg by mouth daily with breakfast.   Yes [provider]  sertraline (ZOLOFT) 25 MG tablet Take 25 mg by mouth daily.   Yes [provider]  traMADol (ULTRAM) 50 MG tablet Take 50-100 mg by mouth every 6 (six) hours as needed for moderate pain.   Yes [provider]  traZODone (DESYREL) 50 MG tablet Take 50-100 mg by mouth at bedtime as needed for sleep. 08/24/19  Yes [provider]  HYDROcodone-acetaminophen (NORCO/VICODIN) 5-325 MG tablet Take 1 tablet by mouth every 6 (six) hours as needed for moderate pain ((score 4 to 6)). Patient not taking: Reported on 09/17/2019 12/13/18   Geradine Girt, DO     Vital Signs: BP 115/65 (BP Location: Right Arm)   Pulse 87   Temp 98.8 F (37.1 C) (Oral)   Resp 16   Ht 6' (1.829 m)   Wt 181 lb (82.1 kg)   SpO2 97%   BMI 24.55 kg/m   Physical Exam awake/alert; GB drain intact, insertion site okay, mildly tender to palpation, output 60+ cc bloody bile; drain irrigated without difficulty.   Imaging: IR Catheter Tube Change  Result Date: 09/27/2019 INDICATION: 85 year old male with history of acute calculus cholecystitis status post placement of a percutaneous cholecystostomy tube on 09/18/2019. Earlier today,  the patient inadvertently pulled on his tube displacing it by approximately 6 inches in resulting in significant bleeding from the tube entry site. The tube is now clotted off with blood. Patient presents for tube check and likely exchange. EXAM: IR CATHETER TUBE CHANGE MEDICATIONS: None ANESTHESIA/SEDATION: None FLUOROSCOPY TIME:  Fluoroscopy Time: 1 minutes 12 seconds (24.4 mGy). COMPLICATIONS: None immediate. PROCEDURE: Informed written consent was obtained from the patient after a thorough discussion of  the procedural risks, benefits and alternatives. All questions were addressed. Maximal Sterile Barrier Technique was utilized including caps, mask, sterile gowns, sterile gloves, sterile drape, hand hygiene and skin antiseptic. A timeout was performed prior to the initiation of the procedure. A gentle hand injection of contrast material was performed. The tube has pulled back significantly but remains just within the gallbladder lumen. The tube was transected and carefully removed over a stiff Glidewire. A new Cook 10.2 [place] all-purpose drainage catheter was advanced over the wire and formed in the gallbladder lumen. A hand injection of contrast material was performed confirming the tube location. Images were obtained and stored for the medical record. Of note, the cystic duct is patent. IMPRESSION: 1. Successful rescue and exchange for a new 10.2 French percutaneous cholecystostomy tube. 2. The cystic duct is patent. PLAN: 1. Continue to follow-up with general surgery to determine if patient would be a candidate for elective cholecystectomy in the future. 2. Return to interventional Radiology in 8 weeks for routine percutaneous cholecystostomy tube exchange. Signed, Criselda Peaches, MD, [facility] Electronically Signed   By: Jacqulynn Cadet M.D.   On: 09/27/2019 09:02    Labs:  CBC: Recent Labs    09/23/19 0500 09/26/19 0420 09/27/19 0934 09/28/19 0517  WBC 13.8* 15.0* 13.9* 11.2*  HGB 10.0* 10.4* 9.6* 8.7*  HCT 32.6* 34.6* 31.3* 29.4*  PLT 290 366 306 260    COAGS: Recent Labs    11/24/18 1122 07/04/19 1223 09/18/19 0905  INR 0.96 1.1 1.2  APTT  --  26  --     BMP: Recent Labs    09/23/19 0500 09/26/19 0420 09/27/19 0934 09/28/19 0517  NA 141 139 139 137  K 3.8 4.1 4.0 3.9  CL 110 108 107 107  CO2 22 21* 25 21*  GLUCOSE 97 103* 116* 94  BUN 11 21 26* 22  CALCIUM 8.6* 9.0 8.8* 8.7*  CREATININE 0.94  1.16 1.30* 1.31*  GFRNONAA >60 58* 50* 49*  GFRAA >60 >60 58* 57*    LIVER FUNCTION TESTS: Recent Labs    09/23/19 0500 09/26/19 0420 09/27/19 0934 09/28/19 0517  BILITOT 0.8 0.8 0.7 0.6  AST 39 46* 40 24  ALT 67* 111* 99* 74*  ALKPHOS 64 59 81 61  PROT 5.7* 5.9* 5.3* 5.2*  ALBUMIN 2.6* 2.9* 2.5* 2.4*    Assessment and Plan: Patient with history of acute cholecystitis and prior cholecystostomy on 09/18/2019; status post inadvertent displacement of the tube on 12/19 with subsequent drain replacement.  Afebrile, WBC 11.2 down from 13.9, hemoglobin 8.7 down  from 9.6; total bilirubin normal, creatinine 1.3; continue with drain irrigation; monitor labs closely ; patient will need follow-up with general surgery to determine if he would be candidate for elective cholecystectomy in the future.  He will be scheduled for routine drain exchange in 8 weeks in IR otherwise.    Electronically Signed: D. Rowe Robert, PA-C 09/28/2019, 11:19 AM   I spent a total of 15 minutes at the  the patient's bedside AND on the patient's hospital floor or unit, greater than 50% of which was counseling/coordinating care for gallbladder drain    Patient ID: Derrick [name], male   DOB: [birth year], 85 y.o.   MRN: [id number]

## 2019-09-28 NOTE — Care Management Important Message (Signed)
Important Message  Patient Details IM Letter given to Dessa Phi RN Case Manager to present to the Patient Name: Derrick [name] MRN: [id number] Date of Birth: 06-20-1932   Medicare Important Message Given:  Yes     Kerin Salen 09/28/2019, 2:42 PM

## 2019-09-28 NOTE — Final Consult Note (Signed)
Consultant Final Sign-Off Note    Assessment/Final recommendations  Derrick [name] is a 85 y.o. male followed by me for   Acute cholecystitis with bacteremia and sepsis - abdominal binder to help minimize pulling at tube.  - S/P IR perc chole drain, replaced 12/20 2/2 being dislodged - diet as tolerates - abx per ID given bacteremia - no surgery this admission. Plan for possible lap chole in the future - patient will need to follow up with Dr. Marlou Starks in 6 weeks  Wound care (if applicable): IR drain site per IR   Diet at discharge: regular diet from our standpoint but will defer to medicine   Activity at discharge: per primary team   Follow-up appointment:  6-8 weeks with Dr. Marlou Starks   Pending results:  Unresulted Labs (From admission, onward)   None       Medication recommendations:   Other recommendations:    Thank you for allowing [place] to participate in the care of your patient!  Please consult [place] again if you have further needs for your patient.  Kalman Drape 09/28/2019 10:32 AM    Subjective   CC: soreness at drain site  No vomiting. Pt states he is eating although not eating much. IR at bedside flushing drain.   Objective  Vital signs in last 24 hours: Temp:  [97.5 F (36.4 C)-98.8 F (37.1 C)] 98.8 F (37.1 C) (12/21 0527) Pulse Rate:  [52-87] 87 (12/21 0527) Resp:  [15-16] 16 (12/21 0527) BP: (102-136)/(63-96) 115/65 (12/21 0527) SpO2:  [96 %-99 %] 97 % (12/21 0527) Weight:  [82.1 kg] 82.1 kg (12/21 0500)  PE:  General: well appearing, NAD, pleasant Lungs: rate and effort normal GI: soft, ND/NT, perc chole drain with bloody output. Binder in place   Pertinent labs and Studies: Recent Labs    09/26/19 0420 09/27/19 0934 09/28/19 0517  WBC 15.0* 13.9* 11.2*  HGB 10.4* 9.6* 8.7*  HCT 34.6* 31.3* 29.4*   BMET Recent Labs    09/27/19 0934 09/28/19 0517  NA 139 137  K 4.0 3.9  CL 107 107  CO2 25 21*  GLUCOSE 116* 94  BUN 26* 22   CREATININE 1.30* 1.31*  CALCIUM 8.8* 8.7*   No results for input(s): LABURIN in the last 72 hours. Results for orders placed or performed during the hospital encounter of 09/16/19  Blood culture (routine x 2)     Status: Abnormal   Collection Time: 09/16/19 12:15 PM   Specimen: Right Antecubital; Blood  Result Value Ref Range Status   Specimen Description   Final    RIGHT ANTECUBITAL Performed at [facility], [address] [place], [place] [address]    Special Requests   Final    BOTTLES DRAWN AEROBIC AND ANAEROBIC Blood Culture adequate volume Performed at [facility], [address] [place], [place] [address]    Culture  Setup Time   Final    IN BOTH AEROBIC AND ANAEROBIC BOTTLES GRAM NEGATIVE RODS CRITICAL VALUE NOTED.  VALUE IS CONSISTENT WITH PREVIOUSLY REPORTED AND CALLED VALUE.    Culture (A)  Final    ESCHERICHIA COLI SUSCEPTIBILITIES PERFORMED ON PREVIOUS CULTURE WITHIN THE LAST 5 DAYS. Performed at [facility], [place] [address], [place], [place] [address]    Report Status 09/19/2019 FINAL  Final  Blood culture (routine x 2)     Status: Abnormal   Collection Time: 09/16/19 12:30 PM   Specimen: BLOOD LEFT WRIST  Result Value Ref Range Status   Specimen Description   Final    BLOOD LEFT WRIST Performed at [facility], [address] [place], [place] [address]    Special Requests   Final    BOTTLES DRAWN AEROBIC AND ANAEROBIC Blood Culture adequate volume Performed at [facility], [address] [place], [place] [address]    Culture  Setup Time   Final    IN BOTH AEROBIC AND ANAEROBIC BOTTLES GRAM NEGATIVE RODS CRITICAL RESULT CALLED TO, READ BACK BY AND VERIFIED WITH: B GREEN PHARMD 09/17/19 0428 JDW Performed at [facility], [address] [place], [place] [address]    Culture ESCHERICHIA COLI (A)  Final   Report Status 09/19/2019 FINAL  Final   Organism ID, Bacteria ESCHERICHIA COLI  Final       Susceptibility   Escherichia coli - MIC*    AMPICILLIN >=32 RESISTANT Resistant     CEFAZOLIN >=64 RESISTANT Resistant     CEFEPIME <=1 SENSITIVE Sensitive     CEFTAZIDIME <=1 SENSITIVE Sensitive     CEFTRIAXONE <=1 SENSITIVE Sensitive     CIPROFLOXACIN <=0.25 SENSITIVE Sensitive     GENTAMICIN <=1 SENSITIVE Sensitive     IMIPENEM <=0.25 SENSITIVE Sensitive     TRIMETH/SULFA <=20 SENSITIVE Sensitive     AMPICILLIN/SULBACTAM >=32 RESISTANT Resistant     PIP/TAZO 8 SENSITIVE Sensitive     * ESCHERICHIA COLI  Blood Culture ID Panel (Reflexed)     Status: Abnormal   Collection Time: 09/16/19 12:30 PM  Result Value Ref Range Status   Enterococcus species NOT DETECTED NOT DETECTED Final   Listeria monocytogenes NOT DETECTED NOT DETECTED Final   Staphylococcus species NOT DETECTED NOT DETECTED Final   Staphylococcus aureus (BCID) NOT DETECTED NOT DETECTED Final   Streptococcus species NOT DETECTED NOT DETECTED Final   Streptococcus agalactiae NOT DETECTED NOT DETECTED Final   Streptococcus pneumoniae NOT DETECTED NOT DETECTED Final   Streptococcus pyogenes NOT DETECTED NOT DETECTED Final   Acinetobacter baumannii NOT DETECTED NOT DETECTED Final   Enterobacteriaceae species DETECTED (A) NOT DETECTED Final    Comment: Enterobacteriaceae represent a large family of gram-negative bacteria, not a single organism. CRITICAL RESULT CALLED TO, READ BACK BY AND VERIFIED WITH: B GREEN PHARMD 09/17/19 0428 JDW    Enterobacter cloacae complex NOT DETECTED NOT DETECTED Final   Escherichia coli DETECTED (A) NOT DETECTED Final    Comment: CRITICAL RESULT CALLED TO, READ BACK BY AND VERIFIED WITH: B GREEN PHARMD 09/17/19 0428 JDW    Klebsiella oxytoca NOT DETECTED NOT DETECTED Final   Klebsiella pneumoniae NOT DETECTED NOT DETECTED Final   Proteus species NOT DETECTED NOT DETECTED Final   Serratia marcescens NOT DETECTED NOT DETECTED Final   Carbapenem resistance NOT DETECTED NOT DETECTED Final    Haemophilus influenzae NOT DETECTED NOT DETECTED Final   Neisseria meningitidis NOT DETECTED NOT DETECTED Final   Pseudomonas aeruginosa NOT DETECTED NOT DETECTED Final   Candida albicans NOT DETECTED NOT DETECTED Final   Candida glabrata NOT DETECTED NOT DETECTED Final   Candida krusei NOT DETECTED NOT DETECTED Final   Candida parapsilosis NOT DETECTED NOT DETECTED Final   Candida tropicalis NOT DETECTED NOT DETECTED Final    Comment: Performed at [facility], [place] [address], [place], [place] [address]  SARS CORONAVIRUS 2 (TAT 6-24 HRS) Nasopharyngeal Nasopharyngeal Swab     Status: None   Collection Time: 09/16/19 12:50 PM   Specimen:  Nasopharyngeal Swab  Result Value Ref Range Status   SARS Coronavirus 2 NEGATIVE NEGATIVE Final    Comment: (NOTE) SARS-CoV-2 target nucleic acids are NOT DETECTED. The SARS-CoV-2 RNA is generally detectable in upper and lower respiratory specimens during the acute phase of infection. Negative results do not preclude SARS-CoV-2 infection, do not rule out co-infections with other pathogens, and should not be used as the sole basis for treatment or other patient management decisions. Negative results must be combined with clinical observations, patient history, and epidemiological information. The expected result is Negative. Fact Sheet for Patients: SugarRoll.be Fact Sheet for Healthcare Providers: https://www.woods-mathews.com/ This test is not yet approved or cleared by the [place] FDA and  has been authorized for detection and/or diagnosis of SARS-CoV-2 by FDA under an Emergency Use Authorization (EUA). This EUA will remain  in effect (meaning this test can be used) for the duration of the COVID-19 declaration under Section 56 4(b)(1) of the Act, 21 U.S.C. section 360bbb-3(b)(1), unless the authorization is terminated or revoked sooner. Performed at [facility], [place] [address],  [place], [place] [address]   MRSA PCR Screening     Status: None   Collection Time: 09/16/19 11:34 PM   Specimen: Nasal Mucosa; Nasopharyngeal  Result Value Ref Range Status   MRSA by PCR NEGATIVE NEGATIVE Final    Comment:        The GeneXpert MRSA Assay (FDA approved for NASAL specimens only), is one component of a comprehensive MRSA colonization surveillance program. It is not intended to diagnose MRSA infection nor to guide or monitor treatment for MRSA infections. Performed at [facility], [place] [address], [place], [place] [address]     Imaging: No results found.

## 2019-09-28 NOTE — Progress Notes (Signed)
PROGRESS NOTE    Derrick [name]  EUM:[id number]  DOB: 06-09-1932  PCP: [name], MD Admit date:09/16/2019  85 y.o.malewith medical history significant ofRA on prednisone and hydroxychloroquine, CAD and PAD, HTN referred to [facility] ED by his PCP after he presented with with c/o periumbilical to lower abd pain, onset 3 days PTA and associated with N/V. Denied change in BM or urinary symptoms. ED Course: In ED, patient was found to be septic with WBC 17 K, temp 101.6 F, heart rate 134. Acute transaminitis, COVID-19 negative. CT abdomen pelvis worrisome for acute cholecystitis. Hospital course: Patient admitted to [facility] for further management with GI/GS consultation.Patient admitted with IV antibiotics for sepsis, cholecystostomy tube placed by IR on 12/11 with bilious drainage. Gen surgery recommended follow-up with Dr. Marlou Starks in 6 weeks for interval cholecystectomy.Hospitalization complicated by positive blood cultures, delirium with underlying dementia and post operative A fib with RVR. Will need skilled nursing facility at discharge. Subjective:  Patient sitting in bedside chair, no acute distress. Calm and cheerful during conversation. Tolerating diet well. Abdominal drain with blood drainage. Abdominal binder in place. Video-sitter in room  Objective: Vitals:   09/28/19 0002 09/28/19 0218 09/28/19 0500 09/28/19 0527  BP: 103/64 (!) 136/96  115/65  Pulse: 87 81  87  Resp:  16  16  Temp:  (!) 97.5 F (36.4 C)  98.8 F (37.1 C)  TempSrc:  Oral  Oral  SpO2:  96%  97%  Weight:   82.1 kg   Height:        Intake/Output Summary (Last 24 hours) at 09/28/2019 1233 Last data filed at 09/28/2019 0532 Gross per 24 hour  Intake 5 ml  Output 560 ml  Net -555 ml   Filed Weights   09/22/19 0528 09/23/19 0637 09/28/19 0500  Weight: 77.6 kg 79.4 kg 82.1 kg    Physical Examination:  General exam: Appears calm and comfortable  Respiratory system: Clear to auscultation. Respiratory  effort normal. Cardiovascular system: S1 & S2 heard, RRR. No JVD, murmurs, rubs, gallops or clicks. No pedal edema. Gastrointestinal system: RUQ Abdominal drain with blood drainage. Abdominal binder in place,nondistended, soft and nontender. Normal bowel sounds heard. Central nervous system: Alert and oriented. No new focal neurological deficits. Extremities: No contractures, edema or joint deformities.  Skin: No rashes, lesions or ulcers Psychiatry: Judgement and insight appear normal. Mood & affect appropriate.   Data Reviewed: I have personally reviewed following labs and imaging studies  CBC: Recent Labs  Lab 09/22/19 0638 09/23/19 0500 09/26/19 0420 09/27/19 0934 09/28/19 0517  WBC 11.6* 13.8* 15.0* 13.9* 11.2*  NEUTROABS  --  10.8*  --   --   --   HGB 10.2* 10.0* 10.4* 9.6* 8.7*  HCT 32.6* 32.6* 34.6* 31.3* 29.4*  MCV 84.2 84.2 85.0 85.1 86.2  PLT 261 290 366 306 540   Basic Metabolic Panel: Recent Labs  Lab 09/22/19 0500 09/22/19 0638 09/23/19 0500 09/26/19 0420 09/27/19 0934 09/28/19 0517  NA 139 140 141 139 139 137  K 4.5 3.3* 3.8 4.1 4.0 3.9  CL 106 108 110 108 107 107  CO2 '23 24 22 ' 21* 25 21*  GLUCOSE 114* 108* 97 103* 116* 94  BUN '12 11 11 21 ' 26* 22  CREATININE 0.96 0.87 0.94 1.16 1.30* 1.31*  CALCIUM 8.3* 8.5* 8.6* 9.0 8.8* 8.7*  MG 2.3 2.1 1.9  --   --   --    GFR: Estimated Creatinine Clearance: 45.3 mL/min (A) (by C-G formula based  on SCr of 1.31 mg/dL (H)). Liver Function Tests: Recent Labs  Lab 09/22/19 4650 09/23/19 0500 09/26/19 0420 09/27/19 0934 09/28/19 0517  AST 29 39 46* 40 24  ALT 54* 67* 111* 99* 74*  ALKPHOS 64 64 59 81 61  BILITOT 1.3* 0.8 0.8 0.7 0.6  PROT 6.0* 5.7* 5.9* 5.3* 5.2*  ALBUMIN 2.8* 2.6* 2.9* 2.5* 2.4*   No results for input(s): LIPASE, AMYLASE in the last 168 hours. No results for input(s): AMMONIA in the last 168 hours. Coagulation Profile: No results for input(s): INR, PROTIME in the last 168 hours. Cardiac  Enzymes: No results for input(s): CKTOTAL, CKMB, CKMBINDEX, TROPONINI in the last 168 hours. BNP (last 3 results) No results for input(s): PROBNP in the last 8760 hours. HbA1C: No results for input(s): HGBA1C in the last 72 hours. CBG: Recent Labs  Lab 09/27/19 1638 09/27/19 2110 09/28/19 0013 09/28/19 0742 09/28/19 1151  GLUCAP 129* 105* 90 173* 131*   Lipid Profile: No results for input(s): CHOL, HDL, LDLCALC, TRIG, CHOLHDL, LDLDIRECT in the last 72 hours. Thyroid Function Tests: No results for input(s): TSH, T4TOTAL, FREET4, T3FREE, THYROIDAB in the last 72 hours. Anemia Panel: No results for input(s): VITAMINB12, FOLATE, FERRITIN, TIBC, IRON, RETICCTPCT in the last 72 hours. Sepsis Labs: No results for input(s): PROCALCITON, LATICACIDVEN in the last 168 hours.  No results found for this or any previous visit (from the past 240 hour(s)).    Radiology Studies: IR Catheter Tube Change  Result Date: 09/27/2019 INDICATION: 85 year old male with history of acute calculus cholecystitis status post placement of a percutaneous cholecystostomy tube on 09/18/2019. Earlier today, the patient inadvertently pulled on his tube displacing it by approximately 6 inches in resulting in significant bleeding from the tube entry site. The tube is now clotted off with blood. Patient presents for tube check and likely exchange. EXAM: IR CATHETER TUBE CHANGE MEDICATIONS: None ANESTHESIA/SEDATION: None FLUOROSCOPY TIME:  Fluoroscopy Time: 1 minutes 12 seconds (24.4 mGy). COMPLICATIONS: None immediate. PROCEDURE: Informed written consent was obtained from the patient after a thorough discussion of the procedural risks, benefits and alternatives. All questions were addressed. Maximal Sterile Barrier Technique was utilized including caps, mask, sterile gowns, sterile gloves, sterile drape, hand hygiene and skin antiseptic. A timeout was performed prior to the initiation of the procedure. A gentle hand  injection of contrast material was performed. The tube has pulled back significantly but remains just within the gallbladder lumen. The tube was transected and carefully removed over a stiff Glidewire. A new Cook 10.2 [place] all-purpose drainage catheter was advanced over the wire and formed in the gallbladder lumen. A hand injection of contrast material was performed confirming the tube location. Images were obtained and stored for the medical record. Of note, the cystic duct is patent. IMPRESSION: 1. Successful rescue and exchange for a new 10.2 French percutaneous cholecystostomy tube. 2. The cystic duct is patent. PLAN: 1. Continue to follow-up with general surgery to determine if patient would be a candidate for elective cholecystectomy in the future. 2. Return to interventional Radiology in 8 weeks for routine percutaneous cholecystostomy tube exchange. Signed, Criselda Peaches, MD, [facility] Radiology Electronically Signed   By: Jacqulynn Cadet M.D.   On: 09/27/2019 09:02        Scheduled Meds: . aspirin  81 mg Oral Daily  . atorvastatin  40 mg Oral Daily  . Chlorhexidine Gluconate Cloth  6 each Topical Daily  . clopidogrel  75 mg  Oral Daily  . diltiazem  60 mg Oral Q6H  . docusate sodium  250 mg Oral Daily  . enoxaparin (LOVENOX) injection  40 mg Subcutaneous Daily  . feeding supplement (ENSURE ENLIVE)  237 mL Oral BID BM  . feeding supplement (PRO-STAT SUGAR FREE 64)  30 mL Oral BID  . finasteride  5 mg Oral QPM  . insulin aspart  0-5 Units Subcutaneous QHS  . insulin aspart  0-9 Units Subcutaneous TID WC  . mouth rinse  15 mL Mouth Rinse BID  . multivitamin with minerals  1 tablet Oral Daily  . omega-3 acid ethyl esters  1 g Oral Daily  . pantoprazole  40 mg Oral BID  . predniSONE  5 mg Oral Q breakfast  . QUEtiapine  25 mg Oral Q24H  . sertraline  25 mg Oral Daily  . sodium chloride flush  5 mL Intracatheter Q8H    Continuous Infusions: . sodium chloride Stopped (09/22/19 2342)  . piperacillin-tazobactam (ZOSYN)  IV 3.375 g (09/28/19 0830)    Assessment & Plan: Sepsis secondary to acute cholecystitis and E. coli bacteremia-Patient met sepsis criteria at the time of admission with fevers, leukocytosis, tachycardia, source likely due to acute cholecystitis.CT abdomen pelvis showed acute cholecystitis, patient also had transaminitis, rapidly progressive encephalopathy on admission.Blood cultureswerepositive for E. coli. Patient admitted with empiric IV antibiotics (Zosyn). Now s/p cholecystostomy tubeplacement by IR.Continue IV Zosyn per sensitivities, will likely need 10 to 14 days of antibiotics-plan to transition to Ceftin and Flagyl close to discharge.Patient has been transitioned from stress dose IV Solu-Cortef to oral prednisone, now on maintenance dose.(Patient on Plaquenil and chronic steroids for RA). GI recommended to continue current Rx -if patient does not improve with PERC drain or if LFTs continues to trend up, patient will need MRCP.Surgery following, will need outpatient follow-up with Dr. Marlou Starks in 6 weeks and then cholecystectomy  A. fib with RVR, paroxysmal-Patient was noted to be in atrial fibrillation with RVR post cholecystostomy tube on the evening of 12/11 and had to be placed on Cardizem drip.Heart rate now controlled, Cardizem drip has been off, currently tolerating oral Cardizem 60 mg every 6 hours.Continue Plavix for CAD/PAD but not on chronic blood thinners  Acute metabolic encephalopathy /Delrium: in the setting of infection and underlying mild dementia (per son).Head CT negative for acute abnormalities, showed mild chronic atrophy and chronic ischemic changes. Olanzapine was discontinued on 12/17, was placed on Seroquel 25 mg. Patient hasbeen doing well on Seroquel 25 mg, 8 PM daily (watch qtc).Patient has abdominal binder to help minimize pulling the tube,pulled Cole tube  out on 12/19, replaced back by IR  Rheumatoid arthritis- Plaquenil and prednisone were placed on hold on admission as NPO and patient was started on IV stress dose steroidsIV Solu-Cortef now transitioned to oral prednisoneand tapered back to home dose 5 mg daily. Plan to restart Plaquenil outpatient once acute infection has resolved  Coronary disease/PAD-Currently no chest pain or shortness of breath, stable.Continue home regimen of aspirin, Lipitor, Plavix.Outpatient follow-up with cardiology post discharge for preop clearance for possible cholecystectomy in about 6 to 8 weeks  Essential hypertension-Continue Cardizem  GERD-Continue PPI  QT prolongation-EKG on 12/15 showed sinus rhythm, QTC 477, will repeat in am -Avoid haldol, keep potassium> 4, magnesium above    DVT prophylaxis: lovenox Code Status: Full code Family / Patient Communication: D/W patient. Will call son . Disposition Plan: Patient family requesting skilled nursing facility for rehab, social work aware. Son, [name] is NOK.  LOS: 12 days    Time spent: 35 mins    Guilford Shi, MD [facility] Pager [phone]  If 7PM-7AM, please contact night-coverage [email or web address] Password [facility] 09/28/2019, 12:33 PM

## 2019-09-28 NOTE — TOC Progression Note (Signed)
Transition of Care [facility]) - Progression Note    Patient Details  Name: Derrick [name] MRN: [id number] Date of Birth: January 24, 1932  Transition of Care [facility]) CM/SW Contact  Shamiracle Gorden, Juliann Pulse, RN Phone Number: 09/28/2019, 10:46 AM  Clinical Narrative:TC son [name] [id number] chose from bed offers. Will need new covid within 48hrs of d/c since last covid 12/9-MD aware.       Expected Discharge Plan: [place] Barriers to Discharge: Continued Medical Work up  Expected Discharge Plan and Services Expected Discharge Plan: [place]   Discharge Planning Services: CM Consult Post Acute Care Choice: [place] Living arrangements for the past 2 months: Single Family Home                                       Social Determinants of Health (SDOH) Interventions    Readmission Risk Interventions Readmission Risk Prevention Plan 09/22/2019  Transportation Screening Complete  Medication Review Press photographer) Complete  PCP or Specialist appointment within 3-5 days of discharge Complete  HRI or Home Care Consult Complete  SW Recovery Care/Counseling Consult Complete  Palliative Care Screening Not [place] Complete  Some recent data might be hidden

## 2019-09-29 DIAGNOSIS — N179 Acute kidney failure, unspecified: Secondary | ICD-10-CM

## 2019-09-29 DIAGNOSIS — D500 Iron deficiency anemia secondary to blood loss (chronic): Secondary | ICD-10-CM

## 2019-09-29 LAB — HEMOGLOBIN AND HEMATOCRIT, BLOOD
HCT: 30 % — ABNORMAL LOW (ref 39.0–52.0)
Hemoglobin: 9 g/dL — ABNORMAL LOW (ref 13.0–17.0)

## 2019-09-29 LAB — GLUCOSE, CAPILLARY
Glucose-Capillary: 91 mg/dL (ref 70–99)
Glucose-Capillary: 127 mg/dL — ABNORMAL HIGH (ref 70–99)
Glucose-Capillary: 112 mg/dL — ABNORMAL HIGH (ref 70–99)

## 2019-09-29 MED ORDER — SODIUM CHLORIDE 0.9 % IV SOLN: INTRAVENOUS | Status: DC

## 2019-09-29 NOTE — Progress Notes (Signed)
Nutrition Follow-up  DOCUMENTATION CODES:   Not applicable  INTERVENTION:  - continue Ensure Enlive BID and 30 ml prostat BID. - continue to encourage PO intakes.    NUTRITION DIAGNOSIS:   Increased nutrient needs related to acute illness as evidenced by estimated needs. -revised, ongoing  GOAL:   Patient will meet greater than or equal to 90% of their needs -met on average  MONITOR:   PO intake, Supplement acceptance, Labs, Weight trends  ASSESSMENT:   85 y.o. male with medical history significant of RA on prednisone and hydroxychloroquine, CAD, PAD, and HTN. He presented to the ED from PCP's office with complaint of periumbilical and lower abdominal pain with associated N/V that began 3 days PTA. In the ED, he was found to be septic and was given 2L IV fluids. LFTs were noted to be significantly elevated.  Diet advanced from FLD to Soft on 12/19 at 0905. No intakes documented since diet advancement. Patient is a/o x1. Per review of Ensure and prostat orders, patient has accepted both of these supplements nearly 100% of the time offered. Weight has been fairly stable since admission on 12/9.   Per notes: - acute cholecystitis--s/p cholecystostomy by IR; will need MRCP; will need outpatient follow-up in 6 weeks and then to have cholecystectomy  - afib with RVR--now controlled on PO cardizem - acute metabolic encephalopathy/delirium--CT head negative - family requesting SNF for rehab    Labs reviewed; CBGs: 91 and 127 mg/dl, creatinine: 1.31 mg/dl, Ca: 8.7 mg/dl, GFR: 49 ml/min.  Medications reviewed; 250 mg colace/day, sliding scale novolog, daily multivitamin with minerals, 40 mg oral protonix BID, 5 mg deltasone/day.   Diet Order:   Diet Order            DIET SOFT Room service appropriate? Yes; Fluid consistency: Thin  Diet effective now              EDUCATION NEEDS:   No education needs have been identified at this time  Skin:  Skin Assessment: Reviewed RN  Assessment  Last BM:  PTA/unknown  Height:   Ht Readings from Last 1 Encounters:  09/16/19 6' (1.829 m)    Weight:   Wt Readings from Last 1 Encounters:  09/28/19 82.1 kg    Ideal Body Weight:  80.9 kg  BMI:  Body mass index is 24.55 kg/m.  Estimated Nutritional Needs:   Kcal:  2020-2260 kcal  Protein:  100-115 grams  Fluid:  >/= 2 L/day     Jarome Matin, MS, RD, LDN, [facility] Inpatient Clinical Dietitian Pager # [phone] After hours/weekend pager # [phone]

## 2019-09-29 NOTE — Progress Notes (Signed)
Referring Physician(s): [place]  Supervising Physician: Jacqulynn Cadet  Patient Status:  [facility] - In-pt  Chief Complaint:  Abdominal pain/cholecystitis  Subjective: Pt sitting up in chair; no new c/o; some soreness at GB drain site; denies N/V; has eaten small amt this am   Allergies: Effexor [venlafaxine], Lisinopril, Paxil [paroxetine], Viagra [sildenafil], and Wellbutrin [bupropion]  Medications: Prior to Admission medications   Medication Sig Start Date End Date Taking? Authorizing Provider  acetaminophen (TYLENOL) 500 MG tablet Take 1,000 mg by mouth every 6 (six) hours as needed for moderate pain or headache.   Yes [provider]  amLODipine (NORVASC) 5 MG tablet Take 1 tablet (5 mg total) by mouth daily. 08/25/19  Yes Croitoru, Mihai, MD  aspirin 81 MG chewable tablet Chew 81 mg by mouth daily.   Yes [provider]  atorvastatin (LIPITOR) 80 MG tablet Take 40 mg by mouth daily.   Yes [provider]  clopidogrel (PLAVIX) 75 MG tablet Take 75 mg by mouth daily.   Yes [provider]  docusate sodium (COLACE) 250 MG capsule Take 250 mg by mouth daily.   Yes [provider]  finasteride (PROSCAR) 5 MG tablet Take 5 mg by mouth every evening.    Yes [provider]  hydroxychloroquine (PLAQUENIL) 200 MG tablet Take 200 mg by mouth daily.   Yes [provider]  Multiple Vitamin (MULTIVITAMIN WITH MINERALS) TABS tablet Take 1 tablet by mouth every evening.    Yes [provider]  nitroGLYCERIN (NITRODUR - DOSED IN MG/24 HR) 0.1 mg/hr patch Place 0.1 mg onto the skin daily. 08/14/19  Yes [provider]  nitroGLYCERIN (NITROSTAT) 0.3 MG SL tablet Place 0.3 mg under the tongue every 5 (five) minutes as needed for chest pain.   Yes [provider]  Omega-3 Fatty Acids (FISH OIL) 1000 MG CAPS Take 1,000 mg by mouth daily.   Yes [provider]  pantoprazole (PROTONIX) 40 MG tablet  Take 40 mg by mouth 2 (two) times daily. 08/03/19  Yes [provider]  polyethylene glycol (MIRALAX / GLYCOLAX) 17 g packet Take 17 g by mouth daily as needed for mild constipation.   Yes [provider]  predniSONE (DELTASONE) 5 MG tablet Take 5 mg by mouth daily with breakfast.   Yes [provider]  sertraline (ZOLOFT) 25 MG tablet Take 25 mg by mouth daily.   Yes [provider]  traMADol (ULTRAM) 50 MG tablet Take 50-100 mg by mouth every 6 (six) hours as needed for moderate pain.   Yes [provider]  traZODone (DESYREL) 50 MG tablet Take 50-100 mg by mouth at bedtime as needed for sleep. 08/24/19  Yes [provider]  HYDROcodone-acetaminophen (NORCO/VICODIN) 5-325 MG tablet Take 1 tablet by mouth every 6 (six) hours as needed for moderate pain ((score 4 to 6)). Patient not taking: Reported on 09/17/2019 12/13/18   Geradine Girt, DO     Vital Signs: BP 117/70 (BP Location: Left Arm)   Pulse 76   Temp 98.1 F (36.7 C)   Resp 18   Ht 6' (1.829 m)   Wt 181 lb (82.1 kg)   SpO2 97%   BMI 24.55 kg/m   Physical Exam awake/alert; GB drain intact, insertion site ok, sl tender to palpation; output 150 ml blood tinged bile; drain irrigated with no reflux noted at insertion site  Imaging: IR Catheter Tube Change  Result Date: 09/27/2019 INDICATION: 85 year old male with history of acute  calculus cholecystitis status post placement of a percutaneous cholecystostomy tube on 09/18/2019. Earlier today, the patient inadvertently pulled on his tube displacing it by approximately 6 inches in resulting in significant bleeding from the tube entry site. The tube is now clotted off with blood. Patient presents for tube check and likely exchange. EXAM: IR CATHETER TUBE CHANGE MEDICATIONS: None ANESTHESIA/SEDATION: None FLUOROSCOPY TIME:  Fluoroscopy Time: 1 minutes 12 seconds (24.4 mGy). COMPLICATIONS: None immediate. PROCEDURE: Informed written  consent was obtained from the patient after a thorough discussion of the procedural risks, benefits and alternatives. All questions were addressed. Maximal Sterile Barrier Technique was utilized including caps, mask, sterile gowns, sterile gloves, sterile drape, hand hygiene and skin antiseptic. A timeout was performed prior to the initiation of the procedure. A gentle hand injection of contrast material was performed. The tube has pulled back significantly but remains just within the gallbladder lumen. The tube was transected and carefully removed over a stiff Glidewire. A new Cook 10.2 [place] all-purpose drainage catheter was advanced over the wire and formed in the gallbladder lumen. A hand injection of contrast material was performed confirming the tube location. Images were obtained and stored for the medical record. Of note, the cystic duct is patent. IMPRESSION: 1. Successful rescue and exchange for a new 10.2 French percutaneous cholecystostomy tube. 2. The cystic duct is patent. PLAN: 1. Continue to follow-up with general surgery to determine if patient would be a candidate for elective cholecystectomy in the future. 2. Return to interventional Radiology in 8 weeks for routine percutaneous cholecystostomy tube exchange. Signed, Criselda Peaches, MD, [name] Radiology Electronically Signed   By: Jacqulynn Cadet M.D.   On: 09/27/2019 09:02    Labs:  CBC: Recent Labs    09/23/19 0500 09/26/19 0420 09/27/19 0934 09/28/19 0517  WBC 13.8* 15.0* 13.9* 11.2*  HGB 10.0* 10.4* 9.6* 8.7*  HCT 32.6* 34.6* 31.3* 29.4*  PLT 290 366 306 260    COAGS: Recent Labs    11/24/18 1122 07/04/19 1223 09/18/19 0905  INR 0.96 1.1 1.2  APTT  --  26  --     BMP: Recent Labs    09/23/19 0500 09/26/19 0420 09/27/19 0934 09/28/19 0517  NA 141 139 139 137  K 3.8 4.1 4.0 3.9  CL 110 108 107 107  CO2 22 21* 25 21*  GLUCOSE 97 103* 116* 94   BUN 11 21 26* 22  CALCIUM 8.6* 9.0 8.8* 8.7*  CREATININE 0.94 1.16 1.30* 1.31*  GFRNONAA >60 58* 50* 49*  GFRAA >60 >60 58* 57*    LIVER FUNCTION TESTS: Recent Labs    09/23/19 0500 09/26/19 0420 09/27/19 0934 09/28/19 0517  BILITOT 0.8 0.8 0.7 0.6  AST 39 46* 40 24  ALT 67* 111* 99* 74*  ALKPHOS 64 59 81 61  PROT 5.7* 5.9* 5.3* 5.2*  ALBUMIN 2.6* 2.9* 2.5* 2.4*    Assessment and Plan: Patient with history of acute cholecystitis and prior cholecystostomy on 09/18/2019; status post inadvertent displacement of the tube on 12/19 with subsequent drain replacement.  Afebrile; no new labs today; continue drain irrigation tid as IP; patient will need follow-up with general surgery to determine if he would be candidate for elective cholecystectomy in the future.  He will be scheduled for routine drain exchange in 8 weeks in IR otherwise.  Upon discharge drain will need to be flushed once daily with 5 ml sterile NS, output recorded and dressing changed every 1-2  days; maintain binder over area to minimize chances of dislodgment; call [phone] or [phone] with any drain related questions   Electronically Signed: D. Rowe Robert, PA-C 09/29/2019, 10:03 AM   I spent a total of 15 minutes at the the patient's bedside AND on the patient's hospital floor or unit, greater than 50% of which was counseling/coordinating care for gallbladder drain    Patient ID: Derrick [name], male   DOB: 05/18/1932, 85 y.o.   MRN: [id number]

## 2019-09-29 NOTE — TOC Progression Note (Signed)
Transition of Care [facility]) - Progression Note    Patient Details  Name: Derrick [name] MRN: [id number] Date of Birth: [birth year]  Transition of Care [facility]) CM/SW Contact  Yahel Fuston, Juliann Pulse, RN Phone Number: 09/29/2019, 2:32 PM  Clinical Narrative:   Son chose [employer] following-currently has perc drain, getting iv abx-please confirm if long term iv abx needed @ SNF.covid results within 48hrs of discharge.    Expected Discharge Plan: [place] Barriers to Discharge: Continued Medical Work up  Expected Discharge Plan and Services Expected Discharge Plan: [place]   Discharge Planning Services: CM Consult Post Acute Care Choice: [place] Living arrangements for the past 2 months: Single Family Home                                       Social Determinants of Health (SDOH) Interventions    Readmission Risk Interventions Readmission Risk Prevention Plan 09/22/2019  Transportation Screening Complete  Medication Review Press photographer) Complete  PCP or Specialist appointment within 3-5 days of discharge Complete  HRI or Home Care Consult Complete  SW Recovery Care/Counseling Consult Complete  Palliative Care Screening Not [place] Complete  Some recent data might be hidden

## 2019-09-29 NOTE — Progress Notes (Addendum)
PROGRESS NOTE    Derrick [name]  HCW:[id number]  DOB: 03-31-1932  PCP: [name], MD Admit date:09/16/2019  85 y.o.malewith medical history significant ofRA on prednisone and hydroxychloroquine, CAD and PAD, HTN referred to [facility] ED by his PCP after he presented with with c/o periumbilical to lower abd pain, onset 3 days PTA and associated with N/V. Denied change in BM or urinary symptoms. ED Course: In ED, patient was found to be septic with WBC 17 K, temp 101.6 F, heart rate 134. Acute transaminitis, COVID-19 negative. CT abdomen pelvis worrisome for acute cholecystitis. Hospital course: Patient admitted to [facility] for further management with GI/GS consultation.Patient admitted with IV antibiotics for sepsis, cholecystostomy tube placed by IR on 12/11 with bilious drainage. Gen surgery recommended follow-up with Dr. Marlou Starks in 6 weeks for interval cholecystectomy.Hospitalization complicated by positive blood cultures, delirium with underlying dementia and post operative A fib with RVR. Will need skilled nursing facility at discharge. Subjective:  Patient sitting in bedside chair and talking on the phone to grandchild.  Wife at bedside. Tolerating diet well. Abdominal drain with bloody drainage. Abdominal binder in place. Video-sitter in room.  According to nurse tends to get confused at night  Objective: Vitals:   09/28/19 2057 09/29/19 0514 09/29/19 1236 09/29/19 1406  BP: 114/68 117/70 113/71 114/80  Pulse: 61 76 78 84  Resp: '18 18  18  ' Temp: 98 F (36.7 C) 98.1 F (36.7 C)  97.7 F (36.5 C)  TempSrc: Oral   Oral  SpO2: 96% 97%  98%  Weight:      Height:        Intake/Output Summary (Last 24 hours) at 09/29/2019 1738 Last data filed at 09/29/2019 1400 Gross per 24 hour  Intake 1051.01 ml  Output 875 ml  Net 176.01 ml   Filed Weights   09/22/19 0528 09/23/19 0637 09/28/19 0500  Weight: 77.6 kg 79.4 kg 82.1 kg    Physical Examination:  General exam: Appears calm and  comfortable  Respiratory system: Clear to auscultation. Respiratory effort normal. Cardiovascular system: S1 & S2 heard, RRR. No JVD, murmurs, rubs, gallops or clicks. No pedal edema. Gastrointestinal system: RUQ Abdominal drain with blood drainage. Abdominal binder in place,nondistended, soft and nontender. Normal bowel sounds heard. Central nervous system: Alert and oriented. No new focal neurological deficits. Extremities: No contractures, edema or joint deformities.  Skin: No rashes, lesions or ulcers Psychiatry: Judgement and insight appear normal currently but waxing and waning. Mood & affect cheerful  Data Reviewed: I have personally reviewed following labs and imaging studies  CBC: Recent Labs  Lab 09/23/19 0500 09/26/19 0420 09/27/19 0934 09/28/19 0517  WBC 13.8* 15.0* 13.9* 11.2*  NEUTROABS 10.8*  --   --   --   HGB 10.0* 10.4* 9.6* 8.7*  HCT 32.6* 34.6* 31.3* 29.4*  MCV 84.2 85.0 85.1 86.2  PLT 290 366 306 176   Basic Metabolic Panel: Recent Labs  Lab 09/23/19 0500 09/26/19 0420 09/27/19 0934 09/28/19 0517  NA 141 139 139 137  K 3.8 4.1 4.0 3.9  CL 110 108 107 107  CO2 22 21* 25 21*  GLUCOSE 97 103* 116* 94  BUN 11 21 26* 22  CREATININE 0.94 1.16 1.30* 1.31*  CALCIUM 8.6* 9.0 8.8* 8.7*  MG 1.9  --   --   --    GFR: Estimated Creatinine Clearance: 45.3 mL/min (A) (by C-G formula based on SCr of 1.31 mg/dL (H)). Liver Function Tests: Recent Labs  Lab 09/23/19 0500  09/26/19 0420 09/27/19 0934 09/28/19 0517  AST 39 46* 40 24  ALT 67* 111* 99* 74*  ALKPHOS 64 59 81 61  BILITOT 0.8 0.8 0.7 0.6  PROT 5.7* 5.9* 5.3* 5.2*  ALBUMIN 2.6* 2.9* 2.5* 2.4*   No results for input(s): LIPASE, AMYLASE in the last 168 hours. No results for input(s): AMMONIA in the last 168 hours. Coagulation Profile: No results for input(s): INR, PROTIME in the last 168 hours. Cardiac Enzymes: No results for input(s): CKTOTAL, CKMB, CKMBINDEX, TROPONINI in the last 168  hours. BNP (last 3 results) No results for input(s): PROBNP in the last 8760 hours. HbA1C: No results for input(s): HGBA1C in the last 72 hours. CBG: Recent Labs  Lab 09/28/19 1151 09/28/19 1632 09/28/19 2052 09/29/19 0744 09/29/19 1137  GLUCAP 131* 116* 116* 91 127*   Lipid Profile: No results for input(s): CHOL, HDL, LDLCALC, TRIG, CHOLHDL, LDLDIRECT in the last 72 hours. Thyroid Function Tests: No results for input(s): TSH, T4TOTAL, FREET4, T3FREE, THYROIDAB in the last 72 hours. Anemia Panel: No results for input(s): VITAMINB12, FOLATE, FERRITIN, TIBC, IRON, RETICCTPCT in the last 72 hours. Sepsis Labs: No results for input(s): PROCALCITON, LATICACIDVEN in the last 168 hours.  No results found for this or any previous visit (from the past 240 hour(s)).    Radiology Studies: No results found.      Scheduled Meds: . aspirin  81 mg Oral Daily  . atorvastatin  40 mg Oral Daily  . clopidogrel  75 mg Oral Daily  . diltiazem  60 mg Oral Q6H  . docusate sodium  250 mg Oral Daily  . enoxaparin (LOVENOX) injection  40 mg Subcutaneous Daily  . feeding supplement (ENSURE ENLIVE)  237 mL Oral BID BM  . feeding supplement (PRO-STAT SUGAR FREE 64)  30 mL Oral BID  . finasteride  5 mg Oral QPM  . insulin aspart  0-5 Units Subcutaneous QHS  . insulin aspart  0-9 Units Subcutaneous TID WC  . mouth rinse  15 mL Mouth Rinse BID  . multivitamin with minerals  1 tablet Oral Daily  . omega-3 acid ethyl esters  1 g Oral Daily  . pantoprazole  40 mg Oral BID  . predniSONE  5 mg Oral Q breakfast  . QUEtiapine  25 mg Oral Q24H  . sertraline  25 mg Oral Daily  . sodium chloride flush  5 mL Intracatheter Q8H   Continuous Infusions: . sodium chloride Stopped (09/22/19 2342)    Assessment & Plan: Sepsis secondary to acute cholecystitis and E. coli bacteremia-Patient met sepsis criteria at the time of admission with fevers, leukocytosis, tachycardia, source likely due to acute  cholecystitis.CT abdomen pelvis showed acute cholecystitis, patient also had transaminitis, rapidly progressive encephalopathy on admission.Blood cultureswerepositive for E. coli. Patient admitted with empiric IV antibiotics (Zosyn). Now s/p cholecystostomy tubeplacement by IR.Continue IV Zosyn per sensitivities, received 12 days of abx and discontinued today.Patient has been transitioned from stress dose IV Solu-Cortef to oral prednisone, now on maintenance dose.(Patient on Plaquenil and chronic steroids for RA). GI recommended to continue current Rx with percutaneous tube which was replaced on 12/20 due to dislodgment. Surgery following, will need outpatient follow-up with Dr. Marlou Starks in 6 weeks for possible laparoscopic cholecystectomy, if not a candidate-he will be scheduled for routine drain exchange in 8 weeks by IR.  Leukocytosis downtrending although hard to assess in the setting of steroids.    Acute on chronic anemia: Baseline hemoglobin appears to be around 10.  Check hemoglobin  in a.m. given bloody drainage in PCT bag and downtrending hemoglobin, on aspirin/Plavix.  Transfuse as needed  A. fib with RVR, paroxysmal-Patient was noted to be in atrial fibrillation with RVR post cholecystostomy tube on the evening of 12/11 and had to be placed on Cardizem drip.Heart rate now controlled, Cardizem drip has been off, currently tolerating oral Cardizem 60 mg every 6 hours.Continue Plavix for CAD/PAD but not on chronic blood thinners  Acute metabolic encephalopathy /Delrium: in the setting of infection and underlying mild dementia (per son).Head CT negative for acute abnormalities, showed mild chronic atrophy and chronic ischemic changes. Olanzapine was discontinued on 12/17, was placed on Seroquel 25 mg. Patient hasbeen doing well on Seroquel 25 mg, 8 PM daily (watch qtc).Patient has abdominal binder to help minimize pulling the tube,pulled PCT on 12/19, replaced back by IR  Rheumatoid arthritis-  Plaquenil and prednisone were placed on hold on admission as NPO and patient was started on IV stress dose steroidsIV Solu-Cortef now transitioned to oral prednisoneand tapered back to home dose 5 mg daily. Plan to restart Plaquenil outpatient once acute infection has resolved  Coronary disease/PAD-Currently no chest pain or shortness of breath, stable.Continue home regimen of aspirin, Lipitor, Plavix.Outpatient follow-up with cardiology post discharge for preop clearance for possible cholecystectomy in about 6 to 8 weeks  AKI: Patient serum creatinine had normalized 2.9 last week but been elevated since 12/20 at 1.3. Has not been eating well and has PCT output/fluid losses.  Will hydrate overnight.  BMP in a.m.  Essential hypertension-Continue Cardizem  GERD-Continue PPI  QT prolongation-EKG on 12/15 showed sinus rhythm, QTC 477--now improved to 452 ms,  -Avoid haldol, replace electrolytes as needed.   DVT prophylaxis: lovenox Code Status: Full code Family / Patient Communication: D/W patient and wife at bedside. Called and updated son  Disposition Plan: Patient accepted to skilled nursing facility for rehab, sent repeat Covid screen today.      LOS: 13 days    Time spent: 35 mins    Guilford Shi, MD [facility] Pager [phone]  If 7PM-7AM, please contact night-coverage [email or web address] Password [facility] 09/29/2019, 5:38 PM

## 2019-09-29 NOTE — Progress Notes (Signed)
Physical Therapy Treatment Patient Details Name: Derrick [name] MRN: [id number] DOB: 1932-09-10 Today's Date: 09/29/2019    History of Present Illness Pt admitted through ED 09/16/19 with abdominal pain.  Pt dx with sesis 2* ecoli bacteremia and acute cholecystitis.  Pt with hx of RA, memory loss, CKD, CAD, lumbar lami and R transmetatarsal amputation (18)    PT Comments    Pt OOB in recliner.  Assisted with amb in hallway.  General Gait Details: Assist to stabilize/support pt throughout ambulation distance. Cues for safety, proper distance from RW. Followed with recliner. Weak B knees which "give out" during stanmd to sit.  HIGH FALL RISK.Marland Kitchen  Assisted to bathroom.  General transfer comment: 25% VC's on proper hand placement to push up vs pull on walker.  Required increased assist to rise from lower commode level.   Pt will need ST Rehab at SNF prior to returning home.   Follow Up Recommendations  SNF     Equipment Recommendations  None recommended by PT    Recommendations for Other Services       Precautions / Restrictions Precautions Precautions: Fall Precaution Comments: drain R abdomen Required Braces or Orthoses: Other Brace Other Brace: ABD binder Restrictions Weight Bearing Restrictions: No    Mobility  Bed Mobility               General bed mobility comments: OOB in recliner  Transfers Overall transfer level: Needs assistance Equipment used: Rolling walker (2 wheeled) Transfers: Sit to/from Stand Sit to Stand: Min assist         General transfer comment: 25% VC's on proper hand placement to push up vs pull on walker  Ambulation/Gait Ambulation/Gait assistance: Min assist Gait Distance (Feet): 35 Feet Assistive device: Rolling walker (2 wheeled) Gait Pattern/deviations: Trunk flexed;Decreased stride length;Step-through pattern Gait velocity: decreased   General Gait Details: Assist to stabilize/support pt throughout ambulation distance. Cues for  safety, proper distance from RW. Followed with recliner. Weak B knees which "give out" during stanmd to sit.  HIGH FALL RISK.   Stairs             Wheelchair Mobility    Modified Rankin (Stroke Patients Only)       Balance                                            Cognition Arousal/Alertness: Awake/alert Behavior During Therapy: WFL for tasks assessed/performed Overall Cognitive Status: Within Functional Limits for tasks assessed                                 General Comments: very pleasant      Exercises      General Comments        Pertinent Vitals/Pain Pain Assessment: Faces Faces Pain Scale: Hurts a little bit Pain Location: abdomen Pain Descriptors / Indicators: Sore;Grimacing;Guarding Pain Intervention(s): Monitored during session    Home Living Family/patient expects to be discharged to:: Skilled nursing facility                    Prior Function            PT Goals (current goals can now be found in the care plan section) Progress towards PT goals: Progressing toward goals    Frequency    Min  2X/week      PT Plan Current plan remains appropriate    Co-evaluation              AM-PAC PT "6 Clicks" Mobility   Outcome Measure  Help needed turning from your back to your side while in a flat bed without using bedrails?: A Little Help needed moving from lying on your back to sitting on the side of a flat bed without using bedrails?: A Little Help needed moving to and from a bed to a chair (including a wheelchair)?: A Lot Help needed standing up from a chair using your arms (e.g., wheelchair or bedside chair)?: A Lot Help needed to walk in hospital room?: A Lot Help needed climbing 3-5 steps with a railing? : Total 6 Click Score: 13    End of Session Equipment Utilized During Treatment: Gait belt Activity Tolerance: Patient limited by fatigue Patient left: in chair;with call bell/phone  within reach;with chair alarm set Nurse Communication: Mobility status PT Visit Diagnosis: Unsteadiness on feet (R26.81);Muscle weakness (generalized) (M62.81);Difficulty in walking, not elsewhere classified (R26.2);Pain     Time: [id number] PT Time Calculation (min) (ACUTE ONLY): 23 min  Charges:  $Gait Training: 8-22 mins $Therapeutic Activity: 8-22 mins                     Rica Koyanagi  PTA Acute  Rehabilitation Services Pager      [phone] Office      [phone]

## 2019-09-29 NOTE — Plan of Care (Signed)
Patient calm and cooperative overnight, was able to utilize the urinal. Initiated Q2H toileting schedule for patient comfort as condom cath was self removed twice.

## 2019-09-29 NOTE — TOC Progression Note (Signed)
Transition of Care [facility]) - Progression Note    Patient Details  Name: Derrick [name] MRN: [id number] Date of Birth: Apr 15, 1932  [facility] of [facility]) CM/SW Contact  Amando Chaput, Juliann Pulse, RN Phone Number: 09/29/2019, 12:37 PM  Clinical Narrative: [employer] chosen by son [name]. Will have percutaneous drain @ discharge. Please confirm if long term iv abx needed @ discharge.     Expected Discharge Plan: [place] Barriers to Discharge: Continued Medical Work up  Expected Discharge Plan and Services Expected Discharge Plan: [place]   Discharge Planning Services: CM Consult Post Acute Care Choice: [place] Living arrangements for the past 2 months: Single Family Home                                       Social Determinants of Health (SDOH) Interventions    Readmission Risk Interventions Readmission Risk Prevention Plan 09/22/2019  Transportation Screening Complete  Medication Review [employer]) Complete  PCP or Specialist appointment within 3-5 days of discharge Complete  HRI or Home Care Consult Complete  SW Recovery Care/Counseling Consult Complete  Palliative Care Screening Not [place] Complete  Some recent data might be hidden

## 2019-09-30 DIAGNOSIS — Z743 Need for continuous supervision: Secondary | ICD-10-CM | POA: Diagnosis not present

## 2019-09-30 DIAGNOSIS — I2510 Atherosclerotic heart disease of native coronary artery without angina pectoris: Secondary | ICD-10-CM | POA: Diagnosis not present

## 2019-09-30 DIAGNOSIS — D638 Anemia in other chronic diseases classified elsewhere: Secondary | ICD-10-CM | POA: Diagnosis not present

## 2019-09-30 DIAGNOSIS — S98211D Complete traumatic amputation of two or more right lesser toes, subsequent encounter: Secondary | ICD-10-CM | POA: Diagnosis not present

## 2019-09-30 DIAGNOSIS — N183 Chronic kidney disease, stage 3 unspecified: Secondary | ICD-10-CM | POA: Diagnosis not present

## 2019-09-30 DIAGNOSIS — M2550 Pain in unspecified joint: Secondary | ICD-10-CM | POA: Diagnosis not present

## 2019-09-30 DIAGNOSIS — I4891 Unspecified atrial fibrillation: Secondary | ICD-10-CM | POA: Diagnosis not present

## 2019-09-30 DIAGNOSIS — G4700 Insomnia, unspecified: Secondary | ICD-10-CM | POA: Diagnosis not present

## 2019-09-30 DIAGNOSIS — Z7401 Bed confinement status: Secondary | ICD-10-CM | POA: Diagnosis not present

## 2019-09-30 DIAGNOSIS — A4151 Sepsis due to Escherichia coli [E. coli]: Secondary | ICD-10-CM | POA: Diagnosis not present

## 2019-09-30 DIAGNOSIS — B9620 Unspecified Escherichia coli [E. coli] as the cause of diseases classified elsewhere: Secondary | ICD-10-CM | POA: Diagnosis not present

## 2019-09-30 DIAGNOSIS — W19XXXA Unspecified fall, initial encounter: Secondary | ICD-10-CM | POA: Diagnosis not present

## 2019-09-30 DIAGNOSIS — M069 Rheumatoid arthritis, unspecified: Secondary | ICD-10-CM | POA: Diagnosis not present

## 2019-09-30 DIAGNOSIS — Z978 Presence of other specified devices: Secondary | ICD-10-CM | POA: Diagnosis not present

## 2019-09-30 DIAGNOSIS — I480 Paroxysmal atrial fibrillation: Secondary | ICD-10-CM | POA: Diagnosis not present

## 2019-09-30 DIAGNOSIS — I10 Essential (primary) hypertension: Secondary | ICD-10-CM | POA: Diagnosis not present

## 2019-09-30 DIAGNOSIS — M869 Osteomyelitis, unspecified: Secondary | ICD-10-CM | POA: Diagnosis not present

## 2019-09-30 DIAGNOSIS — R410 Disorientation, unspecified: Secondary | ICD-10-CM | POA: Diagnosis not present

## 2019-09-30 DIAGNOSIS — I739 Peripheral vascular disease, unspecified: Secondary | ICD-10-CM | POA: Diagnosis not present

## 2019-09-30 DIAGNOSIS — R7881 Bacteremia: Secondary | ICD-10-CM | POA: Diagnosis not present

## 2019-09-30 DIAGNOSIS — R109 Unspecified abdominal pain: Secondary | ICD-10-CM | POA: Diagnosis not present

## 2019-09-30 DIAGNOSIS — R2689 Other abnormalities of gait and mobility: Secondary | ICD-10-CM | POA: Diagnosis not present

## 2019-09-30 DIAGNOSIS — K5900 Constipation, unspecified: Secondary | ICD-10-CM | POA: Diagnosis not present

## 2019-09-30 DIAGNOSIS — K219 Gastro-esophageal reflux disease without esophagitis: Secondary | ICD-10-CM | POA: Diagnosis not present

## 2019-09-30 DIAGNOSIS — M6281 Muscle weakness (generalized): Secondary | ICD-10-CM | POA: Diagnosis not present

## 2019-09-30 DIAGNOSIS — K810 Acute cholecystitis: Secondary | ICD-10-CM | POA: Diagnosis not present

## 2019-09-30 DIAGNOSIS — R5381 Other malaise: Secondary | ICD-10-CM | POA: Diagnosis not present

## 2019-09-30 DIAGNOSIS — E785 Hyperlipidemia, unspecified: Secondary | ICD-10-CM | POA: Diagnosis not present

## 2019-09-30 DIAGNOSIS — G9341 Metabolic encephalopathy: Secondary | ICD-10-CM | POA: Diagnosis not present

## 2019-09-30 DIAGNOSIS — R1084 Generalized abdominal pain: Secondary | ICD-10-CM | POA: Diagnosis not present

## 2019-09-30 LAB — CBC
HCT: 28.6 % — ABNORMAL LOW (ref 39.0–52.0)
Hemoglobin: 8.6 g/dL — ABNORMAL LOW (ref 13.0–17.0)
MCH: 26 pg (ref 26.0–34.0)
MCHC: 30.1 g/dL (ref 30.0–36.0)
MCV: 86.4 fL (ref 80.0–100.0)
Platelets: 299 10*3/uL (ref 150–400)
RBC: 3.31 MIL/uL — ABNORMAL LOW (ref 4.22–5.81)
RDW: 16.7 % — ABNORMAL HIGH (ref 11.5–15.5)
WBC: 8.9 10*3/uL (ref 4.0–10.5)
nRBC: 0 % (ref 0.0–0.2)

## 2019-09-30 LAB — BASIC METABOLIC PANEL
Anion gap: 8 (ref 5–15)
BUN: 13 mg/dL (ref 8–23)
CO2: 21 mmol/L — ABNORMAL LOW (ref 22–32)
Calcium: 8.7 mg/dL — ABNORMAL LOW (ref 8.9–10.3)
Chloride: 110 mmol/L (ref 98–111)
Creatinine, Ser: 1.11 mg/dL (ref 0.61–1.24)
GFR calc Af Amer: >60 mL/min (ref >60)
GFR calc non Af Amer: >60 mL/min (ref >60)
Glucose, Bld: 95 mg/dL (ref 70–99)
Potassium: 3.7 mmol/L (ref 3.5–5.1)
Sodium: 139 mmol/L (ref 135–145)

## 2019-09-30 LAB — GLUCOSE, CAPILLARY
Glucose-Capillary: 104 mg/dL — ABNORMAL HIGH (ref 70–99)
Glucose-Capillary: 96 mg/dL (ref 70–99)
Glucose-Capillary: 119 mg/dL — ABNORMAL HIGH (ref 70–99)

## 2019-09-30 LAB — SARS CORONAVIRUS 2 (TAT 6-24 HRS): SARS Coronavirus 2: NEGATIVE

## 2019-09-30 MED ORDER — DILTIAZEM HCL 60 MG PO TABS: 60.0000 mg | ORAL_TABLET | Freq: Four times a day (QID) | ORAL | Status: DC

## 2019-09-30 MED ORDER — ACETAMINOPHEN 325 MG PO TABS: 650.0000 mg | ORAL_TABLET | Freq: Four times a day (QID) | ORAL | Status: DC | PRN

## 2019-09-30 MED ORDER — PRO-STAT SUGAR FREE PO LIQD: 30.0000 mL | Freq: Two times a day (BID) | ORAL | 0 refills | Status: DC

## 2019-09-30 MED ORDER — ENSURE ENLIVE PO LIQD: 237.0000 mL | Freq: Two times a day (BID) | ORAL | 12 refills | Status: DC

## 2019-09-30 MED ORDER — ONDANSETRON HCL 4 MG PO TABS: 4.0000 mg | ORAL_TABLET | Freq: Four times a day (QID) | ORAL | 0 refills | Status: DC | PRN

## 2019-09-30 MED ORDER — QUETIAPINE FUMARATE 25 MG PO TABS: 25.0000 mg | ORAL_TABLET | ORAL | Status: DC

## 2019-09-30 NOTE — TOC Progression Note (Signed)
Transition of Care [facility]) - Progression Note    Patient Details  Name: AASHAY SAVIGNANO MRN: [id number] Date of Birth: [birth year]  [facility]) CM/SW Contact  Purcell Mouton, RN Phone Number: 09/30/2019, 11:02 AM  Clinical Narrative: [facility] transportation was called for 2PM pickup.    Expected Discharge Plan: [place] Barriers to Discharge: Continued Medical Work up  Expected Discharge Plan and Services Expected Discharge Plan: [place]   Discharge Planning Services: CM Consult Post Acute Care Choice: [place] Living arrangements for the past 2 months: Single Family Home Expected Discharge Date: 09/30/19                                     Social Determinants of Health (SDOH) Interventions    Readmission Risk Interventions Readmission Risk Prevention Plan 09/22/2019  Transportation Screening Complete  Medication Review [employer] photographer) Complete  PCP or Specialist appointment within 3-5 days of discharge Complete  HRI or Home Care Consult Complete  SW Recovery Care/Counseling Consult Complete  Palliative Care Screening Not [place] Complete  Some recent data might be hidden

## 2019-09-30 NOTE — Discharge Summary (Addendum)
Physician Discharge Summary  Derrick [name] PIR:[id number] DOB: 1932-07-17 DOA: 09/16/2019  PCP: [name], MD  Admit date: 09/16/2019 Discharge date: 09/30/2019 Consultations: Surgery and Interventional radiology Admitted From: home Disposition: SNF  Discharge Diagnoses:  Principal Problem:   Sepsis ([place]) Active Problems:   HTN (hypertension)   GERD (gastroesophageal reflux disease)   Stage 3 chronic kidney disease   CAD (coronary artery disease)   Osteomyelitis ([place])   Acute metabolic encephalopathy   A-fib ([facility])   Transaminitis   Nausea and vomiting   Rheumatoid arthritis ([place])   E coli bacteremia   Acute cholecystitis   Atrial fibrillation with RVR [facility])   Hospital Course Summary: 85 y.o.malewith medical history significant ofRA on prednisone and hydroxychloroquine, CAD and PAD, [facility]referredto [facility] by his PCP after he presented withwith c/o periumbilical to lower abd pain, onset 3 daysPTA and associated with N/V.Deniedchange in BM or urinary symptoms. ED Course:In ED, patient was found to be septic with WBC 17 K, temp 101.6 F, heart rate 134. Acute transaminitis, COVID-19 negative. CT abdomen pelvis worrisome for acute cholecystitis. Hospital course: Patient [facility] for[facility] management with GI/GSconsultation.Patient admitted with IV antibiotics for sepsis, cholecystostomy tube placed by IR on 12/11 with bilious drainage.Gensurgery recommended follow-up with Dr. Marlou Starks in 6 weeksfor possible intervalcholecystectomy.Hospitalization complicatedby positive blood cultures,delirium withunderlying dementiaand post operative A fib with RVR. Will need skilled nursing facility at discharge.  Sepsis secondary to acute cholecystitis and E. coli bacteremia-Patient met sepsis criteria at the time of admission with fevers, leukocytosis, tachycardia.CT abdomen pelvis showed acute cholecystitis, patient also had transaminitis, rapidly progressive  encephalopathy on admission.Blood cultureswerepositive for E. coli. Patient admitted withempiric IV antibiotics (Zosyn) and stress dose steroids given sepsis and chronic prednisone use.Now s/pcholecystostomy tubeplacement byIR. Patient recieved IV Zosyn per sensitivities, received 12 days of abx and discontinued on 12/22.Patient has been transitioned from stress doseIV Solu-Cortef to oral prednisone, now on maintenance dose.(Patient on Plaquenil and chronic steroids for RA).GI recommended to continue current Rx with percutaneous tube which was replaced by IR on 12/20 due to dislodgment. Per GS, patient will need outpatient follow-up with Dr. Marlou Starks in 6 weeks for possible laparoscopic cholecystectomy, if not a candidate-he will be scheduled for routine drain exchange in 8 weeks by IR.  Leukocytosis downtrending although hard to assess in the setting of steroids. Per IR f/u today --Upon discharge drain will need to be flushed once daily with 5 ml sterile NS, output recorded and dressing changed every 1-2 days; maintain binder over area to minimize chances of dislodgment; call [phone] or [phone] with any drain related issues.  Acute on chronic anemia: Baseline hemoglobin appears to be around 10. Patient has somewhat bloody drainage in PCT bag and on aspirin/Plavix. Hgb dropped but overall stable in last 2-3 days (10.4-->9.6-->8.7-->9.0-->8.6). Advise to monitor closely at SNF and transfuse as needed. May need to hold antiplatelet agents if has significant drop.  A. fib with RVR, paroxysmal-Patient was noted to be in atrial fibrillation with RVR post cholecystostomy tube on the evening of 12/11 and had to be placed on Cardizem drip.Heart rate now controlled, Cardizem drip has been off, currently tolerating oral Cardizem 60 mg every 6 hours.Continue Plavix for CAD/PAD but not on chronic blood thinners  Acute metabolic encephalopathy/[place]: in the setting of infection  andunderlyingmilddementia(per son).Head CT negative for acute abnormalities, showed mild chronic atrophy and chronic ischemic changes. Olanzapine was discontinued on 12/17, was placed on Seroquel 25 mg. Patient hasbeen doing well on Seroquel 25 mg,  8 PM daily--this can likely be discontinued in 5 days if remains stable at SNF as mental status back to his baseline now.Patient has abdominal binder to help minimize pulling the tube,pulled PCT on 12/19, replaced back by IR. Fortunately he has not required any opiates for pain in last few days.   Rheumatoid arthritis-Plaquenil and prednisone were placed on hold on admission as NPOand patient was started on IV stress dose steroidsIV Solu-Cortef now transitioned to oral prednisoneand tapered back to home dose 5 mg daily. Plan to restart Plaquenil outpatient as acute infection has resolved, completed abx course.  Coronary disease/PAD-Currently no chest pain or shortness of breath, stable.Continue home regimen of aspirin, Lipitor, Plavix.Outpatient follow-up with cardiology post discharge for preop clearance for possible cholecystectomy in about 6 to 8 weeks  AKI: Patient serum creatinine had normalized 2.9 last week but been elevated since 12/20 at 1.3. Has not been eating well and has PCT output/fluid losses.  Will hydrate overnight.  BMP in a.m.  Essential hypertension-Continue Cardizem  GERD-Continue PPI  QT prolongation-EKG on 12/15 showed sinus rhythm, QTC 477--now improved to 452 ms,  -Avoid haldol, replace electrolytes as needed.  Discharge Exam:  Vitals:   09/30/19 0500 09/30/19 0838  BP: 116/74 118/77  Pulse: 82 83  Resp: 18   Temp: 98 F (36.7 C)   SpO2: 100%    Vitals:   09/29/19 1406 09/29/19 2100 09/30/19 0500 09/30/19 0838  BP: 114/80 120/80 116/74 118/77  Pulse: 84 80 82 83  Resp: '18 17 18   ' Temp: 97.7 F (36.5 C) 97.9 F (36.6 C) 98 F (36.7 C)   TempSrc: Oral Oral Oral   SpO2: 98% 98% 100%   Weight:       Height:        General: Pt is alert, awake, not in acute distress Cardiovascular: RRR, S1/S2 +, no rubs, no gallops Respiratory: CTA bilaterally, no wheezing, no rhonchi Abdominal: s/p PCT Soft, NT, ND, bowel sounds + Extremities: no edema, no cyanosis  Discharge Condition:Stable CODE STATUS: Full code Diet recommendation: Heart healthy Recommendations for Outpatient Follow-up:  1. Follow up with PCP: at SNF 2. Follow up with consultants: GS Dr Alice Rieger in 4-5 weeks, IR as needed, primary cardiologist in 1 week 3. Please obtain follow up labs including: CBC/BMP in 2-3 days   Discharge Instructions:  Discharge Instructions    Call MD for:   Complete by: As directed    Any issues with PCT /dislodgement   Call MD for:  difficulty breathing, headache or visual disturbances   Complete by: As directed    Call MD for:  extreme fatigue   Complete by: As directed    Call MD for:  persistant dizziness or light-headedness   Complete by: As directed    Call MD for:  persistant nausea and vomiting   Complete by: As directed    Call MD for:  severe uncontrolled pain   Complete by: As directed    Call MD for:  temperature >100.4   Complete by: As directed    Diet - low sodium heart healthy   Complete by: As directed    Increase activity slowly   Complete by: As directed      Allergies as of 09/30/2019      Reactions   Effexor [venlafaxine] Other (See Comments)   Sexual side affects   Lisinopril Cough   Paxil [paroxetine] Other (See Comments)   Sexual side affects   Viagra [sildenafil] Other (See Comments)  Chest discomfort   Wellbutrin [bupropion] Itching      Medication List    STOP taking these medications   amLODipine 5 MG tablet Commonly known as: NORVASC   HYDROcodone-acetaminophen 5-325 MG tablet Commonly known as: NORCO/VICODIN   traMADol 50 MG tablet Commonly known as: ULTRAM   traZODone 50 MG tablet Commonly known as: DESYREL     TAKE these medications    acetaminophen 325 MG tablet Commonly known as: TYLENOL Take 2 tablets (650 mg total) by mouth every 6 (six) hours as needed for mild pain (or Fever >/= 101). What changed:   medication strength  how much to take  reasons to take this   aspirin 81 MG chewable tablet Chew 81 mg by mouth daily.   atorvastatin 80 MG tablet Commonly known as: LIPITOR Take 40 mg by mouth daily.   clopidogrel 75 MG tablet Commonly known as: PLAVIX Take 75 mg by mouth daily.   diltiazem 60 MG tablet Commonly known as: CARDIZEM Take 1 tablet (60 mg total) by mouth every 6 (six) hours.   docusate sodium 250 MG capsule Commonly known as: COLACE Take 250 mg by mouth daily.   feeding supplement (ENSURE ENLIVE) Liqd Take 237 mLs by mouth 2 (two) times daily between meals.   feeding supplement (PRO-STAT SUGAR FREE 64) Liqd Take 30 mLs by mouth 2 (two) times daily.   finasteride 5 MG tablet Commonly known as: PROSCAR Take 5 mg by mouth every evening.   Fish Oil 1000 MG Caps Take 1,000 mg by mouth daily.   hydroxychloroquine 200 MG tablet Commonly known as: PLAQUENIL Take 200 mg by mouth daily.   multivitamin with minerals Tabs tablet Take 1 tablet by mouth every evening.   nitroGLYCERIN 0.3 MG SL tablet Commonly known as: NITROSTAT Place 0.3 mg under the tongue every 5 (five) minutes as needed for chest pain. What changed: Another medication with the same name was removed. Continue taking this medication, and follow the directions you see here.   ondansetron 4 MG tablet Commonly known as: ZOFRAN Take 1 tablet (4 mg total) by mouth every 6 (six) hours as needed for nausea.   pantoprazole 40 MG tablet Commonly known as: PROTONIX Take 40 mg by mouth 2 (two) times daily.   polyethylene glycol 17 g packet Commonly known as: MIRALAX / GLYCOLAX Take 17 g by mouth daily as needed for mild constipation.   predniSONE 5 MG tablet Commonly known as: DELTASONE Take 5 mg by mouth daily with  breakfast.   QUEtiapine 25 MG tablet Commonly known as: SEROQUEL Take 1 tablet (25 mg total) by mouth daily for 5 days.   sertraline 25 MG tablet Commonly known as: ZOLOFT Take 25 mg by mouth daily.       Contact information for follow-up providers    Jovita Kussmaul, MD Follow up on 11/05/2019.   Specialty: General Surgery Why: Arrive at 1:00 for 1:30pm appointment Contact information: [place] [address] [phone]            Contact information for after-discharge care    Destination    [facility] Preferred SNF .   Service: Skilled Nursing Contact information: [address] [address] [phone]                 Allergies  Allergen Reactions  . Effexor [Venlafaxine] Other (See Comments)    Sexual side affects  . Lisinopril Cough  . Paxil [Paroxetine]  Other (See Comments)    Sexual side affects  . Viagra [Sildenafil] Other (See Comments)    Chest discomfort  . Wellbutrin [Bupropion] Itching      The results of significant diagnostics from this hospitalization (including imaging, microbiology, ancillary and laboratory) are listed below for reference.    Labs: BNP (last 3 results) Recent Labs    12/12/18 0843  BNP 53.6   Basic Metabolic Panel: Recent Labs  Lab 09/26/19 0420 09/27/19 0934 09/28/19 0517 09/30/19 0533  NA 139 139 137 139  K 4.1 4.0 3.9 3.7  CL 108 107 107 110  CO2 21* 25 21* 21*  GLUCOSE 103* 116* 94 95  BUN 21 26* 22 13  CREATININE 1.16 1.30* 1.31* 1.11  CALCIUM 9.0 8.8* 8.7* 8.7*   Liver Function Tests: Recent Labs  Lab 09/26/19 0420 09/27/19 0934 09/28/19 0517  AST 46* 40 24  ALT 111* 99* 74*  ALKPHOS 59 81 61  BILITOT 0.8 0.7 0.6  PROT 5.9* 5.3* 5.2*  ALBUMIN 2.9* 2.5* 2.4*   No results for input(s): LIPASE, AMYLASE in the last 168 hours. No results for input(s): AMMONIA in the last 168 hours. CBC: Recent Labs  Lab 09/26/19 0420  09/27/19 0934 09/28/19 0517 09/29/19 1816 09/30/19 0533  WBC 15.0* 13.9* 11.2*  --  8.9  HGB 10.4* 9.6* 8.7* 9.0* 8.6*  HCT 34.6* 31.3* 29.4* 30.0* 28.6*  MCV 85.0 85.1 86.2  --  86.4  PLT 366 306 260  --  299   Cardiac Enzymes: No results for input(s): CKTOTAL, CKMB, CKMBINDEX, TROPONINI in the last 168 hours. BNP: Invalid input(s): POCBNP CBG: Recent Labs  Lab 09/29/19 0744 09/29/19 1137 09/29/19 1714 09/29/19 2016 09/30/19 0757  GLUCAP 91 127* 104* 112* 96   D-Dimer No results for input(s): DDIMER in the last 72 hours. Hgb A1c No results for input(s): HGBA1C in the last 72 hours. Lipid Profile No results for input(s): CHOL, HDL, LDLCALC, TRIG, CHOLHDL, LDLDIRECT in the last 72 hours. Thyroid function studies No results for input(s): TSH, T4TOTAL, T3FREE, THYROIDAB in the last 72 hours.  Invalid input(s): FREET3 Anemia work up No results for input(s): VITAMINB12, FOLATE, FERRITIN, TIBC, IRON, RETICCTPCT in the last 72 hours. Urinalysis    Component Value Date/Time   COLORURINE YELLOW 09/16/2019 1455   APPEARANCEUR CLEAR 09/16/2019 1455   LABSPEC 1.010 09/16/2019 1455   PHURINE 7.0 09/16/2019 1455   GLUCOSEU NEGATIVE 09/16/2019 1455   HGBUR SMALL (A) 09/16/2019 1455   BILIRUBINUR NEGATIVE 09/16/2019 1455   KETONESUR NEGATIVE 09/16/2019 1455   PROTEINUR 100 (A) 09/16/2019 1455   UROBILINOGEN 1.0 11/27/2013 0947   NITRITE NEGATIVE 09/16/2019 1455   LEUKOCYTESUR NEGATIVE 09/16/2019 1455   Sepsis Labs Invalid input(s): PROCALCITONIN,  WBC,  LACTICIDVEN Microbiology Recent Results (from the past 240 hour(s))  Aerobic/Anaerobic Culture (surgical/deep wound)     Status: None (Preliminary result)   Collection Time: 09/29/19 10:16 AM   Specimen: BILE  Result Value Ref Range Status   Specimen Description BILE GALL BLADDER  Final   Special Requests NONE  Final   Gram Stain   Final    NO WBC SEEN RARE GRAM NEGATIVE RODS Performed at [facility], [place] [address], [place], [place] [address]    Culture PENDING  Incomplete   Report Status PENDING  Incomplete  SARS CORONAVIRUS 2 (TAT 6-24 HRS) Nasopharyngeal Nasopharyngeal Swab     Status: None   Collection Time: 09/29/19  6:34 PM   Specimen: Nasopharyngeal  Swab  Result Value Ref Range Status   SARS Coronavirus 2 NEGATIVE NEGATIVE Final    Comment: (NOTE) SARS-CoV-2 target nucleic acids are NOT DETECTED. The SARS-CoV-2 RNA is generally detectable in upper and lower respiratory specimens during the acute phase of infection. Negative results do not preclude SARS-CoV-2 infection, do not rule out co-infections with other pathogens, and should not be used as the sole basis for treatment or other patient management decisions. Negative results must be combined with clinical observations, patient history, and epidemiological information. The expected result is Negative. Fact Sheet for Patients: SugarRoll.be Fact Sheet for Healthcare Providers: https://www.woods-mathews.com/ This test is not yet approved or cleared by the [place] FDA and  has been authorized for detection and/or diagnosis of SARS-CoV-2 by FDA under an Emergency Use Authorization (EUA). This EUA will remain  in effect (meaning this test can be used) for the duration of the COVID-19 declaration under Section 56 4(b)(1) of the Act, 21 U.S.C. section 360bbb-3(b)(1), unless the authorization is terminated or revoked sooner. Performed at [facility], [place] [address], [place], [place] [address]     Procedures/Studies: CT HEAD WO CONTRAST  Result Date: 09/17/2019 CLINICAL DATA:  Encephalopathy. EXAM: CT HEAD WITHOUT CONTRAST TECHNIQUE: Contiguous axial images were obtained from the base of the skull through the vertex without intravenous contrast. COMPARISON:  October 02, 2012. FINDINGS: Brain: Mild diffuse cortical atrophy is noted. Mild chronic ischemic white matter disease is  noted. No mass effect or midline shift is noted. Ventricular size is within normal limits. There is no evidence of mass lesion, hemorrhage or acute infarction. Vascular: No hyperdense vessel or unexpected calcification. Skull: Normal. Negative for fracture or focal lesion. Sinuses/Orbits: No acute finding. Other: None. IMPRESSION: Mild diffuse cortical atrophy. Mild chronic ischemic white matter disease. No acute intracranial abnormality seen. Electronically Signed   By: Marijo Conception M.D.   On: 09/17/2019 11:46   CT ABDOMEN PELVIS W CONTRAST  Result Date: 09/16/2019 CLINICAL DATA:  Acute abdominal pain, generalized. EXAM: CT ABDOMEN AND PELVIS WITH CONTRAST TECHNIQUE: Multidetector CT imaging of the abdomen and pelvis was performed using the standard protocol following bolus administration of intravenous contrast. CONTRAST:  168m OMNIPAQUE IOHEXOL 300 MG/ML  SOLN COMPARISON:  10/03/2018 FINDINGS: Lower chest: Small subpleural lymph node in the right lower lobe stable since 2016. Signs of scarring and mild subpleural reticulation, unchanged. No signs of consolidation or evidence of pleural effusion. Hepatobiliary: Liver is unremarkable. There is moderate distension of the gallbladder with signs of gallbladder wall thickening and pericholecystic stranding. Wall thickness difficult to read on CT but is much as 4 mm despite the distended appearance of the gallbladder. There is no intra or extrahepatic biliary ductal distension. Pancreas: Unremarkable. No pancreatic ductal dilatation or surrounding inflammatory changes. Spleen: Normal in size without focal abnormality. Adrenals/Urinary Tract: Signs of right nephrectomy with stable small area of soft tissue nodularity along the ligated right renal artery measuring approximately 1.5 cm this has been present since 2007. Adrenal glands are normal. Small presumed cysts in the left kidney are unchanged. Stomach/Bowel: No signs of acute gastrointestinal process. Normal  appendix. Vascular/Lymphatic: Calcified atherosclerotic changes of the abdominal aorta up to 3 x 2.7 cm in greatest axial dimension. No signs of adenopathy in the upper abdomen or retroperitoneum. No signs of pelvic adenopathy. Vascular disease extends into branch vessels throughout the abdomen and pelvis. Reproductive: Prostatomegaly. No sign of pelvic adenopathy. Other: No signs of free air. No abscess. Musculoskeletal: Spinal degenerative changes  with signs of lower lumbar laminectomy. Grade 1 anterolisthesis of L5 on S1. Finding similar to prior study. Also mild anterolisthesis of L4 on L5. IMPRESSION: 1. CT findings of acute cholecystitis. 2. Signs of right nephrectomy with stable small area of soft tissue nodularity along the ligated right renal artery. Unchanged since 2007. 3. Aortic atherosclerosis. 4. Prostatomegaly. Aortic Atherosclerosis (ICD10-I70.0). Electronically Signed   By: Zetta Bills M.D.   On: 09/16/2019 12:39   IR Catheter Tube Change  Result Date: 09/27/2019 INDICATION: 85 year old male with history of acute calculus cholecystitis status post placement of a percutaneous cholecystostomy tube on 09/18/2019. Earlier today, the patient inadvertently pulled on his tube displacing it by approximately 6 inches in resulting in significant bleeding from the tube entry site. The tube is now clotted off with blood. Patient presents for tube check and likely exchange. EXAM: IR CATHETER TUBE CHANGE MEDICATIONS: None ANESTHESIA/SEDATION: None FLUOROSCOPY TIME:  Fluoroscopy Time: 1 minutes 12 seconds (24.4 mGy). COMPLICATIONS: None immediate. PROCEDURE: Informed written consent was obtained from the patient after a thorough discussion of the procedural risks, benefits and alternatives. All questions were addressed. Maximal Sterile Barrier Technique was utilized including caps, mask, sterile gowns, sterile gloves, sterile drape, hand hygiene and skin antiseptic. A timeout was performed prior to the  initiation of the procedure. A gentle hand injection of contrast material was performed. The tube has pulled back significantly but remains just within the gallbladder lumen. The tube was transected and carefully removed over a stiff Glidewire. A new Cook 10.2 [place] all-purpose drainage catheter was advanced over the wire and formed in the gallbladder lumen. A hand injection of contrast material was performed confirming the tube location. Images were obtained and stored for the medical record. Of note, the cystic duct is patent. IMPRESSION: 1. Successful rescue and exchange for a new 10.2 French percutaneous cholecystostomy tube. 2. The cystic duct is patent. PLAN: 1. Continue to follow-up with general surgery to determine if patient would be a candidate for elective cholecystectomy in the future. 2. Return to interventional Radiology in 8 weeks for routine percutaneous cholecystostomy tube exchange. Signed, Criselda Peaches, MD, [facility] Radiology Electronically Signed   By: Jacqulynn Cadet M.D.   On: 09/27/2019 09:02   NM Hepato W/EjeCT Fract  Result Date: 09/17/2019 CLINICAL DATA:  RIGHT upper quadrant pain with nausea and vomiting for 3 days EXAM: NUCLEAR MEDICINE HEPATOBILIARY IMAGING TECHNIQUE: Sequential images of the abdomen were obtained out to 60 minutes following intravenous administration of radiopharmaceutical. RADIOPHARMACEUTICALS:  5.35 mCi Tc-31m Choletec IV Pharmaceutical: Morphine sulfate 3 mg IV COMPARISON:  Ultrasound abdomen 09/16/2019 FINDINGS: Normal tracer extraction from bloodstream indicating normal hepatocellular function. Normal excretion of tracer into biliary tree. Small bowel visualized at 14 minutes. At 1 hour, gallbladder had not visualized. Patient then received morphine and imaging was continued for an additional 30 minutes. Gallbladder failed to visualize following morphine augmentation. Findings are consistent  with cystic duct obstruction and acute cholecystitis. IMPRESSION: Patent CBD. Nonvisualization of the gallbladder despite morphine augmentation consistent with cystic duct obstruction and acute cholecystitis. Electronically Signed   By: MLavonia DanaM.D.   On: 09/17/2019 14:36   UKoreaAbdomen Limited  Result Date: 09/16/2019 CLINICAL DATA:  Abdominal pain. CT abdomen and pelvis today demonstrated findings worrisome for cholecystitis. EXAM: ULTRASOUND ABDOMEN LIMITED RIGHT UPPER QUADRANT COMPARISON:  CT abdomen and pelvis earlier today. FINDINGS: Gallbladder: No gallstones or pericholecystic fluid. Sonographer reports negative Murphy's sign. The gallbladder is  mildly distended and there is minimal wall thickening at 0.4 cm. Common bile duct: Diameter: 0.8 cm Liver: No focal lesion. Echogenicity is increased. Portal vein is patent on color Doppler imaging with normal direction of blood flow towards the liver. Other: None. IMPRESSION: Negative for gallstones or acute cholecystitis. Fatty infiltration of the liver. Electronically Signed   By: Inge Rise M.D.   On: 09/16/2019 13:59   IR Perc Cholecystostomy  Result Date: 09/18/2019 CLINICAL DATA:  Acute cholecystitis, currently poor operative candidate EXAM: PERCUTANEOUS CHOLECYSTOSTOMY TUBE PLACEMENT WITH ULTRASOUND AND FLUOROSCOPIC GUIDANCE FLUOROSCOPY TIME:  0.5 minutes; 197 uGym2 DAP TECHNIQUE: The procedure, risks (including but not limited to bleeding, infection, organ damage ), benefits, and alternatives were explained to the patient and family. Questions regarding the procedure were encouraged and answered. The family understands and consents to the procedure. Survey ultrasound of the abdomen was performed and an appropriate skin entry site was identified. Skin site was marked, prepped with Betadine, and draped in usual sterile fashion, and infiltrated locally with 1% lidocaine. Intravenous Fentanyl 185mg and Versed 275mwere administered as conscious  sedation during continuous monitoring of the patient's level of consciousness and physiological / cardiorespiratory status by the radiology RN, with a total moderate sedation time of 10 minutes. Under real-time ultrasound guidance, gallbladder was accessed using a transhepatic approach with a 21-gauge needle. Ultrasound image documentation was saved. Bile returned through the hub. Needle was exchanged over a 018 guidewire for transitional dilator which allowed placement of 035 J wire. Over this, a 10.2 French pigtail catheter was advanced and formed centrally in the gallbladder lumen. Small contrast injection confirmed appropriate position. Catheter secured externally with 0 Prolene suture and placed external drain bag. Patient tolerated the procedure well. COMPLICATIONS: COMPLICATIONS none IMPRESSION: 1. Technically successful percutaneous cholecystostomy tube placement with ultrasound and fluoroscopic guidance. Electronically Signed   By: D Lucrezia Europe.D.   On: 09/18/2019 14:56   DG Chest Portable 1 View  Result Date: 09/16/2019 CLINICAL DATA:  Fever, dyspnea. EXAM: PORTABLE CHEST 1 VIEW COMPARISON:  Chest radiograph 12/12/2018 FINDINGS: The patient is significantly rotated to the right, limiting evaluation. Heart size within normal limits. No definite airspace consolidation within the lungs. No evidence of pleural effusion or pneumothorax. No acute bony abnormality. Overlying cardiac monitoring leads. IMPRESSION: Patient significantly rotated to the right, limiting evaluation. No definite airspace consolidation. Electronically Signed   By: KyKellie SimmeringO   On: 09/16/2019 15:16   DG Abd Portable 1V  Result Date: 09/20/2019 CLINICAL DATA:  Biliary drainage tube. EXAM: PORTABLE ABDOMEN - 1 VIEW COMPARISON:  September 18, 2019. FINDINGS: The bowel gas pattern is normal. Percutaneous drainage catheter is noted in right upper quadrant. No radio-opaque calculi or other significant radiographic abnormality are  seen. IMPRESSION: Percutaneous drainage catheter is noted in right upper quadrant. Whether or not catheter tip is positioned within the gallbladder lumen cannot be determined by this radiograph. Electronically Signed   By: JaMarijo Conception.D.   On: 09/20/2019 11:17   ECHOCARDIOGRAM COMPLETE  Result Date: 09/19/2019   ECHOCARDIOGRAM REPORT   Patient Name:   JODESSIE DELCARLOate of Exam: 09/19/2019 Medical Rec #:  [id number] Height:       72.0 in Accession #:    [id number]:       178.1 lb Date of Birth:  1220-Aug-1933BSA:          2.03 m Patient Age:    [age]  BP:           137/69 mmHg Patient Gender: M           HR:           94 bpm. Exam Location:  Inpatient Procedure: 2D Echo Indications:    Atrial Fibrillation 427.31 / I48.91  History:        Patient has prior history of Echocardiogram examinations, most                 recent 12/12/2018. CAD; Risk Factors:Hypertension. AKI                 Sepsis.  Sonographer:    Vikki Ports Turrentine Referring Phys: [id number] DANIEL V THOMPSON  Sonographer Comments: Suboptimal subcostal window due to patient body habitus. IMPRESSIONS  1. Left ventricular ejection fraction, by visual estimation, is 55 to 60%. The left ventricle has normal function. There is mildly increased left ventricular hypertrophy.  2. Left ventricular diastolic parameters are indeterminate.  3. The left ventricle has no regional wall motion abnormalities.  4. Global right ventricle has normal systolic function.The right ventricular size is normal. No increase in right ventricular wall thickness.  5. Left atrial size was moderately dilated.  6. Right atrial size was normal.  7. The mitral valve is normal in structure. Mild to moderate mitral valve regurgitation.  8. Very eccentric anterior directed MR jet. The degree of MR may be underestimated due to the eccentrity of the jet. The MV to AV VTI ratio is 0.6 suggesting mild MR.  9. The tricuspid valve is normal in structure. Tricuspid valve  regurgitation is trivial. 10. The aortic valve is tricuspid. Aortic valve regurgitation is not visualized. No evidence of aortic valve sclerosis or stenosis. 11. Pulmonic regurgitation is mild. 12. The pulmonic valve was not well visualized. Pulmonic valve regurgitation is mild. 13. The interatrial septum was not well visualized. FINDINGS  Left Ventricle: Left ventricular ejection fraction, by visual estimation, is 55 to 60%. The left ventricle has normal function. The left ventricle has no regional wall motion abnormalities. There is mildly increased left ventricular hypertrophy. Left ventricular diastolic parameters are indeterminate. Right Ventricle: The right ventricular size is normal. No increase in right ventricular wall thickness. Global RV systolic function is has normal systolic function. Left Atrium: Left atrial size was moderately dilated. Right Atrium: Right atrial size was normal in size Pericardium: There is no evidence of pericardial effusion. Mitral Valve: The mitral valve is normal in structure. Mild to moderate mitral valve regurgitation. Very eccentric anterior directed MR jet. The degree of MR may be underestimated due to the eccentrity of the jet. The MV to AV VTI ratio is 0.6 suggesting  mild MR. Tricuspid Valve: The tricuspid valve is normal in structure. Tricuspid valve regurgitation is trivial. Aortic Valve: The aortic valve is tricuspid. Aortic valve regurgitation is not visualized. The aortic valve is structurally normal, with no evidence of sclerosis or stenosis. Aortic valve mean gradient measures 7.3 mmHg. Aortic valve peak gradient measures 13.7 mmHg. Aortic valve area, by VTI measures 1.77 cm. Pulmonic Valve: The pulmonic valve was not well visualized. Pulmonic valve regurgitation is mild. Pulmonic regurgitation is mild. No evidence of pulmonic stenosis. Aorta: The aortic root is normal in size and structure. Pulmonary Artery: Indeterminate PASP, inadequate TR jet. Venous: The  inferior vena cava was not well visualized. IAS/Shunts: The interatrial septum was not well visualized.  LEFT VENTRICLE PLAX 2D LVIDd:         4.50  cm LVIDs:         3.30 cm LV PW:         1.20 cm LV IVS:        1.20 cm LVOT diam:     2.10 cm LV SV:         48 ml LV SV Index:   23.80 LVOT Area:     3.46 cm  RIGHT VENTRICLE RV S prime:     16.00 cm/s TAPSE (M-mode): 1.6 cm LEFT ATRIUM              Index       RIGHT ATRIUM           Index LA diam:        4.60 cm  2.27 cm/m  RA Area:     13.90 cm LA Vol (A2C):   103.0 ml 50.79 ml/m RA Volume:   27.40 ml  13.51 ml/m LA Vol (A4C):   52.3 ml  25.79 ml/m LA Biplane Vol: 78.9 ml  38.90 ml/m  AORTIC VALVE AV Area (Vmax):    1.72 cm AV Area (Vmean):   1.81 cm AV Area (VTI):     1.77 cm AV Vmax:           185.22 cm/s AV Vmean:          124.794 cm/s AV VTI:            0.352 m AV Peak Grad:      13.7 mmHg AV Mean Grad:      7.3 mmHg LVOT Vmax:         92.03 cm/s LVOT Vmean:        65.200 cm/s LVOT VTI:          0.180 m LVOT/AV VTI ratio: 0.51  AORTA Ao Root diam: 3.40 cm MITRAL VALVE                        TRICUSPID VALVE MV Area (PHT): 3.91 cm             TR Peak grad:   13.4 mmHg MV VTI:        0.21 m               TR Vmax:        183.00 cm/s MV PHT:        56.26 msec MV Decel Time: 194 msec             SHUNTS MV E velocity: 84.27 cm/s 103 cm/s  Systemic VTI:  0.18 m MV A velocity: 79.70 cm/s 70.3 cm/s Systemic Diam: 2.10 cm MV E/A ratio:  1.06       1.5  Carlyle Dolly MD Electronically signed by Carlyle Dolly MD Signature Date/Time: 09/19/2019/12:26:36 PM    Final      Time coordinating discharge: Over 30 minutes  SIGNED:   Guilford Shi, MD  [facility] 09/30/2019, 9:33 AM Pager : [phone]

## 2019-09-30 NOTE — Plan of Care (Signed)
Patient orientation and underlying dementia continues to be a barrier to providing education. Patient's orientation and decision making has Improved, and patient is now able to utilize the urinal without assistance or reminders from medical staff.

## 2019-09-30 NOTE — Care Management Important Message (Signed)
Important Message  Patient Details IM Letter given Cookie Ballantine RN Case Manager to present to the Patient Name: Derrick [name] MRN: [id number] Date of Birth: December 20, 1931   Medicare Important Message Given:  Yes     Kerin Salen 09/30/2019, 10:47 AM

## 2019-09-30 NOTE — Progress Notes (Addendum)
Referring Physician(s): [place]  Supervising Physician: Markus Daft  Patient Status:  [facility] - In-pt  Chief Complaint:  Abdominal pain/cholecystitis  Subjective: Pt doing fairly well this am; denies worsening abd pain,N/V  Allergies: Effexor [venlafaxine], Lisinopril, Paxil [paroxetine], Viagra [sildenafil], and Wellbutrin [bupropion]  Medications: Prior to Admission medications   Medication Sig Start Date End Date Taking? Authorizing Provider  acetaminophen (TYLENOL) 500 MG tablet Take 1,000 mg by mouth every 6 (six) hours as needed for moderate pain or headache.   Yes [provider]  amLODipine (NORVASC) 5 MG tablet Take 1 tablet (5 mg total) by mouth daily. 08/25/19  Yes Croitoru, Mihai, MD  aspirin 81 MG chewable tablet Chew 81 mg by mouth daily.   Yes [provider]  atorvastatin (LIPITOR) 80 MG tablet Take 40 mg by mouth daily.   Yes [provider]  clopidogrel (PLAVIX) 75 MG tablet Take 75 mg by mouth daily.   Yes [provider]  docusate sodium (COLACE) 250 MG capsule Take 250 mg by mouth daily.   Yes [provider]  finasteride (PROSCAR) 5 MG tablet Take 5 mg by mouth every evening.    Yes [provider]  hydroxychloroquine (PLAQUENIL) 200 MG tablet Take 200 mg by mouth daily.   Yes [provider]  Multiple Vitamin (MULTIVITAMIN WITH MINERALS) TABS tablet Take 1 tablet by mouth every evening.    Yes [provider]  nitroGLYCERIN (NITRODUR - DOSED IN MG/24 HR) 0.1 mg/hr patch Place 0.1 mg onto the skin daily. 08/14/19  Yes [provider]  nitroGLYCERIN (NITROSTAT) 0.3 MG SL tablet Place 0.3 mg under the tongue every 5 (five) minutes as needed for chest pain.   Yes [provider]  Omega-3 Fatty Acids (FISH OIL) 1000 MG CAPS Take 1,000 mg by mouth daily.   Yes [provider]  pantoprazole (PROTONIX) 40 MG tablet Take 40 mg by mouth 2 (two) times daily. 08/03/19  Yes  [provider]  polyethylene glycol (MIRALAX / GLYCOLAX) 17 g packet Take 17 g by mouth daily as needed for mild constipation.   Yes [provider]  predniSONE (DELTASONE) 5 MG tablet Take 5 mg by mouth daily with breakfast.   Yes [provider]  sertraline (ZOLOFT) 25 MG tablet Take 25 mg by mouth daily.   Yes [provider]  traMADol (ULTRAM) 50 MG tablet Take 50-100 mg by mouth every 6 (six) hours as needed for moderate pain.   Yes [provider]  traZODone (DESYREL) 50 MG tablet Take 50-100 mg by mouth at bedtime as needed for sleep. 08/24/19  Yes [provider]  acetaminophen (TYLENOL) 325 MG tablet Take 2 tablets (650 mg total) by mouth every 6 (six) hours as needed for mild pain (or Fever >/= 101). 09/30/19   Guilford Shi, MD  Amino Acids-Protein Hydrolys (FEEDING SUPPLEMENT, PRO-STAT SUGAR FREE 64,) LIQD Take 30 mLs by mouth 2 (two) times daily. 09/30/19   Guilford Shi, MD  diltiazem (CARDIZEM) 60 MG tablet Take 1 tablet (60 mg total) by mouth every 6 (six) hours. 09/30/19   Guilford Shi, MD  feeding supplement, ENSURE ENLIVE, (ENSURE ENLIVE) LIQD Take 237 mLs by mouth 2 (two) times daily between meals. 09/30/19   Guilford Shi, MD  HYDROcodone-acetaminophen (NORCO/VICODIN) 5-325 MG tablet Take 1 tablet by mouth every 6 (six) hours as needed for moderate pain ((score 4 to 6)). Patient not taking: Reported on 09/17/2019 12/13/18   Geradine Girt, DO  ondansetron (ZOFRAN) 4 MG tablet Take 1 tablet (4 mg total) by mouth every 6 (six) hours as needed for nausea. 09/30/19   Guilford Shi, MD  QUEtiapine (SEROQUEL) 25 MG tablet Take 1 tablet (25 mg total) by mouth daily for 5 days. 09/30/19 10/05/19  Guilford Shi, MD     Vital Signs: BP 118/77   Pulse 83   Temp 98 F (36.7 C) (Oral)   Resp 18   Ht 6' (1.829 m)   Wt 181 lb (82.1 kg)   SpO2 100%   BMI 24.55 kg/m   Physical Exam awake/alert; GB drain  intact, insertion site ok, mildly tender, output 150 ml blood-tinged bile; drain flushed without difficulty  Imaging: IR Catheter Tube Change  Result Date: 09/27/2019 INDICATION: 85 year old male with history of acute calculus cholecystitis status post placement of a percutaneous cholecystostomy tube on 09/18/2019. Earlier today, the patient inadvertently pulled on his tube displacing it by approximately 6 inches in resulting in significant bleeding from the tube entry site. The tube is now clotted off with blood. Patient presents for tube check and likely exchange. EXAM: IR CATHETER TUBE CHANGE MEDICATIONS: None ANESTHESIA/SEDATION: None FLUOROSCOPY TIME:  Fluoroscopy Time: 1 minutes 12 seconds (24.4 mGy). COMPLICATIONS: None immediate. PROCEDURE: Informed written consent was obtained from the patient after a thorough discussion of the procedural risks, benefits and alternatives. All questions were addressed. Maximal Sterile Barrier Technique was utilized including caps, mask, sterile gowns, sterile gloves, sterile drape, hand hygiene and skin antiseptic. A timeout was performed prior to the initiation of the procedure. A gentle hand injection of contrast material was performed. The tube has pulled back significantly but remains just within the gallbladder lumen. The tube was transected and carefully removed over a stiff Glidewire. A new Cook 10.2 [place] all-purpose drainage catheter was advanced over the wire and formed in the gallbladder lumen. A hand injection of contrast material was performed confirming the tube location. Images were obtained and stored for the medical record. Of note, the cystic duct is patent. IMPRESSION: 1. Successful rescue and exchange for a new 10.2 French percutaneous cholecystostomy tube. 2. The cystic duct is patent. PLAN: 1. Continue to follow-up with general surgery to determine if patient would be a candidate for elective cholecystectomy in the future. 2. Return to  interventional Radiology in 8 weeks for routine percutaneous cholecystostomy tube exchange. Signed, Criselda Peaches, MD, [facility] Radiology Electronically Signed   By: Jacqulynn Cadet M.D.   On: 09/27/2019 09:02    Labs:  CBC: Recent Labs    09/26/19 0420 09/27/19 0934 09/28/19 0517 09/29/19 1816 09/30/19 0533  WBC 15.0* 13.9* 11.2*  --  8.9  HGB 10.4* 9.6* 8.7* 9.0* 8.6*  HCT 34.6* 31.3* 29.4* 30.0* 28.6*  PLT 366 306 260  --  299    COAGS: Recent Labs    11/24/18 1122 07/04/19 1223 09/18/19 0905  INR 0.96 1.1 1.2  APTT  --  26  --     BMP: Recent Labs    09/26/19 0420 09/27/19 0934 09/28/19 0517 09/30/19 0533  NA 139 139 137 139  K 4.1 4.0 3.9 3.7  CL 108 107 107 110  CO2 21* 25 21* 21*  GLUCOSE 103* 116* 94 95  BUN 21 26* 22 13  CALCIUM 9.0 8.8* 8.7* 8.7*  CREATININE 1.16 1.30* 1.31* 1.11  GFRNONAA 58* 50* 49* >60  GFRAA >60 58* 57* >60    LIVER FUNCTION TESTS: Recent Labs  09/23/19 0500 09/26/19 0420 09/27/19 0934 09/28/19 0517  BILITOT 0.8 0.8 0.7 0.6  AST 39 46* 40 24  ALT 67* 111* 99* 74*  ALKPHOS 64 59 81 61  PROT 5.7* 5.9* 5.3* 5.2*  ALBUMIN 2.6* 2.9* 2.5* 2.4*    Assessment and Plan: Patient with history of acute cholecystitis and prior cholecystostomy on 09/18/2019;status post inadvertent displacement of the tube on 12/19 with subsequent drain replacement. Afebrile; WBC 8.9, hgb 8.6(9.0), creat nl; bile cx pend;  continue drain irrigation tid as IP; patient will need follow-up with general surgery to determine if he would be candidate for elective cholecystectomy in the future. He will be scheduled for routine drain exchange in 6-8 weeks in IR ([facility])otherwise.  Upon discharge drain will need to be flushed once daily with 5 ml sterile NS, output recorded and dressing changed every 1-2 days; maintain binder over area to minimize chances of dislodgment; call [phone] or  [phone] with any drain    Electronically Signed: D. Rowe Robert, PA-C 09/30/2019, 11:29 AM   I spent a total of 15 minutes at the the patient's bedside AND on the patient's hospital floor or unit, greater than 50% of which was counseling/coordinating care for gallbladder drain    Patient ID: Derrick [name], male   DOB: 1932-03-22, 85 y.o.   MRN: [id number]

## 2019-10-01 DIAGNOSIS — I480 Paroxysmal atrial fibrillation: Secondary | ICD-10-CM | POA: Diagnosis not present

## 2019-10-01 DIAGNOSIS — I10 Essential (primary) hypertension: Secondary | ICD-10-CM | POA: Diagnosis not present

## 2019-10-01 DIAGNOSIS — K810 Acute cholecystitis: Secondary | ICD-10-CM | POA: Diagnosis not present

## 2019-10-01 DIAGNOSIS — M069 Rheumatoid arthritis, unspecified: Secondary | ICD-10-CM | POA: Diagnosis not present

## 2019-10-04 LAB — AEROBIC/ANAEROBIC CULTURE W GRAM STAIN (SURGICAL/DEEP WOUND): Gram Stain: NONE SEEN

## 2019-10-05 DIAGNOSIS — W19XXXA Unspecified fall, initial encounter: Secondary | ICD-10-CM | POA: Diagnosis not present

## 2019-10-05 DIAGNOSIS — Z978 Presence of other specified devices: Secondary | ICD-10-CM | POA: Diagnosis not present

## 2019-10-05 DIAGNOSIS — K810 Acute cholecystitis: Secondary | ICD-10-CM | POA: Diagnosis not present

## 2019-10-05 DIAGNOSIS — M6281 Muscle weakness (generalized): Secondary | ICD-10-CM | POA: Diagnosis not present

## 2019-10-07 DIAGNOSIS — M6281 Muscle weakness (generalized): Secondary | ICD-10-CM | POA: Diagnosis not present

## 2019-10-07 DIAGNOSIS — G9341 Metabolic encephalopathy: Secondary | ICD-10-CM | POA: Diagnosis not present

## 2019-10-13 DIAGNOSIS — K5900 Constipation, unspecified: Secondary | ICD-10-CM | POA: Diagnosis not present

## 2019-10-13 DIAGNOSIS — R1084 Generalized abdominal pain: Secondary | ICD-10-CM | POA: Diagnosis not present

## 2019-10-13 DIAGNOSIS — R5381 Other malaise: Secondary | ICD-10-CM | POA: Diagnosis not present

## 2019-10-14 DIAGNOSIS — R5381 Other malaise: Secondary | ICD-10-CM | POA: Diagnosis not present

## 2019-10-14 DIAGNOSIS — K5900 Constipation, unspecified: Secondary | ICD-10-CM | POA: Diagnosis not present

## 2019-10-14 DIAGNOSIS — R1084 Generalized abdominal pain: Secondary | ICD-10-CM | POA: Diagnosis not present

## 2019-10-15 DIAGNOSIS — I739 Peripheral vascular disease, unspecified: Secondary | ICD-10-CM | POA: Diagnosis not present

## 2019-10-15 DIAGNOSIS — N183 Chronic kidney disease, stage 3 unspecified: Secondary | ICD-10-CM | POA: Diagnosis not present

## 2019-10-15 DIAGNOSIS — M6281 Muscle weakness (generalized): Secondary | ICD-10-CM | POA: Diagnosis not present

## 2019-10-15 DIAGNOSIS — I4891 Unspecified atrial fibrillation: Secondary | ICD-10-CM | POA: Diagnosis not present

## 2019-10-20 DIAGNOSIS — Z8673 Personal history of transient ischemic attack (TIA), and cerebral infarction without residual deficits: Secondary | ICD-10-CM | POA: Diagnosis not present

## 2019-10-20 DIAGNOSIS — Z48815 Encounter for surgical aftercare following surgery on the digestive system: Secondary | ICD-10-CM | POA: Diagnosis not present

## 2019-10-20 DIAGNOSIS — Z7982 Long term (current) use of aspirin: Secondary | ICD-10-CM | POA: Diagnosis not present

## 2019-10-20 DIAGNOSIS — M48062 Spinal stenosis, lumbar region with neurogenic claudication: Secondary | ICD-10-CM | POA: Diagnosis not present

## 2019-10-20 DIAGNOSIS — I739 Peripheral vascular disease, unspecified: Secondary | ICD-10-CM | POA: Diagnosis not present

## 2019-10-20 DIAGNOSIS — Z7902 Long term (current) use of antithrombotics/antiplatelets: Secondary | ICD-10-CM | POA: Diagnosis not present

## 2019-10-20 DIAGNOSIS — I480 Paroxysmal atrial fibrillation: Secondary | ICD-10-CM | POA: Diagnosis not present

## 2019-10-20 DIAGNOSIS — N183 Chronic kidney disease, stage 3 unspecified: Secondary | ICD-10-CM | POA: Diagnosis not present

## 2019-10-20 DIAGNOSIS — K810 Acute cholecystitis: Secondary | ICD-10-CM | POA: Diagnosis not present

## 2019-10-20 DIAGNOSIS — M069 Rheumatoid arthritis, unspecified: Secondary | ICD-10-CM | POA: Diagnosis not present

## 2019-10-20 DIAGNOSIS — D62 Acute posthemorrhagic anemia: Secondary | ICD-10-CM | POA: Diagnosis not present

## 2019-10-20 DIAGNOSIS — H9193 Unspecified hearing loss, bilateral: Secondary | ICD-10-CM | POA: Diagnosis not present

## 2019-10-20 DIAGNOSIS — Z4803 Encounter for change or removal of drains: Secondary | ICD-10-CM | POA: Diagnosis not present

## 2019-10-20 DIAGNOSIS — D631 Anemia in chronic kidney disease: Secondary | ICD-10-CM | POA: Diagnosis not present

## 2019-10-20 DIAGNOSIS — Z9181 History of falling: Secondary | ICD-10-CM | POA: Diagnosis not present

## 2019-10-20 DIAGNOSIS — J449 Chronic obstructive pulmonary disease, unspecified: Secondary | ICD-10-CM | POA: Diagnosis not present

## 2019-10-20 DIAGNOSIS — I129 Hypertensive chronic kidney disease with stage 1 through stage 4 chronic kidney disease, or unspecified chronic kidney disease: Secondary | ICD-10-CM | POA: Diagnosis not present

## 2019-10-20 DIAGNOSIS — K219 Gastro-esophageal reflux disease without esophagitis: Secondary | ICD-10-CM | POA: Diagnosis not present

## 2019-10-20 DIAGNOSIS — I2510 Atherosclerotic heart disease of native coronary artery without angina pectoris: Secondary | ICD-10-CM | POA: Diagnosis not present

## 2019-10-21 DIAGNOSIS — Z4803 Encounter for change or removal of drains: Secondary | ICD-10-CM | POA: Diagnosis not present

## 2019-10-21 DIAGNOSIS — M069 Rheumatoid arthritis, unspecified: Secondary | ICD-10-CM | POA: Diagnosis not present

## 2019-10-21 DIAGNOSIS — D631 Anemia in chronic kidney disease: Secondary | ICD-10-CM | POA: Diagnosis not present

## 2019-10-21 DIAGNOSIS — I739 Peripheral vascular disease, unspecified: Secondary | ICD-10-CM | POA: Diagnosis not present

## 2019-10-21 DIAGNOSIS — J449 Chronic obstructive pulmonary disease, unspecified: Secondary | ICD-10-CM | POA: Diagnosis not present

## 2019-10-21 DIAGNOSIS — M48062 Spinal stenosis, lumbar region with neurogenic claudication: Secondary | ICD-10-CM | POA: Diagnosis not present

## 2019-10-21 DIAGNOSIS — K219 Gastro-esophageal reflux disease without esophagitis: Secondary | ICD-10-CM | POA: Diagnosis not present

## 2019-10-21 DIAGNOSIS — Z7982 Long term (current) use of aspirin: Secondary | ICD-10-CM | POA: Diagnosis not present

## 2019-10-21 DIAGNOSIS — I480 Paroxysmal atrial fibrillation: Secondary | ICD-10-CM | POA: Diagnosis not present

## 2019-10-21 DIAGNOSIS — Z9181 History of falling: Secondary | ICD-10-CM | POA: Diagnosis not present

## 2019-10-21 DIAGNOSIS — K810 Acute cholecystitis: Secondary | ICD-10-CM | POA: Diagnosis not present

## 2019-10-21 DIAGNOSIS — I2510 Atherosclerotic heart disease of native coronary artery without angina pectoris: Secondary | ICD-10-CM | POA: Diagnosis not present

## 2019-10-21 DIAGNOSIS — Z48815 Encounter for surgical aftercare following surgery on the digestive system: Secondary | ICD-10-CM | POA: Diagnosis not present

## 2019-10-21 DIAGNOSIS — D62 Acute posthemorrhagic anemia: Secondary | ICD-10-CM | POA: Diagnosis not present

## 2019-10-21 DIAGNOSIS — Z8673 Personal history of transient ischemic attack (TIA), and cerebral infarction without residual deficits: Secondary | ICD-10-CM | POA: Diagnosis not present

## 2019-10-21 DIAGNOSIS — H9193 Unspecified hearing loss, bilateral: Secondary | ICD-10-CM | POA: Diagnosis not present

## 2019-10-21 DIAGNOSIS — Z7902 Long term (current) use of antithrombotics/antiplatelets: Secondary | ICD-10-CM | POA: Diagnosis not present

## 2019-10-21 DIAGNOSIS — N183 Chronic kidney disease, stage 3 unspecified: Secondary | ICD-10-CM | POA: Diagnosis not present

## 2019-10-21 DIAGNOSIS — I129 Hypertensive chronic kidney disease with stage 1 through stage 4 chronic kidney disease, or unspecified chronic kidney disease: Secondary | ICD-10-CM | POA: Diagnosis not present

## 2019-10-22 DIAGNOSIS — Z7982 Long term (current) use of aspirin: Secondary | ICD-10-CM | POA: Diagnosis not present

## 2019-10-22 DIAGNOSIS — I480 Paroxysmal atrial fibrillation: Secondary | ICD-10-CM | POA: Diagnosis not present

## 2019-10-22 DIAGNOSIS — K810 Acute cholecystitis: Secondary | ICD-10-CM | POA: Diagnosis not present

## 2019-10-22 DIAGNOSIS — Z8673 Personal history of transient ischemic attack (TIA), and cerebral infarction without residual deficits: Secondary | ICD-10-CM | POA: Diagnosis not present

## 2019-10-22 DIAGNOSIS — I129 Hypertensive chronic kidney disease with stage 1 through stage 4 chronic kidney disease, or unspecified chronic kidney disease: Secondary | ICD-10-CM | POA: Diagnosis not present

## 2019-10-22 DIAGNOSIS — J449 Chronic obstructive pulmonary disease, unspecified: Secondary | ICD-10-CM | POA: Diagnosis not present

## 2019-10-22 DIAGNOSIS — Z4803 Encounter for change or removal of drains: Secondary | ICD-10-CM | POA: Diagnosis not present

## 2019-10-22 DIAGNOSIS — N183 Chronic kidney disease, stage 3 unspecified: Secondary | ICD-10-CM | POA: Diagnosis not present

## 2019-10-22 DIAGNOSIS — K219 Gastro-esophageal reflux disease without esophagitis: Secondary | ICD-10-CM | POA: Diagnosis not present

## 2019-10-22 DIAGNOSIS — I739 Peripheral vascular disease, unspecified: Secondary | ICD-10-CM | POA: Diagnosis not present

## 2019-10-22 DIAGNOSIS — I2510 Atherosclerotic heart disease of native coronary artery without angina pectoris: Secondary | ICD-10-CM | POA: Diagnosis not present

## 2019-10-22 DIAGNOSIS — Z9181 History of falling: Secondary | ICD-10-CM | POA: Diagnosis not present

## 2019-10-22 DIAGNOSIS — Z48815 Encounter for surgical aftercare following surgery on the digestive system: Secondary | ICD-10-CM | POA: Diagnosis not present

## 2019-10-22 DIAGNOSIS — D631 Anemia in chronic kidney disease: Secondary | ICD-10-CM | POA: Diagnosis not present

## 2019-10-22 DIAGNOSIS — Z7902 Long term (current) use of antithrombotics/antiplatelets: Secondary | ICD-10-CM | POA: Diagnosis not present

## 2019-10-22 DIAGNOSIS — H9193 Unspecified hearing loss, bilateral: Secondary | ICD-10-CM | POA: Diagnosis not present

## 2019-10-22 DIAGNOSIS — D62 Acute posthemorrhagic anemia: Secondary | ICD-10-CM | POA: Diagnosis not present

## 2019-10-22 DIAGNOSIS — M48062 Spinal stenosis, lumbar region with neurogenic claudication: Secondary | ICD-10-CM | POA: Diagnosis not present

## 2019-10-22 DIAGNOSIS — M069 Rheumatoid arthritis, unspecified: Secondary | ICD-10-CM | POA: Diagnosis not present

## 2019-10-27 DIAGNOSIS — K810 Acute cholecystitis: Secondary | ICD-10-CM | POA: Diagnosis not present

## 2019-10-27 DIAGNOSIS — Z7902 Long term (current) use of antithrombotics/antiplatelets: Secondary | ICD-10-CM | POA: Diagnosis not present

## 2019-10-27 DIAGNOSIS — J449 Chronic obstructive pulmonary disease, unspecified: Secondary | ICD-10-CM | POA: Diagnosis not present

## 2019-10-27 DIAGNOSIS — D649 Anemia, unspecified: Secondary | ICD-10-CM | POA: Diagnosis not present

## 2019-10-27 DIAGNOSIS — K219 Gastro-esophageal reflux disease without esophagitis: Secondary | ICD-10-CM | POA: Diagnosis not present

## 2019-10-27 DIAGNOSIS — N183 Chronic kidney disease, stage 3 unspecified: Secondary | ICD-10-CM | POA: Diagnosis not present

## 2019-10-27 DIAGNOSIS — Z8673 Personal history of transient ischemic attack (TIA), and cerebral infarction without residual deficits: Secondary | ICD-10-CM | POA: Diagnosis not present

## 2019-10-27 DIAGNOSIS — H9193 Unspecified hearing loss, bilateral: Secondary | ICD-10-CM | POA: Diagnosis not present

## 2019-10-27 DIAGNOSIS — D62 Acute posthemorrhagic anemia: Secondary | ICD-10-CM | POA: Diagnosis not present

## 2019-10-27 DIAGNOSIS — Z8679 Personal history of other diseases of the circulatory system: Secondary | ICD-10-CM | POA: Diagnosis not present

## 2019-10-27 DIAGNOSIS — I739 Peripheral vascular disease, unspecified: Secondary | ICD-10-CM | POA: Diagnosis not present

## 2019-10-27 DIAGNOSIS — M069 Rheumatoid arthritis, unspecified: Secondary | ICD-10-CM | POA: Diagnosis not present

## 2019-10-27 DIAGNOSIS — I2510 Atherosclerotic heart disease of native coronary artery without angina pectoris: Secondary | ICD-10-CM | POA: Diagnosis not present

## 2019-10-27 DIAGNOSIS — I129 Hypertensive chronic kidney disease with stage 1 through stage 4 chronic kidney disease, or unspecified chronic kidney disease: Secondary | ICD-10-CM | POA: Diagnosis not present

## 2019-10-27 DIAGNOSIS — D631 Anemia in chronic kidney disease: Secondary | ICD-10-CM | POA: Diagnosis not present

## 2019-10-27 DIAGNOSIS — M48062 Spinal stenosis, lumbar region with neurogenic claudication: Secondary | ICD-10-CM | POA: Diagnosis not present

## 2019-10-27 DIAGNOSIS — I480 Paroxysmal atrial fibrillation: Secondary | ICD-10-CM | POA: Diagnosis not present

## 2019-10-27 DIAGNOSIS — Z4803 Encounter for change or removal of drains: Secondary | ICD-10-CM | POA: Diagnosis not present

## 2019-10-27 DIAGNOSIS — Z9181 History of falling: Secondary | ICD-10-CM | POA: Diagnosis not present

## 2019-10-27 DIAGNOSIS — Z09 Encounter for follow-up examination after completed treatment for conditions other than malignant neoplasm: Secondary | ICD-10-CM | POA: Diagnosis not present

## 2019-10-27 DIAGNOSIS — Z79899 Other long term (current) drug therapy: Secondary | ICD-10-CM | POA: Diagnosis not present

## 2019-10-27 DIAGNOSIS — Z48815 Encounter for surgical aftercare following surgery on the digestive system: Secondary | ICD-10-CM | POA: Diagnosis not present

## 2019-10-27 DIAGNOSIS — Z7982 Long term (current) use of aspirin: Secondary | ICD-10-CM | POA: Diagnosis not present

## 2019-10-29 DIAGNOSIS — Z8673 Personal history of transient ischemic attack (TIA), and cerebral infarction without residual deficits: Secondary | ICD-10-CM | POA: Diagnosis not present

## 2019-10-29 DIAGNOSIS — I129 Hypertensive chronic kidney disease with stage 1 through stage 4 chronic kidney disease, or unspecified chronic kidney disease: Secondary | ICD-10-CM | POA: Diagnosis not present

## 2019-10-29 DIAGNOSIS — I739 Peripheral vascular disease, unspecified: Secondary | ICD-10-CM | POA: Diagnosis not present

## 2019-10-29 DIAGNOSIS — N183 Chronic kidney disease, stage 3 unspecified: Secondary | ICD-10-CM | POA: Diagnosis not present

## 2019-10-29 DIAGNOSIS — Z9181 History of falling: Secondary | ICD-10-CM | POA: Diagnosis not present

## 2019-10-29 DIAGNOSIS — Z7982 Long term (current) use of aspirin: Secondary | ICD-10-CM | POA: Diagnosis not present

## 2019-10-29 DIAGNOSIS — I480 Paroxysmal atrial fibrillation: Secondary | ICD-10-CM | POA: Diagnosis not present

## 2019-10-29 DIAGNOSIS — Z4803 Encounter for change or removal of drains: Secondary | ICD-10-CM | POA: Diagnosis not present

## 2019-10-29 DIAGNOSIS — I2510 Atherosclerotic heart disease of native coronary artery without angina pectoris: Secondary | ICD-10-CM | POA: Diagnosis not present

## 2019-10-29 DIAGNOSIS — M48062 Spinal stenosis, lumbar region with neurogenic claudication: Secondary | ICD-10-CM | POA: Diagnosis not present

## 2019-10-29 DIAGNOSIS — D631 Anemia in chronic kidney disease: Secondary | ICD-10-CM | POA: Diagnosis not present

## 2019-10-29 DIAGNOSIS — K219 Gastro-esophageal reflux disease without esophagitis: Secondary | ICD-10-CM | POA: Diagnosis not present

## 2019-10-29 DIAGNOSIS — K810 Acute cholecystitis: Secondary | ICD-10-CM | POA: Diagnosis not present

## 2019-10-29 DIAGNOSIS — Z48815 Encounter for surgical aftercare following surgery on the digestive system: Secondary | ICD-10-CM | POA: Diagnosis not present

## 2019-10-29 DIAGNOSIS — H9193 Unspecified hearing loss, bilateral: Secondary | ICD-10-CM | POA: Diagnosis not present

## 2019-10-29 DIAGNOSIS — Z7902 Long term (current) use of antithrombotics/antiplatelets: Secondary | ICD-10-CM | POA: Diagnosis not present

## 2019-10-29 DIAGNOSIS — D62 Acute posthemorrhagic anemia: Secondary | ICD-10-CM | POA: Diagnosis not present

## 2019-10-29 DIAGNOSIS — M069 Rheumatoid arthritis, unspecified: Secondary | ICD-10-CM | POA: Diagnosis not present

## 2019-10-29 DIAGNOSIS — J449 Chronic obstructive pulmonary disease, unspecified: Secondary | ICD-10-CM | POA: Diagnosis not present

## 2019-11-03 DIAGNOSIS — Z48815 Encounter for surgical aftercare following surgery on the digestive system: Secondary | ICD-10-CM | POA: Diagnosis not present

## 2019-11-03 DIAGNOSIS — H9193 Unspecified hearing loss, bilateral: Secondary | ICD-10-CM | POA: Diagnosis not present

## 2019-11-03 DIAGNOSIS — I739 Peripheral vascular disease, unspecified: Secondary | ICD-10-CM | POA: Diagnosis not present

## 2019-11-03 DIAGNOSIS — Z4803 Encounter for change or removal of drains: Secondary | ICD-10-CM | POA: Diagnosis not present

## 2019-11-03 DIAGNOSIS — Z7982 Long term (current) use of aspirin: Secondary | ICD-10-CM | POA: Diagnosis not present

## 2019-11-03 DIAGNOSIS — M069 Rheumatoid arthritis, unspecified: Secondary | ICD-10-CM | POA: Diagnosis not present

## 2019-11-03 DIAGNOSIS — D62 Acute posthemorrhagic anemia: Secondary | ICD-10-CM | POA: Diagnosis not present

## 2019-11-03 DIAGNOSIS — J449 Chronic obstructive pulmonary disease, unspecified: Secondary | ICD-10-CM | POA: Diagnosis not present

## 2019-11-03 DIAGNOSIS — K810 Acute cholecystitis: Secondary | ICD-10-CM | POA: Diagnosis not present

## 2019-11-03 DIAGNOSIS — D631 Anemia in chronic kidney disease: Secondary | ICD-10-CM | POA: Diagnosis not present

## 2019-11-03 DIAGNOSIS — M48062 Spinal stenosis, lumbar region with neurogenic claudication: Secondary | ICD-10-CM | POA: Diagnosis not present

## 2019-11-03 DIAGNOSIS — I129 Hypertensive chronic kidney disease with stage 1 through stage 4 chronic kidney disease, or unspecified chronic kidney disease: Secondary | ICD-10-CM | POA: Diagnosis not present

## 2019-11-03 DIAGNOSIS — Z8673 Personal history of transient ischemic attack (TIA), and cerebral infarction without residual deficits: Secondary | ICD-10-CM | POA: Diagnosis not present

## 2019-11-03 DIAGNOSIS — I2510 Atherosclerotic heart disease of native coronary artery without angina pectoris: Secondary | ICD-10-CM | POA: Diagnosis not present

## 2019-11-03 DIAGNOSIS — Z9181 History of falling: Secondary | ICD-10-CM | POA: Diagnosis not present

## 2019-11-03 DIAGNOSIS — I480 Paroxysmal atrial fibrillation: Secondary | ICD-10-CM | POA: Diagnosis not present

## 2019-11-03 DIAGNOSIS — Z7902 Long term (current) use of antithrombotics/antiplatelets: Secondary | ICD-10-CM | POA: Diagnosis not present

## 2019-11-03 DIAGNOSIS — K219 Gastro-esophageal reflux disease without esophagitis: Secondary | ICD-10-CM | POA: Diagnosis not present

## 2019-11-03 DIAGNOSIS — N183 Chronic kidney disease, stage 3 unspecified: Secondary | ICD-10-CM | POA: Diagnosis not present

## 2019-11-04 DIAGNOSIS — K810 Acute cholecystitis: Secondary | ICD-10-CM | POA: Diagnosis not present

## 2019-11-04 DIAGNOSIS — D62 Acute posthemorrhagic anemia: Secondary | ICD-10-CM | POA: Diagnosis not present

## 2019-11-04 DIAGNOSIS — M069 Rheumatoid arthritis, unspecified: Secondary | ICD-10-CM | POA: Diagnosis not present

## 2019-11-04 DIAGNOSIS — Z9181 History of falling: Secondary | ICD-10-CM | POA: Diagnosis not present

## 2019-11-04 DIAGNOSIS — I129 Hypertensive chronic kidney disease with stage 1 through stage 4 chronic kidney disease, or unspecified chronic kidney disease: Secondary | ICD-10-CM | POA: Diagnosis not present

## 2019-11-04 DIAGNOSIS — I480 Paroxysmal atrial fibrillation: Secondary | ICD-10-CM | POA: Diagnosis not present

## 2019-11-04 DIAGNOSIS — N183 Chronic kidney disease, stage 3 unspecified: Secondary | ICD-10-CM | POA: Diagnosis not present

## 2019-11-04 DIAGNOSIS — Z48815 Encounter for surgical aftercare following surgery on the digestive system: Secondary | ICD-10-CM | POA: Diagnosis not present

## 2019-11-04 DIAGNOSIS — K219 Gastro-esophageal reflux disease without esophagitis: Secondary | ICD-10-CM | POA: Diagnosis not present

## 2019-11-04 DIAGNOSIS — M48062 Spinal stenosis, lumbar region with neurogenic claudication: Secondary | ICD-10-CM | POA: Diagnosis not present

## 2019-11-04 DIAGNOSIS — Z8673 Personal history of transient ischemic attack (TIA), and cerebral infarction without residual deficits: Secondary | ICD-10-CM | POA: Diagnosis not present

## 2019-11-04 DIAGNOSIS — H9193 Unspecified hearing loss, bilateral: Secondary | ICD-10-CM | POA: Diagnosis not present

## 2019-11-04 DIAGNOSIS — D631 Anemia in chronic kidney disease: Secondary | ICD-10-CM | POA: Diagnosis not present

## 2019-11-04 DIAGNOSIS — I739 Peripheral vascular disease, unspecified: Secondary | ICD-10-CM | POA: Diagnosis not present

## 2019-11-04 DIAGNOSIS — Z4803 Encounter for change or removal of drains: Secondary | ICD-10-CM | POA: Diagnosis not present

## 2019-11-04 DIAGNOSIS — I2510 Atherosclerotic heart disease of native coronary artery without angina pectoris: Secondary | ICD-10-CM | POA: Diagnosis not present

## 2019-11-04 DIAGNOSIS — Z7902 Long term (current) use of antithrombotics/antiplatelets: Secondary | ICD-10-CM | POA: Diagnosis not present

## 2019-11-04 DIAGNOSIS — J449 Chronic obstructive pulmonary disease, unspecified: Secondary | ICD-10-CM | POA: Diagnosis not present

## 2019-11-04 DIAGNOSIS — Z7982 Long term (current) use of aspirin: Secondary | ICD-10-CM | POA: Diagnosis not present

## 2019-11-05 ENCOUNTER — Telehealth: Payer: Self-pay | Admitting: *Deleted

## 2019-11-05 DIAGNOSIS — K8010 Calculus of gallbladder with chronic cholecystitis without obstruction: Secondary | ICD-10-CM | POA: Diagnosis not present

## 2019-11-05 NOTE — Telephone Encounter (Signed)
   [facility] Pre-operative Risk Assessment    Request for surgical clearance:  1. What type of surgery is being performed? GALLBLADDER  2. When is this surgery scheduled?  TBD   3. What type of clearance is required (medical clearance vs. Pharmacy clearance to hold med vs. Both)?  BOTH  4. Are there any medications that need to be held prior to surgery and how long? PLAVIX  HOLD 5 DAYS   5. Practice name and name of physician performing surgery? [facility]  6. What is your office phone number [phone]    7.   What is your office fax number [phone]  8.   Anesthesia type (None, local, MAC, general) ?  GENERAL    Devra Dopp 11/05/2019, 3:30 PM  _________________________________________________________________   (provider comments below)

## 2019-11-05 NOTE — Telephone Encounter (Signed)
   Primary Cardiologist:Mihai Croitoru, MD  Chart reviewed as part of pre-operative protocol coverage. Because of Derrick [name]'s past medical history and time since last visit, he/she will require a follow-up visit in order to better assess preoperative cardiovascular risk.  Pre-op covering staff: - Please schedule appointment and call patient to inform them. - Please contact requesting surgeon's office via preferred method (i.e, phone, fax) to inform them of need for appointment prior to surgery.   [place], [place]  11/05/2019, 3:36 PM

## 2019-11-05 NOTE — Telephone Encounter (Signed)
Left message for the pt to call office and schedule pre op clearance appt with Dr. [name] or his care team.

## 2019-11-06 DIAGNOSIS — M48062 Spinal stenosis, lumbar region with neurogenic claudication: Secondary | ICD-10-CM | POA: Diagnosis not present

## 2019-11-06 DIAGNOSIS — Z9181 History of falling: Secondary | ICD-10-CM | POA: Diagnosis not present

## 2019-11-06 DIAGNOSIS — I480 Paroxysmal atrial fibrillation: Secondary | ICD-10-CM | POA: Diagnosis not present

## 2019-11-06 DIAGNOSIS — I2510 Atherosclerotic heart disease of native coronary artery without angina pectoris: Secondary | ICD-10-CM | POA: Diagnosis not present

## 2019-11-06 DIAGNOSIS — N183 Chronic kidney disease, stage 3 unspecified: Secondary | ICD-10-CM | POA: Diagnosis not present

## 2019-11-06 DIAGNOSIS — Z7902 Long term (current) use of antithrombotics/antiplatelets: Secondary | ICD-10-CM | POA: Diagnosis not present

## 2019-11-06 DIAGNOSIS — H9193 Unspecified hearing loss, bilateral: Secondary | ICD-10-CM | POA: Diagnosis not present

## 2019-11-06 DIAGNOSIS — Z48815 Encounter for surgical aftercare following surgery on the digestive system: Secondary | ICD-10-CM | POA: Diagnosis not present

## 2019-11-06 DIAGNOSIS — I129 Hypertensive chronic kidney disease with stage 1 through stage 4 chronic kidney disease, or unspecified chronic kidney disease: Secondary | ICD-10-CM | POA: Diagnosis not present

## 2019-11-06 DIAGNOSIS — Z7982 Long term (current) use of aspirin: Secondary | ICD-10-CM | POA: Diagnosis not present

## 2019-11-06 DIAGNOSIS — D631 Anemia in chronic kidney disease: Secondary | ICD-10-CM | POA: Diagnosis not present

## 2019-11-06 DIAGNOSIS — J449 Chronic obstructive pulmonary disease, unspecified: Secondary | ICD-10-CM | POA: Diagnosis not present

## 2019-11-06 DIAGNOSIS — K219 Gastro-esophageal reflux disease without esophagitis: Secondary | ICD-10-CM | POA: Diagnosis not present

## 2019-11-06 DIAGNOSIS — K810 Acute cholecystitis: Secondary | ICD-10-CM | POA: Diagnosis not present

## 2019-11-06 DIAGNOSIS — Z4803 Encounter for change or removal of drains: Secondary | ICD-10-CM | POA: Diagnosis not present

## 2019-11-06 DIAGNOSIS — I739 Peripheral vascular disease, unspecified: Secondary | ICD-10-CM | POA: Diagnosis not present

## 2019-11-06 DIAGNOSIS — M069 Rheumatoid arthritis, unspecified: Secondary | ICD-10-CM | POA: Diagnosis not present

## 2019-11-06 DIAGNOSIS — D62 Acute posthemorrhagic anemia: Secondary | ICD-10-CM | POA: Diagnosis not present

## 2019-11-06 DIAGNOSIS — Z8673 Personal history of transient ischemic attack (TIA), and cerebral infarction without residual deficits: Secondary | ICD-10-CM | POA: Diagnosis not present

## 2019-11-06 NOTE — Telephone Encounter (Signed)
Pt has appt for pre op clearance with Roby Lofts, PA 11/09/19. I will forward clearance note to PA for upcoming appt. I will remove from the pre op call back pool.

## 2019-11-07 NOTE — Progress Notes (Signed)
Cardiology Office Note   Date:  11/09/2019   ID:  Derrick [name], DOB [birth year], MRN [id number]  PCP:  [name], MD  Cardiologist:  Sanda Klein, MD EP: None  Chief Complaint  Patient presents with   Pre-op Exam      History of Present Illness: Derrick [name] is a 86 y.o. male with a PMH of non-obstructive CAD (moderate LAD and RCA disease on LHC in 2013), labile HTN, HLD, RA on prednisone, RCC s/p nephrectomy with CKD stage 3, depression, dementia, and recently diagnosed cholecystitis who presents for preoperative assessment.  He was last evaluated by cardiology via a telemedicine visit with Kerin Ransom, PA-C 02/06/2019 for the evaluation of suspected new atrial fibrillation. EKG's reviewed by Dr. [name] and felt to be sinus rhythm with PACs/blocked PACs. No medication changes occurred and he was recommended to follow-up in office in 3-4 months.    He was admitted to the hospital 09/16/2019-09/30/2019 for sepsis 2/2 acute cholecystitis and E.coli bacteremia. He had a cholecystostomy tube placed by IR and was treated with IV antibiotics. His hospital course was reportedly complicated by atrial fibrillation post cholecystotomy tube placement, however on review of EKG's and rhythm strips from that admission I am not convinced of this - more likely MAT vs wandering atrial pacemaker. Echocardiogram 09/2019 showed EF 55-60% mild LVH, indeterminate LV diastolic function, no RWMA, moderate LAE, and mild MR/PR.   His last ischemic evaluation was a NST in 2017 which showed normal EF and no ischemia. He had a LHC in 2013 which showed moderate mRCA and pLAD stenosis.   He presents today with his son for preoperative assessment. He has been doing fairly well since discharge from the hospital. He is working with physical therapy to improve his muscle strength and gait stability 2-3x per week. He is still quite limited in activity due to joint pains/weakness and is unable to complete 4 METs without  assistance. He denies any chest pain or SOB. No complaints of palpitations, orthopnea, PND, LE edema, dizziness, lightheadedness, syncope, or recent falls.  He is anticipating starting iron infusions for management of his anemia.    Past Medical History:  Diagnosis Date   Anemia    Anxiety    Arthritis, rheumatoid (HCC)    BPH (benign prostatic hyperplasia)    CAD (coronary artery disease)    CKD (chronic kidney disease)    Depression    Diverticulosis    Elevated PSA    Gangrene (HCC)    of toe right foot   GERD (gastroesophageal reflux disease)    Herpes simplex labialis    High cholesterol    History of colonic polyps    Hypertension    Insomnia    Lacunar infarction ([place])    Memory loss    Obesity    Panic attacks    Prostate cancer ([place]) YRS AGO   Renal cell cancer ([place])    s/p nephrectomy in 2000   Seropositive rheumatoid arthritis ([place])    Spermatocele    Tinnitus    Tremor of both hands     Past Surgical History:  Procedure Laterality Date   AMPUTATION TOE Right 06/13/2017   Procedure: AMPUTATION TOE 1st and 2nd;  Surgeon: [name], DPM;  Location: [facility];  Service: Podiatry;  Laterality: Right;   AMPUTATION TOE     RIGHT FOOT ALL TOES GONE   BACK SURGERY  YRS AGO   LOWER   COLONOSCOPY  GRAFT APPLICATION Right 0000000   Procedure: EPIFIX GRAFT APPLICATION OF RIGHT FOOT;  Surgeon: [name], DPM;  Location: [place];  Service: Podiatry;  Laterality: Right;   I & D EXTREMITY Right 09/13/2017   Procedure: IRRIGATION AND DEBRIDEMENT AND BONE BIOPSY;  Surgeon: [name], DPM;  Location: [place];  Service: Podiatry;  Laterality: Right;   INCISION AND DRAINAGE OF WOUND Right 12/18/2017   Procedure: IRRIGATION AND DEBRIDEMENT WOUND ON RIGHT FOOT;  Surgeon: [name], DPM;  Location: [place];  Service: Podiatry;  Laterality: Right;   IR ANGIOGRAM EXTREMITY RIGHT  05/30/2017   IR ANGIOGRAM EXTREMITY RIGHT   08/08/2017   IR ANGIOGRAM SELECTIVE EACH ADDITIONAL VESSEL  08/08/2017   IR ANGIOGRAM SELECTIVE EACH ADDITIONAL VESSEL  08/08/2017   IR CATHETER TUBE CHANGE  09/26/2019   IR PERC CHOLECYSTOSTOMY  09/18/2019   IR RADIOLOGIST EVAL & MGMT  05/16/2017   IR TIB-PERO ART PTA MOD SED  05/30/2017   IR TIB-PERO ART PTA MOD SED  08/08/2017   IR TIB-PERO ART UNI PTA EA ADD VESSEL MOD SED  05/30/2017   IR TIB-PERO ART UNI PTA EA ADD VESSEL MOD SED  08/08/2017   IR US GUIDE VASC ACCESS RIGHT  05/30/2017   IR US GUIDE VASC ACCESS RIGHT  08/08/2017   LEFT HEART CATHETERIZATION WITH CORONARY ANGIOGRAM N/A 11/19/2011   Procedure: LEFT HEART CATHETERIZATION WITH CORONARY ANGIOGRAM;  Surgeon: [name], MD;  Location: [facility] C[facility];  Service: Cardiovascular;  Laterality: N/A;   LUMBAR LAMINECTOMY/DECOMPRESSION MICRODISCECTOMY N/A 11/26/2018   Procedure: Lumbar central decompression L2-3, L3-4;  Surgeon: [name], MD;  Location: [facility];  Service: Orthopedics;  Laterality: N/A;  15min   NEPHRECTOMY  10/1998   TRANSMETATARSAL AMPUTATION Right 08/14/2017   Procedure: TRANSMETATARSAL AMPUTATION;  Surgeon: [name], DPM;  Location: [place];  Service: Podiatry;  Laterality: Right;     Current Outpatient Medications  Medication Sig Dispense Refill   acetaminophen (TYLENOL) 325 MG tablet Take 2 tablets (650 mg total) by mouth every 6 (six) hours as needed for mild pain (or Fever >/= 101).     Amino Acids-Protein Hydrolys (FEEDING SUPPLEMENT, PRO-STAT SUGAR FREE 64,) LIQD Take 30 mLs by mouth 2 (two) times daily. 887 mL 0   aspirin 81 MG chewable tablet Chew 81 mg by mouth daily.     atorvastatin (LIPITOR) 40 MG tablet Take 40 mg by mouth daily.     clopidogrel (PLAVIX) 75 MG tablet Take 75 mg by mouth daily.     diltiazem (CARDIZEM) 30 MG tablet Take 30 mg by mouth 2 (two) times daily.     docusate sodium (COLACE) 250 MG capsule Take 250 mg by mouth daily.     feeding supplement, ENSURE  ENLIVE, (ENSURE ENLIVE) LIQD Take 237 mLs by mouth 2 (two) times daily between meals. 237 mL 12   finasteride (PROSCAR) 5 MG tablet Take 5 mg by mouth every evening.      hydroxychloroquine (PLAQUENIL) 200 MG tablet Take 200 mg by mouth daily.     Multiple Vitamin (MULTIVITAMIN WITH MINERALS) TABS tablet Take 1 tablet by mouth every evening.      nitroGLYCERIN (NITROSTAT) 0.3 MG SL tablet Place 0.3 mg under the tongue every 5 (five) minutes as needed for chest pain.     Omega-3 Fatty Acids (FISH OIL) 1000 MG CAPS Take 1,000 mg by mouth daily.     ondansetron (ZOFRAN) 4 MG tablet Take 1 tablet (4  mg total) by mouth every 6 (six) hours as needed for nausea. 20 tablet 0   pantoprazole (PROTONIX) 40 MG tablet Take 40 mg by mouth 2 (two) times daily.     polyethylene glycol (MIRALAX / GLYCOLAX) 17 g packet Take 17 g by mouth daily as needed for mild constipation.     predniSONE (DELTASONE) 5 MG tablet Take 5 mg by mouth daily with breakfast.     sertraline (ZOLOFT) 25 MG tablet Take 25 mg by mouth daily.     No current facility-administered medications for this visit.    Allergies:   Effexor [venlafaxine], Lisinopril, Paxil [paroxetine], Viagra [sildenafil], and Wellbutrin [bupropion]    Social History:  The patient  reports that he quit smoking about 41 years ago. His smoking use included cigarettes. He has a 29.00 pack-year smoking history. He has never used smokeless tobacco. He reports that he does not drink alcohol or use drugs.   Family History:  The patient's family history includes Breast cancer (age of onset: 72) in his daughter; COPD in his son; Cancer in his paternal aunt and sister; Heart attack (age of onset: 61) in his son; Heart attack (age of onset: 27) in his father; Kidney disease in his sister; Kidney failure in his sister; Leukemia (age of onset: 93) in his son; Stroke in his father.    ROS:  Please see the history of present illness.   Otherwise, review of systems are  positive for none.   All other systems are reviewed and negative.    PHYSICAL EXAM: VS:  BP 118/66    Pulse 90    Temp 97.7 F (36.5 C)    Ht 6' (1.829 m)    Wt 168 lb 9.6 oz (76.5 kg)    SpO2 96%    BMI 22.87 kg/m  , BMI Body mass index is 22.87 kg/m. GEN: Thin elderly gentleman sitting in wheelchair in no acute distress HEENT: sclera anicteric Neck: no JVD, carotid bruits, or masses Cardiac: RRR; +murmur (LLSB), no rubs or gallops; no edema  Respiratory:  clear to auscultation bilaterally, normal work of breathing GI: soft, nontender, nondistended, + BS MS: no deformity or atrophy Skin: warm and dry, bruising noted on bilateral hands Neuro:  Strength and sensation are intact Psych: euthymic mood, full affect   EKG:  EKG is ordered today. The ekg ordered today demonstrates suspected wandering atrial pacemaker with PACs/ blocked PAC, PVC, no STE/D, non-specific T wave abnormalities; no significant change from previous.    Recent Labs: 11/29/2018: TSH 0.566 12/12/2018: B Natriuretic Peptide 69.1 09/23/2019: Magnesium 1.9 09/28/2019: ALT 74 09/30/2019: BUN 13; Creatinine, Ser 1.11; Hemoglobin 8.6; Platelets 299; Potassium 3.7; Sodium 139    Lipid Panel    Component Value Date/Time   CHOL 148 07/04/2019 1223   CHOL 171 10/04/2017 1201   TRIG 61 07/04/2019 1223   HDL 83 07/04/2019 1223   HDL 61 10/04/2017 1201   CHOLHDL 1.8 07/04/2019 1223   VLDL 12 07/04/2019 1223   LDLCALC 53 07/04/2019 1223   LDLCALC 91 10/04/2017 1201      Wt Readings from Last 3 Encounters:  11/09/19 168 lb 9.6 oz (76.5 kg)  09/28/19 181 lb (82.1 kg)  07/07/19 184 lb (83.5 kg)      Other studies Reviewed: Additional studies/ records that were reviewed today include:   Echocardiogram 09/2019: 1. Left ventricular ejection fraction, by visual estimation, is 55 to  60%. The left ventricle has normal function. There is mildly increased  left ventricular hypertrophy.  2. Left ventricular  diastolic parameters are indeterminate.  3. The left ventricle has no regional wall motion abnormalities.  4. Global right ventricle has normal systolic function.The right  ventricular size is normal. No increase in right ventricular wall  thickness.  5. Left atrial size was moderately dilated.  6. Right atrial size was normal.  7. The mitral valve is normal in structure. Mild to moderate mitral valve  regurgitation.  8. Very eccentric anterior directed MR jet. The degree of MR may be  underestimated due to the eccentrity of the jet. The MV to AV VTI ratio is  0.6 suggesting mild MR.  9. The tricuspid valve is normal in structure. Tricuspid valve  regurgitation is trivial.  10. The aortic valve is tricuspid. Aortic valve regurgitation is not  visualized. No evidence of aortic valve sclerosis or stenosis.  11. Pulmonic regurgitation is mild.  12. The pulmonic valve was not well visualized. Pulmonic valve  regurgitation is mild.  13. The interatrial septum was not well visualized.   NST 2017:  The left ventricular ejection fraction is normal (55-65%).  Nuclear stress EF: 61%.  There was no ST segment deviation noted during stress.  No T wave inversion was noted during stress.  The study is normal.  This is a low risk study.   ASSESSMENT AND PLAN:  1. Preoperative assessment: patient is anticipating cholecystectomy vs replacement of cholecystostomy tube pending preop assessment. He has been quite debilitated over the past year and is unable to complete 4 METs . That being said he has no chest pain, SOB, or palpitations.  Derrick [name] obtaining a NST to evaluate for ischemia. If no ischemia and EF unchanged, patient would be at acceptable risk for cholecystectomy without further cardiovascular testing.  - Patient has no obstructive CAD, therefore can hold aspirin and plavix 5-7 days prior to upcoming procedure.   2. Non-obstructive CAD: no anginal complaints though is quite  limited in activity due to joint pains and weakness.  - Continue aspirin and plavix - okay to hold as above for planned procedure.  - Continue statin  3. HTN: BP 118/66 today - Continue diltiazem   4. HLD: LDL 23 10/27/19; at goal of <70 - Continue atorvastatin  5. ?Atrial fibrillation: no clear evidence on EKG or rhythm strip review from recent admission. More likely MAT vs wandering atrial pacemaker. Noted again on EKG today. Given high fall risk and ongoing anemia, would not recommend anticoagulation in the future even if he does have atrial fibrillation. Rates have been controlled with diltiazem 30mg  BID - Favor ongoing monitoring with routine EKGs for now. Could consider a cardiac monitor if patient reports palpitations.    6. CKD stage 2: Cr. 1.1 on discharge which appears to be about baseline - Continue to monitor routinely  Current medicines are reviewed at length with the patient today.  The patient does not have concerns regarding medicines.  The following changes have been made:  As above  Labs/ tests ordered today include:   Orders Placed This Encounter  Procedures   MYOCARDIAL PERFUSION IMAGING   EKG 12-Lead     Disposition:   FU with Dr. [name] in 4-6 months  Signed, Abigail Butts, PA-C  11/09/2019 11:05 AM

## 2019-11-09 ENCOUNTER — Other Ambulatory Visit: Payer: Self-pay

## 2019-11-09 ENCOUNTER — Encounter: Payer: Self-pay | Admitting: Medical

## 2019-11-09 ENCOUNTER — Ambulatory Visit: Payer: Medicare Other | Admitting: Medical

## 2019-11-09 VITALS — BP 118/66 | HR 90 | Temp 97.7°F | Ht 72.0 in | Wt 168.6 lb

## 2019-11-09 DIAGNOSIS — I2510 Atherosclerotic heart disease of native coronary artery without angina pectoris: Secondary | ICD-10-CM

## 2019-11-09 DIAGNOSIS — K810 Acute cholecystitis: Secondary | ICD-10-CM | POA: Diagnosis not present

## 2019-11-09 DIAGNOSIS — E782 Mixed hyperlipidemia: Secondary | ICD-10-CM | POA: Diagnosis not present

## 2019-11-09 DIAGNOSIS — N182 Chronic kidney disease, stage 2 (mild): Secondary | ICD-10-CM

## 2019-11-09 DIAGNOSIS — I10 Essential (primary) hypertension: Secondary | ICD-10-CM

## 2019-11-09 DIAGNOSIS — Z01810 Encounter for preprocedural cardiovascular examination: Secondary | ICD-10-CM

## 2019-11-09 DIAGNOSIS — D62 Acute posthemorrhagic anemia: Secondary | ICD-10-CM | POA: Diagnosis not present

## 2019-11-09 DIAGNOSIS — Z48815 Encounter for surgical aftercare following surgery on the digestive system: Secondary | ICD-10-CM | POA: Diagnosis not present

## 2019-11-09 NOTE — Patient Instructions (Signed)
Medication Instructions:  Your physician recommends that you continue on your current medications as directed. Please refer to the Current Medication list given to you today.  *If you need a refill on your cardiac medications before your next appointment, please call your pharmacy*  Lab Work: NONE ordered at this time of appointment   If you have labs (blood work) drawn today and your tests are completely normal, you will receive your results only by: Marland Kitchen MyChart Message (if you have MyChart) OR . A paper copy in the mail If you have any lab test that is abnormal or we need to change your treatment, we will call you to review the results.  Testing/Procedures: Your physician has requested that you have a lexiscan myoview. For further information please visit [email or web address]. Please follow instruction sheet, as given.   PLEASE SCHEDULE FOR 1-2 WEEKS  Follow-Up: At [facility], you and your health needs are our priority.  As part of our continuing mission to provide you with exceptional heart care, we have created designated Provider Care Teams.  These Care Teams include your primary Cardiologist (physician) and Advanced Practice Providers (APPs -  Physician Assistants and Nurse Practitioners) who all work together to provide you with the care you need, when you need it.  Your next appointment:   4-5 month(s)  The format for your next appointment:   In Person  Provider:   Sanda Klein, MD  Other Instructions

## 2019-11-10 DIAGNOSIS — I480 Paroxysmal atrial fibrillation: Secondary | ICD-10-CM | POA: Diagnosis not present

## 2019-11-10 DIAGNOSIS — Z48815 Encounter for surgical aftercare following surgery on the digestive system: Secondary | ICD-10-CM | POA: Diagnosis not present

## 2019-11-10 DIAGNOSIS — N183 Chronic kidney disease, stage 3 unspecified: Secondary | ICD-10-CM | POA: Diagnosis not present

## 2019-11-10 DIAGNOSIS — J449 Chronic obstructive pulmonary disease, unspecified: Secondary | ICD-10-CM | POA: Diagnosis not present

## 2019-11-10 DIAGNOSIS — H9193 Unspecified hearing loss, bilateral: Secondary | ICD-10-CM | POA: Diagnosis not present

## 2019-11-10 DIAGNOSIS — K219 Gastro-esophageal reflux disease without esophagitis: Secondary | ICD-10-CM | POA: Diagnosis not present

## 2019-11-10 DIAGNOSIS — Z9181 History of falling: Secondary | ICD-10-CM | POA: Diagnosis not present

## 2019-11-10 DIAGNOSIS — D631 Anemia in chronic kidney disease: Secondary | ICD-10-CM | POA: Diagnosis not present

## 2019-11-10 DIAGNOSIS — Z7982 Long term (current) use of aspirin: Secondary | ICD-10-CM | POA: Diagnosis not present

## 2019-11-10 DIAGNOSIS — Z4803 Encounter for change or removal of drains: Secondary | ICD-10-CM | POA: Diagnosis not present

## 2019-11-10 DIAGNOSIS — Z8673 Personal history of transient ischemic attack (TIA), and cerebral infarction without residual deficits: Secondary | ICD-10-CM | POA: Diagnosis not present

## 2019-11-10 DIAGNOSIS — I2510 Atherosclerotic heart disease of native coronary artery without angina pectoris: Secondary | ICD-10-CM | POA: Diagnosis not present

## 2019-11-10 DIAGNOSIS — Z7902 Long term (current) use of antithrombotics/antiplatelets: Secondary | ICD-10-CM | POA: Diagnosis not present

## 2019-11-10 DIAGNOSIS — I129 Hypertensive chronic kidney disease with stage 1 through stage 4 chronic kidney disease, or unspecified chronic kidney disease: Secondary | ICD-10-CM | POA: Diagnosis not present

## 2019-11-10 DIAGNOSIS — M48062 Spinal stenosis, lumbar region with neurogenic claudication: Secondary | ICD-10-CM | POA: Diagnosis not present

## 2019-11-10 DIAGNOSIS — D62 Acute posthemorrhagic anemia: Secondary | ICD-10-CM | POA: Diagnosis not present

## 2019-11-10 DIAGNOSIS — M069 Rheumatoid arthritis, unspecified: Secondary | ICD-10-CM | POA: Diagnosis not present

## 2019-11-10 DIAGNOSIS — I739 Peripheral vascular disease, unspecified: Secondary | ICD-10-CM | POA: Diagnosis not present

## 2019-11-10 DIAGNOSIS — K810 Acute cholecystitis: Secondary | ICD-10-CM | POA: Diagnosis not present

## 2019-11-11 DIAGNOSIS — I480 Paroxysmal atrial fibrillation: Secondary | ICD-10-CM | POA: Diagnosis not present

## 2019-11-11 DIAGNOSIS — D631 Anemia in chronic kidney disease: Secondary | ICD-10-CM | POA: Diagnosis not present

## 2019-11-11 DIAGNOSIS — D62 Acute posthemorrhagic anemia: Secondary | ICD-10-CM | POA: Diagnosis not present

## 2019-11-11 DIAGNOSIS — K810 Acute cholecystitis: Secondary | ICD-10-CM | POA: Diagnosis not present

## 2019-11-11 DIAGNOSIS — I129 Hypertensive chronic kidney disease with stage 1 through stage 4 chronic kidney disease, or unspecified chronic kidney disease: Secondary | ICD-10-CM | POA: Diagnosis not present

## 2019-11-11 DIAGNOSIS — Z8673 Personal history of transient ischemic attack (TIA), and cerebral infarction without residual deficits: Secondary | ICD-10-CM | POA: Diagnosis not present

## 2019-11-11 DIAGNOSIS — Z7902 Long term (current) use of antithrombotics/antiplatelets: Secondary | ICD-10-CM | POA: Diagnosis not present

## 2019-11-11 DIAGNOSIS — M069 Rheumatoid arthritis, unspecified: Secondary | ICD-10-CM | POA: Diagnosis not present

## 2019-11-11 DIAGNOSIS — Z7982 Long term (current) use of aspirin: Secondary | ICD-10-CM | POA: Diagnosis not present

## 2019-11-11 DIAGNOSIS — Z9181 History of falling: Secondary | ICD-10-CM | POA: Diagnosis not present

## 2019-11-11 DIAGNOSIS — K219 Gastro-esophageal reflux disease without esophagitis: Secondary | ICD-10-CM | POA: Diagnosis not present

## 2019-11-11 DIAGNOSIS — H9193 Unspecified hearing loss, bilateral: Secondary | ICD-10-CM | POA: Diagnosis not present

## 2019-11-11 DIAGNOSIS — N183 Chronic kidney disease, stage 3 unspecified: Secondary | ICD-10-CM | POA: Diagnosis not present

## 2019-11-11 DIAGNOSIS — I739 Peripheral vascular disease, unspecified: Secondary | ICD-10-CM | POA: Diagnosis not present

## 2019-11-11 DIAGNOSIS — Z48815 Encounter for surgical aftercare following surgery on the digestive system: Secondary | ICD-10-CM | POA: Diagnosis not present

## 2019-11-11 DIAGNOSIS — I2510 Atherosclerotic heart disease of native coronary artery without angina pectoris: Secondary | ICD-10-CM | POA: Diagnosis not present

## 2019-11-11 DIAGNOSIS — J449 Chronic obstructive pulmonary disease, unspecified: Secondary | ICD-10-CM | POA: Diagnosis not present

## 2019-11-11 DIAGNOSIS — M48062 Spinal stenosis, lumbar region with neurogenic claudication: Secondary | ICD-10-CM | POA: Diagnosis not present

## 2019-11-11 DIAGNOSIS — Z4803 Encounter for change or removal of drains: Secondary | ICD-10-CM | POA: Diagnosis not present

## 2019-11-11 NOTE — Progress Notes (Signed)
I agree. Neither the ECG nor the strips show atrial fibrillation.

## 2019-11-12 DIAGNOSIS — D62 Acute posthemorrhagic anemia: Secondary | ICD-10-CM | POA: Diagnosis not present

## 2019-11-12 DIAGNOSIS — M48062 Spinal stenosis, lumbar region with neurogenic claudication: Secondary | ICD-10-CM | POA: Diagnosis not present

## 2019-11-12 DIAGNOSIS — M069 Rheumatoid arthritis, unspecified: Secondary | ICD-10-CM | POA: Diagnosis not present

## 2019-11-12 DIAGNOSIS — Z4803 Encounter for change or removal of drains: Secondary | ICD-10-CM | POA: Diagnosis not present

## 2019-11-12 DIAGNOSIS — I2510 Atherosclerotic heart disease of native coronary artery without angina pectoris: Secondary | ICD-10-CM | POA: Diagnosis not present

## 2019-11-12 DIAGNOSIS — K219 Gastro-esophageal reflux disease without esophagitis: Secondary | ICD-10-CM | POA: Diagnosis not present

## 2019-11-12 DIAGNOSIS — I480 Paroxysmal atrial fibrillation: Secondary | ICD-10-CM | POA: Diagnosis not present

## 2019-11-12 DIAGNOSIS — J449 Chronic obstructive pulmonary disease, unspecified: Secondary | ICD-10-CM | POA: Diagnosis not present

## 2019-11-12 DIAGNOSIS — N183 Chronic kidney disease, stage 3 unspecified: Secondary | ICD-10-CM | POA: Diagnosis not present

## 2019-11-12 DIAGNOSIS — Z7902 Long term (current) use of antithrombotics/antiplatelets: Secondary | ICD-10-CM | POA: Diagnosis not present

## 2019-11-12 DIAGNOSIS — I129 Hypertensive chronic kidney disease with stage 1 through stage 4 chronic kidney disease, or unspecified chronic kidney disease: Secondary | ICD-10-CM | POA: Diagnosis not present

## 2019-11-12 DIAGNOSIS — D631 Anemia in chronic kidney disease: Secondary | ICD-10-CM | POA: Diagnosis not present

## 2019-11-12 DIAGNOSIS — Z48815 Encounter for surgical aftercare following surgery on the digestive system: Secondary | ICD-10-CM | POA: Diagnosis not present

## 2019-11-12 DIAGNOSIS — Z9181 History of falling: Secondary | ICD-10-CM | POA: Diagnosis not present

## 2019-11-12 DIAGNOSIS — K810 Acute cholecystitis: Secondary | ICD-10-CM | POA: Diagnosis not present

## 2019-11-12 DIAGNOSIS — H9193 Unspecified hearing loss, bilateral: Secondary | ICD-10-CM | POA: Diagnosis not present

## 2019-11-12 DIAGNOSIS — I739 Peripheral vascular disease, unspecified: Secondary | ICD-10-CM | POA: Diagnosis not present

## 2019-11-12 DIAGNOSIS — Z7982 Long term (current) use of aspirin: Secondary | ICD-10-CM | POA: Diagnosis not present

## 2019-11-12 DIAGNOSIS — Z8673 Personal history of transient ischemic attack (TIA), and cerebral infarction without residual deficits: Secondary | ICD-10-CM | POA: Diagnosis not present

## 2019-11-13 DIAGNOSIS — D509 Iron deficiency anemia, unspecified: Secondary | ICD-10-CM | POA: Diagnosis not present

## 2019-11-16 DIAGNOSIS — M069 Rheumatoid arthritis, unspecified: Secondary | ICD-10-CM | POA: Diagnosis not present

## 2019-11-16 DIAGNOSIS — I739 Peripheral vascular disease, unspecified: Secondary | ICD-10-CM | POA: Diagnosis not present

## 2019-11-16 DIAGNOSIS — I480 Paroxysmal atrial fibrillation: Secondary | ICD-10-CM | POA: Diagnosis not present

## 2019-11-16 DIAGNOSIS — K219 Gastro-esophageal reflux disease without esophagitis: Secondary | ICD-10-CM | POA: Diagnosis not present

## 2019-11-16 DIAGNOSIS — H9193 Unspecified hearing loss, bilateral: Secondary | ICD-10-CM | POA: Diagnosis not present

## 2019-11-16 DIAGNOSIS — I2510 Atherosclerotic heart disease of native coronary artery without angina pectoris: Secondary | ICD-10-CM | POA: Diagnosis not present

## 2019-11-16 DIAGNOSIS — Z7982 Long term (current) use of aspirin: Secondary | ICD-10-CM | POA: Diagnosis not present

## 2019-11-16 DIAGNOSIS — Z7902 Long term (current) use of antithrombotics/antiplatelets: Secondary | ICD-10-CM | POA: Diagnosis not present

## 2019-11-16 DIAGNOSIS — D62 Acute posthemorrhagic anemia: Secondary | ICD-10-CM | POA: Diagnosis not present

## 2019-11-16 DIAGNOSIS — Z8673 Personal history of transient ischemic attack (TIA), and cerebral infarction without residual deficits: Secondary | ICD-10-CM | POA: Diagnosis not present

## 2019-11-16 DIAGNOSIS — I129 Hypertensive chronic kidney disease with stage 1 through stage 4 chronic kidney disease, or unspecified chronic kidney disease: Secondary | ICD-10-CM | POA: Diagnosis not present

## 2019-11-16 DIAGNOSIS — Z9181 History of falling: Secondary | ICD-10-CM | POA: Diagnosis not present

## 2019-11-16 DIAGNOSIS — M48062 Spinal stenosis, lumbar region with neurogenic claudication: Secondary | ICD-10-CM | POA: Diagnosis not present

## 2019-11-16 DIAGNOSIS — N183 Chronic kidney disease, stage 3 unspecified: Secondary | ICD-10-CM | POA: Diagnosis not present

## 2019-11-16 DIAGNOSIS — Z48815 Encounter for surgical aftercare following surgery on the digestive system: Secondary | ICD-10-CM | POA: Diagnosis not present

## 2019-11-16 DIAGNOSIS — K810 Acute cholecystitis: Secondary | ICD-10-CM | POA: Diagnosis not present

## 2019-11-16 DIAGNOSIS — J449 Chronic obstructive pulmonary disease, unspecified: Secondary | ICD-10-CM | POA: Diagnosis not present

## 2019-11-16 DIAGNOSIS — Z4803 Encounter for change or removal of drains: Secondary | ICD-10-CM | POA: Diagnosis not present

## 2019-11-16 DIAGNOSIS — D631 Anemia in chronic kidney disease: Secondary | ICD-10-CM | POA: Diagnosis not present

## 2019-11-17 ENCOUNTER — Ambulatory Visit: Payer: Medicare Other | Attending: Internal Medicine

## 2019-11-17 DIAGNOSIS — Z23 Encounter for immunization: Secondary | ICD-10-CM

## 2019-11-17 NOTE — Progress Notes (Signed)
   Covid-19 Vaccination Clinic  Name:  Derrick [name]    MRN: [id number] DOB: November 15, 1931  11/17/2019  Mr. Derrick [name] was observed post Covid-19 immunization for 15 minutes without incidence. He was provided with Vaccine Information Sheet and instruction to access the V-Safe system.   Mr. Derrick [name] was instructed to call 911 with any severe reactions post vaccine: Marland Kitchen Difficulty breathing  . Swelling of your face and throat  . A fast heartbeat  . A bad rash all over your body  . Dizziness and weakness    Immunizations Administered    Name Date Dose VIS Date Route   Pfizer COVID-19 Vaccine 11/17/2019  1:11 PM 0.3 mL 09/18/2019 Intramuscular   Manufacturer: [place]   Lot: [id number]   [place]: [id number]

## 2019-11-18 ENCOUNTER — Telehealth (HOSPITAL_COMMUNITY): Payer: Self-pay

## 2019-11-18 DIAGNOSIS — Z9181 History of falling: Secondary | ICD-10-CM | POA: Diagnosis not present

## 2019-11-18 DIAGNOSIS — J449 Chronic obstructive pulmonary disease, unspecified: Secondary | ICD-10-CM | POA: Diagnosis not present

## 2019-11-18 DIAGNOSIS — D62 Acute posthemorrhagic anemia: Secondary | ICD-10-CM | POA: Diagnosis not present

## 2019-11-18 DIAGNOSIS — Z48815 Encounter for surgical aftercare following surgery on the digestive system: Secondary | ICD-10-CM | POA: Diagnosis not present

## 2019-11-18 DIAGNOSIS — Z4803 Encounter for change or removal of drains: Secondary | ICD-10-CM | POA: Diagnosis not present

## 2019-11-18 DIAGNOSIS — K219 Gastro-esophageal reflux disease without esophagitis: Secondary | ICD-10-CM | POA: Diagnosis not present

## 2019-11-18 DIAGNOSIS — I2510 Atherosclerotic heart disease of native coronary artery without angina pectoris: Secondary | ICD-10-CM | POA: Diagnosis not present

## 2019-11-18 DIAGNOSIS — I739 Peripheral vascular disease, unspecified: Secondary | ICD-10-CM | POA: Diagnosis not present

## 2019-11-18 DIAGNOSIS — D631 Anemia in chronic kidney disease: Secondary | ICD-10-CM | POA: Diagnosis not present

## 2019-11-18 DIAGNOSIS — N183 Chronic kidney disease, stage 3 unspecified: Secondary | ICD-10-CM | POA: Diagnosis not present

## 2019-11-18 DIAGNOSIS — K810 Acute cholecystitis: Secondary | ICD-10-CM | POA: Diagnosis not present

## 2019-11-18 DIAGNOSIS — Z7982 Long term (current) use of aspirin: Secondary | ICD-10-CM | POA: Diagnosis not present

## 2019-11-18 DIAGNOSIS — I129 Hypertensive chronic kidney disease with stage 1 through stage 4 chronic kidney disease, or unspecified chronic kidney disease: Secondary | ICD-10-CM | POA: Diagnosis not present

## 2019-11-18 DIAGNOSIS — Z7902 Long term (current) use of antithrombotics/antiplatelets: Secondary | ICD-10-CM | POA: Diagnosis not present

## 2019-11-18 DIAGNOSIS — I480 Paroxysmal atrial fibrillation: Secondary | ICD-10-CM | POA: Diagnosis not present

## 2019-11-18 DIAGNOSIS — Z8673 Personal history of transient ischemic attack (TIA), and cerebral infarction without residual deficits: Secondary | ICD-10-CM | POA: Diagnosis not present

## 2019-11-18 DIAGNOSIS — M48062 Spinal stenosis, lumbar region with neurogenic claudication: Secondary | ICD-10-CM | POA: Diagnosis not present

## 2019-11-18 DIAGNOSIS — H9193 Unspecified hearing loss, bilateral: Secondary | ICD-10-CM | POA: Diagnosis not present

## 2019-11-18 DIAGNOSIS — M069 Rheumatoid arthritis, unspecified: Secondary | ICD-10-CM | POA: Diagnosis not present

## 2019-11-18 NOTE — Telephone Encounter (Signed)
Encounter complete. 

## 2019-11-19 ENCOUNTER — Telehealth (HOSPITAL_COMMUNITY): Payer: Self-pay

## 2019-11-19 DIAGNOSIS — M069 Rheumatoid arthritis, unspecified: Secondary | ICD-10-CM | POA: Diagnosis not present

## 2019-11-19 DIAGNOSIS — N183 Chronic kidney disease, stage 3 unspecified: Secondary | ICD-10-CM | POA: Diagnosis not present

## 2019-11-19 DIAGNOSIS — K810 Acute cholecystitis: Secondary | ICD-10-CM | POA: Diagnosis not present

## 2019-11-19 DIAGNOSIS — K219 Gastro-esophageal reflux disease without esophagitis: Secondary | ICD-10-CM | POA: Diagnosis not present

## 2019-11-19 DIAGNOSIS — Z7902 Long term (current) use of antithrombotics/antiplatelets: Secondary | ICD-10-CM | POA: Diagnosis not present

## 2019-11-19 DIAGNOSIS — I2510 Atherosclerotic heart disease of native coronary artery without angina pectoris: Secondary | ICD-10-CM | POA: Diagnosis not present

## 2019-11-19 DIAGNOSIS — D631 Anemia in chronic kidney disease: Secondary | ICD-10-CM | POA: Diagnosis not present

## 2019-11-19 DIAGNOSIS — Z7982 Long term (current) use of aspirin: Secondary | ICD-10-CM | POA: Diagnosis not present

## 2019-11-19 DIAGNOSIS — M48062 Spinal stenosis, lumbar region with neurogenic claudication: Secondary | ICD-10-CM | POA: Diagnosis not present

## 2019-11-19 DIAGNOSIS — Z8673 Personal history of transient ischemic attack (TIA), and cerebral infarction without residual deficits: Secondary | ICD-10-CM | POA: Diagnosis not present

## 2019-11-19 DIAGNOSIS — Z9181 History of falling: Secondary | ICD-10-CM | POA: Diagnosis not present

## 2019-11-19 DIAGNOSIS — I480 Paroxysmal atrial fibrillation: Secondary | ICD-10-CM | POA: Diagnosis not present

## 2019-11-19 DIAGNOSIS — Z48815 Encounter for surgical aftercare following surgery on the digestive system: Secondary | ICD-10-CM | POA: Diagnosis not present

## 2019-11-19 DIAGNOSIS — J449 Chronic obstructive pulmonary disease, unspecified: Secondary | ICD-10-CM | POA: Diagnosis not present

## 2019-11-19 DIAGNOSIS — H9193 Unspecified hearing loss, bilateral: Secondary | ICD-10-CM | POA: Diagnosis not present

## 2019-11-19 DIAGNOSIS — I739 Peripheral vascular disease, unspecified: Secondary | ICD-10-CM | POA: Diagnosis not present

## 2019-11-19 DIAGNOSIS — D62 Acute posthemorrhagic anemia: Secondary | ICD-10-CM | POA: Diagnosis not present

## 2019-11-19 DIAGNOSIS — I129 Hypertensive chronic kidney disease with stage 1 through stage 4 chronic kidney disease, or unspecified chronic kidney disease: Secondary | ICD-10-CM | POA: Diagnosis not present

## 2019-11-19 DIAGNOSIS — Z4803 Encounter for change or removal of drains: Secondary | ICD-10-CM | POA: Diagnosis not present

## 2019-11-19 NOTE — Telephone Encounter (Signed)
Encounter complete. 

## 2019-11-20 ENCOUNTER — Ambulatory Visit (HOSPITAL_COMMUNITY)
Admission: RE | Admit: 2019-11-20 | Discharge: 2019-11-20 | Disposition: A | Discharge status: Home / Self Care | Payer: Medicare Other | Source: Ambulatory Visit | Attending: Cardiology | Admitting: Cardiology

## 2019-11-20 ENCOUNTER — Other Ambulatory Visit: Payer: Self-pay

## 2019-11-20 DIAGNOSIS — Z01810 Encounter for preprocedural cardiovascular examination: Secondary | ICD-10-CM | POA: Diagnosis not present

## 2019-11-20 DIAGNOSIS — I2510 Atherosclerotic heart disease of native coronary artery without angina pectoris: Secondary | ICD-10-CM | POA: Diagnosis not present

## 2019-11-20 LAB — MYOCARDIAL PERFUSION IMAGING
LV dias vol: 95 mL (ref 62–150)
LV sys vol: 41 mL
Peak HR: 104 {beats}/min
Rest HR: 91 {beats}/min
SDS: 2
SRS: 1
SSS: 3
TID: 0.87

## 2019-11-20 MED ORDER — TECHNETIUM TC 99M TETROFOSMIN IV KIT
10.1000 | PACK | Freq: Once | INTRAVENOUS | Status: AC | PRN
  Administered 2019-11-20: 10.1 via INTRAVENOUS
  Filled 2019-11-20: qty 11

## 2019-11-20 MED ORDER — TECHNETIUM TC 99M TETROFOSMIN IV KIT
33.0000 | PACK | Freq: Once | INTRAVENOUS | Status: AC | PRN
  Administered 2019-11-20: 33 via INTRAVENOUS
  Filled 2019-11-20: qty 33

## 2019-11-20 MED ORDER — REGADENOSON 0.4 MG/5ML IV SOLN
0.4000 mg | Freq: Once | INTRAVENOUS | Status: AC
  Administered 2019-11-20: 14:00:00 0.4 mg via INTRAVENOUS

## 2019-11-23 ENCOUNTER — Other Ambulatory Visit (HOSPITAL_COMMUNITY): Payer: Self-pay | Admitting: Radiology

## 2019-11-23 ENCOUNTER — Other Ambulatory Visit (HOSPITAL_COMMUNITY): Payer: Self-pay | Admitting: Interventional Radiology

## 2019-11-23 ENCOUNTER — Ambulatory Visit (HOSPITAL_COMMUNITY)
Admission: RE | Admit: 2019-11-23 | Discharge: 2019-11-23 | Disposition: A | Discharge status: Home / Self Care | Payer: Medicare Other | Source: Ambulatory Visit | Attending: Radiology | Admitting: Radiology

## 2019-11-23 ENCOUNTER — Other Ambulatory Visit: Payer: Self-pay

## 2019-11-23 DIAGNOSIS — K819 Cholecystitis, unspecified: Secondary | ICD-10-CM | POA: Insufficient documentation

## 2019-11-23 DIAGNOSIS — D62 Acute posthemorrhagic anemia: Secondary | ICD-10-CM | POA: Diagnosis not present

## 2019-11-23 DIAGNOSIS — N183 Chronic kidney disease, stage 3 unspecified: Secondary | ICD-10-CM | POA: Diagnosis not present

## 2019-11-23 DIAGNOSIS — Z4803 Encounter for change or removal of drains: Secondary | ICD-10-CM | POA: Insufficient documentation

## 2019-11-23 DIAGNOSIS — H9193 Unspecified hearing loss, bilateral: Secondary | ICD-10-CM | POA: Diagnosis not present

## 2019-11-23 DIAGNOSIS — Z9181 History of falling: Secondary | ICD-10-CM | POA: Diagnosis not present

## 2019-11-23 DIAGNOSIS — I739 Peripheral vascular disease, unspecified: Secondary | ICD-10-CM | POA: Diagnosis not present

## 2019-11-23 DIAGNOSIS — K810 Acute cholecystitis: Secondary | ICD-10-CM | POA: Diagnosis not present

## 2019-11-23 DIAGNOSIS — M069 Rheumatoid arthritis, unspecified: Secondary | ICD-10-CM | POA: Diagnosis not present

## 2019-11-23 DIAGNOSIS — Z48815 Encounter for surgical aftercare following surgery on the digestive system: Secondary | ICD-10-CM | POA: Diagnosis not present

## 2019-11-23 DIAGNOSIS — M48062 Spinal stenosis, lumbar region with neurogenic claudication: Secondary | ICD-10-CM | POA: Diagnosis not present

## 2019-11-23 DIAGNOSIS — Z7982 Long term (current) use of aspirin: Secondary | ICD-10-CM | POA: Diagnosis not present

## 2019-11-23 DIAGNOSIS — J449 Chronic obstructive pulmonary disease, unspecified: Secondary | ICD-10-CM | POA: Diagnosis not present

## 2019-11-23 DIAGNOSIS — I129 Hypertensive chronic kidney disease with stage 1 through stage 4 chronic kidney disease, or unspecified chronic kidney disease: Secondary | ICD-10-CM | POA: Diagnosis not present

## 2019-11-23 DIAGNOSIS — K219 Gastro-esophageal reflux disease without esophagitis: Secondary | ICD-10-CM | POA: Diagnosis not present

## 2019-11-23 DIAGNOSIS — Z7902 Long term (current) use of antithrombotics/antiplatelets: Secondary | ICD-10-CM | POA: Diagnosis not present

## 2019-11-23 DIAGNOSIS — I2510 Atherosclerotic heart disease of native coronary artery without angina pectoris: Secondary | ICD-10-CM | POA: Diagnosis not present

## 2019-11-23 DIAGNOSIS — Z8673 Personal history of transient ischemic attack (TIA), and cerebral infarction without residual deficits: Secondary | ICD-10-CM | POA: Diagnosis not present

## 2019-11-23 DIAGNOSIS — I480 Paroxysmal atrial fibrillation: Secondary | ICD-10-CM | POA: Diagnosis not present

## 2019-11-23 DIAGNOSIS — Z434 Encounter for attention to other artificial openings of digestive tract: Secondary | ICD-10-CM | POA: Diagnosis not present

## 2019-11-23 DIAGNOSIS — D631 Anemia in chronic kidney disease: Secondary | ICD-10-CM | POA: Diagnosis not present

## 2019-11-23 HISTORY — PX: IR CATHETER TUBE CHANGE: IMG717

## 2019-11-23 MED ORDER — IOHEXOL 300 MG/ML  SOLN
50.0000 mL | Freq: Once | INTRAMUSCULAR | Status: AC | PRN
  Administered 2019-11-23: 15 mL

## 2019-11-23 MED ORDER — LIDOCAINE HCL 1 % IJ SOLN
INTRAMUSCULAR | Status: AC
  Filled 2019-11-23: qty 20

## 2019-11-23 MED ORDER — IOHEXOL 300 MG/ML  SOLN: 50.0000 mL | Freq: Once | INTRAMUSCULAR | Status: DC | PRN

## 2019-11-23 NOTE — Procedures (Signed)
Pre procedural Dx: Acute Cholelithiasis. Post procedural Dx: Same  Technically successful Fluoro guided exchange of 10 Fr chole tube. The chole tube was capped for a trial of internalization.   EBL: None Complications: None immediate  Ronny Bacon, MD Pager #: [phone]

## 2019-11-23 NOTE — Telephone Encounter (Signed)
   Primary Cardiologist: Sanda Klein, MD  Recently seen in office for preop evaluation. NST obtained which was deemed low risk with no evidence of ischemia.   Given past medical history and time since last visit, based on ACC/AHA guidelines, CRISOFORO RADWAN would be at acceptable risk for the planned procedure without further cardiovascular testing.   I will route this recommendation to the requesting party via Epic fax function and remove from pre-op pool.  Please call with questions.  Abigail Butts, PA-C 11/23/2019, 12:36 AM

## 2019-11-24 DIAGNOSIS — D509 Iron deficiency anemia, unspecified: Secondary | ICD-10-CM | POA: Diagnosis not present

## 2019-11-25 ENCOUNTER — Ambulatory Visit: Payer: Self-pay | Admitting: General Surgery

## 2019-12-02 DIAGNOSIS — Z48815 Encounter for surgical aftercare following surgery on the digestive system: Secondary | ICD-10-CM | POA: Diagnosis not present

## 2019-12-02 DIAGNOSIS — H9193 Unspecified hearing loss, bilateral: Secondary | ICD-10-CM | POA: Diagnosis not present

## 2019-12-02 DIAGNOSIS — M48062 Spinal stenosis, lumbar region with neurogenic claudication: Secondary | ICD-10-CM | POA: Diagnosis not present

## 2019-12-02 DIAGNOSIS — Z8673 Personal history of transient ischemic attack (TIA), and cerebral infarction without residual deficits: Secondary | ICD-10-CM | POA: Diagnosis not present

## 2019-12-02 DIAGNOSIS — I129 Hypertensive chronic kidney disease with stage 1 through stage 4 chronic kidney disease, or unspecified chronic kidney disease: Secondary | ICD-10-CM | POA: Diagnosis not present

## 2019-12-02 DIAGNOSIS — N183 Chronic kidney disease, stage 3 unspecified: Secondary | ICD-10-CM | POA: Diagnosis not present

## 2019-12-02 DIAGNOSIS — I2510 Atherosclerotic heart disease of native coronary artery without angina pectoris: Secondary | ICD-10-CM | POA: Diagnosis not present

## 2019-12-02 DIAGNOSIS — I739 Peripheral vascular disease, unspecified: Secondary | ICD-10-CM | POA: Diagnosis not present

## 2019-12-02 DIAGNOSIS — J449 Chronic obstructive pulmonary disease, unspecified: Secondary | ICD-10-CM | POA: Diagnosis not present

## 2019-12-02 DIAGNOSIS — K810 Acute cholecystitis: Secondary | ICD-10-CM | POA: Diagnosis not present

## 2019-12-02 DIAGNOSIS — I480 Paroxysmal atrial fibrillation: Secondary | ICD-10-CM | POA: Diagnosis not present

## 2019-12-02 DIAGNOSIS — Z9181 History of falling: Secondary | ICD-10-CM | POA: Diagnosis not present

## 2019-12-02 DIAGNOSIS — D62 Acute posthemorrhagic anemia: Secondary | ICD-10-CM | POA: Diagnosis not present

## 2019-12-02 DIAGNOSIS — Z7902 Long term (current) use of antithrombotics/antiplatelets: Secondary | ICD-10-CM | POA: Diagnosis not present

## 2019-12-02 DIAGNOSIS — K219 Gastro-esophageal reflux disease without esophagitis: Secondary | ICD-10-CM | POA: Diagnosis not present

## 2019-12-02 DIAGNOSIS — D631 Anemia in chronic kidney disease: Secondary | ICD-10-CM | POA: Diagnosis not present

## 2019-12-02 DIAGNOSIS — Z4803 Encounter for change or removal of drains: Secondary | ICD-10-CM | POA: Diagnosis not present

## 2019-12-02 DIAGNOSIS — Z7982 Long term (current) use of aspirin: Secondary | ICD-10-CM | POA: Diagnosis not present

## 2019-12-02 DIAGNOSIS — M069 Rheumatoid arthritis, unspecified: Secondary | ICD-10-CM | POA: Diagnosis not present

## 2019-12-03 MED FILL — NORMAL SALINE FLUSH SYRINGE: 0.9 | 30 days supply | Qty: 300 | Fill #0

## 2019-12-07 ENCOUNTER — Other Ambulatory Visit (HOSPITAL_COMMUNITY): Payer: Self-pay | Admitting: General Surgery

## 2019-12-07 ENCOUNTER — Other Ambulatory Visit (HOSPITAL_COMMUNITY): Payer: Self-pay | Admitting: Interventional Radiology

## 2019-12-07 DIAGNOSIS — K819 Cholecystitis, unspecified: Secondary | ICD-10-CM

## 2019-12-10 ENCOUNTER — Ambulatory Visit (HOSPITAL_COMMUNITY): Admission: RE | Admit: 2019-12-10 | Payer: Medicare Other | Source: Ambulatory Visit

## 2019-12-10 ENCOUNTER — Encounter (HOSPITAL_COMMUNITY): Payer: Self-pay

## 2019-12-10 ENCOUNTER — Emergency Department (HOSPITAL_COMMUNITY): Payer: Medicare Other

## 2019-12-10 ENCOUNTER — Inpatient Hospital Stay (HOSPITAL_COMMUNITY)
Admission: EM | Admit: 2019-12-10 | Discharge: 2019-12-19 | DRG: 871 | Disposition: A | Discharge status: Hospice | Payer: Medicare Other | Attending: Internal Medicine | Admitting: Internal Medicine

## 2019-12-10 ENCOUNTER — Other Ambulatory Visit: Payer: Self-pay

## 2019-12-10 DIAGNOSIS — Z85528 Personal history of other malignant neoplasm of kidney: Secondary | ICD-10-CM

## 2019-12-10 DIAGNOSIS — D649 Anemia, unspecified: Secondary | ICD-10-CM

## 2019-12-10 DIAGNOSIS — Z87891 Personal history of nicotine dependence: Secondary | ICD-10-CM

## 2019-12-10 DIAGNOSIS — A4151 Sepsis due to Escherichia coli [E. coli]: Secondary | ICD-10-CM

## 2019-12-10 DIAGNOSIS — R011 Cardiac murmur, unspecified: Secondary | ICD-10-CM | POA: Diagnosis not present

## 2019-12-10 DIAGNOSIS — A4181 Sepsis due to Enterococcus: Secondary | ICD-10-CM | POA: Diagnosis not present

## 2019-12-10 DIAGNOSIS — Z20822 Contact with and (suspected) exposure to covid-19: Secondary | ICD-10-CM | POA: Diagnosis not present

## 2019-12-10 DIAGNOSIS — Z743 Need for continuous supervision: Secondary | ICD-10-CM | POA: Diagnosis not present

## 2019-12-10 DIAGNOSIS — G9341 Metabolic encephalopathy: Secondary | ICD-10-CM | POA: Diagnosis not present

## 2019-12-10 DIAGNOSIS — Z803 Family history of malignant neoplasm of breast: Secondary | ICD-10-CM

## 2019-12-10 DIAGNOSIS — I129 Hypertensive chronic kidney disease with stage 1 through stage 4 chronic kidney disease, or unspecified chronic kidney disease: Secondary | ICD-10-CM | POA: Diagnosis present

## 2019-12-10 DIAGNOSIS — I2510 Atherosclerotic heart disease of native coronary artery without angina pectoris: Secondary | ICD-10-CM | POA: Diagnosis not present

## 2019-12-10 DIAGNOSIS — Z8673 Personal history of transient ischemic attack (TIA), and cerebral infarction without residual deficits: Secondary | ICD-10-CM | POA: Diagnosis not present

## 2019-12-10 DIAGNOSIS — A419 Sepsis, unspecified organism: Secondary | ICD-10-CM | POA: Diagnosis not present

## 2019-12-10 DIAGNOSIS — Z515 Encounter for palliative care: Secondary | ICD-10-CM

## 2019-12-10 DIAGNOSIS — Z825 Family history of asthma and other chronic lower respiratory diseases: Secondary | ICD-10-CM

## 2019-12-10 DIAGNOSIS — K5790 Diverticulosis of intestine, part unspecified, without perforation or abscess without bleeding: Secondary | ICD-10-CM | POA: Diagnosis not present

## 2019-12-10 DIAGNOSIS — I340 Nonrheumatic mitral (valve) insufficiency: Secondary | ICD-10-CM | POA: Diagnosis not present

## 2019-12-10 DIAGNOSIS — N1831 Chronic kidney disease, stage 3a: Secondary | ICD-10-CM | POA: Diagnosis not present

## 2019-12-10 DIAGNOSIS — Z8249 Family history of ischemic heart disease and other diseases of the circulatory system: Secondary | ICD-10-CM | POA: Diagnosis not present

## 2019-12-10 DIAGNOSIS — Z85520 Personal history of malignant carcinoid tumor of kidney: Secondary | ICD-10-CM | POA: Diagnosis not present

## 2019-12-10 DIAGNOSIS — R338 Other retention of urine: Secondary | ICD-10-CM | POA: Diagnosis not present

## 2019-12-10 DIAGNOSIS — D631 Anemia in chronic kidney disease: Secondary | ICD-10-CM | POA: Diagnosis present

## 2019-12-10 DIAGNOSIS — Z841 Family history of disorders of kidney and ureter: Secondary | ICD-10-CM

## 2019-12-10 DIAGNOSIS — R531 Weakness: Secondary | ICD-10-CM | POA: Diagnosis not present

## 2019-12-10 DIAGNOSIS — R4189 Other symptoms and signs involving cognitive functions and awareness: Secondary | ICD-10-CM | POA: Diagnosis present

## 2019-12-10 DIAGNOSIS — M4802 Spinal stenosis, cervical region: Secondary | ICD-10-CM | POA: Diagnosis present

## 2019-12-10 DIAGNOSIS — N179 Acute kidney failure, unspecified: Secondary | ICD-10-CM

## 2019-12-10 DIAGNOSIS — M069 Rheumatoid arthritis, unspecified: Secondary | ICD-10-CM | POA: Diagnosis not present

## 2019-12-10 DIAGNOSIS — Z79899 Other long term (current) drug therapy: Secondary | ICD-10-CM

## 2019-12-10 DIAGNOSIS — I10 Essential (primary) hypertension: Secondary | ICD-10-CM | POA: Diagnosis not present

## 2019-12-10 DIAGNOSIS — Z888 Allergy status to other drugs, medicaments and biological substances status: Secondary | ICD-10-CM

## 2019-12-10 DIAGNOSIS — I739 Peripheral vascular disease, unspecified: Secondary | ICD-10-CM | POA: Diagnosis present

## 2019-12-10 DIAGNOSIS — B952 Enterococcus as the cause of diseases classified elsewhere: Secondary | ICD-10-CM

## 2019-12-10 DIAGNOSIS — Z905 Acquired absence of kidney: Secondary | ICD-10-CM | POA: Diagnosis not present

## 2019-12-10 DIAGNOSIS — E7800 Pure hypercholesterolemia, unspecified: Secondary | ICD-10-CM | POA: Diagnosis not present

## 2019-12-10 DIAGNOSIS — R6520 Severe sepsis without septic shock: Secondary | ICD-10-CM | POA: Diagnosis not present

## 2019-12-10 DIAGNOSIS — Z66 Do not resuscitate: Secondary | ICD-10-CM | POA: Diagnosis not present

## 2019-12-10 DIAGNOSIS — Z7982 Long term (current) use of aspirin: Secondary | ICD-10-CM

## 2019-12-10 DIAGNOSIS — K429 Umbilical hernia without obstruction or gangrene: Secondary | ICD-10-CM | POA: Diagnosis not present

## 2019-12-10 DIAGNOSIS — J810 Acute pulmonary edema: Secondary | ICD-10-CM | POA: Diagnosis present

## 2019-12-10 DIAGNOSIS — E785 Hyperlipidemia, unspecified: Secondary | ICD-10-CM | POA: Diagnosis present

## 2019-12-10 DIAGNOSIS — Z823 Family history of stroke: Secondary | ICD-10-CM

## 2019-12-10 DIAGNOSIS — N12 Tubulo-interstitial nephritis, not specified as acute or chronic: Secondary | ICD-10-CM | POA: Diagnosis not present

## 2019-12-10 DIAGNOSIS — J9601 Acute respiratory failure with hypoxia: Secondary | ICD-10-CM | POA: Diagnosis not present

## 2019-12-10 DIAGNOSIS — R918 Other nonspecific abnormal finding of lung field: Secondary | ICD-10-CM | POA: Diagnosis not present

## 2019-12-10 DIAGNOSIS — N400 Enlarged prostate without lower urinary tract symptoms: Secondary | ICD-10-CM | POA: Diagnosis not present

## 2019-12-10 DIAGNOSIS — Z978 Presence of other specified devices: Secondary | ICD-10-CM | POA: Diagnosis not present

## 2019-12-10 DIAGNOSIS — I4891 Unspecified atrial fibrillation: Secondary | ICD-10-CM | POA: Diagnosis present

## 2019-12-10 DIAGNOSIS — F329 Major depressive disorder, single episode, unspecified: Secondary | ICD-10-CM | POA: Diagnosis not present

## 2019-12-10 DIAGNOSIS — N1330 Unspecified hydronephrosis: Secondary | ICD-10-CM | POA: Diagnosis not present

## 2019-12-10 DIAGNOSIS — Z7902 Long term (current) use of antithrombotics/antiplatelets: Secondary | ICD-10-CM

## 2019-12-10 DIAGNOSIS — R29818 Other symptoms and signs involving the nervous system: Secondary | ICD-10-CM | POA: Diagnosis not present

## 2019-12-10 DIAGNOSIS — K219 Gastro-esophageal reflux disease without esophagitis: Secondary | ICD-10-CM | POA: Diagnosis not present

## 2019-12-10 DIAGNOSIS — R7881 Bacteremia: Secondary | ICD-10-CM | POA: Diagnosis not present

## 2019-12-10 DIAGNOSIS — R000 Tachycardia, unspecified: Secondary | ICD-10-CM | POA: Diagnosis not present

## 2019-12-10 DIAGNOSIS — N189 Chronic kidney disease, unspecified: Secondary | ICD-10-CM | POA: Diagnosis present

## 2019-12-10 DIAGNOSIS — R404 Transient alteration of awareness: Secondary | ICD-10-CM

## 2019-12-10 DIAGNOSIS — K819 Cholecystitis, unspecified: Secondary | ICD-10-CM

## 2019-12-10 DIAGNOSIS — J9690 Respiratory failure, unspecified, unspecified whether with hypoxia or hypercapnia: Secondary | ICD-10-CM | POA: Diagnosis not present

## 2019-12-10 DIAGNOSIS — E876 Hypokalemia: Secondary | ICD-10-CM | POA: Diagnosis not present

## 2019-12-10 DIAGNOSIS — M059 Rheumatoid arthritis with rheumatoid factor, unspecified: Secondary | ICD-10-CM | POA: Diagnosis not present

## 2019-12-10 DIAGNOSIS — N136 Pyonephrosis: Secondary | ICD-10-CM | POA: Diagnosis present

## 2019-12-10 DIAGNOSIS — R4182 Altered mental status, unspecified: Secondary | ICD-10-CM | POA: Diagnosis present

## 2019-12-10 DIAGNOSIS — G4700 Insomnia, unspecified: Secondary | ICD-10-CM | POA: Diagnosis not present

## 2019-12-10 DIAGNOSIS — R0603 Acute respiratory distress: Secondary | ICD-10-CM

## 2019-12-10 DIAGNOSIS — N1832 Chronic kidney disease, stage 3b: Secondary | ICD-10-CM | POA: Diagnosis present

## 2019-12-10 DIAGNOSIS — N401 Enlarged prostate with lower urinary tract symptoms: Secondary | ICD-10-CM | POA: Diagnosis present

## 2019-12-10 DIAGNOSIS — M47812 Spondylosis without myelopathy or radiculopathy, cervical region: Secondary | ICD-10-CM | POA: Diagnosis not present

## 2019-12-10 DIAGNOSIS — Z7189 Other specified counseling: Secondary | ICD-10-CM | POA: Diagnosis not present

## 2019-12-10 DIAGNOSIS — Z806 Family history of leukemia: Secondary | ICD-10-CM | POA: Diagnosis not present

## 2019-12-10 DIAGNOSIS — Z8546 Personal history of malignant neoplasm of prostate: Secondary | ICD-10-CM | POA: Diagnosis not present

## 2019-12-10 DIAGNOSIS — F32A Depression, unspecified: Secondary | ICD-10-CM | POA: Diagnosis present

## 2019-12-10 LAB — URINALYSIS, ROUTINE W REFLEX MICROSCOPIC
Bacteria, UA: NONE SEEN
Bilirubin Urine: NEGATIVE
Glucose, UA: NEGATIVE mg/dL
Hgb urine dipstick: NEGATIVE
Ketones, ur: NEGATIVE mg/dL
Leukocytes,Ua: NEGATIVE
Nitrite: NEGATIVE
Protein, ur: 100 mg/dL — AB
Specific Gravity, Urine: 1.028 (ref 1.005–1.030)
pH: 5 (ref 5.0–8.0)

## 2019-12-10 LAB — CBC WITH DIFFERENTIAL/PLATELET
Abs Immature Granulocytes: 0.09 10*3/uL — ABNORMAL HIGH (ref 0.00–0.07)
Basophils Absolute: 0 10*3/uL (ref 0.0–0.1)
Basophils Relative: 0 %
Eosinophils Absolute: 0 10*3/uL (ref 0.0–0.5)
Eosinophils Relative: 0 %
HCT: 37.9 % — ABNORMAL LOW (ref 39.0–52.0)
Hemoglobin: 11.8 g/dL — ABNORMAL LOW (ref 13.0–17.0)
Immature Granulocytes: 1 %
Lymphocytes Relative: 4 %
Lymphs Abs: 0.8 10*3/uL (ref 0.7–4.0)
MCH: 26.3 pg (ref 26.0–34.0)
MCHC: 31.1 g/dL (ref 30.0–36.0)
MCV: 84.4 fL (ref 80.0–100.0)
Monocytes Absolute: 1.5 10*3/uL — ABNORMAL HIGH (ref 0.1–1.0)
Monocytes Relative: 8 %
Neutro Abs: 16.1 10*3/uL — ABNORMAL HIGH (ref 1.7–7.7)
Neutrophils Relative %: 87 %
Platelets: 379 10*3/uL (ref 150–400)
RBC: 4.49 MIL/uL (ref 4.22–5.81)
RDW: 22.9 % — ABNORMAL HIGH (ref 11.5–15.5)
WBC: 18.6 10*3/uL — ABNORMAL HIGH (ref 4.0–10.5)
nRBC: 0 % (ref 0.0–0.2)

## 2019-12-10 LAB — COMPREHENSIVE METABOLIC PANEL
ALT: 75 U/L — ABNORMAL HIGH (ref 0–44)
AST: 63 U/L — ABNORMAL HIGH (ref 15–41)
Albumin: 2.6 g/dL — ABNORMAL LOW (ref 3.5–5.0)
Alkaline Phosphatase: 126 U/L (ref 38–126)
Anion gap: 14 (ref 5–15)
BUN: 47 mg/dL — ABNORMAL HIGH (ref 8–23)
CO2: 17 mmol/L — ABNORMAL LOW (ref 22–32)
Calcium: 8.8 mg/dL — ABNORMAL LOW (ref 8.9–10.3)
Chloride: 101 mmol/L (ref 98–111)
Creatinine, Ser: 1.44 mg/dL — ABNORMAL HIGH (ref 0.61–1.24)
GFR calc Af Amer: 51 mL/min — ABNORMAL LOW (ref >60)
GFR calc non Af Amer: 44 mL/min — ABNORMAL LOW (ref >60)
Glucose, Bld: 153 mg/dL — ABNORMAL HIGH (ref 70–99)
Potassium: 4.3 mmol/L (ref 3.5–5.1)
Sodium: 132 mmol/L — ABNORMAL LOW (ref 135–145)
Total Bilirubin: 1.1 mg/dL (ref 0.3–1.2)
Total Protein: 7 g/dL (ref 6.5–8.1)

## 2019-12-10 LAB — CBC
HCT: 36.6 % — ABNORMAL LOW (ref 39.0–52.0)
Hemoglobin: 11.1 g/dL — ABNORMAL LOW (ref 13.0–17.0)
MCH: 25.8 pg — ABNORMAL LOW (ref 26.0–34.0)
MCHC: 30.3 g/dL (ref 30.0–36.0)
MCV: 84.9 fL (ref 80.0–100.0)
Platelets: 343 10*3/uL (ref 150–400)
RBC: 4.31 MIL/uL (ref 4.22–5.81)
RDW: 22.9 % — ABNORMAL HIGH (ref 11.5–15.5)
WBC: 18.2 10*3/uL — ABNORMAL HIGH (ref 4.0–10.5)
nRBC: 0 % (ref 0.0–0.2)

## 2019-12-10 LAB — PROTIME-INR
INR: 1.2 (ref 0.8–1.2)
Prothrombin Time: 14.9 seconds (ref 11.4–15.2)

## 2019-12-10 LAB — APTT: aPTT: 29 seconds (ref 24–36)

## 2019-12-10 LAB — CREATININE, SERUM
Creatinine, Ser: 1.24 mg/dL (ref 0.61–1.24)
GFR calc Af Amer: >60 mL/min (ref >60)
GFR calc non Af Amer: 53 mL/min — ABNORMAL LOW (ref >60)

## 2019-12-10 LAB — POC SARS CORONAVIRUS 2 AG -  ED: SARS Coronavirus 2 Ag: NEGATIVE

## 2019-12-10 LAB — MAGNESIUM: Magnesium: 2.3 mg/dL (ref 1.7–2.4)

## 2019-12-10 LAB — LACTIC ACID, PLASMA: Lactic Acid, Venous: 1.4 mmol/L (ref 0.5–1.9)

## 2019-12-10 LAB — PHOSPHORUS: Phosphorus: 3.7 mg/dL (ref 2.5–4.6)

## 2019-12-10 MED ORDER — SODIUM CHLORIDE 0.9 % IV SOLN: INTRAVENOUS | Status: DC

## 2019-12-10 MED ORDER — ACETAMINOPHEN 650 MG RE SUPP: 650.0000 mg | Freq: Four times a day (QID) | RECTAL | Status: DC | PRN

## 2019-12-10 MED ORDER — ACETAMINOPHEN 325 MG PO TABS
650.0000 mg | ORAL_TABLET | Freq: Four times a day (QID) | ORAL | Status: DC | PRN
  Administered 2019-12-12 – 2019-12-16 (×3): 650 mg via ORAL
  Filled 2019-12-10 (×3): qty 2

## 2019-12-10 MED ORDER — ENOXAPARIN SODIUM 40 MG/0.4ML ~~LOC~~ SOLN
40.0000 mg | SUBCUTANEOUS | Status: DC
  Administered 2019-12-10 – 2019-12-17 (×7): 40 mg via SUBCUTANEOUS
  Filled 2019-12-10 (×6): qty 0.4

## 2019-12-10 MED ORDER — IOHEXOL 300 MG/ML  SOLN
80.0000 mL | Freq: Once | INTRAMUSCULAR | Status: AC | PRN
  Administered 2019-12-10: 80 mL via INTRAVENOUS

## 2019-12-10 MED ORDER — SODIUM CHLORIDE 0.9 % IV SOLN: 1.0000 g | Freq: Once | INTRAVENOUS | Status: DC

## 2019-12-10 MED ORDER — HYDRALAZINE HCL 20 MG/ML IJ SOLN: 10.0000 mg | Freq: Four times a day (QID) | INTRAMUSCULAR | Status: DC | PRN

## 2019-12-10 MED ORDER — ONDANSETRON HCL 4 MG/2ML IJ SOLN: 4.0000 mg | Freq: Four times a day (QID) | INTRAMUSCULAR | Status: DC | PRN

## 2019-12-10 MED ORDER — METRONIDAZOLE IN NACL 5-0.79 MG/ML-% IV SOLN
500.0000 mg | Freq: Once | INTRAVENOUS | Status: AC
  Administered 2019-12-10: 500 mg via INTRAVENOUS
  Filled 2019-12-10: qty 100

## 2019-12-10 MED ORDER — ONDANSETRON HCL 4 MG PO TABS: 4.0000 mg | ORAL_TABLET | Freq: Four times a day (QID) | ORAL | Status: DC | PRN

## 2019-12-10 MED ORDER — SODIUM CHLORIDE 0.9 % IV BOLUS
1000.0000 mL | Freq: Once | INTRAVENOUS | Status: AC
  Administered 2019-12-10: 1000 mL via INTRAVENOUS

## 2019-12-10 MED ORDER — DILTIAZEM HCL-DEXTROSE 125-5 MG/125ML-% IV SOLN (PREMIX)
5.0000 mg/h | INTRAVENOUS | Status: DC
  Administered 2019-12-10 – 2019-12-11 (×2): 5 mg/h via INTRAVENOUS
  Administered 2019-12-12 – 2019-12-14 (×5): 10 mg/h via INTRAVENOUS
  Administered 2019-12-15: 15 mg/h via INTRAVENOUS
  Administered 2019-12-15: 10 mg/h via INTRAVENOUS
  Administered 2019-12-15 – 2019-12-16 (×4): 15 mg/h via INTRAVENOUS
  Administered 2019-12-17: 10 mg/h via INTRAVENOUS
  Filled 2019-12-10 (×16): qty 125

## 2019-12-10 MED ORDER — SODIUM CHLORIDE 0.9 % IV SOLN
2.0000 g | Freq: Once | INTRAVENOUS | Status: AC
  Administered 2019-12-10: 2 g via INTRAVENOUS
  Filled 2019-12-10: qty 20

## 2019-12-10 NOTE — H&P (Signed)
History and Physical    [name] [id number] DOB: March 15, 1932 DOA: 12/10/2019  PCP: [name], MD  Patient coming from: Home  I have personally briefly reviewed patient's old medical records in [place]  Chief Complaint: Generalized weakness  HPI: Derrick [name] is a 86 y.o. male with medical history significant of rheumatoid arthritis on prednisone, Plaquenil, coronary artery disease, PAD, depression, hypertension, CKD stage III, right renal carcinoma status post right nephrectomy, cholecystitis status post cholecystectomy and tube placement by IR presents to emergency department due to generalized weakness, lethargic, loss of appetite and abdominal pain since 2 weeks  Patient is poor historian.  I called patient's son Mr. Notnamed Swoveland who mentioned that patient is very sluggish, has decreased appetite, lethargic since 2 weeks however it is getting worse since last couple of days.  Has decreased urinary output and nausea however no history of fever, chills, headache, blurry vision, trauma, seizures, syncope, chest pain, shortness of breath, leg swelling, recent COVID-19 exposure.  Patient had follow-up appointment with IR this afternoon however they missed the appointment due to patient's altered mental status.  Patient lives with his wife at home and uses walker for ambulation, no history of smoking, alcohol, street drug use.   ED Course: Upon arrival to ED: Patient tachycardic, tachypneic, blood pressure elevated, WBC: 18.6, lactic acid: WNL, CT abdomen/pelvis shows left hydro and ureteral nephrosis, patient received IV Rocephin and Flagyl in ED.  CT head without contrast negative for stroke.  Review of Systems: As per HPI otherwise negative.    Past Medical History:  Diagnosis Date  . Anemia   . Anxiety   . Arthritis, rheumatoid ([place])   . BPH (benign prostatic hyperplasia)   . CAD (coronary artery disease)   . CKD (chronic kidney disease)   . Depression   .  Diverticulosis   . Elevated PSA   . Gangrene ([place])    of toe right foot  . GERD (gastroesophageal reflux disease)   . Herpes simplex labialis   . High cholesterol   . History of colonic polyps   . Hypertension   . Insomnia   . Lacunar infarction ([place])   . Memory loss   . Obesity   . Panic attacks   . Prostate cancer ([place]) YRS AGO  . Renal cell cancer ([place])    s/p nephrectomy in 2000  . Seropositive rheumatoid arthritis ([place])   . Spermatocele   . Tinnitus   . Tremor of both hands     Past Surgical History:  Procedure Laterality Date  . AMPUTATION TOE Right 06/13/2017   Procedure: AMPUTATION TOE 1st and 2nd;  Surgeon: [name], DPM;  Location: [facility];  Service: Podiatry;  Laterality: Right;  . AMPUTATION TOE     RIGHT FOOT ALL TOES GONE  . BACK SURGERY  YRS AGO   LOWER  . COLONOSCOPY    . GRAFT APPLICATION Right 0000000   Procedure: EPIFIX GRAFT APPLICATION OF RIGHT FOOT;  Surgeon: [name], DPM;  Location: [facility];  Service: Podiatry;  Laterality: Right;  . I & D EXTREMITY Right 09/13/2017   Procedure: IRRIGATION AND DEBRIDEMENT AND BONE BIOPSY;  Surgeon: [name], DPM;  Location: [facility];  Service: Podiatry;  Laterality: Right;  . INCISION AND DRAINAGE OF WOUND Right 12/18/2017   Procedure: IRRIGATION AND DEBRIDEMENT WOUND ON RIGHT FOOT;  Surgeon: [name], DPM;  Location: [facility];  Service: Podiatry;  Laterality: Right;  . IR ANGIOGRAM  EXTREMITY RIGHT  05/30/2017  . IR ANGIOGRAM EXTREMITY RIGHT  08/08/2017  . IR ANGIOGRAM SELECTIVE EACH ADDITIONAL VESSEL  08/08/2017  . IR ANGIOGRAM SELECTIVE EACH ADDITIONAL VESSEL  08/08/2017  . IR CATHETER TUBE CHANGE  09/26/2019  . IR CATHETER TUBE CHANGE  11/23/2019  . IR PERC CHOLECYSTOSTOMY  09/18/2019  . IR RADIOLOGIST EVAL & MGMT  05/16/2017  . IR TIB-PERO ART PTA MOD SED  05/30/2017  . IR TIB-PERO ART PTA MOD SED  08/08/2017  . IR TIB-PERO ART UNI PTA EA ADD VESSEL MOD SED  05/30/2017  . IR TIB-PERO ART UNI PTA  EA ADD VESSEL MOD SED  08/08/2017  . IR US GUIDE VASC ACCESS RIGHT  05/30/2017  . IR US GUIDE VASC ACCESS RIGHT  08/08/2017  . LEFT HEART CATHETERIZATION WITH CORONARY ANGIOGRAM N/A 11/19/2011   Procedure: LEFT HEART CATHETERIZATION WITH CORONARY ANGIOGRAM;  Surgeon: [name], MD;  Location: [facility];  Service: Cardiovascular;  Laterality: N/A;  . LUMBAR LAMINECTOMY/DECOMPRESSION MICRODISCECTOMY N/A 11/26/2018   Procedure: Lumbar central decompression L2-3, L3-4;  Surgeon: [name], MD;  Location: [facility];  Service: Orthopedics;  Laterality: N/A;  156min  . NEPHRECTOMY  10/1998  . TRANSMETATARSAL AMPUTATION Right 08/14/2017   Procedure: TRANSMETATARSAL AMPUTATION;  Surgeon: [name], DPM;  Location: [facility];  Service: Podiatry;  Laterality: Right;     reports that he quit smoking about 41 years ago. His smoking use included cigarettes. He has a 29.00 pack-year smoking history. He has never used smokeless tobacco. He reports that he does not drink alcohol or use drugs.  Allergies  Allergen Reactions  . Effexor [Venlafaxine] Other (See Comments)    Sexual side affects  . Lisinopril Cough  . Paxil [Paroxetine] Other (See Comments)    Sexual side affects  . Viagra [Sildenafil] Other (See Comments)    Chest discomfort  . Wellbutrin [Bupropion] Itching    Family History  Problem Relation Age of Onset  . Heart attack Father 58  . Stroke Father   . Kidney failure Sister        Bright's disease   . Cancer Paternal Aunt        d. 71s; NOS cancer of leg  . Kidney disease Sister        d. 1; "Bright's disease" - possible kidney cancer?  . Cancer Sister        NOS cancer; d. 45s; "sick in the hospital"  . Heart attack Son 36  . Leukemia Son 28  . COPD Son   . Breast cancer Daughter 35    Prior to Admission medications   Medication Sig Start Date End Date Taking? Authorizing Provider  acetaminophen (TYLENOL) 325 MG tablet Take 2 tablets (650 mg total) by mouth every 6  (six) hours as needed for mild pain (or Fever >/= 101). 09/30/19  Yes Guilford Shi, MD  aspirin 81 MG chewable tablet Chew 81 mg by mouth daily.   Yes [provider]  atorvastatin (LIPITOR) 40 MG tablet Take 40 mg by mouth daily. 10/20/19  Yes [provider]  clopidogrel (PLAVIX) 75 MG tablet Take 75 mg by mouth daily.   Yes [provider]  diltiazem (CARDIZEM) 30 MG tablet Take 30 mg by mouth 2 (two) times daily. 10/28/19  Yes [provider]  diphenhydrAMINE HCl (ZZZQUIL) 50 MG/30ML LIQD Take 30 mLs by mouth at bedtime as needed (sleep).   Yes [provider]  docusate sodium (COLACE) 250 MG capsule  Take 250 mg by mouth daily.   Yes [provider]  feeding supplement, ENSURE ENLIVE, (ENSURE ENLIVE) LIQD Take 237 mLs by mouth 2 (two) times daily between meals. 09/30/19  Yes Guilford Shi, MD  finasteride (PROSCAR) 5 MG tablet Take 5 mg by mouth every evening.    Yes [provider]  hydroxychloroquine (PLAQUENIL) 200 MG tablet Take 200 mg by mouth daily.   Yes [provider]  Multiple Vitamin (MULTIVITAMIN WITH MINERALS) TABS tablet Take 1 tablet by mouth every evening.    Yes [provider]  nitroGLYCERIN (NITROSTAT) 0.3 MG SL tablet Place 0.3 mg under the tongue every 5 (five) minutes as needed for chest pain.   Yes [provider]  Omega-3 Fatty Acids (FISH OIL) 1000 MG CAPS Take 1,000 mg by mouth daily.   Yes [provider]  ondansetron (ZOFRAN) 4 MG tablet Take 1 tablet (4 mg total) by mouth every 6 (six) hours as needed for nausea. 09/30/19  Yes Guilford Shi, MD  pantoprazole (PROTONIX) 40 MG tablet Take 40 mg by mouth 2 (two) times daily. 08/03/19  Yes [provider]  polyethylene glycol (MIRALAX / GLYCOLAX) 17 g packet Take 17 g by mouth daily as needed for mild constipation.   Yes [provider]  predniSONE (DELTASONE) 5 MG tablet Take 5 mg by mouth daily  with breakfast.   Yes [provider]  sertraline (ZOLOFT) 25 MG tablet Take 25 mg by mouth daily.   Yes [provider]  Amino Acids-Protein Hydrolys (FEEDING SUPPLEMENT, PRO-STAT SUGAR FREE 64,) LIQD Take 30 mLs by mouth 2 (two) times daily. Patient not taking: Reported on 12/10/2019 09/30/19   Guilford Shi, MD    Physical Exam: Vitals:   12/10/19 1715 12/10/19 1730 12/10/19 1745 12/10/19 1815  BP: (!) 164/94 (!) 169/93 (!) 152/99 140/85  Pulse: (!) 123 (!) 132 (!) 137 (!) 122  Resp: (!) 24 (!) 26 (!) 25 (!) 29  Temp:      TempSrc:      SpO2: 99% 99% 99% 100%  Weight:      Height:        Constitutional: NAD, appears very dehydrated, cachectic, not oriented to time place or person. Eyes: PERRL, lids and conjunctivae normal ENMT: Mucous membranes are moist. Posterior pharynx clear of any exudate or lesions.Normal dentition.  Neck: normal, supple, no masses, no thyromegaly Respiratory: clear to auscultation bilaterally, no wheezing, no crackles. Normal respiratory effort. No accessory muscle use.  Cardiovascular: Regular rate and rhythm, no murmurs / rubs / gallops. No extremity edema. 2+ pedal pulses. No carotid bruits.  Abdomen: no tenderness, no masses palpated. No hepatosplenomegaly. Bowel sounds positive.  Tube is intact, bilious noted in the bag.  No discharge or blood seen. Musculoskeletal: no clubbing / cyanosis. No joint deformity upper and lower extremities. Good ROM, no contractures. Normal muscle tone.  Skin: no rashes, lesions, ulcers. No induration Neurologic: Unable to perform neurological exam due to patient altered mental status.   Labs on Admission: I have personally reviewed following labs and imaging studies  CBC: Recent Labs  Lab 12/10/19 1138  WBC 18.6*  NEUTROABS 16.1*  HGB 11.8*  HCT 37.9*  MCV 84.4  PLT [id number]   Basic Metabolic Panel: Recent Labs  Lab 12/10/19 1138  NA 132*  K 4.3  CL 101  CO2 17*  GLUCOSE 153*  BUN 47*    CREATININE 1.44*  CALCIUM 8.8*   GFR: Estimated Creatinine Clearance: 37.5 mL/min (A) (  by C-G formula based on SCr of 1.44 mg/dL (H)). Liver Function Tests: Recent Labs  Lab 12/10/19 1138  AST 63*  ALT 75*  ALKPHOS 126  BILITOT 1.1  PROT 7.0  ALBUMIN 2.6*   No results for input(s): LIPASE, AMYLASE in the last 168 hours. No results for input(s): AMMONIA in the last 168 hours. Coagulation Profile: Recent Labs  Lab 12/10/19 1138  INR 1.2   Cardiac Enzymes: No results for input(s): CKTOTAL, CKMB, CKMBINDEX, TROPONINI in the last 168 hours. BNP (last 3 results) No results for input(s): PROBNP in the last 8760 hours. HbA1C: No results for input(s): HGBA1C in the last 72 hours. CBG: No results for input(s): GLUCAP in the last 168 hours. Lipid Profile: No results for input(s): CHOL, HDL, LDLCALC, TRIG, CHOLHDL, LDLDIRECT in the last 72 hours. Thyroid Function Tests: No results for input(s): TSH, T4TOTAL, FREET4, T3FREE, THYROIDAB in the last 72 hours. Anemia Panel: No results for input(s): VITAMINB12, FOLATE, FERRITIN, TIBC, IRON, RETICCTPCT in the last 72 hours. Urine analysis:    Component Value Date/Time   COLORURINE AMBER (A) 12/10/2019 1628   APPEARANCEUR HAZY (A) 12/10/2019 1628   LABSPEC 1.028 12/10/2019 1628   PHURINE 5.0 12/10/2019 1628   GLUCOSEU NEGATIVE 12/10/2019 1628   HGBUR NEGATIVE 12/10/2019 1628   BILIRUBINUR NEGATIVE 12/10/2019 1628   KETONESUR NEGATIVE 12/10/2019 1628   PROTEINUR 100 (A) 12/10/2019 1628   UROBILINOGEN 1.0 11/27/2013 0947   NITRITE NEGATIVE 12/10/2019 1628   LEUKOCYTESUR NEGATIVE 12/10/2019 1628    Radiological Exams on Admission: DG Chest Port 1 View  Result Date: 12/10/2019 CLINICAL DATA:  Sepsis, weakness EXAM: PORTABLE CHEST 1 VIEW COMPARISON:  09/16/2019 FINDINGS: The heart size and mediastinal contours are within normal limits. Calcific aortic knob. No focal airspace consolidation, pleural effusion, or pneumothorax.  Degenerative changes of the bilateral shoulders. IMPRESSION: No active disease. Electronically Signed   By: Davina Poke D.O.   On: 12/10/2019 12:15    EKG: A. fib with RVR with prolonged QT interval, no ST elevation or depression.  Assessment/Plan Active Problems:   HTN (hypertension)   Anemia   Stage 3 chronic kidney disease   CAD (coronary artery disease)   Sepsis ([place])   Hydronephrosis of left kidney   Sepsis in setting of hydroureteronephrosis of left kidney: -Patient presented with generalized weakness, lethargy and decreased p.o. intake. -Reviewed CT abdomen/pelvis result.  UA is negative for nitrites or leukocytes. -Patient tachycardic, tachypneic, leukocytosis of 18.6, lactic acid: WNL. -Patient has history of right nephrectomy. -Patient received IV Rocephin and Flagyl in ED  -EDP consulted urology for further management-await recommendation -Continue Rocephin.  Blood culture and urine culture is pending.  COVID-19 is negative. -Continue IV fluids and monitor vitals closely.  Altered mental status: -Likely in the setting of sepsis.  COVID-19 is negative. -CT head without contrast is negative for stroke. -Neurochecks, PT/ST/OT has been consulted. -We will keep him n.p.o. until he passes the bedside swallow evaluation.  A. fib with RVR: -In the setting of sepsis -Heart rate in 130s, started patient on IV Cardizem drip.  On telemetry.  Monitor heart rate closely. -Reviewed echo from 09/19/2019 which showed preserved ejection fraction of 55 to 60%. -CHA2DS2-VASc score is 4.  He is not on anticoagulation at home.  History of cholecystitis status post cholecystectomy and tube placement: -LFTs: AST: 63, ALT: 75.  Total bilirubin: WNL, PT/INR: WNL. -We will monitor for now. -Consider IR consult tomorrow am.  CKD stage IIIb: Likely worsened kidney function  likely in the setting of infection -Continue IV fluids and monitor kidney function closely.  Rheumatoid  arthritis: -Patient takes Plaquenil and steroids. -Patient's blood pressure is elevated, no need of stress dose of steroids at this time.  Hypertension: Blood pressure is elevated -Hydralazine as needed for blood pressure more than 160/100 and monitor blood pressure closely.  GERD: Hold PPI for now  Depression: Hold Zoloft for now  CAD/PAD: Hold aspirin, Plavix, statin  Hyperlipidemia: Hold statin for now  Anemia of chronic disease: -H&H is stable, monitor for now.  DVT prophylaxis: Lovenox, TED/SCD Code Status: Full code-confirmed with patient's son  family Communication: None present at bedside.  Plan of care discussed with patient son over the phone in length and he verbalized understanding and agreed with it. Disposition Plan: To be determined Consults called: Urology by EDP admission status: Inpatient  Mckinley Jewel MD [facility] Pager [phone]  If 7PM-7AM, please contact night-coverage [email or web address] Password [facility]  12/10/2019, 6:31 PM

## 2019-12-10 NOTE — ED Triage Notes (Signed)
Pt arrived via [facility] from home for AMS and lethargic. Pt had gallbladder infection a month and a half ago and has been [place] at home. Pt has had weaknessx2 wks, getting worse the last couple days. Pt is A&Ox2 to self and place. Pt is in A-fib w/RVR on monitor. Pt tachycardic and bp elevated. Pt has tube on right side that has bile in it.

## 2019-12-10 NOTE — ED Provider Notes (Addendum)
[facility] Emergency Department Provider Note MRN:  [id number]  Arrival date & time: 12/10/19     Chief Complaint   Altered mental status History of Present Illness   Derrick [name] is a 86 y.o. year-old male with history of stroke, hypertension presenting to the ED with chief complaint of altered mental status.  Altered and tachycardic, lethargic, recently recovering from gallbladder surgery.  I was unable to obtain an accurate HPI, PMH, or ROS due to the patient's altered mental status.  Level 5 caveat.  Review of Systems  Positive for altered mental status, lethargy.  Patient's Health History    Past Medical History:  Diagnosis Date  . Anemia   . Anxiety   . Arthritis, rheumatoid ([place])   . BPH (benign prostatic hyperplasia)   . CAD (coronary artery disease)   . CKD (chronic kidney disease)   . Depression   . Diverticulosis   . Elevated PSA   . Gangrene ([place])    of toe right foot  . GERD (gastroesophageal reflux disease)   . Herpes simplex labialis   . High cholesterol   . History of colonic polyps   . Hypertension   . Insomnia   . Lacunar infarction ([place])   . Memory loss   . Obesity   . Panic attacks   . Prostate cancer ([place]) YRS AGO  . Renal cell cancer ([place])    s/p nephrectomy in 2000  . Seropositive rheumatoid arthritis ([place])   . Spermatocele   . Tinnitus   . Tremor of both hands     Past Surgical History:  Procedure Laterality Date  . AMPUTATION TOE Right 06/13/2017   Procedure: AMPUTATION TOE 1st and 2nd;  Surgeon: [name], DPM;  Location: [place];  Service: Podiatry;  Laterality: Right;  . AMPUTATION TOE     RIGHT FOOT ALL TOES GONE  . BACK SURGERY  YRS AGO   LOWER  . COLONOSCOPY    . GRAFT APPLICATION Right 0000000   Procedure: EPIFIX GRAFT APPLICATION OF RIGHT FOOT;  Surgeon: [name], DPM;  Location: [place];  Service: Podiatry;  Laterality: Right;  . I & D EXTREMITY Right 09/13/2017   Procedure: IRRIGATION  AND DEBRIDEMENT AND BONE BIOPSY;  Surgeon: [name], DPM;  Location: [place];  Service: Podiatry;  Laterality: Right;  . INCISION AND DRAINAGE OF WOUND Right 12/18/2017   Procedure: IRRIGATION AND DEBRIDEMENT WOUND ON RIGHT FOOT;  Surgeon: [name], DPM;  Location: [place];  Service: Podiatry;  Laterality: Right;  . IR ANGIOGRAM EXTREMITY RIGHT  05/30/2017  . IR ANGIOGRAM EXTREMITY RIGHT  08/08/2017  . IR ANGIOGRAM SELECTIVE EACH ADDITIONAL VESSEL  08/08/2017  . IR ANGIOGRAM SELECTIVE EACH ADDITIONAL VESSEL  08/08/2017  . IR CATHETER TUBE CHANGE  09/26/2019  . IR CATHETER TUBE CHANGE  11/23/2019  . IR PERC CHOLECYSTOSTOMY  09/18/2019  . IR RADIOLOGIST EVAL & MGMT  05/16/2017  . IR TIB-PERO ART PTA MOD SED  05/30/2017  . IR TIB-PERO ART PTA MOD SED  08/08/2017  . IR TIB-PERO ART UNI PTA EA ADD VESSEL MOD SED  05/30/2017  . IR TIB-PERO ART UNI PTA EA ADD VESSEL MOD SED  08/08/2017  . IR US GUIDE VASC ACCESS RIGHT  05/30/2017  . IR US GUIDE VASC ACCESS RIGHT  08/08/2017  . LEFT HEART CATHETERIZATION WITH CORONARY ANGIOGRAM N/A 11/19/2011   Procedure: LEFT HEART CATHETERIZATION WITH CORONARY ANGIOGRAM;  Surgeon: [name], MD;  Location: [place] CATH LAB;  Service: Cardiovascular;  Laterality: N/A;  . LUMBAR LAMINECTOMY/DECOMPRESSION MICRODISCECTOMY N/A 11/26/2018   Procedure: Lumbar central decompression L2-3, L3-4;  Surgeon: [name], MD;  Location: [facility];  Service: Orthopedics;  Laterality: N/A;  18min  . NEPHRECTOMY  10/1998  . TRANSMETATARSAL AMPUTATION Right 08/14/2017   Procedure: TRANSMETATARSAL AMPUTATION;  Surgeon: [name], DPM;  Location: [place];  Service: Podiatry;  Laterality: Right;    Family History  Problem Relation Age of Onset  . Heart attack Father 2  . Stroke Father   . Kidney failure Sister        Bright's disease   . Cancer Paternal Aunt        d. 17s; NOS cancer of leg  . Kidney disease Sister        d. 59; "Bright's disease" - possible kidney  cancer?  . Cancer Sister        NOS cancer; d. 82s; "sick in the hospital"  . Heart attack Son 82  . Leukemia Son 56  . COPD Son   . Breast cancer Daughter 26    Social History   Socioeconomic History  . Marital status: Married    Spouse name: Not on file  . Number of children: Not on file  . Years of education: Not on file  . Highest education level: Not on file  Occupational History  . Not on file  Tobacco Use  . Smoking status: Former Smoker    Packs/day: 1.00    Years: 29.00    Pack years: 29.00    Types: Cigarettes    Quit date: 10/08/1978    Years since quitting: 41.2  . Smokeless tobacco: Never Used  . Tobacco comment: tried smokeless tobacco once but made him sick  Substance and Sexual Activity  . Alcohol use: No  . Drug use: No  . Sexual activity: Not on file  Other Topics Concern  . Not on file  Social History Narrative   Retired   Married   4 children         Social Determinants of Radio broadcast assistant Strain:   . Difficulty of Paying Living Expenses: Not on file  Food Insecurity:   . Worried About Charity fundraiser in the Last Year: Not on file  . Ran Out of Food in the Last Year: Not on file  Transportation Needs:   . Lack of Transportation (Medical): Not on file  . Lack of Transportation (Non-Medical): Not on file  Physical Activity:   . Days of Exercise per Week: Not on file  . Minutes of Exercise per Session: Not on file  Stress:   . Feeling of Stress : Not on file  Social Connections:   . Frequency of Communication with Friends and Family: Not on file  . Frequency of Social Gatherings with Friends and Family: Not on file  . Attends Religious Services: Not on file  . Active Member of Clubs or Organizations: Not on file  . Attends Archivist Meetings: Not on file  . Marital Status: Not on file  Intimate Partner Violence:   . Fear of Current or Ex-Partner: Not on file  . Emotionally Abused: Not on file  . Physically  Abused: Not on file  . Sexually Abused: Not on file     Physical Exam   Vitals:   12/10/19 1400 12/10/19 1415  BP: (!) 150/99 (!) 167/92  Pulse: (!) 114 (!) 114  Resp: Marland Kitchen)  22 (!) 22  Temp:    SpO2: 99% 100%    CONSTITUTIONAL: Ill-appearing, NAD NEURO: Somnolent, moves all extremities, not following commands EYES:  eyes equal and reactive ENT/NECK:  no LAD, no JVD CARDIO: Tachycardic rate, well-perfused, normal S1 and S2 PULM:  CTAB no wheezing or rhonchi GI/GU:  normal bowel sounds, non-distended, non-tender MSK/SPINE:  No gross deformities, no edema SKIN:  no rash, atraumatic PSYCH:  Appropriate speech and behavior  *Additional and/or pertinent findings included in MDM below  Diagnostic and Interventional Summary    EKG Interpretation  Date/Time:  Thursday December 10 2019 12:55:24 EST Ventricular Rate:  132 PR Interval:    QRS Duration: 87 QT Interval:  359 QTC Calculation: 532 R Axis:   29 Text Interpretation: Atrial fibrillation Abnormal R-wave progression, early transition Repolarization abnormality, prob rate related Prolonged QT interval Confirmed by Gerlene Fee [phone]) on 12/10/2019 1:06:21 PM      Cardiac Monitoring Interpretation:  Labs Reviewed  COMPREHENSIVE METABOLIC PANEL - Abnormal; Notable for the following components:      Result Value   Sodium 132 (*)    CO2 17 (*)    Glucose, Bld 153 (*)    BUN 47 (*)    Creatinine, Ser 1.44 (*)    Calcium 8.8 (*)    Albumin 2.6 (*)    AST 63 (*)    ALT 75 (*)    GFR calc non Af Amer 44 (*)    GFR calc Af Amer 51 (*)    All other components within normal limits  CBC WITH DIFFERENTIAL/PLATELET - Abnormal; Notable for the following components:   WBC 18.6 (*)    Hemoglobin 11.8 (*)    HCT 37.9 (*)    RDW 22.9 (*)    Neutro Abs 16.1 (*)    Monocytes Absolute 1.5 (*)    Abs Immature Granulocytes 0.09 (*)    All other components within normal limits  CULTURE, BLOOD (ROUTINE X 2)  CULTURE, BLOOD (ROUTINE X  2)  URINE CULTURE  LACTIC ACID, PLASMA  APTT  PROTIME-INR  URINALYSIS, ROUTINE W REFLEX MICROSCOPIC  POC SARS CORONAVIRUS 2 AG -  ED    DG Chest Port 1 View  Final Result    CT ABDOMEN PELVIS W CONTRAST    (Results Pending)  CT Head Wo Contrast    (Results Pending)    Medications  sodium chloride 0.9 % bolus 1,000 mL (0 mLs Intravenous Stopped 12/10/19 1251)  sodium chloride 0.9 % bolus 1,000 mL (0 mLs Intravenous Stopped 12/10/19 1254)  cefTRIAXone (ROCEPHIN) 2 g in sodium chloride 0.9 % 100 mL IVPB (0 g Intravenous Stopped 12/10/19 1240)  metroNIDAZOLE (FLAGYL) IVPB 500 mg (0 mg Intravenous Stopped 12/10/19 1254)     Procedures  /  Critical Care .Critical Care Performed by: Maudie Flakes, MD Authorized by: Maudie Flakes, MD   Critical care provider statement:    Critical care time (minutes):  32   Critical care was necessary to treat or prevent imminent or life-threatening deterioration of the following conditions:  Sepsis   Critical care was time spent personally by me on the following activities:  Discussions with consultants, evaluation of patient's response to treatment, examination of patient, ordering and performing treatments and interventions, ordering and review of laboratory studies, ordering and review of radiographic studies, pulse oximetry, re-evaluation of patient's condition, obtaining history from patient or surrogate and review of old charts    ED Course and Medical Decision Making  I  have reviewed the triage vital signs, the nursing notes, and pertinent available records from the EMR.  Pertinent labs & imaging results that were available during my care of the patient were reviewed by me and considered in my medical decision making (see below for details).     Tachycardic, recent biliary surgery with biliary drain in place, tender abdomen, code sepsis initiated, awaiting CT imaging.  Providing empiric antibiotics and fluids.  3 PM update: Patient has continued  tachycardia, altered mental status, leukocytosis, mild AKI, still awaiting CT imaging.  PERC biliary drain was placed by Dr. Vernard Gambles of interventional radiology back in December.  Depending on CT imaging results may need to consult IR versus general surgery.  Without acute intervention needed, will admit to hospitalist service.  Signed out to oncoming provider at shift change.  Barth Kirks. Sedonia Small, [place] [email or web address]  Final Clinical Impressions(s) / ED Diagnoses     ICD-10-CM   1. Sepsis, due to unspecified organism, unspecified whether acute organ dysfunction present ([place])  A41.9   2. Atrial fibrillation with RVR ([facility])  I48.91   3. AKI (acute kidney injury) ([place])  N17.9     ED Discharge Orders    None       Discharge Instructions Discussed with and Provided to Patient:   Discharge Instructions   None       Maudie Flakes, MD 12/10/19 1501    Maudie Flakes, MD 12/18/19 [phone]

## 2019-12-11 ENCOUNTER — Other Ambulatory Visit: Payer: Self-pay

## 2019-12-11 ENCOUNTER — Inpatient Hospital Stay (HOSPITAL_COMMUNITY): Payer: Medicare Other

## 2019-12-11 ENCOUNTER — Encounter (HOSPITAL_COMMUNITY): Payer: Self-pay | Admitting: Internal Medicine

## 2019-12-11 DIAGNOSIS — N189 Chronic kidney disease, unspecified: Secondary | ICD-10-CM

## 2019-12-11 DIAGNOSIS — I2510 Atherosclerotic heart disease of native coronary artery without angina pectoris: Secondary | ICD-10-CM

## 2019-12-11 DIAGNOSIS — Z905 Acquired absence of kidney: Secondary | ICD-10-CM

## 2019-12-11 DIAGNOSIS — N400 Enlarged prostate without lower urinary tract symptoms: Secondary | ICD-10-CM

## 2019-12-11 DIAGNOSIS — Z7189 Other specified counseling: Secondary | ICD-10-CM

## 2019-12-11 DIAGNOSIS — Z87891 Personal history of nicotine dependence: Secondary | ICD-10-CM

## 2019-12-11 DIAGNOSIS — I340 Nonrheumatic mitral (valve) insufficiency: Secondary | ICD-10-CM

## 2019-12-11 DIAGNOSIS — I129 Hypertensive chronic kidney disease with stage 1 through stage 4 chronic kidney disease, or unspecified chronic kidney disease: Secondary | ICD-10-CM

## 2019-12-11 DIAGNOSIS — E785 Hyperlipidemia, unspecified: Secondary | ICD-10-CM

## 2019-12-11 DIAGNOSIS — K219 Gastro-esophageal reflux disease without esophagitis: Secondary | ICD-10-CM

## 2019-12-11 DIAGNOSIS — Z888 Allergy status to other drugs, medicaments and biological substances status: Secondary | ICD-10-CM

## 2019-12-11 DIAGNOSIS — Z85520 Personal history of malignant carcinoid tumor of kidney: Secondary | ICD-10-CM

## 2019-12-11 DIAGNOSIS — B952 Enterococcus as the cause of diseases classified elsewhere: Secondary | ICD-10-CM

## 2019-12-11 DIAGNOSIS — M069 Rheumatoid arthritis, unspecified: Secondary | ICD-10-CM

## 2019-12-11 DIAGNOSIS — R011 Cardiac murmur, unspecified: Secondary | ICD-10-CM

## 2019-12-11 DIAGNOSIS — Z978 Presence of other specified devices: Secondary | ICD-10-CM

## 2019-12-11 DIAGNOSIS — Z89421 Acquired absence of other right toe(s): Secondary | ICD-10-CM

## 2019-12-11 DIAGNOSIS — K819 Cholecystitis, unspecified: Secondary | ICD-10-CM

## 2019-12-11 DIAGNOSIS — Z515 Encounter for palliative care: Secondary | ICD-10-CM

## 2019-12-11 DIAGNOSIS — Z8546 Personal history of malignant neoplasm of prostate: Secondary | ICD-10-CM

## 2019-12-11 DIAGNOSIS — I4891 Unspecified atrial fibrillation: Secondary | ICD-10-CM

## 2019-12-11 DIAGNOSIS — R7881 Bacteremia: Secondary | ICD-10-CM

## 2019-12-11 DIAGNOSIS — N136 Pyonephrosis: Secondary | ICD-10-CM

## 2019-12-11 LAB — BLOOD CULTURE ID PANEL (REFLEXED)

## 2019-12-11 LAB — COMPREHENSIVE METABOLIC PANEL
ALT: 51 U/L — ABNORMAL HIGH (ref 0–44)
AST: 41 U/L (ref 15–41)
Albumin: 2 g/dL — ABNORMAL LOW (ref 3.5–5.0)
Alkaline Phosphatase: 140 U/L — ABNORMAL HIGH (ref 38–126)
Anion gap: 12 (ref 5–15)
BUN: 34 mg/dL — ABNORMAL HIGH (ref 8–23)
CO2: 17 mmol/L — ABNORMAL LOW (ref 22–32)
Calcium: 8.3 mg/dL — ABNORMAL LOW (ref 8.9–10.3)
Chloride: 109 mmol/L (ref 98–111)
Creatinine, Ser: 1.02 mg/dL (ref 0.61–1.24)
GFR calc Af Amer: >60 mL/min (ref >60)
GFR calc non Af Amer: >60 mL/min (ref >60)
Glucose, Bld: 125 mg/dL — ABNORMAL HIGH (ref 70–99)
Potassium: 4.1 mmol/L (ref 3.5–5.1)
Sodium: 138 mmol/L (ref 135–145)
Total Bilirubin: 1 mg/dL (ref 0.3–1.2)
Total Protein: 5.7 g/dL — ABNORMAL LOW (ref 6.5–8.1)

## 2019-12-11 LAB — CBC
HCT: 33.9 % — ABNORMAL LOW (ref 39.0–52.0)
Hemoglobin: 10.4 g/dL — ABNORMAL LOW (ref 13.0–17.0)
MCH: 26 pg (ref 26.0–34.0)
MCHC: 30.7 g/dL (ref 30.0–36.0)
MCV: 84.8 fL (ref 80.0–100.0)
Platelets: 331 10*3/uL (ref 150–400)
RBC: 4 MIL/uL — ABNORMAL LOW (ref 4.22–5.81)
RDW: 22.9 % — ABNORMAL HIGH (ref 11.5–15.5)
WBC: 15.9 10*3/uL — ABNORMAL HIGH (ref 4.0–10.5)
nRBC: 0 % (ref 0.0–0.2)

## 2019-12-11 LAB — ECHOCARDIOGRAM COMPLETE
Height: 69 in
Weight: 2588.8 oz

## 2019-12-11 LAB — SARS CORONAVIRUS 2 (TAT 6-24 HRS): SARS Coronavirus 2: NEGATIVE

## 2019-12-11 LAB — GLUCOSE, CAPILLARY: Glucose-Capillary: 105 mg/dL — ABNORMAL HIGH (ref 70–99)

## 2019-12-11 LAB — PSA: Prostatic Specific Antigen: 8.67 ng/mL — ABNORMAL HIGH (ref 0.00–4.00)

## 2019-12-11 MED ORDER — CHLORHEXIDINE GLUCONATE CLOTH 2 % EX PADS
6.0000 | MEDICATED_PAD | Freq: Every day | CUTANEOUS | Status: DC
  Administered 2019-12-11 – 2019-12-19 (×9): 6 via TOPICAL

## 2019-12-11 MED ORDER — SODIUM CHLORIDE 0.9 % IV SOLN
2.0000 g | INTRAVENOUS | Status: AC
  Administered 2019-12-11: 2 g via INTRAVENOUS
  Filled 2019-12-11: qty 2

## 2019-12-11 MED ORDER — DICLOFENAC SODIUM 1 % EX GEL
2.0000 g | Freq: Two times a day (BID) | CUTANEOUS | Status: DC | PRN
  Administered 2019-12-11 – 2019-12-16 (×4): 2 g via TOPICAL
  Filled 2019-12-11: qty 100

## 2019-12-11 MED ORDER — SODIUM CHLORIDE 0.9 % IV SOLN
2.0000 g | INTRAVENOUS | Status: DC
  Administered 2019-12-11 – 2019-12-19 (×49): 2 g via INTRAVENOUS
  Filled 2019-12-11 (×3): qty 2
  Filled 2019-12-11: qty 2000
  Filled 2019-12-11: qty 2
  Filled 2019-12-11: qty 2000
  Filled 2019-12-11 (×2): qty 2
  Filled 2019-12-11 (×2): qty 2000
  Filled 2019-12-11: qty 2
  Filled 2019-12-11: qty 2000
  Filled 2019-12-11: qty 2
  Filled 2019-12-11 (×2): qty 2000
  Filled 2019-12-11 (×3): qty 2
  Filled 2019-12-11: qty 2000
  Filled 2019-12-11 (×5): qty 2
  Filled 2019-12-11 (×2): qty 2000
  Filled 2019-12-11 (×2): qty 2
  Filled 2019-12-11: qty 2000
  Filled 2019-12-11 (×2): qty 2
  Filled 2019-12-11: qty 2000
  Filled 2019-12-11: qty 2
  Filled 2019-12-11 (×3): qty 2000
  Filled 2019-12-11 (×5): qty 2
  Filled 2019-12-11: qty 2000
  Filled 2019-12-11 (×2): qty 2
  Filled 2019-12-11: qty 2000
  Filled 2019-12-11 (×3): qty 2
  Filled 2019-12-11: qty 2000
  Filled 2019-12-11: qty 2
  Filled 2019-12-11: qty 2000
  Filled 2019-12-11 (×2): qty 2

## 2019-12-11 NOTE — Evaluation (Signed)
Clinical/Bedside Swallow Evaluation Patient Details  Name: Derrick [name] MRN: [id number] Date of Birth: 05/12/1932  Today's Date: 12/11/2019 Time: SLP Start Time (ACUTE ONLY): [id number] SLP Stop Time (ACUTE ONLY): [id number] SLP Time Calculation (min) (ACUTE ONLY): 18 min  Past Medical History:  Past Medical History:  Diagnosis Date  . Anemia   . Anxiety   . Arthritis, rheumatoid ([place])   . BPH (benign prostatic hyperplasia)   . CAD (coronary artery disease)   . CKD (chronic kidney disease)   . Depression   . Diverticulosis   . Elevated PSA   . Gangrene ([place])    of toe right foot  . GERD (gastroesophageal reflux disease)   . Herpes simplex labialis   . High cholesterol   . History of colonic polyps   . Hypertension   . Insomnia   . Lacunar infarction ([place])   . Memory loss   . Obesity   . Panic attacks   . Prostate cancer ([place]) YRS AGO  . Renal cell cancer ([place])    s/p nephrectomy in 2000  . Seropositive rheumatoid arthritis ([place])   . Spermatocele   . Tinnitus   . Tremor of both hands    Past Surgical History:  Past Surgical History:  Procedure Laterality Date  . AMPUTATION TOE Right 06/13/2017   Procedure: AMPUTATION TOE 1st and 2nd;  Surgeon: [name], DPM;  Location: [place];  Service: Podiatry;  Laterality: Right;  . AMPUTATION TOE     RIGHT FOOT ALL TOES GONE  . BACK SURGERY  YRS AGO   LOWER  . COLONOSCOPY    . GRAFT APPLICATION Right 0000000   Procedure: EPIFIX GRAFT APPLICATION OF RIGHT FOOT;  Surgeon: [name], DPM;  Location: [place];  Service: Podiatry;  Laterality: Right;  . I & D EXTREMITY Right 09/13/2017   Procedure: IRRIGATION AND DEBRIDEMENT AND BONE BIOPSY;  Surgeon: [name], DPM;  Location: [place];  Service: Podiatry;  Laterality: Right;  . INCISION AND DRAINAGE OF WOUND Right 12/18/2017   Procedure: IRRIGATION AND DEBRIDEMENT WOUND ON RIGHT FOOT;  Surgeon: [name], DPM;  Location: [place];  Service: Podiatry;  Laterality: Right;  .  IR ANGIOGRAM EXTREMITY RIGHT  05/30/2017  . IR ANGIOGRAM EXTREMITY RIGHT  08/08/2017  . IR ANGIOGRAM SELECTIVE EACH ADDITIONAL VESSEL  08/08/2017  . IR ANGIOGRAM SELECTIVE EACH ADDITIONAL VESSEL  08/08/2017  . IR CATHETER TUBE CHANGE  09/26/2019  . IR CATHETER TUBE CHANGE  11/23/2019  . IR PERC CHOLECYSTOSTOMY  09/18/2019  . IR RADIOLOGIST EVAL & MGMT  05/16/2017  . IR TIB-PERO ART PTA MOD SED  05/30/2017  . IR TIB-PERO ART PTA MOD SED  08/08/2017  . IR TIB-PERO ART UNI PTA EA ADD VESSEL MOD SED  05/30/2017  . IR TIB-PERO ART UNI PTA EA ADD VESSEL MOD SED  08/08/2017  . IR US GUIDE VASC ACCESS RIGHT  05/30/2017  . IR US GUIDE VASC ACCESS RIGHT  08/08/2017  . LEFT HEART CATHETERIZATION WITH CORONARY ANGIOGRAM N/A 11/19/2011   Procedure: LEFT HEART CATHETERIZATION WITH CORONARY ANGIOGRAM;  Surgeon: [name], MD;  [facility];  Service: Cardiovascular;  Laterality: N/A;  . LUMBAR LAMINECTOMY/DECOMPRESSION MICRODISCECTOMY N/A 11/26/2018   Procedure: Lumbar central decompression L2-3, L3-4;  Surgeon: [name], MD;  [facility];  Service: Orthopedics;  Laterality: N/A;  132min  . NEPHRECTOMY  10/1998  . TRANSMETATARSAL AMPUTATION Right 08/14/2017   Procedure: TRANSMETATARSAL AMPUTATION;  Surgeon: [name],  Christian Mate, DPM;  Location: [place];  Service: Podiatry;  Laterality: Right;   HPI:  Pt is an 86 y.o. male with medical history significant of rheumatoid arthritis on prednisone, Plaquenil, coronary artery disease, PAD, depression, hypertension, CKD stage III, right renal carcinoma status post right nephrectomy, cholecystitis status post cholecystectomy and tube placement by IR who presented to the ED with generalized weakness, lethargic, loss of appetite and abdominal pain since 2 weeks. CT of the head negative for acute changes. Extensive bilateral corona radiata white matter disease. Chest x-ray 3/4: no acute disease.   Assessment / Plan / Recommendation Clinical Impression  Pt was  seen for bedside swallow evaluation. His reliability as a historian is questioned due to his demonstration of confusion and the inconsistently of his reports. However, he inconsistently reported dysphagia characterized by difficulty "getting it down". Oral mechanism exam was limited due to pt's difficulty following commands; dry oral mucosa was noted and he presented with full dentures. He exhibited a right head tilt and reported that it was painful for him to have his head in a neutral position. He tolerated puree solids and thin liquids without signs or symptoms of aspiration. He required cues to masticate and with cueing only tapped solids with his incisors. Cues were also needed for him to accept thin liquids via straw and to suck instead of biting or blowing on the straw. Pt reported odynophagia with all solids and liquids which he rated 8/10 but continued to request more p.o. intake. SLP will follow to assess diet tolerance, persistence of odynophagia, and his ability to tolerate more advanced solids.  SLP Visit Diagnosis: Dysphagia, oral phase (R13.11)    Aspiration Risk  Mild aspiration risk    Diet Recommendation Dysphagia 1 (Puree);Thin liquid   Liquid Administration via: Cup;Straw Medication Administration: Crushed with puree Compensations: Minimize environmental distractions;Slow rate;Small sips/bites Postural Changes: Seated upright at 90 degrees    Other  Recommendations Oral Care Recommendations: Oral care BID   Follow up Recommendations Other (comment)(TBD)      Frequency and Duration min 2x/week  2 weeks       Prognosis Prognosis for Safe Diet Advancement: Fair Barriers to Reach Goals: Severity of deficits      Swallow Study   General Date of Onset: 12/10/19 HPI: Pt is an 86 y.o. male with medical history significant of rheumatoid arthritis on prednisone, Plaquenil, coronary artery disease, PAD, depression, hypertension, CKD stage III, right renal carcinoma status post  right nephrectomy, cholecystitis status post cholecystectomy and tube placement by IR who presented to the ED with generalized weakness, lethargic, loss of appetite and abdominal pain since 2 weeks. CT of the head negative for acute changes. Extensive bilateral corona radiata white matter disease. Chest x-ray 3/4: no acute disease. Type of Study: Bedside Swallow Evaluation Previous Swallow Assessment: None Diet Prior to this Study: NPO Temperature Spikes Noted: No Respiratory Status: Nasal cannula History of Recent Intubation: No Behavior/Cognition: Alert;Cooperative;Pleasant mood Oral Cavity Assessment: Within Functional Limits Oral Care Completed by SLP: No Oral Cavity - Dentition: Dentures, top;Dentures, bottom Vision: Functional for self-feeding Self-Feeding Abilities: Total assist Patient Positioning: Upright in bed;Postural control interferes with function(Pt demonstrated a right head tilt throughout the evaluation) Baseline Vocal Quality: Breathy;Low vocal intensity Volitional Cough: Cognitively unable to elicit Volitional Swallow: Unable to elicit    Oral/Motor/Sensory Function Overall Oral Motor/Sensory Function: Other (comment)(Pt unable to follow the necessary commands)   Ice Chips Ice chips: Within functional limits Presentation: Spoon   Thin Liquid Thin  Liquid: Within functional limits Presentation: Cup;Spoon;Straw    Nectar Thick Nectar Thick Liquid: Not tested   Honey Thick Honey Thick Liquid: Not tested   Puree Puree: Within functional limits Presentation: Spoon   Solid     Solid: Impaired Oral Phase Impairments: Poor awareness of bolus;Impaired mastication Oral Phase Functional Implications: Impaired mastication     Malaka Ruffner I. Hardin Negus, [place], [place] Office number [phone] Pager [phone]  Horton Marshall 12/11/2019,9:37 AM

## 2019-12-11 NOTE — Consult Note (Signed)
Urology Consult  Referring physician: Dr. Isaac Bliss Reason for referral: Urinary retention, left hydronephrosis  Chief Complaint: urinary retention  History of Present Illness: Mr Costanzo is a 86yo with a history of prostate cancer on active surveillance and BPH with LUTS, incomplete emptying who presented to the ER with worsening fatigue, malaise. Sepsis protocol initiated. He underwent CT while in the ER which showed a distended bladder with mild left hydronephrosis. He has a hx of right nephrectomy. Creatinine 1.02. Foley catheter was placed and drained over 1L. Prior to hospitalization he was having progressive LUTS including urinary frequency, weak stream and nocturia. He was previously seen by Dr. Jeffie Pollock in 2019 for prostate cancer and BPH and the patient was supposed to continue 6 month followup for his prostate cancer. PSA was 8.67  Past Medical History:  Diagnosis Date  . Anemia   . Anxiety   . Arthritis, rheumatoid ([place])   . BPH (benign prostatic hyperplasia)   . CAD (coronary artery disease)   . CKD (chronic kidney disease)   . Depression   . Diverticulosis   . Elevated PSA   . Gangrene ([place])    of toe right foot  . GERD (gastroesophageal reflux disease)   . Herpes simplex labialis   . High cholesterol   . History of colonic polyps   . Hypertension   . Insomnia   . Lacunar infarction ([place])   . Memory loss   . Obesity   . Panic attacks   . Prostate cancer ([place]) YRS AGO  . Renal cell cancer ([place])    s/p nephrectomy in 2000  . Seropositive rheumatoid arthritis ([place])   . Spermatocele   . Tinnitus   . Tremor of both hands    Past Surgical History:  Procedure Laterality Date  . AMPUTATION TOE Right 06/13/2017   Procedure: AMPUTATION TOE 1st and 2nd;  Surgeon: [name], DPM;  Location: [facility];  Service: Podiatry;  Laterality: Right;  . AMPUTATION TOE     RIGHT FOOT ALL TOES GONE  . BACK SURGERY  YRS AGO   LOWER  . COLONOSCOPY    . GRAFT APPLICATION Right  2/35/3614   Procedure: EPIFIX GRAFT APPLICATION OF RIGHT FOOT;  Surgeon: [name], DPM;  Location: [facility];  Service: Podiatry;  Laterality: Right;  . I & D EXTREMITY Right 09/13/2017   Procedure: IRRIGATION AND DEBRIDEMENT AND BONE BIOPSY;  Surgeon: [name], DPM;  Location: [facility];  Service: Podiatry;  Laterality: Right;  . INCISION AND DRAINAGE OF WOUND Right 12/18/2017   Procedure: IRRIGATION AND DEBRIDEMENT WOUND ON RIGHT FOOT;  Surgeon: [name], DPM;  Location: [facility];  Service: Podiatry;  Laterality: Right;  . IR ANGIOGRAM EXTREMITY RIGHT  05/30/2017  . IR ANGIOGRAM EXTREMITY RIGHT  08/08/2017  . IR ANGIOGRAM SELECTIVE EACH ADDITIONAL VESSEL  08/08/2017  . IR ANGIOGRAM SELECTIVE EACH ADDITIONAL VESSEL  08/08/2017  . IR CATHETER TUBE CHANGE  09/26/2019  . IR CATHETER TUBE CHANGE  11/23/2019  . IR PERC CHOLECYSTOSTOMY  09/18/2019  . IR RADIOLOGIST EVAL & MGMT  05/16/2017  . IR TIB-PERO ART PTA MOD SED  05/30/2017  . IR TIB-PERO ART PTA MOD SED  08/08/2017  . IR TIB-PERO ART UNI PTA EA ADD VESSEL MOD SED  05/30/2017  . IR TIB-PERO ART UNI PTA EA ADD VESSEL MOD SED  08/08/2017  . IR US GUIDE VASC ACCESS RIGHT  05/30/2017  . IR US GUIDE VASC ACCESS RIGHT  08/08/2017  .  LEFT HEART CATHETERIZATION WITH CORONARY ANGIOGRAM N/A 11/19/2011   Procedure: LEFT HEART CATHETERIZATION WITH CORONARY ANGIOGRAM;  Surgeon: [name], MD;  Location: [facility];  Service: Cardiovascular;  Laterality: N/A;  . LUMBAR LAMINECTOMY/DECOMPRESSION MICRODISCECTOMY N/A 11/26/2018   Procedure: Lumbar central decompression L2-3, L3-4;  Surgeon: [name], MD;  Location: [facility];  Service: Orthopedics;  Laterality: N/A;  140mn  . NEPHRECTOMY  10/1998  . TRANSMETATARSAL AMPUTATION Right 08/14/2017   Procedure: TRANSMETATARSAL AMPUTATION;  Surgeon: PEvelina Bucy DPM;  Location: [facility]  Service: Podiatry;  Laterality: Right;    Medications: I have reviewed the patient's current  medications. Allergies:  Allergies  Allergen Reactions  . Effexor [Venlafaxine] Other (See Comments)    Sexual side affects  . Lisinopril Cough  . Paxil [Paroxetine] Other (See Comments)    Sexual side affects  . Viagra [Sildenafil] Other (See Comments)    Chest discomfort  . Wellbutrin [Bupropion] Itching    Family History  Problem Relation Age of Onset  . Heart attack Father 739 . Stroke Father   . Kidney failure Sister        Bright's disease   . Cancer Paternal Aunt        d. 6105s NOS cancer of leg  . Kidney disease Sister        d. 263 "Bright's disease" - possible kidney cancer?  . Cancer Sister        NOS cancer; d. 772s "sick in the hospital"  . Heart attack Son 530 . Leukemia Son 248 . COPD Son   . Breast cancer Daughter 459  Social History:  reports that he quit smoking about 41 years ago. His smoking use included cigarettes. He has a 29.00 pack-year smoking history. He has never used smokeless tobacco. He reports that he does not drink alcohol or use drugs.  Review of Systems  Genitourinary: Positive for difficulty urinating, frequency and urgency.  All other systems reviewed and are negative.   Physical Exam:  Vital signs in last 24 hours: Temp:  [97.4 F (36.3 C)-99.1 F (37.3 C)] 97.4 F (36.3 C) (03/05 1206) Pulse Rate:  [97-137] 97 (03/05 1206) Resp:  [16-30] 20 (03/05 1206) BP: (118-169)/(73-99) 131/81 (03/05 1206) SpO2:  [98 %-100 %] 100 % (03/05 1206) Weight:  [73.4 kg] 73.4 kg (03/05 0503) Physical Exam  Constitutional: He is oriented to person, place, and time. He appears well-developed and well-nourished.  HENT:  Head: Normocephalic and atraumatic.  Eyes: Pupils are equal, round, and reactive to light. EOM are normal.  Neck: No thyromegaly present.  Cardiovascular: Normal rate and regular rhythm.  Respiratory: Effort normal. No respiratory distress.  GI: Soft. He exhibits no distension.  Musculoskeletal:        General: No edema. Normal  range of motion.     Cervical back: Normal range of motion.  Neurological: He is alert and oriented to person, place, and time.  Skin: Skin is warm and dry.  Psychiatric: He has a normal mood and affect. His behavior is normal. Judgment and thought content normal.    Laboratory Data:  Results for orders placed or performed during the hospital encounter of 12/10/19 (from the past 72 hour(s))  Blood Culture (routine x 2)     Status: Abnormal (Preliminary result)   Collection Time: 12/10/19 11:30 AM   Specimen: BLOOD  Result Value Ref Range   Specimen Description BLOOD RIGHT ANTECUBITAL    Special Requests  BOTTLES DRAWN AEROBIC AND ANAEROBIC Blood Culture adequate volume   Culture  Setup Time      IN BOTH AEROBIC AND ANAEROBIC BOTTLES CRITICAL RESULT CALLED TO, READ BACK BY AND VERIFIED WITH: K AMEND PHARMD 12/11/19 0125 JDW GRAM POSITIVE COCCI    Culture (A)     ENTEROCOCCUS FAECALIS CULTURE REINCUBATED FOR BETTER GROWTH Performed at [facility], [place] [address], [place], [place] [address]    Report Status PENDING   Blood Culture ID Panel (Reflexed)     Status: Abnormal   Collection Time: 12/10/19 11:30 AM  Result Value Ref Range   Enterococcus species DETECTED (A) NOT DETECTED    Comment: CRITICAL RESULT CALLED TO, READ BACK BY AND VERIFIED WITH: K AMEND PHARMD 12/11/19 0125 JDW    Vancomycin resistance NOT DETECTED NOT DETECTED   Listeria monocytogenes NOT DETECTED NOT DETECTED   Staphylococcus species NOT DETECTED NOT DETECTED   Staphylococcus aureus (BCID) NOT DETECTED NOT DETECTED   Streptococcus species NOT DETECTED NOT DETECTED   Streptococcus agalactiae NOT DETECTED NOT DETECTED   Streptococcus pneumoniae NOT DETECTED NOT DETECTED   Streptococcus pyogenes NOT DETECTED NOT DETECTED   Acinetobacter baumannii NOT DETECTED NOT DETECTED   Enterobacteriaceae species NOT DETECTED NOT DETECTED   Enterobacter cloacae complex NOT DETECTED NOT DETECTED   Escherichia  coli NOT DETECTED NOT DETECTED   Klebsiella oxytoca NOT DETECTED NOT DETECTED   Klebsiella pneumoniae NOT DETECTED NOT DETECTED   Proteus species NOT DETECTED NOT DETECTED   Serratia marcescens NOT DETECTED NOT DETECTED   Haemophilus influenzae NOT DETECTED NOT DETECTED   Neisseria meningitidis NOT DETECTED NOT DETECTED   Pseudomonas aeruginosa NOT DETECTED NOT DETECTED   Candida albicans NOT DETECTED NOT DETECTED   Candida glabrata NOT DETECTED NOT DETECTED   Candida krusei NOT DETECTED NOT DETECTED   Candida parapsilosis NOT DETECTED NOT DETECTED   Candida tropicalis NOT DETECTED NOT DETECTED    Comment: Performed at [facility] [address], [place], [place] [address]  Blood Culture (routine x 2)     Status: None (Preliminary result)   Collection Time: 12/10/19 11:37 AM   Specimen: BLOOD LEFT HAND  Result Value Ref Range   Specimen Description BLOOD LEFT HAND    Special Requests      BOTTLES DRAWN AEROBIC AND ANAEROBIC Blood Culture adequate volume   Culture  Setup Time      IN BOTH AEROBIC AND ANAEROBIC BOTTLES GRAM POSITIVE COCCI IN CHAINS CRITICAL VALUE NOTED.  VALUE IS CONSISTENT WITH PREVIOUSLY REPORTED AND CALLED VALUE.    Culture      GRAM POSITIVE COCCI CULTURE REINCUBATED FOR BETTER GROWTH Performed at [facility], [place] [address], [place], [place] [address]    Report Status PENDING   Lactic acid, plasma     Status: None   Collection Time: 12/10/19 11:38 AM  Result Value Ref Range   Lactic Acid, Venous 1.4 0.5 - 1.9 mmol/L    Comment: Performed at [facility] [address], [place], [place] [address]  Comprehensive metabolic panel     Status: Abnormal   Collection Time: 12/10/19 11:38 AM  Result Value Ref Range   Sodium 132 (L) 135 - 145 mmol/L   Potassium 4.3 3.5 - 5.1 mmol/L   Chloride 101 98 - 111 mmol/L   CO2 17 (L) 22 - 32 mmol/L   Glucose, Bld 153 (H) 70 - 99 mg/dL    Comment: Glucose reference range applies only  to samples taken  after fasting for at least 8 hours.   BUN 47 (H) 8 - 23 mg/dL   Creatinine, Ser 1.44 (H) 0.61 - 1.24 mg/dL   Calcium 8.8 (L) 8.9 - 10.3 mg/dL   Total Protein 7.0 6.5 - 8.1 g/dL   Albumin 2.6 (L) 3.5 - 5.0 g/dL   AST 63 (H) 15 - 41 U/L   ALT 75 (H) 0 - 44 U/L   Alkaline Phosphatase 126 38 - 126 U/L   Total Bilirubin 1.1 0.3 - 1.2 mg/dL   GFR calc non Af Amer 44 (L) >60 mL/min   GFR calc Af Amer 51 (L) >60 mL/min   Anion gap 14 5 - 15    Comment: Performed at [facility] [address] [place], [place] [address]  CBC WITH DIFFERENTIAL     Status: Abnormal   Collection Time: 12/10/19 11:38 AM  Result Value Ref Range   WBC 18.6 (H) 4.0 - 10.5 K/uL   RBC 4.49 4.22 - 5.81 MIL/uL   Hemoglobin 11.8 (L) 13.0 - 17.0 g/dL   HCT 37.9 (L) 39.0 - 52.0 %   MCV 84.4 80.0 - 100.0 fL   MCH 26.3 26.0 - 34.0 pg   MCHC 31.1 30.0 - 36.0 g/dL   RDW 22.9 (H) 11.5 - 15.5 %   Platelets 379 150 - 400 K/uL   nRBC 0.0 0.0 - 0.2 %   Neutrophils Relative % 87 %   Neutro Abs 16.1 (H) 1.7 - 7.7 K/uL   Lymphocytes Relative 4 %   Lymphs Abs 0.8 0.7 - 4.0 K/uL   Monocytes Relative 8 %   Monocytes Absolute 1.5 (H) 0.1 - 1.0 K/uL   Eosinophils Relative 0 %   Eosinophils Absolute 0.0 0.0 - 0.5 K/uL   Basophils Relative 0 %   Basophils Absolute 0.0 0.0 - 0.1 K/uL   WBC Morphology See Note     Comment: Toxic Granulation   Immature Granulocytes 1 %   Abs Immature Granulocytes 0.09 (H) 0.00 - 0.07 K/uL   Ovalocytes PRESENT     Comment: Performed at [facility], [address] [place], [place] [address]  APTT     Status: None   Collection Time: 12/10/19 11:38 AM  Result Value Ref Range   aPTT 29 24 - 36 seconds    Comment: Performed at [facility] [address] [place], [place] [address]  Protime-INR     Status: None   Collection Time: 12/10/19 11:38 AM  Result Value Ref Range   Prothrombin Time 14.9 11.4 - 15.2 seconds   INR 1.2 0.8 - 1.2    Comment: (NOTE) INR goal varies based  on device and disease states. Performed at [facility], [place] [address], [place], [place] [address]   POC SARS Coronavirus 2 Ag-ED - Nasal Swab (BD Veritor Kit)     Status: None   Collection Time: 12/10/19 12:32 PM  Result Value Ref Range   SARS Coronavirus 2 Ag NEGATIVE NEGATIVE    Comment: (NOTE) SARS-CoV-2 antigen NOT DETECTED.  Negative results are presumptive.  Negative results do not preclude SARS-CoV-2 infection and should not be used as the sole basis for treatment or other patient management decisions, including infection  control decisions, particularly in the presence of clinical signs and  symptoms consistent with COVID-19, or in those who have been in contact with the virus.  Negative results must be combined with clinical observations, patient  history, and epidemiological information. The expected result is Negative. Fact Sheet for Patients: PodPark.tn Fact Sheet for Healthcare Providers: GiftContent.is This test is not yet approved or cleared by the [place] FDA and  has been authorized for detection and/or diagnosis of SARS-CoV-2 by FDA under an Emergency Use Authorization (EUA).  This EUA will remain in effect (meaning this test can be used) for the duration of  the COVID-19 de claration under Section 564(b)(1) of the Act, 21 U.S.C. section 360bbb-3(b)(1), unless the authorization is terminated or revoked sooner.   Urine culture     Status: Abnormal (Preliminary result)   Collection Time: 12/10/19  1:50 PM   Specimen: In/Out Cath Urine  Result Value Ref Range   Specimen Description IN/OUT CATH URINE    Special Requests NONE    Culture (A)     >=100,000 COLONIES/mL ENTEROCOCCUS FAECALIS SUSCEPTIBILITIES TO FOLLOW Performed at [facility], [place] [address] [place], [place] [address]    Report Status PENDING   Urinalysis, Routine w reflex microscopic     Status: Abnormal   Collection  Time: 12/10/19  4:28 PM  Result Value Ref Range   Color, Urine AMBER (A) YELLOW    Comment: BIOCHEMICALS MAY BE AFFECTED BY COLOR   APPearance HAZY (A) CLEAR   Specific Gravity, Urine 1.028 1.005 - 1.030   pH 5.0 5.0 - 8.0   Glucose, UA NEGATIVE NEGATIVE mg/dL   Hgb urine dipstick NEGATIVE NEGATIVE   Bilirubin Urine NEGATIVE NEGATIVE   Ketones, ur NEGATIVE NEGATIVE mg/dL   Protein, ur 100 (A) NEGATIVE mg/dL   Nitrite NEGATIVE NEGATIVE   Leukocytes,Ua NEGATIVE NEGATIVE   RBC / HPF 0-5 0 - 5 RBC/hpf   WBC, UA 0-5 0 - 5 WBC/hpf   Bacteria, UA NONE SEEN NONE SEEN   Squamous Epithelial / LPF 0-5 0 - 5    Comment: Performed at [facility], [place] [address], [place], [place] [address]  CBC     Status: Abnormal   Collection Time: 12/10/19  6:21 PM  Result Value Ref Range   WBC 18.2 (H) 4.0 - 10.5 K/uL   RBC 4.31 4.22 - 5.81 MIL/uL   Hemoglobin 11.1 (L) 13.0 - 17.0 g/dL   HCT 36.6 (L) 39.0 - 52.0 %   MCV 84.9 80.0 - 100.0 fL   MCH 25.8 (L) 26.0 - 34.0 pg   MCHC 30.3 30.0 - 36.0 g/dL   RDW 22.9 (H) 11.5 - 15.5 %   Platelets 343 150 - 400 K/uL   nRBC 0.0 0.0 - 0.2 %    Comment: Performed at [facility] [address], [place], [place] [address]  Creatinine, serum     Status: Abnormal   Collection Time: 12/10/19  6:21 PM  Result Value Ref Range   Creatinine, Ser 1.24 0.61 - 1.24 mg/dL   GFR calc non Af Amer 53 (L) >60 mL/min   GFR calc Af Amer >60 >60 mL/min    Comment: Performed at [facility] [address], [place], [place] [address]  Magnesium     Status: None   Collection Time: 12/10/19  6:21 PM  Result Value Ref Range   Magnesium 2.3 1.7 - 2.4 mg/dL    Comment: Performed at [facility], [place] [address] [place], [place] [address]  Phosphorus     Status: None   Collection Time: 12/10/19  6:21 PM  Result Value Ref Range   Phosphorus 3.7 2.5 -  4.6 mg/dL    Comment: Performed at [facility], [place] [address], [place], [place] [address]  SARS  CORONAVIRUS 2 (TAT 6-24 HRS) Nasopharyngeal Nasopharyngeal Swab     Status: None   Collection Time: 12/10/19  7:02 PM   Specimen: Nasopharyngeal Swab  Result Value Ref Range   SARS Coronavirus 2 NEGATIVE NEGATIVE    Comment: (NOTE) SARS-CoV-2 target nucleic acids are NOT DETECTED. The SARS-CoV-2 RNA is generally detectable in upper and lower respiratory specimens during the acute phase of infection. Negative results do not preclude SARS-CoV-2 infection, do not rule out co-infections with other pathogens, and should not be used as the sole basis for treatment or other patient management decisions. Negative results must be combined with clinical observations, patient history, and epidemiological information. The expected result is Negative. Fact Sheet for Patients: SugarRoll.be Fact Sheet for Healthcare Providers: https://www.woods-mathews.com/ This test is not yet approved or cleared by the [place] FDA and  has been authorized for detection and/or diagnosis of SARS-CoV-2 by FDA under an Emergency Use Authorization (EUA). This EUA will remain  in effect (meaning this test can be used) for the duration of the COVID-19 declaration under Section 56 4(b)(1) of the Act, 21 U.S.C. section 360bbb-3(b)(1), unless the authorization is terminated or revoked sooner. Performed at [facility], [place] [address], [place], [place] [address]   Comprehensive metabolic panel     Status: Abnormal   Collection Time: 12/11/19  3:30 AM  Result Value Ref Range   Sodium 138 135 - 145 mmol/L   Potassium 4.1 3.5 - 5.1 mmol/L   Chloride 109 98 - 111 mmol/L   CO2 17 (L) 22 - 32 mmol/L   Glucose, Bld 125 (H) 70 - 99 mg/dL    Comment: Glucose reference range applies only to samples taken after fasting for at least 8 hours.   BUN 34 (H) 8 - 23 mg/dL   Creatinine, Ser 1.02 0.61 - 1.24 mg/dL   Calcium 8.3 (L) 8.9 - 10.3 mg/dL   Total Protein 5.7 (L) 6.5 - 8.1  g/dL   Albumin 2.0 (L) 3.5 - 5.0 g/dL   AST 41 15 - 41 U/L   ALT 51 (H) 0 - 44 U/L   Alkaline Phosphatase 140 (H) 38 - 126 U/L   Total Bilirubin 1.0 0.3 - 1.2 mg/dL   GFR calc non Af Amer >60 >60 mL/min   GFR calc Af Amer >60 >60 mL/min   Anion gap 12 5 - 15    Comment: Performed at [facility] [address], [place], [place] [address]  CBC     Status: Abnormal   Collection Time: 12/11/19  3:30 AM  Result Value Ref Range   WBC 15.9 (H) 4.0 - 10.5 K/uL   RBC 4.00 (L) 4.22 - 5.81 MIL/uL   Hemoglobin 10.4 (L) 13.0 - 17.0 g/dL   HCT 33.9 (L) 39.0 - 52.0 %   MCV 84.8 80.0 - 100.0 fL   MCH 26.0 26.0 - 34.0 pg   MCHC 30.7 30.0 - 36.0 g/dL   RDW 22.9 (H) 11.5 - 15.5 %   Platelets 331 150 - 400 K/uL   nRBC 0.0 0.0 - 0.2 %    Comment: Performed at [facility], [place] [address], [place], [place] [address]  Glucose, capillary     Status: Abnormal   Collection Time: 12/11/19  8:10 AM  Result Value Ref Range   Glucose-Capillary 105 (H) 70 -  99 mg/dL    Comment: Glucose reference range applies only to samples taken after fasting for at least 8 hours.   Recent Results (from the past 240 hour(s))  Blood Culture (routine x 2)     Status: Abnormal (Preliminary result)   Collection Time: 12/10/19 11:30 AM   Specimen: BLOOD  Result Value Ref Range Status   Specimen Description BLOOD RIGHT ANTECUBITAL  Final   Special Requests   Final    BOTTLES DRAWN AEROBIC AND ANAEROBIC Blood Culture adequate volume   Culture  Setup Time   Final    IN BOTH AEROBIC AND ANAEROBIC BOTTLES CRITICAL RESULT CALLED TO, READ BACK BY AND VERIFIED WITH: K AMEND PHARMD 12/11/19 0125 JDW GRAM POSITIVE COCCI    Culture (A)  Final    ENTEROCOCCUS FAECALIS CULTURE REINCUBATED FOR BETTER GROWTH Performed at [facility], [place] [address] [place], [place] [address]    Report Status PENDING  Incomplete  Blood Culture ID Panel (Reflexed)     Status: Abnormal   Collection Time: 12/10/19 11:30 AM  Result  Value Ref Range Status   Enterococcus species DETECTED (A) NOT DETECTED Final    Comment: CRITICAL RESULT CALLED TO, READ BACK BY AND VERIFIED WITH: K AMEND PHARMD 12/11/19 0125 JDW    Vancomycin resistance NOT DETECTED NOT DETECTED Final   Listeria monocytogenes NOT DETECTED NOT DETECTED Final   Staphylococcus species NOT DETECTED NOT DETECTED Final   Staphylococcus aureus (BCID) NOT DETECTED NOT DETECTED Final   Streptococcus species NOT DETECTED NOT DETECTED Final   Streptococcus agalactiae NOT DETECTED NOT DETECTED Final   Streptococcus pneumoniae NOT DETECTED NOT DETECTED Final   Streptococcus pyogenes NOT DETECTED NOT DETECTED Final   Acinetobacter baumannii NOT DETECTED NOT DETECTED Final   Enterobacteriaceae species NOT DETECTED NOT DETECTED Final   Enterobacter cloacae complex NOT DETECTED NOT DETECTED Final   Escherichia coli NOT DETECTED NOT DETECTED Final   Klebsiella oxytoca NOT DETECTED NOT DETECTED Final   Klebsiella pneumoniae NOT DETECTED NOT DETECTED Final   Proteus species NOT DETECTED NOT DETECTED Final   Serratia marcescens NOT DETECTED NOT DETECTED Final   Haemophilus influenzae NOT DETECTED NOT DETECTED Final   Neisseria meningitidis NOT DETECTED NOT DETECTED Final   Pseudomonas aeruginosa NOT DETECTED NOT DETECTED Final   Candida albicans NOT DETECTED NOT DETECTED Final   Candida glabrata NOT DETECTED NOT DETECTED Final   Candida krusei NOT DETECTED NOT DETECTED Final   Candida parapsilosis NOT DETECTED NOT DETECTED Final   Candida tropicalis NOT DETECTED NOT DETECTED Final    Comment: Performed at [facility], [place]. [address], [place], [place] [address]  Blood Culture (routine x 2)     Status: None (Preliminary result)   Collection Time: 12/10/19 11:37 AM   Specimen: BLOOD LEFT HAND  Result Value Ref Range Status   Specimen Description BLOOD LEFT HAND  Final   Special Requests   Final    BOTTLES DRAWN AEROBIC AND ANAEROBIC Blood Culture adequate volume    Culture  Setup Time   Final    IN BOTH AEROBIC AND ANAEROBIC BOTTLES GRAM POSITIVE COCCI IN CHAINS CRITICAL VALUE NOTED.  VALUE IS CONSISTENT WITH PREVIOUSLY REPORTED AND CALLED VALUE.    Culture   Final    GRAM POSITIVE COCCI CULTURE REINCUBATED FOR BETTER GROWTH Performed at [facility], [place] [address] [place], [place] [address]    Report Status PENDING  Incomplete  Urine culture     Status:  Abnormal (Preliminary result)   Collection Time: 12/10/19  1:50 PM   Specimen: In/Out Cath Urine  Result Value Ref Range Status   Specimen Description IN/OUT CATH URINE  Final   Special Requests NONE  Final   Culture (A)  Final    >=100,000 COLONIES/mL ENTEROCOCCUS FAECALIS SUSCEPTIBILITIES TO FOLLOW Performed at [facility], [place] [address] [place], [place] [address]    Report Status PENDING  Incomplete  SARS CORONAVIRUS 2 (TAT 6-24 HRS) Nasopharyngeal Nasopharyngeal Swab     Status: None   Collection Time: 12/10/19  7:02 PM   Specimen: Nasopharyngeal Swab  Result Value Ref Range Status   SARS Coronavirus 2 NEGATIVE NEGATIVE Final    Comment: (NOTE) SARS-CoV-2 target nucleic acids are NOT DETECTED. The SARS-CoV-2 RNA is generally detectable in upper and lower respiratory specimens during the acute phase of infection. Negative results do not preclude SARS-CoV-2 infection, do not rule out co-infections with other pathogens, and should not be used as the sole basis for treatment or other patient management decisions. Negative results must be combined with clinical observations, patient history, and epidemiological information. The expected result is Negative. Fact Sheet for Patients: SugarRoll.be Fact Sheet for Healthcare Providers: https://www.woods-mathews.com/ This test is not yet approved or cleared by the [place] FDA and  has been authorized for detection and/or diagnosis of SARS-CoV-2 by FDA under an Emergency Use  Authorization (EUA). This EUA will remain  in effect (meaning this test can be used) for the duration of the COVID-19 declaration under Section 56 4(b)(1) of the Act, 21 U.S.C. section 360bbb-3(b)(1), unless the authorization is terminated or revoked sooner. Performed at [facility], [place] [address] [place], [place] [address]    Creatinine: Recent Labs    12/10/19 1138 12/10/19 1821 12/11/19 0330  CREATININE 1.44* 1.24 1.02   Baseline Creatinine: 1  Impression/Assessment:  87yo with BPH with LUTS, urinary retention, left hydronephrosis and prostate cancer  Plan:  1. BPH with LUTS, urinary retention: We dsicussed the management including chronci indwelling foley for bladder rest as well as initiating alpha blocker therapy. We will start flomax 0.20m daily 2. Left hydronephrosis: His hydronephrosis may be related to BOO versus mass at the left distal ureter. Please obtain renal UKoreain 48 hours to ensure resolution of his hydronephrosis. If his hydronephrosis persists he would benefit from diagnostic ureteroscopy as an outpatient 3. Prostate Cancer: PSA is currently stable and the patient will followup with Dr. WJeffie Pollockafter discharge to discuss treatment options   PNicolette Bang3/02/2020, 1:57 PM

## 2019-12-11 NOTE — Progress Notes (Signed)
  Echocardiogram 2D Echocardiogram has been performed.  Derrick [name] 12/11/2019, 11:39 AM

## 2019-12-11 NOTE — Progress Notes (Signed)
[facility] Progress Note     Subjective: Patient previously seen by surgical service and is s/p IR perc cholecystostomy tube placement 09/18/19, tube was dislodged and then replaced 09/26/19. Capped 11/23/19. Patient remains with percutaneous cholecystostomy in place draining to gravity. Scheduled to have cholecystectomy with Dr. Marlou Starks 01/11/20. Asked by primary attending to evaluate given entercoccus bacteremia, at time of consult not felt to be from urinary source. On my exam patient begging for water. Denies abdominal pain.   Objective: Vital signs in last 24 hours: Temp:  [97.4 F (36.3 C)-99.1 F (37.3 C)] 97.4 F (36.3 C) (03/05 1206) Pulse Rate:  [97-137] 97 (03/05 1206) Resp:  [16-30] 20 (03/05 1206) BP: (118-169)/(73-99) 131/81 (03/05 1206) SpO2:  [98 %-100 %] 100 % (03/05 1206) Weight:  [73.4 kg] 73.4 kg (03/05 0503)    Intake/Output from previous day: 03/04 0701 - 03/05 0700 In: 1658.3 [I.V.:1508.3; IV Piggyback:100] Out: 1000 [Urine:1000] Intake/Output this shift: No intake/output data recorded.  PE: General: pleasant, WD, this white male who is laying in bed in NAD HEENT:  Sclera are anicteric. Ears and nose without any masses or lesions.  Mouth is pink and moist Heart: regular, rate, and rhythm.  Normal s1,s2. No obvious murmurs, gallops, or rubs noted.  Palpable radial and pedal pulses bilaterally Lungs: CTAB, no wheezes, rhonchi, or rales noted.  Respiratory effort nonlabored Abd: soft, NT, ND, +BS, no masses, hernias, or organomegaly, cholecystostomy tube to drainage with bilious non-purulent fluid GU: urinary bag present with bloody urine  MS: all 4 extremities are symmetrical with no cyanosis, clubbing, or edema. Skin: warm and dry with no masses, lesions, or rashes    Lab Results:  Recent Labs    12/10/19 1821 12/11/19 0330  WBC 18.2* 15.9*  HGB 11.1* 10.4*  HCT 36.6* 33.9*  PLT 343 331   BMET Recent Labs    12/10/19 1138 12/10/19 1138  12/10/19 1821 12/11/19 0330  NA 132*  --   --  138  K 4.3  --   --  4.1  CL 101  --   --  109  CO2 17*  --   --  17*  GLUCOSE 153*  --   --  125*  BUN 47*  --   --  34*  CREATININE 1.44*   < > 1.24 1.02  CALCIUM 8.8*  --   --  8.3*   < > = values in this interval not displayed.   PT/INR Recent Labs    12/10/19 1138  LABPROT 14.9  INR 1.2   CMP     Component Value Date/Time   NA 138 12/11/2019 0330   K 4.1 12/11/2019 0330   CL 109 12/11/2019 0330   CO2 17 (L) 12/11/2019 0330   GLUCOSE 125 (H) 12/11/2019 0330   BUN 34 (H) 12/11/2019 0330   CREATININE 1.02 12/11/2019 0330   CREATININE 1.68 (H) 04/09/2016 1037   CALCIUM 8.3 (L) 12/11/2019 0330   PROT 5.7 (L) 12/11/2019 0330   ALBUMIN 2.0 (L) 12/11/2019 0330   AST 41 12/11/2019 0330   ALT 51 (H) 12/11/2019 0330   ALKPHOS 140 (H) 12/11/2019 0330   BILITOT 1.0 12/11/2019 0330   GFRNONAA >60 12/11/2019 0330   GFRAA >60 12/11/2019 0330   Lipase     Component Value Date/Time   LIPASE 24 09/16/2019 1113       Studies/Results: CT Head Wo Contrast  Result Date: 12/10/2019 CLINICAL DATA:  Lethargy.  Altered mental status. EXAM: CT  HEAD WITHOUT CONTRAST TECHNIQUE: Contiguous axial images were obtained from the base of the skull through the vertex without intravenous contrast. COMPARISON:  September 17, 2019. FINDINGS: Brain: No acute intracranial hemorrhage, midline shift or edema. Stable interval appearance of asymmetrically prominent CSF-density in the left anterior parietal lobe sulcus compared to December 2020, possibly an underlying 2-3 cm arachnoid cyst or prominent sulcus secondary to adjacent cerebral atrophy. Extensive white matter disease in the bilateral corona radiata and chronic left lacunar infarction. Stable ventriculomegaly proportionate to cerebral volume loss without hydrocephalus. Backspace Vascular: Circumferential calcified atherosclerosis of a dominant left vertebral artery and the bilateral carotid siphons.  Skull: Diffuse bone demineralization. Stable 8 mm calvarial benign hemangioma in the right parietal bone. Diffuse skull demineralization. Sinuses/Orbits: Bilateral scleral banding. Mild bilateral ethmoidal mucosal thickening. Secretions in the nasal airway. Other: Chronic sclerosis in the dependent left mastoid air cells. No mastoid fluid bilaterally. IMPRESSION: No acute intracranial hemorrhage or edema. Redemonstration of extensive bilateral corona radiata white matter disease and stable interval appearance of prominent CSF-density in the left anterior parietal sulcus compared to December 2020. Electronically Signed   By: Revonda Humphrey   On: 12/10/2019 17:18   CT ABDOMEN PELVIS W CONTRAST  Result Date: 12/10/2019 CLINICAL DATA:  Sepsis. Lethargy. Recent gallbladder infection 6 weeks ago. EXAM: CT ABDOMEN AND PELVIS WITH CONTRAST TECHNIQUE: Multidetector CT imaging of the abdomen and pelvis was performed using the standard protocol following bolus administration of intravenous contrast. CONTRAST:  36mL OMNIPAQUE IOHEXOL 300 MG/ML  SOLN COMPARISON:  September 16, 2019. FINDINGS: Lower chest: Mild cardiomegaly. Coarse aortic valvular calcification. Coronary calcification. Minimal subpleural atelectasis in the lung bases. Hepatobiliary: A cholecystostomy pigtail drainage catheter coiled in a borderline dilated 4.2 cm gallbladder lumen. No gallbladder wall thickening or calcified gallstone. No intrahepatic biliary duct dilatation or intrahepatic abscess collection. Patient motion artifact without apparent dilatation of the common bile duct. Normally sized liver with smooth hepatic contours and no ascites. No para cholecystic fluid or abscess or biloma. Pancreas: Mild pancreatic atrophy. No dilatation of the main pancreatic duct. No sequela of acute or chronic pancreatitis. Spleen: Normal. Adrenals/Urinary Tract: Normal bilateral adrenal glands. Mild left hydroureteronephrosis to the level of the UVJ and abnormal  enhancement of the proximal and mid ureteral uroepithelium with moderate perinephric and periureteral inflammatory change. No ureteral calcified calculus. Remote right nephrectomy. A punctate left renal upper pole nonobstructing calculus. Marked urine distension of the urinary bladder. Stomach/Bowel: Mild nonspecific thickening of the gastric fundus mucosa with nondistention of the gastric lumen in this region and unusual invaginated appearance of the posterior fundal mucosa, which could be secondary to physiologic contraction or an underlying gastric mucosal lesion. The mucosa had a normal appearance in this region on the December 2020 study. Nonobstructed bowel. Small stool burden. No apparent bowel wall thickening. Normal appendix on axial series 3, image 46. Vascular/Lymphatic: Thoracoabdominal aorta calcified atherosclerosis with borderline 3.0 cm fusiform dilatation of the proximal and mid abdominal aorta. No adenopathy. Reproductive: Enlarged 6 cm prostate with mass effect on the posterior urinary bladder. Septated left hydrocele measuring 10 x 5 by 8 cm. Other: Small periumbilical herniation of fat without strangulation or incarceration. Symmetrical sizes of the bilateral iliac psoas musculature, including along the right nephrectomy bed. Musculoskeletal: A 9 mm lytic lesion in the L1 left posterior vertebral body and relative lucency in the L4 left pedicle and bilateral L5 pedicles, unchanged. Diffuse bone demineralization and advanced skeletal degenerative changes. Lower lumbar posterior decompression and advanced bulky facet arthropathy.  Healed right ninth, a, 7 rib fracture deformities. Chronic grade 1 spondylolisthesis of the lumbar spine, likely degenerative. IMPRESSION: Mild left hydroureteronephrosis to the level of the UVJ without an obstructing calcified ureteral calculus. Additional findings concerning for possible left upper urinary tract infection possible urinary outlet obstruction by an  enlarged prostate. Status post right nephrectomy without adenopathy. Stable 9 mm L1 lytic lesion and relative lucency in the L4 left pedicle and bilateral L5 pedicles; MR lumbar spine without and with intravenous contrast could differentiate a benign from metastatic etiology as clinically appropriate. Thoracoabdominal aortic calcified atherosclerosis and 3.0 cm fusiform dilatation of the abdominal aorta. Recommend followup by ultrasound in 3 years. This recommendation follows ACR consensus guidelines: White Paper of the ACR Incidental Findings Committee II on Vascular Findings. J Am Coll Radiol 2013; 10:789-794. Aortic aneurysm NOS (ICD10-I71.9) Mild nonspecific gastritis in the fundal region which is nondistended and has an unusual invaginated appearance that could be secondary to physiologic contraction or interval development of an underlying gastric mucosal lesion since December 2020. Upper gastrointestinal barium fluoroscopy could be considered. Mild cardiomegaly with coarse aortic valvular calcification. Large left septated hydrocele. Residual mild gallbladder dilatation with appropriate placement of a cholecystostomy tube and no complicating features. Electronically Signed   By: Revonda Humphrey   On: 12/10/2019 16:56   DG Chest Port 1 View  Result Date: 12/10/2019 CLINICAL DATA:  Sepsis, weakness EXAM: PORTABLE CHEST 1 VIEW COMPARISON:  09/16/2019 FINDINGS: The heart size and mediastinal contours are within normal limits. Calcific aortic knob. No focal airspace consolidation, pleural effusion, or pneumothorax. Degenerative changes of the bilateral shoulders. IMPRESSION: No active disease. Electronically Signed   By: Davina Poke D.O.   On: 12/10/2019 12:15    Anti-infectives: Anti-infectives (From admission, onward)   Start     Dose/Rate Route Frequency Ordered Stop   12/11/19 1200  cefTRIAXone (ROCEPHIN) 1 g in sodium chloride 0.9 % 100 mL IVPB  Status:  Discontinued     1 g 200 mL/hr over 30  Minutes Intravenous  Once 12/10/19 1831 12/11/19 0143   12/11/19 0800  ampicillin (OMNIPEN) 2 g in sodium chloride 0.9 % 100 mL IVPB     2 g 300 mL/hr over 20 Minutes Intravenous Every 4 hours 12/11/19 0143     12/11/19 0200  ampicillin (OMNIPEN) 2 g in sodium chloride 0.9 % 100 mL IVPB     2 g 300 mL/hr over 20 Minutes Intravenous NOW 12/11/19 0143 12/11/19 0401   12/10/19 1145  cefTRIAXone (ROCEPHIN) 2 g in sodium chloride 0.9 % 100 mL IVPB     2 g 200 mL/hr over 30 Minutes Intravenous  Once 12/10/19 1138 12/10/19 1240   12/10/19 1145  metroNIDAZOLE (FLAGYL) IVPB 500 mg     500 mg 100 mL/hr over 60 Minutes Intravenous  Once 12/10/19 1139 12/10/19 1254       Assessment/Plan Atrial fibrillation Coronary artery disease Peripheral artery disease Rheumatoid arthritis History of CVA History of renal cell carcinoma, status post nephrectomy History of prostate cancer Hypertension Chronic kidney disease  Enterococcal bacteremia secondary to UTI - urine culture with >100K colonies enterococcus faecalis, defer management of this to primary team  Cholecystostomy tube in place - biliary source of bacteremia not suspected, drainage from cholecystostomy tube does not appear purulent and abdominal exam is benign - recommend following with Dr. Marlou Starks for cholecystectomy 01/11/20 as planned - no acute surgical intervention warranted at this time - general surgery will sign off   LOS: 1 day  Brigid Re , [facility] 12/11/2019, 1:55 PM Please see Amion for pager number during day hours 7:00am-4:30pm

## 2019-12-11 NOTE — Evaluation (Signed)
Occupational Therapy Evaluation Patient Details Name: Derrick [name] MRN: [id number] DOB: 1932-01-23 Today's Date: 12/11/2019    History of Present Illness Patient is a 86 y/o male who presents with AMS, lethargy and weakness. Admitted with sepsis in the setting of hydroureteronephrosis of left kidney. Also with A-fib with RVR. Pt with recent gallbladder surgery. PMH includes right renal carcinoma s/p right nephrectomy, cholecystitis s/p cholecystectomy and tube placement, CVA, HTN, RA, memory loss, CKD, CAD, PAD, back surgery, and right TM amputation.   Clinical Impression   Patient is an 86 year old male, unable to obtain PLOF information from patient and no family present at time of OT eval. Per chart patient lives with spouse and uses a walker for ambulation. Currently, patient with decreased cognition, strength, activity tolerance, mobility requiring total A to wash his face and max A x2 for repositioning in bed. Despite verbal, tactile cues patient does not initiate tasks as directed and has increased difficulty maintaining attention. Recommend continued acute OT services to maximize patient safety and independence with self care.    Follow Up Recommendations  SNF    Equipment Recommendations  Other (comment)(defer to next venue)       Precautions / Restrictions Precautions Precautions: Fall Restrictions Weight Bearing Restrictions: No      Mobility Bed Mobility Overal bed mobility: Needs Assistance Bed Mobility: Rolling Rolling: Max assist;+2 for physical assistance         General bed mobility comments: roll patient to L side for repositioning, does not initiate/unable to assist with use of rails requiring maximal assistance  Transfers Overall transfer level: Needs assistance               General transfer comment: deferred due to patient cognition, limited activity tolerance and direction following    Balance Overall balance assessment: Needs assistance      Sitting balance - Comments: deferred                                   ADL either performed or assessed with clinical judgement   ADL Overall ADL's : Needs assistance/impaired     Grooming: Total assistance;Wash/dry face Grooming Details (indicate cue type and reason): multiple attempts with hand over hand assist however patient unable to complete task, does not attempt to grasp wash cloth when placed in his hand or to lift UE towards his face with total A.                               General ADL Comments: currently patient requires max to total assistance with self care tasks due to decreased cognition, strength, activity tolerance                  Pertinent Vitals/Pain Pain Assessment: Faces Faces Pain Scale: Hurts little more Pain Location: Neck Pain Descriptors / Indicators: Aching Pain Intervention(s): Repositioned;Monitored during session     Hand Dominance Right   Extremity/Trunk Assessment Upper Extremity Assessment Upper Extremity Assessment: Generalized weakness;Difficult to assess due to impaired cognition   Lower Extremity Assessment Lower Extremity Assessment: Defer to PT evaluation       Communication Communication Communication: Expressive difficulties;Other (comment)(soft mumbled speech, difficult to understand at times)   Cognition Arousal/Alertness: Lethargic Behavior During Therapy: Flat affect Overall Cognitive Status: Impaired/Different from baseline Area of Impairment: Orientation;Attention;Memory;Following commands;Safety/judgement;Awareness;Problem solving  Orientation Level: Disoriented to;Place;Time;Situation Current Attention Level: Sustained Memory: Decreased short-term memory Following Commands: Follows one step commands inconsistently Safety/Judgement: Decreased awareness of deficits;Decreased awareness of safety Awareness: Intellectual Problem Solving: Requires verbal  cues;Requires tactile cues;Decreased initiation;Slow processing General Comments: max difficulty maintaining patient's attention, frequently closing his eyes. limited carry over of directions   General Comments  VSS on 2L O2            Home Living Family/patient expects to be discharged to:: Skilled nursing facility                                 Additional Comments: patient unable to provide history, per chart patient lives with spouse and was using a walker at baseline.       Prior Functioning/Environment Level of Independence: Needs assistance  Gait / Transfers Assistance Needed: per PT eval spoke with spouse, pt is ambulating in home - sometimes using RW and sometimes not ADL's / Homemaking Assistance Needed: unsure, no family present at time of OT eval, patient unable to provide info            OT Problem List: Decreased strength;Decreased range of motion;Decreased activity tolerance;Impaired balance (sitting and/or standing);Decreased cognition;Decreased safety awareness      OT Treatment/Interventions: Self-care/ADL training;Therapeutic exercise;DME and/or AE instruction;Therapeutic activities;Cognitive remediation/compensation;Patient/family education;Balance training    OT Goals(Current goals can be found in the care plan section) Acute Rehab OT Goals Patient Stated Goal: "I need water" OT Goal Formulation: Patient unable to participate in goal setting Time For Goal Achievement: 12/25/19 Potential to Achieve Goals: Good  OT Frequency: Min 2X/week    AM-PAC OT "6 Clicks" Daily Activity     Outcome Measure Help from another person eating meals?: Total Help from another person taking care of personal grooming?: Total Help from another person toileting, which includes using toliet, bedpan, or urinal?: Total Help from another person bathing (including washing, rinsing, drying)?: Total Help from another person to put on and taking off regular upper body  clothing?: Total Help from another person to put on and taking off regular lower body clothing?: Total 6 Click Score: 6   End of Session Nurse Communication: Mobility status  Activity Tolerance: Patient limited by lethargy Patient left: in bed;with call bell/phone within reach;with bed alarm set  OT Visit Diagnosis: Other abnormalities of gait and mobility (R26.89);Muscle weakness (generalized) (M62.81);Other symptoms and signs involving cognitive function                Time: [id number] OT Time Calculation (min): 20 min Charges:  OT General Charges $OT Visit: 1 Visit OT Evaluation $OT Eval Low Complexity: King OT OT office: [place] 12/11/2019, 11:27 AM

## 2019-12-11 NOTE — Consult Note (Signed)
Consultation Note Date: 12/11/2019   Patient Name: Derrick [name]  DOB: 03-14-1932  MRN: [id number]  Age / Sex: 86 y.o., male  PCP: [name], MD Referring Physician: Isaac Bliss, Olam Idler*  Reason for Consultation: Establishing goals of care  HPI/Patient Profile: 86 y.o. male  with past medical history of CAD, PAD, HTN, CKD III, right renal carcinoma s/p right nephrectomy, recent admission in December for sepsis secondary to cholecystitis s/p percutaneous drainage and scheduled for cholecystectomy next month who was admitted on 12/10/2019 with progressive weakness, anorexia and lethargy. He was found to be bacteremic with enterococcus secondary to urinary source.   Clinical Assessment and Goals of Care:  I have reviewed medical records including EPIC notes, labs and imaging, examined the patient and called the patient's son, [name], to discuss diagnosis prognosis, GOC, EOL wishes, disposition and options.  I introduced Palliative Medicine as specialized medical care for people living with serious illness. It focuses on providing relief from the symptoms and stress of a serious illness.   As far as functional and nutritional status, [name] states his father had recovered quite well from admission in December after a stay in rehab. He and his wife live at home together. He is able to perform ADLs on his own and converse normally with occasional lapses in memory. [name] noticed a significant change in the last 2 weeks. It began as weakness and fatigue which progressed to poor appetite and confusion. He says this was almost identical to his presentation in December when he was septic with cholecystitis.   We discussed his current illness and what it means in the larger context of his on-going co-morbidities. [name] remains hopeful that his father will recover from this acute infection similar to last time so that he can  proceed with scheduled gallbladder surgery in a month.   I attempted to elicit values and goals of care important to the patient. [name] tells me he has not discussed advanced directives or concepts specific to code status with his parents at this point, but states his father would definitely want to be full code.   We discussed that we will remain hopeful that Mr. Medici would start to improve on antibiotics over the next few days. If he does not start to improve, he agrees that a family meeting should take place to discuss next steps and goals of care.    Questions and concerns were addressed.  The family was encouraged to call with questions or concerns.     Primary Decision Maker:  NEXT OF KIN    SUMMARY OF RECOMMENDATIONS    Code Status/Advance Care Planning:  Full code, full scope of treatment   Will continue to have ongoing discussions if he does not show improvement on antibiotics    Symptom Management:   Urology managing lower urinary tract symptoms; has been initiated on flomax  Topical analgesics, tylenol for neck pain  Additional Recommendations (Limitations, Scope, Preferences):  Full Scope Treatment  Palliative Prophylaxis:   Delirium Protocol and Frequent Pain Assessment  Prognosis:   Unable to determine   Discharge Planning: To Be Determined      Primary Diagnoses: Present on Admission: . Sepsis ([place]) . Hydronephrosis of left kidney . CKD (chronic kidney disease) . HTN (hypertension) . Anemia . Atrial fibrillation with RVR ([place]) . AMS (altered mental status) . RA (rheumatoid arthritis) ([place]) . Depression   I have reviewed the medical record, interviewed the patient and family, and examined the patient. The following aspects are pertinent.  Past Medical History:  Diagnosis Date  . Anemia   . Anxiety   . Arthritis, rheumatoid ([place])   . BPH (benign prostatic hyperplasia)   . CAD (coronary artery disease)   . CKD (chronic kidney disease)     . Depression   . Diverticulosis   . Elevated PSA   . Gangrene ([place])    of toe right foot  . GERD (gastroesophageal reflux disease)   . Herpes simplex labialis   . High cholesterol   . History of colonic polyps   . Hypertension   . Insomnia   . Lacunar infarction ([place])   . Memory loss   . Obesity   . Panic attacks   . Prostate cancer ([place]) YRS AGO  . Renal cell cancer ([place])    s/p nephrectomy in 2000  . Seropositive rheumatoid arthritis ([place])   . Spermatocele   . Tinnitus   . Tremor of both hands    Social History   Socioeconomic History  . Marital status: Married    Spouse name: Not on file  . Number of children: Not on file  . Years of education: Not on file  . Highest education level: Not on file  Occupational History  . Not on file  Tobacco Use  . Smoking status: Former Smoker    Packs/day: 1.00    Years: 29.00    Pack years: 29.00    Types: Cigarettes    Quit date: 10/08/1978    Years since quitting: 41.2  . Smokeless tobacco: Never Used  . Tobacco comment: tried smokeless tobacco once but made him sick  Substance and Sexual Activity  . Alcohol use: No  . Drug use: No  . Sexual activity: Not on file  Other Topics Concern  . Not on file  Social History Narrative   Retired   Married   4 children         Social Determinants of Radio broadcast assistant Strain:   . Difficulty of Paying Living Expenses: Not on file  Food Insecurity:   . Worried About Charity fundraiser in the Last Year: Not on file  . Ran Out of Food in the Last Year: Not on file  Transportation Needs:   . Lack of Transportation (Medical): Not on file  . Lack of Transportation (Non-Medical): Not on file  Physical Activity:   . Days of Exercise per Week: Not on file  . Minutes of Exercise per Session: Not on file  Stress:   . Feeling of Stress : Not on file  Social Connections:   . Frequency of Communication with Friends and Family: Not on file  . Frequency of Social  Gatherings with Friends and Family: Not on file  . Attends Religious Services: Not on file  . Active Member of Clubs or Organizations: Not on file  . Attends Archivist Meetings: Not on file  . Marital Status: Not on file   Family History  Problem Relation Age of Onset  . Heart  attack Father 69  . Stroke Father   . Kidney failure Sister        Bright's disease   . Cancer Paternal Aunt        d. 50s; NOS cancer of leg  . Kidney disease Sister        d. 89; "Bright's disease" - possible kidney cancer?  . Cancer Sister        NOS cancer; d. 73s; "sick in the hospital"  . Heart attack Son 5  . Leukemia Son 33  . COPD Son   . Breast cancer Daughter 11   Scheduled Meds: . Chlorhexidine Gluconate Cloth  6 each Topical Daily  . enoxaparin (LOVENOX) injection  40 mg Subcutaneous Q24H   Continuous Infusions: . sodium chloride 125 mL/hr at 12/11/19 1154  . ampicillin (OMNIPEN) IV 2 g (12/11/19 1524)  . diltiazem (CARDIZEM) infusion 5 mg/hr (12/10/19 2147)   PRN Meds:.acetaminophen **OR** acetaminophen, diclofenac Sodium, hydrALAZINE Allergies  Allergen Reactions  . Effexor [Venlafaxine] Other (See Comments)    Sexual side affects  . Lisinopril Cough  . Paxil [Paroxetine] Other (See Comments)    Sexual side affects  . Viagra [Sildenafil] Other (See Comments)    Chest discomfort  . Wellbutrin [Bupropion] Itching    Review of Systems  Unable to perform ROS: Acuity of condition     Physical Exam  General: chronically ill-appearing, lethargic, NAD HEENT: keeps eyes close throughout encounter, dry mucous membranes  Neck: no nuchal rigidity  CV: irregularly irregular  Pulm: normal work of breathing; lungs CTA in anterior fields  Abd: abdomen soft, non-tender, non-distended; percutaneous drain dressing c/d/i Ext: no edema; s/p right foot amputation  Neuro: disoriented, answers questions minimally    Vital Signs: BP 131/81 (BP Location: Left Arm)   Pulse 97   Temp  (!) 97.4 F (36.3 C) (Oral)   Resp 20   Ht 5\' 9"  (1.753 m)   Wt 73.4 kg   SpO2 100%   BMI 23.89 kg/m  Pain Scale: 0-10   Pain Score: 0-No pain   SpO2: SpO2: 100 % O2 Device:SpO2: 100 % O2 Flow Rate: .O2 Flow Rate (L/min): 2 L/min  IO: Intake/output summary:   Intake/Output Summary (Last 24 hours) at 12/11/2019 1611 Last data filed at 12/11/2019 1524 Gross per 24 hour  Intake 1958.38 ml  Output 2600 ml  Net -641.62 ml    LBM: Last BM Date: 12/11/19 Baseline Weight: Weight: 72.6 kg Most recent weight: Weight: 73.4 kg     Palliative Assessment/Data: 40%     Time In: 3:30 Time Out: 4:45 Time Total: 75 minutes  Visit consisted of counseling and education dealing with the complex and emotionally intense issues surrounding the need for palliative care and symptom management in the setting of serious and potentially life-threatening illness. Greater than 50%  of this time was spent counseling and coordinating care related to the above assessment and plan.  Signed by: Modena Nunnery, DO PGY-2  Please contact Palliative Medicine Team phone at [phone] for questions and concerns.  For individual provider: See Amion  Juel Burrow, DNP, [facility] Palliative Medicine Team Team Phone # [phone]  Pager # [phone]

## 2019-12-11 NOTE — Consult Note (Signed)
Derrick [name] for Infectious Disease    Date of Admission:  12/10/2019     Total days of antibiotics 2   Ampicillin 2                Reason for Consult: Enterococcal bacteremia     Referring Provider: CHAMP  Primary Care Provider: [name], MD    Assessment: Derrick [name] is a 86 y.o. male here for evaluation of AMS with fevers. Found to have enterococcal faecalis bacteremia 4/4 bottles. Imaging indicates possible pyelonephritis/urethral inflammation and mild hydronephrosis of single remaining left kidney. His urine also contains 100K colonies of enterococcus faecalis. He had distinct urinary retention on CT scan but he is unable to disclose whether he had any urinary complaints. He has some abdominal tenderness on exam but more in the location of existing GB drain. CT scan indicates good drain placement but some ongoing dilation of the duct -- he is slated for cholecystectomy in April.   Will eval for consideration of endocarditis with transthoracic echocardiogram. Repeat blood cultures. Continue Ampicillin alone for now.  He has a history of osteomyelitis of the right foot/toes but this is now S/P transmet amputation and site well healed and unremarkable.     Plan: 1. TTE 2. Repeat blood cultures tomorrow  3. Continue ampicillin alone    Principal Problem:   Enterococcal bacteremia Active Problems:   Sepsis ([name])   Hydronephrosis of left kidney   HTN (hypertension)   Anemia   CKD (chronic kidney disease)   RA (rheumatoid arthritis) ([facility])   Atrial fibrillation with RVR ([facility])   AMS (altered mental status)   Depression   . Chlorhexidine Gluconate Cloth  6 each Topical Daily  . enoxaparin (LOVENOX) injection  40 mg Subcutaneous Q24H    HPI: Derrick [name] is a 86 y.o. male here from home for evaluation of shaking chills x 1 day.   PMHx detailed below but significant for renal cell cancer s/p R nephrectomy 2000, prostate cancer, memory loss, HTN, RA, GERD,  HLD, CAD, CKD, BPH, afib. Recently had chole drain in place since December with plans for cholecystecomy in late April 2021.   He lives at home with is wife. States that he had shaking chills one day then came to the hospital for evaluation. Presented with [facility] with AMS and lethargic. Patient unable to provide much else with regards to history due to baseline cognitive deficits.   CT abd/pelvis - mild L hydroureteronephrosis with perinephritic and periuretal inflammatory changes. Punctate left renal upper pole non obstructing calculus is in place. Marked urine distension of the bladder. Enlarged prostate. Residual mild gallbladder dilation with appropriate placement of a cholecystostomy tube without complicating features.   CT head demonstrates stable ventriculomegaly without hydrocephalus. Extensive b/l corona radiata white matter disease.   Had a 2D echo in December 2020 - mild/moderate MR with eccentric jet;  preserved EF 55-60%   Review of Systems  Constitutional: Positive for chills. Negative for fever, malaise/fatigue and weight loss.  HENT: Negative for sore throat.        No dental problems  Respiratory: Negative for cough and sputum production.   Cardiovascular: Negative for chest pain and leg swelling.  Gastrointestinal: Negative for abdominal pain, diarrhea and vomiting.  Genitourinary: Negative for dysuria and flank pain.  Musculoskeletal: Negative for joint pain, myalgias and neck pain.  Skin: Negative for rash.  Neurological: Negative for dizziness, tingling and headaches.  Limited ROS with  patient baseline memory deficit    Past Medical History:  Diagnosis Date  . Anemia   . Anxiety   . Arthritis, rheumatoid ([place])   . BPH (benign prostatic hyperplasia)   . CAD (coronary artery disease)   . CKD (chronic kidney disease)   . Depression   . Diverticulosis   . Elevated PSA   . Gangrene ([place])    of toe right foot  . GERD (gastroesophageal reflux disease)   . Herpes  simplex labialis   . High cholesterol   . History of colonic polyps   . Hypertension   . Insomnia   . Lacunar infarction ([place])   . Memory loss   . Obesity   . Panic attacks   . Prostate cancer ([place]) YRS AGO  . Renal cell cancer ([place])    s/p nephrectomy in 2000  . Seropositive rheumatoid arthritis ([place])   . Spermatocele   . Tinnitus   . Tremor of both hands     Social History   Tobacco Use  . Smoking status: Former Smoker    Packs/day: 1.00    Years: 29.00    Pack years: 29.00    Types: Cigarettes    Quit date: 10/08/1978    Years since quitting: 41.2  . Smokeless tobacco: Never Used  . Tobacco comment: tried smokeless tobacco once but made him sick  Substance Use Topics  . Alcohol use: No  . Drug use: No    Family History  Problem Relation Age of Onset  . Heart attack Father 46  . Stroke Father   . Kidney failure Sister        Bright's disease   . Cancer Paternal Aunt        d. 59s; NOS cancer of leg  . Kidney disease Sister        d. 42; "Bright's disease" - possible kidney cancer?  . Cancer Sister        NOS cancer; d. 69s; "sick in the hospital"  . Heart attack Son 30  . Leukemia Son 42  . COPD Son   . Breast cancer Daughter 13   Allergies  Allergen Reactions  . Effexor [Venlafaxine] Other (See Comments)    Sexual side affects  . Lisinopril Cough  . Paxil [Paroxetine] Other (See Comments)    Sexual side affects  . Viagra [Sildenafil] Other (See Comments)    Chest discomfort  . Wellbutrin [Bupropion] Itching    OBJECTIVE: Blood pressure 133/75, pulse (!) 103, temperature 99.1 F (37.3 C), temperature source Oral, resp. rate 20, height 5\' 9"  (1.753 m), weight 73.4 kg, SpO2 100 %.  Physical Exam Constitutional:      Appearance: He is not ill-appearing.     Comments: Sleeping upright in bed. Awakens easily, keeps eyes closed.   HENT:     Nose: No congestion.     Mouth/Throat:     Mouth: Mucous membranes are dry.     Pharynx: No oropharyngeal  exudate.  Eyes:     Comments: Keeps eyes closed  Cardiovascular:     Rate and Rhythm: Normal rate. Rhythm irregular.     Heart sounds: Murmur ([id number] systolic murmur @ apex) present. No friction rub. No gallop.   Pulmonary:     Effort: Pulmonary effort is normal.     Breath sounds: Normal breath sounds.  Abdominal:     Tenderness: There is abdominal tenderness (RUQ overlying existing drain).     Comments: Drain with bilious/brown contents. Dressing clean/dry  Skin:    General: Skin is warm and dry.     Capillary Refill: Capillary refill takes less than 2 seconds.  Neurological:     Mental Status: He is oriented to person, place, and time.     Lab Results Lab Results  Component Value Date   WBC 15.9 (H) 12/11/2019   HGB 10.4 (L) 12/11/2019   HCT 33.9 (L) 12/11/2019   MCV 84.8 12/11/2019   PLT 331 12/11/2019    Lab Results  Component Value Date   CREATININE 1.02 12/11/2019   BUN 34 (H) 12/11/2019   NA 138 12/11/2019   K 4.1 12/11/2019   CL 109 12/11/2019   CO2 17 (L) 12/11/2019    Lab Results  Component Value Date   ALT 51 (H) 12/11/2019   AST 41 12/11/2019   ALKPHOS 140 (H) 12/11/2019   BILITOT 1.0 12/11/2019     Microbiology: Recent Results (from the past 240 hour(s))  Blood Culture (routine x 2)     Status: None (Preliminary result)   Collection Time: 12/10/19 11:30 AM   Specimen: BLOOD  Result Value Ref Range Status   Specimen Description BLOOD RIGHT ANTECUBITAL  Final   Special Requests   Final    BOTTLES DRAWN AEROBIC AND ANAEROBIC Blood Culture adequate volume   Culture  Setup Time   Final    IN BOTH AEROBIC AND ANAEROBIC BOTTLES CRITICAL RESULT CALLED TO, READ BACK BY AND VERIFIED WITH: K AMEND PHARMD 12/11/19 0125 JDW GRAM POSITIVE COCCI Performed at [facility], [address] [place], [place] [address]    Culture GRAM POSITIVE COCCI  Final   Report Status PENDING  Incomplete  Blood Culture ID Panel (Reflexed)     Status: Abnormal    Collection Time: 12/10/19 11:30 AM  Result Value Ref Range Status   Enterococcus species DETECTED (A) NOT DETECTED Final    Comment: CRITICAL RESULT CALLED TO, READ BACK BY AND VERIFIED WITH: K AMEND PHARMD 12/11/19 0125 JDW    Vancomycin resistance NOT DETECTED NOT DETECTED Final   Listeria monocytogenes NOT DETECTED NOT DETECTED Final   Staphylococcus species NOT DETECTED NOT DETECTED Final   Staphylococcus aureus (BCID) NOT DETECTED NOT DETECTED Final   Streptococcus species NOT DETECTED NOT DETECTED Final   Streptococcus agalactiae NOT DETECTED NOT DETECTED Final   Streptococcus pneumoniae NOT DETECTED NOT DETECTED Final   Streptococcus pyogenes NOT DETECTED NOT DETECTED Final   Acinetobacter baumannii NOT DETECTED NOT DETECTED Final   Enterobacteriaceae species NOT DETECTED NOT DETECTED Final   Enterobacter cloacae complex NOT DETECTED NOT DETECTED Final   Escherichia coli NOT DETECTED NOT DETECTED Final   Klebsiella oxytoca NOT DETECTED NOT DETECTED Final   Klebsiella pneumoniae NOT DETECTED NOT DETECTED Final   Proteus species NOT DETECTED NOT DETECTED Final   Serratia marcescens NOT DETECTED NOT DETECTED Final   Haemophilus influenzae NOT DETECTED NOT DETECTED Final   Neisseria meningitidis NOT DETECTED NOT DETECTED Final   Pseudomonas aeruginosa NOT DETECTED NOT DETECTED Final   Candida albicans NOT DETECTED NOT DETECTED Final   Candida glabrata NOT DETECTED NOT DETECTED Final   Candida krusei NOT DETECTED NOT DETECTED Final   Candida parapsilosis NOT DETECTED NOT DETECTED Final   Candida tropicalis NOT DETECTED NOT DETECTED Final    Comment: Performed at [facility], [place]. [address] [place], [place] [address]  Blood Culture (routine x 2)     Status: None (Preliminary result)   Collection Time: 12/10/19 11:37 AM  Specimen: BLOOD LEFT HAND  Result Value Ref Range Status   Specimen Description BLOOD LEFT HAND  Final   Special Requests   Final    BOTTLES DRAWN AEROBIC  AND ANAEROBIC Blood Culture adequate volume   Culture  Setup Time   Final    IN BOTH AEROBIC AND ANAEROBIC BOTTLES GRAM POSITIVE COCCI IN CHAINS CRITICAL VALUE NOTED.  VALUE IS CONSISTENT WITH PREVIOUSLY REPORTED AND CALLED VALUE.    Culture   Final    GRAM POSITIVE COCCI CULTURE REINCUBATED FOR BETTER GROWTH Performed at [facility], [place] [address], [place], [place] [address]    Report Status PENDING  Incomplete  SARS CORONAVIRUS 2 (TAT 6-24 HRS) Nasopharyngeal Nasopharyngeal Swab     Status: None   Collection Time: 12/10/19  7:02 PM   Specimen: Nasopharyngeal Swab  Result Value Ref Range Status   SARS Coronavirus 2 NEGATIVE NEGATIVE Final    Comment: (NOTE) SARS-CoV-2 target nucleic acids are NOT DETECTED. The SARS-CoV-2 RNA is generally detectable in upper and lower respiratory specimens during the acute phase of infection. Negative results do not preclude SARS-CoV-2 infection, do not rule out co-infections with other pathogens, and should not be used as the sole basis for treatment or other patient management decisions. Negative results must be combined with clinical observations, patient history, and epidemiological information. The expected result is Negative. Fact Sheet for Patients: SugarRoll.be Fact Sheet for Healthcare Providers: https://www.woods-mathews.com/ This test is not yet approved or cleared by the [place] FDA and  has been authorized for detection and/or diagnosis of SARS-CoV-2 by FDA under an Emergency Use Authorization (EUA). This EUA will remain  in effect (meaning this test can be used) for the duration of the COVID-19 declaration under Section 56 4(b)(1) of the Act, 21 U.S.C. section 360bbb-3(b)(1), unless the authorization is terminated or revoked sooner. Performed at [facility], [place] [address] [place], [place] [address]      Janene Madeira, MSN, NP-C [facility] Infectious  D[facility] [email or web address] Pager: [phone] Office: [place]: [phone]  12/11/2019 10:36 AM

## 2019-12-11 NOTE — Evaluation (Signed)
Physical Therapy Evaluation Patient Details Name: Derrick [name] MRN: [id number] DOB: 04-02-1932 Today's Date: 12/11/2019   History of Present Illness  Patient is a 86 y/o male who presents with AMS, lethargy and weakness. Admitted with sepsis in the setting of hydroureteronephrosis of left kidney. Also with A-fib with RVR. Pt with recent gallbladder surgery. PMH includes right renal carcinoma s/p right nephrectomy, cholecystitis s/p cholecystectomy and tube placement, CVA, HTN, RA, memory loss, CKD, CAD, PAD, back surgery, and right TM amputation.  Clinical Impression  Patient presents with generalized weakness, decreased AROM BUEs/LE, impaired cognition, lethargy, pain and impaired mobility s/p above. Pt not able to provide PLOF/history due to AMS. Per chart, pt lives at home with his wife and uses RW for ambulation. Today, pt oriented to self only, difficulty sustaining attention and poor ability to follow commands consistently. Max-Total A for bed mobility. HR up to 124 bpm with limited activity. Would benefit from SNF to maximize independence and mobility prior to return home. Will follow acutely.    Follow Up Recommendations SNF;Supervision for mobility/OOB    Equipment Recommendations  Other (comment)(defer to post acute)    Recommendations for Other Services       Precautions / Restrictions Precautions Precautions: Fall Restrictions Weight Bearing Restrictions: No      Mobility  Bed Mobility Overal bed mobility: Needs Assistance Bed Mobility: Rolling;Supine to Sit Rolling: Max assist;+2 for physical assistance   Supine to sit: Total assist;+2 for safety/equipment     General bed mobility comments: Rolling to left side, assist with LEs to get to EOB and with trunk. Pt with minimal initiation of movement. Got half sitting up and pt moaning in pain so return to supine.  Transfers                 General transfer comment: Deferred due to safety and needing another  person for assist.  Ambulation/Gait                Stairs            Wheelchair Mobility    Modified Rankin (Stroke Patients Only)       Balance       Sitting balance - Comments: Unable to get to EOB today                                     Pertinent Vitals/Pain Pain Assessment: Faces Faces Pain Scale: Hurts little more Pain Location: Neck Pain Descriptors / Indicators: Aching;Sore Pain Intervention(s): Monitored during session;Repositioned    Home Living Family/patient expects to be discharged to:: Skilled nursing facility                 Additional Comments: patient unable to provide history, per chart patient lives with spouse and was using a walker at baseline.     Prior Function     Gait / Transfers Assistance Needed: Per chart, pt uses RW for ambulation.     Comments: No family present to provide PLOF/history.     Hand Dominance   Dominant Hand: Right    Extremity/Trunk Assessment   Upper Extremity Assessment Upper Extremity Assessment: (Limited AROM throughout vs not following commands.)    Lower Extremity Assessment Lower Extremity Assessment: Difficult to assess due to impaired cognition(Able to minimally move ankles to command and otherwise spontaneously moving LEs.)    Cervical / Trunk Assessment Cervical / Trunk Assessment: (neck  hurting and positioned in side bed position to the right.)  Communication   Communication: Expressive difficulties(soft mumbled speech at times)  Cognition Arousal/Alertness: Lethargic Behavior During Therapy: Flat affect Overall Cognitive Status: Impaired/Different from baseline Area of Impairment: Orientation;Attention;Memory;Following commands;Safety/judgement;Awareness;Problem solving                 Orientation Level: Disoriented to;Place;Time;Situation Current Attention Level: Sustained Memory: Decreased short-term memory Following Commands: Follows one step  commands inconsistently Safety/Judgement: Decreased awareness of deficits;Decreased awareness of safety Awareness: Intellectual Problem Solving: Requires verbal cues;Requires tactile cues;Decreased initiation;Slow processing General Comments: A&Ox1. "I am talking to my employer at my work" when asked where he was and then later in session, "[place]." "August" poor attention.      General Comments General comments (skin integrity, edema, etc.): HR up to124 bpm with activity,  Sp02 stable on 2L 02    Exercises     Assessment/Plan    PT Assessment Patient needs continued PT services  PT Problem List Decreased strength;Decreased mobility;Decreased safety awareness;Decreased range of motion;Decreased activity tolerance;Decreased cognition;Pain;Decreased balance       PT Treatment Interventions Therapeutic activities;Gait training;Therapeutic exercise;Patient/family education;Balance training;Functional mobility training;Cognitive remediation    PT Goals (Current goals can be found in the Care Plan section)  Acute Rehab PT Goals Patient Stated Goal: none stated PT Goal Formulation: Patient unable to participate in goal setting Time For Goal Achievement: 12/25/19 Potential to Achieve Goals: Fair    Frequency Min 2X/week   Barriers to discharge        Co-evaluation               AM-PAC PT "6 Clicks" Mobility  Outcome Measure Help needed turning from your back to your side while in a flat bed without using bedrails?: Total Help needed moving from lying on your back to sitting on the side of a flat bed without using bedrails?: Total Help needed moving to and from a bed to a chair (including a wheelchair)?: Total Help needed standing up from a chair using your arms (e.g., wheelchair or bedside chair)?: Total Help needed to walk in hospital room?: Total Help needed climbing 3-5 steps with a railing? : Total 6 Click Score: 6    End of Session Equipment Utilized During  Treatment: Oxygen Activity Tolerance: Patient limited by lethargy Patient left: in bed;with call bell/phone within reach;with bed alarm set Nurse Communication: Mobility status PT Visit Diagnosis: Pain;Muscle weakness (generalized) (M62.81) Pain - part of body: (neck)    Time: 1046-1100 PT Time Calculation (min) (ACUTE ONLY): 14 min   Charges:   PT Evaluation $PT Eval Moderate Complexity: 1 Mod          Marisa Severin, PT, DPT Acute Rehabilitation Services Pager [phone] Office [place] 12/11/2019, 3:18 PM

## 2019-12-11 NOTE — Progress Notes (Addendum)
PROGRESS NOTE    [name]  [id number] DOB: 1932/04/20 DOA: 12/10/2019 PCP: [name], MD     Brief Narrative:  86 year old man admitted from home on 3/4 with complaints of generalized weakness, lethargy, anorexia and abdominal pain for the past 2 weeks.  Per son he has had decreased urinary output but no fevers or chills.  His past medical history significant for a recent admission in December for sepsis secondary to acute cholecystitis status post percutaneous drainage by interventional radiology who has now completed antibiotic therapy, hypertension, stage III chronic kidney disease, right renal cell carcinoma status post right nephrectomy.  CT scan in the emergency department was concerning for left hydroureteronephrosis and signs of an upper UTI.  Admission was requested for further evaluation and management.   Assessment & Plan:   Principal Problem:   Enterococcal bacteremia Active Problems:   HTN (hypertension)   Anemia   CKD (chronic kidney disease)   Sepsis (HCC)   RA (rheumatoid arthritis) (HCC)   Atrial fibrillation with RVR (HCC)   Hydronephrosis of left kidney   AMS (altered mental status)   Depression   Cholecystitis, S/P drain 09-2019   Enterococcal sepsis -4/4 blood cultures with Enterococcus. -Antibiotics were switched from Rocephin to ampicillin. -Afebrile overnight, leukocytosis is improving. -Suspected source is urinary or gallbladder . -I have called urology, Dr. Jeffie Pollock, for consultation given mild left hydroureteronephrosis on CT scan.  However UA is negative. Urine cx pending. -He also had recent E. coli septicemia in December secondary to acute cholecystitis and still has his percutaneous drainage tube.  Will consult IR for them to continue to manage this while inpatient. -Addendum: Discussed with IR, tube appears to be positioned correctly and is draining, nothing further to do as of now. -General surgery has been consulted.  Per patient's son,  he is scheduled for a cholecystectomy in early April. -Given advanced age and multiple medical comorbidities, will also consult palliative care to formalize goals of care.  Acute metabolic encephalopathy -Likely due to sepsis. -Still very somnolent this a.m. -CT scan of the head is negative for CVA.  Atrial fibrillation with RVR -In the setting of sepsis, heart rate currently in the 110s. -He remains on a Cardizem drip. -Can consider transitioning to p.o. once his oral intake is improved. -He is not on anticoagulation at home despite high CHADSVASC score, suspect on account of age, frailty and deconditioning.  Recent history of cholecystitis with percutaneous drainage -Consult IR for drain management. -Follow LFTs.  Rheumatoid arthritis -At home on prednisone and Plaquenil. -No need for stress dose steroids as he is currently normo to hypertensive.  History of right renal cell carcinoma -Status post nephrectomy   DVT prophylaxis: Lovenox Code Status: Full code Family Communication: Discussed with son via phone Disposition Plan: To be determined  Consultants:   Urology  IR  Procedures:   None  Antimicrobials:  Anti-infectives (From admission, onward)   Start     Dose/Rate Route Frequency Ordered Stop   12/11/19 1200  cefTRIAXone (ROCEPHIN) 1 g in sodium chloride 0.9 % 100 mL IVPB  Status:  Discontinued     1 g 200 mL/hr over 30 Minutes Intravenous  Once 12/10/19 1831 12/11/19 0143   12/11/19 0800  ampicillin (OMNIPEN) 2 g in sodium chloride 0.9 % 100 mL IVPB     2 g 300 mL/hr over 20 Minutes Intravenous Every 4 hours 12/11/19 0143     12/11/19 0200  ampicillin (OMNIPEN) 2 g in sodium chloride 0.9 %  100 mL IVPB     2 g 300 mL/hr over 20 Minutes Intravenous NOW 12/11/19 0143 12/11/19 0401   12/10/19 1145  cefTRIAXone (ROCEPHIN) 2 g in sodium chloride 0.9 % 100 mL IVPB     2 g 200 mL/hr over 30 Minutes Intravenous  Once 12/10/19 1138 12/10/19 1240   12/10/19 1145   metroNIDAZOLE (FLAGYL) IVPB 500 mg     500 mg 100 mL/hr over 60 Minutes Intravenous  Once 12/10/19 1139 12/10/19 1254       Subjective: Lying in bed, very somnolent, minimally arousable.  Objective: Vitals:   12/11/19 0503 12/11/19 0632 12/11/19 0732 12/11/19 0745  BP:  123/77 129/74 133/75  Pulse:  (!) 110 (!) 117 (!) 103  Resp:   16 20  Temp:   98.7 F (37.1 C) 99.1 F (37.3 C)  TempSrc:   Oral Oral  SpO2:  100% 100% 100%  Weight: 73.4 kg     Height:        Intake/Output Summary (Last 24 hours) at 12/11/2019 1137 Last data filed at 12/11/2019 0901 Gross per 24 hour  Intake 1658.3 ml  Output 1000 ml  Net 658.3 ml   Filed Weights   12/10/19 1140 12/11/19 0503  Weight: 72.6 kg 73.4 kg    Examination:  General exam: Somnolent Respiratory system: Clear to auscultation. Respiratory effort normal. Cardiovascular system:RRR. No murmurs, rubs, gallops. Gastrointestinal system: Abdomen is nondistended, soft and nontender. No organomegaly or masses felt. Normal bowel sounds heard. Central nervous system: Difficult to assess given mental status Extremities: No C/C/E, +pedal pulses Psychiatry: Unable to fully assess given current mental status    Data Reviewed: I have personally reviewed following labs and imaging studies  CBC: Recent Labs  Lab 12/10/19 1138 12/10/19 1821 12/11/19 0330  WBC 18.6* 18.2* 15.9*  NEUTROABS 16.1*  --   --   HGB 11.8* 11.1* 10.4*  HCT 37.9* 36.6* 33.9*  MCV 84.4 84.9 84.8  PLT 379 343 [id number]   Basic Metabolic Panel: Recent Labs  Lab 12/10/19 1138 12/10/19 1821 12/11/19 0330  NA 132*  --  138  K 4.3  --  4.1  CL 101  --  109  CO2 17*  --  17*  GLUCOSE 153*  --  125*  BUN 47*  --  34*  CREATININE 1.44* 1.24 1.02  CALCIUM 8.8*  --  8.3*  MG  --  2.3  --   PHOS  --  3.7  --    GFR: Estimated Creatinine Clearance: 52.9 mL/min (by C-G formula based on SCr of 1.02 mg/dL). Liver Function Tests: Recent Labs  Lab 12/10/19 1138  12/11/19 0330  AST 63* 41  ALT 75* 51*  ALKPHOS 126 140*  BILITOT 1.1 1.0  PROT 7.0 5.7*  ALBUMIN 2.6* 2.0*   No results for input(s): LIPASE, AMYLASE in the last 168 hours. No results for input(s): AMMONIA in the last 168 hours. Coagulation Profile: Recent Labs  Lab 12/10/19 1138  INR 1.2   Cardiac Enzymes: No results for input(s): CKTOTAL, CKMB, CKMBINDEX, TROPONINI in the last 168 hours. BNP (last 3 results) No results for input(s): PROBNP in the last 8760 hours. HbA1C: No results for input(s): HGBA1C in the last 72 hours. CBG: Recent Labs  Lab 12/11/19 0810  GLUCAP 105*   Lipid Profile: No results for input(s): CHOL, HDL, LDLCALC, TRIG, CHOLHDL, LDLDIRECT in the last 72 hours. Thyroid Function Tests: No results for input(s): TSH, T4TOTAL, FREET4, T3FREE, THYROIDAB in the last  72 hours. Anemia Panel: No results for input(s): VITAMINB12, FOLATE, FERRITIN, TIBC, IRON, RETICCTPCT in the last 72 hours. Urine analysis:    Component Value Date/Time   COLORURINE AMBER (A) 12/10/2019 1628   APPEARANCEUR HAZY (A) 12/10/2019 1628   LABSPEC 1.028 12/10/2019 1628   PHURINE 5.0 12/10/2019 1628   GLUCOSEU NEGATIVE 12/10/2019 1628   HGBUR NEGATIVE 12/10/2019 1628   BILIRUBINUR NEGATIVE 12/10/2019 1628   KETONESUR NEGATIVE 12/10/2019 1628   PROTEINUR 100 (A) 12/10/2019 1628   UROBILINOGEN 1.0 11/27/2013 0947   NITRITE NEGATIVE 12/10/2019 1628   LEUKOCYTESUR NEGATIVE 12/10/2019 1628   Sepsis Labs: @LABRCNTIP (procalcitonin:4,lacticidven:4)  ) Recent Results (from the past 240 hour(s))  Blood Culture (routine x 2)     Status: Abnormal (Preliminary result)   Collection Time: 12/10/19 11:30 AM   Specimen: BLOOD  Result Value Ref Range Status   Specimen Description BLOOD RIGHT ANTECUBITAL  Final   Special Requests   Final    BOTTLES DRAWN AEROBIC AND ANAEROBIC Blood Culture adequate volume   Culture  Setup Time   Final    IN BOTH AEROBIC AND ANAEROBIC BOTTLES CRITICAL  RESULT CALLED TO, READ BACK BY AND VERIFIED WITH: K AMEND PHARMD 12/11/19 0125 JDW GRAM POSITIVE COCCI    Culture (A)  Final    ENTEROCOCCUS FAECALIS CULTURE REINCUBATED FOR BETTER GROWTH Performed at [facility], [place] [address], [place], [place] [address]    Report Status PENDING  Incomplete  Blood Culture ID Panel (Reflexed)     Status: Abnormal   Collection Time: 12/10/19 11:30 AM  Result Value Ref Range Status   Enterococcus species DETECTED (A) NOT DETECTED Final    Comment: CRITICAL RESULT CALLED TO, READ BACK BY AND VERIFIED WITH: K AMEND PHARMD 12/11/19 0125 JDW    Vancomycin resistance NOT DETECTED NOT DETECTED Final   Listeria monocytogenes NOT DETECTED NOT DETECTED Final   Staphylococcus species NOT DETECTED NOT DETECTED Final   Staphylococcus aureus (BCID) NOT DETECTED NOT DETECTED Final   Streptococcus species NOT DETECTED NOT DETECTED Final   Streptococcus agalactiae NOT DETECTED NOT DETECTED Final   Streptococcus pneumoniae NOT DETECTED NOT DETECTED Final   Streptococcus pyogenes NOT DETECTED NOT DETECTED Final   Acinetobacter baumannii NOT DETECTED NOT DETECTED Final   Enterobacteriaceae species NOT DETECTED NOT DETECTED Final   Enterobacter cloacae complex NOT DETECTED NOT DETECTED Final   Escherichia coli NOT DETECTED NOT DETECTED Final   Klebsiella oxytoca NOT DETECTED NOT DETECTED Final   Klebsiella pneumoniae NOT DETECTED NOT DETECTED Final   Proteus species NOT DETECTED NOT DETECTED Final   Serratia marcescens NOT DETECTED NOT DETECTED Final   Haemophilus influenzae NOT DETECTED NOT DETECTED Final   Neisseria meningitidis NOT DETECTED NOT DETECTED Final   Pseudomonas aeruginosa NOT DETECTED NOT DETECTED Final   Candida albicans NOT DETECTED NOT DETECTED Final   Candida glabrata NOT DETECTED NOT DETECTED Final   Candida krusei NOT DETECTED NOT DETECTED Final   Candida parapsilosis NOT DETECTED NOT DETECTED Final   Candida tropicalis NOT DETECTED NOT  DETECTED Final    Comment: Performed at [facility], [place]. [address], [place], [place] [address]  Blood Culture (routine x 2)     Status: None (Preliminary result)   Collection Time: 12/10/19 11:37 AM   Specimen: BLOOD LEFT HAND  Result Value Ref Range Status   Specimen Description BLOOD LEFT HAND  Final   Special Requests   Final    BOTTLES DRAWN AEROBIC AND ANAEROBIC Blood  Culture adequate volume   Culture  Setup Time   Final    IN BOTH AEROBIC AND ANAEROBIC BOTTLES GRAM POSITIVE COCCI IN CHAINS CRITICAL VALUE NOTED.  VALUE IS CONSISTENT WITH PREVIOUSLY REPORTED AND CALLED VALUE.    Culture   Final    GRAM POSITIVE COCCI CULTURE REINCUBATED FOR BETTER GROWTH Performed at [facility], [place] [address] [place], [place] [address]    Report Status PENDING  Incomplete  Urine culture     Status: None (Preliminary result)   Collection Time: 12/10/19  1:50 PM   Specimen: In/Out Cath Urine  Result Value Ref Range Status   Specimen Description IN/OUT CATH URINE  Final   Special Requests NONE  Final   Culture   Final    CULTURE REINCUBATED FOR BETTER GROWTH Performed at [facility], [place] [address] [place], [place] [address]    Report Status PENDING  Incomplete  SARS CORONAVIRUS 2 (TAT 6-24 HRS) Nasopharyngeal Nasopharyngeal Swab     Status: None   Collection Time: 12/10/19  7:02 PM   Specimen: Nasopharyngeal Swab  Result Value Ref Range Status   SARS Coronavirus 2 NEGATIVE NEGATIVE Final    Comment: (NOTE) SARS-CoV-2 target nucleic acids are NOT DETECTED. The SARS-CoV-2 RNA is generally detectable in upper and lower respiratory specimens during the acute phase of infection. Negative results do not preclude SARS-CoV-2 infection, do not rule out co-infections with other pathogens, and should not be used as the sole basis for treatment or other patient management decisions. Negative results must be combined with clinical observations, patient history, and  epidemiological information. The expected result is Negative. Fact Sheet for Patients: SugarRoll.be Fact Sheet for Healthcare Providers: https://www.woods-mathews.com/ This test is not yet approved or cleared by the [place] FDA and  has been authorized for detection and/or diagnosis of SARS-CoV-2 by FDA under an Emergency Use Authorization (EUA). This EUA will remain  in effect (meaning this test can be used) for the duration of the COVID-19 declaration under Section 56 4(b)(1) of the Act, 21 U.S.C. section 360bbb-3(b)(1), unless the authorization is terminated or revoked sooner. Performed at [facility], [place] [address], [place], [place] [address]          Radiology Studies: CT Head Wo Contrast  Result Date: 12/10/2019 CLINICAL DATA:  Lethargy.  Altered mental status. EXAM: CT HEAD WITHOUT CONTRAST TECHNIQUE: Contiguous axial images were obtained from the base of the skull through the vertex without intravenous contrast. COMPARISON:  September 17, 2019. FINDINGS: Brain: No acute intracranial hemorrhage, midline shift or edema. Stable interval appearance of asymmetrically prominent CSF-density in the left anterior parietal lobe sulcus compared to December 2020, possibly an underlying 2-3 cm arachnoid cyst or prominent sulcus secondary to adjacent cerebral atrophy. Extensive white matter disease in the bilateral corona radiata and chronic left lacunar infarction. Stable ventriculomegaly proportionate to cerebral volume loss without hydrocephalus. Backspace Vascular: Circumferential calcified atherosclerosis of a dominant left vertebral artery and the bilateral carotid siphons. Skull: Diffuse bone demineralization. Stable 8 mm calvarial benign hemangioma in the right parietal bone. Diffuse skull demineralization. Sinuses/Orbits: Bilateral scleral banding. Mild bilateral ethmoidal mucosal thickening. Secretions in the nasal airway. Other:  Chronic sclerosis in the dependent left mastoid air cells. No mastoid fluid bilaterally. IMPRESSION: No acute intracranial hemorrhage or edema. Redemonstration of extensive bilateral corona radiata white matter disease and stable interval appearance of prominent CSF-density in the left anterior parietal sulcus compared to December 2020. Electronically Signed   By:  Revonda Humphrey   On: 12/10/2019 17:18   CT ABDOMEN PELVIS W CONTRAST  Result Date: 12/10/2019 CLINICAL DATA:  Sepsis. Lethargy. Recent gallbladder infection 6 weeks ago. EXAM: CT ABDOMEN AND PELVIS WITH CONTRAST TECHNIQUE: Multidetector CT imaging of the abdomen and pelvis was performed using the standard protocol following bolus administration of intravenous contrast. CONTRAST:  48mL OMNIPAQUE IOHEXOL 300 MG/ML  SOLN COMPARISON:  September 16, 2019. FINDINGS: Lower chest: Mild cardiomegaly. Coarse aortic valvular calcification. Coronary calcification. Minimal subpleural atelectasis in the lung bases. Hepatobiliary: A cholecystostomy pigtail drainage catheter coiled in a borderline dilated 4.2 cm gallbladder lumen. No gallbladder wall thickening or calcified gallstone. No intrahepatic biliary duct dilatation or intrahepatic abscess collection. Patient motion artifact without apparent dilatation of the common bile duct. Normally sized liver with smooth hepatic contours and no ascites. No para cholecystic fluid or abscess or biloma. Pancreas: Mild pancreatic atrophy. No dilatation of the main pancreatic duct. No sequela of acute or chronic pancreatitis. Spleen: Normal. Adrenals/Urinary Tract: Normal bilateral adrenal glands. Mild left hydroureteronephrosis to the level of the UVJ and abnormal enhancement of the proximal and mid ureteral uroepithelium with moderate perinephric and periureteral inflammatory change. No ureteral calcified calculus. Remote right nephrectomy. A punctate left renal upper pole nonobstructing calculus. Marked urine distension of the  urinary bladder. Stomach/Bowel: Mild nonspecific thickening of the gastric fundus mucosa with nondistention of the gastric lumen in this region and unusual invaginated appearance of the posterior fundal mucosa, which could be secondary to physiologic contraction or an underlying gastric mucosal lesion. The mucosa had a normal appearance in this region on the December 2020 study. Nonobstructed bowel. Small stool burden. No apparent bowel wall thickening. Normal appendix on axial series 3, image 46. Vascular/Lymphatic: Thoracoabdominal aorta calcified atherosclerosis with borderline 3.0 cm fusiform dilatation of the proximal and mid abdominal aorta. No adenopathy. Reproductive: Enlarged 6 cm prostate with mass effect on the posterior urinary bladder. Septated left hydrocele measuring 10 x 5 by 8 cm. Other: Small periumbilical herniation of fat without strangulation or incarceration. Symmetrical sizes of the bilateral iliac psoas musculature, including along the right nephrectomy bed. Musculoskeletal: A 9 mm lytic lesion in the L1 left posterior vertebral body and relative lucency in the L4 left pedicle and bilateral L5 pedicles, unchanged. Diffuse bone demineralization and advanced skeletal degenerative changes. Lower lumbar posterior decompression and advanced bulky facet arthropathy. Healed right ninth, a, 7 rib fracture deformities. Chronic grade 1 spondylolisthesis of the lumbar spine, likely degenerative. IMPRESSION: Mild left hydroureteronephrosis to the level of the UVJ without an obstructing calcified ureteral calculus. Additional findings concerning for possible left upper urinary tract infection possible urinary outlet obstruction by an enlarged prostate. Status post right nephrectomy without adenopathy. Stable 9 mm L1 lytic lesion and relative lucency in the L4 left pedicle and bilateral L5 pedicles; MR lumbar spine without and with intravenous contrast could differentiate a benign from metastatic etiology  as clinically appropriate. Thoracoabdominal aortic calcified atherosclerosis and 3.0 cm fusiform dilatation of the abdominal aorta. Recommend followup by ultrasound in 3 years. This recommendation follows ACR consensus guidelines: White Paper of the ACR Incidental Findings Committee II on Vascular Findings. J Am Coll Radiol 2013; 10:789-794. Aortic aneurysm NOS (ICD10-I71.9) Mild nonspecific gastritis in the fundal region which is nondistended and has an unusual invaginated appearance that could be secondary to physiologic contraction or interval development of an underlying gastric mucosal lesion since December 2020. Upper gastrointestinal barium fluoroscopy could be considered. Mild cardiomegaly with coarse aortic valvular calcification. Large  left septated hydrocele. Residual mild gallbladder dilatation with appropriate placement of a cholecystostomy tube and no complicating features. Electronically Signed   By: Revonda Humphrey   On: 12/10/2019 16:56   DG Chest Port 1 View  Result Date: 12/10/2019 CLINICAL DATA:  Sepsis, weakness EXAM: PORTABLE CHEST 1 VIEW COMPARISON:  09/16/2019 FINDINGS: The heart size and mediastinal contours are within normal limits. Calcific aortic knob. No focal airspace consolidation, pleural effusion, or pneumothorax. Degenerative changes of the bilateral shoulders. IMPRESSION: No active disease. Electronically Signed   By: Davina Poke D.O.   On: 12/10/2019 12:15        Scheduled Meds: . Chlorhexidine Gluconate Cloth  6 each Topical Daily  . enoxaparin (LOVENOX) injection  40 mg Subcutaneous Q24H   Continuous Infusions: . sodium chloride 125 mL/hr at 12/11/19 0400  . ampicillin (OMNIPEN) IV 2 g (12/11/19 0755)  . diltiazem (CARDIZEM) infusion 5 mg/hr (12/10/19 2147)     LOS: 1 day    Time spent: 45 minutes. Greater than 50% of this time was spent in direct contact with the patient, coordinating care and discussing relevant ongoing clinical issues.    Lelon Frohlich, MD [facility] Pager [phone]  If 7PM-7AM, please contact night-coverage [email or web address] Password [facility] 12/11/2019, 11:37 AM

## 2019-12-11 NOTE — Progress Notes (Signed)
PHARMACY - PHYSICIAN COMMUNICATION CRITICAL VALUE ALERT - BLOOD CULTURE IDENTIFICATION (BCID)  Derrick [name] is an 86 y.o. male who presented to [facility] on 12/10/2019 with a chief complaint of generalized weakness  Assessment:  Pt with enterococcus (no vanc resistance detected) in 4/4 blood culture bottles  Name of physician (or Provider) Contacted: Arby Barrette, NP  Current antibiotics: Rocephin  Changes to prescribed antibiotics recommended:  D/c Rocephin  Begin ampicillin 2gm IV q4h ID will be auto consulted  Results for orders placed or performed during the hospital encounter of 12/10/19  Blood Culture ID Panel (Reflexed) (Collected: 12/10/2019 11:30 AM)  Result Value Ref Range   Enterococcus species DETECTED (A) NOT DETECTED   Vancomycin resistance NOT DETECTED NOT DETECTED   Listeria monocytogenes NOT DETECTED NOT DETECTED   Staphylococcus species NOT DETECTED NOT DETECTED   Staphylococcus aureus (BCID) NOT DETECTED NOT DETECTED   Streptococcus species NOT DETECTED NOT DETECTED   Streptococcus agalactiae NOT DETECTED NOT DETECTED   Streptococcus pneumoniae NOT DETECTED NOT DETECTED   Streptococcus pyogenes NOT DETECTED NOT DETECTED   Acinetobacter baumannii NOT DETECTED NOT DETECTED   Enterobacteriaceae species NOT DETECTED NOT DETECTED   Enterobacter cloacae complex NOT DETECTED NOT DETECTED   Escherichia coli NOT DETECTED NOT DETECTED   Klebsiella oxytoca NOT DETECTED NOT DETECTED   Klebsiella pneumoniae NOT DETECTED NOT DETECTED   Proteus species NOT DETECTED NOT DETECTED   Serratia marcescens NOT DETECTED NOT DETECTED   Haemophilus influenzae NOT DETECTED NOT DETECTED   Neisseria meningitidis NOT DETECTED NOT DETECTED   Pseudomonas aeruginosa NOT DETECTED NOT DETECTED   Candida albicans NOT DETECTED NOT DETECTED   Candida glabrata NOT DETECTED NOT DETECTED   Candida krusei NOT DETECTED NOT DETECTED   Candida parapsilosis NOT DETECTED NOT DETECTED   Candida  tropicalis NOT DETECTED NOT DETECTED    Sherlon Handing, PharmD, BCPS Please see amion for complete clinical pharmacist phone list 12/11/2019  1:44 AM

## 2019-12-11 NOTE — Plan of Care (Signed)
Alert and oriented x1. Pt is unable to communicate needs. Skin dry and warm. Call bell within reach. No respiratory distress is noted at this moment. Will monitor closely

## 2019-12-12 ENCOUNTER — Ambulatory Visit: Payer: Medicare Other

## 2019-12-12 LAB — CBC
HCT: 29.9 % — ABNORMAL LOW (ref 39.0–52.0)
Hemoglobin: 9 g/dL — ABNORMAL LOW (ref 13.0–17.0)
MCH: 25.9 pg — ABNORMAL LOW (ref 26.0–34.0)
MCHC: 30.1 g/dL (ref 30.0–36.0)
MCV: 86.2 fL (ref 80.0–100.0)
Platelets: 346 10*3/uL (ref 150–400)
RBC: 3.47 MIL/uL — ABNORMAL LOW (ref 4.22–5.81)
RDW: 23.1 % — ABNORMAL HIGH (ref 11.5–15.5)
WBC: 11.5 10*3/uL — ABNORMAL HIGH (ref 4.0–10.5)
nRBC: 0 % (ref 0.0–0.2)

## 2019-12-12 LAB — COMPREHENSIVE METABOLIC PANEL
ALT: 40 U/L (ref 0–44)
AST: 32 U/L (ref 15–41)
Albumin: 1.8 g/dL — ABNORMAL LOW (ref 3.5–5.0)
Alkaline Phosphatase: 101 U/L (ref 38–126)
Anion gap: 10 (ref 5–15)
BUN: 25 mg/dL — ABNORMAL HIGH (ref 8–23)
CO2: 15 mmol/L — ABNORMAL LOW (ref 22–32)
Calcium: 8.2 mg/dL — ABNORMAL LOW (ref 8.9–10.3)
Chloride: 116 mmol/L — ABNORMAL HIGH (ref 98–111)
Creatinine, Ser: 0.97 mg/dL (ref 0.61–1.24)
GFR calc Af Amer: >60 mL/min (ref >60)
GFR calc non Af Amer: >60 mL/min (ref >60)
Glucose, Bld: 118 mg/dL — ABNORMAL HIGH (ref 70–99)
Potassium: 3.7 mmol/L (ref 3.5–5.1)
Sodium: 141 mmol/L (ref 135–145)
Total Bilirubin: 0.6 mg/dL (ref 0.3–1.2)
Total Protein: 5.3 g/dL — ABNORMAL LOW (ref 6.5–8.1)

## 2019-12-12 LAB — URINE CULTURE: Culture: >=100000 — AB

## 2019-12-12 NOTE — Progress Notes (Signed)
[facility]    Date of Admission:  12/10/2019   Total days of antibiotics 3/day 2 amp           ID: Derrick [name] is a 86 y.o. male with generalized weakness, lethargy, anorexia and abdominal pain for the past 2 weeks.he has hx of prostate CA, CKD#, RCCa s/p right nephrectomy, and recently in December had acute cholecystitis status post percutaneous drainage. His work up revealed left hydroureteronephrosis with enterococcal bacteremia. Principal Problem:   Enterococcal bacteremia Active Problems:   HTN (hypertension)   Anemia   CKD (chronic kidney disease)   Sepsis (HCC)   RA (rheumatoid arthritis) (HCC)   Atrial fibrillation with RVR (HCC)   Hydronephrosis of left kidney   AMS (altered mental status)   Depression   Cholecystitis, S/P drain 09-2019   Goals of care, counseling/discussion   Palliative care by specialist    Subjective: Afebrile, but sleepy, does not respond to questions.  He underwent TTE that showed evidence of valve disease but not over vegetation. He was seen by urology who recommend repeat renal U/S tomorrow to ensure that hydronephrosis is improving. Also being seen by palliative care team to help family with overall goals of care.  Medications:   Chlorhexidine Gluconate Cloth  6 each Topical Daily   enoxaparin (LOVENOX) injection  40 mg Subcutaneous Q24H    Objective: Vital signs in last 24 hours: Temp:  [97.4 F (36.3 C)-99.6 F (37.6 C)] 97.5 F (36.4 C) (03/06 1139) Pulse Rate:  [81-123] 96 (03/06 1139) Resp:  [16-20] 18 (03/06 1139) BP: (114-137)/(68-88) 131/83 (03/06 1139) SpO2:  [87 %-100 %] 87 % (03/06 1139) Weight:  [75.4 kg] 75.4 kg (03/06 0520) Physical Exam  Constitutional: He is oriented to person, only. He appears chronically ill. No distress.  HENT:  Mouth/Throat: Oropharynx is clear and moist. No oropharyngeal exudate.  Cardiovascular: Normal rate, regular rhythm and normal heart sounds. Exam reveals no  gallop and no friction rub.  No murmur heard.  Pulmonary/Chest: Effort normal and breath sounds normal. No respiratory distress. He has no wheezes.  Abdominal: Soft. Bowel sounds are normal. Perc drain in place    Lab Results Recent Labs    12/11/19 0330 12/12/19 0514  WBC 15.9* 11.5*  HGB 10.4* 9.0*  HCT 33.9* 29.9*  NA 138 141  K 4.1 3.7  CL 109 116*  CO2 17* 15*  BUN 34* 25*  CREATININE 1.02 0.97   Liver Panel Recent Labs    12/11/19 0330 12/12/19 0514  PROT 5.7* 5.3*  ALBUMIN 2.0* 1.8*  AST 41 32  ALT 51* 40  ALKPHOS 140* 101  BILITOT 1.0 0.6    Microbiology: 3/4 blood cx e.faecalis 3/5 blood cx PENDING 3/4 urine cx: IN/OUT CATH URINE    Special Requests NONE  Performed at [facility], [place] [address] [place], [place] [address]   Culture >=100,000 COLONIES/mL ENTEROCOCCUS FAECALISAbnormal    Report Status 12/12/2019 FINAL   Organism ID, Bacteria ENTEROCOCCUS FAECALISAbnormal    Resulting Agency [facility]  Susceptibility   Enterococcus faecalis    MIC    AMPICILLIN <=2 SENSITIVE  Sensitive    NITROFURANTOIN <=16 SENSIT... Sensitive    VANCOMYCIN 1 SENSITIVE  Sensitive       Studies/Results: CT Head Wo Contrast  Result Date: 12/10/2019 CLINICAL DATA:  Lethargy.  Altered mental status. EXAM: CT HEAD WITHOUT CONTRAST TECHNIQUE: Contiguous axial images were obtained from the base of the  skull through the vertex without intravenous contrast. COMPARISON:  September 17, 2019. FINDINGS: Brain: No acute intracranial hemorrhage, midline shift or edema. Stable interval appearance of asymmetrically prominent CSF-density in the left anterior parietal lobe sulcus compared to December 2020, possibly an underlying 2-3 cm arachnoid cyst or prominent sulcus secondary to adjacent cerebral atrophy. Extensive white matter disease in the bilateral corona radiata and chronic left lacunar infarction. Stable ventriculomegaly proportionate to cerebral volume loss without  hydrocephalus. Backspace Vascular: Circumferential calcified atherosclerosis of a dominant left vertebral artery and the bilateral carotid siphons. Skull: Diffuse bone demineralization. Stable 8 mm calvarial benign hemangioma in the right parietal bone. Diffuse skull demineralization. Sinuses/Orbits: Bilateral scleral banding. Mild bilateral ethmoidal mucosal thickening. Secretions in the nasal airway. Other: Chronic sclerosis in the dependent left mastoid air cells. No mastoid fluid bilaterally. IMPRESSION: No acute intracranial hemorrhage or edema. Redemonstration of extensive bilateral corona radiata white matter disease and stable interval appearance of prominent CSF-density in the left anterior parietal sulcus compared to December 2020. Electronically Signed   By: Revonda Humphrey   On: 12/10/2019 17:18   CT ABDOMEN PELVIS W CONTRAST  Result Date: 12/10/2019 CLINICAL DATA:  Sepsis. Lethargy. Recent gallbladder infection 6 weeks ago. EXAM: CT ABDOMEN AND PELVIS WITH CONTRAST TECHNIQUE: Multidetector CT imaging of the abdomen and pelvis was performed using the standard protocol following bolus administration of intravenous contrast. CONTRAST:  11mL OMNIPAQUE IOHEXOL 300 MG/ML  SOLN COMPARISON:  September 16, 2019. FINDINGS: Lower chest: Mild cardiomegaly. Coarse aortic valvular calcification. Coronary calcification. Minimal subpleural atelectasis in the lung bases. Hepatobiliary: A cholecystostomy pigtail drainage catheter coiled in a borderline dilated 4.2 cm gallbladder lumen. No gallbladder wall thickening or calcified gallstone. No intrahepatic biliary duct dilatation or intrahepatic abscess collection. Patient motion artifact without apparent dilatation of the common bile duct. Normally sized liver with smooth hepatic contours and no ascites. No para cholecystic fluid or abscess or biloma. Pancreas: Mild pancreatic atrophy. No dilatation of the main pancreatic duct. No sequela of acute or chronic pancreatitis.  Spleen: Normal. Adrenals/Urinary Tract: Normal bilateral adrenal glands. Mild left hydroureteronephrosis to the level of the UVJ and abnormal enhancement of the proximal and mid ureteral uroepithelium with moderate perinephric and periureteral inflammatory change. No ureteral calcified calculus. Remote right nephrectomy. A punctate left renal upper pole nonobstructing calculus. Marked urine distension of the urinary bladder. Stomach/Bowel: Mild nonspecific thickening of the gastric fundus mucosa with nondistention of the gastric lumen in this region and unusual invaginated appearance of the posterior fundal mucosa, which could be secondary to physiologic contraction or an underlying gastric mucosal lesion. The mucosa had a normal appearance in this region on the December 2020 study. Nonobstructed bowel. Small stool burden. No apparent bowel wall thickening. Normal appendix on axial series 3, image 46. Vascular/Lymphatic: Thoracoabdominal aorta calcified atherosclerosis with borderline 3.0 cm fusiform dilatation of the proximal and mid abdominal aorta. No adenopathy. Reproductive: Enlarged 6 cm prostate with mass effect on the posterior urinary bladder. Septated left hydrocele measuring 10 x 5 by 8 cm. Other: Small periumbilical herniation of fat without strangulation or incarceration. Symmetrical sizes of the bilateral iliac psoas musculature, including along the right nephrectomy bed. Musculoskeletal: A 9 mm lytic lesion in the L1 left posterior vertebral body and relative lucency in the L4 left pedicle and bilateral L5 pedicles, unchanged. Diffuse bone demineralization and advanced skeletal degenerative changes. Lower lumbar posterior decompression and advanced bulky facet arthropathy. Healed right ninth, a, 7 rib fracture deformities. Chronic grade 1 spondylolisthesis of the  lumbar spine, likely degenerative. IMPRESSION: Mild left hydroureteronephrosis to the level of the UVJ without an obstructing calcified  ureteral calculus. Additional findings concerning for possible left upper urinary tract infection possible urinary outlet obstruction by an enlarged prostate. Status post right nephrectomy without adenopathy. Stable 9 mm L1 lytic lesion and relative lucency in the L4 left pedicle and bilateral L5 pedicles; MR lumbar spine without and with intravenous contrast could differentiate a benign from metastatic etiology as clinically appropriate. Thoracoabdominal aortic calcified atherosclerosis and 3.0 cm fusiform dilatation of the abdominal aorta. Recommend followup by ultrasound in 3 years. This recommendation follows ACR consensus guidelines: White Paper of the ACR Incidental Findings Committee II on Vascular Findings. J Am Coll Radiol 2013; 10:789-794. Aortic aneurysm NOS (ICD10-I71.9) Mild nonspecific gastritis in the fundal region which is nondistended and has an unusual invaginated appearance that could be secondary to physiologic contraction or interval development of an underlying gastric mucosal lesion since December 2020. Upper gastrointestinal barium fluoroscopy could be considered. Mild cardiomegaly with coarse aortic valvular calcification. Large left septated hydrocele. Residual mild gallbladder dilatation with appropriate placement of a cholecystostomy tube and no complicating features. Electronically Signed   By: Revonda Humphrey   On: 12/10/2019 16:56   DG Chest Port 1 View  Result Date: 12/10/2019 CLINICAL DATA:  Sepsis, weakness EXAM: PORTABLE CHEST 1 VIEW COMPARISON:  09/16/2019 FINDINGS: The heart size and mediastinal contours are within normal limits. Calcific aortic knob. No focal airspace consolidation, pleural effusion, or pneumothorax. Degenerative changes of the bilateral shoulders. IMPRESSION: No active disease. Electronically Signed   By: Davina Poke D.O.   On: 12/10/2019 12:15   ECHOCARDIOGRAM COMPLETE  Result Date: 12/11/2019    ECHOCARDIOGRAM REPORT   Patient Name:   Derrick [name]  Date of Exam: 12/11/2019 Medical Rec #:  [id number]   Height:       69.0 in Accession #:    [id number]  Weight:       161.8 lb Date of Birth:  Mar 24, 1932  BSA:          1.888 m Patient Age:    89 years    BP:           133/75 mmHg Patient Gender: M           HR:           103 bpm. Exam Location:  Inpatient Procedure: 2D Echo Indications:    Bacteremia R78.81; Murmur R01.1  History:        Patient has prior history of Echocardiogram examinations, most                 recent 09/19/2019. CAD; Risk Factors:Hypertension.  Sonographer:    Mikki Santee RDCS (AE) Referring Phys: [place]  1. Left ventricular ejection fraction, by estimation, is 70 to 75%. The left ventricle has hyperdynamic function. The left ventricle has no regional wall motion abnormalities. There is mild left ventricular hypertrophy of the basal-septal segment. Left ventricular diastolic function could not be evaluated. Left ventricular diastolic function could not be evaluated.  2. Right ventricular systolic function is normal. The right ventricular size is normal. There is normal pulmonary artery systolic pressure.  3. Left atrial size was moderately dilated.  4. The mitral valve is normal in structure and function. Mild to moderate mitral valve regurgitation. The MR is very eccentic and directed anteriorly.  5. The aortic valve is tricuspid. Aortic valve regurgitation is not visualized. FINDINGS  Left Ventricle: Left  ventricular ejection fraction, by estimation, is 70 to 75%. The left ventricle has hyperdynamic function. The left ventricle has no regional wall motion abnormalities. The left ventricular internal cavity size was normal in size. There is mild left ventricular hypertrophy of the basal-septal segment. Left ventricular diastolic function could not be evaluated due to atrial fibrillation. Left ventricular diastolic function could not be evaluated. Right Ventricle: The right ventricular size is normal. No  increase in right ventricular wall thickness. Right ventricular systolic function is normal. There is normal pulmonary artery systolic pressure. The tricuspid regurgitant velocity is 2.35 m/s, and  with an assumed right atrial pressure of 3 mmHg, the estimated right ventricular systolic pressure is [id number] mmHg. Left Atrium: Left atrial size was moderately dilated. Right Atrium: Right atrial size was normal in size. Pericardium: There is no evidence of pericardial effusion. Mitral Valve: The mitral valve is normal in structure and function. Mild to moderate mitral valve regurgitation, with anteriorly-directed jet. Tricuspid Valve: The tricuspid valve is normal in structure. Tricuspid valve regurgitation is trivial. Aortic Valve: The aortic valve is tricuspid. Aortic valve regurgitation is not visualized. There is moderate calcification of the aortic valve. Pulmonic Valve: The pulmonic valve was not well visualized. Pulmonic valve regurgitation is trivial. Aorta: The aortic root is normal in size and structure. IAS/Shunts: The interatrial septum appears to be lipomatous. No atrial level shunt detected by color flow Doppler.  LEFT VENTRICLE PLAX 2D LVIDd:         3.50 cm LVIDs:         2.20 cm LV PW:         1.10 cm LV IVS:        1.10 cm LVOT diam:     2.10 cm LV SV:         61 LV SV Index:   32 LVOT Area:     3.46 cm  RIGHT VENTRICLE RV S prime:     13.60 cm/s TAPSE (M-mode): 1.5 cm LEFT ATRIUM             Index       RIGHT ATRIUM           Index LA diam:        3.10 cm 1.64 cm/m  RA Area:     14.90 cm LA Vol (A2C):   59.7 ml 31.62 ml/m RA Volume:   30.30 ml  16.05 ml/m LA Vol (A4C):   97.3 ml 51.54 ml/m LA Biplane Vol: 76.2 ml 40.36 ml/m  AORTIC VALVE LVOT Vmax:   107.00 cm/s LVOT Vmean:  64.200 cm/s LVOT VTI:    0.176 m  AORTA Ao Root diam: 3.70 cm TRICUSPID VALVE TR Peak grad:   22.1 mmHg TR Vmax:        235.00 cm/s  SHUNTS Systemic VTI:  0.18 m Systemic Diam: 2.10 cm Glori Bickers MD Electronically signed  by Glori Bickers MD Signature Date/Time: 12/11/2019/6:52:08 PM    Final      Assessment/Plan: Enterococcal bacteremia thought to be urinary source (since also + ur cx), patient appears to have had symptoms for a few weeks prior to admit, - if repeat blood cx +, would recommend to get TEE (if can be done safely) - plan to continue with ampicillin IV  -agree with urology to have U/S of left kidney to see if improvement with hydronephrosis  [facility] Cell: [phone] Pager: [phone]  12/12/2019, 11:49 AM

## 2019-12-12 NOTE — Progress Notes (Signed)
PROGRESS NOTE    Derrick [name]  [id number] DOB: [birth year] DOA: 12/10/2019 PCP: [name], MD     Brief Narrative:  86 year old man admitted from home on 3/4 with complaints of generalized weakness, lethargy, anorexia and abdominal pain for the past 2 weeks.  Per son he has had decreased urinary output but no fevers or chills.  His past medical history significant for a recent admission in December for sepsis secondary to acute cholecystitis status post percutaneous drainage by interventional radiology who has now completed antibiotic therapy, hypertension, stage III chronic kidney disease, right renal cell carcinoma status post right nephrectomy.  CT scan in the emergency department was concerning for left hydroureteronephrosis and signs of an upper UTI.  Admission was requested for further evaluation and management.   Assessment & Plan:   Principal Problem:   Enterococcal bacteremia Active Problems:   HTN (hypertension)   Anemia   CKD (chronic kidney disease)   Sepsis (HCC)   RA (rheumatoid arthritis) (HCC)   Atrial fibrillation with RVR (HCC)   Hydronephrosis of left kidney   AMS (altered mental status)   Depression   Cholecystitis, S/P drain 09-2019   Goals of care, counseling/discussion   Palliative care by specialist   Enterococcal sepsis -Thought urinary source as urine cx also positive. -Plan to continue IV ampicillin. -Appreciate ID following. -Afebrile overnight, leukocytosis continues to improve: 18.6-->18.2-->15.9-->11.5. -Urology is recommending repeat renal US in am to see if hydronephrosis improving after foley catheter placement. -2D ECHO without vegetations. -Repeat BX remain negative at 12 hours. If they turn positive may need to consider TEE to r/o endocarditis. -Given advanced age and multiple medical comorbidities, appreciate palliative care following as well.  Acute metabolic encephalopathy -Likely due to sepsis. -Still very somnolent this  a.m. -CT scan of the head is negative for CVA.  Atrial fibrillation with RVR -In the setting of sepsis, heart rate currently in the 90s. -He remains on a Cardizem drip. -Can consider transitioning to p.o. once his oral intake is improved. -He is not on anticoagulation at home despite high CHADSVASC score, suspect on account of age, frailty and deconditioning.  Recent history of cholecystitis with percutaneous drainage -Scheduled for OP cholecystectomy 4/5. -Appreciate surgical consultation. -Follow LFTs.  Rheumatoid arthritis -At home on prednisone and Plaquenil. -No need for stress dose steroids as he is currently normo to hypertensive.  History of right renal cell carcinoma -Status post nephrectomy  Anemia of Chronic Disease -Due to RA. -Hb has drifted down to 9.0 from 11.1 on admission, suspect acute component to be dilutional anemia. -Follow and transfuse if <7.0.   DVT prophylaxis: Lovenox Code Status: Full code Family Communication: Discussed with son via phone on 3/5. Disposition Plan: To be determined, but likely home.  Consultants:   Urology  IR  Procedures:   None  Antimicrobials:  Anti-infectives (From admission, onward)   Start     Dose/Rate Route Frequency Ordered Stop   12/11/19 1200  cefTRIAXone (ROCEPHIN) 1 g in sodium chloride 0.9 % 100 mL IVPB  Status:  Discontinued     1 g 200 mL/hr over 30 Minutes Intravenous  Once 12/10/19 1831 12/11/19 0143   12/11/19 0800  ampicillin (OMNIPEN) 2 g in sodium chloride 0.9 % 100 mL IVPB     2 g 300 mL/hr over 20 Minutes Intravenous Every 4 hours 12/11/19 0143     12/11/19 0200  ampicillin (OMNIPEN) 2 g in sodium chloride 0.9 % 100 mL IVPB     2  g 300 mL/hr over 20 Minutes Intravenous NOW 12/11/19 0143 12/11/19 0401   12/10/19 1145  cefTRIAXone (ROCEPHIN) 2 g in sodium chloride 0.9 % 100 mL IVPB     2 g 200 mL/hr over 30 Minutes Intravenous  Once 12/10/19 1138 12/10/19 1240   12/10/19 1145  metroNIDAZOLE  (FLAGYL) IVPB 500 mg     500 mg 100 mL/hr over 60 Minutes Intravenous  Once 12/10/19 1139 12/10/19 1254       Subjective: In bed, somnolent, opens eyes to voice.  Objective: Vitals:   12/12/19 0254 12/12/19 0520 12/12/19 0917 12/12/19 1139  BP: 136/81 114/78 128/79 131/83  Pulse: (!) 112 100 100 96  Resp:   18 18  Temp:  98.3 F (36.8 C)  (!) 97.5 F (36.4 C)  TempSrc:  Oral  Oral  SpO2: 100% 100% 100% (!) 87%  Weight:  75.4 kg    Height:        Intake/Output Summary (Last 24 hours) at 12/12/2019 1232 Last data filed at 12/12/2019 0900 Gross per 24 hour  Intake 3339.7 ml  Output 3800 ml  Net -460.3 ml   Filed Weights   12/10/19 1140 12/11/19 0503 12/12/19 0520  Weight: 72.6 kg 73.4 kg 75.4 kg    Examination:  General exam: Somnolent Respiratory system: Clear to auscultation. Respiratory effort normal. Cardiovascular system:RRR. No murmurs, rubs, gallops. Gastrointestinal system: Abdomen is nondistended, soft and nontender. No organomegaly or masses felt. Normal bowel sounds heard. Central nervous system: Difficult to assess given mental status Extremities: No C/C/E, +pedal pulses Psychiatry: Unable to fully assess given current mental status    Data Reviewed: I have personally reviewed following labs and imaging studies  CBC: Recent Labs  Lab 12/10/19 1138 12/10/19 1821 12/11/19 0330 12/12/19 0514  WBC 18.6* 18.2* 15.9* 11.5*  NEUTROABS 16.1*  --   --   --   HGB 11.8* 11.1* 10.4* 9.0*  HCT 37.9* 36.6* 33.9* 29.9*  MCV 84.4 84.9 84.8 86.2  PLT 379 343 331 [id number]   Basic Metabolic Panel: Recent Labs  Lab 12/10/19 1138 12/10/19 1821 12/11/19 0330 12/12/19 0514  NA 132*  --  138 141  K 4.3  --  4.1 3.7  CL 101  --  109 116*  CO2 17*  --  17* 15*  GLUCOSE 153*  --  125* 118*  BUN 47*  --  34* 25*  CREATININE 1.44* 1.24 1.02 0.97  CALCIUM 8.8*  --  8.3* 8.2*  MG  --  2.3  --   --   PHOS  --  3.7  --   --    GFR: Estimated Creatinine Clearance:  55.7 mL/min (by C-G formula based on SCr of 0.97 mg/dL). Liver Function Tests: Recent Labs  Lab 12/10/19 1138 12/11/19 0330 12/12/19 0514  AST 63* 41 32  ALT 75* 51* 40  ALKPHOS 126 140* 101  BILITOT 1.1 1.0 0.6  PROT 7.0 5.7* 5.3*  ALBUMIN 2.6* 2.0* 1.8*   No results for input(s): LIPASE, AMYLASE in the last 168 hours. No results for input(s): AMMONIA in the last 168 hours. Coagulation Profile: Recent Labs  Lab 12/10/19 1138  INR 1.2   Cardiac Enzymes: No results for input(s): CKTOTAL, CKMB, CKMBINDEX, TROPONINI in the last 168 hours. BNP (last 3 results) No results for input(s): PROBNP in the last 8760 hours. HbA1C: No results for input(s): HGBA1C in the last 72 hours. CBG: Recent Labs  Lab 12/11/19 0810  GLUCAP 105*   Lipid Profile:  No results for input(s): CHOL, HDL, LDLCALC, TRIG, CHOLHDL, LDLDIRECT in the last 72 hours. Thyroid Function Tests: No results for input(s): TSH, T4TOTAL, FREET4, T3FREE, THYROIDAB in the last 72 hours. Anemia Panel: No results for input(s): VITAMINB12, FOLATE, FERRITIN, TIBC, IRON, RETICCTPCT in the last 72 hours. Urine analysis:    Component Value Date/Time   COLORURINE AMBER (A) 12/10/2019 1628   APPEARANCEUR HAZY (A) 12/10/2019 1628   LABSPEC 1.028 12/10/2019 1628   PHURINE 5.0 12/10/2019 1628   GLUCOSEU NEGATIVE 12/10/2019 1628   HGBUR NEGATIVE 12/10/2019 1628   BILIRUBINUR NEGATIVE 12/10/2019 1628   KETONESUR NEGATIVE 12/10/2019 1628   PROTEINUR 100 (A) 12/10/2019 1628   UROBILINOGEN 1.0 11/27/2013 0947   NITRITE NEGATIVE 12/10/2019 1628   LEUKOCYTESUR NEGATIVE 12/10/2019 1628   Sepsis Labs: @LABRCNTIP (procalcitonin:4,lacticidven:4)  ) Recent Results (from the past 240 hour(s))  Blood Culture (routine x 2)     Status: Abnormal (Preliminary result)   Collection Time: 12/10/19 11:30 AM   Specimen: BLOOD  Result Value Ref Range Status   Specimen Description BLOOD RIGHT ANTECUBITAL  Final   Special Requests   Final     BOTTLES DRAWN AEROBIC AND ANAEROBIC Blood Culture adequate volume   Culture  Setup Time   Final    IN BOTH AEROBIC AND ANAEROBIC BOTTLES CRITICAL RESULT CALLED TO, READ BACK BY AND VERIFIED WITH: K AMEND PHARMD 12/11/19 0125 JDW GRAM POSITIVE COCCI    Culture (A)  Final    ENTEROCOCCUS FAECALIS SUSCEPTIBILITIES TO FOLLOW Performed at [facility], [place] [address], [place], [place] [address]    Report Status PENDING  Incomplete  Blood Culture ID Panel (Reflexed)     Status: Abnormal   Collection Time: 12/10/19 11:30 AM  Result Value Ref Range Status   Enterococcus species DETECTED (A) NOT DETECTED Final    Comment: CRITICAL RESULT CALLED TO, READ BACK BY AND VERIFIED WITH: K AMEND PHARMD 12/11/19 0125 JDW    Vancomycin resistance NOT DETECTED NOT DETECTED Final   Listeria monocytogenes NOT DETECTED NOT DETECTED Final   Staphylococcus species NOT DETECTED NOT DETECTED Final   Staphylococcus aureus (BCID) NOT DETECTED NOT DETECTED Final   Streptococcus species NOT DETECTED NOT DETECTED Final   Streptococcus agalactiae NOT DETECTED NOT DETECTED Final   Streptococcus pneumoniae NOT DETECTED NOT DETECTED Final   Streptococcus pyogenes NOT DETECTED NOT DETECTED Final   Acinetobacter baumannii NOT DETECTED NOT DETECTED Final   Enterobacteriaceae species NOT DETECTED NOT DETECTED Final   Enterobacter cloacae complex NOT DETECTED NOT DETECTED Final   Escherichia coli NOT DETECTED NOT DETECTED Final   Klebsiella oxytoca NOT DETECTED NOT DETECTED Final   Klebsiella pneumoniae NOT DETECTED NOT DETECTED Final   Proteus species NOT DETECTED NOT DETECTED Final   Serratia marcescens NOT DETECTED NOT DETECTED Final   Haemophilus influenzae NOT DETECTED NOT DETECTED Final   Neisseria meningitidis NOT DETECTED NOT DETECTED Final   Pseudomonas aeruginosa NOT DETECTED NOT DETECTED Final   Candida albicans NOT DETECTED NOT DETECTED Final   Candida glabrata NOT DETECTED NOT DETECTED Final   Candida  krusei NOT DETECTED NOT DETECTED Final   Candida parapsilosis NOT DETECTED NOT DETECTED Final   Candida tropicalis NOT DETECTED NOT DETECTED Final    Comment: Performed at [facility], [place] [address], [place], [place] [address]  Blood Culture (routine x 2)     Status: None (Preliminary result)   Collection Time: 12/10/19 11:37 AM   Specimen: BLOOD LEFT HAND  Result Value  Ref Range Status   Specimen Description BLOOD LEFT HAND  Final   Special Requests   Final    BOTTLES DRAWN AEROBIC AND ANAEROBIC Blood Culture adequate volume   Culture  Setup Time   Final    IN BOTH AEROBIC AND ANAEROBIC BOTTLES GRAM POSITIVE COCCI IN CHAINS CRITICAL VALUE NOTED.  VALUE IS CONSISTENT WITH PREVIOUSLY REPORTED AND CALLED VALUE. Performed at [facility], [place] [address], [place], [place] [address]    Culture GRAM POSITIVE COCCI  Final   Report Status PENDING  Incomplete  Urine culture     Status: Abnormal   Collection Time: 12/10/19  1:50 PM   Specimen: In/Out Cath Urine  Result Value Ref Range Status   Specimen Description IN/OUT CATH URINE  Final   Special Requests   Final    NONE Performed at [facility], [place] [address], [place], [place] [address]    Culture >=100,000 COLONIES/mL ENTEROCOCCUS FAECALIS (A)  Final   Report Status 12/12/2019 FINAL  Final   Organism ID, Bacteria ENTEROCOCCUS FAECALIS (A)  Final      Susceptibility   Enterococcus faecalis - MIC*    AMPICILLIN <=2 SENSITIVE Sensitive     NITROFURANTOIN <=16 SENSITIVE Sensitive     VANCOMYCIN 1 SENSITIVE Sensitive     * >=100,000 COLONIES/mL ENTEROCOCCUS FAECALIS  SARS CORONAVIRUS 2 (TAT 6-24 HRS) Nasopharyngeal Nasopharyngeal Swab     Status: None   Collection Time: 12/10/19  7:02 PM   Specimen: Nasopharyngeal Swab  Result Value Ref Range Status   SARS Coronavirus 2 NEGATIVE NEGATIVE Final    Comment: (NOTE) SARS-CoV-2 target nucleic acids are NOT DETECTED. The SARS-CoV-2 RNA is generally detectable in upper  and lower respiratory specimens during the acute phase of infection. Negative results do not preclude SARS-CoV-2 infection, do not rule out co-infections with other pathogens, and should not be used as the sole basis for treatment or other patient management decisions. Negative results must be combined with clinical observations, patient history, and epidemiological information. The expected result is Negative. Fact Sheet for Patients: SugarRoll.be Fact Sheet for Healthcare Providers: https://www.woods-mathews.com/ This test is not yet approved or cleared by the [place] FDA and  has been authorized for detection and/or diagnosis of SARS-CoV-2 by FDA under an Emergency Use Authorization (EUA). This EUA will remain  in effect (meaning this test can be used) for the duration of the COVID-19 declaration under Section 56 4(b)(1) of the Act, 21 U.S.C. section 360bbb-3(b)(1), unless the authorization is terminated or revoked sooner. Performed at [facility], [place] [address], [place], [place] [address]   Culture, blood (routine x 2)     Status: None (Preliminary result)   Collection Time: 12/12/19  5:16 AM   Specimen: BLOOD RIGHT HAND  Result Value Ref Range Status   Specimen Description BLOOD RIGHT HAND  Final   Special Requests   Final    BOTTLES DRAWN AEROBIC AND ANAEROBIC Blood Culture adequate volume   Culture   Final    NO GROWTH < 12 HOURS Performed at [facility], [place] [address] [place], [place] [address]    Report Status PENDING  Incomplete  Culture, blood (routine x 2)     Status: None (Preliminary result)   Collection Time: 12/12/19  5:20 AM   Specimen: BLOOD LEFT HAND  Result Value Ref Range Status   Specimen Description BLOOD LEFT HAND  Final   Special Requests   Final  BOTTLES DRAWN AEROBIC AND ANAEROBIC Blood Culture adequate volume   Culture   Final    NO GROWTH < 12 HOURS Performed at [place], [place] [address] [place], [place] [address]    Report Status PENDING  Incomplete         Radiology Studies: CT Head Wo Contrast  Result Date: 12/10/2019 CLINICAL DATA:  Lethargy.  Altered mental status. EXAM: CT HEAD WITHOUT CONTRAST TECHNIQUE: Contiguous axial images were obtained from the base of the skull through the vertex without intravenous contrast. COMPARISON:  September 17, 2019. FINDINGS: Brain: No acute intracranial hemorrhage, midline shift or edema. Stable interval appearance of asymmetrically prominent CSF-density in the left anterior parietal lobe sulcus compared to December 2020, possibly an underlying 2-3 cm arachnoid cyst or prominent sulcus secondary to adjacent cerebral atrophy. Extensive white matter disease in the bilateral corona radiata and chronic left lacunar infarction. Stable ventriculomegaly proportionate to cerebral volume loss without hydrocephalus. Backspace Vascular: Circumferential calcified atherosclerosis of a dominant left vertebral artery and the bilateral carotid siphons. Skull: Diffuse bone demineralization. Stable 8 mm calvarial benign hemangioma in the right parietal bone. Diffuse skull demineralization. Sinuses/Orbits: Bilateral scleral banding. Mild bilateral ethmoidal mucosal thickening. Secretions in the nasal airway. Other: Chronic sclerosis in the dependent left mastoid air cells. No mastoid fluid bilaterally. IMPRESSION: No acute intracranial hemorrhage or edema. Redemonstration of extensive bilateral corona radiata white matter disease and stable interval appearance of prominent CSF-density in the left anterior parietal sulcus compared to December 2020. Electronically Signed   By: Revonda [place]   On: 12/10/2019 17:18   CT ABDOMEN PELVIS W CONTRAST  Result Date: 12/10/2019 CLINICAL DATA:  Sepsis. Lethargy. Recent gallbladder infection 6 weeks ago. EXAM: CT ABDOMEN AND PELVIS WITH CONTRAST TECHNIQUE: Multidetector CT imaging of the abdomen and pelvis was  performed using the standard protocol following bolus administration of intravenous contrast. CONTRAST:  71mL OMNIPAQUE IOHEXOL 300 MG/ML  SOLN COMPARISON:  September 16, 2019. FINDINGS: Lower chest: Mild cardiomegaly. Coarse aortic valvular calcification. Coronary calcification. Minimal subpleural atelectasis in the lung bases. Hepatobiliary: A cholecystostomy pigtail drainage catheter coiled in a borderline dilated 4.2 cm gallbladder lumen. No gallbladder wall thickening or calcified gallstone. No intrahepatic biliary duct dilatation or intrahepatic abscess collection. Patient motion artifact without apparent dilatation of the common bile duct. Normally sized liver with smooth hepatic contours and no ascites. No para cholecystic fluid or abscess or biloma. Pancreas: Mild pancreatic atrophy. No dilatation of the main pancreatic duct. No sequela of acute or chronic pancreatitis. Spleen: Normal. Adrenals/Urinary Tract: Normal bilateral adrenal glands. Mild left hydroureteronephrosis to the level of the UVJ and abnormal enhancement of the proximal and mid ureteral uroepithelium with moderate perinephric and periureteral inflammatory change. No ureteral calcified calculus. Remote right nephrectomy. A punctate left renal upper pole nonobstructing calculus. Marked urine distension of the urinary bladder. Stomach/Bowel: Mild nonspecific thickening of the gastric fundus mucosa with nondistention of the gastric lumen in this region and unusual invaginated appearance of the posterior fundal mucosa, which could be secondary to physiologic contraction or an underlying gastric mucosal lesion. The mucosa had a normal appearance in this region on the December 2020 study. Nonobstructed bowel. Small stool burden. No apparent bowel wall thickening. Normal appendix on axial series 3, image 46. Vascular/Lymphatic: Thoracoabdominal aorta calcified atherosclerosis with borderline 3.0 cm fusiform dilatation of the proximal and mid  abdominal aorta. No adenopathy. Reproductive: Enlarged 6 cm prostate with mass effect on the posterior urinary bladder. Septated left hydrocele measuring  10 x 5 by 8 cm. Other: Small periumbilical herniation of fat without strangulation or incarceration. Symmetrical sizes of the bilateral iliac psoas musculature, including along the right nephrectomy bed. Musculoskeletal: A 9 mm lytic lesion in the L1 left posterior vertebral body and relative lucency in the L4 left pedicle and bilateral L5 pedicles, unchanged. Diffuse bone demineralization and advanced skeletal degenerative changes. Lower lumbar posterior decompression and advanced bulky facet arthropathy. Healed right ninth, a, 7 rib fracture deformities. Chronic grade 1 spondylolisthesis of the lumbar spine, likely degenerative. IMPRESSION: Mild left hydroureteronephrosis to the level of the UVJ without an obstructing calcified ureteral calculus. Additional findings concerning for possible left upper urinary tract infection possible urinary outlet obstruction by an enlarged prostate. Status post right nephrectomy without adenopathy. Stable 9 mm L1 lytic lesion and relative lucency in the L4 left pedicle and bilateral L5 pedicles; MR lumbar spine without and with intravenous contrast could differentiate a benign from metastatic etiology as clinically appropriate. Thoracoabdominal aortic calcified atherosclerosis and 3.0 cm fusiform dilatation of the abdominal aorta. Recommend followup by ultrasound in 3 years. This recommendation follows ACR consensus guidelines: White Paper of the ACR Incidental Findings Committee II on Vascular Findings. J Am Coll Radiol 2013; 10:789-794. Aortic aneurysm NOS (ICD10-I71.9) Mild nonspecific gastritis in the fundal region which is nondistended and has an unusual invaginated appearance that could be secondary to physiologic contraction or interval development of an underlying gastric mucosal lesion since December 2020. Upper  gastrointestinal barium fluoroscopy could be considered. Mild cardiomegaly with coarse aortic valvular calcification. Large left septated hydrocele. Residual mild gallbladder dilatation with appropriate placement of a cholecystostomy tube and no complicating features. Electronically Signed   By: Revonda [place]   On: 12/10/2019 16:56   ECHOCARDIOGRAM COMPLETE  Result Date: 12/11/2019    ECHOCARDIOGRAM REPORT   Patient Name:   ANIRUDDHA ROTHMEYER Date of Exam: 12/11/2019 Medical Rec #:  [id number]   Height:       69.0 in Accession #:    [id number]  Weight:       161.8 lb Date of Birth:  [birth year]  BSA:          1.888 m Patient Age:    59 years    BP:           133/75 mmHg Patient Gender: M           HR:           103 bpm. Exam Location:  Inpatient Procedure: 2D Echo Indications:    Bacteremia R78.81; Murmur R01.1  History:        Patient has prior history of Echocardiogram examinations, most                 recent 09/19/2019. CAD; Risk Factors:Hypertension.  Sonographer:    Mikki Santee RDCS (AE) Referring Phys: [place]  1. Left ventricular ejection fraction, by estimation, is 70 to 75%. The left ventricle has hyperdynamic function. The left ventricle has no regional wall motion abnormalities. There is mild left ventricular hypertrophy of the basal-septal segment. Left ventricular diastolic function could not be evaluated. Left ventricular diastolic function could not be evaluated.  2. Right ventricular systolic function is normal. The right ventricular size is normal. There is normal pulmonary artery systolic pressure.  3. Left atrial size was moderately dilated.  4. The mitral valve is normal in structure and function. Mild to moderate mitral valve regurgitation. The MR is very eccentic and directed anteriorly.  5.  The aortic valve is tricuspid. Aortic valve regurgitation is not visualized. FINDINGS  Left Ventricle: Left ventricular ejection fraction, by estimation, is 70 to 75%. The left  ventricle has hyperdynamic function. The left ventricle has no regional wall motion abnormalities. The left ventricular internal cavity size was normal in size. There is mild left ventricular hypertrophy of the basal-septal segment. Left ventricular diastolic function could not be evaluated due to atrial fibrillation. Left ventricular diastolic function could not be evaluated. Right Ventricle: The right ventricular size is normal. No increase in right ventricular wall thickness. Right ventricular systolic function is normal. There is normal pulmonary artery systolic pressure. The tricuspid regurgitant velocity is 2.35 m/s, and  with an assumed right atrial pressure of 3 mmHg, the estimated right ventricular systolic pressure is [id number] mmHg. Left Atrium: Left atrial size was moderately dilated. Right Atrium: Right atrial size was normal in size. Pericardium: There is no evidence of pericardial effusion. Mitral Valve: The mitral valve is normal in structure and function. Mild to moderate mitral valve regurgitation, with anteriorly-directed jet. Tricuspid Valve: The tricuspid valve is normal in structure. Tricuspid valve regurgitation is trivial. Aortic Valve: The aortic valve is tricuspid. Aortic valve regurgitation is not visualized. There is moderate calcification of the aortic valve. Pulmonic Valve: The pulmonic valve was not well visualized. Pulmonic valve regurgitation is trivial. Aorta: The aortic root is normal in size and structure. IAS/Shunts: The interatrial septum appears to be lipomatous. No atrial level shunt detected by color flow Doppler.  LEFT VENTRICLE PLAX 2D LVIDd:         3.50 cm LVIDs:         2.20 cm LV PW:         1.10 cm LV IVS:        1.10 cm LVOT diam:     2.10 cm LV SV:         61 LV SV Index:   32 LVOT Area:     3.46 cm  RIGHT VENTRICLE RV S prime:     13.60 cm/s TAPSE (M-mode): 1.5 cm LEFT ATRIUM             Index       RIGHT ATRIUM           Index LA diam:        3.10 cm 1.64 cm/m  RA  Area:     14.90 cm LA Vol (A2C):   59.7 ml 31.62 ml/m RA Volume:   30.30 ml  16.05 ml/m LA Vol (A4C):   97.3 ml 51.54 ml/m LA Biplane Vol: 76.2 ml 40.36 ml/m  AORTIC VALVE LVOT Vmax:   107.00 cm/s LVOT Vmean:  64.200 cm/s LVOT VTI:    0.176 m  AORTA Ao Root diam: 3.70 cm TRICUSPID VALVE TR Peak grad:   22.1 mmHg TR Vmax:        235.00 cm/s  SHUNTS Systemic VTI:  0.18 m Systemic Diam: 2.10 cm Glori Bickers MD Electronically signed by Glori Bickers MD Signature Date/Time: 12/11/2019/6:52:08 PM    Final         Scheduled Meds: . Chlorhexidine Gluconate Cloth  6 each Topical Daily  . enoxaparin (LOVENOX) injection  40 mg Subcutaneous Q24H   Continuous Infusions: . sodium chloride 125 mL/hr at 12/12/19 0358  . ampicillin (OMNIPEN) IV 2 g (12/12/19 0920)  . diltiazem (CARDIZEM) infusion 10 mg/hr (12/12/19 1034)     LOS: 2 days    Time spent: 25 minutes. Greater than 50% of  this time was spent in direct contact with the patient, coordinating care and discussing relevant ongoing clinical issues.    Lelon Frohlich, MD [facility] Pager [phone]  If 7PM-7AM, please contact night-coverage [email or web address] Password [facility] 12/12/2019, 12:32 PM

## 2019-12-13 ENCOUNTER — Inpatient Hospital Stay (HOSPITAL_COMMUNITY): Payer: Medicare Other

## 2019-12-13 LAB — BASIC METABOLIC PANEL
Anion gap: 12 (ref 5–15)
BUN: 18 mg/dL (ref 8–23)
CO2: 15 mmol/L — ABNORMAL LOW (ref 22–32)
Calcium: 8.1 mg/dL — ABNORMAL LOW (ref 8.9–10.3)
Chloride: 115 mmol/L — ABNORMAL HIGH (ref 98–111)
Creatinine, Ser: 0.8 mg/dL (ref 0.61–1.24)
GFR calc Af Amer: >60 mL/min (ref >60)
GFR calc non Af Amer: >60 mL/min (ref >60)
Glucose, Bld: 119 mg/dL — ABNORMAL HIGH (ref 70–99)
Potassium: 3.5 mmol/L (ref 3.5–5.1)
Sodium: 142 mmol/L (ref 135–145)

## 2019-12-13 LAB — CBC
HCT: 30.5 % — ABNORMAL LOW (ref 39.0–52.0)
Hemoglobin: 9.4 g/dL — ABNORMAL LOW (ref 13.0–17.0)
MCH: 26.2 pg (ref 26.0–34.0)
MCHC: 30.8 g/dL (ref 30.0–36.0)
MCV: 85 fL (ref 80.0–100.0)
Platelets: 349 10*3/uL (ref 150–400)
RBC: 3.59 MIL/uL — ABNORMAL LOW (ref 4.22–5.81)
RDW: 23 % — ABNORMAL HIGH (ref 11.5–15.5)
WBC: 10 10*3/uL (ref 4.0–10.5)
nRBC: 0 % (ref 0.0–0.2)

## 2019-12-13 LAB — CULTURE, BLOOD (ROUTINE X 2)
Special Requests: ADEQUATE
Special Requests: ADEQUATE

## 2019-12-13 NOTE — Progress Notes (Signed)
PROGRESS NOTE    Derrick [name]  [id number] DOB: 05-06-1932 DOA: 12/10/2019 PCP: [name], MD     Brief Narrative:  86 year old man admitted from home on 3/4 with complaints of generalized weakness, lethargy, anorexia and abdominal pain for the past 2 weeks.  Per son he has had decreased urinary output but no fevers or chills.  His past medical history significant for a recent admission in December for sepsis secondary to acute cholecystitis status post percutaneous drainage by interventional radiology who has now completed antibiotic therapy, hypertension, stage III chronic kidney disease, right renal cell carcinoma status post right nephrectomy.  CT scan in the emergency department was concerning for left hydroureteronephrosis and signs of an upper UTI.  Admission was requested for further evaluation and management.   Assessment & Plan:   Principal Problem:   Enterococcal bacteremia Active Problems:   HTN (hypertension)   Anemia   CKD (chronic kidney disease)   Sepsis (HCC)   RA (rheumatoid arthritis) (HCC)   Atrial fibrillation with RVR (HCC)   Hydronephrosis of left kidney   AMS (altered mental status)   Depression   Cholecystitis, S/P drain 09-2019   Goals of care, counseling/discussion   Palliative care by specialist   Enterococcal sepsis -Thought urinary source as urine cx also positive. -Plan to continue IV ampicillin. -Appreciate ID following. -Afebrile overnight, leukocytosis continues to improve: 18.6-->18.2-->15.9-->11.5-->10.0. -Urology is recommending repeat renal US 3/7: No residual hydroureteronephrosis, clot in the bladder lumen with distended bladder despite foley catheter. He has a h/o prostate cancer. GU is following; appreciate their input and recommendations. -2D ECHO without vegetations. -Repeat BX remain negative at 12 hours. If they turn positive may need to consider TEE to r/o endocarditis. -Given advanced age and multiple medical comorbidities,  appreciate palliative care following as well.  Acute metabolic encephalopathy -Likely due to sepsis. -A little more alert today. -CT scan of the head is negative for CVA.  Atrial fibrillation with RVR -In the setting of sepsis, heart rate currently in the 90s. -He remains on a Cardizem drip. -Can consider transitioning to p.o. once his oral intake is improved. -He is not on anticoagulation at home despite high CHADSVASC score, suspect on account of age, frailty and deconditioning.  Recent history of cholecystitis with percutaneous drainage -Scheduled for OP cholecystectomy 4/5. -Appreciate surgical consultation. -Follow LFTs.  Rheumatoid arthritis -At home on prednisone and Plaquenil. -No need for stress dose steroids as he is currently normo to hypertensive.  History of right renal cell carcinoma -Status post nephrectomy  Anemia of Chronic Disease -Due to RA. -Hb has drifted down to 9.4 from 11.1 on admission, suspect acute component to be dilutional anemia. -Follow and transfuse if <7.0.   DVT prophylaxis: Lovenox Code Status: Full code Family Communication: Discussed with son via phone on 3/5. Disposition Plan: To be determined, but likely home with [facility]. Obtain PT/OT consultations.  Consultants:   Urology  IR  Procedures:   None  Antimicrobials:  Anti-infectives (From admission, onward)   Start     Dose/Rate Route Frequency Ordered Stop   12/11/19 1200  cefTRIAXone (ROCEPHIN) 1 g in sodium chloride 0.9 % 100 mL IVPB  Status:  Discontinued     1 g 200 mL/hr over 30 Minutes Intravenous  Once 12/10/19 1831 12/11/19 0143   12/11/19 0800  ampicillin (OMNIPEN) 2 g in sodium chloride 0.9 % 100 mL IVPB     2 g 300 mL/hr over 20 Minutes Intravenous Every 4 hours 12/11/19 0143  12/11/19 0200  ampicillin (OMNIPEN) 2 g in sodium chloride 0.9 % 100 mL IVPB     2 g 300 mL/hr over 20 Minutes Intravenous NOW 12/11/19 0143 12/11/19 0401   12/10/19 1145  cefTRIAXone  (ROCEPHIN) 2 g in sodium chloride 0.9 % 100 mL IVPB     2 g 200 mL/hr over 30 Minutes Intravenous  Once 12/10/19 1138 12/10/19 1240   12/10/19 1145  metroNIDAZOLE (FLAGYL) IVPB 500 mg     500 mg 100 mL/hr over 60 Minutes Intravenous  Once 12/10/19 1139 12/10/19 1254       Subjective: In bed, sleepy but easily arousable and can respond to simple questions.  Objective: Vitals:   12/12/19 2300 12/13/19 0014 12/13/19 0458 12/13/19 0745  BP:  138/83 (!) 141/95 134/74  Pulse:  (!) 108 93 (!) 102  Resp:  18 18 18   Temp:   98.8 F (37.1 C) 97.9 F (36.6 C)  TempSrc:   Oral Oral  SpO2: 100% 100% 100% 100%  Weight:   76 kg   Height:        Intake/Output Summary (Last 24 hours) at 12/13/2019 0901 Last data filed at 12/13/2019 0509 Gross per 24 hour  Intake 440 ml  Output 1060 ml  Net -620 ml   Filed Weights   12/11/19 0503 12/12/19 0520 12/13/19 0458  Weight: 73.4 kg 75.4 kg 76 kg    Examination:  General exam: Sleepy but arousable. Respiratory system: Clear to auscultation. Respiratory effort normal. Cardiovascular system:RRR. No murmurs, rubs, gallops. Gastrointestinal system: Abdomen is nondistended, soft and nontender. No organomegaly or masses felt. Normal bowel sounds heard. Central nervous system: Difficult to assess given mental status Extremities: No C/C/E, +pedal pulses Psychiatry: Unable to fully assess given current mental status    Data Reviewed: I have personally reviewed following labs and imaging studies  CBC: Recent Labs  Lab 12/10/19 1138 12/10/19 1821 12/11/19 0330 12/12/19 0514 12/13/19 0344  WBC 18.6* 18.2* 15.9* 11.5* 10.0  NEUTROABS 16.1*  --   --   --   --   HGB 11.8* 11.1* 10.4* 9.0* 9.4*  HCT 37.9* 36.6* 33.9* 29.9* 30.5*  MCV 84.4 84.9 84.8 86.2 85.0  PLT 379 343 331 346 0000000   Basic Metabolic Panel: Recent Labs  Lab 12/10/19 1138 12/10/19 1821 12/11/19 0330 12/12/19 0514 12/13/19 0344  NA 132*  --  138 141 142  K 4.3  --  4.1  3.7 3.5  CL 101  --  109 116* 115*  CO2 17*  --  17* 15* 15*  GLUCOSE 153*  --  125* 118* 119*  BUN 47*  --  34* 25* 18  CREATININE 1.44* 1.24 1.02 0.97 0.80  CALCIUM 8.8*  --  8.3* 8.2* 8.1*  MG  --  2.3  --   --   --   PHOS  --  3.7  --   --   --    GFR: Estimated Creatinine Clearance: 67.5 mL/min (by C-G formula based on SCr of 0.8 mg/dL). Liver Function Tests: Recent Labs  Lab 12/10/19 1138 12/11/19 0330 12/12/19 0514  AST 63* 41 32  ALT 75* 51* 40  ALKPHOS 126 140* 101  BILITOT 1.1 1.0 0.6  PROT 7.0 5.7* 5.3*  ALBUMIN 2.6* 2.0* 1.8*   No results for input(s): LIPASE, AMYLASE in the last 168 hours. No results for input(s): AMMONIA in the last 168 hours. Coagulation Profile: Recent Labs  Lab 12/10/19 1138  INR 1.2   Cardiac  Enzymes: No results for input(s): CKTOTAL, CKMB, CKMBINDEX, TROPONINI in the last 168 hours. BNP (last 3 results) No results for input(s): PROBNP in the last 8760 hours. HbA1C: No results for input(s): HGBA1C in the last 72 hours. CBG: Recent Labs  Lab 12/11/19 0810  GLUCAP 105*   Lipid Profile: No results for input(s): CHOL, HDL, LDLCALC, TRIG, CHOLHDL, LDLDIRECT in the last 72 hours. Thyroid Function Tests: No results for input(s): TSH, T4TOTAL, FREET4, T3FREE, THYROIDAB in the last 72 hours. Anemia Panel: No results for input(s): VITAMINB12, FOLATE, FERRITIN, TIBC, IRON, RETICCTPCT in the last 72 hours. Urine analysis:    Component Value Date/Time   COLORURINE AMBER (A) 12/10/2019 1628   APPEARANCEUR HAZY (A) 12/10/2019 1628   LABSPEC 1.028 12/10/2019 1628   PHURINE 5.0 12/10/2019 1628   GLUCOSEU NEGATIVE 12/10/2019 1628   HGBUR NEGATIVE 12/10/2019 1628   BILIRUBINUR NEGATIVE 12/10/2019 1628   KETONESUR NEGATIVE 12/10/2019 1628   PROTEINUR 100 (A) 12/10/2019 1628   UROBILINOGEN 1.0 11/27/2013 0947   NITRITE NEGATIVE 12/10/2019 1628   LEUKOCYTESUR NEGATIVE 12/10/2019 1628   Sepsis  Labs: @LABRCNTIP (procalcitonin:4,lacticidven:4)  ) Recent Results (from the past 240 hour(s))  Blood Culture (routine x 2)     Status: Abnormal   Collection Time: 12/10/19 11:30 AM   Specimen: BLOOD  Result Value Ref Range Status   Specimen Description BLOOD RIGHT ANTECUBITAL  Final   Special Requests   Final    BOTTLES DRAWN AEROBIC AND ANAEROBIC Blood Culture adequate volume   Culture  Setup Time   Final    IN BOTH AEROBIC AND ANAEROBIC BOTTLES CRITICAL RESULT CALLED TO, READ BACK BY AND VERIFIED WITH: K AMEND PHARMD 12/11/19 0125 JDW GRAM POSITIVE COCCI Performed at [facility], [place] [address], [place], [place] [address]    Culture ENTEROCOCCUS FAECALIS (A)  Final   Report Status 12/13/2019 FINAL  Final   Organism ID, Bacteria ENTEROCOCCUS FAECALIS  Final      Susceptibility   Enterococcus faecalis - MIC*    AMPICILLIN <=2 SENSITIVE Sensitive     VANCOMYCIN 1 SENSITIVE Sensitive     GENTAMICIN SYNERGY SENSITIVE Sensitive     * ENTEROCOCCUS FAECALIS  Blood Culture ID Panel (Reflexed)     Status: Abnormal   Collection Time: 12/10/19 11:30 AM  Result Value Ref Range Status   Enterococcus species DETECTED (A) NOT DETECTED Final    Comment: CRITICAL RESULT CALLED TO, READ BACK BY AND VERIFIED WITH: K AMEND PHARMD 12/11/19 0125 JDW    Vancomycin resistance NOT DETECTED NOT DETECTED Final   Listeria monocytogenes NOT DETECTED NOT DETECTED Final   Staphylococcus species NOT DETECTED NOT DETECTED Final   Staphylococcus aureus (BCID) NOT DETECTED NOT DETECTED Final   Streptococcus species NOT DETECTED NOT DETECTED Final   Streptococcus agalactiae NOT DETECTED NOT DETECTED Final   Streptococcus pneumoniae NOT DETECTED NOT DETECTED Final   Streptococcus pyogenes NOT DETECTED NOT DETECTED Final   Acinetobacter baumannii NOT DETECTED NOT DETECTED Final   Enterobacteriaceae species NOT DETECTED NOT DETECTED Final   Enterobacter cloacae complex NOT DETECTED NOT DETECTED Final    Escherichia coli NOT DETECTED NOT DETECTED Final   Klebsiella oxytoca NOT DETECTED NOT DETECTED Final   Klebsiella pneumoniae NOT DETECTED NOT DETECTED Final   Proteus species NOT DETECTED NOT DETECTED Final   Serratia marcescens NOT DETECTED NOT DETECTED Final   Haemophilus influenzae NOT DETECTED NOT DETECTED Final   Neisseria meningitidis NOT DETECTED NOT DETECTED Final   Pseudomonas aeruginosa NOT  DETECTED NOT DETECTED Final   Candida albicans NOT DETECTED NOT DETECTED Final   Candida glabrata NOT DETECTED NOT DETECTED Final   Candida krusei NOT DETECTED NOT DETECTED Final   Candida parapsilosis NOT DETECTED NOT DETECTED Final   Candida tropicalis NOT DETECTED NOT DETECTED Final    Comment: Performed at [facility], [place] [address], [place], [place] [address]  Blood Culture (routine x 2)     Status: Abnormal   Collection Time: 12/10/19 11:37 AM   Specimen: BLOOD LEFT HAND  Result Value Ref Range Status   Specimen Description BLOOD LEFT HAND  Final   Special Requests   Final    BOTTLES DRAWN AEROBIC AND ANAEROBIC Blood Culture adequate volume   Culture  Setup Time   Final    IN BOTH AEROBIC AND ANAEROBIC BOTTLES GRAM POSITIVE COCCI IN CHAINS CRITICAL VALUE NOTED.  VALUE IS CONSISTENT WITH PREVIOUSLY REPORTED AND CALLED VALUE.    Culture (A)  Final    ENTEROCOCCUS FAECALIS SUSCEPTIBILITIES PERFORMED ON PREVIOUS CULTURE WITHIN THE LAST 5 DAYS. Performed at [facility], [place] [address], [place], [place] [address]    Report Status 12/13/2019 FINAL  Final  Urine culture     Status: Abnormal   Collection Time: 12/10/19  1:50 PM   Specimen: In/Out Cath Urine  Result Value Ref Range Status   Specimen Description IN/OUT CATH URINE  Final   Special Requests   Final    NONE Performed at [facility], [place] [address], [place], [place] [address]    Culture >=100,000 COLONIES/mL ENTEROCOCCUS FAECALIS (A)  Final   Report Status 12/12/2019 FINAL  Final   Organism ID,  Bacteria ENTEROCOCCUS FAECALIS (A)  Final      Susceptibility   Enterococcus faecalis - MIC*    AMPICILLIN <=2 SENSITIVE Sensitive     NITROFURANTOIN <=16 SENSITIVE Sensitive     VANCOMYCIN 1 SENSITIVE Sensitive     * >=100,000 COLONIES/mL ENTEROCOCCUS FAECALIS  SARS CORONAVIRUS 2 (TAT 6-24 HRS) Nasopharyngeal Nasopharyngeal Swab     Status: None   Collection Time: 12/10/19  7:02 PM   Specimen: Nasopharyngeal Swab  Result Value Ref Range Status   SARS Coronavirus 2 NEGATIVE NEGATIVE Final    Comment: (NOTE) SARS-CoV-2 target nucleic acids are NOT DETECTED. The SARS-CoV-2 RNA is generally detectable in upper and lower respiratory specimens during the acute phase of infection. Negative results do not preclude SARS-CoV-2 infection, do not rule out co-infections with other pathogens, and should not be used as the sole basis for treatment or other patient management decisions. Negative results must be combined with clinical observations, patient history, and epidemiological information. The expected result is Negative. Fact Sheet for Patients: SugarRoll.be Fact Sheet for Healthcare Providers: https://www.woods-mathews.com/ This test is not yet approved or cleared by the [place] FDA and  has been authorized for detection and/or diagnosis of SARS-CoV-2 by FDA under an Emergency Use Authorization (EUA). This EUA will remain  in effect (meaning this test can be used) for the duration of the COVID-19 declaration under Section 56 4(b)(1) of the Act, 21 U.S.C. section 360bbb-3(b)(1), unless the authorization is terminated or revoked sooner. Performed at [facility], [place] [address], [place], [place] [address]   Culture, blood (routine x 2)     Status: None (Preliminary result)   Collection Time: 12/12/19  5:16 AM   Specimen: BLOOD RIGHT HAND  Result Value Ref Range Status   Specimen Description BLOOD RIGHT HAND  Final   Special Requests    Final    BOTTLES DRAWN AEROBIC AND ANAEROBIC Blood Culture adequate volume   Culture   Final    NO GROWTH < 12 HOURS Performed at [facility], [address], [place], [place] [address]    Report Status PENDING  Incomplete  Culture, blood (routine x 2)     Status: None (Preliminary result)   Collection Time: 12/12/19  5:20 AM   Specimen: BLOOD LEFT HAND  Result Value Ref Range Status   Specimen Description BLOOD LEFT HAND  Final   Special Requests   Final    BOTTLES DRAWN AEROBIC AND ANAEROBIC Blood Culture adequate volume   Culture   Final    NO GROWTH < 12 HOURS Performed at [facility], [place] [address] [place], [place] [address]    Report Status PENDING  Incomplete         Radiology Studies: US RENAL  Result Date: 12/13/2019 CLINICAL DATA:  86 year old male with history of hydronephrosis noted on prior CT examination. Follow-up study. EXAM: RENAL / URINARY TRACT ULTRASOUND COMPLETE COMPARISON:  Abdominal ultrasound 09/16/2019. CT the abdomen and pelvis 12/10/2019. FINDINGS: Right Kidney: Status post right nephrectomy. Left Kidney: Renal measurements: 13.8 x 7.6 x 6.3 cm = volume: 348 mL. Echogenicity within normal limits. No mass or hydronephrosis visualized. Bladder: Foley balloon in the lumen of the urinary bladder. Urinary bladder remains moderately distended despite the presence of the Foley. Heterogeneous echogenicity noted dependently in the urinary bladder, which may represent debris or clot. Other: Prostate gland appears enlarged measuring 5.4 x 4.5 x 5.1 cm. IMPRESSION: 1. No residual left hydroureteronephrosis noted on today's examination. 2. Status post right nephrectomy. 3. Debris or clot lying dependently within the lumen of the urinary bladder. Urinary bladder is moderately distended despite the presence of an indwelling Foley catheter. 4. Mild prostatomegaly. Electronically Signed   By: Vinnie Langton M.D.   On: 12/13/2019 05:03   ECHOCARDIOGRAM  COMPLETE  Result Date: 12/11/2019    ECHOCARDIOGRAM REPORT   Patient Name:   KAYAN QUIGG Date of Exam: 12/11/2019 Medical Rec #:  [id number]   Height:       69.0 in Accession #:    [id number]  Weight:       161.8 lb Date of Birth:  01/04/1932  BSA:          1.888 m Patient Age:    25 years    BP:           133/75 mmHg Patient Gender: M           HR:           103 bpm. Exam Location:  Inpatient Procedure: 2D Echo Indications:    Bacteremia R78.81; Murmur R01.1  History:        Patient has prior history of Echocardiogram examinations, most                 recent 09/19/2019. CAD; Risk Factors:Hypertension.  Sonographer:    Mikki Santee RDCS (AE) Referring Phys: [place]  1. Left ventricular ejection fraction, by estimation, is 70 to 75%. The left ventricle has hyperdynamic function. The left ventricle has no regional wall motion abnormalities. There is mild left ventricular hypertrophy of the basal-septal segment. Left ventricular diastolic function could not be evaluated. Left ventricular diastolic function could not be evaluated.  2. Right ventricular systolic function is normal. The right ventricular size is normal.  There is normal pulmonary artery systolic pressure.  3. Left atrial size was moderately dilated.  4. The mitral valve is normal in structure and function. Mild to moderate mitral valve regurgitation. The MR is very eccentic and directed anteriorly.  5. The aortic valve is tricuspid. Aortic valve regurgitation is not visualized. FINDINGS  Left Ventricle: Left ventricular ejection fraction, by estimation, is 70 to 75%. The left ventricle has hyperdynamic function. The left ventricle has no regional wall motion abnormalities. The left ventricular internal cavity size was normal in size. There is mild left ventricular hypertrophy of the basal-septal segment. Left ventricular diastolic function could not be evaluated due to atrial fibrillation. Left ventricular diastolic  function could not be evaluated. Right Ventricle: The right ventricular size is normal. No increase in right ventricular wall thickness. Right ventricular systolic function is normal. There is normal pulmonary artery systolic pressure. The tricuspid regurgitant velocity is 2.35 m/s, and  with an assumed right atrial pressure of 3 mmHg, the estimated right ventricular systolic pressure is [id number] mmHg. Left Atrium: Left atrial size was moderately dilated. Right Atrium: Right atrial size was normal in size. Pericardium: There is no evidence of pericardial effusion. Mitral Valve: The mitral valve is normal in structure and function. Mild to moderate mitral valve regurgitation, with anteriorly-directed jet. Tricuspid Valve: The tricuspid valve is normal in structure. Tricuspid valve regurgitation is trivial. Aortic Valve: The aortic valve is tricuspid. Aortic valve regurgitation is not visualized. There is moderate calcification of the aortic valve. Pulmonic Valve: The pulmonic valve was not well visualized. Pulmonic valve regurgitation is trivial. Aorta: The aortic root is normal in size and structure. IAS/Shunts: The interatrial septum appears to be lipomatous. No atrial level shunt detected by color flow Doppler.  LEFT VENTRICLE PLAX 2D LVIDd:         3.50 cm LVIDs:         2.20 cm LV PW:         1.10 cm LV IVS:        1.10 cm LVOT diam:     2.10 cm LV SV:         61 LV SV Index:   32 LVOT Area:     3.46 cm  RIGHT VENTRICLE RV S prime:     13.60 cm/s TAPSE (M-mode): 1.5 cm LEFT ATRIUM             Index       RIGHT ATRIUM           Index LA diam:        3.10 cm 1.64 cm/m  RA Area:     14.90 cm LA Vol (A2C):   59.7 ml 31.62 ml/m RA Volume:   30.30 ml  16.05 ml/m LA Vol (A4C):   97.3 ml 51.54 ml/m LA Biplane Vol: 76.2 ml 40.36 ml/m  AORTIC VALVE LVOT Vmax:   107.00 cm/s LVOT Vmean:  64.200 cm/s LVOT VTI:    0.176 m  AORTA Ao Root diam: 3.70 cm TRICUSPID VALVE TR Peak grad:   22.1 mmHg TR Vmax:        235.00 cm/s   SHUNTS Systemic VTI:  0.18 m Systemic Diam: 2.10 cm Glori Bickers MD Electronically signed by Glori Bickers MD Signature Date/Time: 12/11/2019/6:52:08 PM    Final         Scheduled Meds: . Chlorhexidine Gluconate Cloth  6 each Topical Daily  . enoxaparin (LOVENOX) injection  40 mg Subcutaneous Q24H   Continuous Infusions: . sodium  chloride 125 mL/hr at 12/13/19 0540  . ampicillin (OMNIPEN) IV 2 g (12/13/19 0803)  . diltiazem (CARDIZEM) infusion 10 mg/hr (12/12/19 2321)     LOS: 3 days    Time spent: 25 minutes. Greater than 50% of this time was spent in direct contact with the patient, coordinating care and discussing relevant ongoing clinical issues.    Lelon Frohlich, MD [facility] Pager [phone]  If 7PM-7AM, please contact night-coverage [email or web address] Password [email or web address] 12/13/2019, 9:01 AM

## 2019-12-13 NOTE — Progress Notes (Signed)
Patient returned to unit from ultrasound.

## 2019-12-13 NOTE — Progress Notes (Signed)
Patient off unit to have ultrasound.

## 2019-12-14 DIAGNOSIS — N12 Tubulo-interstitial nephritis, not specified as acute or chronic: Secondary | ICD-10-CM

## 2019-12-14 LAB — COMPREHENSIVE METABOLIC PANEL
ALT: 48 U/L — ABNORMAL HIGH (ref 0–44)
AST: 43 U/L — ABNORMAL HIGH (ref 15–41)
Albumin: 1.6 g/dL — ABNORMAL LOW (ref 3.5–5.0)
Alkaline Phosphatase: 105 U/L (ref 38–126)
Anion gap: 9 (ref 5–15)
BUN: 14 mg/dL (ref 8–23)
CO2: 17 mmol/L — ABNORMAL LOW (ref 22–32)
Calcium: 8.1 mg/dL — ABNORMAL LOW (ref 8.9–10.3)
Chloride: 116 mmol/L — ABNORMAL HIGH (ref 98–111)
Creatinine, Ser: 0.76 mg/dL (ref 0.61–1.24)
GFR calc Af Amer: >60 mL/min (ref >60)
GFR calc non Af Amer: >60 mL/min (ref >60)
Glucose, Bld: 115 mg/dL — ABNORMAL HIGH (ref 70–99)
Potassium: 3.4 mmol/L — ABNORMAL LOW (ref 3.5–5.1)
Sodium: 142 mmol/L (ref 135–145)
Total Bilirubin: 0.7 mg/dL (ref 0.3–1.2)
Total Protein: 5.4 g/dL — ABNORMAL LOW (ref 6.5–8.1)

## 2019-12-14 LAB — CBC
HCT: 32.1 % — ABNORMAL LOW (ref 39.0–52.0)
Hemoglobin: 9.8 g/dL — ABNORMAL LOW (ref 13.0–17.0)
MCH: 26.1 pg (ref 26.0–34.0)
MCHC: 30.5 g/dL (ref 30.0–36.0)
MCV: 85.6 fL (ref 80.0–100.0)
Platelets: 379 10*3/uL (ref 150–400)
RBC: 3.75 MIL/uL — ABNORMAL LOW (ref 4.22–5.81)
RDW: 23.1 % — ABNORMAL HIGH (ref 11.5–15.5)
WBC: 11.6 10*3/uL — ABNORMAL HIGH (ref 4.0–10.5)
nRBC: 0 % (ref 0.0–0.2)

## 2019-12-14 LAB — GLUCOSE, CAPILLARY: Glucose-Capillary: 99 mg/dL (ref 70–99)

## 2019-12-14 MED ORDER — SODIUM CHLORIDE 0.9 % IV SOLN
INTRAVENOUS | Status: DC | PRN
  Administered 2019-12-14 – 2019-12-15 (×2): 250 mL via INTRAVENOUS
  Administered 2019-12-16: 1000 mL via INTRAVENOUS
  Administered 2019-12-16 – 2019-12-17 (×3): 250 mL via INTRAVENOUS

## 2019-12-14 MED ORDER — ENSURE ENLIVE PO LIQD
237.0000 mL | Freq: Three times a day (TID) | ORAL | Status: DC
  Administered 2019-12-14 – 2019-12-18 (×8): 237 mL via ORAL

## 2019-12-14 MED ORDER — ADULT MULTIVITAMIN W/MINERALS CH
1.0000 | ORAL_TABLET | Freq: Every day | ORAL | Status: DC
  Administered 2019-12-15 – 2019-12-17 (×2): 1 via ORAL
  Filled 2019-12-14 (×4): qty 1

## 2019-12-14 NOTE — Progress Notes (Signed)
PROGRESS NOTE    Derrick [name]  [id number] DOB: 06/15/1932 DOA: 12/10/2019 PCP: [name], MD     Brief Narrative:  86 year old man admitted from home on 3/4 with complaints of generalized weakness, lethargy, anorexia and abdominal pain for the past 2 weeks.  Per son he has had decreased urinary output but no fevers or chills.  His past medical history significant for a recent admission in December for sepsis secondary to acute cholecystitis status post percutaneous drainage by interventional radiology who has now completed antibiotic therapy, hypertension, stage III chronic kidney disease, right renal cell carcinoma status post right nephrectomy.  CT scan in the emergency department was concerning for left hydroureteronephrosis and signs of an upper UTI.  Admission was requested for further evaluation and management.   Assessment & Plan:   Principal Problem:   Enterococcal bacteremia Active Problems:   HTN (hypertension)   Anemia   CKD (chronic kidney disease)   Sepsis (HCC)   RA (rheumatoid arthritis) (HCC)   Atrial fibrillation with RVR (HCC)   Hydronephrosis of left kidney   AMS (altered mental status)   Depression   Cholecystitis, S/P drain 09-2019   Goals of care, counseling/discussion   Palliative care by specialist   Enterococcal sepsis -Thought urinary source as urine cx also positive. -Plan to continue IV ampicillin. -Appreciate ID following. -Afebrile overnight, leukocytosis continues to improve: 18.6-->18.2-->15.9-->11.5-->10.0-->11.6. -Urology is recommending repeat renal US 3/7: No residual hydroureteronephrosis, clot in the bladder lumen with distended bladder despite foley catheter. He has a h/o prostate cancer. GU is following; appreciate their input and recommendations. -2D ECHO without vegetations. -Repeat BX remain negative at day 2. If they turn positive may need to consider TEE to r/o endocarditis. -Given advanced age and multiple medical  comorbidities, appreciate palliative care following as well.  Acute metabolic encephalopathy -Likely due to sepsis. -CT scan of the head is negative for CVA. -May need to consider repeat CT head vs MRI as his encephalopathy does not seem to be resolving despite IVFs and IV antibiotics. Will allow another 24 hours of abx before making that decision.  Atrial fibrillation with RVR -In the setting of sepsis, heart rate currently in the 90s. -He remains on a Cardizem drip. -Can consider transitioning to p.o. once his oral intake is improved. -He is not on anticoagulation at home despite high CHADSVASC score, suspect on account of age, frailty and deconditioning.  Recent history of cholecystitis with percutaneous drainage -Scheduled for OP cholecystectomy 4/5. -Appreciate surgical consultation. -Follow LFTs (at baseline).  Rheumatoid arthritis -At home on prednisone and Plaquenil. -No need for stress dose steroids as he is currently normo to hypertensive.  History of right renal cell carcinoma -Status post nephrectomy  Anemia of Chronic Disease -Due to RA. -Hb has drifted down to 9.8 from 11.1 on admission, suspect acute component to be dilutional anemia. -Follow and transfuse if <7.0.   DVT prophylaxis: Lovenox Code Status: Full code Family Communication: Discussed with son via phone on 3/7. Disposition Plan: To be determined, but likely home with [facility] per son's wishes. Obtain PT/OT consultations once more alert  Consultants:   Urology  IR  Procedures:   None  Antimicrobials:  Anti-infectives (From admission, onward)   Start     Dose/Rate Route Frequency Ordered Stop   12/11/19 1200  cefTRIAXone (ROCEPHIN) 1 g in sodium chloride 0.9 % 100 mL IVPB  Status:  Discontinued     1 g 200 mL/hr over 30 Minutes Intravenous  Once 12/10/19 1831  12/11/19 0143   12/11/19 0800  ampicillin (OMNIPEN) 2 g in sodium chloride 0.9 % 100 mL IVPB     2 g 300 mL/hr over 20 Minutes Intravenous  Every 4 hours 12/11/19 0143     12/11/19 0200  ampicillin (OMNIPEN) 2 g in sodium chloride 0.9 % 100 mL IVPB     2 g 300 mL/hr over 20 Minutes Intravenous NOW 12/11/19 0143 12/11/19 0401   12/10/19 1145  cefTRIAXone (ROCEPHIN) 2 g in sodium chloride 0.9 % 100 mL IVPB     2 g 200 mL/hr over 30 Minutes Intravenous  Once 12/10/19 1138 12/10/19 1240   12/10/19 1145  metroNIDAZOLE (FLAGYL) IVPB 500 mg     500 mg 100 mL/hr over 60 Minutes Intravenous  Once 12/10/19 1139 12/10/19 1254       Subjective: In bed, sleepy but easily arousable and can respond to simple questions.  Objective: Vitals:   12/14/19 0041 12/14/19 0341 12/14/19 0745 12/14/19 0851  BP: (!) 144/90  (!) 154/88 (!) 150/87  Pulse: (!) 116  (!) 106 99  Resp: 18  (!) 29 20  Temp: 99.5 F (37.5 C)  98.6 F (37 C) 98.4 F (36.9 C)  TempSrc: Oral  Oral Axillary  SpO2: 99%  98% 98%  Weight:  82.8 kg    Height:        Intake/Output Summary (Last 24 hours) at 12/14/2019 0928 Last data filed at 12/14/2019 [id number] Gross per 24 hour  Intake 8401.42 ml  Output 1250 ml  Net 7151.42 ml   Filed Weights   12/12/19 0520 12/13/19 0458 12/14/19 0341  Weight: 75.4 kg 76 kg 82.8 kg    Examination:  General exam: Sleepy but arousable. Respiratory system: Clear to auscultation. Respiratory effort normal. Cardiovascular system:RRR. No murmurs, rubs, gallops. Gastrointestinal system: Abdomen is nondistended, soft and nontender. No organomegaly or masses felt. Normal bowel sounds heard. Central nervous system: Difficult to assess given mental status Extremities: No C/C/E, +pedal pulses Psychiatry: Unable to fully assess given current mental status    Data Reviewed: I have personally reviewed following labs and imaging studies  CBC: Recent Labs  Lab 12/10/19 1138 12/10/19 1138 12/10/19 1821 12/11/19 0330 12/12/19 0514 12/13/19 0344 12/14/19 0421  WBC 18.6*   < > 18.2* 15.9* 11.5* 10.0 11.6*  NEUTROABS 16.1*  --   --   --    --   --   --   HGB 11.8*   < > 11.1* 10.4* 9.0* 9.4* 9.8*  HCT 37.9*   < > 36.6* 33.9* 29.9* 30.5* 32.1*  MCV 84.4   < > 84.9 84.8 86.2 85.0 85.6  PLT 379   < > 343 331 346 349 379   < > = values in this interval not displayed.   Basic Metabolic Panel: Recent Labs  Lab 12/10/19 1138 12/10/19 1138 12/10/19 1821 12/11/19 0330 12/12/19 0514 12/13/19 0344 12/14/19 0421  NA 132*  --   --  138 141 142 142  K 4.3  --   --  4.1 3.7 3.5 3.4*  CL 101  --   --  109 116* 115* 116*  CO2 17*  --   --  17* 15* 15* 17*  GLUCOSE 153*  --   --  125* 118* 119* 115*  BUN 47*  --   --  34* 25* 18 14  CREATININE 1.44*   < > 1.24 1.02 0.97 0.80 0.76  CALCIUM 8.8*  --   --  8.3* 8.2*  8.1* 8.1*  MG  --   --  2.3  --   --   --   --   PHOS  --   --  3.7  --   --   --   --    < > = values in this interval not displayed.   GFR: Estimated Creatinine Clearance: 67.5 mL/min (by C-G formula based on SCr of 0.76 mg/dL). Liver Function Tests: Recent Labs  Lab 12/10/19 1138 12/11/19 0330 12/12/19 0514 12/14/19 0421  AST 63* 41 32 43*  ALT 75* 51* 40 48*  ALKPHOS 126 140* 101 105  BILITOT 1.1 1.0 0.6 0.7  PROT 7.0 5.7* 5.3* 5.4*  ALBUMIN 2.6* 2.0* 1.8* 1.6*   No results for input(s): LIPASE, AMYLASE in the last 168 hours. No results for input(s): AMMONIA in the last 168 hours. Coagulation Profile: Recent Labs  Lab 12/10/19 1138  INR 1.2   Cardiac Enzymes: No results for input(s): CKTOTAL, CKMB, CKMBINDEX, TROPONINI in the last 168 hours. BNP (last 3 results) No results for input(s): PROBNP in the last 8760 hours. HbA1C: No results for input(s): HGBA1C in the last 72 hours. CBG: Recent Labs  Lab 12/11/19 0810 12/14/19 0623  GLUCAP 105* 99   Lipid Profile: No results for input(s): CHOL, HDL, LDLCALC, TRIG, CHOLHDL, LDLDIRECT in the last 72 hours. Thyroid Function Tests: No results for input(s): TSH, T4TOTAL, FREET4, T3FREE, THYROIDAB in the last 72 hours. Anemia Panel: No results for  input(s): VITAMINB12, FOLATE, FERRITIN, TIBC, IRON, RETICCTPCT in the last 72 hours. Urine analysis:    Component Value Date/Time   COLORURINE AMBER (A) 12/10/2019 1628   APPEARANCEUR HAZY (A) 12/10/2019 1628   LABSPEC 1.028 12/10/2019 1628   PHURINE 5.0 12/10/2019 1628   GLUCOSEU NEGATIVE 12/10/2019 1628   HGBUR NEGATIVE 12/10/2019 1628   BILIRUBINUR NEGATIVE 12/10/2019 1628   KETONESUR NEGATIVE 12/10/2019 1628   PROTEINUR 100 (A) 12/10/2019 1628   UROBILINOGEN 1.0 11/27/2013 0947   NITRITE NEGATIVE 12/10/2019 1628   LEUKOCYTESUR NEGATIVE 12/10/2019 1628   Sepsis Labs: @LABRCNTIP (procalcitonin:4,lacticidven:4)  ) Recent Results (from the past 240 hour(s))  Blood Culture (routine x 2)     Status: Abnormal   Collection Time: 12/10/19 11:30 AM   Specimen: BLOOD  Result Value Ref Range Status   Specimen Description BLOOD RIGHT ANTECUBITAL  Final   Special Requests   Final    BOTTLES DRAWN AEROBIC AND ANAEROBIC Blood Culture adequate volume   Culture  Setup Time   Final    IN BOTH AEROBIC AND ANAEROBIC BOTTLES CRITICAL RESULT CALLED TO, READ BACK BY AND VERIFIED WITH: K AMEND PHARMD 12/11/19 0125 JDW GRAM POSITIVE COCCI Performed at [facility], [place] [address] [place], [place] [address]    Culture ENTEROCOCCUS FAECALIS (A)  Final   Report Status 12/13/2019 FINAL  Final   Organism ID, Bacteria ENTEROCOCCUS FAECALIS  Final      Susceptibility   Enterococcus faecalis - MIC*    AMPICILLIN <=2 SENSITIVE Sensitive     VANCOMYCIN 1 SENSITIVE Sensitive     GENTAMICIN SYNERGY SENSITIVE Sensitive     * ENTEROCOCCUS FAECALIS  Blood Culture ID Panel (Reflexed)     Status: Abnormal   Collection Time: 12/10/19 11:30 AM  Result Value Ref Range Status   Enterococcus species DETECTED (A) NOT DETECTED Final    Comment: CRITICAL RESULT CALLED TO, READ BACK BY AND VERIFIED WITH: K AMEND PHARMD 12/11/19 0125 JDW    Vancomycin resistance NOT DETECTED NOT  DETECTED Final   Listeria  monocytogenes NOT DETECTED NOT DETECTED Final   Staphylococcus species NOT DETECTED NOT DETECTED Final   Staphylococcus aureus (BCID) NOT DETECTED NOT DETECTED Final   Streptococcus species NOT DETECTED NOT DETECTED Final   Streptococcus agalactiae NOT DETECTED NOT DETECTED Final   Streptococcus pneumoniae NOT DETECTED NOT DETECTED Final   Streptococcus pyogenes NOT DETECTED NOT DETECTED Final   Acinetobacter baumannii NOT DETECTED NOT DETECTED Final   Enterobacteriaceae species NOT DETECTED NOT DETECTED Final   Enterobacter cloacae complex NOT DETECTED NOT DETECTED Final   Escherichia coli NOT DETECTED NOT DETECTED Final   Klebsiella oxytoca NOT DETECTED NOT DETECTED Final   Klebsiella pneumoniae NOT DETECTED NOT DETECTED Final   Proteus species NOT DETECTED NOT DETECTED Final   Serratia marcescens NOT DETECTED NOT DETECTED Final   Haemophilus influenzae NOT DETECTED NOT DETECTED Final   Neisseria meningitidis NOT DETECTED NOT DETECTED Final   Pseudomonas aeruginosa NOT DETECTED NOT DETECTED Final   Candida albicans NOT DETECTED NOT DETECTED Final   Candida glabrata NOT DETECTED NOT DETECTED Final   Candida krusei NOT DETECTED NOT DETECTED Final   Candida parapsilosis NOT DETECTED NOT DETECTED Final   Candida tropicalis NOT DETECTED NOT DETECTED Final    Comment: Performed at [facility], [place] [address], [place], [place] [address]  Blood Culture (routine x 2)     Status: Abnormal   Collection Time: 12/10/19 11:37 AM   Specimen: BLOOD LEFT HAND  Result Value Ref Range Status   Specimen Description BLOOD LEFT HAND  Final   Special Requests   Final    BOTTLES DRAWN AEROBIC AND ANAEROBIC Blood Culture adequate volume   Culture  Setup Time   Final    IN BOTH AEROBIC AND ANAEROBIC BOTTLES GRAM POSITIVE COCCI IN CHAINS CRITICAL VALUE NOTED.  VALUE IS CONSISTENT WITH PREVIOUSLY REPORTED AND CALLED VALUE.    Culture (A)  Final    ENTEROCOCCUS FAECALIS SUSCEPTIBILITIES PERFORMED ON  PREVIOUS CULTURE WITHIN THE LAST 5 DAYS. Performed at [facility], [place] [address], [place], [place] [address]    Report Status 12/13/2019 FINAL  Final  Urine culture     Status: Abnormal   Collection Time: 12/10/19  1:50 PM   Specimen: In/Out Cath Urine  Result Value Ref Range Status   Specimen Description IN/OUT CATH URINE  Final   Special Requests   Final    NONE Performed at [facility], [place] [address] [place], [place] [address]    Culture >=100,000 COLONIES/mL ENTEROCOCCUS FAECALIS (A)  Final   Report Status 12/12/2019 FINAL  Final   Organism ID, Bacteria ENTEROCOCCUS FAECALIS (A)  Final      Susceptibility   Enterococcus faecalis - MIC*    AMPICILLIN <=2 SENSITIVE Sensitive     NITROFURANTOIN <=16 SENSITIVE Sensitive     VANCOMYCIN 1 SENSITIVE Sensitive     * >=100,000 COLONIES/mL ENTEROCOCCUS FAECALIS  SARS CORONAVIRUS 2 (TAT 6-24 HRS) Nasopharyngeal Nasopharyngeal Swab     Status: None   Collection Time: 12/10/19  7:02 PM   Specimen: Nasopharyngeal Swab  Result Value Ref Range Status   SARS Coronavirus 2 NEGATIVE NEGATIVE Final    Comment: (NOTE) SARS-CoV-2 target nucleic acids are NOT DETECTED. The SARS-CoV-2 RNA is generally detectable in upper and lower respiratory specimens during the acute phase of infection. Negative results do not preclude SARS-CoV-2 infection, do not rule out co-infections with other pathogens, and should not be used as the sole  basis for treatment or other patient management decisions. Negative results must be combined with clinical observations, patient history, and epidemiological information. The expected result is Negative. Fact Sheet for Patients: SugarRoll.be Fact Sheet for Healthcare Providers: https://www.woods-mathews.com/ This test is not yet approved or cleared by the [place] FDA and  has been authorized for detection and/or diagnosis of SARS-CoV-2 by FDA under an  Emergency Use Authorization (EUA). This EUA will remain  in effect (meaning this test can be used) for the duration of the COVID-19 declaration under Section 56 4(b)(1) of the Act, 21 U.S.C. section 360bbb-3(b)(1), unless the authorization is terminated or revoked sooner. Performed at [facility], [place] [address], [place], [place] [address]   Culture, blood (routine x 2)     Status: None (Preliminary result)   Collection Time: 12/12/19  5:16 AM   Specimen: BLOOD RIGHT HAND  Result Value Ref Range Status   Specimen Description BLOOD RIGHT HAND  Final   Special Requests   Final    BOTTLES DRAWN AEROBIC AND ANAEROBIC Blood Culture adequate volume   Culture   Final    NO GROWTH 2 DAYS Performed at [facility], [place] [address] [place], [place] [address]    Report Status PENDING  Incomplete  Culture, blood (routine x 2)     Status: None (Preliminary result)   Collection Time: 12/12/19  5:20 AM   Specimen: BLOOD LEFT HAND  Result Value Ref Range Status   Specimen Description BLOOD LEFT HAND  Final   Special Requests   Final    BOTTLES DRAWN AEROBIC AND ANAEROBIC Blood Culture adequate volume   Culture   Final    NO GROWTH 2 DAYS Performed at [facility], [place] [address], [place], [place] [address]    Report Status PENDING  Incomplete         Radiology Studies: US RENAL  Result Date: 12/13/2019 CLINICAL DATA:  86 year old male with history of hydronephrosis noted on prior CT examination. Follow-up study. EXAM: RENAL / URINARY TRACT ULTRASOUND COMPLETE COMPARISON:  Abdominal ultrasound 09/16/2019. CT the abdomen and pelvis 12/10/2019. FINDINGS: Right Kidney: Status post right nephrectomy. Left Kidney: Renal measurements: 13.8 x 7.6 x 6.3 cm = volume: 348 mL. Echogenicity within normal limits. No mass or hydronephrosis visualized. Bladder: Foley balloon in the lumen of the urinary bladder. Urinary bladder remains moderately distended despite the presence of the Foley.  Heterogeneous echogenicity noted dependently in the urinary bladder, which may represent debris or clot. Other: Prostate gland appears enlarged measuring 5.4 x 4.5 x 5.1 cm. IMPRESSION: 1. No residual left hydroureteronephrosis noted on today's examination. 2. Status post right nephrectomy. 3. Debris or clot lying dependently within the lumen of the urinary bladder. Urinary bladder is moderately distended despite the presence of an indwelling Foley catheter. 4. Mild prostatomegaly. Electronically Signed   By: Vinnie Langton M.D.   On: 12/13/2019 05:03        Scheduled Meds: . Chlorhexidine Gluconate Cloth  6 each Topical Daily  . enoxaparin (LOVENOX) injection  40 mg Subcutaneous Q24H   Continuous Infusions: . sodium chloride 125 mL/hr at 12/14/19 [id number]  . ampicillin (OMNIPEN) IV 2 g (12/14/19 0807)  . diltiazem (CARDIZEM) infusion 10 mg/hr (12/14/19 0317)     LOS: 4 days    Time spent: 25 minutes. Greater than 50% of this time was spent in direct contact with the patient, coordinating care and discussing relevant ongoing clinical issues.    Domingo Mend  Deniece Ree, MD [facility] Pager [phone]  If 7PM-7AM, please contact night-coverage [email or web address] Password [facility] 12/14/2019, 9:28 AM

## 2019-12-14 NOTE — Progress Notes (Signed)
   Vital Signs MEWS/VS Documentation      12/13/2019 2347 12/14/2019 0041 12/14/2019 0325 12/14/2019 0745   MEWS Score:  1  2  2  3    MEWS Score Color:  Green  Yellow  Yellow  Yellow   Resp:  --  18  --  (!) 29   Pulse:  --  (!) 116  --  (!) 106   BP:  --  (!) 144/90  --  (!) 154/88   Temp:  --  99.5 F (37.5 C)  --  98.6 F (37 C)   O2 Device:  --  Nasal Cannula  --  Nasal Cannula   Level of Consciousness:  Alert  --  Alert  --     Patients respiration rate 29, during assessment patient is sleeping in bed. Responds to voice. Does not follow commands. Does not appear to be in respiratory distress, lung sounds clear. MD was notified. Will follow yellow MEWS protocol       Samuella Cota 12/14/2019,8:12 AM

## 2019-12-14 NOTE — Progress Notes (Signed)
  Speech Language Pathology Treatment: Dysphagia  Patient Details Name: Derrick [name] MRN: [id number] DOB: 07-10-1932 Today's Date: 12/14/2019 Time: [id number] SLP Time Calculation (min) (ACUTE ONLY): 29 min  Assessment / Plan / Recommendation Clinical Impression  Pt quite sleepy today upon arrival.  Wife at bedside.  Washed face with cool cloth - pt able to engage verbally, although maintained eyes closed for duration of session. He responded to biographical questions with intermittent reliability.  Pt consumed sips of water from a straw and ate 100% of Magic Cup and 50% of pureed peaches with prolonged but functional oral phase, brisk swallow response, no s/s of aspiration.  Provided education to Mrs. Poust, advising her to only feed her husband when he can communicate with her and to hold food/liquid if he begins coughing. She verbalized understanding.   SLP will follow for diet advancement and safety.    HPI HPI: Pt is an 86 y.o. male with medical history significant of rheumatoid arthritis on prednisone, Plaquenil, coronary artery disease, PAD, depression, hypertension, CKD stage III, right renal carcinoma status post right nephrectomy, cholecystitis status post cholecystectomy and tube placement by IR who presented to the ED with generalized weakness, lethargic, loss of appetite and abdominal pain since 2 weeks. CT of the head negative for acute changes. Extensive bilateral corona radiata white matter disease. Chest x-ray 3/4: no acute disease.      SLP Plan  Continue with current plan of care       Recommendations  Diet recommendations: Dysphagia 1 (puree);Thin liquid Liquids provided via: Straw;Cup Medication Administration: Crushed with puree Supervision: Full supervision/cueing for compensatory strategies Compensations: Minimize environmental distractions                Oral Care Recommendations: Oral care BID Follow up Recommendations: Other (comment)(tbd) SLP Visit  Diagnosis: Dysphagia, oral phase (R13.11) Plan: Continue with current plan of care       GO                Assunta Curtis 12/14/2019, 2:43 PM Linzie Criss L. Tivis Ringer, [place] Office number [phone] Pager [phone]

## 2019-12-14 NOTE — Progress Notes (Signed)
[facility] for Infectious Disease  Date of Admission:  12/10/2019     Total days of antibiotics 5         ASSESSMENT:  Derrick [name] continues to remain afebrile and is tolerating ampicillin with no adverse side effects. Repeat blood cultures from 3/6 remain without growth to date. Palliative Medicine is following for goals of care discussion. Continue current dose of ampicillin for enterococcus bacteremia likely of urinary source. If pursuing treatment, recommend 4 weeks of antibiotic therapy with ampicillin using 3/6 as the start date and will need a PICC line. OPAT orders will be placed and may be discontinued if comfort care measures are sought. ID will sign off and be available as needed during hospitalization.   PLAN:  1. Continue ampicillin.  2. Pending goals of care will need PICC line placement and end date of ampicillin 01/09/20.  3. OPAT orders placed.   Diagnosis: Enterococcus bacteremia  Culture Result: Enterococcus faecalis   Allergies  Allergen Reactions  . Effexor [Venlafaxine] Other (See Comments)    Sexual side affects  . Lisinopril Cough  . Paxil [Paroxetine] Other (See Comments)    Sexual side affects  . Viagra [Sildenafil] Other (See Comments)    Chest discomfort  . Wellbutrin [Bupropion] Itching    OPAT Orders Discharge antibiotics: Ampicillin  Per pharmacy protocol  Aim for Vancomycin trough 15-20 or AUC 400-550 (unless otherwise indicated) Duration: 4 weeks  End Date: 01/09/20  [facility] Care Per Protocol:  Home health RN for IV administration and teaching; PICC line care and labs.    Labs weekly while on IV antibiotics: _X_ CBC with differential _X_ BMP __ CMP __ CRP __ ESR __ Vancomycin trough __ CK  _X_ Please pull PIC at completion of IV antibiotics __ Please leave PIC in place until doctor has seen patient or been notified  Fax weekly labs to [phone]  Clinic Follow Up Appt: As needed pending goals of care.    Principal  Problem:   Enterococcal bacteremia Active Problems:   HTN (hypertension)   Anemia   CKD (chronic kidney disease)   Sepsis (HCC)   RA (rheumatoid arthritis) (HCC)   Atrial fibrillation with RVR (HCC)   Hydronephrosis of left kidney   AMS (altered mental status)   Depression   Cholecystitis, S/P drain 09-2019   Goals of care, counseling/discussion   Palliative care by specialist   . Chlorhexidine Gluconate Cloth  6 each Topical Daily  . enoxaparin (LOVENOX) injection  40 mg Subcutaneous Q24H  . feeding supplement (ENSURE ENLIVE)  237 mL Oral TID BM  . multivitamin with minerals  1 tablet Oral Daily    SUBJECTIVE:  Afebrile overnight with no acute events. Sleeping upon entry and easily arousable.   Allergies  Allergen Reactions  . Effexor [Venlafaxine] Other (See Comments)    Sexual side affects  . Lisinopril Cough  . Paxil [Paroxetine] Other (See Comments)    Sexual side affects  . Viagra [Sildenafil] Other (See Comments)    Chest discomfort  . Wellbutrin [Bupropion] Itching     Review of Systems: Review of Systems  Constitutional: Negative for chills, fever and weight loss.  Respiratory: Negative for cough, shortness of breath and wheezing.   Cardiovascular: Negative for chest pain and leg swelling.  Gastrointestinal: Negative for abdominal pain, constipation, diarrhea, nausea and vomiting.  Skin: Negative for rash.      OBJECTIVE: Vitals:   12/14/19 1230 12/14/19 1231 12/14/19 1245 12/14/19 1400  BP: Marland Kitchen)  151/80  133/79 136/76  Pulse: 88  98 (!) 101  Resp: (!) '25  20 20  ' Temp:  (!) 97.3 F (36.3 C)    TempSrc:  Oral    SpO2: 96%  96% 97%  Weight:      Height:       Body mass index is 26.96 kg/m.  Physical Exam Constitutional:      General: He is not in acute distress.    Appearance: He is well-developed.     Comments: Sleeping on entry; arousable; lethargic.   Cardiovascular:     Rate and Rhythm: Normal rate and regular rhythm.     Heart  sounds: Normal heart sounds.  Pulmonary:     Effort: Pulmonary effort is normal.     Breath sounds: Normal breath sounds.  Skin:    General: Skin is warm and dry.  Neurological:     Mental Status: He is alert and oriented to person, place, and time.  Psychiatric:        Behavior: Behavior normal.        Thought Content: Thought content normal.        Judgment: Judgment normal.     Lab Results Lab Results  Component Value Date   WBC 11.6 (H) 12/14/2019   HGB 9.8 (L) 12/14/2019   HCT 32.1 (L) 12/14/2019   MCV 85.6 12/14/2019   PLT 379 12/14/2019    Lab Results  Component Value Date   CREATININE 0.76 12/14/2019   BUN 14 12/14/2019   NA 142 12/14/2019   K 3.4 (L) 12/14/2019   CL 116 (H) 12/14/2019   CO2 17 (L) 12/14/2019    Lab Results  Component Value Date   ALT 48 (H) 12/14/2019   AST 43 (H) 12/14/2019   ALKPHOS 105 12/14/2019   BILITOT 0.7 12/14/2019     Microbiology: Recent Results (from the past 240 hour(s))  Blood Culture (routine x 2)     Status: Abnormal   Collection Time: 12/10/19 11:30 AM   Specimen: BLOOD  Result Value Ref Range Status   Specimen Description BLOOD RIGHT ANTECUBITAL  Final   Special Requests   Final    BOTTLES DRAWN AEROBIC AND ANAEROBIC Blood Culture adequate volume   Culture  Setup Time   Final    IN BOTH AEROBIC AND ANAEROBIC BOTTLES CRITICAL RESULT CALLED TO, READ BACK BY AND VERIFIED WITH: K AMEND PHARMD 12/11/19 0125 JDW GRAM POSITIVE COCCI Performed at [facility], [address], [place], [place] [address]    Culture ENTEROCOCCUS FAECALIS (A)  Final   Report Status 12/13/2019 FINAL  Final   Organism ID, Bacteria ENTEROCOCCUS FAECALIS  Final      Susceptibility   Enterococcus faecalis - MIC*    AMPICILLIN <=2 SENSITIVE Sensitive     VANCOMYCIN 1 SENSITIVE Sensitive     GENTAMICIN SYNERGY SENSITIVE Sensitive     * ENTEROCOCCUS FAECALIS  Blood Culture ID Panel (Reflexed)     Status: Abnormal   Collection Time: 12/10/19  11:30 AM  Result Value Ref Range Status   Enterococcus species DETECTED (A) NOT DETECTED Final    Comment: CRITICAL RESULT CALLED TO, READ BACK BY AND VERIFIED WITH: K AMEND PHARMD 12/11/19 0125 JDW    Vancomycin resistance NOT DETECTED NOT DETECTED Final   Listeria monocytogenes NOT DETECTED NOT DETECTED Final   Staphylococcus species NOT DETECTED NOT DETECTED Final   Staphylococcus aureus (BCID) NOT DETECTED NOT DETECTED Final   Streptococcus species NOT DETECTED  NOT DETECTED Final   Streptococcus agalactiae NOT DETECTED NOT DETECTED Final   Streptococcus pneumoniae NOT DETECTED NOT DETECTED Final   Streptococcus pyogenes NOT DETECTED NOT DETECTED Final   Acinetobacter baumannii NOT DETECTED NOT DETECTED Final   Enterobacteriaceae species NOT DETECTED NOT DETECTED Final   Enterobacter cloacae complex NOT DETECTED NOT DETECTED Final   Escherichia coli NOT DETECTED NOT DETECTED Final   Klebsiella oxytoca NOT DETECTED NOT DETECTED Final   Klebsiella pneumoniae NOT DETECTED NOT DETECTED Final   Proteus species NOT DETECTED NOT DETECTED Final   Serratia marcescens NOT DETECTED NOT DETECTED Final   Haemophilus influenzae NOT DETECTED NOT DETECTED Final   Neisseria meningitidis NOT DETECTED NOT DETECTED Final   Pseudomonas aeruginosa NOT DETECTED NOT DETECTED Final   Candida albicans NOT DETECTED NOT DETECTED Final   Candida glabrata NOT DETECTED NOT DETECTED Final   Candida krusei NOT DETECTED NOT DETECTED Final   Candida parapsilosis NOT DETECTED NOT DETECTED Final   Candida tropicalis NOT DETECTED NOT DETECTED Final    Comment: Performed at [facility], [place] [address] [place], [place] [address]  Blood Culture (routine x 2)     Status: Abnormal   Collection Time: 12/10/19 11:37 AM   Specimen: BLOOD LEFT HAND  Result Value Ref Range Status   Specimen Description BLOOD LEFT HAND  Final   Special Requests   Final    BOTTLES DRAWN AEROBIC AND ANAEROBIC Blood Culture adequate  volume   Culture  Setup Time   Final    IN BOTH AEROBIC AND ANAEROBIC BOTTLES GRAM POSITIVE COCCI IN CHAINS CRITICAL VALUE NOTED.  VALUE IS CONSISTENT WITH PREVIOUSLY REPORTED AND CALLED VALUE.    Culture (A)  Final    ENTEROCOCCUS FAECALIS SUSCEPTIBILITIES PERFORMED ON PREVIOUS CULTURE WITHIN THE LAST 5 DAYS. Performed at [facility], [place] [address], [place], [place] [address]    Report Status 12/13/2019 FINAL  Final  Urine culture     Status: Abnormal   Collection Time: 12/10/19  1:50 PM   Specimen: In/Out Cath Urine  Result Value Ref Range Status   Specimen Description IN/OUT CATH URINE  Final   Special Requests   Final    NONE Performed at [facility], [place] [address], [place], [place] [address]    Culture >=100,000 COLONIES/mL ENTEROCOCCUS FAECALIS (A)  Final   Report Status 12/12/2019 FINAL  Final   Organism ID, Bacteria ENTEROCOCCUS FAECALIS (A)  Final      Susceptibility   Enterococcus faecalis - MIC*    AMPICILLIN <=2 SENSITIVE Sensitive     NITROFURANTOIN <=16 SENSITIVE Sensitive     VANCOMYCIN 1 SENSITIVE Sensitive     * >=100,000 COLONIES/mL ENTEROCOCCUS FAECALIS  SARS CORONAVIRUS 2 (TAT 6-24 HRS) Nasopharyngeal Nasopharyngeal Swab     Status: None   Collection Time: 12/10/19  7:02 PM   Specimen: Nasopharyngeal Swab  Result Value Ref Range Status   SARS Coronavirus 2 NEGATIVE NEGATIVE Final    Comment: (NOTE) SARS-CoV-2 target nucleic acids are NOT DETECTED. The SARS-CoV-2 RNA is generally detectable in upper and lower respiratory specimens during the acute phase of infection. Negative results do not preclude SARS-CoV-2 infection, do not rule out co-infections with other pathogens, and should not be used as the sole basis for treatment or other patient management decisions. Negative results must be combined with clinical observations, patient history, and epidemiological information. The expected result is Negative. Fact Sheet for  Patients: SugarRoll.be Fact Sheet for Healthcare Providers:  https://www.woods-mathews.com/ This test is not yet approved or cleared by the [place] and  has been authorized for detection and/or diagnosis of SARS-CoV-2 by FDA under an Emergency Use Authorization (EUA). This EUA will remain  in effect (meaning this test can be used) for the duration of the COVID-19 declaration under Section 56 4(b)(1) of the Act, 21 U.S.C. section 360bbb-3(b)(1), unless the authorization is terminated or revoked sooner. Performed at [facility], [place] [address], [place], [place] [address]   Culture, blood (routine x 2)     Status: None (Preliminary result)   Collection Time: 12/12/19  5:16 AM   Specimen: BLOOD RIGHT HAND  Result Value Ref Range Status   Specimen Description BLOOD RIGHT HAND  Final   Special Requests   Final    BOTTLES DRAWN AEROBIC AND ANAEROBIC Blood Culture adequate volume   Culture   Final    NO GROWTH 2 DAYS Performed at [facility], [place] [address], [place], [place] [address]    Report Status PENDING  Incomplete  Culture, blood (routine x 2)     Status: None (Preliminary result)   Collection Time: 12/12/19  5:20 AM   Specimen: BLOOD LEFT HAND  Result Value Ref Range Status   Specimen Description BLOOD LEFT HAND  Final   Special Requests   Final    BOTTLES DRAWN AEROBIC AND ANAEROBIC Blood Culture adequate volume   Culture   Final    NO GROWTH 2 DAYS Performed at [facility], [place] [address] [place], [place] [address]    Report Status PENDING  Incomplete     Terri Piedra, [place] [facility] [phone] Pager  12/14/2019  2:12 PM

## 2019-12-14 NOTE — Progress Notes (Addendum)
PHARMACY CONSULT NOTE FOR:  OUTPATIENT  PARENTERAL ANTIBIOTIC THERAPY (OPAT)  Indication: enterococcal bacteremia Regimen: ampicillin 12 gm every 24 hours as a continuous infusion  End date: 01/09/20  IV antibiotic discharge orders are pended. To discharging provider:  please sign these orders via discharge navigator,  Select New Orders & click on the button choice - Manage This Unsigned Work.     Thank you for allowing pharmacy to be a part of this patient's care.  Jimmy Footman, PharmD, BCPS, BCIDP Infectious Diseases Clinical Pharmacist Phone: [phone] **Pharmacist phone directory can now be found on [email or web address] ([email or web address]).  Listed under [place].

## 2019-12-14 NOTE — Progress Notes (Signed)
Initial Nutrition Assessment  RD working remotely.  DOCUMENTATION CODES:   Not applicable  INTERVENTION:   -Ensure Enlive po TID, each supplement provides 350 kcal and 20 grams of protein -Magic cup TID with meals, each supplement provides 290 kcal and 9 grams of protein -MVI with minerals daily -Feeding assistance with meals  NUTRITION DIAGNOSIS:   Inadequate oral intake related to altered GI function, poor appetite as evidenced by meal completion < 25%.  GOAL:   Patient will meet greater than or equal to 90% of their needs  MONITOR:   PO intake, Supplement acceptance, Diet advancement, Weight trends, Labs, Skin, I & O's  REASON FOR ASSESSMENT:   Low [place]    ASSESSMENT:   85 year old man admitted from home on 3/4 with complaints of generalized weakness, lethargy, anorexia and abdominal pain for the past 2 weeks.  Per son he has had decreased urinary output but no fevers or chills.  His past medical history significant for a recent admission in December for sepsis secondary to acute cholecystitis status post percutaneous drainage by interventional radiology who has now completed antibiotic therapy, hypertension, stage III chronic kidney disease, right renal cell carcinoma status post right nephrectomy.  CT scan in the emergency department was concerning for left hydroureteronephrosis and signs of an upper UTI.  Admission was requested for further evaluation and management.  Pt admitted with enterococcal sepsis.   3/5- s/p BSE- advanced to dysphagia 1 diet with thin liquids  Reviewed I/O's: +6.4 L x 24 hours and +6 L since admission  UOP: 650 ml x 24 hours  Drain output: 600 ml x 24 hours  Per general surgery notes, pt with perc cholecystectomy tube placed on 09/17/20.   Per chart review, pt very lethargic, not alert enough to take PO's and medications at this time. Staff has been assisting pt with feeding when he is alert enough. Intake has been poor since admission;  noted meal completion 0-10%.  Wt has been stable over the past 6 months.   Palliative care has been following for continued goals of care discussions.   Medications reviewed and inclue 0.9% soidum chloride infusion @ 125 ml/hr.   Labs reviewed: K: 3.4.   Diet Order:   Diet Order            DIET - DYS 1 Room service appropriate? Yes; Fluid consistency: Thin  Diet effective now              EDUCATION NEEDS:   No education needs have been identified at this time  Skin:  Skin Assessment: Skin Integrity Issues: Skin Integrity Issues:: Other (Comment) Other: MASD to rt and lt perineum  Last BM:  12/14/19  Height:   Ht Readings from Last 1 Encounters:  12/10/19 5\' 9"  (1.753 m)    Weight:   Wt Readings from Last 1 Encounters:  12/14/19 82.8 kg    Ideal Body Weight:  72.7 kg  BMI:  Body mass index is 26.96 kg/m.  Estimated Nutritional Needs:   Kcal:  1800-2000  Protein:  85-100 grams  Fluid:  > 1.8 L    Loistine Chance, RD, LDN, [place] Registered Dietitian II Certified Diabetes Care and Education Specialist Please refer to [facility] for RD and/or RD on-call/weekend/after hours pager

## 2019-12-14 NOTE — Progress Notes (Addendum)
Assumed care from previous RN, pt lethargic, responds to voice, opens eyes, tachypneic RR 28, HR 110's on Cardizem drip @ 73ml/hr . Received with a  MEWS score 4. Cholecystostomy tube insertion site draining with  Pus in small amount , dark green/ blackish output noted in bag. Foley Cath draining  Tea colored output. Will monitor.

## 2019-12-14 NOTE — Progress Notes (Signed)
Occupational Therapy Treatment Patient Details Name: Derrick [name] MRN: [id number] DOB: 27-Feb-1932 Today's Date: 12/14/2019    History of present illness Patient is a 86 y/o male who presents with AMS, lethargy and weakness. Admitted with sepsis in the setting of hydroureteronephrosis of left kidney. Also with A-fib with RVR. Pt with recent gallbladder surgery. PMH includes right renal carcinoma s/p right nephrectomy, cholecystitis s/p cholecystectomy and tube placement, CVA, HTN, RA, memory loss, CKD, CAD, PAD, back surgery, and right TM amputation.   OT comments  Pt very lethargic today requiring maximal cueing to open eyes and follow commands. Pt able to follow ~10% of commands today. Pt not able to initiate movements with BUEs/BLEs for ADL tasks. Pt unable to squeeze therapist's hand and unable to move rag to face with maxA. Pt'sNT performing bath and pt totalA for rolling side to side. Pt with neck flexed or favoring the R side and able to come to midline for a few seconds before pt asking OTR to stop.  VSS on 3L O2 at 94% [place]; 112 BPM. Pt would benefit from continued OT skilled services. OT following acutely.   Follow Up Recommendations  SNF    Equipment Recommendations  Other (comment)(to be determined)    Recommendations for Other Services      Precautions / Restrictions Precautions Precautions: Fall Restrictions Weight Bearing Restrictions: No       Mobility Bed Mobility Overal bed mobility: Needs Assistance Bed Mobility: Rolling;Supine to Sit Rolling: Total assist;+2 for physical assistance   Supine to sit: Total assist;+2 for safety/equipment     General bed mobility comments: Pt very lethargic and unable to assist at this time.  Transfers                 General transfer comment: deferred as pt unable to safely perform task and facial grimmacing with any movement    Balance                                           ADL either performed  or assessed with clinical judgement   ADL Overall ADL's : Needs assistance/impaired Eating/Feeding: Total assistance Eating/Feeding Details (indicate cue type and reason): not alert enough for meal tray Grooming: Total assistance;Wash/dry face                               Functional mobility during ADLs: Total assistance;+2 for physical assistance;+2 for safety/equipment General ADL Comments: Pt not able to initiate movements with BUEs/BLEs for ADL tasks. Pt unable to squeeze therapist's hand and unable to move rag to face with maxA. Pt'sNT performing bath and pt totalA for rolling side to side. Pt with neck flexed or favoring the R side and able to come to midline for a few seconds before pt asking OTR to stop.      Vision   Additional Comments: Pt opening eyes consistently for 5 secs or less when addressed by name   Perception     Praxis      Cognition Arousal/Alertness: Lethargic Behavior During Therapy: Flat affect Overall Cognitive Status: Impaired/Different from baseline Area of Impairment: Orientation;Attention;Memory;Following commands;Safety/judgement;Awareness;Problem solving                 Orientation Level: Disoriented to;Place;Time;Situation Current Attention Level: Sustained Memory: Decreased short-term memory Following Commands: Follows one step commands  inconsistently Safety/Judgement: Decreased awareness of deficits;Decreased awareness of safety Awareness: Intellectual Problem Solving: Requires verbal cues;Requires tactile cues;Decreased initiation;Slow processing General Comments: Pt stating "thank you" and "okay"        Exercises     Shoulder Instructions       General Comments VSS on 3L O2 at 94% [place]; 112 BPM,    Pertinent Vitals/ Pain       Pain Assessment: Faces Faces Pain Scale: Hurts a little bit Pain Descriptors / Indicators: Grimacing Pain Intervention(s): Monitored during session;Limited activity within patient's  tolerance  Home Living                                          Prior Functioning/Environment              Frequency  Min 2X/week        Progress Toward Goals  OT Goals(current goals can now be found in the care plan section)  Progress towards OT goals: Not progressing toward goals - comment(goals remain achievable if pt improves)  Acute Rehab OT Goals Patient Stated Goal: none stated OT Goal Formulation: Patient unable to participate in goal setting Time For Goal Achievement: 12/25/19 Potential to Achieve Goals: Fair ADL Goals Pt Will Perform Grooming: with min assist;bed level;sitting Pt Will Perform Upper Body Bathing: with min assist;sitting;bed level Pt Will Transfer to Toilet: with mod assist;stand pivot transfer;bedside commode Pt/caregiver will Perform Home Exercise Program: Increased strength;Both right and left upper extremity;With minimal assist Additional ADL Goal #1: Patient will follow 1 step commands with less than 25% multimodal cuing in order to participate in self care.  Plan Discharge plan remains appropriate    Co-evaluation                 AM-PAC OT "6 Clicks" Daily Activity     Outcome Measure   Help from another person eating meals?: Total Help from another person taking care of personal grooming?: Total Help from another person toileting, which includes using toliet, bedpan, or urinal?: Total Help from another person bathing (including washing, rinsing, drying)?: Total Help from another person to put on and taking off regular upper body clothing?: Total Help from another person to put on and taking off regular lower body clothing?: Total 6 Click Score: 6    End of Session Equipment Utilized During Treatment: Oxygen  OT Visit Diagnosis: Other abnormalities of gait and mobility (R26.89);Muscle weakness (generalized) (M62.81);Other symptoms and signs involving cognitive function   Activity Tolerance Patient limited  by pain;Treatment limited secondary to medical complications (Comment);Patient limited by lethargy   Patient Left in bed;with call bell/phone within reach;with bed alarm set;with nursing/sitter in room   Nurse Communication Mobility status        Time: [id number] OT Time Calculation (min): 19 min  Charges: OT General Charges $OT Visit: 1 Visit OT Treatments $Self Care/Home Management : 8-22 mins  Jefferey Pica, OTR/L Acute Rehabilitation Services Pager: [phone] Office: [phone]    Philipe Laswell C 12/14/2019, 3:25 PM

## 2019-12-14 NOTE — Progress Notes (Signed)
Daily Progress Note   Patient Name: Derrick [name]       Date: 12/14/2019 DOB: 1931/10/15  Age: 86 y.o. MRN#: [id number] Attending Physician: Derrick [name], Derrick [name] Primary Care Physician: Derrick Pretty, MD Admit Date: 12/10/2019  Reason for Consultation/Follow-up: Establishing goals of care  Subjective: Derrick [name] was lethargic on exam today. Did not open his eyes throughout the encounter and would only intermittently respond briefly to questions. He does not feel any better since coming into the hospital. Is not in any pain anywhere. No shortness of breath.   I spoke again to son, Derrick [name], on the phone. He has been updated on plan of care by primary team. He is aware that his father's mentation has not improved much despite IV antibiotics and fluids. The plan is to do repeat imaging of his head if there is no improvement within another 24 hours. He did voice concern about Derrick [name] getting help from staff with feeding because his mother is only there to help around lunch time. I did express that staff will do their best, but if he is not awake enough to eat they don't want to risk him aspirating.    Assessment: 86 y/o gentleman admitted with sepsis secondary to enterococcal bacteremia. He has not had significant improvement in mentation despite 72 hours of fluids and antibiotics. Family remains hopeful for recovery and wants to hold off on discussions until all reversible causes have been ruled out.   Patient Profile/HPI: 86 y.o. male  with past medical history of CAD, PAD, HTN, CKD III, right renal carcinoma s/p right nephrectomy, recent admission in December for sepsis secondary to cholecystitis s/p percutaneous drainage and scheduled for cholecystectomy next month who was admitted on 12/10/2019 with  progressive weakness, anorexia and lethargy. He was found to be bacteremic with enterococcus secondary to urinary source.    Length of Stay: 4  Current Medications: Scheduled Meds:  . Chlorhexidine Gluconate Cloth  6 each Topical Daily  . enoxaparin (LOVENOX) injection  40 mg Subcutaneous Q24H    Continuous Infusions: . sodium chloride 125 mL/hr at 12/14/19 0822  . ampicillin (OMNIPEN) IV 2 g (12/14/19 0807)  . diltiazem (CARDIZEM) infusion 10 mg/hr (12/14/19 0317)    PRN Meds: acetaminophen **OR** acetaminophen, diclofenac Sodium, hydrALAZINE  Physical Exam  General: chronically ill appearing, drowsy bur  arousable, NAD CV: RRR Pulm: normal work of breathing; scattered rhonchi Abd: soft, non-distended, non-tender Neuro: drowsy, does not open eyes, answers simple questions intermittently   Vital Signs: BP (!) 150/87 (BP Location: Right Arm)   Pulse 99   Temp 98.4 F (36.9 C) (Axillary)   Resp 20   Ht 5\' 9"  (1.753 m)   Wt 82.8 kg   SpO2 98%   BMI 26.96 kg/m  SpO2: SpO2: 98 % O2 Device: O2 Device: Nasal Cannula O2 Flow Rate: O2 Flow Rate (L/min): 2 L/min  Intake/output summary:   Intake/Output Summary (Last 24 hours) at 12/14/2019 1032 Last data filed at 12/14/2019 [id number] Gross per 24 hour  Intake 8401.42 ml  Output 1250 ml  Net 7151.42 ml   LBM: Last BM Date: 12/14/19 Baseline Weight: Weight: 72.6 kg Most recent weight: Weight: 82.8 kg       Palliative Assessment/Data: 20%    Flowsheet Rows     Most Recent Value  Intake Tab  Referral Department  Hospitalist  Unit at Time of Referral  Intermediate Care Unit  Palliative Care Primary Diagnosis  Sepsis/Infectious Disease  Date Notified  12/11/19  Palliative Care Type  New Palliative care  Reason for referral  Clarify Goals of Care  Date of Admission  12/10/19  Date first seen by Palliative Care  12/11/19  # of days Palliative referral response time  0 Day(s)  # of days IP prior to Palliative referral  1   Clinical Assessment  Palliative Performance Scale Score  40%  Psychosocial & Spiritual Assessment  Palliative Care Outcomes  Patient/Family meeting held?  Yes  Who was at the meeting?  son  Palliative Care Outcomes  Clarified goals of care      Patient Active Problem List   Diagnosis Date Noted  . Enterococcal bacteremia 12/11/2019  . Cholecystitis, S/P drain 09-2019 12/11/2019  . Goals of care, counseling/discussion   . Palliative care by specialist   . Hydronephrosis of left kidney 12/10/2019  . AMS (altered mental status) 12/10/2019  . Depression 12/10/2019  . Atrial fibrillation with RVR ([place]) 09/19/2019  . Acute cholecystitis 09/18/2019  . E coli bacteremia 09/17/2019  . Sepsis ([place]) 09/16/2019  . Transaminitis 09/16/2019  . Nausea and vomiting 09/16/2019  . RA (rheumatoid arthritis) ([place]) 09/16/2019  . Arm pain 07/07/2019  . A-fib ([place]) 12/12/2018  . Acute blood loss anemia 11/28/2018  . Tachycardia 11/27/2018  . Gross hematuria 11/27/2018  . Acute metabolic encephalopathy [id number]  . Spinal stenosis, lumbar region with neurogenic claudication 11/26/2018  . Ulcer of right foot limited to breakdown of skin ([place])   . PAD (peripheral artery disease) ([place]) 09/23/2017  . Syncope and collapse 09/19/2017  . Osteomyelitis ([place])   . Malaise 09/09/2017  . Anorexia 09/09/2017  . Hypotension 09/09/2017  . Volume depletion 09/09/2017  . AKI (acute kidney injury) ([place]) 09/09/2017  . Hyponatremia 06/12/2017  . COPD (chronic obstructive pulmonary disease) ([place])   . Lacunar infarction ([place])   . Gangrene of toe of right foot ([place])   . Pre-syncope 11/21/2016  . Dyspnea on exertion 03/03/2016  . H/O unilateral Rt nephrectomy 03/03/2016  . Asymmetrical right sensorineural hearing loss 02/24/2016  . Bilateral hearing loss 02/24/2016  . Bilateral impacted cerumen 02/24/2016  . Arrhythmia 11/09/2015  . CAD (coronary artery disease) 11/09/2015  . Abnormal ECG 10/22/2013  .  Dyspnea 10/22/2013  . Chest pain 11/18/2011  . Unstable angina ([place]) 11/18/2011  . Thrombocytopenia ([place]) 11/18/2011  . Anemia 11/18/2011  .  CKD (chronic kidney disease) 11/18/2011  . HTN (hypertension)   . High cholesterol   . GERD (gastroesophageal reflux disease)   . BPH (benign prostatic hyperplasia)     Palliative Care Plan    Recommendations/Plan:  Continue current plan of treatment per medical team  Will continue to follow and re-address GOC if he does not show signs of improvement in 1-2 days  Ensure nursing staff assists with eating and drinking if he is awake enough to take PO   Goals of Care and Additional Recommendations:  Limitations on Scope of Treatment: Full Scope Treatment  Code Status:  Full code  Prognosis:   Unable to determine   Discharge Planning:  To Be Determined  Thank you for allowing the Palliative Medicine Team to assist in the care of this patient.  Total time spent:  30 minutes      Greater than 50%  of this time was spent counseling and coordinating care related to the above assessment and plan.  Modena Nunnery PGY-2    Please contact Palliative MedicineTeam phone at [phone] for questions and concerns between 7 am - 7 pm.   Please see AMION for individual provider pager numbers.

## 2019-12-14 NOTE — Progress Notes (Signed)
   Vital Signs MEWS/VS Documentation      12/14/2019 2000 12/14/2019 2019 12/14/2019 2100 12/14/2019 2200   MEWS Score:  2  1  1  2    MEWS Score Color:  Yellow  Green  Green  Yellow   Resp:  (!) 23  -  -  (!) 24   Pulse:  92  -  -  (!) 105   BP:  138/80  -  137/81  138/68   Temp:  99.3 F (37.4 C)  -  -  -   Level of Consciousness:  -  Alert  -  -      Yellow mews is not an acute change for the patient.  Patient has been tachypneic and tachycardic since admission. Cardizem at 10/hr.  Frequent vitals running every hour.      [place] 12/14/2019,10:15 PM

## 2019-12-14 NOTE — TOC Initial Note (Signed)
Transition of Care [facility]) - Initial/Assessment Note    Patient Details  Name: Derrick [name] MRN: [id number] Date of Birth: Sep 18, 1932  [facility]) CM/SW Contact:    Trula Ore, [place] Phone Number: 12/14/2019, 2:52 PM  Clinical Narrative:                  CSW spoke with patients wife at bedside. CSW informed patients wife about SNF rec. From PT. Patients wife wanted to wait on decision closer to discharge. CSW encouraged  patients wife to be thinking about her decision and the possibility of patient needing SNF placement. Wife appears to be hesitant with SNF rec at this time and hopeful of patients medical improvement. TOC team to continue to follow for discharge planning.        Patient Goals and CMS Choice        Expected Discharge Plan and Services                                                Prior Living Arrangements/Services                       Activities of Daily Living   ADL Screening (condition at time of admission) Patient's cognitive ability adequate to safely complete daily activities?: No Is the patient deaf or have difficulty hearing?: No Does the patient have difficulty concentrating, remembering, or making decisions?: Yes Does the patient have difficulty dressing or bathing?: Yes Does the patient have difficulty walking or climbing stairs?: Yes Weakness of Legs: Both Weakness of Arms/Hands: Both  Permission Sought/Granted                  Emotional Assessment              Admission diagnosis:  AKI (acute kidney injury) ([place]) [N17.9] Atrial fibrillation with RVR ([place]) [I48.91] Sepsis ([place]) [A41.9] Sepsis, due to unspecified organism, unspecified whether acute organ dysfunction present [facility]) [A41.9] Patient Active Problem List   Diagnosis Date Noted  . Enterococcal bacteremia 12/11/2019  . Cholecystitis, S/P drain 09-2019 12/11/2019  . Goals of care, counseling/discussion   . Palliative care by  specialist   . Hydronephrosis of left kidney 12/10/2019  . AMS (altered mental status) 12/10/2019  . Depression 12/10/2019  . Atrial fibrillation with RVR ([place]) 09/19/2019  . Acute cholecystitis 09/18/2019  . E coli bacteremia 09/17/2019  . Sepsis ([place]) 09/16/2019  . Transaminitis 09/16/2019  . Nausea and vomiting 09/16/2019  . RA (rheumatoid arthritis) ([place]) 09/16/2019  . Arm pain 07/07/2019  . A-fib ([place]) 12/12/2018  . Acute blood loss anemia 11/28/2018  . Tachycardia 11/27/2018  . Gross hematuria 11/27/2018  . Acute metabolic encephalopathy [id number]  . Spinal stenosis, lumbar region with neurogenic claudication 11/26/2018  . Ulcer of right foot limited to breakdown of skin ([place])   . PAD (peripheral artery disease) ([place]) 09/23/2017  . Syncope and collapse 09/19/2017  . Osteomyelitis ([place])   . Malaise 09/09/2017  . Anorexia 09/09/2017  . Hypotension 09/09/2017  . Volume depletion 09/09/2017  . AKI (acute kidney injury) ([place]) 09/09/2017  . Hyponatremia 06/12/2017  . COPD (chronic obstructive pulmonary disease) ([place])   . Lacunar infarction ([place])   . Gangrene of toe of right foot ([place])   . Pre-syncope 11/21/2016  . Dyspnea on exertion 03/03/2016  .  H/O unilateral Rt nephrectomy 03/03/2016  . Asymmetrical right sensorineural hearing loss 02/24/2016  . Bilateral hearing loss 02/24/2016  . Bilateral impacted cerumen 02/24/2016  . Arrhythmia 11/09/2015  . CAD (coronary artery disease) 11/09/2015  . Abnormal ECG 10/22/2013  . Dyspnea 10/22/2013  . Chest pain 11/18/2011  . Unstable angina ([place]) 11/18/2011  . Thrombocytopenia ([place]) 11/18/2011  . Anemia 11/18/2011  . CKD (chronic kidney disease) 11/18/2011  . HTN (hypertension)   . High cholesterol   . GERD (gastroesophageal reflux disease)   . BPH (benign prostatic hyperplasia)    PCP:  [name], MD Pharmacy:   [place], [place] - [address] [place]  [address] Phone: [phone] Fax: [phone]     Social Determinants of Health (SDOH) Interventions    Readmission Risk Interventions Readmission Risk Prevention Plan 09/22/2019  Transportation Screening Complete  Medication Review Press photographer) Complete  PCP or Specialist appointment within 3-5 days of discharge Complete  HRI or Home Care Consult Complete  SW Recovery Care/Counseling Consult Complete  Palliative Care Screening Not [place] Complete  Some recent data might be hidden

## 2019-12-14 NOTE — Progress Notes (Signed)
Wife at bedside, wanting pt to eat but because of pts mentation this RN explained to the wife that pt is very sleepy/lethargic  and will not be able to swallow/tolerate feeding and  is  a very high risk of aspiration. Wife was also told that if pt is awake enough to follow instructions we will feed him.  Wife asked this RN several times if she could feed the pt.It was reiterated and was reeducated for the risk of pt being aspirated.Wife seems upset. Will monitor.

## 2019-12-15 ENCOUNTER — Inpatient Hospital Stay: Payer: Self-pay

## 2019-12-15 ENCOUNTER — Inpatient Hospital Stay (HOSPITAL_COMMUNITY): Payer: Medicare Other

## 2019-12-15 LAB — CBC
HCT: 32.7 % — ABNORMAL LOW (ref 39.0–52.0)
Hemoglobin: 10 g/dL — ABNORMAL LOW (ref 13.0–17.0)
MCH: 26.1 pg (ref 26.0–34.0)
MCHC: 30.6 g/dL (ref 30.0–36.0)
MCV: 85.4 fL (ref 80.0–100.0)
Platelets: 379 10*3/uL (ref 150–400)
RBC: 3.83 MIL/uL — ABNORMAL LOW (ref 4.22–5.81)
RDW: 23.2 % — ABNORMAL HIGH (ref 11.5–15.5)
WBC: 15.6 10*3/uL — ABNORMAL HIGH (ref 4.0–10.5)
nRBC: 0 % (ref 0.0–0.2)

## 2019-12-15 LAB — BASIC METABOLIC PANEL
Anion gap: 10 (ref 5–15)
BUN: 15 mg/dL (ref 8–23)
CO2: 17 mmol/L — ABNORMAL LOW (ref 22–32)
Calcium: 8.1 mg/dL — ABNORMAL LOW (ref 8.9–10.3)
Chloride: 117 mmol/L — ABNORMAL HIGH (ref 98–111)
Creatinine, Ser: 0.68 mg/dL (ref 0.61–1.24)
GFR calc Af Amer: >60 mL/min (ref >60)
GFR calc non Af Amer: >60 mL/min (ref >60)
Glucose, Bld: 160 mg/dL — ABNORMAL HIGH (ref 70–99)
Potassium: 3.3 mmol/L — ABNORMAL LOW (ref 3.5–5.1)
Sodium: 144 mmol/L (ref 135–145)

## 2019-12-15 LAB — GLUCOSE, CAPILLARY: Glucose-Capillary: 139 mg/dL — ABNORMAL HIGH (ref 70–99)

## 2019-12-15 MED ORDER — SODIUM CHLORIDE 0.9% FLUSH
10.0000 mL | Freq: Two times a day (BID) | INTRAVENOUS | Status: DC
  Administered 2019-12-16 – 2019-12-18 (×2): 10 mL

## 2019-12-15 MED ORDER — POTASSIUM CHLORIDE 20 MEQ PO PACK: 40.0000 meq | PACK | ORAL | Status: DC

## 2019-12-15 MED ORDER — SODIUM CHLORIDE 0.9% FLUSH
10.0000 mL | Freq: Two times a day (BID) | INTRAVENOUS | Status: DC
  Administered 2019-12-15 – 2019-12-16 (×3): 10 mL
  Administered 2019-12-17: 30 mL
  Administered 2019-12-18: 10 mL

## 2019-12-15 MED ORDER — POTASSIUM CHLORIDE CRYS ER 20 MEQ PO TBCR: 40.0000 meq | EXTENDED_RELEASE_TABLET | ORAL | Status: DC

## 2019-12-15 MED ORDER — POTASSIUM CHLORIDE CRYS ER 10 MEQ PO TBCR: 40.0000 meq | EXTENDED_RELEASE_TABLET | ORAL | Status: DC

## 2019-12-15 MED ORDER — SODIUM CHLORIDE 0.9% FLUSH: 10.0000 mL | INTRAVENOUS | Status: DC | PRN

## 2019-12-15 MED ORDER — POTASSIUM CHLORIDE CRYS ER 20 MEQ PO TBCR
40.0000 meq | EXTENDED_RELEASE_TABLET | Freq: Once | ORAL | Status: DC
  Filled 2019-12-15: qty 4

## 2019-12-15 MED ORDER — POTASSIUM CHLORIDE 10 MEQ/100ML IV SOLN
10.0000 meq | INTRAVENOUS | Status: AC
  Administered 2019-12-15 (×4): 10 meq via INTRAVENOUS
  Filled 2019-12-15 (×5): qty 100

## 2019-12-15 MED ORDER — FUROSEMIDE 10 MG/ML IJ SOLN
40.0000 mg | Freq: Two times a day (BID) | INTRAMUSCULAR | Status: DC
  Administered 2019-12-15 – 2019-12-18 (×7): 40 mg via INTRAVENOUS
  Filled 2019-12-15 (×7): qty 4

## 2019-12-15 MED ORDER — POTASSIUM CHLORIDE 20 MEQ PO PACK
40.0000 meq | PACK | Freq: Once | ORAL | Status: DC
  Filled 2019-12-15: qty 2

## 2019-12-15 NOTE — Progress Notes (Signed)
Daily Progress Note   Patient Name: Derrick [name]       Date: 12/15/2019 DOB: [birth year]  Age: 86 y.o. MRN#: [id number] Attending Physician: Isaac Bliss, Olam Idler* Primary Care Physician: [name], MD Admit Date: 12/10/2019  Reason for Consultation/Follow-up: Establishing goals of care  Subjective: Went to check on Derrick [name] this morning. Received report from nurse that he was a bit more interactive with her today. Has been eating some. Able to take PO meds. He had some increased work of breathing this morning. Chest x-ray consistent with pulmonary edema and was started on IV lasix. On my evaluation he seems to be breathing more comfortably. He will briefly wake up to answer basic questions. He denies headache, shortness of breath or pain anywhere.    Assessment: 86 y/o gentleman admitted with sepsis secondary to enterococcal bacteremia. He has shown some signs of clinical improvement, but remains encephalopathic. Family remains hopeful for recovery and wants to hold off on discussions until all reversible causes have been ruled out. He is scheduled for repeat head CT later today.    Patient Profile/HPI: 86 y.o.malewith past medical history of CAD, PAD, HTN, CKD III, right renal carcinoma s/p right nephrectomy, recent admission in December for sepsis secondary to cholecystitis s/p percutaneous drainage and scheduled for cholecystectomy next monthwho was admitted on 3/4/2021with progressive weakness, anorexia and lethargy.He was found to be bacteremic with enterococcus secondary to urinary source.   Length of Stay: 5  Current Medications: Scheduled Meds:  . Chlorhexidine Gluconate Cloth  6 each Topical Daily  . enoxaparin (LOVENOX) injection  40 mg Subcutaneous Q24H  . feeding  supplement (ENSURE ENLIVE)  237 mL Oral TID BM  . furosemide  40 mg Intravenous Q12H  . multivitamin with minerals  1 tablet Oral Daily  . potassium chloride  40 mEq Oral Q4H  . sodium chloride flush  10-40 mL Intracatheter Q12H    Continuous Infusions: . sodium chloride 250 mL (12/15/19 0028)  . ampicillin (OMNIPEN) IV 2 g (12/15/19 [id number])  . diltiazem (CARDIZEM) infusion 15 mg/hr (12/15/19 0903)    PRN Meds: sodium chloride, acetaminophen **OR** acetaminophen, diclofenac Sodium, hydrALAZINE, sodium chloride flush  Physical Exam        General: drowsy but arousable, NAD CV: RRR Pulm: normal work of breathing; decreased breath sounds at bases  Abd: BS+; abdomen soft, non-distended, non-tender Neuro: awake, disoriented, answers simple questions. Not able to perform neuro exam due to mentation. Will not move either upper extremity.   Vital Signs: BP 138/71   Pulse 95   Temp 99.3 F (37.4 C) (Axillary)   Resp (!) 21   Ht 5\' 9"  (1.753 m)   Wt 85.5 kg   SpO2 95%   BMI 27.85 kg/m  SpO2: SpO2: 95 % O2 Device: O2 Device: Nasal Cannula O2 Flow Rate: O2 Flow Rate (L/min): 2 L/min  Intake/output summary:   Intake/Output Summary (Last 24 hours) at 12/15/2019 1115 Last data filed at 12/15/2019 0919 Gross per 24 hour  Intake 4264.07 ml  Output 1300 ml  Net 2964.07 ml   LBM: Last BM Date: 12/15/19 Baseline Weight: Weight: 72.6 kg Most recent weight: Weight: 85.5 kg       Palliative Assessment/Data: 30%    Flowsheet Rows     Most Recent Value  Intake Tab  Referral Department  Hospitalist  Unit at Time of Referral  Intermediate Care Unit  Palliative Care Primary Diagnosis  Sepsis/Infectious Disease  Date Notified  12/11/19  Palliative Care Type  New Palliative care  Reason for referral  Clarify Goals of Care  Date of Admission  12/10/19  Date first seen by Palliative Care  12/11/19  # of days Palliative referral response time  0 Day(s)  # of days IP prior to Palliative  referral  1  Clinical Assessment  Palliative Performance Scale Score  40%  Psychosocial & Spiritual Assessment  Palliative Care Outcomes  Patient/Family meeting held?  Yes  Who was at the meeting?  son  Palliative Care Outcomes  Clarified goals of care      Patient Active Problem List   Diagnosis Date Noted  . Enterococcal bacteremia 12/11/2019  . Cholecystitis, S/P drain 09-2019 12/11/2019  . Goals of care, counseling/discussion   . Palliative care by specialist   . Hydronephrosis of left kidney 12/10/2019  . AMS (altered mental status) 12/10/2019  . Depression 12/10/2019  . Atrial fibrillation with RVR ([place]) 09/19/2019  . Acute cholecystitis 09/18/2019  . E coli bacteremia 09/17/2019  . Sepsis ([place]) 09/16/2019  . Transaminitis 09/16/2019  . Nausea and vomiting 09/16/2019  . RA (rheumatoid arthritis) ([place]) 09/16/2019  . Arm pain 07/07/2019  . A-fib ([place]) 12/12/2018  . Acute blood loss anemia 11/28/2018  . Tachycardia 11/27/2018  . Gross hematuria 11/27/2018  . Acute metabolic encephalopathy [id number]  . Spinal stenosis, lumbar region with neurogenic claudication 11/26/2018  . Ulcer of right foot limited to breakdown of skin ([place])   . PAD (peripheral artery disease) ([place]) 09/23/2017  . Syncope and collapse 09/19/2017  . Osteomyelitis ([place])   . Malaise 09/09/2017  . Anorexia 09/09/2017  . Hypotension 09/09/2017  . Volume depletion 09/09/2017  . AKI (acute kidney injury) ([place]) 09/09/2017  . Hyponatremia 06/12/2017  . COPD (chronic obstructive pulmonary disease) ([place])   . Lacunar infarction ([place])   . Gangrene of toe of right foot ([place])   . Pre-syncope 11/21/2016  . Dyspnea on exertion 03/03/2016  . H/O unilateral Rt nephrectomy 03/03/2016  . Asymmetrical right sensorineural hearing loss 02/24/2016  . Bilateral hearing loss 02/24/2016  . Bilateral impacted cerumen 02/24/2016  . Arrhythmia 11/09/2015  . CAD (coronary artery disease) 11/09/2015  . Abnormal ECG  10/22/2013  . Dyspnea 10/22/2013  . Chest pain 11/18/2011  . Unstable angina ([place]) 11/18/2011  . Thrombocytopenia ([place]) 11/18/2011  . Anemia 11/18/2011  .  CKD (chronic kidney disease) 11/18/2011  . HTN (hypertension)   . High cholesterol   . GERD (gastroesophageal reflux disease)   . BPH (benign prostatic hyperplasia)     Palliative Care Plan    Recommendations/Plan:  Continue current plan of treatment per medical team  Will continue to follow and re-address GOC if he has not shown improvement despite treating all reversible causes  Ensure nursing staff assists with eating and drinking when he is awake enough to take PO   Goals of Care and Additional Recommendations:  Limitations on Scope of Treatment: Full Scope Treatment  Code Status:  Full code  Prognosis:   Unable to determine   Discharge Planning:  To Be Determined   Thank you for allowing the Palliative Medicine Team to assist in the care of this patient.  Total time spent:  30 minutes      Greater than 50%  of this time was spent counseling and coordinating care related to the above assessment and plan.  Modena Nunnery PGY-2  Please contact Palliative MedicineTeam phone at [phone] for questions and concerns between 7 am - 7 pm.   Please see AMION for individual provider pager numbers.

## 2019-12-15 NOTE — Progress Notes (Addendum)
   Vital Signs MEWS/VS Documentation      12/15/2019 0451 12/15/2019 0600 12/15/2019 0700 12/15/2019 0800   MEWS Score:  2  1  2  5    MEWS Score Color:  Yellow  Green  Yellow  Red   Resp:  (!) 24  (!) 22  (!) 23  (!) 27   Pulse:  (!) 109  97  (!) 106  (!) 116   BP:  --  136/81  131/73  (!) 160/83   Temp:  98.3 F (36.8 C)  --  --  99.3 F (37.4 C)   O2 Device:  --  --  --  Nasal Cannula   O2 Flow Rate (L/min):  --  --  --  2 L/min   Level of Consciousness:  --  --  --  Responds to Voice      BP is elevated, respiration rate and heart rate remain elevated. Temp is 99.3 despite tylenol. MD made aware. Will follow [place] MEWS protocol      Samuella Cota 12/15/2019,8:14 AM

## 2019-12-15 NOTE — Consult Note (Signed)
   [facility] CM Inpatient Consult   12/15/2019  Derrick [name] 05-23-1932 [id number]   Patient screened for high risk score for unplanned readmission score of 32% [extreme] and  to check if potential [name] services are needed.  Review of patient's medical record reveals patient is currently being recommended for a skilled nursing facility stay.  Currently decision is in the making. Patient continuing for work up getting a PICC line noted. Patient with a hx of [place] Management services in the past.  Primary Care Provider is [name], MD this office is listed to  provide the transition of care follow up.  Pharmacy is: Listed as [name]    Plan:  Continue to follow progress and disposition to assess for post hospital care management needs.    Please place a [facility] Care Management consult as appropriate and for questions contact:   [name], RN BSN [name] Liaison  [phone] business mobile phone Toll free office [phone]  Fax number: [phone] [name]

## 2019-12-15 NOTE — Progress Notes (Signed)
Pts wrist watch returned to wife [name].

## 2019-12-15 NOTE — Progress Notes (Signed)
Assume care from previous RN. Pt more awake than yesterday.  IVTeam in the room to place PICC line.

## 2019-12-15 NOTE — Progress Notes (Addendum)
PROGRESS NOTE    Derrick [name]  [id number] DOB: 07-24-1932 DOA: 12/10/2019 PCP: [name], MD     Brief Narrative:  86 year old man admitted from home on 3/4 with complaints of generalized weakness, lethargy, anorexia and abdominal pain for the past 2 weeks.  Per son he has had decreased urinary output but no fevers or chills.  His past medical history significant for a recent admission in December for sepsis secondary to acute cholecystitis status post percutaneous drainage by interventional radiology who has now completed antibiotic therapy, hypertension, stage III chronic kidney disease, right renal cell carcinoma status post right nephrectomy.  CT scan in the emergency department was concerning for left hydroureteronephrosis and signs of an upper UTI.  Admission was requested for further evaluation and management.   Assessment & Plan:   Principal Problem:   Enterococcal bacteremia Active Problems:   HTN (hypertension)   Anemia   CKD (chronic kidney disease)   Sepsis (HCC)   RA (rheumatoid arthritis) (HCC)   Atrial fibrillation with RVR (HCC)   Hydronephrosis of left kidney   AMS (altered mental status)   Depression   Cholecystitis, S/P drain 09-2019   Goals of care, counseling/discussion   Palliative care by specialist   Enterococcal sepsis -Thought urinary source as urine cx also positive. -Plan to continue IV ampicillin, per ID plan to continue until 4/3. -Appreciate ID following. -Afebrile overnight.  -Leukocytosis worsening again, now up to 15.6. -Check CXR as he has increased work of breathing. -Urology is recommending repeat renal US 3/7: No residual hydroureteronephrosis, clot in the bladder lumen with distended bladder despite foley catheter. He has a h/o prostate cancer. GU is following; appreciate their input and recommendations. -2D ECHO without vegetations. -Repeat BX remain negative. If they turn positive may need to consider TEE to r/o  endocarditis. -Given advanced age and multiple medical comorbidities, appreciate palliative care following as well. Per son, full scope of treatment for now.  Acute metabolic encephalopathy -Likely due to sepsis. -CT scan of the head is negative for CVA on admission 3/4. -Altho improved, I am concerned that he may have suffered an ischemic event as he is unable to move his right arm and his mental state is still not at his baseline per family report. -Repeat CT Head today.  Atrial fibrillation with RVR -In the setting of sepsis, heart rate has increased to 120s. -He remains on a Cardizem drip, dose has been up-titrated today, as above check CXR as I suspect his rapid rates are in response to acute lung pathology. -Can consider transitioning to p.o. once his oral intake is improved. -He is not on anticoagulation at home despite high CHADSVASC score, suspect on account of age, frailty and deconditioning.  Recent history of cholecystitis with percutaneous drainage -Scheduled for OP cholecystectomy 4/5. -Appreciate surgical consultation. -Follow LFTs (at baseline).  Rheumatoid arthritis -At home on prednisone and Plaquenil. -No need for stress dose steroids as he is currently normo to hypertensive.  History of right renal cell carcinoma -Status post nephrectomy  Anemia of Chronic Disease -Due to RA. -Hb has drifted down to 9.8 from 11.1 on admission, suspect acute component to be dilutional anemia. -Follow and transfuse if <7.0.   Addendum: CXR with patchy bilateral infiltrates. Likely pulmonary edema. NSL IV, DC IVF, start lasix 40 mg IV BID.   DVT prophylaxis: Lovenox Code Status: Full code Family Communication: Discussed with son via phone on 3/8. Disposition Plan: To be determined, but likely home with [facility] per son's  wishes. May need SNF pending completion of acute work up and family's wishes on scope of care.  Consultants:   Urology  IR  Procedures:    None  Antimicrobials:  Anti-infectives (From admission, onward)   Start     Dose/Rate Route Frequency Ordered Stop   12/11/19 1200  cefTRIAXone (ROCEPHIN) 1 g in sodium chloride 0.9 % 100 mL IVPB  Status:  Discontinued     1 g 200 mL/hr over 30 Minutes Intravenous  Once 12/10/19 1831 12/11/19 0143   12/11/19 0800  ampicillin (OMNIPEN) 2 g in sodium chloride 0.9 % 100 mL IVPB     2 g 300 mL/hr over 20 Minutes Intravenous Every 4 hours 12/11/19 0143 01/09/20 2359   12/11/19 0200  ampicillin (OMNIPEN) 2 g in sodium chloride 0.9 % 100 mL IVPB     2 g 300 mL/hr over 20 Minutes Intravenous NOW 12/11/19 0143 12/11/19 0401   12/10/19 1145  cefTRIAXone (ROCEPHIN) 2 g in sodium chloride 0.9 % 100 mL IVPB     2 g 200 mL/hr over 30 Minutes Intravenous  Once 12/10/19 1138 12/10/19 1240   12/10/19 1145  metroNIDAZOLE (FLAGYL) IVPB 500 mg     500 mg 100 mL/hr over 60 Minutes Intravenous  Once 12/10/19 1139 12/10/19 1254       Subjective: In bed, awake, seems distressed, says his right hip hurts, has increased WOB.  Objective: Vitals:   12/15/19 0819 12/15/19 0830 12/15/19 0845 12/15/19 0900  BP: (!) 151/95 140/88 (!) 142/86 (!) 141/82  Pulse: (!) 48 (!) 109 97 (!) 103  Resp: (!) 30 (!) 25 (!) 27 (!) 28  Temp:      TempSrc:      SpO2: 95% 94% 95% 94%  Weight:      Height:        Intake/Output Summary (Last 24 hours) at 12/15/2019 0948 Last data filed at 12/15/2019 0919 Gross per 24 hour  Intake 4264.07 ml  Output 1300 ml  Net 2964.07 ml   Filed Weights   12/13/19 0458 12/14/19 0341 12/15/19 0451  Weight: 76 kg 82.8 kg 85.5 kg    Examination:  General exam: awake, oriented to person only. Respiratory system: Decreased bilateral BS, increased respiratory effort. Cardiovascular system:RRR. No murmurs, rubs, gallops. Gastrointestinal system: Abdomen is nondistended, soft and nontender. No organomegaly or masses felt. Normal bowel sounds heard. Central nervous system: Difficult  to assess given mental status, but when asked to move right hand/arm, is unable to. Extremities: No C/C/E, +pedal pulses Psychiatry: Unable to fully assess given current mental status    Data Reviewed: I have personally reviewed following labs and imaging studies  CBC: Recent Labs  Lab 12/10/19 1138 12/10/19 1821 12/11/19 0330 12/12/19 0514 12/13/19 0344 12/14/19 0421 12/15/19 0405  WBC 18.6*   < > 15.9* 11.5* 10.0 11.6* 15.6*  NEUTROABS 16.1*  --   --   --   --   --   --   HGB 11.8*   < > 10.4* 9.0* 9.4* 9.8* 10.0*  HCT 37.9*   < > 33.9* 29.9* 30.5* 32.1* 32.7*  MCV 84.4   < > 84.8 86.2 85.0 85.6 85.4  PLT 379   < > 331 346 349 379 379   < > = values in this interval not displayed.   Basic Metabolic Panel: Recent Labs  Lab 12/10/19 1138 12/10/19 1821 12/11/19 0330 12/12/19 0514 12/13/19 0344 12/14/19 0421 12/15/19 0405  NA   < >  --  138 141 142 142 144  K   < >  --  4.1 3.7 3.5 3.4* 3.3*  CL   < >  --  109 116* 115* 116* 117*  CO2   < >  --  17* 15* 15* 17* 17*  GLUCOSE   < >  --  125* 118* 119* 115* 160*  BUN   < >  --  34* 25* 18 14 15   CREATININE   < > 1.24 1.02 0.97 0.80 0.76 0.68  CALCIUM   < >  --  8.3* 8.2* 8.1* 8.1* 8.1*  MG  --  2.3  --   --   --   --   --   PHOS  --  3.7  --   --   --   --   --    < > = values in this interval not displayed.   GFR: Estimated Creatinine Clearance: 73.1 mL/min (by C-G formula based on SCr of 0.68 mg/dL). Liver Function Tests: Recent Labs  Lab 12/10/19 1138 12/11/19 0330 12/12/19 0514 12/14/19 0421  AST 63* 41 32 43*  ALT 75* 51* 40 48*  ALKPHOS 126 140* 101 105  BILITOT 1.1 1.0 0.6 0.7  PROT 7.0 5.7* 5.3* 5.4*  ALBUMIN 2.6* 2.0* 1.8* 1.6*   No results for input(s): LIPASE, AMYLASE in the last 168 hours. No results for input(s): AMMONIA in the last 168 hours. Coagulation Profile: Recent Labs  Lab 12/10/19 1138  INR 1.2   Cardiac Enzymes: No results for input(s): CKTOTAL, CKMB, CKMBINDEX, TROPONINI in  the last 168 hours. BNP (last 3 results) No results for input(s): PROBNP in the last 8760 hours. HbA1C: No results for input(s): HGBA1C in the last 72 hours. CBG: Recent Labs  Lab 12/11/19 0810 12/14/19 0623 12/15/19 0449  GLUCAP 105* 99 139*   Lipid Profile: No results for input(s): CHOL, HDL, LDLCALC, TRIG, CHOLHDL, LDLDIRECT in the last 72 hours. Thyroid Function Tests: No results for input(s): TSH, T4TOTAL, FREET4, T3FREE, THYROIDAB in the last 72 hours. Anemia Panel: No results for input(s): VITAMINB12, FOLATE, FERRITIN, TIBC, IRON, RETICCTPCT in the last 72 hours. Urine analysis:    Component Value Date/Time   COLORURINE AMBER (A) 12/10/2019 1628   APPEARANCEUR HAZY (A) 12/10/2019 1628   LABSPEC 1.028 12/10/2019 1628   PHURINE 5.0 12/10/2019 1628   GLUCOSEU NEGATIVE 12/10/2019 1628   HGBUR NEGATIVE 12/10/2019 1628   BILIRUBINUR NEGATIVE 12/10/2019 1628   KETONESUR NEGATIVE 12/10/2019 1628   PROTEINUR 100 (A) 12/10/2019 1628   UROBILINOGEN 1.0 11/27/2013 0947   NITRITE NEGATIVE 12/10/2019 1628   LEUKOCYTESUR NEGATIVE 12/10/2019 1628   Sepsis Labs: @LABRCNTIP (procalcitonin:4,lacticidven:4)  ) Recent Results (from the past 240 hour(s))  Blood Culture (routine x 2)     Status: Abnormal   Collection Time: 12/10/19 11:30 AM   Specimen: BLOOD  Result Value Ref Range Status   Specimen Description BLOOD RIGHT ANTECUBITAL  Final   Special Requests   Final    BOTTLES DRAWN AEROBIC AND ANAEROBIC Blood Culture adequate volume   Culture  Setup Time   Final    IN BOTH AEROBIC AND ANAEROBIC BOTTLES CRITICAL RESULT CALLED TO, READ BACK BY AND VERIFIED WITH: K AMEND PHARMD 12/11/19 0125 JDW GRAM POSITIVE COCCI Performed at [facility], [place] [address], [place], [place] [address]    Culture ENTEROCOCCUS FAECALIS (A)  Final   Report Status 12/13/2019 FINAL  Final   Organism ID, Bacteria ENTEROCOCCUS FAECALIS  Final  Susceptibility   Enterococcus faecalis - MIC*     AMPICILLIN <=2 SENSITIVE Sensitive     VANCOMYCIN 1 SENSITIVE Sensitive     GENTAMICIN SYNERGY SENSITIVE Sensitive     * ENTEROCOCCUS FAECALIS  Blood Culture ID Panel (Reflexed)     Status: Abnormal   Collection Time: 12/10/19 11:30 AM  Result Value Ref Range Status   Enterococcus species DETECTED (A) NOT DETECTED Final    Comment: CRITICAL RESULT CALLED TO, READ BACK BY AND VERIFIED WITH: K AMEND PHARMD 12/11/19 0125 JDW    Vancomycin resistance NOT DETECTED NOT DETECTED Final   Listeria monocytogenes NOT DETECTED NOT DETECTED Final   Staphylococcus species NOT DETECTED NOT DETECTED Final   Staphylococcus aureus (BCID) NOT DETECTED NOT DETECTED Final   Streptococcus species NOT DETECTED NOT DETECTED Final   Streptococcus agalactiae NOT DETECTED NOT DETECTED Final   Streptococcus pneumoniae NOT DETECTED NOT DETECTED Final   Streptococcus pyogenes NOT DETECTED NOT DETECTED Final   Acinetobacter baumannii NOT DETECTED NOT DETECTED Final   Enterobacteriaceae species NOT DETECTED NOT DETECTED Final   Enterobacter cloacae complex NOT DETECTED NOT DETECTED Final   Escherichia coli NOT DETECTED NOT DETECTED Final   Klebsiella oxytoca NOT DETECTED NOT DETECTED Final   Klebsiella pneumoniae NOT DETECTED NOT DETECTED Final   Proteus species NOT DETECTED NOT DETECTED Final   Serratia marcescens NOT DETECTED NOT DETECTED Final   Haemophilus influenzae NOT DETECTED NOT DETECTED Final   Neisseria meningitidis NOT DETECTED NOT DETECTED Final   Pseudomonas aeruginosa NOT DETECTED NOT DETECTED Final   Candida albicans NOT DETECTED NOT DETECTED Final   Candida glabrata NOT DETECTED NOT DETECTED Final   Candida krusei NOT DETECTED NOT DETECTED Final   Candida parapsilosis NOT DETECTED NOT DETECTED Final   Candida tropicalis NOT DETECTED NOT DETECTED Final    Comment: Performed at [facility], [place]. [address] [place], [place] [address]  Blood Culture (routine x 2)     Status: Abnormal    Collection Time: 12/10/19 11:37 AM   Specimen: BLOOD LEFT HAND  Result Value Ref Range Status   Specimen Description BLOOD LEFT HAND  Final   Special Requests   Final    BOTTLES DRAWN AEROBIC AND ANAEROBIC Blood Culture adequate volume   Culture  Setup Time   Final    IN BOTH AEROBIC AND ANAEROBIC BOTTLES GRAM POSITIVE COCCI IN CHAINS CRITICAL VALUE NOTED.  VALUE IS CONSISTENT WITH PREVIOUSLY REPORTED AND CALLED VALUE.    Culture (A)  Final    ENTEROCOCCUS FAECALIS SUSCEPTIBILITIES PERFORMED ON PREVIOUS CULTURE WITHIN THE LAST 5 DAYS. Performed at [facility], [place] [address] [place], [place] [address]    Report Status 12/13/2019 FINAL  Final  Urine culture     Status: Abnormal   Collection Time: 12/10/19  1:50 PM   Specimen: In/Out Cath Urine  Result Value Ref Range Status   Specimen Description IN/OUT CATH URINE  Final   Special Requests   Final    NONE Performed at [facility], [place] [address], [place],  [address]    Culture >=100,000 COLONIES/mL ENTEROCOCCUS FAECALIS (A)  Final   Report Status 12/12/2019 FINAL  Final   Organism ID, Bacteria ENTEROCOCCUS FAECALIS (A)  Final      Susceptibility   Enterococcus faecalis - MIC*    AMPICILLIN <=2 SENSITIVE Sensitive     NITROFURANTOIN <=16 SENSITIVE Sensitive     VANCOMYCIN 1 SENSITIVE Sensitive     * >=100,000  COLONIES/mL ENTEROCOCCUS FAECALIS  SARS CORONAVIRUS 2 (TAT 6-24 HRS) Nasopharyngeal Nasopharyngeal Swab     Status: None   Collection Time: 12/10/19  7:02 PM   Specimen: Nasopharyngeal Swab  Result Value Ref Range Status   SARS Coronavirus 2 NEGATIVE NEGATIVE Final    Comment: (NOTE) SARS-CoV-2 target nucleic acids are NOT DETECTED. The SARS-CoV-2 RNA is generally detectable in upper and lower respiratory specimens during the acute phase of infection. Negative results do not preclude SARS-CoV-2 infection, do not rule out co-infections with other pathogens, and should not be used as the sole basis  for treatment or other patient management decisions. Negative results must be combined with clinical observations, patient history, and epidemiological information. The expected result is Negative. Fact Sheet for Patients: SugarRoll.be Fact Sheet for Healthcare Providers: https://www.woods-mathews.com/ This test is not yet approved or cleared by the [place] FDA and  has been authorized for detection and/or diagnosis of SARS-CoV-2 by FDA under an Emergency Use Authorization (EUA). This EUA will remain  in effect (meaning this test can be used) for the duration of the COVID-19 declaration under Section 56 4(b)(1) of the Act, 21 U.S.C. section 360bbb-3(b)(1), unless the authorization is terminated or revoked sooner. Performed at [facility], [place] [address] [place], [place] [address]   Culture, blood (routine x 2)     Status: None (Preliminary result)   Collection Time: 12/12/19  5:16 AM   Specimen: BLOOD RIGHT HAND  Result Value Ref Range Status   Specimen Description BLOOD RIGHT HAND  Final   Special Requests   Final    BOTTLES DRAWN AEROBIC AND ANAEROBIC Blood Culture adequate volume   Culture   Final    NO GROWTH 2 DAYS Performed at [facility], [place] [address] [place], [place] [address]    Report Status PENDING  Incomplete  Culture, blood (routine x 2)     Status: None (Preliminary result)   Collection Time: 12/12/19  5:20 AM   Specimen: BLOOD LEFT HAND  Result Value Ref Range Status   Specimen Description BLOOD LEFT HAND  Final   Special Requests   Final    BOTTLES DRAWN AEROBIC AND ANAEROBIC Blood Culture adequate volume   Culture   Final    NO GROWTH 2 DAYS Performed at [facility], [place] [address] [place], [place] [address]    Report Status PENDING  Incomplete         Radiology Studies: No results found.      Scheduled Meds: . Chlorhexidine Gluconate Cloth  6 each Topical Daily  .  enoxaparin (LOVENOX) injection  40 mg Subcutaneous Q24H  . feeding supplement (ENSURE ENLIVE)  237 mL Oral TID BM  . multivitamin with minerals  1 tablet Oral Daily  . potassium chloride  40 mEq Oral Once  . potassium chloride  40 mEq Oral Q4H   Continuous Infusions: . sodium chloride 125 mL/hr at 12/15/19 0602  . sodium chloride 250 mL (12/15/19 0028)  . ampicillin (OMNIPEN) IV 2 g (12/15/19 [id number])  . diltiazem (CARDIZEM) infusion 15 mg/hr (12/15/19 0903)     LOS: 5 days    Time spent: 35 minutes. Greater than 50% of this time was spent in direct contact with the patient, coordinating care and discussing relevant ongoing clinical issues.    Lelon Frohlich, MD [facility] Pager [phone]  If 7PM-7AM, please contact night-coverage [email or web address] Password [facility] 12/15/2019, 9:48 AM

## 2019-12-15 NOTE — Progress Notes (Signed)
Peripherally Inserted Central Catheter/Midline Placement  The IV Nurse has discussed with the patient and/or persons authorized to consent for the patient, the purpose of this procedure and the potential benefits and risks involved with this procedure.  The benefits include less needle sticks, lab draws from the catheter, and the patient may be discharged home with the catheter. Risks include, but not limited to, infection, bleeding, blood clot (thrombus formation), and puncture of an artery; nerve damage and irregular heartbeat and possibility to perform a PICC exchange if needed/ordered by physician.  Alternatives to this procedure were also discussed.  Bard Power PICC patient education guide, fact sheet on infection prevention and patient information card has been provided to patient /or left at bedside.    PICC/Midline Placement Documentation  PICC Double Lumen 12/15/19 PICC Right Brachial 45 cm 0 cm (Active)  Indication for Insertion or Continuance of Line Vasoactive infusions 12/15/19 1140  Exposed Catheter (cm) 0 cm 12/15/19 1140  Site Assessment Clean;Dry;Intact 12/15/19 1140  Lumen #1 Status Flushed;Blood return noted 12/15/19 1140  Lumen #2 Status Flushed;Blood return noted 12/15/19 1140  Dressing Type Transparent 12/15/19 1140  Dressing Status Clean;Dry;Intact;Antimicrobial disc in place;Other (Comment) 12/15/19 1140  Dressing Intervention New dressing 12/15/19 1140  Dressing Change Due 01-09-20 12/15/19 1140    Telephone consent signed by Foye Clock Albarece 12/15/2019, 11:41 AM

## 2019-12-15 NOTE — Progress Notes (Signed)
Physical Therapy Treatment Patient Details Name: Derrick [name] MRN: [id number] DOB: [birth year] Today's Date: 12/15/2019    History of Present Illness Patient is a 86 y/o male who presents with AMS, lethargy and weakness. Admitted with sepsis in the setting of hydroureteronephrosis of left kidney. Also with A-fib with RVR. Pt with recent gallbladder surgery. PMH includes right renal carcinoma s/p right nephrectomy, cholecystitis s/p cholecystectomy and tube placement, CVA, HTN, RA, memory loss, CKD, CAD, PAD, back surgery, and right TM amputation.    PT Comments    Patient seen for PT treatment. Pt in bed and agreeable to participate in therapy. Pt requires total A +2 for all bed mobility. Pt reports no pain at rest however once in sitting pt too painful to maintain so returned to supine and repositioned. PT will continue to follow acutely.     Follow Up Recommendations  SNF;Supervision for mobility/OOB     Equipment Recommendations  Other (comment)(defer to post acute)    Recommendations for Other Services       Precautions / Restrictions Precautions Precautions: Fall Restrictions Weight Bearing Restrictions: No    Mobility  Bed Mobility Overal bed mobility: Needs Assistance Bed Mobility: Supine to Sit;Sit to Supine     Supine to sit: Total assist;+2 for safety/equipment Sit to supine: Total assist;+2 for physical assistance   General bed mobility comments: assist for all aspects of bed mobility; pt brought into sitting EOB however became too painful to maintain and asking to return to supine; pt returned to supine and repositioned in bed with all extremities elevated and turned toward R side   Transfers                    Ambulation/Gait                 Stairs             Wheelchair Mobility    Modified Rankin (Stroke Patients Only)       Balance     Sitting balance-Leahy Scale: Zero                                       Cognition Arousal/Alertness: Awake/alert Behavior During Therapy: Flat affect Overall Cognitive Status: Impaired/Different from baseline                                 General Comments: pt agreeable to participating and reports no pain at rest; minimal verbalization rest of session      Exercises      General Comments        Pertinent Vitals/Pain Pain Assessment: Faces Faces Pain Scale: Hurts worst Pain Location: unspecified Pain Descriptors / Indicators: Grimacing;Guarding;Moaning Pain Intervention(s): Monitored during session;Limited activity within patient's tolerance;Repositioned    Home Living                      Prior Function            PT Goals (current goals can now be found in the care plan section) Progress towards PT goals: Not progressing toward goals - comment    Frequency    Min 2X/week      PT Plan Current plan remains appropriate    Co-evaluation              AM-PAC  PT "6 Clicks" Mobility   Outcome Measure  Help needed turning from your back to your side while in a flat bed without using bedrails?: Total Help needed moving from lying on your back to sitting on the side of a flat bed without using bedrails?: Total Help needed moving to and from a bed to a chair (including a wheelchair)?: Total Help needed standing up from a chair using your arms (e.g., wheelchair or bedside chair)?: Total Help needed to walk in hospital room?: Total Help needed climbing 3-5 steps with a railing? : Total 6 Click Score: 6    End of Session Equipment Utilized During Treatment: Oxygen Activity Tolerance: Patient limited by pain;Patient limited by fatigue Patient left: in bed;with call bell/phone within reach;with family/visitor present Nurse Communication: Mobility status PT Visit Diagnosis: Pain;Muscle weakness (generalized) (M62.81) Pain - part of body: (neck)     Time: [id number] PT Time Calculation (min) (ACUTE ONLY): 18  min  Charges:  $Therapeutic Activity: 8-22 mins                     Earney Navy, PTA Acute Rehabilitation Services Pager: [phone] Office: [phone]     Darliss Cheney 12/15/2019, 4:27 PM

## 2019-12-16 LAB — COMPREHENSIVE METABOLIC PANEL
ALT: 54 U/L — ABNORMAL HIGH (ref 0–44)
AST: 37 U/L (ref 15–41)
Albumin: 1.4 g/dL — ABNORMAL LOW (ref 3.5–5.0)
Alkaline Phosphatase: 98 U/L (ref 38–126)
Anion gap: 9 (ref 5–15)
BUN: 11 mg/dL (ref 8–23)
CO2: 22 mmol/L (ref 22–32)
Calcium: 8.1 mg/dL — ABNORMAL LOW (ref 8.9–10.3)
Chloride: 109 mmol/L (ref 98–111)
Creatinine, Ser: 0.72 mg/dL (ref 0.61–1.24)
GFR calc Af Amer: >60 mL/min (ref >60)
GFR calc non Af Amer: >60 mL/min (ref >60)
Glucose, Bld: 132 mg/dL — ABNORMAL HIGH (ref 70–99)
Potassium: 3 mmol/L — ABNORMAL LOW (ref 3.5–5.1)
Sodium: 140 mmol/L (ref 135–145)
Total Bilirubin: 0.3 mg/dL (ref 0.3–1.2)
Total Protein: 5.2 g/dL — ABNORMAL LOW (ref 6.5–8.1)

## 2019-12-16 LAB — GLUCOSE, CAPILLARY: Glucose-Capillary: 107 mg/dL — ABNORMAL HIGH (ref 70–99)

## 2019-12-16 LAB — CBC
HCT: 30 % — ABNORMAL LOW (ref 39.0–52.0)
Hemoglobin: 9.1 g/dL — ABNORMAL LOW (ref 13.0–17.0)
MCH: 26.1 pg (ref 26.0–34.0)
MCHC: 30.3 g/dL (ref 30.0–36.0)
MCV: 86 fL (ref 80.0–100.0)
Platelets: 385 10*3/uL (ref 150–400)
RBC: 3.49 MIL/uL — ABNORMAL LOW (ref 4.22–5.81)
RDW: 22.9 % — ABNORMAL HIGH (ref 11.5–15.5)
WBC: 15.2 10*3/uL — ABNORMAL HIGH (ref 4.0–10.5)
nRBC: 0 % (ref 0.0–0.2)

## 2019-12-16 LAB — PHOSPHORUS: Phosphorus: 1.5 mg/dL — ABNORMAL LOW (ref 2.5–4.6)

## 2019-12-16 LAB — MAGNESIUM: Magnesium: 1.4 mg/dL — ABNORMAL LOW (ref 1.7–2.4)

## 2019-12-16 MED ORDER — SODIUM PHOSPHATES 45 MMOLE/15ML IV SOLN
10.0000 mmol | Freq: Once | INTRAVENOUS | Status: AC
  Administered 2019-12-16: 10 mmol via INTRAVENOUS
  Filled 2019-12-16: qty 3.33

## 2019-12-16 MED ORDER — KETOROLAC TROMETHAMINE 15 MG/ML IJ SOLN
15.0000 mg | Freq: Three times a day (TID) | INTRAMUSCULAR | Status: DC | PRN
  Administered 2019-12-16 (×2): 15 mg via INTRAVENOUS
  Filled 2019-12-16 (×2): qty 1

## 2019-12-16 MED ORDER — CHLORHEXIDINE GLUCONATE 0.12 % MT SOLN
15.0000 mL | Freq: Two times a day (BID) | OROMUCOSAL | Status: DC
  Administered 2019-12-16 – 2019-12-19 (×6): 15 mL via OROMUCOSAL
  Filled 2019-12-16 (×4): qty 15

## 2019-12-16 MED ORDER — RESOURCE THICKENUP CLEAR PO POWD
ORAL | Status: DC | PRN
  Filled 2019-12-16: qty 125

## 2019-12-16 MED ORDER — MAGNESIUM SULFATE 2 GM/50ML IV SOLN
2.0000 g | Freq: Once | INTRAVENOUS | Status: AC
  Administered 2019-12-16: 2 g via INTRAVENOUS
  Filled 2019-12-16: qty 50

## 2019-12-16 MED ORDER — ORAL CARE MOUTH RINSE
15.0000 mL | Freq: Two times a day (BID) | OROMUCOSAL | Status: DC
  Administered 2019-12-17 – 2019-12-19 (×6): 15 mL via OROMUCOSAL

## 2019-12-16 MED ORDER — DILTIAZEM LOAD VIA INFUSION
10.0000 mg | Freq: Once | INTRAVENOUS | Status: AC
  Administered 2019-12-16: 10 mg via INTRAVENOUS
  Filled 2019-12-16: qty 10

## 2019-12-16 MED ORDER — POTASSIUM CHLORIDE 10 MEQ/100ML IV SOLN
10.0000 meq | Freq: Once | INTRAVENOUS | Status: AC
  Administered 2019-12-16: 10 meq via INTRAVENOUS
  Filled 2019-12-16: qty 100

## 2019-12-16 MED ORDER — POTASSIUM CHLORIDE 10 MEQ/100ML IV SOLN
10.0000 meq | INTRAVENOUS | Status: AC
  Administered 2019-12-16 (×5): 10 meq via INTRAVENOUS
  Filled 2019-12-16 (×5): qty 100

## 2019-12-16 MED ORDER — FENTANYL CITRATE (PF) 100 MCG/2ML IJ SOLN
12.5000 ug | Freq: Once | INTRAMUSCULAR | Status: DC
  Filled 2019-12-16: qty 2

## 2019-12-16 NOTE — Care Management Important Message (Signed)
Important Message  Patient Details  Name: Derrick [name] MRN: [id number] Date of Birth: [birth year]   Medicare Important Message Given:  Yes     Shelda Altes 12/16/2019, 10:27 AM

## 2019-12-16 NOTE — Progress Notes (Signed)
Nutrition Follow-up  DOCUMENTATION CODES:   Not applicable  INTERVENTION:   -Continue Ensure Enlive po TID, each supplement provides 350 kcal and 20 grams of protein -Continue MVI with minerals daily -Continue Magic cup TID with meals, each supplement provides 290 kcal and 9 grams of protein -Continue feeding assistance with meals -Chocolate pudding with all meal trays  NUTRITION DIAGNOSIS:   Inadequate oral intake related to altered GI function, poor appetite as evidenced by meal completion < 25%.  Ongoing  GOAL:   Patient will meet greater than or equal to 90% of their needs  Unmet  MONITOR:   PO intake, Supplement acceptance, Diet advancement, Weight trends, Labs, Skin, I & O's  REASON FOR ASSESSMENT:   Low Braden    ASSESSMENT:   86 year old man admitted from home on 3/4 with complaints of generalized weakness, lethargy, anorexia and abdominal pain for the past 2 weeks.  Per son he has had decreased urinary output but no fevers or chills.  His past medical history significant for a recent admission in December for sepsis secondary to acute cholecystitis status post percutaneous drainage by interventional radiology who has now completed antibiotic therapy, hypertension, stage III chronic kidney disease, right renal cell carcinoma status post right nephrectomy.  CT scan in the emergency department was concerning for left hydroureteronephrosis and signs of an upper UTI.  Admission was requested for further evaluation and management.  3/5- s/p BSE- advanced to dysphagia 1 diet with thin liquids 3/9- PICC placed  Reviewed I/O's: -4.3 L x 24 hours and +5.4 L since admission  UOP: 4.1 L x 24 hours  Biliary tube: 1.2 L x 24 hours  Pt sitting upright in bed, being fed by nurse tech at time of visit. Nurse tech reports pt does not like many food items, expect for milk and chocolate pudding. He consumed only one bite of vanilla magic cup. Noted meal completion 25%.    Palliative care team following for goals of care.   Medications reviewed and include IV cardizem.   Labs reviewed: K: 3.0, CBGS: 107.   NUTRITION - FOCUSED PHYSICAL EXAM:     Most Recent Value  Orbital Region  Mild depletion  Upper Arm Region  Mild depletion  Thoracic and Lumbar Region  No depletion  Buccal Region  Mild depletion  Temple Region  Mild depletion  Clavicle Bone Region  No depletion  Clavicle and Acromion Bone Region  No depletion  Scapular Bone Region  No depletion  Dorsal Hand  No depletion  Patellar Region  No depletion  Anterior Thigh Region  No depletion  Posterior Calf Region  No depletion  Edema (RD Assessment)  Moderate  Hair  Reviewed  Eyes  Reviewed  Mouth  Reviewed  Skin  Reviewed  Nails  Reviewed       Diet Order:   Diet Order            DIET - DYS 1 Room service appropriate? Yes; Fluid consistency: Thin  Diet effective now              EDUCATION NEEDS:   No education needs have been identified at this time  Skin:  Skin Assessment: Skin Integrity Issues: Skin Integrity Issues:: Other (Comment) Other: MASD to rt and lt perineum  Last BM:  12/15/19  Height:   Ht Readings from Last 1 Encounters:  12/10/19 5\' 9"  (1.753 m)    Weight:   Wt Readings from Last 1 Encounters:  12/16/19 83.7 kg  Ideal Body Weight:  72.7 kg  BMI:  Body mass index is 27.25 kg/m.  Estimated Nutritional Needs:   Kcal:  1800-2000  Protein:  85-100 grams  Fluid:  > 1.8 L    Loistine Chance, RD, LDN, [place] Registered Dietitian II Certified Diabetes Care and Education Specialist Please refer to [facility] for RD and/or RD on-call/weekend/after hours pager

## 2019-12-16 NOTE — Progress Notes (Signed)
SLP Cancellation Note  Patient Details Name: LENVILLE RINKE MRN: [id number] DOB: 09-Oct-1931   Cancelled treatment:       Reason Eval/Treat Not Completed: Patient at procedure or test/unavailable(Pt having labs drawn from RN who requested that SLP defer tx. SLP will follow up and re-attempt treatment as able.)  Iliya Spivack I. Hardin Negus, [place], [place] Office number [phone] Pager [place] 12/16/2019, 4:13 PM

## 2019-12-16 NOTE — Progress Notes (Signed)
During shift assessment, patient opened eyes with  RN interaction and answered question about pain and acknowledged RN; voice very hoarse. Patient drowsy and then returned to sleep.   Paged Choi at [phone].  Please add IV pain med; neck pain with min relief from Vlltaren gel. IV Toradol ordered.  Paged Choi at [phone].  Informed MEWS 3, tachypnea in hi 30's, lungs clear/diminised, shallow breathing, increaed O2 to 3L. Likely due to neck pain/discomfort.  Paged Choi at [phone]. Per tele at 1515; pt. had 15 beat run of VTach.on 4 of 6 K+ infusion. no distress, HR mid 90's.check Mg+ & Phos?   Mg+ and Phos drawn by RN and sent to lab at 1619

## 2019-12-16 NOTE — Progress Notes (Addendum)
Patient HR up to 140s nonsustained per telemetry.  Patient currently ranging from low 100s-120 afib. Cardizem running at max dose per order of 15/hr.  APP notified.    Orders given for cardizem 10mg  bolus.

## 2019-12-16 NOTE — Progress Notes (Signed)
PROGRESS NOTE    Derrick [name]  [id number] DOB: May 15, 1932 DOA: 12/10/2019 PCP: [name], MD     Brief Narrative:  Derrick [name] is an 86 year old man admitted from home on 3/4 with complaints of generalized weakness, lethargy, anorexia and abdominal pain for the past 2 weeks.  Per son, patient has had decreased urinary output but no fevers or chills.  His past medical history significant for a recent admission in December for sepsis secondary to acute cholecystitis status post percutaneous drainage by interventional radiology, hypertension, stage III chronic kidney disease, right renal cell carcinoma status post right nephrectomy.  CT scan in the emergency department was concerning for left hydroureteronephrosis and signs of an upper UTI.  Admission was requested for further evaluation and management.  New events last 24 hours / Subjective: Patient states that he does not have any shortness of breath.  Remains on nasal cannula O2.  Assessment & Plan:   Principal Problem:   Enterococcal bacteremia Active Problems:   HTN (hypertension)   Anemia   CKD (chronic kidney disease)   Sepsis (HCC)   RA (rheumatoid arthritis) (HCC)   Atrial fibrillation with RVR (HCC)   Hydronephrosis of left kidney   AMS (altered mental status)   Depression   Cholecystitis, S/P drain 09-2019   Goals of care, counseling/discussion   Palliative care by specialist    Sepsis secondary to Enterococcus bacteremia -Thought urinary source as urine cx also positive -Appreciate ID -Urology is recommending repeat renal US 3/7: No residual hydroureteronephrosis, clot in the bladder lumen with distended bladder despite foley catheter. He has a h/o prostate cancer.  Follow-up with Dr. Jeffie Pollock as an outpatient -TTE without vegetations. -Repeat blood culture drawn on 3/6 remain negative. If they turn positive may need to consider TEE to r/o endocarditis. -Plan to continue IV ampicillin, per ID plan to continue until  4/3 -Given advanced age and multiple medical comorbidities, appreciate palliative care following as well. Per son, full scope of treatment for now  Acute pulmonary edema -CXR 3/9 showed patchy bilateral infiltrates. Started on lasix. Diuresing well. Continue IV lasix today   Acute metabolic encephalopathy -Likely due to sepsis -CT head is negative for CVA on admission 3/4 -Repeat CT head negative 3/9  Atrial fibrillation with RVR -In the setting of sepsis -He is not on anticoagulation at home despite high CHADSVASC score, suspect on account of age, frailty and deconditioning -Remains on Cardizem drip  Recent history of cholecystitis with percutaneous drainage -Appreciate general surgery consultation.  Scheduled for outpatient cholecystectomy 4/5  Rheumatoid arthritis -At home on prednisone and Plaquenil - currently on hold during sepsis treatment   History of right renal cell carcinoma -Status post nephrectomy  Hypokalemia -Replace, trend     DVT prophylaxis: Lovenox Code Status: Full Family Communication: None at bedside Disposition Plan:  . Patient is from home prior to admission. . Currently in-hospital treatment needed due to A Fib RVR as well as pulmonary edema. . Suspect patient will discharge to possibly SNF in 2 to 3 days   Consultants:   ID  Urology  Palliative care    Antimicrobials:  Anti-infectives (From admission, onward)   Start     Dose/Rate Route Frequency Ordered Stop   12/11/19 1200  cefTRIAXone (ROCEPHIN) 1 g in sodium chloride 0.9 % 100 mL IVPB  Status:  Discontinued     1 g 200 mL/hr over 30 Minutes Intravenous  Once 12/10/19 1831 12/11/19 0143   12/11/19 0800  ampicillin (OMNIPEN)  2 g in sodium chloride 0.9 % 100 mL IVPB     2 g 300 mL/hr over 20 Minutes Intravenous Every 4 hours 12/11/19 0143 01/09/20 2359   12/11/19 0200  ampicillin (OMNIPEN) 2 g in sodium chloride 0.9 % 100 mL IVPB     2 g 300 mL/hr over 20 Minutes Intravenous  NOW 12/11/19 0143 12/11/19 0401   12/10/19 1145  cefTRIAXone (ROCEPHIN) 2 g in sodium chloride 0.9 % 100 mL IVPB     2 g 200 mL/hr over 30 Minutes Intravenous  Once 12/10/19 1138 12/10/19 1240   12/10/19 1145  metroNIDAZOLE (FLAGYL) IVPB 500 mg     500 mg 100 mL/hr over 60 Minutes Intravenous  Once 12/10/19 1139 12/10/19 1254        Objective: Vitals:   12/16/19 1000 12/16/19 1100 12/16/19 1200 12/16/19 1300  BP: 137/64 124/82 (!) 116/56   Pulse: 92 100 94   Resp: (!) 32 (!) 27 (!) 29   Temp:    (!) 97.5 F (36.4 C)  TempSrc:    Oral  SpO2: 99% 98% 97%   Weight:      Height:        Intake/Output Summary (Last 24 hours) at 12/16/2019 1303 Last data filed at 12/16/2019 1256 Gross per 24 hour  Intake 1160.26 ml  Output 3450 ml  Net -2289.74 ml   Filed Weights   12/14/19 0341 12/15/19 0451 12/16/19 0349  Weight: 82.8 kg 85.5 kg 83.7 kg    Examination:  General exam: Appears calm Respiratory system: Clear to auscultation. Respiratory effort increased, tachypneic, on nasal cannula O2 Cardiovascular system: S1 & S2 heard, irregular rhythm rate 100s, + murmur Gastrointestinal system: Abdomen is nondistended, soft and nontender. Normal bowel sounds heard.  Chole tube in place Central nervous system: Alert  Extremities: Symmetric in appearance  Skin: No rashes, lesions or ulcers on exposed skin  Psychiatry: Appears to be stable  Data Reviewed: I have personally reviewed following labs and imaging studies  CBC: Recent Labs  Lab 12/10/19 1138 12/10/19 1821 12/12/19 0514 12/13/19 0344 12/14/19 0421 12/15/19 0405 12/16/19 0352  WBC 18.6*   < > 11.5* 10.0 11.6* 15.6* 15.2*  NEUTROABS 16.1*  --   --   --   --   --   --   HGB 11.8*   < > 9.0* 9.4* 9.8* 10.0* 9.1*  HCT 37.9*   < > 29.9* 30.5* 32.1* 32.7* 30.0*  MCV 84.4   < > 86.2 85.0 85.6 85.4 86.0  PLT 379   < > 346 349 379 379 385   < > = values in this interval not displayed.   Basic Metabolic Panel: Recent Labs    Lab 12/10/19 1821 12/11/19 0330 12/12/19 0514 12/13/19 0344 12/14/19 0421 12/15/19 0405 12/16/19 0352  NA  --    < > 141 142 142 144 140  K  --    < > 3.7 3.5 3.4* 3.3* 3.0*  CL  --    < > 116* 115* 116* 117* 109  CO2  --    < > 15* 15* 17* 17* 22  GLUCOSE  --    < > 118* 119* 115* 160* 132*  BUN  --    < > 25* 18 14 15 11   CREATININE 1.24   < > 0.97 0.80 0.76 0.68 0.72  CALCIUM  --    < > 8.2* 8.1* 8.1* 8.1* 8.1*  MG 2.3  --   --   --   --   --   --  PHOS 3.7  --   --   --   --   --   --    < > = values in this interval not displayed.   GFR: Estimated Creatinine Clearance: 67.5 mL/min (by C-G formula based on SCr of 0.72 mg/dL). Liver Function Tests: Recent Labs  Lab 12/10/19 1138 12/11/19 0330 12/12/19 0514 12/14/19 0421 12/16/19 0352  AST 63* 41 32 43* 37  ALT 75* 51* 40 48* 54*  ALKPHOS 126 140* 101 105 98  BILITOT 1.1 1.0 0.6 0.7 0.3  PROT 7.0 5.7* 5.3* 5.4* 5.2*  ALBUMIN 2.6* 2.0* 1.8* 1.6* 1.4*   No results for input(s): LIPASE, AMYLASE in the last 168 hours. No results for input(s): AMMONIA in the last 168 hours. Coagulation Profile: Recent Labs  Lab 12/10/19 1138  INR 1.2   Cardiac Enzymes: No results for input(s): CKTOTAL, CKMB, CKMBINDEX, TROPONINI in the last 168 hours. BNP (last 3 results) No results for input(s): PROBNP in the last 8760 hours. HbA1C: No results for input(s): HGBA1C in the last 72 hours. CBG: Recent Labs  Lab 12/11/19 0810 12/14/19 0623 12/15/19 0449 12/16/19 0620  GLUCAP 105* 99 139* 107*   Lipid Profile: No results for input(s): CHOL, HDL, LDLCALC, TRIG, CHOLHDL, LDLDIRECT in the last 72 hours. Thyroid Function Tests: No results for input(s): TSH, T4TOTAL, FREET4, T3FREE, THYROIDAB in the last 72 hours. Anemia Panel: No results for input(s): VITAMINB12, FOLATE, FERRITIN, TIBC, IRON, RETICCTPCT in the last 72 hours. Sepsis Labs: Recent Labs  Lab 12/10/19 1138  LATICACIDVEN 1.4    Recent Results (from the past  240 hour(s))  Blood Culture (routine x 2)     Status: Abnormal   Collection Time: 12/10/19 11:30 AM   Specimen: BLOOD  Result Value Ref Range Status   Specimen Description BLOOD RIGHT ANTECUBITAL  Final   Special Requests   Final    BOTTLES DRAWN AEROBIC AND ANAEROBIC Blood Culture adequate volume   Culture  Setup Time   Final    IN BOTH AEROBIC AND ANAEROBIC BOTTLES CRITICAL RESULT CALLED TO, READ BACK BY AND VERIFIED WITH: K AMEND PHARMD 12/11/19 0125 JDW GRAM POSITIVE COCCI Performed at [facility], [place] [address] [place], [place] [address]    Culture ENTEROCOCCUS FAECALIS (A)  Final   Report Status 12/13/2019 FINAL  Final   Organism ID, Bacteria ENTEROCOCCUS FAECALIS  Final      Susceptibility   Enterococcus faecalis - MIC*    AMPICILLIN <=2 SENSITIVE Sensitive     VANCOMYCIN 1 SENSITIVE Sensitive     GENTAMICIN SYNERGY SENSITIVE Sensitive     * ENTEROCOCCUS FAECALIS  Blood Culture ID Panel (Reflexed)     Status: Abnormal   Collection Time: 12/10/19 11:30 AM  Result Value Ref Range Status   Enterococcus species DETECTED (A) NOT DETECTED Final    Comment: CRITICAL RESULT CALLED TO, READ BACK BY AND VERIFIED WITH: K AMEND PHARMD 12/11/19 0125 JDW    Vancomycin resistance NOT DETECTED NOT DETECTED Final   Listeria monocytogenes NOT DETECTED NOT DETECTED Final   Staphylococcus species NOT DETECTED NOT DETECTED Final   Staphylococcus aureus (BCID) NOT DETECTED NOT DETECTED Final   Streptococcus species NOT DETECTED NOT DETECTED Final   Streptococcus agalactiae NOT DETECTED NOT DETECTED Final   Streptococcus pneumoniae NOT DETECTED NOT DETECTED Final   Streptococcus pyogenes NOT DETECTED NOT DETECTED Final   Acinetobacter baumannii NOT DETECTED NOT DETECTED Final   Enterobacteriaceae species NOT DETECTED NOT DETECTED Final  Enterobacter cloacae complex NOT DETECTED NOT DETECTED Final   Escherichia coli NOT DETECTED NOT DETECTED Final   Klebsiella oxytoca NOT DETECTED NOT  DETECTED Final   Klebsiella pneumoniae NOT DETECTED NOT DETECTED Final   Proteus species NOT DETECTED NOT DETECTED Final   Serratia marcescens NOT DETECTED NOT DETECTED Final   Haemophilus influenzae NOT DETECTED NOT DETECTED Final   Neisseria meningitidis NOT DETECTED NOT DETECTED Final   Pseudomonas aeruginosa NOT DETECTED NOT DETECTED Final   Candida albicans NOT DETECTED NOT DETECTED Final   Candida glabrata NOT DETECTED NOT DETECTED Final   Candida krusei NOT DETECTED NOT DETECTED Final   Candida parapsilosis NOT DETECTED NOT DETECTED Final   Candida tropicalis NOT DETECTED NOT DETECTED Final    Comment: Performed at [facility], [place] [address], [place], [place] [address]  Blood Culture (routine x 2)     Status: Abnormal   Collection Time: 12/10/19 11:37 AM   Specimen: BLOOD LEFT HAND  Result Value Ref Range Status   Specimen Description BLOOD LEFT HAND  Final   Special Requests   Final    BOTTLES DRAWN AEROBIC AND ANAEROBIC Blood Culture adequate volume   Culture  Setup Time   Final    IN BOTH AEROBIC AND ANAEROBIC BOTTLES GRAM POSITIVE COCCI IN CHAINS CRITICAL VALUE NOTED.  VALUE IS CONSISTENT WITH PREVIOUSLY REPORTED AND CALLED VALUE.    Culture (A)  Final    ENTEROCOCCUS FAECALIS SUSCEPTIBILITIES PERFORMED ON PREVIOUS CULTURE WITHIN THE LAST 5 DAYS. Performed at [facility], [place] [address] [place], [place] [address]    Report Status 12/13/2019 FINAL  Final  Urine culture     Status: Abnormal   Collection Time: 12/10/19  1:50 PM   Specimen: In/Out Cath Urine  Result Value Ref Range Status   Specimen Description IN/OUT CATH URINE  Final   Special Requests   Final    NONE Performed at [facility], [place] [address], [place], [place] [address]    Culture >=100,000 COLONIES/mL ENTEROCOCCUS FAECALIS (A)  Final   Report Status 12/12/2019 FINAL  Final   Organism ID, Bacteria ENTEROCOCCUS FAECALIS (A)  Final      Susceptibility   Enterococcus faecalis -  MIC*    AMPICILLIN <=2 SENSITIVE Sensitive     NITROFURANTOIN <=16 SENSITIVE Sensitive     VANCOMYCIN 1 SENSITIVE Sensitive     * >=100,000 COLONIES/mL ENTEROCOCCUS FAECALIS  SARS CORONAVIRUS 2 (TAT 6-24 HRS) Nasopharyngeal Nasopharyngeal Swab     Status: None   Collection Time: 12/10/19  7:02 PM   Specimen: Nasopharyngeal Swab  Result Value Ref Range Status   SARS Coronavirus 2 NEGATIVE NEGATIVE Final    Comment: (NOTE) SARS-CoV-2 target nucleic acids are NOT DETECTED. The SARS-CoV-2 RNA is generally detectable in upper and lower respiratory specimens during the acute phase of infection. Negative results do not preclude SARS-CoV-2 infection, do not rule out co-infections with other pathogens, and should not be used as the sole basis for treatment or other patient management decisions. Negative results must be combined with clinical observations, patient history, and epidemiological information. The expected result is Negative. Fact Sheet for Patients: SugarRoll.be Fact Sheet for Healthcare Providers: https://www.woods-mathews.com/ This test is not yet approved or cleared by the [place] FDA and  has been authorized for detection and/or diagnosis of SARS-CoV-2 by FDA under an Emergency Use Authorization (EUA). This EUA will remain  in effect (meaning this test can be used) for the duration of  the COVID-19 declaration under Section 56 4(b)(1) of the Act, 21 U.S.C. section 360bbb-3(b)(1), unless the authorization is terminated or revoked sooner. Performed at [facility], [place] [address] [place], [place] [address]   Culture, blood (routine x 2)     Status: None (Preliminary result)   Collection Time: 12/12/19  5:16 AM   Specimen: BLOOD RIGHT HAND  Result Value Ref Range Status   Specimen Description BLOOD RIGHT HAND  Final   Special Requests   Final    BOTTLES DRAWN AEROBIC AND ANAEROBIC Blood Culture adequate volume   Culture    Final    NO GROWTH 4 DAYS Performed at [facility], [place] [address], [place], [place] [address]    Report Status PENDING  Incomplete  Culture, blood (routine x 2)     Status: None (Preliminary result)   Collection Time: 12/12/19  5:20 AM   Specimen: BLOOD LEFT HAND  Result Value Ref Range Status   Specimen Description BLOOD LEFT HAND  Final   Special Requests   Final    BOTTLES DRAWN AEROBIC AND ANAEROBIC Blood Culture adequate volume   Culture   Final    NO GROWTH 4 DAYS Performed at [facility], [place] [address] [place], [place] [address]    Report Status PENDING  Incomplete      Radiology Studies: CT HEAD WO CONTRAST  Result Date: 12/15/2019 CLINICAL DATA:  Focal neuro deficit.  Sepsis. EXAM: CT HEAD WITHOUT CONTRAST TECHNIQUE: Contiguous axial images were obtained from the base of the skull through the vertex without intravenous contrast. COMPARISON:  CT head 12/10/2019 FINDINGS: Brain: Advanced atrophy with ventricular enlargement unchanged. Extensive chronic microvascular ischemic change throughout the white matter unchanged. 2.5 x 3.5 cm arachnoid cyst left parietal convexity unchanged. Negative for acute infarct, hemorrhage, mass. Vascular: Negative for hyperdense vessel. Atherosclerotic calcification in the carotid and vertebral arteries. Skull: Negative Sinuses/Orbits: Paranasal sinuses clear.  No orbital lesion. Other: None IMPRESSION: No acute abnormality and no change from the recent CT Extensive atrophy and severe chronic white matter ischemia. Electronically Signed   By: Franchot Gallo M.D.   On: 12/15/2019 13:53   DG CHEST PORT 1 VIEW  Result Date: 12/15/2019 CLINICAL DATA:  Respiratory distress. Sepsis. EXAM: PORTABLE CHEST 1 VIEW COMPARISON:  12/10/2019 FINDINGS: The cardiac silhouette, mediastinal and hilar contours are within normal limits given the AP projection and portable technique. There is stable tortuosity and calcification of the thoracic aorta.  Patchy ill-defined infiltrates. No definite pleural effusions. The bony thorax is intact IMPRESSION: Patchy bilateral infiltrates. Electronically Signed   By: Marijo Sanes M.D.   On: 12/15/2019 10:08   [place] EKG SITE RITE  Result Date: 12/15/2019 If Site Rite image not attached, placement could not be confirmed due to current cardiac rhythm.     Scheduled Meds: . chlorhexidine  15 mL Mouth Rinse BID  . Chlorhexidine Gluconate Cloth  6 each Topical Daily  . enoxaparin (LOVENOX) injection  40 mg Subcutaneous Q24H  . feeding supplement (ENSURE ENLIVE)  237 mL Oral TID BM  . furosemide  40 mg Intravenous Q12H  . mouth rinse  15 mL Mouth Rinse q12n4p  . multivitamin with minerals  1 tablet Oral Daily  . sodium chloride flush  10-40 mL Intracatheter Q12H  . sodium chloride flush  10-40 mL Intracatheter Q12H   Continuous Infusions: . sodium chloride 250 mL (12/15/19 0028)  . ampicillin (OMNIPEN) IV 2 g (12/16/19 1247)  . diltiazem (  CARDIZEM) infusion 15 mg/hr (12/16/19 1132)  . potassium chloride 10 mEq (12/16/19 1254)     LOS: 6 days      Time spent: 35 minutes   Dessa Phi, DO [facility] 12/16/2019, 1:03 PM   Available via Epic secure chat 7am-7pm After these hours, please refer to coverage provider listed on [email or web address]

## 2019-12-16 NOTE — Progress Notes (Signed)
1009 called Rapid RN to assess patient, informed of clear lungs, but tachypnea mid to hi 30's, HR in 110's and max titration on Cardizem gtt. Informed no distress and tachypnea may be due to discomfort, however, Rapid RN assess given MEWS of 3 (yellow).

## 2019-12-16 NOTE — Progress Notes (Signed)
  Speech Language Pathology Treatment: Dysphagia  Patient Details Name: Carlisle NELLI MRN: [id number] DOB: September 10, 1932 Today's Date: 12/16/2019 Time: [id number] SLP Time Calculation (min) (ACUTE ONLY): 16 min  Assessment / Plan / Recommendation Clinical Impression  Pt was seen for dysphagia treatment to assess continued tolerance of the current diet. He was adequately alert for the session but he exhibited difficulty maintaining alertness for extended periods. Nursing reported change in respiration and signs of aspiration with thin liquids today. He inconsistently exhibited weak coughing with thin liquids via straw, suggesting possible aspiration. It is also noteworthy that he reported dyspnea and respiratory rate increased from high 20s to high 30s and low 40s following intake of thin liquids with ultimate return to baseline. He tolerated puree solids and nectar thick liquids via straw without overt s/sx of aspiration. An oral and/or pharyngeal delay is suspected considering time of presentation compared to that of hyolaryngeal elevation. It is recommended that pt's diet be downgraded to puree solids with nectar thick liquids at this time. A modified barium swallow is also clinically indicated to further assess swallow function.   HPI HPI: Pt is an 86 y.o. male with medical history significant of rheumatoid arthritis on prednisone, Plaquenil, coronary artery disease, PAD, depression, hypertension, CKD stage III, right renal carcinoma status post right nephrectomy, cholecystitis status post cholecystectomy and tube placement by IR who presented to the ED with generalized weakness, lethargic, loss of appetite and abdominal pain since 2 weeks. CT of the head negative for acute changes. Extensive bilateral corona radiata white matter disease. Chest x-ray 3/4: no acute disease.      SLP Plan  New goals to be determined pending instrumental study       Recommendations  Diet recommendations: Dysphagia 1  (puree);Thin liquid Liquids provided via: Straw;Cup Medication Administration: Crushed with puree Supervision: Full supervision/cueing for compensatory strategies Compensations: Minimize environmental distractions;Small sips/bites Postural Changes and/or Swallow Maneuvers: Seated upright 90 degrees;Upright 30-60 min after meal                Oral Care Recommendations: Oral care BID Follow up Recommendations: Other (comment)(tbd) SLP Visit Diagnosis: Dysphagia, oropharyngeal phase (R13.12) Plan: New goals to be determined pending instrumental study       Nyimah Shadduck I. Hardin Negus, [place], [place] Office number [phone] Pager [place] 12/16/2019, 5:40 PM

## 2019-12-16 NOTE — Progress Notes (Signed)
   Vital Signs MEWS/VS Documentation      12/15/2019 2200 12/15/2019 2300 12/16/2019 0000 12/16/2019 0030   MEWS Score:  3  2  3  2    MEWS Score Color:  Yellow  Yellow  Yellow  Yellow   Resp:  --  --  (!) 24  (!) 22   Pulse:  81  --  (!) 106  --   BP:  (!) 149/59  --  133/67  (!) 140/50   Temp:  --  --  --  98.4 F (36.9 C)   Level of Consciousness:  --  Alert  --  --     Yellow mews score is not an acute change for patient.  Patient has had elevated RR and HR since admission.  Currently in atrial fibrillation.  Cardizem drip at 15mg /hr.         Kirk Lions 12/16/2019,1:02 AM

## 2019-12-16 NOTE — Significant Event (Addendum)
Rapid Response Event Note  Overview: Time Called: 1010 Arrival Time: 1025 Event Type: Respiratory  Called for pt having tachypnea. Pt respiratory rate up to 40, non-sustained. Maintaining 25-35 breaths/min.  99% on 3LNC, BP 137/64, HR 93  Initial Focused Assessment: Pt lying in bed, alert. Lung sounds are clear throughout. Pt nods appropriately. Pt denies pain, endorses anxiety. Pt is disoriented to year, place, and situation. Pt whispers. Cough is weak, congested. Gag reflex +. Pt states he is unable to take a deep breath. Breathing is shallow. Accessory muscle use noted in his chest and upper abdomen. Pt does not appear distressed.   Review of vital signs reveals tachypnea has been intermittent throughout this admission. CXR completed yesterday.   Interventions: Oral care, repositioning  Plan of Care (if not transferred): -Pulmonary hygiene: oral care, turning and repositioning, incentive spirometer. -Treat anxiety and pain, per pt. request. -Update attending MD  Rounded back on pt at 1500. RN informs me that pt had a run of vtach. Recommended checking a BMP, Magnesium and Phosphorus level. Pt is actively receiving K+ runs, 4/6 complete. RN informs me of concern regarding pt aspirating with thin liquids. Considered CXR. Pt remains unchanged from earlier assessment. RR still 25-35.   Event Summary: Outcome: Stayed in room and stabalized Event End Time: Ken C[place]

## 2019-12-16 NOTE — Progress Notes (Signed)
Paged Choi at [phone].Pt. congested cough & RR increase w/ sips of H2O (pt. requested H2O & awake/alert),may be aspirating.   Paged Choi at [employer]. Add thicken up for liquids? Upon callback 1345 informed to notify SLP of need for re-eval.  1440 assisted with eating; patient ate 4 small spoonfuls of magic cup dessert and 1/2 Ensure before declined further food/drink. Consumed Ensure and dessert with no coughing. Notfied SLP at 1450; per SLP Hardin Negus will reasess patient.   Per SLP; patient report SOB and will order swall eval; nectar thick liquids. RN to room to assess. Patient reported SOB, upon inquiry SOB began with neck pain and eating. RN attempted to readjust (painful), neck support given. Per SLP will order swallow study for 3/11. RR decrease with repositioning  Paged Choi at [phone]. Mg+ and Phos resulted: Mg+ 1.4, Phos 1.5.complain SOB, some relief w/ repositioning. any other pain med for neck?

## 2019-12-16 NOTE — Progress Notes (Addendum)
   12/16/19 1000  Vitals  BP 137/64  MAP (mmHg) 82  Pulse Rate 92  ECG Heart Rate (!) 103  Resp (!) 32  Oxygen Therapy  SpO2 99 %  O2 Device Nasal Cannula  O2 Flow Rate (L/min) 2 L/min  MEWS Score  MEWS Temp 0  MEWS Systolic 0  MEWS Pulse 1  MEWS RR 2  MEWS LOC 0  MEWS Score 3  MEWS Score Color Yellow  MEWS Assessment  Is this an acute change? No  Provider Notification  Provider Name/Title Dessa Phi DO  Date Provider Notified 12/16/19  Time Provider Notified 1100  Notification Type Page  Notification Reason Other (Comment) (update)  Response No new orders  Date of Provider Response 12/16/19  Rapid Response Notification  Name of Rapid Response RN Notified Kennis Carina RN  Date Rapid Response Notified 12/16/19  Time Rapid Response Notified [id number]   Informed Rapid RN and attending DO of tachypnea; likely associated with neck discomfort pain.

## 2019-12-16 NOTE — Progress Notes (Signed)
Pt repositioned in bed and increased O2 3LNC . Pt states he cant get a deep breath. RRT Called by primary RN

## 2019-12-17 ENCOUNTER — Inpatient Hospital Stay (HOSPITAL_COMMUNITY): Payer: Medicare Other

## 2019-12-17 ENCOUNTER — Inpatient Hospital Stay (HOSPITAL_COMMUNITY): Payer: Medicare Other | Admitting: Anesthesiology

## 2019-12-17 LAB — BASIC METABOLIC PANEL
Anion gap: 7 (ref 5–15)
BUN: 14 mg/dL (ref 8–23)
CO2: 24 mmol/L (ref 22–32)
Calcium: 8 mg/dL — ABNORMAL LOW (ref 8.9–10.3)
Chloride: 110 mmol/L (ref 98–111)
Creatinine, Ser: 0.79 mg/dL (ref 0.61–1.24)
GFR calc Af Amer: >60 mL/min (ref >60)
GFR calc non Af Amer: >60 mL/min (ref >60)
Glucose, Bld: 122 mg/dL — ABNORMAL HIGH (ref 70–99)
Potassium: 3.2 mmol/L — ABNORMAL LOW (ref 3.5–5.1)
Sodium: 141 mmol/L (ref 135–145)

## 2019-12-17 LAB — COMPREHENSIVE METABOLIC PANEL
ALT: 79 U/L — ABNORMAL HIGH (ref 0–44)
AST: 84 U/L — ABNORMAL HIGH (ref 15–41)
Albumin: 1.4 g/dL — ABNORMAL LOW (ref 3.5–5.0)
Alkaline Phosphatase: 117 U/L (ref 38–126)
Anion gap: 9 (ref 5–15)
BUN: 14 mg/dL (ref 8–23)
CO2: 22 mmol/L (ref 22–32)
Calcium: 7.7 mg/dL — ABNORMAL LOW (ref 8.9–10.3)
Chloride: 109 mmol/L (ref 98–111)
Creatinine, Ser: 0.77 mg/dL (ref 0.61–1.24)
GFR calc Af Amer: >60 mL/min (ref >60)
GFR calc non Af Amer: >60 mL/min (ref >60)
Glucose, Bld: 108 mg/dL — ABNORMAL HIGH (ref 70–99)
Potassium: 3.7 mmol/L (ref 3.5–5.1)
Sodium: 140 mmol/L (ref 135–145)
Total Bilirubin: 0.5 mg/dL (ref 0.3–1.2)
Total Protein: 5 g/dL — ABNORMAL LOW (ref 6.5–8.1)

## 2019-12-17 LAB — CBC WITH DIFFERENTIAL/PLATELET
Abs Immature Granulocytes: 0.07 10*3/uL (ref 0.00–0.07)
Basophils Absolute: 0 10*3/uL (ref 0.0–0.1)
Basophils Relative: 0 %
Eosinophils Absolute: 0.3 10*3/uL (ref 0.0–0.5)
Eosinophils Relative: 2 %
HCT: 29.4 % — ABNORMAL LOW (ref 39.0–52.0)
Hemoglobin: 9 g/dL — ABNORMAL LOW (ref 13.0–17.0)
Immature Granulocytes: 1 %
Lymphocytes Relative: 8 %
Lymphs Abs: 1 10*3/uL (ref 0.7–4.0)
MCH: 26.5 pg (ref 26.0–34.0)
MCHC: 30.6 g/dL (ref 30.0–36.0)
MCV: 86.5 fL (ref 80.0–100.0)
Monocytes Absolute: 0.8 10*3/uL (ref 0.1–1.0)
Monocytes Relative: 6 %
Neutro Abs: 11.1 10*3/uL — ABNORMAL HIGH (ref 1.7–7.7)
Neutrophils Relative %: 83 %
Platelets: 400 10*3/uL (ref 150–400)
RBC: 3.4 MIL/uL — ABNORMAL LOW (ref 4.22–5.81)
RDW: 22.9 % — ABNORMAL HIGH (ref 11.5–15.5)
WBC: 13.3 10*3/uL — ABNORMAL HIGH (ref 4.0–10.5)
nRBC: 0 % (ref 0.0–0.2)

## 2019-12-17 LAB — CULTURE, BLOOD (ROUTINE X 2)
Culture: NO GROWTH
Culture: NO GROWTH
Special Requests: ADEQUATE
Special Requests: ADEQUATE

## 2019-12-17 LAB — GLUCOSE, CAPILLARY: Glucose-Capillary: 117 mg/dL — ABNORMAL HIGH (ref 70–99)

## 2019-12-17 LAB — CBC
HCT: 28.8 % — ABNORMAL LOW (ref 39.0–52.0)
Hemoglobin: 8.7 g/dL — ABNORMAL LOW (ref 13.0–17.0)
MCH: 26.2 pg (ref 26.0–34.0)
MCHC: 30.2 g/dL (ref 30.0–36.0)
MCV: 86.7 fL (ref 80.0–100.0)
Platelets: 342 10*3/uL (ref 150–400)
RBC: 3.32 MIL/uL — ABNORMAL LOW (ref 4.22–5.81)
RDW: 23 % — ABNORMAL HIGH (ref 11.5–15.5)
WBC: 12.4 10*3/uL — ABNORMAL HIGH (ref 4.0–10.5)
nRBC: 0 % (ref 0.0–0.2)

## 2019-12-17 LAB — MAGNESIUM: Magnesium: 1.9 mg/dL (ref 1.7–2.4)

## 2019-12-17 MED ORDER — GABAPENTIN 300 MG PO CAPS: 300.0000 mg | ORAL_CAPSULE | ORAL | Status: DC

## 2019-12-17 MED ORDER — DILTIAZEM HCL 60 MG PO TABS
90.0000 mg | ORAL_TABLET | Freq: Four times a day (QID) | ORAL | Status: DC
  Administered 2019-12-17 – 2019-12-18 (×5): 90 mg via ORAL
  Filled 2019-12-17 (×5): qty 1

## 2019-12-17 MED ORDER — ACETAMINOPHEN 500 MG PO TABS: 1000.0000 mg | ORAL_TABLET | ORAL | Status: DC

## 2019-12-17 MED ORDER — CHLORHEXIDINE GLUCONATE CLOTH 2 % EX PADS
6.0000 | MEDICATED_PAD | Freq: Once | CUTANEOUS | Status: AC
  Administered 2019-12-17: 6 via TOPICAL

## 2019-12-17 MED ORDER — CELECOXIB 200 MG PO CAPS: 200.0000 mg | ORAL_CAPSULE | ORAL | Status: DC

## 2019-12-17 MED ORDER — POTASSIUM CHLORIDE 10 MEQ/100ML IV SOLN
10.0000 meq | INTRAVENOUS | Status: AC
  Administered 2019-12-17 (×6): 10 meq via INTRAVENOUS
  Filled 2019-12-17 (×6): qty 100

## 2019-12-17 MED ORDER — POTASSIUM CHLORIDE 10 MEQ/100ML IV SOLN: 10.0000 meq | Freq: Once | INTRAVENOUS | Status: DC

## 2019-12-17 MED ORDER — CEFAZOLIN SODIUM-DEXTROSE 2-4 GM/100ML-% IV SOLN: 2.0000 g | INTRAVENOUS | Status: DC

## 2019-12-17 MED ORDER — CHLORHEXIDINE GLUCONATE CLOTH 2 % EX PADS
6.0000 | MEDICATED_PAD | Freq: Once | CUTANEOUS | Status: AC
  Administered 2019-12-18: 6 via TOPICAL

## 2019-12-17 NOTE — Anesthesia Preprocedure Evaluation (Signed)
Anesthesia Evaluation    Airway        Dental   Pulmonary former smoker,           Cardiovascular hypertension, + angina + CAD and + Peripheral Vascular Disease       Neuro/Psych    GI/Hepatic GERD  ,  Endo/Other    Renal/GU Renal disease     Musculoskeletal   Abdominal   Peds  Hematology  (+) anemia ,   Anesthesia Other Findings   Reproductive/Obstetrics                             Anesthesia Physical Anesthesia Plan  ASA: III  Anesthesia Plan: General   Post-op Pain Management:    Induction: Intravenous  PONV Risk Score and Plan: 2 and Ondansetron and Midazolam  Airway Management Planned: Oral ETT  Additional Equipment:   Intra-op Plan:   Post-operative Plan:   Informed Consent:   Plan Discussed with: Anesthesiologist  Anesthesia Plan Comments:         Anesthesia Quick Evaluation

## 2019-12-17 NOTE — TOC Progression Note (Signed)
Transition of Care [facility]) - Progression Note    Patient Details  Name: Derrick [name] MRN: [id number] Date of Birth: [birth year]  Transition of Care [facility]) CM/SW [place], [place] Phone Number: 12/17/2019, 2:16 PM  Clinical Narrative:     CSW spoke with patient's son Derrick [name] regarding discharge planning. CSW informed Derrick [name] that CSW has met at bedside with patient's wife 3 days ago and she was very hesitant in agreeing to SNF and did not want to start the process at that time. Derrick [name] reports he is in agreement with SNF and gives permission to fax out referrals in [place] area, and he will also talk to his mom Derrick [name] as well. Derrick [name] expressed understanding that patient would not be able to safely discharge home with wife and that he would also explain this to patient's wife Derrick [name].   CSW has faxed out referrals for SNF , pending bed offers at this time.        Expected Discharge Plan and Services                                                 Social Determinants of Health (SDOH) Interventions    Readmission Risk Interventions Readmission Risk Prevention Plan 09/22/2019  Transportation Screening Complete  Medication Review ([place]) Complete  PCP or Specialist appointment within 3-5 days of discharge Complete  HRI or Home Care Consult Complete  SW Recovery Care/Counseling Consult Complete  Palliative Care Screening Not [place] Complete  Some recent data might be hidden

## 2019-12-17 NOTE — Consult Note (Addendum)
Chief Complaint   Chief Complaint  Patient presents with  . Code Sepsis    HPI   Consult requested by: Dr Dessa Phi, [facility] Reason for consult: Cervical stenosis  HPI: Derrick [name] is a 86 y.o. male with multiple comorbidities, admitted 12/10/2018 for sepsis secondary to enterococcus bacteremia, encephalopathy who was noted to not be moving his BUE today. An MRI of his cervical spine was ordered revealing severe cervical stenosis at C3-4. A NSY consultation was requested. I immediately came by to evaluate the patient.  Patient still appears confused. He answers simple questions with yes and no, but unable to give many details. I did call and speak with patient's son, Derrick [name]. He reports 3-4 weeks ago, patient Derrick [name] has been having difficulties walking due to BLE, L>R, weakness. This has progressed to involve BUE over the last week or so which also prompted to ED visit. Son says that patient is not at baseline mentally. Son has talked to his mom (patient's wife) and they want all aggressive measures, including surgery.  Patient Active Problem List   Diagnosis Date Noted  . Enterococcal bacteremia 12/11/2019  . Cholecystitis, S/P drain 09-2019 12/11/2019  . Goals of care, counseling/discussion   . Palliative care by specialist   . Hydronephrosis of left kidney 12/10/2019  . AMS (altered mental status) 12/10/2019  . Depression 12/10/2019  . Atrial fibrillation with RVR ([place]) 09/19/2019  . Acute cholecystitis 09/18/2019  . E coli bacteremia 09/17/2019  . Sepsis ([place]) 09/16/2019  . Transaminitis 09/16/2019  . Nausea and vomiting 09/16/2019  . RA (rheumatoid arthritis) ([place]) 09/16/2019  . Arm pain 07/07/2019  . A-fib ([place]) 12/12/2018  . Acute blood loss anemia 11/28/2018  . Tachycardia 11/27/2018  . Gross hematuria 11/27/2018  . Acute metabolic encephalopathy [id number]  . Spinal stenosis, lumbar region with neurogenic claudication 11/26/2018  . Ulcer of right foot limited to  breakdown of skin ([place])   . PAD (peripheral artery disease) ([place]) 09/23/2017  . Syncope and collapse 09/19/2017  . Osteomyelitis ([place])   . Malaise 09/09/2017  . Anorexia 09/09/2017  . Hypotension 09/09/2017  . Volume depletion 09/09/2017  . AKI (acute kidney injury) ([place]) 09/09/2017  . Hyponatremia 06/12/2017  . COPD (chronic obstructive pulmonary disease) ([place])   . Lacunar infarction ([place])   . Gangrene of toe of right foot ([place])   . Pre-syncope 11/21/2016  . Dyspnea on exertion 03/03/2016  . H/O unilateral Rt nephrectomy 03/03/2016  . Asymmetrical right sensorineural hearing loss 02/24/2016  . Bilateral hearing loss 02/24/2016  . Bilateral impacted cerumen 02/24/2016  . Arrhythmia 11/09/2015  . CAD (coronary artery disease) 11/09/2015  . Abnormal ECG 10/22/2013  . Dyspnea 10/22/2013  . Chest pain 11/18/2011  . Unstable angina ([place]) 11/18/2011  . Thrombocytopenia ([place]) 11/18/2011  . Anemia 11/18/2011  . CKD (chronic kidney disease) 11/18/2011  . HTN (hypertension)   . High cholesterol   . GERD (gastroesophageal reflux disease)   . BPH (benign prostatic hyperplasia)     PMH: Past Medical History:  Diagnosis Date  . Anemia   . Anxiety   . Arthritis, rheumatoid ([place])   . BPH (benign prostatic hyperplasia)   . CAD (coronary artery disease)   . CKD (chronic kidney disease)   . Depression   . Diverticulosis   . Elevated PSA   . Gangrene ([place])    of toe right foot  . GERD (gastroesophageal reflux disease)   . Herpes simplex labialis   . High cholesterol   .  History of colonic polyps   . Hypertension   . Insomnia   . Lacunar infarction ([place])   . Memory loss   . Obesity   . Panic attacks   . Prostate cancer ([place]) YRS AGO  . Renal cell cancer ([place])    s/p nephrectomy in 2000  . Seropositive rheumatoid arthritis ([place])   . Spermatocele   . Tinnitus   . Tremor of both hands     PSH: Past Surgical History:  Procedure Laterality Date  . AMPUTATION TOE Right  06/13/2017   Procedure: AMPUTATION TOE 1st and 2nd;  Surgeon: [name], DPM;  Location: [facility];  Service: Podiatry;  Laterality: Right;  . AMPUTATION TOE     RIGHT FOOT ALL TOES GONE  . BACK SURGERY  YRS AGO   LOWER  . COLONOSCOPY    . GRAFT APPLICATION Right 0000000   Procedure: EPIFIX GRAFT APPLICATION OF RIGHT FOOT;  Surgeon: [name], DPM;  Location: [facility] [facility];  Service: Podiatry;  Laterality: Right;  . I & D EXTREMITY Right 09/13/2017   Procedure: IRRIGATION AND DEBRIDEMENT AND BONE BIOPSY;  Surgeon: [name], DPM;  Location: [facility];  Service: Podiatry;  Laterality: Right;  . INCISION AND DRAINAGE OF WOUND Right 12/18/2017   Procedure: IRRIGATION AND DEBRIDEMENT WOUND ON RIGHT FOOT;  Surgeon: [name], DPM;  Location: [facility];  Service: Podiatry;  Laterality: Right;  . IR ANGIOGRAM EXTREMITY RIGHT  05/30/2017  . IR ANGIOGRAM EXTREMITY RIGHT  08/08/2017  . IR ANGIOGRAM SELECTIVE EACH ADDITIONAL VESSEL  08/08/2017  . IR ANGIOGRAM SELECTIVE EACH ADDITIONAL VESSEL  08/08/2017  . IR CATHETER TUBE CHANGE  09/26/2019  . IR CATHETER TUBE CHANGE  11/23/2019  . IR PERC CHOLECYSTOSTOMY  09/18/2019  . IR RADIOLOGIST EVAL & MGMT  05/16/2017  . IR TIB-PERO ART PTA MOD SED  05/30/2017  . IR TIB-PERO ART PTA MOD SED  08/08/2017  . IR TIB-PERO ART UNI PTA EA ADD VESSEL MOD SED  05/30/2017  . IR TIB-PERO ART UNI PTA EA ADD VESSEL MOD SED  08/08/2017  . IR US GUIDE VASC ACCESS RIGHT  05/30/2017  . IR US GUIDE VASC ACCESS RIGHT  08/08/2017  . LEFT HEART CATHETERIZATION WITH CORONARY ANGIOGRAM N/A 11/19/2011   Procedure: LEFT HEART CATHETERIZATION WITH CORONARY ANGIOGRAM;  Surgeon: [name], MD;  Location: [facility] CATH LAB;  Service: Cardiovascular;  Laterality: N/A;  . LUMBAR LAMINECTOMY/DECOMPRESSION MICRODISCECTOMY N/A 11/26/2018   Procedure: Lumbar central decompression L2-3, L3-4;  Surgeon: [name], MD;  Location: [facility];  Service: Orthopedics;  Laterality: N/A;  127min  .  NEPHRECTOMY  10/1998  . TRANSMETATARSAL AMPUTATION Right 08/14/2017   Procedure: TRANSMETATARSAL AMPUTATION;  Surgeon: [name], DPM;  Location: [facility];  Service: Podiatry;  Laterality: Right;    Medications Prior to Admission  Medication Sig Dispense Refill Last Dose  . acetaminophen (TYLENOL) 325 MG tablet Take 2 tablets (650 mg total) by mouth every 6 (six) hours as needed for mild pain (or Fever >/= 101).   12/09/2019 at Unknown time  . aspirin 81 MG chewable tablet Chew 81 mg by mouth daily.   12/09/2019 at Unknown time  . atorvastatin (LIPITOR) 40 MG tablet Take 40 mg by mouth daily.   12/09/2019 at Unknown time  . clopidogrel (PLAVIX) 75 MG tablet Take 75 mg by mouth daily.   12/09/2019 at Unknown time  . diltiazem (CARDIZEM) 30 MG tablet Take 30 mg by mouth 2 (two)  times daily.   12/09/2019 at Unknown time  . diphenhydrAMINE HCl (ZZZQUIL) 50 MG/30ML LIQD Take 30 mLs by mouth at bedtime as needed (sleep).   12/09/2019 at Unknown time  . docusate sodium (COLACE) 250 MG capsule Take 250 mg by mouth daily.   12/09/2019 at Unknown time  . feeding supplement, ENSURE ENLIVE, (ENSURE ENLIVE) LIQD Take 237 mLs by mouth 2 (two) times daily between meals. 237 mL 12 12/09/2019 at Unknown time  . finasteride (PROSCAR) 5 MG tablet Take 5 mg by mouth every evening.    12/09/2019 at Unknown time  . hydroxychloroquine (PLAQUENIL) 200 MG tablet Take 200 mg by mouth daily.   12/09/2019 at Unknown time  . Multiple Vitamin (MULTIVITAMIN WITH MINERALS) TABS tablet Take 1 tablet by mouth every evening.    12/09/2019 at Unknown time  . nitroGLYCERIN (NITROSTAT) 0.3 MG SL tablet Place 0.3 mg under the tongue every 5 (five) minutes as needed for chest pain.   unk  . Omega-3 Fatty Acids (FISH OIL) 1000 MG CAPS Take 1,000 mg by mouth daily.   12/09/2019 at Unknown time  . ondansetron (ZOFRAN) 4 MG tablet Take 1 tablet (4 mg total) by mouth every 6 (six) hours as needed for nausea. 20 tablet 0 unk  . pantoprazole (PROTONIX) 40 MG  tablet Take 40 mg by mouth 2 (two) times daily.   12/09/2019 at Unknown time  . polyethylene glycol (MIRALAX / GLYCOLAX) 17 g packet Take 17 g by mouth daily as needed for mild constipation.   unk  . predniSONE (DELTASONE) 5 MG tablet Take 5 mg by mouth daily with breakfast.   12/09/2019 at Unknown time  . sertraline (ZOLOFT) 25 MG tablet Take 25 mg by mouth daily.   12/09/2019 at Unknown time  . Amino Acids-Protein Hydrolys (FEEDING SUPPLEMENT, PRO-STAT SUGAR FREE 64,) LIQD Take 30 mLs by mouth 2 (two) times daily. (Patient not taking: Reported on 12/10/2019) 887 mL 0 Not Taking at Unknown time    SH: Social History   Tobacco Use  . Smoking status: Former Smoker    Packs/day: 1.00    Years: 29.00    Pack years: 29.00    Types: Cigarettes    Quit date: 10/08/1978    Years since quitting: 41.2  . Smokeless tobacco: Never Used  . Tobacco comment: tried smokeless tobacco once but made him sick  Substance Use Topics  . Alcohol use: No  . Drug use: No    MEDS: Prior to Admission medications   Medication Sig Start Date End Date Taking? Authorizing Provider  acetaminophen (TYLENOL) 325 MG tablet Take 2 tablets (650 mg total) by mouth every 6 (six) hours as needed for mild pain (or Fever >/= 101). 09/30/19  Yes Guilford Shi, MD  aspirin 81 MG chewable tablet Chew 81 mg by mouth daily.   Yes [provider]  atorvastatin (LIPITOR) 40 MG tablet Take 40 mg by mouth daily. 10/20/19  Yes [provider]  clopidogrel (PLAVIX) 75 MG tablet Take 75 mg by mouth daily.   Yes [provider]  diltiazem (CARDIZEM) 30 MG tablet Take 30 mg by mouth 2 (two) times daily. 10/28/19  Yes [provider]  diphenhydrAMINE HCl (ZZZQUIL) 50 MG/30ML LIQD Take 30 mLs by mouth at bedtime as needed (sleep).   Yes [provider]  docusate sodium (COLACE) 250 MG capsule Take 250 mg by mouth daily.   Yes [provider]  feeding supplement, ENSURE ENLIVE, (ENSURE ENLIVE)  LIQD Take  237 mLs by mouth 2 (two) times daily between meals. 09/30/19  Yes Guilford Shi, MD  finasteride (PROSCAR) 5 MG tablet Take 5 mg by mouth every evening.    Yes [provider]  hydroxychloroquine (PLAQUENIL) 200 MG tablet Take 200 mg by mouth daily.   Yes [provider]  Multiple Vitamin (MULTIVITAMIN WITH MINERALS) TABS tablet Take 1 tablet by mouth every evening.    Yes [provider]  nitroGLYCERIN (NITROSTAT) 0.3 MG SL tablet Place 0.3 mg under the tongue every 5 (five) minutes as needed for chest pain.   Yes [provider]  Omega-3 Fatty Acids (FISH OIL) 1000 MG CAPS Take 1,000 mg by mouth daily.   Yes [provider]  ondansetron (ZOFRAN) 4 MG tablet Take 1 tablet (4 mg total) by mouth every 6 (six) hours as needed for nausea. 09/30/19  Yes Guilford Shi, MD  pantoprazole (PROTONIX) 40 MG tablet Take 40 mg by mouth 2 (two) times daily. 08/03/19  Yes [provider]  polyethylene glycol (MIRALAX / GLYCOLAX) 17 g packet Take 17 g by mouth daily as needed for mild constipation.   Yes [provider]  predniSONE (DELTASONE) 5 MG tablet Take 5 mg by mouth daily with breakfast.   Yes [provider]  sertraline (ZOLOFT) 25 MG tablet Take 25 mg by mouth daily.   Yes [provider]  Amino Acids-Protein Hydrolys (FEEDING SUPPLEMENT, PRO-STAT SUGAR FREE 64,) LIQD Take 30 mLs by mouth 2 (two) times daily. Patient not taking: Reported on 12/10/2019 09/30/19   Guilford Shi, MD    ALLERGY: Allergies  Allergen Reactions  . Effexor [Venlafaxine] Other (See Comments)    Sexual side affects  . Lisinopril Cough  . Paxil [Paroxetine] Other (See Comments)    Sexual side affects  . Viagra [Sildenafil] Other (See Comments)    Chest discomfort  . Wellbutrin [Bupropion] Itching    Social History   Tobacco Use  . Smoking status: Former Smoker    Packs/day: 1.00    Years: 29.00    Pack years: 29.00      Types: Cigarettes    Quit date: 10/08/1978    Years since quitting: 41.2  . Smokeless tobacco: Never Used  . Tobacco comment: tried smokeless tobacco once but made him sick  Substance Use Topics  . Alcohol use: No     Family History  Problem Relation Age of Onset  . Heart attack Father 15  . Stroke Father   . Kidney failure Sister        Bright's disease   . Cancer Paternal Aunt        d. 46s; NOS cancer of leg  . Kidney disease Sister        d. 84; "Bright's disease" - possible kidney cancer?  . Cancer Sister        NOS cancer; d. 26s; "sick in the hospital"  . Heart attack Son 70  . Leukemia Son 56  . COPD Son   . Breast cancer Daughter 60     ROS   ROS unable to assess clearly, AMS  Exam   Vitals:   12/17/19 0859 12/17/19 1239  BP: 124/72 126/77  Pulse:    Resp:    Temp:  98 F (36.7 C)  SpO2:     Appearance: elderly male, resting comfortably, Bruising BUE, Right metatarsal amputation Eyes: No scleral injection Cardiovascular: Regular rate and rhythm without murmurs, rubs, gallops. No edema or variciosities. Distal pulses normal. Pulmonary:  Effort normal, non-labored breathing Musculoskeletal:     Muscle tone upper extremities: Normal    Muscle tone lower extremities: Normal    Motor exam: 0/5 BUE, 0/5 LLE, 0/5 proximal RLE, 3/5 DF, PF  Neurological Mental Status:    - Patient is awake. Answers to name.     - Patient is unable to give a clear and coherent history.    - No signs of aphasia or neglect Cranial Nerves    - II: Visual Fields are full. PERRL    - III/IV/VI: EOMI without ptosis or diploplia.     - V: Facial sensation is grossly normal    - VII: Facial movement is symmetric.     - VIII: hearing is intact to voice    - X: Uvula elevates symmetrically    - XI: Shoulder shrug is symmetric.    - XII: tongue is midline without atrophy or fasciculations.  Sensory: Sensation grossly intact to LT Deep Tendon Reflexes    - 3+ and symmetric in  the biceps and patellae.   Results - Imaging/Labs   Results for orders placed or performed during the hospital encounter of 12/10/19 (from the past 48 hour(s))  Comprehensive metabolic panel     Status: Abnormal   Collection Time: 12/16/19  3:52 AM  Result Value Ref Range   Sodium 140 135 - 145 mmol/L   Potassium 3.0 (L) 3.5 - 5.1 mmol/L   Chloride 109 98 - 111 mmol/L   CO2 22 22 - 32 mmol/L   Glucose, Bld 132 (H) 70 - 99 mg/dL    Comment: Glucose reference range applies only to samples taken after fasting for at least 8 hours.   BUN 11 8 - 23 mg/dL   Creatinine, Ser 0.72 0.61 - 1.24 mg/dL   Calcium 8.1 (L) 8.9 - 10.3 mg/dL   Total Protein 5.2 (L) 6.5 - 8.1 g/dL   Albumin 1.4 (L) 3.5 - 5.0 g/dL   AST 37 15 - 41 U/L   ALT 54 (H) 0 - 44 U/L   Alkaline Phosphatase 98 38 - 126 U/L   Total Bilirubin 0.3 0.3 - 1.2 mg/dL   GFR calc non Af Amer >60 >60 mL/min   GFR calc Af Amer >60 >60 mL/min   Anion gap 9 5 - 15    Comment: Performed at [facility] [address], [place], [place] [address]  CBC     Status: Abnormal   Collection Time: 12/16/19  3:52 AM  Result Value Ref Range   WBC 15.2 (H) 4.0 - 10.5 K/uL   RBC 3.49 (L) 4.22 - 5.81 MIL/uL   Hemoglobin 9.1 (L) 13.0 - 17.0 g/dL   HCT 30.0 (L) 39.0 - 52.0 %   MCV 86.0 80.0 - 100.0 fL   MCH 26.1 26.0 - 34.0 pg   MCHC 30.3 30.0 - 36.0 g/dL   RDW 22.9 (H) 11.5 - 15.5 %   Platelets 385 150 - 400 K/uL   nRBC 0.0 0.0 - 0.2 %    Comment: Performed at [facility], [place] [address] [place], [place] [address]  Glucose, capillary     Status: Abnormal   Collection Time: 12/16/19  6:20 AM  Result Value Ref Range   Glucose-Capillary 107 (H) 70 - 99 mg/dL    Comment: Glucose reference range applies only to samples taken after fasting for at least 8 hours.   Comment 1 Notify RN    Comment 2 Document in  Chart   Magnesium     Status: Abnormal   Collection Time: 12/16/19  4:26 PM  Result Value Ref Range   Magnesium 1.4 (L) 1.7 -  2.4 mg/dL    Comment: Performed at [facility] [address] [place], [place] [address]  Phosphorus     Status: Abnormal   Collection Time: 12/16/19  4:26 PM  Result Value Ref Range   Phosphorus 1.5 (L) 2.5 - 4.6 mg/dL    Comment: Performed at [facility] [address], [place], [place] [address]  CBC     Status: Abnormal   Collection Time: 12/17/19  4:44 AM  Result Value Ref Range   WBC 12.4 (H) 4.0 - 10.5 K/uL   RBC 3.32 (L) 4.22 - 5.81 MIL/uL   Hemoglobin 8.7 (L) 13.0 - 17.0 g/dL   HCT 28.8 (L) 39.0 - 52.0 %   MCV 86.7 80.0 - 100.0 fL   MCH 26.2 26.0 - 34.0 pg   MCHC 30.2 30.0 - 36.0 g/dL   RDW 23.0 (H) 11.5 - 15.5 %   Platelets 342 150 - 400 K/uL   nRBC 0.0 0.0 - 0.2 %    Comment: Performed at [facility], [place] [address], [place], [place] [id number]  Basic metabolic panel     Status: Abnormal   Collection Time: 12/17/19  4:44 AM  Result Value Ref Range   Sodium 141 135 - 145 mmol/L   Potassium 3.2 (L) 3.5 - 5.1 mmol/L   Chloride 110 98 - 111 mmol/L   CO2 24 22 - 32 mmol/L   Glucose, Bld 122 (H) 70 - 99 mg/dL    Comment: Glucose reference range applies only to samples taken after fasting for at least 8 hours.   BUN 14 8 - 23 mg/dL   Creatinine, Ser 0.79 0.61 - 1.24 mg/dL   Calcium 8.0 (L) 8.9 - 10.3 mg/dL   GFR calc non Af Amer >60 >60 mL/min   GFR calc Af Amer >60 >60 mL/min   Anion gap 7 5 - 15    Comment: Performed at [facility] [address], [place], [place] [address]  Magnesium     Status: None   Collection Time: 12/17/19  4:44 AM  Result Value Ref Range   Magnesium 1.9 1.7 - 2.4 mg/dL    Comment: Performed at [facility] [address], [place],  [address]  Glucose, capillary     Status: Abnormal   Collection Time: 12/17/19  6:07 AM  Result Value Ref Range   Glucose-Capillary 117 (H) 70 - 99 mg/dL    Comment: Glucose reference range applies only to samples taken after fasting for at least 8 hours.   Comment 1  Notify RN    Comment 2 Document in Chart     MR BRAIN WO CONTRAST  Result Date: 12/17/2019 CLINICAL DATA:  Generalized weakness EXAM: MRI HEAD WITHOUT CONTRAST TECHNIQUE: Multiplanar, multiecho pulse sequences of the brain and surrounding structures were obtained without intravenous contrast. COMPARISON:  2018 FINDINGS: Brain: There is no acute infarction or hemorrhage. Confluent areas of T2 hyperintensity in the supratentorial white matter are nonspecific but likely reflect advanced chronic microvascular ischemic changes. There is a chronic left thalamic infarct with chronic blood products. Prominence of ventricles and sulci reflects moderate to marked parenchymal volume loss. Appearance is similar. There is no intracranial mass, mass effect, or edema. Vascular: Major vessel flow voids at the skull base  are preserved. Skull and upper cervical spine: Normal marrow signal is preserved. Sinuses/Orbits: Minor mucosal thickening.  Orbits are unremarkable. Other: Sella is unremarkable.  Mastoid air cells are clear. IMPRESSION: No acute infarction, hemorrhage, or mass. Stable chronic findings detailed above. Electronically Signed   By: Macy Mis M.D.   On: 12/17/2019 16:56   MR CERVICAL SPINE WO CONTRAST  Result Date: 12/17/2019 CLINICAL DATA:  Neck pain, upper extremity weakness EXAM: MRI CERVICAL SPINE WITHOUT CONTRAST TECHNIQUE: Multiplanar, multisequence MR imaging of the cervical spine was performed. No intravenous contrast was administered. COMPARISON:  CT 04/29/2018 FINDINGS: Technical note: Examination is significantly degraded by motion artifact. Alignment: 3 mm anterolisthesis C4 on C5. Trace retrolisthesis C5 on C6 and C6 on C7. Straightening and slight reversal of the cervical lordosis. Vertebrae: There is fluid signal within the C3-4 disc. Mild associated endplate marrow edema without definite bony destruction. There is prevertebral soft tissue edema along the anterior aspect of the cervical  spine most prevalent from C2-C4 (series 5, image 11). It is difficult to assess if there is any epidural fluid given the degree of motion artifact at this level. No fracture identified. Cord: Although there is extensive motion degradation, there appears to be increased signal within the cord at the C3 and C4 levels. Posterior Fossa, vertebral arteries, paraspinal tissues: Cerebral and cerebellar atrophy. Vertebral artery flow voids are present. Disc levels: C2-C3: Disc osteophyte complex and bilateral facet arthropathy resulting in moderate canal stenosis and mild bilateral foraminal stenosis, right worse than left. C3-C4: Disc osteophyte complex with bilateral facet and uncovertebral arthropathy resulting in severe canal stenosis with deformation of the cord. Mild-to-moderate right greater than left foraminal stenosis. C4-C5: Posterior disc osteophyte complex with bilateral facet and uncovertebral arthropathy resulting in mild-to-moderate canal stenosis and at least moderate bilateral foraminal stenosis. C5-C6: Posterior disc osteophyte complex with right worse than left facet and uncovertebral arthropathy resulting in mild canal stenosis and moderate bilateral foraminal stenosis. C6-C7: Posterior disc osteophyte complex and bilateral facet and uncovertebral arthropathy resulting in mild canal stenosis and left worse than right foraminal stenosis, likely at least moderate. C7-T1: Small posterior disc osteophyte complex without evidence of foraminal or canal stenosis. IMPRESSION: 1. Limited motion degradation examination. 2. Fluid signal within the C3-4 disc and prevertebral soft tissue edema along the anterior aspect of the cervical spine. Findings are concerning for discitis. No definite epidural fluid collection, although motion artifact significantly degrades evaluation of this level. A repeat pre and post-contrast MRI could be performed for further evaluation, as clinically indicated. 3. Severe C3-4 canal  stenosis with cord compression. Increased signal within the cord at this level may represent cord edema or myelomalacia. 4. Severe cervical spondylosis with multilevel mild and moderate canal stenosis, as detailed above. These results will be called to the ordering clinician or representative by the Radiologist Assistant, and communication documented in the PACS or [employer]. Electronically Signed   By: Davina Poke D.O.   On: 12/17/2019 16:06   DG CHEST PORT 1 VIEW  Result Date: 12/17/2019 CLINICAL DATA:  Patient with respiratory failure. EXAM: PORTABLE CHEST 1 VIEW COMPARISON:  Chest radiograph 12/15/2019. FINDINGS: Right upper extremity PICC line is present with tip projecting over the superior vena cava. Monitoring leads overlie the patient. Stable enlarged cardiac and mediastinal contours. Small layering bilateral pleural effusions. Similar-appearing diffuse bilateral airspace opacities. No pneumothorax. IMPRESSION: Similar-appearing diffuse bilateral airspace opacities and small bilateral pleural effusions. Electronically Signed   By: Lovey Newcomer M.D.   On:  12/17/2019 09:41   Impression/Plan   86 y.o. male currently admitted for sepsis secondary to enterococcus bacteremia, encephalopathy who was noted to not be moving his BUE today. MRI Cervical spine reveals severe cervical stenosis at C3-4. Hard to know if patient is agitated and not cooperating with exam, but appears nearly quadraplegic. I have discussed the situation with the son at length including the need for surgery and that there is a chance that despite surgery, he may have no improvement. After risks, benefits and alternatives discussed son wishes for [place] to proceed with decompression and fusion. Scheduled for tomorrow afternoon. - Npo at midnight - D/C all blood thinning medications  Ferne Reus, PA-C [facility] Neurosurgery and [employer]

## 2019-12-17 NOTE — NC FL2 (Signed)
[place] LEVEL OF CARE SCREENING TOOL     IDENTIFICATION  Patient Name: Derrick [name] Birthdate: 10-12-1931 Sex: male Admission Date (Current Location): 12/10/2019  [facility] and [place] Number:  Herbalist and Address:  [facility], [place] [address] [place], [place] [address]      Provider Number: [id number]  Attending Physician Name and Address:  Dessa Phi, DO  Relative Name and Phone Number:  [name]( son) [phone]    Current Level of Care: Hospital Recommended Level of Care: [place] Prior Approval Number:    Date Approved/Denied:   PASRR Number: [id number]  Discharge Plan: SNF    Current Diagnoses: Patient Active Problem List   Diagnosis Date Noted  . Enterococcal bacteremia 12/11/2019  . Cholecystitis, S/P drain 09-2019 12/11/2019  . Goals of care, counseling/discussion   . Palliative care by specialist   . Hydronephrosis of left kidney 12/10/2019  . AMS (altered mental status) 12/10/2019  . Depression 12/10/2019  . Atrial fibrillation with RVR ([place]) 09/19/2019  . Acute cholecystitis 09/18/2019  . E coli bacteremia 09/17/2019  . Sepsis ([place]) 09/16/2019  . Transaminitis 09/16/2019  . Nausea and vomiting 09/16/2019  . RA (rheumatoid arthritis) ([place]) 09/16/2019  . Arm pain 07/07/2019  . A-fib ([place]) 12/12/2018  . Acute blood loss anemia 11/28/2018  . Tachycardia 11/27/2018  . Gross hematuria 11/27/2018  . Acute metabolic encephalopathy [id number]  . Spinal stenosis, lumbar region with neurogenic claudication 11/26/2018  . Ulcer of right foot limited to breakdown of skin ([place])   . PAD (peripheral artery disease) ([place]) 09/23/2017  . Syncope and collapse 09/19/2017  . Osteomyelitis ([place])   . Malaise 09/09/2017  . Anorexia 09/09/2017  . Hypotension 09/09/2017  . Volume depletion 09/09/2017  . AKI (acute kidney injury) ([place]) 09/09/2017  . Hyponatremia 06/12/2017  . COPD (chronic obstructive  pulmonary disease) ([place])   . Lacunar infarction ([place])   . Gangrene of toe of right foot ([place])   . Pre-syncope 11/21/2016  . Dyspnea on exertion 03/03/2016  . H/O unilateral Rt nephrectomy 03/03/2016  . Asymmetrical right sensorineural hearing loss 02/24/2016  . Bilateral hearing loss 02/24/2016  . Bilateral impacted cerumen 02/24/2016  . Arrhythmia 11/09/2015  . CAD (coronary artery disease) 11/09/2015  . Abnormal ECG 10/22/2013  . Dyspnea 10/22/2013  . Chest pain 11/18/2011  . Unstable angina ([place]) 11/18/2011  . Thrombocytopenia ([place]) 11/18/2011  . Anemia 11/18/2011  . CKD (chronic kidney disease) 11/18/2011  . HTN (hypertension)   . High cholesterol   . GERD (gastroesophageal reflux disease)   . BPH (benign prostatic hyperplasia)     Orientation RESPIRATION BLADDER Height & Weight     Self  O2(nasal cannula 3L/min) Incontinent(urethral catheter) Weight: 182 lb (82.6 kg) Height:  5\' 9"  (175.3 cm)  BEHAVIORAL SYMPTOMS/MOOD NEUROLOGICAL BOWEL NUTRITION STATUS      Incontinent(Biliary drain, surgery on 4/5) Diet(see discharge summary)  AMBULATORY STATUS COMMUNICATION OF NEEDS Skin   Extensive Assist Verbally Other (Comment), Skin abrasions(abrasions and ecchymosis on arms, MASD perineum)                       Personal Care Assistance Level of Assistance  Bathing, Feeding, Dressing, Total care Bathing Assistance: Maximum assistance Feeding assistance: Maximum assistance Dressing Assistance: Maximum assistance Total Care Assistance: Maximum assistance   Functional Limitations Info  Sight, Speech, Hearing Sight Info: Adequate Hearing Info: Adequate Speech Info: Adequate    SPECIAL CARE FACTORS FREQUENCY  PT (By licensed PT), OT (By licensed OT)     PT Frequency: min 5x weekly OT Frequency: min 5x weekly            Contractures Contractures Info: Not present    Additional Factors Info  Code Status, Allergies Code Status Info: full Allergies Info: Effexor  (venlafaxine), lisinopril, paxil (paroxetine), viagra (sildenafil), wellbutrin (bupropion)           Current Medications (12/17/2019):  This is the current hospital active medication list Current Facility-Administered Medications  Medication Dose Route Frequency Provider Last Rate Last Admin  . 0.9 %  sodium chloride infusion   Intravenous PRN Isaac Bliss, Rayford Halsted, MD 10 mL/hr at 12/17/19 0418 250 mL at 12/17/19 0418  . acetaminophen (TYLENOL) tablet 650 mg  650 mg Oral Q6H PRN Pahwani, Rinka R, MD   650 mg at 12/16/19 1002   Or  . acetaminophen (TYLENOL) suppository 650 mg  650 mg Rectal Q6H PRN Pahwani, Rinka R, MD      . ampicillin (OMNIPEN) 2 g in sodium chloride 0.9 % 100 mL IVPB  2 g Intravenous Q4H Susa Raring, RPH 300 mL/hr at 12/17/19 1127 2 g at 12/17/19 1127  . chlorhexidine (PERIDEX) 0.12 % solution 15 mL  15 mL Mouth Rinse BID Dessa Phi, DO   15 mL at 12/17/19 0913  . Chlorhexidine Gluconate Cloth 2 % PADS 6 each  6 each Topical Daily Pahwani, Michell Heinrich, MD   6 each at 12/17/19 0914  . diclofenac Sodium (VOLTAREN) 1 % topical gel 2 g  2 g Topical BID PRN Isaac Bliss, Rayford Halsted, MD   2 g at 12/16/19 [phone]  . diltiazem (CARDIZEM) tablet 90 mg  90 mg Oral Q6H Dessa Phi, DO   90 mg at 12/17/19 0859  . enoxaparin (LOVENOX) injection 40 mg  40 mg Subcutaneous Q24H Pahwani, Rinka R, MD   40 mg at 12/16/19 1750  . feeding supplement (ENSURE ENLIVE) (ENSURE ENLIVE) liquid 237 mL  237 mL Oral TID BM Isaac Bliss, Rayford Halsted, MD   237 mL at 12/16/19 2231  . fentaNYL (SUBLIMAZE) injection 12.5 mcg  12.5 mcg Intravenous Once Dessa Phi, DO   Stopped at 12/16/19 1858  . furosemide (LASIX) injection 40 mg  40 mg Intravenous Q12H Isaac Bliss, Rayford Halsted, MD   40 mg at 12/17/19 0913  . hydrALAZINE (APRESOLINE) injection 10 mg  10 mg Intravenous Q6H PRN Pahwani, Rinka R, MD      . ketorolac (TORADOL) 15 MG/ML injection 15 mg  15 mg Intravenous Q8H PRN Dessa Phi,  DO   15 mg at 12/16/19 2359  . MEDLINE mouth rinse  15 mL Mouth Rinse q12n4p Dessa Phi, DO   15 mL at 12/17/19 1235  . multivitamin with minerals tablet 1 tablet  1 tablet Oral Daily Isaac Bliss, Rayford Halsted, MD   1 tablet at 12/17/19 0913  . Resource ThickenUp Clear   Oral PRN Dessa Phi, DO      . sodium chloride flush (NS) 0.9 % injection 10-40 mL  10-40 mL Intracatheter Q12H Isaac Bliss, Rayford Halsted, MD   10 mL at 12/16/19 1145  . sodium chloride flush (NS) 0.9 % injection 10-40 mL  10-40 mL Intracatheter PRN Isaac Bliss, Rayford Halsted, MD      . sodium chloride flush (NS) 0.9 % injection 10-40 mL  10-40 mL Intracatheter Q12H Isaac Bliss, Rayford Halsted, MD   10 mL at 12/16/19 2230  . sodium chloride  flush (NS) 0.9 % injection 10-40 mL  10-40 mL Intracatheter PRN Isaac Bliss, Rayford Halsted, MD         Discharge Medications: Please see discharge summary for a list of discharge medications.  Relevant Imaging Results:  Relevant Lab Results:   Additional Information SSN: [id number]  Alberteen Sam, LCSW

## 2019-12-17 NOTE — Progress Notes (Signed)
PROGRESS NOTE    Derrick [name]  [id number] DOB: 1932/02/04 DOA: 12/10/2019 PCP: [name], MD     Brief Narrative:  Derrick [name] is an 86 year old man admitted from home on 3/4 with complaints of generalized weakness, lethargy, anorexia and abdominal pain for the past 2 weeks.  Per son, patient has had decreased urinary output but no fevers or chills.  His past medical history significant for a recent admission in December for sepsis secondary to acute cholecystitis status post percutaneous drainage by interventional radiology, hypertension, stage III chronic kidney disease, right renal cell carcinoma status post right nephrectomy.  CT scan in the emergency department was concerning for left hydroureteronephrosis and signs of an upper UTI.  Admission was requested for further evaluation and management.  New events last 24 hours / Subjective: Able to answer some questions.  Follows directions appropriately.  He states that he has not been able to move his right arm "for a little while."  When pressed, he thinks it has been a few days.  In speaking with his son over the phone, son states that patient has had some weakness in his lower extremities for months as well as some weakness in his upper extremities for the last week or so.  At baseline, patient is able to have a conversation although is forgetful with short-term memory, has been ambulatory with a walker although has been falling more recently at home.  Patient also admits to midline neck pain  Assessment & Plan:   Principal Problem:   Enterococcal bacteremia Active Problems:   HTN (hypertension)   Anemia   CKD (chronic kidney disease)   Sepsis (HCC)   RA (rheumatoid arthritis) (HCC)   Atrial fibrillation with RVR (HCC)   Hydronephrosis of left kidney   AMS (altered mental status)   Depression   Cholecystitis, S/P drain 09-2019   Goals of care, counseling/discussion   Palliative care by specialist    Sepsis secondary to  Enterococcus bacteremia -Thought urinary source as urine cx also positive -Appreciate ID -Urology is recommending repeat renal US 3/7: No residual hydroureteronephrosis, clot in the bladder lumen with distended bladder despite foley catheter. He has a h/o prostate cancer.  Follow-up with Dr. Jeffie Pollock as an outpatient -TTE without vegetations. -Repeat blood culture drawn on 3/6 remain negative. If they turn positive may need to consider TEE to r/o endocarditis. -Plan to continue IV ampicillin, per ID plan to continue until 4/3 -Given advanced age and multiple medical comorbidities, appreciate palliative care following as well. Per son, full scope of treatment for now  Acute pulmonary edema -CXR 3/9 showed patchy bilateral infiltrates. Started on lasix -Repeat CXR 3/11 reviewed independently, bilateral interstitial edema -Continue Lasix  Acute hypoxemic respiratory failure -Secondary to above, remains on 3 L nasal cannula O2 this morning  Bilateral upper extremity weakness  -Obtain MRI brain and cervical spine  Acute metabolic encephalopathy -Likely due to sepsis -CT head is negative for CVA on admission 3/4 -Repeat CT head negative 3/9 -Seems to be improving overall  Atrial fibrillation with RVR -In the setting of sepsis -He is not on anticoagulation at home despite high CHADSVASC score, suspect on account of age, frailty and deconditioning -Cardizem drip to p.o. Cardizem.  Rate in the 90s today  Recent history of cholecystitis with percutaneous drainage -Appreciate general surgery consultation.  Scheduled for outpatient cholecystectomy 4/5  Rheumatoid arthritis -At home on prednisone and Plaquenil - currently on hold during sepsis treatment   History of right renal  cell carcinoma -Status post nephrectomy  Hypokalemia -Replace, trend     DVT prophylaxis: Lovenox Code Status: Full Family Communication: None at bedside; discussed with son over the phone Disposition  Plan:  . Patient is from home prior to admission. . Currently in-hospital treatment needed due to A Fib RVR as well as pulmonary edema.  Work-up for bilateral upper extremity weakness today . Suspect patient will discharge to possibly SNF in 2 to 3 days   Consultants:   ID  Urology  Palliative care    Antimicrobials:  Anti-infectives (From admission, onward)   Start     Dose/Rate Route Frequency Ordered Stop   12/11/19 1200  cefTRIAXone (ROCEPHIN) 1 g in sodium chloride 0.9 % 100 mL IVPB  Status:  Discontinued     1 g 200 mL/hr over 30 Minutes Intravenous  Once 12/10/19 1831 12/11/19 0143   12/11/19 0800  ampicillin (OMNIPEN) 2 g in sodium chloride 0.9 % 100 mL IVPB     2 g 300 mL/hr over 20 Minutes Intravenous Every 4 hours 12/11/19 0143 01/09/20 2359   12/11/19 0200  ampicillin (OMNIPEN) 2 g in sodium chloride 0.9 % 100 mL IVPB     2 g 300 mL/hr over 20 Minutes Intravenous NOW 12/11/19 0143 12/11/19 0401   12/10/19 1145  cefTRIAXone (ROCEPHIN) 2 g in sodium chloride 0.9 % 100 mL IVPB     2 g 200 mL/hr over 30 Minutes Intravenous  Once 12/10/19 1138 12/10/19 1240   12/10/19 1145  metroNIDAZOLE (FLAGYL) IVPB 500 mg     500 mg 100 mL/hr over 60 Minutes Intravenous  Once 12/10/19 1139 12/10/19 1254       Objective: Vitals:   12/17/19 0404 12/17/19 0549 12/17/19 0801 12/17/19 0859  BP: 98/64   124/72  Pulse: 90     Resp: 18     Temp: 98.2 F (36.8 C)  98 F (36.7 C)   TempSrc: Oral     SpO2: 99%     Weight:  82.6 kg    Height:        Intake/Output Summary (Last 24 hours) at 12/17/2019 1109 Last data filed at 12/17/2019 0900 Gross per 24 hour  Intake 1963.37 ml  Output 1800 ml  Net 163.37 ml   Filed Weights   12/15/19 0451 12/16/19 0349 12/17/19 0549  Weight: 85.5 kg 83.7 kg 82.6 kg    Examination: General exam: Appears calm and comfortable  Respiratory system: Clear to auscultation anteriorly. Respiratory effort normal.  On nasal cannula O2 without  distress noted Cardiovascular system: S1 & S2 heard, irregular rhythm, rate 90s. No pedal edema. Gastrointestinal system: Abdomen is nondistended, soft and nontender. Normal bowel sounds heard. Central nervous system: Alert, bilateral upper extremity weakness, worse on the right +0/5, left +1/5.  Bilateral lower extremity weakness +3/5 Extremities: Symmetric in appearance bilaterally  Skin: No rashes, lesions or ulcers on exposed skin     Data Reviewed: I have personally reviewed following labs and imaging studies  CBC: Recent Labs  Lab 12/10/19 1138 12/10/19 1821 12/13/19 0344 12/14/19 0421 12/15/19 0405 12/16/19 0352 12/17/19 0444  WBC 18.6*   < > 10.0 11.6* 15.6* 15.2* 12.4*  NEUTROABS 16.1*  --   --   --   --   --   --   HGB 11.8*   < > 9.4* 9.8* 10.0* 9.1* 8.7*  HCT 37.9*   < > 30.5* 32.1* 32.7* 30.0* 28.8*  MCV 84.4   < > 85.0 85.6 85.4  86.0 86.7  PLT 379   < > 349 379 379 385 342   < > = values in this interval not displayed.   Basic Metabolic Panel: Recent Labs  Lab 12/10/19 1821 12/11/19 0330 12/13/19 0344 12/14/19 0421 12/15/19 0405 12/16/19 0352 12/16/19 1626 12/17/19 0444  NA  --    < > 142 142 144 140  --  141  K  --    < > 3.5 3.4* 3.3* 3.0*  --  3.2*  CL  --    < > 115* 116* 117* 109  --  110  CO2  --    < > 15* 17* 17* 22  --  24  GLUCOSE  --    < > 119* 115* 160* 132*  --  122*  BUN  --    < > 18 14 15 11   --  14  CREATININE 1.24   < > 0.80 0.76 0.68 0.72  --  0.79  CALCIUM  --    < > 8.1* 8.1* 8.1* 8.1*  --  8.0*  MG 2.3  --   --   --   --   --  1.4* 1.9  PHOS 3.7  --   --   --   --   --  1.5*  --    < > = values in this interval not displayed.   GFR: Estimated Creatinine Clearance: 67.5 mL/min (by C-G formula based on SCr of 0.79 mg/dL). Liver Function Tests: Recent Labs  Lab 12/10/19 1138 12/11/19 0330 12/12/19 0514 12/14/19 0421 12/16/19 0352  AST 63* 41 32 43* 37  ALT 75* 51* 40 48* 54*  ALKPHOS 126 140* 101 105 98  BILITOT 1.1  1.0 0.6 0.7 0.3  PROT 7.0 5.7* 5.3* 5.4* 5.2*  ALBUMIN 2.6* 2.0* 1.8* 1.6* 1.4*   No results for input(s): LIPASE, AMYLASE in the last 168 hours. No results for input(s): AMMONIA in the last 168 hours. Coagulation Profile: Recent Labs  Lab 12/10/19 1138  INR 1.2   Cardiac Enzymes: No results for input(s): CKTOTAL, CKMB, CKMBINDEX, TROPONINI in the last 168 hours. BNP (last 3 results) No results for input(s): PROBNP in the last 8760 hours. HbA1C: No results for input(s): HGBA1C in the last 72 hours. CBG: Recent Labs  Lab 12/11/19 0810 12/14/19 0623 12/15/19 0449 12/16/19 0620 12/17/19 0607  GLUCAP 105* 99 139* 107* 117*   Lipid Profile: No results for input(s): CHOL, HDL, LDLCALC, TRIG, CHOLHDL, LDLDIRECT in the last 72 hours. Thyroid Function Tests: No results for input(s): TSH, T4TOTAL, FREET4, T3FREE, THYROIDAB in the last 72 hours. Anemia Panel: No results for input(s): VITAMINB12, FOLATE, FERRITIN, TIBC, IRON, RETICCTPCT in the last 72 hours. Sepsis Labs: Recent Labs  Lab 12/10/19 1138  LATICACIDVEN 1.4    Recent Results (from the past 240 hour(s))  Blood Culture (routine x 2)     Status: Abnormal   Collection Time: 12/10/19 11:30 AM   Specimen: BLOOD  Result Value Ref Range Status   Specimen Description BLOOD RIGHT ANTECUBITAL  Final   Special Requests   Final    BOTTLES DRAWN AEROBIC AND ANAEROBIC Blood Culture adequate volume   Culture  Setup Time   Final    IN BOTH AEROBIC AND ANAEROBIC BOTTLES CRITICAL RESULT CALLED TO, READ BACK BY AND VERIFIED WITH: K AMEND PHARMD 12/11/19 0125 JDW GRAM POSITIVE COCCI Performed at [facility], [place] [address], [place], [place] [address]    Culture ENTEROCOCCUS FAECALIS (A)  Final   Report Status 12/13/2019 FINAL  Final   Organism ID, Bacteria ENTEROCOCCUS FAECALIS  Final      Susceptibility   Enterococcus faecalis - MIC*    AMPICILLIN <=2 SENSITIVE Sensitive     VANCOMYCIN 1 SENSITIVE Sensitive      GENTAMICIN SYNERGY SENSITIVE Sensitive     * ENTEROCOCCUS FAECALIS  Blood Culture ID Panel (Reflexed)     Status: Abnormal   Collection Time: 12/10/19 11:30 AM  Result Value Ref Range Status   Enterococcus species DETECTED (A) NOT DETECTED Final    Comment: CRITICAL RESULT CALLED TO, READ BACK BY AND VERIFIED WITH: K AMEND PHARMD 12/11/19 0125 JDW    Vancomycin resistance NOT DETECTED NOT DETECTED Final   Listeria monocytogenes NOT DETECTED NOT DETECTED Final   Staphylococcus species NOT DETECTED NOT DETECTED Final   Staphylococcus aureus (BCID) NOT DETECTED NOT DETECTED Final   Streptococcus species NOT DETECTED NOT DETECTED Final   Streptococcus agalactiae NOT DETECTED NOT DETECTED Final   Streptococcus pneumoniae NOT DETECTED NOT DETECTED Final   Streptococcus pyogenes NOT DETECTED NOT DETECTED Final   Acinetobacter baumannii NOT DETECTED NOT DETECTED Final   Enterobacteriaceae species NOT DETECTED NOT DETECTED Final   Enterobacter cloacae complex NOT DETECTED NOT DETECTED Final   Escherichia coli NOT DETECTED NOT DETECTED Final   Klebsiella oxytoca NOT DETECTED NOT DETECTED Final   Klebsiella pneumoniae NOT DETECTED NOT DETECTED Final   Proteus species NOT DETECTED NOT DETECTED Final   Serratia marcescens NOT DETECTED NOT DETECTED Final   Haemophilus influenzae NOT DETECTED NOT DETECTED Final   Neisseria meningitidis NOT DETECTED NOT DETECTED Final   Pseudomonas aeruginosa NOT DETECTED NOT DETECTED Final   Candida albicans NOT DETECTED NOT DETECTED Final   Candida glabrata NOT DETECTED NOT DETECTED Final   Candida krusei NOT DETECTED NOT DETECTED Final   Candida parapsilosis NOT DETECTED NOT DETECTED Final   Candida tropicalis NOT DETECTED NOT DETECTED Final    Comment: Performed at [facility], [address] [place], [place] [address]  Blood Culture (routine x 2)     Status: Abnormal   Collection Time: 12/10/19 11:37 AM   Specimen: BLOOD LEFT HAND  Result Value Ref  Range Status   Specimen Description BLOOD LEFT HAND  Final   Special Requests   Final    BOTTLES DRAWN AEROBIC AND ANAEROBIC Blood Culture adequate volume   Culture  Setup Time   Final    IN BOTH AEROBIC AND ANAEROBIC BOTTLES GRAM POSITIVE COCCI IN CHAINS CRITICAL VALUE NOTED.  VALUE IS CONSISTENT WITH PREVIOUSLY REPORTED AND CALLED VALUE.    Culture (A)  Final    ENTEROCOCCUS FAECALIS SUSCEPTIBILITIES PERFORMED ON PREVIOUS CULTURE WITHIN THE LAST 5 DAYS. Performed at [facility], [place] [address], [place], [place] [address]    Report Status 12/13/2019 FINAL  Final  Urine culture     Status: Abnormal   Collection Time: 12/10/19  1:50 PM   Specimen: In/Out Cath Urine  Result Value Ref Range Status   Specimen Description IN/OUT CATH URINE  Final   Special Requests   Final    NONE Performed at [facility], [place] [address] [place],  [address]    Culture >=100,000 COLONIES/mL ENTEROCOCCUS FAECALIS (A)  Final   Report Status 12/12/2019 FINAL  Final   Organism ID, Bacteria ENTEROCOCCUS FAECALIS (A)  Final      Susceptibility   Enterococcus faecalis - MIC*    AMPICILLIN <=2 SENSITIVE  Sensitive     NITROFURANTOIN <=16 SENSITIVE Sensitive     VANCOMYCIN 1 SENSITIVE Sensitive     * >=100,000 COLONIES/mL ENTEROCOCCUS FAECALIS  SARS CORONAVIRUS 2 (TAT 6-24 HRS) Nasopharyngeal Nasopharyngeal Swab     Status: None   Collection Time: 12/10/19  7:02 PM   Specimen: Nasopharyngeal Swab  Result Value Ref Range Status   SARS Coronavirus 2 NEGATIVE NEGATIVE Final    Comment: (NOTE) SARS-CoV-2 target nucleic acids are NOT DETECTED. The SARS-CoV-2 RNA is generally detectable in upper and lower respiratory specimens during the acute phase of infection. Negative results do not preclude SARS-CoV-2 infection, do not rule out co-infections with other pathogens, and should not be used as the sole basis for treatment or other patient management decisions. Negative results must be  combined with clinical observations, patient history, and epidemiological information. The expected result is Negative. Fact Sheet for Patients: SugarRoll.be Fact Sheet for Healthcare Providers: https://www.woods-mathews.com/ This test is not yet approved or cleared by the [place] FDA and  has been authorized for detection and/or diagnosis of SARS-CoV-2 by FDA under an Emergency Use Authorization (EUA). This EUA will remain  in effect (meaning this test can be used) for the duration of the COVID-19 declaration under Section 56 4(b)(1) of the Act, 21 U.S.C. section 360bbb-3(b)(1), unless the authorization is terminated or revoked sooner. Performed at [facility], [place] [address] [place], [place] [address]   Culture, blood (routine x 2)     Status: None   Collection Time: 12/12/19  5:16 AM   Specimen: BLOOD RIGHT HAND  Result Value Ref Range Status   Specimen Description BLOOD RIGHT HAND  Final   Special Requests   Final    BOTTLES DRAWN AEROBIC AND ANAEROBIC Blood Culture adequate volume   Culture   Final    NO GROWTH 5 DAYS Performed at [facility], [place] [address], [place], [place] [address]    Report Status 12/17/2019 FINAL  Final  Culture, blood (routine x 2)     Status: None   Collection Time: 12/12/19  5:20 AM   Specimen: BLOOD LEFT HAND  Result Value Ref Range Status   Specimen Description BLOOD LEFT HAND  Final   Special Requests   Final    BOTTLES DRAWN AEROBIC AND ANAEROBIC Blood Culture adequate volume   Culture   Final    NO GROWTH 5 DAYS Performed at [facility], [place] [address], [place], [place] [address]    Report Status 12/17/2019 FINAL  Final      Radiology Studies: CT HEAD WO CONTRAST  Result Date: 12/15/2019 CLINICAL DATA:  Focal neuro deficit.  Sepsis. EXAM: CT HEAD WITHOUT CONTRAST TECHNIQUE: Contiguous axial images were obtained from the base of the skull through the vertex without  intravenous contrast. COMPARISON:  CT head 12/10/2019 FINDINGS: Brain: Advanced atrophy with ventricular enlargement unchanged. Extensive chronic microvascular ischemic change throughout the white matter unchanged. 2.5 x 3.5 cm arachnoid cyst left parietal convexity unchanged. Negative for acute infarct, hemorrhage, mass. Vascular: Negative for hyperdense vessel. Atherosclerotic calcification in the carotid and vertebral arteries. Skull: Negative Sinuses/Orbits: Paranasal sinuses clear.  No orbital lesion. Other: None IMPRESSION: No acute abnormality and no change from the recent CT Extensive atrophy and severe chronic white matter ischemia. Electronically Signed   By: Franchot Gallo M.D.   On: 12/15/2019 13:53   DG CHEST PORT 1 VIEW  Result Date: 12/17/2019 CLINICAL DATA:  Patient with respiratory failure. EXAM: PORTABLE CHEST  1 VIEW COMPARISON:  Chest radiograph 12/15/2019. FINDINGS: Right upper extremity PICC line is present with tip projecting over the superior vena cava. Monitoring leads overlie the patient. Stable enlarged cardiac and mediastinal contours. Small layering bilateral pleural effusions. Similar-appearing diffuse bilateral airspace opacities. No pneumothorax. IMPRESSION: Similar-appearing diffuse bilateral airspace opacities and small bilateral pleural effusions. Electronically Signed   By: Lovey Newcomer M.D.   On: 12/17/2019 09:41      Scheduled Meds: . chlorhexidine  15 mL Mouth Rinse BID  . Chlorhexidine Gluconate Cloth  6 each Topical Daily  . diltiazem  90 mg Oral Q6H  . enoxaparin (LOVENOX) injection  40 mg Subcutaneous Q24H  . feeding supplement (ENSURE ENLIVE)  237 mL Oral TID BM  . fentaNYL (SUBLIMAZE) injection  12.5 mcg Intravenous Once  . furosemide  40 mg Intravenous Q12H  . mouth rinse  15 mL Mouth Rinse q12n4p  . multivitamin with minerals  1 tablet Oral Daily  . sodium chloride flush  10-40 mL Intracatheter Q12H  . sodium chloride flush  10-40 mL Intracatheter Q12H    Continuous Infusions: . sodium chloride 250 mL (12/17/19 0418)  . ampicillin (OMNIPEN) IV 2 g (12/17/19 0857)  . potassium chloride 10 mEq (12/17/19 1005)     LOS: 7 days      Time spent: 45 minutes   Dessa Phi, DO [facility] 12/17/2019, 11:09 AM   Available via Epic secure chat 7am-7pm After these hours, please refer to coverage provider listed on [email or web address]

## 2019-12-17 NOTE — Progress Notes (Signed)
BP at 0400 87/43.  Patient still in afib upper 90s to 100s.  Cardizem drip lowered to 10/hr.  BP repeat at 0404 98/64.  APP notified.

## 2019-12-17 NOTE — Progress Notes (Signed)
Occupational Therapy Treatment Patient Details Name: Derrick [name] MRN: [id number] DOB: 11-27-1931 Today's Date: 12/17/2019    History of present illness Patient is a 86 y/o male who presents with AMS, lethargy and weakness. Admitted with sepsis in the setting of hydroureteronephrosis of left kidney. Also with A-fib with RVR. Pt with recent gallbladder surgery. PMH includes right renal carcinoma s/p right nephrectomy, cholecystitis s/p cholecystectomy and tube placement, CVA, HTN, RA, memory loss, CKD, CAD, PAD, back surgery, and right TM amputation.   OT comments  Pt not progressing towards OT goals this session. Pt able to answer questions with one word phrases (very soft voice) no AROM in BUE - full PROM exercises performed (see below) as edema noted, gentle massage and repositioning for edema management. Wife in room at end of session. Current POC appropriate, appreciate Palliative input and care. OT will follow acutely and SNF will be needed should the family pursue aggressive treatment. Goals may need to be re-evaluated next session due to lack of progress.   Follow Up Recommendations  SNF    Equipment Recommendations  Other (comment)(defer to next venue)    Recommendations for Other Services      Precautions / Restrictions Precautions Precautions: Fall Restrictions Weight Bearing Restrictions: No       Mobility Bed Mobility Overal bed mobility: Needs Assistance   Rolling: Total assist;+2 for physical assistance         General bed mobility comments: total A for all aspects of bed mobility  Transfers                      Balance                                           ADL either performed or assessed with clinical judgement   ADL Overall ADL's : Needs assistance/impaired Eating/Feeding: Total assistance Eating/Feeding Details (indicate cue type and reason): assisted to drink nectar thick water small sips from spoon Grooming: Total  assistance;Wash/dry Biomedical engineer      Cognition Arousal/Alertness: Awake/alert Behavior During Therapy: Flat affect Overall Cognitive Status: Impaired/Different from baseline Area of Impairment: Memory;Following commands                     Memory: Decreased short-term memory Following Commands: Follows one step commands consistently       General Comments: Pt responding appropriately to questions, extreme weakness, and soft voice        Exercises Exercises: General Upper Extremity General Exercises - Upper Extremity Shoulder Flexion: PROM;Right;Left;10 reps;Supine(HOB elevated) Shoulder Extension: PROM;Right;Left;10 reps;Supine(HOb elevated) Shoulder ABduction: PROM;Right;Left;10 reps;Supine(HOB elevated) Elbow Flexion: PROM;Right;Left;10 reps;Supine Elbow Extension: PROM;Right;Left;10 reps;Supine Wrist Flexion: PROM;Right;Left;10 reps;Supine Wrist Extension: PROM;Right;Left;10 reps;Supine Digit Composite Flexion: PROM;Right;Left;10 reps;Supine Composite Extension: PROM;Right;Left;10 reps;Supine   Shoulder Instructions       General Comments Pt found with O2 off and SpO2 98%, left off and VSS throughout session.     Pertinent Vitals/ Pain       Pain Assessment: Faces Faces Pain Scale: Hurts a little bit Pain Location: unspecified Pain Descriptors /  Indicators: Grimacing Pain Intervention(s): Limited activity within patient's tolerance;Monitored during session;Repositioned;Other (comment)(elevation of BUE for edema management)  Home Living Family/patient expects to be discharged to:: Skilled nursing facility                                        Prior Functioning/Environment              Frequency  Min 2X/week        Progress Toward Goals  OT Goals(current goals can now be found in the care plan section)  Progress towards OT goals: Not  progressing toward goals - comment(limited by weakness, fatigue)  Acute Rehab OT Goals Patient Stated Goal: none stated OT Goal Formulation: Patient unable to participate in goal setting Time For Goal Achievement: 12/25/19 Potential to Achieve Goals: [place] Discharge plan remains appropriate    Co-evaluation                 AM-PAC OT "6 Clicks" Daily Activity     Outcome Measure   Help from another person eating meals?: Total Help from another person taking care of personal grooming?: Total Help from another person toileting, which includes using toliet, bedpan, or urinal?: Total Help from another person bathing (including washing, rinsing, drying)?: Total Help from another person to put on and taking off regular upper body clothing?: Total Help from another person to put on and taking off regular lower body clothing?: Total 6 Click Score: 6    End of Session    OT Visit Diagnosis: Other abnormalities of gait and mobility (R26.89);Muscle weakness (generalized) (M62.81);Other symptoms and signs involving cognitive function   Activity Tolerance Patient tolerated treatment well;Patient limited by fatigue   Patient Left in bed;with call bell/phone within reach;with bed alarm set;with family/visitor present   Nurse Communication Mobility status(O2 off)        Time: [id number] OT Time Calculation (min): 24 min  Charges: OT General Charges $OT Visit: 1 Visit OT Treatments $Self Care/Home Management : 8-22 mins $Therapeutic Exercise: 8-22 mins  Jesse Sans OTR/L Acute Rehabilitation Services Pager: [phone] Office: [place] 12/17/2019, 1:54 PM

## 2019-12-17 NOTE — Progress Notes (Signed)
   Vital Signs MEWS/VS Documentation      12/16/2019 1900 12/16/2019 1954 12/16/2019 2323 12/17/2019 0000   MEWS Score:  3  1  1  2    MEWS Score Color:  Yellow  Green  Green  Yellow   Resp:  --  (!) 22  --  (!) 23   Pulse:  --  89  --  97   BP:  --  103/65  --  107/64   Temp:  --  98.5 F (36.9 C)  --  98.9 F (37.2 C)   Level of Consciousness:  --  --  Alert  --     This is not an acute change for the patient.  Patient has been yellow due to RR and HR since admission. Patient on cardizem IV at this time.       [facility] 12/17/2019,2:59 AM

## 2019-12-17 NOTE — Progress Notes (Signed)
  Speech Language Pathology Treatment: Dysphagia  Patient Details Name: Derrick [name] MRN: [id number] DOB: January 11, 1932 Today's Date: 12/17/2019 Time: [id number] SLP Time Calculation (min) (ACUTE ONLY): 15 min  Assessment / Plan / Recommendation Clinical Impression  Pt seen with RN in room administering medications, together we were able to awaken pt sufficiently, though he remained profoundly weak. Able to provide careful total assist feeding with am meal; pt tolerated nectar and puree with appropriate position, controlled bolus size, following bites with sips, though intake was perhaps only 5-7 oz total. He fatigues quickly, reports pain. Trials of thin liquids resulted in very weak coughing. Recommend continuing nectar thick liquids and purees for now. Pt not likely to tolerate MBS well and not expected to yield any improved result than nectar and puree at this time. Will f/u next for potential to participate in MBS when/if pt might have improved tolerance for procedure   HPI HPI: Pt is an 86 y.o. male with medical history significant of rheumatoid arthritis on prednisone, Plaquenil, coronary artery disease, PAD, depression, hypertension, CKD stage III, right renal carcinoma status post right nephrectomy, cholecystitis status post cholecystectomy and tube placement by IR who presented to the ED with generalized weakness, lethargic, loss of appetite and abdominal pain since 2 weeks. CT of the head negative for acute changes. Extensive bilateral corona radiata white matter disease. Chest x-ray 3/4: no acute disease.      SLP Plan          Recommendations  Diet recommendations: Dysphagia 1 (puree);Nectar-thick liquid Liquids provided via: Cup Medication Administration: Crushed with puree Supervision: Full supervision/cueing for compensatory strategies Compensations: Minimize environmental distractions;Small sips/bites Postural Changes and/or Swallow Maneuvers: Seated upright 90 degrees;Upright  30-60 min after meal                Oral Care Recommendations: Oral care BID Follow up Recommendations: Other (comment) SLP Visit Diagnosis: Dysphagia, oropharyngeal phase (R13.12)       GO               Derrick [place], MA CCC-SLP  Acute Rehabilitation Services Pager [phone] Office [phone]  Derrick [name] 12/17/2019, 9:31 AM

## 2019-12-17 NOTE — Progress Notes (Signed)
Palliative:  HPI: 86 y.o.malewith past medical history of CAD, PAD, HTN, CKD III, right renal carcinoma s/p right nephrectomy, recent admission in December for sepsis secondary to cholecystitis s/p percutaneous drainage and scheduled for cholecystectomy next monthwho was admitted on 3/4/2021with progressive weakness, anorexia and lethargy.He was found to be bacteremic with enterococcus secondary to urinary source.Hospitalization complicated by ongoing issues with lethargy, atrial fibrillation, pulmonary edema, dysphagia.   I met today at Mr. Po bedside. No family at bedside. He is alert and is nodding his head yes/no. He is engaged in conversation. He attempts to try and speak with lips moving but not able to actually produce voice or noise. He is not eating well but had a little bit of fluids. He has had rapid response calls last couple days with concern for tachycardia and tachypnea. Diet has also been changed to dysphagia 1, nectar thick fluids.   I called and spoke with son, [name], who my colleagues have previously spoken with. I provided introduction of myself and follow up from palliative care. Family are still hopeful for slow improvement at this time. [name] reports that he has discussed with Dr. [name] concern for muscle stiffness and weakness with concern for pinched nerve or neck/back etiology.   I discussed with him my interaction with his father and I was happy to find him so alert. I expressed my concern for more limited diet dys 1, nectar thick and that this is not appetizing and is difficult to meet nutritional and hydration needs if this diet is not able to be downgraded. I also voiced concern that although his CT head shows no acute changes it does show "extensive atrophy and severe chronic white matter ischemia" which concerns me that this could be contributing to his slow recovery, lethargy, and dysphagia. Frank verbalizes understanding. [name] agrees that I can touch base with him  again tomorrow with updates.   All questions/concerns addressed. Emotional support provided.   Exam: Alert, confused. No distress. Engaged in conversation. Breathing regular, unlabored. Abd soft, flat. Generalized weakness. No movement of extremities.   Plan: - [place] conversations ongoing. Will continue conversation tomorrow with update.   [place], NP Palliative Medicine Team Pager [phone] (Please see [email or web address] for schedule) Team Phone [phone]    Greater than 50%  of this time was spent counseling and coordinating care related to the above assessment and plan

## 2019-12-18 ENCOUNTER — Encounter (HOSPITAL_COMMUNITY)
Admission: EM | Disposition: A | Discharge status: Hospice | Payer: Self-pay | Source: Home / Self Care | Attending: Internal Medicine

## 2019-12-18 DIAGNOSIS — Z66 Do not resuscitate: Secondary | ICD-10-CM

## 2019-12-18 LAB — BASIC METABOLIC PANEL
Anion gap: 12 (ref 5–15)
BUN: 13 mg/dL (ref 8–23)
CO2: 24 mmol/L (ref 22–32)
Calcium: 8 mg/dL — ABNORMAL LOW (ref 8.9–10.3)
Chloride: 106 mmol/L (ref 98–111)
Creatinine, Ser: 0.69 mg/dL (ref 0.61–1.24)
GFR calc Af Amer: >60 mL/min (ref >60)
GFR calc non Af Amer: >60 mL/min (ref >60)
Glucose, Bld: 102 mg/dL — ABNORMAL HIGH (ref 70–99)
Potassium: 3.4 mmol/L — ABNORMAL LOW (ref 3.5–5.1)
Sodium: 142 mmol/L (ref 135–145)

## 2019-12-18 LAB — CBC
HCT: 29.2 % — ABNORMAL LOW (ref 39.0–52.0)
Hemoglobin: 8.9 g/dL — ABNORMAL LOW (ref 13.0–17.0)
MCH: 26.3 pg (ref 26.0–34.0)
MCHC: 30.5 g/dL (ref 30.0–36.0)
MCV: 86.4 fL (ref 80.0–100.0)
Platelets: 393 10*3/uL (ref 150–400)
RBC: 3.38 MIL/uL — ABNORMAL LOW (ref 4.22–5.81)
RDW: 23.1 % — ABNORMAL HIGH (ref 11.5–15.5)
WBC: 11.2 10*3/uL — ABNORMAL HIGH (ref 4.0–10.5)
nRBC: 0 % (ref 0.0–0.2)

## 2019-12-18 LAB — GLUCOSE, CAPILLARY: Glucose-Capillary: 116 mg/dL — ABNORMAL HIGH (ref 70–99)

## 2019-12-18 LAB — MAGNESIUM: Magnesium: 1.6 mg/dL — ABNORMAL LOW (ref 1.7–2.4)

## 2019-12-18 SURGERY — ANTERIOR CERVICAL DECOMPRESSION/DISCECTOMY FUSION 1 LEVEL
Anesthesia: General

## 2019-12-18 MED ORDER — HALOPERIDOL LACTATE 5 MG/ML IJ SOLN: 0.5000 mg | INTRAMUSCULAR | Status: DC | PRN

## 2019-12-18 MED ORDER — MORPHINE SULFATE (CONCENTRATE) 10 MG/0.5ML PO SOLN: 5.0000 mg | ORAL | Status: DC | PRN

## 2019-12-18 MED ORDER — GLYCOPYRROLATE 0.2 MG/ML IJ SOLN
0.2000 mg | INTRAMUSCULAR | Status: DC | PRN
  Filled 2019-12-18: qty 1

## 2019-12-18 MED ORDER — MAGNESIUM SULFATE 2 GM/50ML IV SOLN
2.0000 g | Freq: Once | INTRAVENOUS | Status: AC
  Administered 2019-12-18: 2 g via INTRAVENOUS
  Filled 2019-12-18: qty 50

## 2019-12-18 MED ORDER — HALOPERIDOL LACTATE 2 MG/ML PO CONC
0.5000 mg | ORAL | Status: DC | PRN
  Filled 2019-12-18: qty 0.3

## 2019-12-18 MED ORDER — POTASSIUM CHLORIDE 20 MEQ/15ML (10%) PO SOLN
40.0000 meq | Freq: Once | ORAL | Status: AC
  Administered 2019-12-18: 40 meq via ORAL
  Filled 2019-12-18: qty 30

## 2019-12-18 MED ORDER — PROPOFOL 10 MG/ML IV BOLUS
INTRAVENOUS | Status: AC
  Filled 2019-12-18: qty 20

## 2019-12-18 MED ORDER — HALOPERIDOL 0.5 MG PO TABS
0.5000 mg | ORAL_TABLET | ORAL | Status: DC | PRN
  Filled 2019-12-18: qty 1

## 2019-12-18 MED ORDER — ONDANSETRON HCL 4 MG/2ML IJ SOLN: 4.0000 mg | Freq: Four times a day (QID) | INTRAMUSCULAR | Status: DC | PRN

## 2019-12-18 MED ORDER — FENTANYL CITRATE (PF) 250 MCG/5ML IJ SOLN
INTRAMUSCULAR | Status: AC
  Filled 2019-12-18: qty 5

## 2019-12-18 MED ORDER — POLYVINYL ALCOHOL 1.4 % OP SOLN
1.0000 [drp] | Freq: Four times a day (QID) | OPHTHALMIC | Status: DC | PRN
  Filled 2019-12-18: qty 15

## 2019-12-18 MED ORDER — MORPHINE SULFATE (PF) 2 MG/ML IV SOLN: 1.0000 mg | INTRAVENOUS | Status: DC | PRN

## 2019-12-18 MED ORDER — ONDANSETRON 4 MG PO TBDP: 4.0000 mg | ORAL_TABLET | Freq: Four times a day (QID) | ORAL | Status: DC | PRN

## 2019-12-18 MED ORDER — GLYCOPYRROLATE 1 MG PO TABS
1.0000 mg | ORAL_TABLET | ORAL | Status: DC | PRN
  Filled 2019-12-18: qty 1

## 2019-12-18 MED ORDER — BIOTENE DRY MOUTH MT LIQD: 15.0000 mL | OROMUCOSAL | Status: DC | PRN

## 2019-12-18 NOTE — Progress Notes (Signed)
  NEUROSURGERY PROGRESS NOTE   No issues overnight. Pt able to nod to questions, does report neck pain.  EXAM:  BP 117/73   Pulse (!) 111   Temp 98.6 F (37 C) (Oral)   Resp (!) 28   Ht 5\' 9"  (1.753 m)   Wt 81.8 kg   SpO2 92%   BMI 26.64 kg/m   Awake, alert, hypophonic, unable to vocalize Nods to questions  CN grossly intact  0/5 BUE 0/5 proximal BLE 2/5 DF LLE/RLE  IMAGING: MR Cspine was personally reviewed.  This demonstrates severe stenosis primarily at the C3-4 level.  It appears there is partially calcified disc at this level, with T2 hyperintense signal within the disc suggesting an element of discitis.  There is also significant ligamentous buckling posteriorly at the see 3 4 and C4-5 level which also contributes to relatively severe stenosis primarily at the C3 level.  There does also appear to be significant edema within the spinal cord extending from approximately the bottom of C2-C4.    I did also personally review previous CT scan from 2019, which does Demonstrate the presence of partially calcified disc protrusion at C3-4.  IMPRESSION:  86 y.o. male with multiple medical comorbidities with progressive worsening of now dense quadriparesis over the last several weeks.  Patient has been essentially quadriplegic for at least several days.  Given the length of time of his weakness, I think the chances of meaningful neurologic recovery to the point where he is independent or partially independent is minuscule.  Also given his age and multiple medical comorbidities, the risk of significant perioperative complication or death is actually quite high.  I therefore do not think that surgical treatment of his stenosis is a reasonable option given the minuscule chance of improvement in his functional status and the high risk of complication.  PLAN: - We will plan on consulting palliative care service for possible transition of care to palliative/comfort care approach.  I have  reviewed the situation over the phone with the patient's son.  I did also speak with his wife who indicated that his son would be making decisions on his behalf.  I reviewed with him the imaging findings as the cause for his weakness.  We discussed surgery, and the low likelihood that it would provide any meaningful benefit as well as the high risk of perioperative complication.  In the setting, while I am willing to perform the operation, I told him that it is almost certainly a futile procedure.  He therefore agrees not to proceed with surgical intervention.  We did discuss consulting the palliative care service for transition of care to which he also agrees.  All his questions today were answered.  I did also speak with the primary service and reviewed the plan above.  They plan on consulting palliative care as discussed with the family.

## 2019-12-18 NOTE — Progress Notes (Signed)
Palliative:  HPI: 86 y.o.malewith past medical history of CAD, PAD, HTN, CKD III, right renal carcinoma s/p right nephrectomy, recent admission in December for sepsis secondary to cholecystitis s/p percutaneous drainage and scheduled for cholecystectomy next monthwho was admitted on 3/4/2021with progressive weakness, anorexia and lethargy.He was found to be bacteremic with enterococcus secondary to urinary source.Hospitalization complicated by ongoing issues with lethargy, atrial fibrillation, pulmonary edema, dysphagia. 3/11 found to have severe stenosis C3-C4 with plans for surgical intervention 3/12.   I met this morning at Derrick [name] bedside. No family present. Derrick [name] is much more lethargic today. He is unable to speak. Barely has energy to nod head to my questions but does attempt to answer. He attempts to speak but no sound and I was unable to understand him and then he gave up. I reassured him and encouraged him to get some rest and we will take good care of him.   I received call from Dr. [name] that neurosurgery has spoken with family and surgery has been cancelled as they fear Derrick [name] will benefit from surgery and high risk for further decline.   I called and spoke with Derrick [name]. Derrick [name] has good understanding of his father's condition and although they have been very hopeful that he will improve they now understand that he will not improve. We discussed a plan to focus on comfort and quality of life that would include keeping Derrick [name] clean, dry, turned, mouth care, and surrounded by his family. Derrick [name] agrees that he wishes for comfort and does not want his father to suffer. He wants for family to be able to spend time with him for the time that he has left. DNR was decided. We also discussed that the best place this care can be provided would be at a hospice facility. They are interested in pursuing transition to [facility].   We discussed that Derrick [name] would not receive IV antibiotics at  hospice and that he is struggling to take pills. Derrick [name] understands that prognosis is poor regardless of antibiotics and desires comfort. We also discussed that we would not want to prolong his suffering knowing his condition is not going to improve.   All questions/concerns addressed. Emotional support provided. Discussed with Dr. [name] and RN Thurmond Butts.   Exam: Lethargic - more than yesterday. Pale. Delayed response. Non verbal. No movement of extremities. Breathing regular, unlabored. Abd soft. Severe weakness.   Plan: - Comfort care. Plans for transition to [facility].  - Medications in place as needed for symptom management.  - Medications minimized due to risk of aspiration and lesson pill burden.   [place], NP Palliative Medicine Team Pager [phone] (Please see [email or web address] for schedule) Team Phone [phone]    Greater than 50%  of this time was spent counseling and coordinating care related to the above assessment and plan

## 2019-12-18 NOTE — TOC Initial Note (Signed)
Transition of Care [facility]) - Initial/Assessment Note    Patient Details  Name: Derrick [name] MRN: [id number] Date of Birth: [birth year]  [facility] CM/SW Contact:    Trula Ore, [place] Phone Number: 12/18/2019, 3:12 PM  Clinical Narrative:           CSW notes recs. have changed to comfort care. CSW consulted for residential hospice [facility] place referral. CSW spoke with Trey Sailors with [facility] she will initiate referral and speak with family. She reports no beds today but potential availability tomorrow.           Expected Discharge Plan: Hospice Medical Facility Barriers to Discharge: Hospice Bed not available   Patient Goals and CMS Choice   CMS Medicare.gov Compare Post Acute Care list provided to:: Patient Represenative (must comment)(Son [name]) Choice offered to / list presented to : Adult Children  Expected Discharge Plan and Services Expected Discharge Plan: [place]     Post Acute Care Choice: Hospice Living arrangements for the past 2 months: Single Family Home                                      Prior Living Arrangements/Services Living arrangements for the past 2 months: Single Family Home Lives with:: Spouse Patient language and need for interpreter reviewed:: Yes Do you feel safe going back to the place where you live?: Yes      Need for Family Participation in Patient Care: Yes (Comment) Care giver support system in place?: Yes (comment)   Criminal Activity/Legal Involvement Pertinent to Current Situation/Hospitalization: No - Comment as needed  Activities of Daily Living   ADL Screening (condition at time of admission) Patient's cognitive ability adequate to safely complete daily activities?: No Is the patient deaf or have difficulty hearing?: No Does the patient have difficulty concentrating, remembering, or making decisions?: Yes Does the patient have difficulty dressing or bathing?: Yes Does the patient  have difficulty walking or climbing stairs?: Yes Weakness of Legs: Both Weakness of Arms/Hands: Both  Permission Sought/Granted Permission sought to share information with : Case Manager, Customer service manager, Family Supports Permission granted to share information with : Yes, Verbal Permission Granted  Share Information with NAME: [name]  Permission granted to share info w AGENCY: Hospice/[facility]  Permission granted to share info w Relationship: Son  Permission granted to share info w Contact Information: [phone]  Emotional Assessment Appearance:: Appears stated age     Orientation: : Oriented to Self Alcohol / Substance Use: Not Applicable Psych Involvement: No (comment)  Admission diagnosis:  AKI (acute kidney injury) ([place]) [N17.9] Atrial fibrillation with RVR ([place]) [I48.91] Sepsis ([place]) [A41.9] Sepsis, due to unspecified organism, unspecified whether acute organ dysfunction present [facility]) [A41.9] Patient Active Problem List   Diagnosis Date Noted  . Enterococcal bacteremia 12/11/2019  . Cholecystitis, S/P drain 09-2019 12/11/2019  . Goals of care, counseling/discussion   . Palliative care by specialist   . Hydronephrosis of left kidney 12/10/2019  . AMS (altered mental status) 12/10/2019  . Depression 12/10/2019  . Atrial fibrillation with RVR ([place]) 09/19/2019  . Acute cholecystitis 09/18/2019  . E coli bacteremia 09/17/2019  . Sepsis ([place]) 09/16/2019  . Transaminitis 09/16/2019  . Nausea and vomiting 09/16/2019  . RA (rheumatoid arthritis) ([place]) 09/16/2019  . Arm pain 07/07/2019  . A-fib ([place]) 12/12/2018  . Acute blood loss anemia 11/28/2018  . Tachycardia 11/27/2018  .  Gross hematuria 11/27/2018  . Acute metabolic encephalopathy [id number]  . Spinal stenosis, lumbar region with neurogenic claudication 11/26/2018  . Ulcer of right foot limited to breakdown of skin ([place])   . PAD (peripheral artery disease) ([place]) 09/23/2017  . Syncope and  collapse 09/19/2017  . Osteomyelitis ([place])   . Malaise 09/09/2017  . Anorexia 09/09/2017  . Hypotension 09/09/2017  . Volume depletion 09/09/2017  . AKI (acute kidney injury) ([place]) 09/09/2017  . Hyponatremia 06/12/2017  . COPD (chronic obstructive pulmonary disease) ([place])   . Lacunar infarction ([place])   . Gangrene of toe of right foot ([place])   . Pre-syncope 11/21/2016  . Dyspnea on exertion 03/03/2016  . H/O unilateral Rt nephrectomy 03/03/2016  . Asymmetrical right sensorineural hearing loss 02/24/2016  . Bilateral hearing loss 02/24/2016  . Bilateral impacted cerumen 02/24/2016  . Arrhythmia 11/09/2015  . CAD (coronary artery disease) 11/09/2015  . Abnormal ECG 10/22/2013  . Dyspnea 10/22/2013  . Chest pain 11/18/2011  . Unstable angina ([place]) 11/18/2011  . Thrombocytopenia ([place]) 11/18/2011  . Anemia 11/18/2011  . CKD (chronic kidney disease) 11/18/2011  . HTN (hypertension)   . High cholesterol   . GERD (gastroesophageal reflux disease)   . BPH (benign prostatic hyperplasia)    PCP:  [name], MD Pharmacy:   [place], [place] - [address] [place] [place] [address] Phone: [phone] Fax: [phone]     Social Determinants of Health (SDOH) Interventions    Readmission Risk Interventions Readmission Risk Prevention Plan 09/22/2019  Transportation Screening Complete  Medication Review Press photographer) Complete  PCP or Specialist appointment within 3-5 days of discharge Complete  HRI or Home Care Consult Complete  SW Recovery Care/Counseling Consult Complete  Palliative Care Screening Not [place] Complete  Some recent data might be hidden

## 2019-12-18 NOTE — Progress Notes (Signed)
  PROGRESS NOTE  Discussed with Dr. Kathyrn Sheriff as well as Vinie Sill, NP this morning. Patient now transitioning to comfort care. Residential hospice appropriate. I called and spoke with son over the phone to answer any questions and concerns as well.    Dessa Phi, DO [facility] 12/18/2019, 12:49 PM  Available via Epic secure chat 7am-7pm After these hours, please refer to coverage provider listed on [email or web address]

## 2019-12-18 NOTE — TOC Progression Note (Addendum)
Transition of Care [facility]) - Progression Note    Patient Details  Name: BRAZEN MARSILI MRN: [id number] Date of Birth: 1931/12/03  Transition of Care [facility]) CM/SW [place], [place] Phone Number: 12/18/2019, 9:42 AM  Clinical Narrative:     CSW called patients son to provide list of accepted SNFs. Son is going to speak with his mother and call CSW back with choice. CSW called [facility] for insurance authorization. Faxed out facesheet, FL2, H&P, PT, OT, and progress notes to [facility].  Authorization is pending as well as SNF choice. Lexicographer number is [id number].          Expected Discharge Plan and Services                                                 Social Determinants of Health (SDOH) Interventions    Readmission Risk Interventions Readmission Risk Prevention Plan 09/22/2019  Transportation Screening Complete  Medication Review ([place]) Complete  PCP or Specialist appointment within 3-5 days of discharge Complete  HRI or Home Care Consult Complete  SW Recovery Care/Counseling Consult Complete  Palliative Care Screening Not [place] Complete  Some recent data might be hidden

## 2019-12-18 NOTE — Progress Notes (Signed)
  Speech Language Pathology Treatment:   Dysphagia therapy Patient Details Name: Derrick [name] MRN: [id number] DOB: [birth year] Today's Date: 12/18/2019 Time: [id number] SLP Time Calculation (min) (ACUTE ONLY): 17 min  Assessment / Plan / Recommendation Clinical Impression  Dysphagia therapy provided at bedside. Provided total assist for po trials with nectar thickened liquids by tsp. First tsp of nectar juice, pt swallowed 6-7 times quickly. When asked if he was okay, he mouth he was choking. Subsequently asked to cough to clear. Cough was very weak and breathy. Pt voice was barely audible, but did not hear wetness in his vocal quality. Subsequent tsp size trials of nectar liquid were tolerated with 2 swallows per tsp. He shock his head agreeing to take more juice. He refused trials of puree. Due to pt's continued weakness and poor cough quality, would recommend holding instrumental testing to assess for a diet upgrade until next week.    HPI HPI: Pt is an 86 y.o. male with medical history significant of rheumatoid arthritis on prednisone, Plaquenil, coronary artery disease, PAD, depression, hypertension, CKD stage III, right renal carcinoma status post right nephrectomy, cholecystitis status post cholecystectomy and tube placement by IR who presented to the ED with generalized weakness, lethargic, loss of appetite and abdominal pain since 2 weeks. CT of the head negative for acute changes. Extensive bilateral corona radiata white matter disease. Chest x-ray 3/4: no acute disease.      SLP Plan  New goals to be determined pending instrumental study       Recommendations  Diet recommendations: Dysphagia 1 (puree);Nectar-thick liquid Liquids provided via: Cup Medication Administration: Crushed with puree Supervision: Full supervision/cueing for compensatory strategies Compensations: Minimize environmental distractions;Small sips/bites Postural Changes and/or Swallow Maneuvers: Seated upright 90  degrees;Upright 30-60 min after meal                Plan: New goals to be determined pending instrumental study       GO               Wynelle Bourgeois., MA, CCC-SLP 12/18/2019, 10:57 AM

## 2019-12-18 NOTE — Plan of Care (Signed)
?  Problem: Clinical Measurements: ?Goal: Ability to maintain clinical measurements within normal limits will improve ?Outcome: Progressing ?Goal: Will remain free from infection ?Outcome: Progressing ?Goal: Diagnostic test results will improve ?Outcome: Progressing ?  ?

## 2019-12-18 NOTE — Progress Notes (Signed)
PROGRESS NOTE    Derrick [name]  [id number] DOB: 1931/10/18 DOA: 12/10/2019 PCP: [name], MD     Brief Narrative:  Derrick [name] is an 86 year old man admitted from home on 3/4 with complaints of generalized weakness, lethargy, anorexia and abdominal pain for the past 2 weeks.  Per son, patient has had decreased urinary output but no fevers or chills.  His past medical history significant for a recent admission in December for sepsis secondary to acute cholecystitis status post percutaneous drainage by interventional radiology, hypertension, stage III chronic kidney disease, right renal cell carcinoma status post right nephrectomy.  CT scan in the emergency department was concerning for left hydroureteronephrosis and signs of an upper UTI.  Admission was requested for further evaluation and management.    Patient was found to have Enterococcus bacteremia from urinary source.  Infectious disease and urology were consulted.  Patient underwent echocardiogram which did not show any vegetation.  He was recommended to continue IV ampicillin through April 3.  Palliative care was also consulted given patient's advanced age and multiple medical comorbidities.  He developed some respiratory failure and had chest x-ray which revealed patchy bilateral infiltrates.  Patient was started on Lasix with improvement in his respiratory status.  His altered mental status improved, was found to have bilateral upper extremity plegia.  MRI cervical spine revealed severe cervical stenosis at C3-C4.  Neurosurgery was consulted.  New events last 24 hours / Subjective: Patient is alert although remains confused today.  He is able to follow some simple commands but does not answer questions appropriately.  He is unaware that he is undergoing surgery later today.  Assessment & Plan:   Principal Problem:   Enterococcal bacteremia Active Problems:   HTN (hypertension)   Anemia   CKD (chronic kidney disease)   Sepsis  (HCC)   RA (rheumatoid arthritis) (HCC)   Atrial fibrillation with RVR (HCC)   Hydronephrosis of left kidney   AMS (altered mental status)   Depression   Cholecystitis, S/P drain 09-2019   Goals of care, counseling/discussion   Palliative care by specialist    Sepsis secondary to Enterococcus bacteremia -Thought urinary source as urine cx also positive -Appreciate ID -Urology is recommending repeat renal US 3/7: No residual hydroureteronephrosis, clot in the bladder lumen with distended bladder despite foley catheter. He has a h/o prostate cancer.  Follow-up with Dr. Jeffie Pollock as an outpatient -TTE without vegetations. -Repeat blood culture drawn on 3/6 remain negative. If they turn positive may need to consider TEE to r/o endocarditis. -Plan to continue IV ampicillin, per ID plan to continue until 4/3  Acute pulmonary edema -CXR 3/9 showed patchy bilateral infiltrates. Started on lasix -Repeat CXR 3/11 reviewed independently, bilateral interstitial edema -Continue Lasix  Acute hypoxemic respiratory failure -Secondary to above -Improved, on room air this morning  Severe cervical stenosis C3-C4 -Neurosurgery consulted, planning for decompression and fusion 3/12 -Patient had a cardiac evaluation in February 2021 for preop clearance pending cholecystectomy in April 2021.  At that time, he underwent stress test which was negative -RCRI class II risk indicating 6% 30-day risk of death, MI or cardiac arrest  Acute metabolic encephalopathy -Likely due to sepsis -CT head is negative for CVA on admission 3/4 -Repeat CT head negative 3/9 -Improved since admission but remains confused  Atrial fibrillation with RVR -In the setting of sepsis -He is not on anticoagulation at home despite high CHADSVASC score, suspect on account of age, frailty and deconditioning -Continue p.o. Cardizem  Recent history of cholecystitis with percutaneous drainage -Appreciate general surgery consultation.   Scheduled for outpatient cholecystectomy 4/5  Rheumatoid arthritis -At home on prednisone and Plaquenil - currently on hold during sepsis treatment   History of right renal cell carcinoma -Status post nephrectomy  Hypokalemia -Replace, trend  Hypomagnesemia -Replace, trend     DVT prophylaxis: Lovenox Code Status: Full Family Communication: None at bedside Disposition Plan:  . Patient is from home prior to admission. . Currently in-hospital treatment needed due to plan for cervical surgical intervention 3/12 . Suspect patient will discharge to possibly SNF in 2 to 3 days   Consultants:   ID  Urology  Palliative care   Neurosurgery   Antimicrobials:  Anti-infectives (From admission, onward)   Start     Dose/Rate Route Frequency Ordered Stop   12/18/19 0600  ceFAZolin (ANCEF) IVPB 2g/100 mL premix     2 g 200 mL/hr over 30 Minutes Intravenous On call to O.R. 12/17/19 1943 12/19/19 0559   12/11/19 1200  cefTRIAXone (ROCEPHIN) 1 g in sodium chloride 0.9 % 100 mL IVPB  Status:  Discontinued     1 g 200 mL/hr over 30 Minutes Intravenous  Once 12/10/19 1831 12/11/19 0143   12/11/19 0800  ampicillin (OMNIPEN) 2 g in sodium chloride 0.9 % 100 mL IVPB     2 g 300 mL/hr over 20 Minutes Intravenous Every 4 hours 12/11/19 0143 01/09/20 2359   12/11/19 0200  ampicillin (OMNIPEN) 2 g in sodium chloride 0.9 % 100 mL IVPB     2 g 300 mL/hr over 20 Minutes Intravenous NOW 12/11/19 0143 12/11/19 0401   12/10/19 1145  cefTRIAXone (ROCEPHIN) 2 g in sodium chloride 0.9 % 100 mL IVPB     2 g 200 mL/hr over 30 Minutes Intravenous  Once 12/10/19 1138 12/10/19 1240   12/10/19 1145  metroNIDAZOLE (FLAGYL) IVPB 500 mg     500 mg 100 mL/hr over 60 Minutes Intravenous  Once 12/10/19 1139 12/10/19 1254       Objective: Vitals:   12/18/19 0500 12/18/19 0800 12/18/19 0802 12/18/19 0900  BP:  (!) 125/58  117/73  Pulse: (!) 111     Resp:  20  (!) 28  Temp: 98.3 F (36.8 C)  98.2  F (36.8 C)   TempSrc: Oral  Oral   SpO2: 92%     Weight:      Height:        Intake/Output Summary (Last 24 hours) at 12/18/2019 0938 Last data filed at 12/18/2019 0500 Gross per 24 hour  Intake 1580 ml  Output 2825 ml  Net -1245 ml   Filed Weights   12/16/19 0349 12/17/19 0549 12/18/19 0000  Weight: 83.7 kg 82.6 kg 81.8 kg     Examination: General exam: Appears calm and comfortable  Respiratory system: Clear to auscultation. Respiratory effort normal.  On room air this morning Cardiovascular system: S1 & S2 heard, irregular rhythm, rate 90-100. No pedal edema. Gastrointestinal system: Abdomen is nondistended, soft and nontender. Normal bowel sounds heard. Central nervous system: Alert but not oriented.  Bilateral upper extremity weakness/plegia Extremities: Symmetric in appearance bilaterally  Skin: No rashes, lesions or ulcers on exposed skin      Data Reviewed: I have personally reviewed following labs and imaging studies  CBC: Recent Labs  Lab 12/15/19 0405 12/16/19 0352 12/17/19 0444 12/17/19 2100 12/18/19 0449  WBC 15.6* 15.2* 12.4* 13.3* 11.2*  NEUTROABS  --   --   --  11.1*  --  HGB 10.0* 9.1* 8.7* 9.0* 8.9*  HCT 32.7* 30.0* 28.8* 29.4* 29.2*  MCV 85.4 86.0 86.7 86.5 86.4  PLT 379 385 342 400 [id number]   Basic Metabolic Panel: Recent Labs  Lab 12/15/19 0405 12/16/19 0352 12/16/19 1626 12/17/19 0444 12/17/19 2100 12/18/19 0449  NA 144 140  --  141 140 142  K 3.3* 3.0*  --  3.2* 3.7 3.4*  CL 117* 109  --  110 109 106  CO2 17* 22  --  24 22 24   GLUCOSE 160* 132*  --  122* 108* 102*  BUN 15 11  --  14 14 13   CREATININE 0.68 0.72  --  0.79 0.77 0.69  CALCIUM 8.1* 8.1*  --  8.0* 7.7* 8.0*  MG  --   --  1.4* 1.9  --  1.6*  PHOS  --   --  1.5*  --   --   --    GFR: Estimated Creatinine Clearance: 67.5 mL/min (by C-G formula based on SCr of 0.69 mg/dL). Liver Function Tests: Recent Labs  Lab 12/12/19 0514 12/14/19 0421 12/16/19 0352 12/17/19 2100   AST 32 43* 37 84*  ALT 40 48* 54* 79*  ALKPHOS 101 105 98 117  BILITOT 0.6 0.7 0.3 0.5  PROT 5.3* 5.4* 5.2* 5.0*  ALBUMIN 1.8* 1.6* 1.4* 1.4*   No results for input(s): LIPASE, AMYLASE in the last 168 hours. No results for input(s): AMMONIA in the last 168 hours. Coagulation Profile: No results for input(s): INR, PROTIME in the last 168 hours. Cardiac Enzymes: No results for input(s): CKTOTAL, CKMB, CKMBINDEX, TROPONINI in the last 168 hours. BNP (last 3 results) No results for input(s): PROBNP in the last 8760 hours. HbA1C: No results for input(s): HGBA1C in the last 72 hours. CBG: Recent Labs  Lab 12/14/19 0623 12/15/19 0449 12/16/19 0620 12/17/19 0607 12/18/19 0610  GLUCAP 99 139* 107* 117* 116*   Lipid Profile: No results for input(s): CHOL, HDL, LDLCALC, TRIG, CHOLHDL, LDLDIRECT in the last 72 hours. Thyroid Function Tests: No results for input(s): TSH, T4TOTAL, FREET4, T3FREE, THYROIDAB in the last 72 hours. Anemia Panel: No results for input(s): VITAMINB12, FOLATE, FERRITIN, TIBC, IRON, RETICCTPCT in the last 72 hours. Sepsis Labs: No results for input(s): PROCALCITON, LATICACIDVEN in the last 168 hours.  Recent Results (from the past 240 hour(s))  Blood Culture (routine x 2)     Status: Abnormal   Collection Time: 12/10/19 11:30 AM   Specimen: BLOOD  Result Value Ref Range Status   Specimen Description BLOOD RIGHT ANTECUBITAL  Final   Special Requests   Final    BOTTLES DRAWN AEROBIC AND ANAEROBIC Blood Culture adequate volume   Culture  Setup Time   Final    IN BOTH AEROBIC AND ANAEROBIC BOTTLES CRITICAL RESULT CALLED TO, READ BACK BY AND VERIFIED WITH: K AMEND PHARMD 12/11/19 0125 JDW GRAM POSITIVE COCCI Performed at [facility], [address], [place], [place] [address]    Culture ENTEROCOCCUS FAECALIS (A)  Final   Report Status 12/13/2019 FINAL  Final   Organism ID, Bacteria ENTEROCOCCUS FAECALIS  Final      Susceptibility   Enterococcus  faecalis - MIC*    AMPICILLIN <=2 SENSITIVE Sensitive     VANCOMYCIN 1 SENSITIVE Sensitive     GENTAMICIN SYNERGY SENSITIVE Sensitive     * ENTEROCOCCUS FAECALIS  Blood Culture ID Panel (Reflexed)     Status: Abnormal   Collection Time: 12/10/19 11:30 AM  Result Value Ref  Range Status   Enterococcus species DETECTED (A) NOT DETECTED Final    Comment: CRITICAL RESULT CALLED TO, READ BACK BY AND VERIFIED WITH: K AMEND PHARMD 12/11/19 0125 JDW    Vancomycin resistance NOT DETECTED NOT DETECTED Final   Listeria monocytogenes NOT DETECTED NOT DETECTED Final   Staphylococcus species NOT DETECTED NOT DETECTED Final   Staphylococcus aureus (BCID) NOT DETECTED NOT DETECTED Final   Streptococcus species NOT DETECTED NOT DETECTED Final   Streptococcus agalactiae NOT DETECTED NOT DETECTED Final   Streptococcus pneumoniae NOT DETECTED NOT DETECTED Final   Streptococcus pyogenes NOT DETECTED NOT DETECTED Final   Acinetobacter baumannii NOT DETECTED NOT DETECTED Final   Enterobacteriaceae species NOT DETECTED NOT DETECTED Final   Enterobacter cloacae complex NOT DETECTED NOT DETECTED Final   Escherichia coli NOT DETECTED NOT DETECTED Final   Klebsiella oxytoca NOT DETECTED NOT DETECTED Final   Klebsiella pneumoniae NOT DETECTED NOT DETECTED Final   Proteus species NOT DETECTED NOT DETECTED Final   Serratia marcescens NOT DETECTED NOT DETECTED Final   Haemophilus influenzae NOT DETECTED NOT DETECTED Final   Neisseria meningitidis NOT DETECTED NOT DETECTED Final   Pseudomonas aeruginosa NOT DETECTED NOT DETECTED Final   Candida albicans NOT DETECTED NOT DETECTED Final   Candida glabrata NOT DETECTED NOT DETECTED Final   Candida krusei NOT DETECTED NOT DETECTED Final   Candida parapsilosis NOT DETECTED NOT DETECTED Final   Candida tropicalis NOT DETECTED NOT DETECTED Final    Comment: Performed at [facility], [address], [place], [place] [address]  Blood Culture (routine x 2)     Status:  Abnormal   Collection Time: 12/10/19 11:37 AM   Specimen: BLOOD LEFT HAND  Result Value Ref Range Status   Specimen Description BLOOD LEFT HAND  Final   Special Requests   Final    BOTTLES DRAWN AEROBIC AND ANAEROBIC Blood Culture adequate volume   Culture  Setup Time   Final    IN BOTH AEROBIC AND ANAEROBIC BOTTLES GRAM POSITIVE COCCI IN CHAINS CRITICAL VALUE NOTED.  VALUE IS CONSISTENT WITH PREVIOUSLY REPORTED AND CALLED VALUE.    Culture (A)  Final    ENTEROCOCCUS FAECALIS SUSCEPTIBILITIES PERFORMED ON PREVIOUS CULTURE WITHIN THE LAST 5 DAYS. Performed at [facility], [place] [address], [place], [place] [address]    Report Status 12/13/2019 FINAL  Final  Urine culture     Status: Abnormal   Collection Time: 12/10/19  1:50 PM   Specimen: In/Out Cath Urine  Result Value Ref Range Status   Specimen Description IN/OUT CATH URINE  Final   Special Requests   Final    NONE Performed at [facility], [place] [address] [place], [place] [address]    Culture >=100,000 COLONIES/mL ENTEROCOCCUS FAECALIS (A)  Final   Report Status 12/12/2019 FINAL  Final   Organism ID, Bacteria ENTEROCOCCUS FAECALIS (A)  Final      Susceptibility   Enterococcus faecalis - MIC*    AMPICILLIN <=2 SENSITIVE Sensitive     NITROFURANTOIN <=16 SENSITIVE Sensitive     VANCOMYCIN 1 SENSITIVE Sensitive     * >=100,000 COLONIES/mL ENTEROCOCCUS FAECALIS  SARS CORONAVIRUS 2 (TAT 6-24 HRS) Nasopharyngeal Nasopharyngeal Swab     Status: None   Collection Time: 12/10/19  7:02 PM   Specimen: Nasopharyngeal Swab  Result Value Ref Range Status   SARS Coronavirus 2 NEGATIVE NEGATIVE Final    Comment: (NOTE) SARS-CoV-2 target nucleic acids are NOT DETECTED. The SARS-CoV-2 RNA  is generally detectable in upper and lower respiratory specimens during the acute phase of infection. Negative results do not preclude SARS-CoV-2 infection, do not rule out co-infections with other pathogens, and should not be used as  the sole basis for treatment or other patient management decisions. Negative results must be combined with clinical observations, patient history, and epidemiological information. The expected result is Negative. Fact Sheet for Patients: SugarRoll.be Fact Sheet for Healthcare Providers: https://www.woods-mathews.com/ This test is not yet approved or cleared by the [place] FDA and  has been authorized for detection and/or diagnosis of SARS-CoV-2 by FDA under an Emergency Use Authorization (EUA). This EUA will remain  in effect (meaning this test can be used) for the duration of the COVID-19 declaration under Section 56 4(b)(1) of the Act, 21 U.S.C. section 360bbb-3(b)(1), unless the authorization is terminated or revoked sooner. Performed at [facility], [place] [address] [place], [place] [address]   Culture, blood (routine x 2)     Status: None   Collection Time: 12/12/19  5:16 AM   Specimen: BLOOD RIGHT HAND  Result Value Ref Range Status   Specimen Description BLOOD RIGHT HAND  Final   Special Requests   Final    BOTTLES DRAWN AEROBIC AND ANAEROBIC Blood Culture adequate volume   Culture   Final    NO GROWTH 5 DAYS Performed at [facility], [place] [address] [place], [place] [address]    Report Status 12/17/2019 FINAL  Final  Culture, blood (routine x 2)     Status: None   Collection Time: 12/12/19  5:20 AM   Specimen: BLOOD LEFT HAND  Result Value Ref Range Status   Specimen Description BLOOD LEFT HAND  Final   Special Requests   Final    BOTTLES DRAWN AEROBIC AND ANAEROBIC Blood Culture adequate volume   Culture   Final    NO GROWTH 5 DAYS Performed at [facility], [place] [address] [place], [place] [address]    Report Status 12/17/2019 FINAL  Final      Radiology Studies: MR BRAIN WO CONTRAST  Result Date: 12/17/2019 CLINICAL DATA:  Generalized weakness EXAM: MRI HEAD WITHOUT CONTRAST TECHNIQUE:  Multiplanar, multiecho pulse sequences of the brain and surrounding structures were obtained without intravenous contrast. COMPARISON:  2018 FINDINGS: Brain: There is no acute infarction or hemorrhage. Confluent areas of T2 hyperintensity in the supratentorial white matter are nonspecific but likely reflect advanced chronic microvascular ischemic changes. There is a chronic left thalamic infarct with chronic blood products. Prominence of ventricles and sulci reflects moderate to marked parenchymal volume loss. Appearance is similar. There is no intracranial mass, mass effect, or edema. Vascular: Major vessel flow voids at the skull base are preserved. Skull and upper cervical spine: Normal marrow signal is preserved. Sinuses/Orbits: Minor mucosal thickening.  Orbits are unremarkable. Other: Sella is unremarkable.  Mastoid air cells are clear. IMPRESSION: No acute infarction, hemorrhage, or mass. Stable chronic findings detailed above. Electronically Signed   By: Macy Mis M.D.   On: 12/17/2019 16:56   MR CERVICAL SPINE WO CONTRAST  Result Date: 12/17/2019 CLINICAL DATA:  Neck pain, upper extremity weakness EXAM: MRI CERVICAL SPINE WITHOUT CONTRAST TECHNIQUE: Multiplanar, multisequence MR imaging of the cervical spine was performed. No intravenous contrast was administered. COMPARISON:  CT 04/29/2018 FINDINGS: Technical note: Examination is significantly degraded by motion artifact. Alignment: 3 mm anterolisthesis C4 on C5. Trace retrolisthesis C5 on C6 and C6 on C7. Straightening and slight reversal  of the cervical lordosis. Vertebrae: There is fluid signal within the C3-4 disc. Mild associated endplate marrow edema without definite bony destruction. There is prevertebral soft tissue edema along the anterior aspect of the cervical spine most prevalent from C2-C4 (series 5, image 11). It is difficult to assess if there is any epidural fluid given the degree of motion artifact at this level. No fracture  identified. Cord: Although there is extensive motion degradation, there appears to be increased signal within the cord at the C3 and C4 levels. Posterior Fossa, vertebral arteries, paraspinal tissues: Cerebral and cerebellar atrophy. Vertebral artery flow voids are present. Disc levels: C2-C3: Disc osteophyte complex and bilateral facet arthropathy resulting in moderate canal stenosis and mild bilateral foraminal stenosis, right worse than left. C3-C4: Disc osteophyte complex with bilateral facet and uncovertebral arthropathy resulting in severe canal stenosis with deformation of the cord. Mild-to-moderate right greater than left foraminal stenosis. C4-C5: Posterior disc osteophyte complex with bilateral facet and uncovertebral arthropathy resulting in mild-to-moderate canal stenosis and at least moderate bilateral foraminal stenosis. C5-C6: Posterior disc osteophyte complex with right worse than left facet and uncovertebral arthropathy resulting in mild canal stenosis and moderate bilateral foraminal stenosis. C6-C7: Posterior disc osteophyte complex and bilateral facet and uncovertebral arthropathy resulting in mild canal stenosis and left worse than right foraminal stenosis, likely at least moderate. C7-T1: Small posterior disc osteophyte complex without evidence of foraminal or canal stenosis. IMPRESSION: 1. Limited motion degradation examination. 2. Fluid signal within the C3-4 disc and prevertebral soft tissue edema along the anterior aspect of the cervical spine. Findings are concerning for discitis. No definite epidural fluid collection, although motion artifact significantly degrades evaluation of this level. A repeat pre and post-contrast MRI could be performed for further evaluation, as clinically indicated. 3. Severe C3-4 canal stenosis with cord compression. Increased signal within the cord at this level may represent cord edema or myelomalacia. 4. Severe cervical spondylosis with multilevel mild and  moderate canal stenosis, as detailed above. These results will be called to the ordering clinician or representative by the Radiologist Assistant, and communication documented in the PACS or [employer]. Electronically Signed   By: Davina Poke D.O.   On: 12/17/2019 16:06   DG CHEST PORT 1 VIEW  Result Date: 12/17/2019 CLINICAL DATA:  Patient with respiratory failure. EXAM: PORTABLE CHEST 1 VIEW COMPARISON:  Chest radiograph 12/15/2019. FINDINGS: Right upper extremity PICC line is present with tip projecting over the superior vena cava. Monitoring leads overlie the patient. Stable enlarged cardiac and mediastinal contours. Small layering bilateral pleural effusions. Similar-appearing diffuse bilateral airspace opacities. No pneumothorax. IMPRESSION: Similar-appearing diffuse bilateral airspace opacities and small bilateral pleural effusions. Electronically Signed   By: Lovey Newcomer M.D.   On: 12/17/2019 09:41      Scheduled Meds: . acetaminophen  1,000 mg Oral On Call to OR  . celecoxib  200 mg Oral On Call to OR  . chlorhexidine  15 mL Mouth Rinse BID  . Chlorhexidine Gluconate Cloth  6 each Topical Daily  . diltiazem  90 mg Oral Q6H  . feeding supplement (ENSURE ENLIVE)  237 mL Oral TID BM  . fentaNYL (SUBLIMAZE) injection  12.5 mcg Intravenous Once  . furosemide  40 mg Intravenous Q12H  . gabapentin  300 mg Oral On Call to OR  . mouth rinse  15 mL Mouth Rinse q12n4p  . multivitamin with minerals  1 tablet Oral Daily  . sodium chloride flush  10-40 mL Intracatheter Q12H  . sodium  chloride flush  10-40 mL Intracatheter Q12H   Continuous Infusions: . sodium chloride 250 mL (12/17/19 0418)  . ampicillin (OMNIPEN) IV 2 g (12/18/19 0907)  .  ceFAZolin (ANCEF) IV       LOS: 8 days      Time spent: 35 minutes   Dessa Phi, DO [facility] 12/18/2019, 9:38 AM   Available via Epic secure chat 7am-7pm After these hours, please refer to coverage provider listed on  [email or web address]

## 2019-12-19 LAB — GLUCOSE, CAPILLARY: Glucose-Capillary: 95 mg/dL (ref 70–99)

## 2019-12-19 NOTE — Progress Notes (Addendum)
Manufacturing engineer [facility])  [facility] has a bed for Mr. Piercey today.  Spoke with son [name] to discuss necessary consents and answer questions.  [name] was not available this morning to complete but will be able to later this afternoon.  Will update TOC manager once consents are complete so transport can be arranged.  RN staff, you may call report at any time to [phone], bed is assigned at this time.  Please leave foley in, remove midline and maintain PICC.  Please fax d/c summary to [phone].  Venia Carbon RN, BSN, [facility] Liaison   **[phone], consents completed, [facility] is ready for Mr. Gerber whenever transportation can be arranged.

## 2019-12-19 NOTE — TOC Transition Note (Signed)
Transition of Care [facility]) - CM/SW Discharge Note   Patient Details  Name: Derrick [name] MRN: [id number] Date of Birth: [birth year]  Transition of [facility]) CM/SW Contact:  Bary Castilla, LCSW Phone Number: [phone] 12/19/2019, 1:16 PM   Clinical Narrative:    Patient will DC to:?[facility] Anticipated DC date:12/19/19 Family notified:?Frank Transport by: Corey Harold   Per MD patient ready for DC to [facility].. RN, patient, patient's family, and facility notified of DC. Discharge Summary sent to facility. RN given number for report  [phone]. DC packet on chart. Ambulance transport requested for patient.   CSW signing off.   [place], [place] [phone]    Final next level of care: Hospice Medical Facility Barriers to Discharge: No Barriers Identified   Patient Goals and CMS Choice   CMS Medicare.gov Compare Post Acute Care list provided to:: Patient Represenative (must comment)(Son [name]) Choice offered to / list presented to : Adult Children  Discharge Placement              Patient [facility] bed at: [facility]) Patient to be transferred to facility by: Marie Name of family member notified: [name] Patient and family notified of of transfer: 12/19/19  Discharge Plan and Services     Post Acute Care Choice: Hospice                               Social Determinants of Health (SDOH) Interventions     Readmission Risk Interventions Readmission Risk Prevention Plan 09/22/2019  Transportation Screening Complete  Medication Review ([place]) Complete  PCP or Specialist appointment within 3-5 days of discharge Complete  HRI or Home Care Consult Complete  SW Recovery Care/Counseling Consult Complete  Palliative Care Screening Not [place] Complete  Some recent data might be hidden

## 2019-12-19 NOTE — Progress Notes (Signed)
Manufacturing engineer [facility])  Referral received for [employer] for end of life.   [facility] does not have a bed available today.    Will update TOC team and family once bed status changes.  Venia Carbon RN, BSN, [facility] Liaison (in [email or web address])

## 2019-12-19 NOTE — Discharge Summary (Signed)
Physician Discharge Summary  Derrick [name] [id number] DOB: June 10, 1932 DOA: 12/10/2019  PCP: [name], MD  Admit date: 12/10/2019 Discharge date: 12/19/2019  Admitted From: Home Disposition:  Residential hospice   Brief/Interim Summary: Derrick [name] is an 86 year old man admitted from home on 3/4 with complaints of generalized weakness, lethargy, anorexia and abdominal pain for the past 2 weeks. Per son, patient has had decreased urinary output but no fevers or chills. His past medical history significant for a recent admission in December for sepsis secondary to acute cholecystitis status post percutaneous drainage by interventional radiology, hypertension, stage III chronic kidney disease, right renal cell carcinoma status post right nephrectomy. CT scan in the emergency department was concerning for left hydroureteronephrosis and signs of an upper UTI. Admission was requested for further evaluation and management.    Patient was found to have Enterococcus bacteremia from urinary source.  Infectious disease and urology were consulted.  Patient underwent echocardiogram which did not show any vegetation.  He was recommended to continue IV ampicillin through April 3.  Palliative care was also consulted given patient's advanced age and multiple medical comorbidities.  He developed some respiratory failure and had chest x-ray which revealed patchy bilateral infiltrates.  Patient was started on Lasix with improvement in his respiratory status.  His altered mental status improved, was found to have bilateral upper extremity plegia.  MRI cervical spine revealed severe cervical stenosis at C3-C4.  Neurosurgery was consulted.  Patient was determined to be high risk surgical candidate with low likelihood that it would provide meaningful benefit. Palliative care consulted and family decided to transition patient to hospice.   Discharge Diagnoses:  Principal Problem:   Enterococcal bacteremia Active  Problems:   HTN (hypertension)   Anemia   CKD (chronic kidney disease)   Sepsis (HCC)   RA (rheumatoid arthritis) (HCC)   Atrial fibrillation with RVR (HCC)   Hydronephrosis of left kidney   AMS (altered mental status)   Depression   Cholecystitis, S/P drain 09-2019   Goals of care, counseling/discussion   Palliative care by specialist   DNR (do not resuscitate)    Sepsis secondary to Enterococcus bacteremia Acute pulmonary edema Acute hypoxemic respiratory failure Severe cervical stenosis C3-C4 with resulting quadriplegia  Acute metabolic encephalopathy Atrial fibrillation with RVR Recent history of cholecystitis with percutaneous drainage Rheumatoid arthritis History of right renal cell carcinoma Hypokalemia Hypomagnesemia    Discharge Instructions   Allergies as of 12/19/2019      Reactions   Effexor [venlafaxine] Other (See Comments)   Sexual side affects   Lisinopril Cough   Paxil [paroxetine] Other (See Comments)   Sexual side affects   Viagra [sildenafil] Other (See Comments)   Chest discomfort   Wellbutrin [bupropion] Itching      Medication List    STOP taking these medications   acetaminophen 325 MG tablet Commonly known as: TYLENOL   aspirin 81 MG chewable tablet   atorvastatin 40 MG tablet Commonly known as: LIPITOR   clopidogrel 75 MG tablet Commonly known as: PLAVIX   diltiazem 30 MG tablet Commonly known as: CARDIZEM   docusate sodium 250 MG capsule Commonly known as: COLACE   feeding supplement (ENSURE ENLIVE) Liqd   feeding supplement (PRO-STAT SUGAR FREE 64) Liqd   finasteride 5 MG tablet Commonly known as: PROSCAR   Fish Oil 1000 MG Caps   hydroxychloroquine 200 MG tablet Commonly known as: PLAQUENIL   multivitamin with minerals Tabs tablet   nitroGLYCERIN 0.3 MG SL tablet Commonly  known as: NITROSTAT   ondansetron 4 MG tablet Commonly known as: ZOFRAN   pantoprazole 40 MG tablet Commonly known as: PROTONIX    polyethylene glycol 17 g packet Commonly known as: MIRALAX / GLYCOLAX   predniSONE 5 MG tablet Commonly known as: DELTASONE   sertraline 25 MG tablet Commonly known as: ZOLOFT   ZzzQuil 50 MG/30ML Liqd Generic drug: diphenhydrAMINE HCl       Allergies  Allergen Reactions  . Effexor [Venlafaxine] Other (See Comments)    Sexual side affects  . Lisinopril Cough  . Paxil [Paroxetine] Other (See Comments)    Sexual side affects  . Viagra [Sildenafil] Other (See Comments)    Chest discomfort  . Wellbutrin [Bupropion] Itching    Consultations:  ID  Urology  Palliative care   Neurosurgery   Procedures/Studies: CT HEAD WO CONTRAST  Result Date: 12/15/2019 CLINICAL DATA:  Focal neuro deficit.  Sepsis. EXAM: CT HEAD WITHOUT CONTRAST TECHNIQUE: Contiguous axial images were obtained from the base of the skull through the vertex without intravenous contrast. COMPARISON:  CT head 12/10/2019 FINDINGS: Brain: Advanced atrophy with ventricular enlargement unchanged. Extensive chronic microvascular ischemic change throughout the white matter unchanged. 2.5 x 3.5 cm arachnoid cyst left parietal convexity unchanged. Negative for acute infarct, hemorrhage, mass. Vascular: Negative for hyperdense vessel. Atherosclerotic calcification in the carotid and vertebral arteries. Skull: Negative Sinuses/Orbits: Paranasal sinuses clear.  No orbital lesion. Other: None IMPRESSION: No acute abnormality and no change from the recent CT Extensive atrophy and severe chronic white matter ischemia. Electronically Signed   By: Franchot Gallo M.D.   On: 12/15/2019 13:53   CT Head Wo Contrast  Result Date: 12/10/2019 CLINICAL DATA:  Lethargy.  Altered mental status. EXAM: CT HEAD WITHOUT CONTRAST TECHNIQUE: Contiguous axial images were obtained from the base of the skull through the vertex without intravenous contrast. COMPARISON:  September 17, 2019. FINDINGS: Brain: No acute intracranial hemorrhage, midline  shift or edema. Stable interval appearance of asymmetrically prominent CSF-density in the left anterior parietal lobe sulcus compared to December 2020, possibly an underlying 2-3 cm arachnoid cyst or prominent sulcus secondary to adjacent cerebral atrophy. Extensive white matter disease in the bilateral corona radiata and chronic left lacunar infarction. Stable ventriculomegaly proportionate to cerebral volume loss without hydrocephalus. Backspace Vascular: Circumferential calcified atherosclerosis of a dominant left vertebral artery and the bilateral carotid siphons. Skull: Diffuse bone demineralization. Stable 8 mm calvarial benign hemangioma in the right parietal bone. Diffuse skull demineralization. Sinuses/Orbits: Bilateral scleral banding. Mild bilateral ethmoidal mucosal thickening. Secretions in the nasal airway. Other: Chronic sclerosis in the dependent left mastoid air cells. No mastoid fluid bilaterally. IMPRESSION: No acute intracranial hemorrhage or edema. Redemonstration of extensive bilateral corona radiata white matter disease and stable interval appearance of prominent CSF-density in the left anterior parietal sulcus compared to December 2020. Electronically Signed   By: Revonda Humphrey   On: 12/10/2019 17:18   MR BRAIN WO CONTRAST  Result Date: 12/17/2019 CLINICAL DATA:  Generalized weakness EXAM: MRI HEAD WITHOUT CONTRAST TECHNIQUE: Multiplanar, multiecho pulse sequences of the brain and surrounding structures were obtained without intravenous contrast. COMPARISON:  2018 FINDINGS: Brain: There is no acute infarction or hemorrhage. Confluent areas of T2 hyperintensity in the supratentorial white matter are nonspecific but likely reflect advanced chronic microvascular ischemic changes. There is a chronic left thalamic infarct with chronic blood products. Prominence of ventricles and sulci reflects moderate to marked parenchymal volume loss. Appearance is similar. There is no intracranial mass,  mass effect,  or edema. Vascular: Major vessel flow voids at the skull base are preserved. Skull and upper cervical spine: Normal marrow signal is preserved. Sinuses/Orbits: Minor mucosal thickening.  Orbits are unremarkable. Other: Sella is unremarkable.  Mastoid air cells are clear. IMPRESSION: No acute infarction, hemorrhage, or mass. Stable chronic findings detailed above. Electronically Signed   By: Macy Mis M.D.   On: 12/17/2019 16:56   MR CERVICAL SPINE WO CONTRAST  Result Date: 12/17/2019 CLINICAL DATA:  Neck pain, upper extremity weakness EXAM: MRI CERVICAL SPINE WITHOUT CONTRAST TECHNIQUE: Multiplanar, multisequence MR imaging of the cervical spine was performed. No intravenous contrast was administered. COMPARISON:  CT 04/29/2018 FINDINGS: Technical note: Examination is significantly degraded by motion artifact. Alignment: 3 mm anterolisthesis C4 on C5. Trace retrolisthesis C5 on C6 and C6 on C7. Straightening and slight reversal of the cervical lordosis. Vertebrae: There is fluid signal within the C3-4 disc. Mild associated endplate marrow edema without definite bony destruction. There is prevertebral soft tissue edema along the anterior aspect of the cervical spine most prevalent from C2-C4 (series 5, image 11). It is difficult to assess if there is any epidural fluid given the degree of motion artifact at this level. No fracture identified. Cord: Although there is extensive motion degradation, there appears to be increased signal within the cord at the C3 and C4 levels. Posterior Fossa, vertebral arteries, paraspinal tissues: Cerebral and cerebellar atrophy. Vertebral artery flow voids are present. Disc levels: C2-C3: Disc osteophyte complex and bilateral facet arthropathy resulting in moderate canal stenosis and mild bilateral foraminal stenosis, right worse than left. C3-C4: Disc osteophyte complex with bilateral facet and uncovertebral arthropathy resulting in severe canal stenosis with  deformation of the cord. Mild-to-moderate right greater than left foraminal stenosis. C4-C5: Posterior disc osteophyte complex with bilateral facet and uncovertebral arthropathy resulting in mild-to-moderate canal stenosis and at least moderate bilateral foraminal stenosis. C5-C6: Posterior disc osteophyte complex with right worse than left facet and uncovertebral arthropathy resulting in mild canal stenosis and moderate bilateral foraminal stenosis. C6-C7: Posterior disc osteophyte complex and bilateral facet and uncovertebral arthropathy resulting in mild canal stenosis and left worse than right foraminal stenosis, likely at least moderate. C7-T1: Small posterior disc osteophyte complex without evidence of foraminal or canal stenosis. IMPRESSION: 1. Limited motion degradation examination. 2. Fluid signal within the C3-4 disc and prevertebral soft tissue edema along the anterior aspect of the cervical spine. Findings are concerning for discitis. No definite epidural fluid collection, although motion artifact significantly degrades evaluation of this level. A repeat pre and post-contrast MRI could be performed for further evaluation, as clinically indicated. 3. Severe C3-4 canal stenosis with cord compression. Increased signal within the cord at this level may represent cord edema or myelomalacia. 4. Severe cervical spondylosis with multilevel mild and moderate canal stenosis, as detailed above. These results will be called to the ordering clinician or representative by the Radiologist Assistant, and communication documented in the PACS or [employer]. Electronically Signed   By: Davina Poke D.O.   On: 12/17/2019 16:06   CT ABDOMEN PELVIS W CONTRAST  Result Date: 12/10/2019 CLINICAL DATA:  Sepsis. Lethargy. Recent gallbladder infection 6 weeks ago. EXAM: CT ABDOMEN AND PELVIS WITH CONTRAST TECHNIQUE: Multidetector CT imaging of the abdomen and pelvis was performed using the standard protocol following  bolus administration of intravenous contrast. CONTRAST:  64mL OMNIPAQUE IOHEXOL 300 MG/ML  SOLN COMPARISON:  September 16, 2019. FINDINGS: Lower chest: Mild cardiomegaly. Coarse aortic valvular calcification. Coronary calcification. Minimal subpleural atelectasis in  the lung bases. Hepatobiliary: A cholecystostomy pigtail drainage catheter coiled in a borderline dilated 4.2 cm gallbladder lumen. No gallbladder wall thickening or calcified gallstone. No intrahepatic biliary duct dilatation or intrahepatic abscess collection. Patient motion artifact without apparent dilatation of the common bile duct. Normally sized liver with smooth hepatic contours and no ascites. No para cholecystic fluid or abscess or biloma. Pancreas: Mild pancreatic atrophy. No dilatation of the main pancreatic duct. No sequela of acute or chronic pancreatitis. Spleen: Normal. Adrenals/Urinary Tract: Normal bilateral adrenal glands. Mild left hydroureteronephrosis to the level of the UVJ and abnormal enhancement of the proximal and mid ureteral uroepithelium with moderate perinephric and periureteral inflammatory change. No ureteral calcified calculus. Remote right nephrectomy. A punctate left renal upper pole nonobstructing calculus. Marked urine distension of the urinary bladder. Stomach/Bowel: Mild nonspecific thickening of the gastric fundus mucosa with nondistention of the gastric lumen in this region and unusual invaginated appearance of the posterior fundal mucosa, which could be secondary to physiologic contraction or an underlying gastric mucosal lesion. The mucosa had a normal appearance in this region on the December 2020 study. Nonobstructed bowel. Small stool burden. No apparent bowel wall thickening. Normal appendix on axial series 3, image 46. Vascular/Lymphatic: Thoracoabdominal aorta calcified atherosclerosis with borderline 3.0 cm fusiform dilatation of the proximal and mid abdominal aorta. No adenopathy. Reproductive: Enlarged 6  cm prostate with mass effect on the posterior urinary bladder. Septated left hydrocele measuring 10 x 5 by 8 cm. Other: Small periumbilical herniation of fat without strangulation or incarceration. Symmetrical sizes of the bilateral iliac psoas musculature, including along the right nephrectomy bed. Musculoskeletal: A 9 mm lytic lesion in the L1 left posterior vertebral body and relative lucency in the L4 left pedicle and bilateral L5 pedicles, unchanged. Diffuse bone demineralization and advanced skeletal degenerative changes. Lower lumbar posterior decompression and advanced bulky facet arthropathy. Healed right ninth, a, 7 rib fracture deformities. Chronic grade 1 spondylolisthesis of the lumbar spine, likely degenerative. IMPRESSION: Mild left hydroureteronephrosis to the level of the UVJ without an obstructing calcified ureteral calculus. Additional findings concerning for possible left upper urinary tract infection possible urinary outlet obstruction by an enlarged prostate. Status post right nephrectomy without adenopathy. Stable 9 mm L1 lytic lesion and relative lucency in the L4 left pedicle and bilateral L5 pedicles; MR lumbar spine without and with intravenous contrast could differentiate a benign from metastatic etiology as clinically appropriate. Thoracoabdominal aortic calcified atherosclerosis and 3.0 cm fusiform dilatation of the abdominal aorta. Recommend followup by ultrasound in 3 years. This recommendation follows ACR consensus guidelines: White Paper of the ACR Incidental Findings Committee II on Vascular Findings. J Am Coll Radiol 2013; 10:789-794. Aortic aneurysm NOS (ICD10-I71.9) Mild nonspecific gastritis in the fundal region which is nondistended and has an unusual invaginated appearance that could be secondary to physiologic contraction or interval development of an underlying gastric mucosal lesion since December 2020. Upper gastrointestinal barium fluoroscopy could be considered. Mild  cardiomegaly with coarse aortic valvular calcification. Large left septated hydrocele. Residual mild gallbladder dilatation with appropriate placement of a cholecystostomy tube and no complicating features. Electronically Signed   By: Revonda Humphrey   On: 12/10/2019 16:56   US RENAL  Result Date: 12/13/2019 CLINICAL DATA:  86 year old male with history of hydronephrosis noted on prior CT examination. Follow-up study. EXAM: RENAL / URINARY TRACT ULTRASOUND COMPLETE COMPARISON:  Abdominal ultrasound 09/16/2019. CT the abdomen and pelvis 12/10/2019. FINDINGS: Right Kidney: Status post right nephrectomy. Left Kidney: Renal measurements: 13.8 x 7.6  x 6.3 cm = volume: 348 mL. Echogenicity within normal limits. No mass or hydronephrosis visualized. Bladder: Foley balloon in the lumen of the urinary bladder. Urinary bladder remains moderately distended despite the presence of the Foley. Heterogeneous echogenicity noted dependently in the urinary bladder, which may represent debris or clot. Other: Prostate gland appears enlarged measuring 5.4 x 4.5 x 5.1 cm. IMPRESSION: 1. No residual left hydroureteronephrosis noted on today's examination. 2. Status post right nephrectomy. 3. Debris or clot lying dependently within the lumen of the urinary bladder. Urinary bladder is moderately distended despite the presence of an indwelling Foley catheter. 4. Mild prostatomegaly. Electronically Signed   By: Vinnie Langton M.D.   On: 12/13/2019 05:03   DG CHEST PORT 1 VIEW  Result Date: 12/17/2019 CLINICAL DATA:  Patient with respiratory failure. EXAM: PORTABLE CHEST 1 VIEW COMPARISON:  Chest radiograph 12/15/2019. FINDINGS: Right upper extremity PICC line is present with tip projecting over the superior vena cava. Monitoring leads overlie the patient. Stable enlarged cardiac and mediastinal contours. Small layering bilateral pleural effusions. Similar-appearing diffuse bilateral airspace opacities. No pneumothorax. IMPRESSION:  Similar-appearing diffuse bilateral airspace opacities and small bilateral pleural effusions. Electronically Signed   By: Lovey Newcomer M.D.   On: 12/17/2019 09:41   DG CHEST PORT 1 VIEW  Result Date: 12/15/2019 CLINICAL DATA:  Respiratory distress. Sepsis. EXAM: PORTABLE CHEST 1 VIEW COMPARISON:  12/10/2019 FINDINGS: The cardiac silhouette, mediastinal and hilar contours are within normal limits given the AP projection and portable technique. There is stable tortuosity and calcification of the thoracic aorta. Patchy ill-defined infiltrates. No definite pleural effusions. The bony thorax is intact IMPRESSION: Patchy bilateral infiltrates. Electronically Signed   By: Marijo Sanes M.D.   On: 12/15/2019 10:08   DG Chest Port 1 View  Result Date: 12/10/2019 CLINICAL DATA:  Sepsis, weakness EXAM: PORTABLE CHEST 1 VIEW COMPARISON:  09/16/2019 FINDINGS: The heart size and mediastinal contours are within normal limits. Calcific aortic knob. No focal airspace consolidation, pleural effusion, or pneumothorax. Degenerative changes of the bilateral shoulders. IMPRESSION: No active disease. Electronically Signed   By: Davina Poke D.O.   On: 12/10/2019 12:15   MYOCARDIAL PERFUSION IMAGING  Result Date: 11/20/2019  The left ventricular ejection fraction is normal (55-65%).  Nuclear stress EF: 57%.  No T wave inversion was noted during stress.  There was no ST segment deviation noted during stress.  This is a low risk study.  No ischemia. LVEF 57% with normal wall motion. This is a low risk study.   ECHOCARDIOGRAM COMPLETE  Result Date: 12/11/2019    ECHOCARDIOGRAM REPORT   Patient Name:   Derrick [name] Date of Exam: 12/11/2019 Medical Rec #:  [id number]   Height:       69.0 in Accession #:    [id number]  Weight:       161.8 lb Date of Birth:  December 02, 1931  BSA:          1.888 m Patient Age:    23 years    BP:           133/75 mmHg Patient Gender: M           HR:           103 bpm. Exam Location:  Inpatient  Procedure: 2D Echo Indications:    Bacteremia R78.81; Murmur R01.1  History:        Patient has prior history of Echocardiogram examinations, most  recent 09/19/2019. CAD; Risk Factors:Hypertension.  Sonographer:    Mikki Santee RDCS (AE) Referring Phys: [place]  1. Left ventricular ejection fraction, by estimation, is 70 to 75%. The left ventricle has hyperdynamic function. The left ventricle has no regional wall motion abnormalities. There is mild left ventricular hypertrophy of the basal-septal segment. Left ventricular diastolic function could not be evaluated. Left ventricular diastolic function could not be evaluated.  2. Right ventricular systolic function is normal. The right ventricular size is normal. There is normal pulmonary artery systolic pressure.  3. Left atrial size was moderately dilated.  4. The mitral valve is normal in structure and function. Mild to moderate mitral valve regurgitation. The MR is very eccentic and directed anteriorly.  5. The aortic valve is tricuspid. Aortic valve regurgitation is not visualized. FINDINGS  Left Ventricle: Left ventricular ejection fraction, by estimation, is 70 to 75%. The left ventricle has hyperdynamic function. The left ventricle has no regional wall motion abnormalities. The left ventricular internal cavity size was normal in size. There is mild left ventricular hypertrophy of the basal-septal segment. Left ventricular diastolic function could not be evaluated due to atrial fibrillation. Left ventricular diastolic function could not be evaluated. Right Ventricle: The right ventricular size is normal. No increase in right ventricular wall thickness. Right ventricular systolic function is normal. There is normal pulmonary artery systolic pressure. The tricuspid regurgitant velocity is 2.35 m/s, and  with an assumed right atrial pressure of 3 mmHg, the estimated right ventricular systolic pressure is [id number] mmHg.  Left Atrium: Left atrial size was moderately dilated. Right Atrium: Right atrial size was normal in size. Pericardium: There is no evidence of pericardial effusion. Mitral Valve: The mitral valve is normal in structure and function. Mild to moderate mitral valve regurgitation, with anteriorly-directed jet. Tricuspid Valve: The tricuspid valve is normal in structure. Tricuspid valve regurgitation is trivial. Aortic Valve: The aortic valve is tricuspid. Aortic valve regurgitation is not visualized. There is moderate calcification of the aortic valve. Pulmonic Valve: The pulmonic valve was not well visualized. Pulmonic valve regurgitation is trivial. Aorta: The aortic root is normal in size and structure. IAS/Shunts: The interatrial septum appears to be lipomatous. No atrial level shunt detected by color flow Doppler.  LEFT VENTRICLE PLAX 2D LVIDd:         3.50 cm LVIDs:         2.20 cm LV PW:         1.10 cm LV IVS:        1.10 cm LVOT diam:     2.10 cm LV SV:         61 LV SV Index:   32 LVOT Area:     3.46 cm  RIGHT VENTRICLE RV S prime:     13.60 cm/s TAPSE (M-mode): 1.5 cm LEFT ATRIUM             Index       RIGHT ATRIUM           Index LA diam:        3.10 cm 1.64 cm/m  RA Area:     14.90 cm LA Vol (A2C):   59.7 ml 31.62 ml/m RA Volume:   30.30 ml  16.05 ml/m LA Vol (A4C):   97.3 ml 51.54 ml/m LA Biplane Vol: 76.2 ml 40.36 ml/m  AORTIC VALVE LVOT Vmax:   107.00 cm/s LVOT Vmean:  64.200 cm/s LVOT VTI:    0.176 m  AORTA Ao  Root diam: 3.70 cm TRICUSPID VALVE TR Peak grad:   22.1 mmHg TR Vmax:        235.00 cm/s  SHUNTS Systemic VTI:  0.18 m Systemic Diam: 2.10 cm Glori Bickers MD Electronically signed by Glori Bickers MD Signature Date/Time: 12/11/2019/6:52:08 PM    Final    [place] EKG SITE RITE  Result Date: 12/15/2019 If Site Rite image not attached, placement could not be confirmed due to current cardiac rhythm.       Discharge Exam: Vitals:   12/18/19 2159 12/19/19 1129  BP:  (!) 155/92   Pulse:  (!) 104  Resp: (!) 28 18  Temp:  98.5 F (36.9 C)  SpO2:  96%    General: Pt is sleeping comfortably, full comfort care measures    The results of significant diagnostics from this hospitalization (including imaging, microbiology, ancillary and laboratory) are listed below for reference.     Microbiology: Recent Results (from the past 240 hour(s))  Blood Culture (routine x 2)     Status: Abnormal   Collection Time: 12/10/19 11:30 AM   Specimen: BLOOD  Result Value Ref Range Status   Specimen Description BLOOD RIGHT ANTECUBITAL  Final   Special Requests   Final    BOTTLES DRAWN AEROBIC AND ANAEROBIC Blood Culture adequate volume   Culture  Setup Time   Final    IN BOTH AEROBIC AND ANAEROBIC BOTTLES CRITICAL RESULT CALLED TO, READ BACK BY AND VERIFIED WITH: K AMEND PHARMD 12/11/19 0125 JDW GRAM POSITIVE COCCI Performed at [facility], [address] [place], [place] [address]    Culture ENTEROCOCCUS FAECALIS (A)  Final   Report Status 12/13/2019 FINAL  Final   Organism ID, Bacteria ENTEROCOCCUS FAECALIS  Final      Susceptibility   Enterococcus faecalis - MIC*    AMPICILLIN <=2 SENSITIVE Sensitive     VANCOMYCIN 1 SENSITIVE Sensitive     GENTAMICIN SYNERGY SENSITIVE Sensitive     * ENTEROCOCCUS FAECALIS  Blood Culture ID Panel (Reflexed)     Status: Abnormal   Collection Time: 12/10/19 11:30 AM  Result Value Ref Range Status   Enterococcus species DETECTED (A) NOT DETECTED Final    Comment: CRITICAL RESULT CALLED TO, READ BACK BY AND VERIFIED WITH: K AMEND PHARMD 12/11/19 0125 JDW    Vancomycin resistance NOT DETECTED NOT DETECTED Final   Listeria monocytogenes NOT DETECTED NOT DETECTED Final   Staphylococcus species NOT DETECTED NOT DETECTED Final   Staphylococcus aureus (BCID) NOT DETECTED NOT DETECTED Final   Streptococcus species NOT DETECTED NOT DETECTED Final   Streptococcus agalactiae NOT DETECTED NOT DETECTED Final   Streptococcus pneumoniae NOT  DETECTED NOT DETECTED Final   Streptococcus pyogenes NOT DETECTED NOT DETECTED Final   Acinetobacter baumannii NOT DETECTED NOT DETECTED Final   Enterobacteriaceae species NOT DETECTED NOT DETECTED Final   Enterobacter cloacae complex NOT DETECTED NOT DETECTED Final   Escherichia coli NOT DETECTED NOT DETECTED Final   Klebsiella oxytoca NOT DETECTED NOT DETECTED Final   Klebsiella pneumoniae NOT DETECTED NOT DETECTED Final   Proteus species NOT DETECTED NOT DETECTED Final   Serratia marcescens NOT DETECTED NOT DETECTED Final   Haemophilus influenzae NOT DETECTED NOT DETECTED Final   Neisseria meningitidis NOT DETECTED NOT DETECTED Final   Pseudomonas aeruginosa NOT DETECTED NOT DETECTED Final   Candida albicans NOT DETECTED NOT DETECTED Final   Candida glabrata NOT DETECTED NOT DETECTED Final   Candida krusei NOT DETECTED NOT DETECTED Final  Candida parapsilosis NOT DETECTED NOT DETECTED Final   Candida tropicalis NOT DETECTED NOT DETECTED Final    Comment: Performed at [facility], [place] [address] [place], [place] [address]  Blood Culture (routine x 2)     Status: Abnormal   Collection Time: 12/10/19 11:37 AM   Specimen: BLOOD LEFT HAND  Result Value Ref Range Status   Specimen Description BLOOD LEFT HAND  Final   Special Requests   Final    BOTTLES DRAWN AEROBIC AND ANAEROBIC Blood Culture adequate volume   Culture  Setup Time   Final    IN BOTH AEROBIC AND ANAEROBIC BOTTLES GRAM POSITIVE COCCI IN CHAINS CRITICAL VALUE NOTED.  VALUE IS CONSISTENT WITH PREVIOUSLY REPORTED AND CALLED VALUE.    Culture (A)  Final    ENTEROCOCCUS FAECALIS SUSCEPTIBILITIES PERFORMED ON PREVIOUS CULTURE WITHIN THE LAST 5 DAYS. Performed at [facility], [place] [address] [place], [place] [address]    Report Status 12/13/2019 FINAL  Final  Urine culture     Status: Abnormal   Collection Time: 12/10/19  1:50 PM   Specimen: In/Out Cath Urine  Result Value Ref Range Status   Specimen  Description IN/OUT CATH URINE  Final   Special Requests   Final    NONE Performed at [facility], [place] [address] [place], [place] [address]    Culture >=100,000 COLONIES/mL ENTEROCOCCUS FAECALIS (A)  Final   Report Status 12/12/2019 FINAL  Final   Organism ID, Bacteria ENTEROCOCCUS FAECALIS (A)  Final      Susceptibility   Enterococcus faecalis - MIC*    AMPICILLIN <=2 SENSITIVE Sensitive     NITROFURANTOIN <=16 SENSITIVE Sensitive     VANCOMYCIN 1 SENSITIVE Sensitive     * >=100,000 COLONIES/mL ENTEROCOCCUS FAECALIS  SARS CORONAVIRUS 2 (TAT 6-24 HRS) Nasopharyngeal Nasopharyngeal Swab     Status: None   Collection Time: 12/10/19  7:02 PM   Specimen: Nasopharyngeal Swab  Result Value Ref Range Status   SARS Coronavirus 2 NEGATIVE NEGATIVE Final    Comment: (NOTE) SARS-CoV-2 target nucleic acids are NOT DETECTED. The SARS-CoV-2 RNA is generally detectable in upper and lower respiratory specimens during the acute phase of infection. Negative results do not preclude SARS-CoV-2 infection, do not rule out co-infections with other pathogens, and should not be used as the sole basis for treatment or other patient management decisions. Negative results must be combined with clinical observations, patient history, and epidemiological information. The expected result is Negative. Fact Sheet for Patients: SugarRoll.be Fact Sheet for Healthcare Providers: https://www.woods-mathews.com/ This test is not yet approved or cleared by the [place] FDA and  has been authorized for detection and/or diagnosis of SARS-CoV-2 by FDA under an Emergency Use Authorization (EUA). This EUA will remain  in effect (meaning this test can be used) for the duration of the COVID-19 declaration under Section 56 4(b)(1) of the Act, 21 U.S.C. section 360bbb-3(b)(1), unless the authorization is terminated or revoked sooner. Performed at [facility],  [place] [address] [place], [place] [address]   Culture, blood (routine x 2)     Status: None   Collection Time: 12/12/19  5:16 AM   Specimen: BLOOD RIGHT HAND  Result Value Ref Range Status   Specimen Description BLOOD RIGHT HAND  Final   Special Requests   Final    BOTTLES DRAWN AEROBIC AND ANAEROBIC Blood Culture adequate volume   Culture   Final    NO GROWTH 5 DAYS  Performed at [facility], [place] [address], [place], [place] [address]    Report Status 12/17/2019 FINAL  Final  Culture, blood (routine x 2)     Status: None   Collection Time: 12/12/19  5:20 AM   Specimen: BLOOD LEFT HAND  Result Value Ref Range Status   Specimen Description BLOOD LEFT HAND  Final   Special Requests   Final    BOTTLES DRAWN AEROBIC AND ANAEROBIC Blood Culture adequate volume   Culture   Final    NO GROWTH 5 DAYS Performed at [facility], [place] [address] [place], [place] [address]    Report Status 12/17/2019 FINAL  Final     Labs: BNP (last 3 results) No results for input(s): BNP in the last 8760 hours. Basic Metabolic Panel: Recent Labs  Lab 12/15/19 0405 12/16/19 0352 12/16/19 1626 12/17/19 0444 12/17/19 2100 12/18/19 0449  NA 144 140  --  141 140 142  K 3.3* 3.0*  --  3.2* 3.7 3.4*  CL 117* 109  --  110 109 106  CO2 17* 22  --  24 22 24   GLUCOSE 160* 132*  --  122* 108* 102*  BUN 15 11  --  14 14 13   CREATININE 0.68 0.72  --  0.79 0.77 0.69  CALCIUM 8.1* 8.1*  --  8.0* 7.7* 8.0*  MG  --   --  1.4* 1.9  --  1.6*  PHOS  --   --  1.5*  --   --   --    Liver Function Tests: Recent Labs  Lab 12/14/19 0421 12/16/19 0352 12/17/19 2100  AST 43* 37 84*  ALT 48* 54* 79*  ALKPHOS 105 98 117  BILITOT 0.7 0.3 0.5  PROT 5.4* 5.2* 5.0*  ALBUMIN 1.6* 1.4* 1.4*   No results for input(s): LIPASE, AMYLASE in the last 168 hours. No results for input(s): AMMONIA in the last 168 hours. CBC: Recent Labs  Lab 12/15/19 0405 12/16/19 0352 12/17/19 0444 12/17/19 2100 12/18/19 0449   WBC 15.6* 15.2* 12.4* 13.3* 11.2*  NEUTROABS  --   --   --  11.1*  --   HGB 10.0* 9.1* 8.7* 9.0* 8.9*  HCT 32.7* 30.0* 28.8* 29.4* 29.2*  MCV 85.4 86.0 86.7 86.5 86.4  PLT 379 385 342 400 393   Cardiac Enzymes: No results for input(s): CKTOTAL, CKMB, CKMBINDEX, TROPONINI in the last 168 hours. BNP: Invalid input(s): POCBNP CBG: Recent Labs  Lab 12/15/19 0449 12/16/19 0620 12/17/19 0607 12/18/19 0610 12/19/19 0540  GLUCAP 139* 107* 117* 116* 95   D-Dimer No results for input(s): DDIMER in the last 72 hours. Hgb A1c No results for input(s): HGBA1C in the last 72 hours. Lipid Profile No results for input(s): CHOL, HDL, LDLCALC, TRIG, CHOLHDL, LDLDIRECT in the last 72 hours. Thyroid function studies No results for input(s): TSH, T4TOTAL, T3FREE, THYROIDAB in the last 72 hours.  Invalid input(s): FREET3 Anemia work up No results for input(s): VITAMINB12, FOLATE, FERRITIN, TIBC, IRON, RETICCTPCT in the last 72 hours. Urinalysis    Component Value Date/Time   COLORURINE AMBER (A) 12/10/2019 1628   APPEARANCEUR HAZY (A) 12/10/2019 1628   LABSPEC 1.028 12/10/2019 1628   PHURINE 5.0 12/10/2019 1628   GLUCOSEU NEGATIVE 12/10/2019 1628   HGBUR NEGATIVE 12/10/2019 1628   BILIRUBINUR NEGATIVE 12/10/2019 1628   KETONESUR NEGATIVE 12/10/2019 1628   PROTEINUR 100 (A) 12/10/2019 1628   UROBILINOGEN 1.0 11/27/2013 0947   NITRITE NEGATIVE 12/10/2019 1628  LEUKOCYTESUR NEGATIVE 12/10/2019 1628   Sepsis Labs Invalid input(s): PROCALCITONIN,  WBC,  LACTICIDVEN Microbiology Recent Results (from the past 240 hour(s))  Blood Culture (routine x 2)     Status: Abnormal   Collection Time: 12/10/19 11:30 AM   Specimen: BLOOD  Result Value Ref Range Status   Specimen Description BLOOD RIGHT ANTECUBITAL  Final   Special Requests   Final    BOTTLES DRAWN AEROBIC AND ANAEROBIC Blood Culture adequate volume   Culture  Setup Time   Final    IN BOTH AEROBIC AND ANAEROBIC BOTTLES CRITICAL  RESULT CALLED TO, READ BACK BY AND VERIFIED WITH: K AMEND PHARMD 12/11/19 0125 JDW GRAM POSITIVE COCCI Performed at [facility], [address], [place], [place] [address]    Culture ENTEROCOCCUS FAECALIS (A)  Final   Report Status 12/13/2019 FINAL  Final   Organism ID, Bacteria ENTEROCOCCUS FAECALIS  Final      Susceptibility   Enterococcus faecalis - MIC*    AMPICILLIN <=2 SENSITIVE Sensitive     VANCOMYCIN 1 SENSITIVE Sensitive     GENTAMICIN SYNERGY SENSITIVE Sensitive     * ENTEROCOCCUS FAECALIS  Blood Culture ID Panel (Reflexed)     Status: Abnormal   Collection Time: 12/10/19 11:30 AM  Result Value Ref Range Status   Enterococcus species DETECTED (A) NOT DETECTED Final    Comment: CRITICAL RESULT CALLED TO, READ BACK BY AND VERIFIED WITH: K AMEND PHARMD 12/11/19 0125 JDW    Vancomycin resistance NOT DETECTED NOT DETECTED Final   Listeria monocytogenes NOT DETECTED NOT DETECTED Final   Staphylococcus species NOT DETECTED NOT DETECTED Final   Staphylococcus aureus (BCID) NOT DETECTED NOT DETECTED Final   Streptococcus species NOT DETECTED NOT DETECTED Final   Streptococcus agalactiae NOT DETECTED NOT DETECTED Final   Streptococcus pneumoniae NOT DETECTED NOT DETECTED Final   Streptococcus pyogenes NOT DETECTED NOT DETECTED Final   Acinetobacter baumannii NOT DETECTED NOT DETECTED Final   Enterobacteriaceae species NOT DETECTED NOT DETECTED Final   Enterobacter cloacae complex NOT DETECTED NOT DETECTED Final   Escherichia coli NOT DETECTED NOT DETECTED Final   Klebsiella oxytoca NOT DETECTED NOT DETECTED Final   Klebsiella pneumoniae NOT DETECTED NOT DETECTED Final   Proteus species NOT DETECTED NOT DETECTED Final   Serratia marcescens NOT DETECTED NOT DETECTED Final   Haemophilus influenzae NOT DETECTED NOT DETECTED Final   Neisseria meningitidis NOT DETECTED NOT DETECTED Final   Pseudomonas aeruginosa NOT DETECTED NOT DETECTED Final   Candida albicans NOT DETECTED NOT  DETECTED Final   Candida glabrata NOT DETECTED NOT DETECTED Final   Candida krusei NOT DETECTED NOT DETECTED Final   Candida parapsilosis NOT DETECTED NOT DETECTED Final   Candida tropicalis NOT DETECTED NOT DETECTED Final    Comment: Performed at [facility], [place] [address] [place],  [address]  Blood Culture (routine x 2)     Status: Abnormal   Collection Time: 12/10/19 11:37 AM   Specimen: BLOOD LEFT HAND  Result Value Ref Range Status   Specimen Description BLOOD LEFT HAND  Final   Special Requests   Final    BOTTLES DRAWN AEROBIC AND ANAEROBIC Blood Culture adequate volume   Culture  Setup Time   Final    IN BOTH AEROBIC AND ANAEROBIC BOTTLES GRAM POSITIVE COCCI IN CHAINS CRITICAL VALUE NOTED.  VALUE IS CONSISTENT WITH PREVIOUSLY REPORTED AND CALLED VALUE.    Culture (A)  Final    ENTEROCOCCUS FAECALIS SUSCEPTIBILITIES PERFORMED ON  PREVIOUS CULTURE WITHIN THE LAST 5 DAYS. Performed at [facility], [place] [address], [place], [place] [address]    Report Status 12/13/2019 FINAL  Final  Urine culture     Status: Abnormal   Collection Time: 12/10/19  1:50 PM   Specimen: In/Out Cath Urine  Result Value Ref Range Status   Specimen Description IN/OUT CATH URINE  Final   Special Requests   Final    NONE Performed at [facility], [place] [address] [place], [place] [address]    Culture >=100,000 COLONIES/mL ENTEROCOCCUS FAECALIS (A)  Final   Report Status 12/12/2019 FINAL  Final   Organism ID, Bacteria ENTEROCOCCUS FAECALIS (A)  Final      Susceptibility   Enterococcus faecalis - MIC*    AMPICILLIN <=2 SENSITIVE Sensitive     NITROFURANTOIN <=16 SENSITIVE Sensitive     VANCOMYCIN 1 SENSITIVE Sensitive     * >=100,000 COLONIES/mL ENTEROCOCCUS FAECALIS  SARS CORONAVIRUS 2 (TAT 6-24 HRS) Nasopharyngeal Nasopharyngeal Swab     Status: None   Collection Time: 12/10/19  7:02 PM   Specimen: Nasopharyngeal Swab  Result Value Ref Range Status   SARS Coronavirus 2  NEGATIVE NEGATIVE Final    Comment: (NOTE) SARS-CoV-2 target nucleic acids are NOT DETECTED. The SARS-CoV-2 RNA is generally detectable in upper and lower respiratory specimens during the acute phase of infection. Negative results do not preclude SARS-CoV-2 infection, do not rule out co-infections with other pathogens, and should not be used as the sole basis for treatment or other patient management decisions. Negative results must be combined with clinical observations, patient history, and epidemiological information. The expected result is Negative. Fact Sheet for Patients: SugarRoll.be Fact Sheet for Healthcare Providers: https://www.woods-mathews.com/ This test is not yet approved or cleared by the [place] FDA and  has been authorized for detection and/or diagnosis of SARS-CoV-2 by FDA under an Emergency Use Authorization (EUA). This EUA will remain  in effect (meaning this test can be used) for the duration of the COVID-19 declaration under Section 56 4(b)(1) of the Act, 21 U.S.C. section 360bbb-3(b)(1), unless the authorization is terminated or revoked sooner. Performed at [facility], [place] [address] [place], [place] [address]   Culture, blood (routine x 2)     Status: None   Collection Time: 12/12/19  5:16 AM   Specimen: BLOOD RIGHT HAND  Result Value Ref Range Status   Specimen Description BLOOD RIGHT HAND  Final   Special Requests   Final    BOTTLES DRAWN AEROBIC AND ANAEROBIC Blood Culture adequate volume   Culture   Final    NO GROWTH 5 DAYS Performed at [facility], [place] [address] [place], [place] [address]    Report Status 12/17/2019 FINAL  Final  Culture, blood (routine x 2)     Status: None   Collection Time: 12/12/19  5:20 AM   Specimen: BLOOD LEFT HAND  Result Value Ref Range Status   Specimen Description BLOOD LEFT HAND  Final   Special Requests   Final    BOTTLES DRAWN AEROBIC AND ANAEROBIC  Blood Culture adequate volume   Culture   Final    NO GROWTH 5 DAYS Performed at [facility], [place] [address] [place], [place] [address]    Report Status 12/17/2019 FINAL  Final     Patient was seen and examined on the day of discharge and was found to be in stable condition. Time coordinating discharge: 89  minutes including assessment and coordination of care, as well as examination of the patient.   SIGNED:  Dessa Phi, DO [facility] 12/19/2019, 12:50 PM

## 2020-01-07 ENCOUNTER — Other Ambulatory Visit (HOSPITAL_COMMUNITY): Payer: Medicare Other

## 2020-01-07 DEATH — deceased

## 2020-01-11 ENCOUNTER — Ambulatory Visit: Admit: 2020-01-11 | Payer: Medicare Other | Admitting: General Surgery

## 2020-01-11 SURGERY — LAPAROSCOPIC CHOLECYSTECTOMY WITH INTRAOPERATIVE CHOLANGIOGRAM
Anesthesia: General

## 2020-01-18 ENCOUNTER — Other Ambulatory Visit (HOSPITAL_COMMUNITY): Payer: Medicare Other

## 2020-03-15 ENCOUNTER — Ambulatory Visit: Payer: Medicare Other | Admitting: Cardiovascular Disease
